# Patient Record
Sex: Male | Born: 1955 | Race: White | Hispanic: No | Marital: Married | Smoking: Never smoker
Health system: Southern US, Community
[De-identification: ages and names within clinical notes are randomized; demographics above are authoritative.]

## PROBLEM LIST (undated history)

## (undated) DIAGNOSIS — I219 Acute myocardial infarction, unspecified: Secondary | ICD-10-CM

## (undated) DIAGNOSIS — E782 Mixed hyperlipidemia: Secondary | ICD-10-CM

## (undated) DIAGNOSIS — I10 Essential (primary) hypertension: Secondary | ICD-10-CM

## (undated) DIAGNOSIS — I2510 Atherosclerotic heart disease of native coronary artery without angina pectoris: Secondary | ICD-10-CM

## (undated) DIAGNOSIS — K219 Gastro-esophageal reflux disease without esophagitis: Secondary | ICD-10-CM

## (undated) DIAGNOSIS — Z87442 Personal history of urinary calculi: Secondary | ICD-10-CM

## (undated) DIAGNOSIS — E119 Type 2 diabetes mellitus without complications: Secondary | ICD-10-CM

## (undated) DIAGNOSIS — F419 Anxiety disorder, unspecified: Secondary | ICD-10-CM

## (undated) HISTORY — DX: Acute myocardial infarction, unspecified: I21.9

## (undated) HISTORY — DX: Essential (primary) hypertension: I10

## (undated) HISTORY — PX: CAROTID STENT: SHX1301

## (undated) HISTORY — DX: Atherosclerotic heart disease of native coronary artery without angina pectoris: I25.10

## (undated) HISTORY — PX: INGUINAL HERNIA REPAIR: SUR1180

## (undated) HISTORY — DX: Mixed hyperlipidemia: E78.2

## (undated) HISTORY — DX: Type 2 diabetes mellitus without complications: E11.9

---

## 2001-11-21 ENCOUNTER — Ambulatory Visit (HOSPITAL_COMMUNITY): Admission: RE | Admit: 2001-11-21 | Discharge: 2001-11-21 | Payer: Self-pay | Admitting: Pulmonary Disease

## 2003-10-22 ENCOUNTER — Inpatient Hospital Stay (HOSPITAL_COMMUNITY): Admission: AD | Admit: 2003-10-22 | Discharge: 2003-10-24 | Payer: Self-pay | Admitting: Internal Medicine

## 2004-04-15 ENCOUNTER — Ambulatory Visit: Payer: Self-pay | Admitting: Cardiology

## 2004-04-25 ENCOUNTER — Ambulatory Visit: Payer: Self-pay | Admitting: Cardiology

## 2004-07-08 ENCOUNTER — Inpatient Hospital Stay (HOSPITAL_BASED_OUTPATIENT_CLINIC_OR_DEPARTMENT_OTHER): Admission: RE | Admit: 2004-07-08 | Discharge: 2004-07-08 | Payer: Self-pay | Admitting: Internal Medicine

## 2004-07-08 ENCOUNTER — Ambulatory Visit: Payer: Self-pay | Admitting: Internal Medicine

## 2004-07-22 ENCOUNTER — Ambulatory Visit: Payer: Self-pay | Admitting: Cardiology

## 2005-08-20 ENCOUNTER — Ambulatory Visit: Payer: Self-pay | Admitting: Cardiology

## 2006-02-02 ENCOUNTER — Ambulatory Visit: Payer: Self-pay | Admitting: Cardiology

## 2006-03-25 ENCOUNTER — Ambulatory Visit: Payer: Self-pay | Admitting: Cardiology

## 2007-01-21 ENCOUNTER — Ambulatory Visit: Payer: Self-pay | Admitting: Cardiology

## 2007-02-04 ENCOUNTER — Ambulatory Visit: Payer: Self-pay | Admitting: Cardiology

## 2007-10-25 ENCOUNTER — Ambulatory Visit (HOSPITAL_COMMUNITY): Admission: RE | Admit: 2007-10-25 | Discharge: 2007-10-25 | Payer: Self-pay | Admitting: Pulmonary Disease

## 2007-12-16 ENCOUNTER — Ambulatory Visit: Payer: Self-pay | Admitting: Cardiology

## 2009-05-14 ENCOUNTER — Ambulatory Visit (HOSPITAL_COMMUNITY): Admission: RE | Admit: 2009-05-14 | Discharge: 2009-05-14 | Payer: Self-pay | Admitting: Pulmonary Disease

## 2009-06-12 ENCOUNTER — Ambulatory Visit (HOSPITAL_COMMUNITY): Admission: RE | Admit: 2009-06-12 | Discharge: 2009-06-12 | Payer: Self-pay | Admitting: Orthopedic Surgery

## 2010-03-30 HISTORY — PX: CARDIAC CATHETERIZATION: SHX172

## 2010-03-30 HISTORY — PX: CORONARY ANGIOPLASTY: SHX604

## 2010-04-15 ENCOUNTER — Encounter: Payer: Self-pay | Admitting: Cardiology

## 2010-05-01 NOTE — Medication Information (Signed)
Summary: RX Folder/ DR. HAWKINS  RX Folder/ DR. HAWKINS   Imported By: Dorise Hiss 04/15/2010 15:51:54  _____________________________________________________________________  External Attachment:    Type:   Image     Comment:   External Document

## 2010-08-12 NOTE — Assessment & Plan Note (Signed)
[facility]                          [facility] NOTE   NAME:[name], [name] STROTHERS                       MRN:          [id number]  DATE:01/21/2007                            DOB:          25-Jan-1956    PRIMARY CARE PHYSICIAN:  [name] [name], M.D.   REASON FOR CONSULTATION:  Nine month followup.   HISTORY OF PRESENT ILLNESS:  [name] [name] is a 55 year old male patient  with a history of coronary artery disease, history of unstable angina,  status post stenting to the circumflex in July, 2005.  He had a drug-  eluting stent at that time, (Taxus).  He had a follow-up cardiac  catheterization as part of a Zomax trial in April, 2006.  He had a  patent stent with 40-50% in-stent restenosis.  He had moderate,  nonobstructive residual disease in the LAD and the circumflex.  He had  an 80-90% lesion in the first obtuse marginal, which was similar to his  previous films.  He also had an 80-90% lesion in the mid RCA which was  small and nondominant.  He has a history of normal LV function.  He  recently underwent lipoma excision from his left thigh.  This apparently  became infected, and he had to have this incised and drained.  He came  off of his Plavix for this procedure.  He did have some bleeding when he  went back on his Plavix, and it was eventually discontinued.  He  recently started back on aspirin about a month ago.   The patient return to the office today for followup.  Overall, he is  doing well.  His left leg wound is healing.  He denies any chest pain.  He denies any exertional chest heaviness or tightness.  He denies any  exertional shortness of breath.  He is NYHA class I-II.  He denies  orthopnea, PND, or pedal edema.  He denies any palpitations or syncope.   CURRENT MEDICATIONS:  1. Coreg 6.25 mg b.i.d.  2. Simvastatin 40 mg nightly.  3. Aspirin 81 mg daily.  4. Janumet 50/500 mg b.i.d.  5. Nitroglycerin p.r.n. chest pain.   ALLERGIES:  No known drug allergies.   PHYSICAL EXAMINATION:  He is a well-developed and well-nourished male in  no distress.  Blood pressure is 160/97, pulse 96, weight 219.2 pounds.  HEENT:  Normal.  NECK:  Without JVD at 45 degrees.  Carotids without bruits bilaterally.  CARDIAC:  Normal S1 and S2.  Regular rate and rhythm without murmurs.  LUNGS:  Clear to auscultation bilaterally without wheezes, rales or  rhonchi.  ABDOMEN:  Soft and nontender with normoactive bowel sounds.  No  organomegaly.  EXTREMITIES:  No edema.  Calves soft and nontender.  SKIN:  Warm and dry.  NEUROLOGIC:  He is alert and oriented x3.  Cranial nerves II-XII are  grossly intact.   Electrocardiogram reveals a sinus rhythm with a heart rate of 88.  Normal axis.  No acute changes.   IMPRESSION:  1. Coronary artery disease.      a.  Status post drug-eluting stent to the distal circumflex in       July, 2005 in the setting of unstable angina.      b.     Residual coronary artery disease, as outlined above.  2. Good left ventricular function.  3. Hypertension.  4. Diabetes mellitus.  5. Treated dyslipidemia.  6. Status post recent left thigh lipoma excision.   PLAN:  Patient presents to the office today for followup.  Overall, he  is doing well.  He denies any symptoms suggestive of angina.  His blood  pressure is uncontrolled.  He is now off Plavix since his recent leg  surgery.  At this point in time, we will plan to:  1. Continue off of Plavix, as he did take this for a total of two      years.  His aspirin will be increased to 325 mg daily.  2. Increase his Coreg to 12.5 mg b.i.d. for better blood pressure      control.  3. Have him follow up in the office with a nurse for a blood pressure      check in two weeks.  At that time, if his blood pressure will      tolerate it, we will initiate an ACE inhibitor.  He would benefit      from this, as he is a diabetic.  I would suggest starting       lisinopril 10 mg daily.  He will need follow-up BMET after that is      initiated.  4. Follow up in this office with Dr. Diona [name] in the next six months      or sooner p.r.n.      [name] Newcomer, PA-C  Electronically Signed      Jonelle Sidle, MD  Electronically Signed   SW/MedQ  DD: 01/21/2007  DT: 01/22/2007  Job #: [id number]   cc:   [name] [name], M.D.

## 2010-08-12 NOTE — Assessment & Plan Note (Signed)
[facility]                          [facility] NOTE   NAME:[name], [name] VICARIO                       MRN:          [id number]  DATE:12/16/2007                            DOB:          09-20-1955    PRIMARY CARE PHYSICIAN:  [name] Dredge L. Juanetta Gosling, MD   REASON FOR VISIT:  Routine cardiac followup.   HISTORY OF PRESENT ILLNESS:  [name] comes back in for a cardiac  visit.  He is not reporting any significant problems with angina or  limiting breathlessness, although he does feel basically fatigued he  admits when he has not been exercising with an irregularity and has also  gained weight over the last few years since his prior intervention.  He  is due to see Dr. Juanetta [name] soon for routine visit.  He has had had some  problems with sinus drainage and cough, but no lower extremity edema or  orthopnea.  I reviewed his medications, which are outlined below.  He  seems to be tolerating these well.  He does report some problems with  hyperglycemia.  He is not reporting any need for sublingual  nitroglycerin use.   ALLERGIES:  No known drug allergies.   PRESENT MEDICATIONS:  1. Aspirin 325 mg p.o. daily.  2. Coreg 12.5 mg p.o. b.i.d.  3. Actos/metformin 15/850 mg p.o. b.i.d.  4. Diovan 320 mg p.o. daily.  5. Humalog sliding scale.  6. Simvastatin 40 mg p.o. nightly.  7. Sublingual nitroglycerin 0.4 mg.   REVIEW OF SYSTEMS:  As per history of present illness.  Otherwise,  negative.   PHYSICAL EXAMINATION:  VITAL SIGNS:  Blood pressure 150/98, rechecked by  me 152/92; heart rate 81; weight 229 pounds, up from 219 pounds at his  last visit.  GENERAL:  This is an obese male in no acute distress.  NECK:  Examination of the neck reveals no elevated jugular venous  pressure.  No audible bruits.  LUNGS:  Clear without labored breathing at rest.  CARDIAC:  Regular rate and rhythm.  No S3, gallop, or pathologic murmur.  ABDOMEN:  Obese, nontender.  EXTREMITY:  Exhibit no frank pitting edema.   IMPRESSION AND RECOMMENDATIONS:  Coronary artery disease status post  drug-eluting stent placement to the distal circumflex in July 2005.  He  does have moderate residual disease within the left anterior descending  and circumflex as well as an 80-90% lesion involving a small nondominant  right coronary artery.  He is not reporting any angina or classic heart  failure symptoms.  He has had some fatigue.  I have recommended that he  get back to a regular exercise regimen, beginning with walking.  My hope  is that with increased activity and weight loss, he may note improvement  in many of his conditions.  Otherwise if he does not improve or begins  to develop angina, we may consider a followup Cardiolite.  We will plan  to see him back over the next 6 months.  Prescription refill for  sublingual nitroglycerin was given.     Jonelle Sidle, MD  Electronically Signed  SGM/MedQ  DD: 12/16/2007  DT: 12/17/2007  Job #: [id number]   cc:   [name] [name], M.D.

## 2010-08-15 NOTE — Assessment & Plan Note (Signed)
[facility]                            [facility] NOTE   NAME:[name] [name], [name] [name]                       MRN:          [id number]  DATE:                                      DOB:          May 07, 1955    PRIMARY CARE PHYSICIAN:  Oneal Deputy. Juanetta Gosling, M.D.   REASON FOR VISIT:  Recent lightheadedness and palpitations.   HISTORY OF PRESENT ILLNESS:  [name] [name] was last in the office back in May.  He has a history of coronary artery disease status post previous myocardial  infarction with subsequent stent placement to the circumflex coronary artery  in July of 2005. He has residual coronary disease including moderate  nonobstructive disease in the left anterior descending as well as an 80%  stenosis in a small nondominant right coronary artery, both of which are  being managed medically. Ejection fraction has been normal. He states that  he has actually been doing fairly well and has been able to lose 30 pounds  over the last several months through exercise and diet. He is in fact  walking up to 4 miles at a time on a treadmill and tolerating this quite  well. Last Thursday he states that he was feeling light-headed and  irregularity to his heart beat also noting it to be in the 60s through the  use of the pulse oximeter. 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 It is also possible that he is having  some Wenckebach conduction or nonconducted PACs. In any event, he noted that  since he has been exercising he does sweat fairly significantly with  activity and was not using any electrolyte replacement drinks. He started  using Propel and since then has not had any major palpitations. Of note, he  is not reporting any exertional chest pain or limiting dyspnea  on exertion  and has had no syncope. We reviewed his medications today and at this point  I have adopted a strategy of observation as well as basic blood work to  exclude major electrolyte abnormalities.   ALLERGIES:  No known drug allergies.   CURRENT MEDICATIONS:  1. Coreg 6.25 mg p.o. b.i.d.  2. Plavix 75 mg p.o. daily.  3. Simvastatin 40 mg p.o. daily.  4. Aspirin 81 mg p.o. daily.  5. Metformin 500 mg p.o. b.i.d.   REVIEW OF SYSTEMS:  As described in the history of present illness.   PHYSICAL EXAMINATION:  VITAL SIGNS:  Blood pressure today is elevated  150/68, heart rate is 72, weight is 200 pounds.  GENERAL:  The patient is comfortable and in no acute distress.  NECK:  Reveals no elevated jugular venous pressure or loud bruits. No  thyromegaly is noted.  LUNGS:  Clear without labored breathing.  CARDIAC:  Reveals a regular rate and rhythm with occasional ectopic beat, no  S3 gallop,  pericardial rub, or loud murmur.  EXTREMITIES:  No significant pitting edema.   IMPRESSION:  1. Recent palpitations and lightheadedness with premature atrial      complexes, possibly some nonconducted, and also prolonged PR interval      with questionable Wenckebach conduction at times. We will obtain a BMET      and magnesium to assess for electrolyte abnormalities. He is otherwise      reporting improvement in his symptoms since using an electrolyte      replacement drink after exercise and is not experiencing any exertional      chest pain or dyspnea, tolerating 4 miles of treadmill work at a time.      We will continue observation and have him followup in the office over      the next 3 weeks to ensure that things stabilize.  2. Known coronary artery disease as discussed above.     Jonelle Sidle, MD  Electronically Signed    SGM/MedQ  DD: 02/02/2006  DT: 02/03/2006  Job #: [id number]   cc:   Ramon Dredge L. Juanetta Gosling, M.D.

## 2010-08-15 NOTE — Discharge Summary (Signed)
NAME:  [name] [name], [name] [name] NO.:  [id number]   MEDICAL RECORD NO.:  [id number]                   PATIENT TYPE:  INP   LOCATION:  6532                                 FACILITY:  [facility]   PHYSICIAN:  Jonelle Sidle, M.D. [facility]        DATE OF BIRTH:  [birth year]   DATE OF ADMISSION:  10/22/2003  DATE OF DISCHARGE:  10/24/2003                           DISCHARGE SUMMARY - REFERRING   PROCEDURE:  Coronary angiogram/stent circumflex artery October 23, 2003.   REASON FOR ADMISSION:  Please refer to Dr. Illene Bolus McDowell's consultation  note for full details.   LABORATORY DATA:  Hemoglobin 14.5, platelets 267 at discharge.  BUN 15,  creatinine 0.9, potassium 3.6 at discharge.  Hemoglobin A1C 12.3.  Serial  cardiac enzymes normal.   Admission chest x-ray:  No acute disease.   HOSPITAL COURSE:  Following initial presentation to [facility] for  evaluation of chest pain and where patient ruled out for MI with negative  cardiac markers, arrangements were made by Dr. Simona Huh for transfer for  diagnostic coronary angiography.   Cardiac catheterization, performed by Dr. Charlies Constable (see report for full  details), revealed significant two vessel coronary artery disease with a  high grade distal circumflex lesion and an 80% small mid RCA stenosis.  The  LAD had 40% lesion of the proximal and mid portion; there was also 90%  second obtuse marginal (small), and 50% mid circumflex disease.  LV function  was normal.   Dr. Juanda Chance proceeded with successful stenting (TAXUS), as part of the ZOMAXX  trial, with no noted complications.   The patient was also started on Zocor and will need follow-up fasting  lipids/liver profile in approximately eight weeks.  The patient also has  newly diagnosed type 2 diabetes mellitus, with a prescription to start  Glucophage at home, and was instructed to start this in 48 hours  postoperatively from discharge.   DISCHARGE  MEDICATIONS:  1. Plavix 75 mg daily (x1 year).  2. Enteric coated aspirin 325 mg daily.  3. Ziac 2.5 mg daily.  4. Zocor 40 mg daily.  5. Glucophage as directed.  6. Nitrostat 0.4 mg p.r.n.   DISCHARGE INSTRUCTIONS:  No heavy lifting/driving x2 days.  Maintain low  fat/cholesterol diet.  Call the office if there is any swelling/bleeding of  the groin.   The patient will follow up with Dr. Doreatha Martin McDowell/[employer], on  Wednesday, November 07, 2003, at 1 p.m., at [facility] [place]   The patient also instructed to follow up with Dr. Shaune Pollack in [place],  [place], as previous scheduled.   DISCHARGE DIAGNOSES:  1. Status post unstable angina pectoris.     a. Negative serial cardiac markers.     b. Status post stent (TAXUS) distal circumflex (ZOMAXX study).     c. Residual second obtuse marginal and right coronary artery disease -  medical therapy recommended.     d. Normal left ventricular function.  2. Type 2 diabetes mellitus.  3. Hypertension.  4. Dyslipidemia.      Gene Serpe, P.A. [facility]                      Jonelle Sidle, M.D. [facility]    GS/MEDQ  D:  10/24/2003  T:  10/24/2003  Job:  [id number]   cc:   Regina Eck, M.D.  Jonita [place], [place]   [facility]  [address]  [address]  [place], [place] [address]

## 2010-08-15 NOTE — Assessment & Plan Note (Signed)
[facility]                          [facility] NOTE   NAME:Lien, DRADEN COTTINGHAM                       MRN:          [id number]  DATE:03/25/2006                            DOB:          04-07-1955    FOLLOW-UP VISIT:  Primary care physician is Oneal Deputy. Juanetta Gosling, M.D.   REASON FOR VISIT:  Cardiac follow-up.   HISTORY OF PRESENT ILLNESS:  I saw Mr. Mundorf back in November.  He had  been experiencing some palpitations and lightheadedness at that time  with some premature atrial complexes and possible Wenckebach conduction  noted.  We did not make any major medication changes but did check  baseline blood work showing normal BUN and creatinine of 14 and 0.8,  normal potassium of 4.4, and a normal magnesium of 2.2.  I encouraged  him to continue with his regular exercise regimen and maintain a well-  hydrated status.  He denies having any problems with exertional chest  pain or dyspnea and states that he actually feels much better now in  follow-up.   ALLERGIES:  No known drug allergies.   PRESENT MEDICATIONS:  1. Coreg 6.25 mg p.o. b.i.d.  2. Plavix 75 mg p.o. daily.  3. Zocor 40 mg p.o. daily.  4. Aspirin 81 mg p.o. daily.  5. Metformin 500 mg p.o. b.i.d.  6. Advil p.r.n.   REVIEW OF SYSTEMS:  As described in history of present illness.  He has  had no syncope.   PHYSICAL EXAMINATION:  VITAL SIGNS:  Blood pressure 144/90, heart rate  68, weight is 195 pounds.  GENERAL:  The patient is comfortable, in no acute distress.  NECK:  No elevated jugular venous pressure, without bruits.  LUNGS:  Clear without labored breathing.  CARDIAC:  A regular rate and rhythm without rub, murmur or gallop.  EXTREMITIES:  No pitting edema.   IMPRESSION:  1. History of coronary artery disease, status post previous stent      placement to the circumflex in July 2005 (in the [facility] trial).  He      had a follow-up catheterization in 2006 which revealed a  patent      stent with 40-50% restenosis, 80-90% stenosis in the midportion of      a small and nondominant right coronary artery, and approximately 50-      60% stenosis of the left anterior descending.  We will plan to      continue his medical therapy and have follow-up over the next      6 months.  No changes to his medical regimen at this time.  2. Further plans to follow.     Jonelle Sidle, MD  Electronically Signed    SGM/MedQ  DD: 03/25/2006  DT: 03/25/2006  Job #: [id number]   cc:   Ramon Dredge L. Juanetta Gosling, M.D.

## 2010-08-15 NOTE — Cardiovascular Report (Signed)
NAME:  [name] [name], [name] [name] NO.:  [id number]   MEDICAL RECORD NO.:  [id number]                   PATIENT TYPE:  INP   LOCATION:  2013                                 FACILITY:  [facility]   PHYSICIAN:  Charlies Constable, M.D. [facility]              DATE OF BIRTH:  [birth year]   DATE OF PROCEDURE:  10/23/2003  DATE OF DISCHARGE:                              CARDIAC CATHETERIZATION   PROCEDURES:  Cardiac catheterization.   CARDIOLOGIST:  Charlies Constable, M.D.   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  He has no prior history of heart  disease, but in church on Sunday, he had an episode of severe substernal  chest tightness with diaphoresis.  He was seen by Arnette Felts and Dr. Simona Huh and admitted to the hospital with the diagnosis of unstable angina.   DESCRIPTION OF PROCEDURE:  The procedure was performed via the right femoral  artery using arterial sheath and 6-French preformed coronary catheters.  A  frontal arterial puncture was performed, and Omnipaque contrast was used.  At completion of the diagnostic study, made decision for intervention on the  distal circumflex artery.   The patient was enrolled din the ZoMax trial and was randomized to ZoMax  study stent.  The patient was given weight-adjusted heparin to prolong the  ACT to greater than 200 seconds and was given double bolus Integrilin and  infusion and 300 mg of Plavix.  We used the CLS-4 6-French guiding catheter  with side holes and a Asahi wire.  We crossed the lesion in the distal  circumflex artery with the wire without too much difficulty.  We pre-dilated  with a 2.25 x 15 mm Maverick, performing two inflations up to 8 atmospheres  for 30 seconds.  We then deployed a 2.5 x 20 mm study stent, deploying this  with one inflation of 11 atmospheres for 30 seconds.   The distal portion of  the stent was not completely expanded with delivery balloon.  We went in and  post dilated with a 2.75 x 15 mm Quantum Maverick in the proximal portion of  the stent, avoiding the proximal edge.  We then post dilated with a 2.5 x 12  mm Quantum Maverick in the distal portion of the stent, performing one  inflation up to 18 atmospheres for 30 seconds.  We were unable to advance  the initial stent down to the distal segment of the stent.  Repeat  diagnostic studies were then performed through the guiding catheter.  The  patient tolerated the procedure well and left the laboratory in satisfactory  condition.   The patient was enrolled in the ZoMax trial.  We initially had planned to  IVUS the stent implantation, but after completing the stent procedure, we  felt that the distal vessel was too short and too small caliber distally to  tolerate the IVUS catheter.  Fro this reason, the IVUS was not performed.   RESULTS:  The aortic pressure was 124/89 with a mean of 106.  The left  ventricular pressure was 124/16.   1. LEFT MAIN CORONARY ARTERY:  The left main coronary artery was free fo     significant disease.  2. LEFT ANTERIOR DESCENDING ARTERY:  The left anterior descending artery     gave rise to a septal perforator, diagonal branch, and three small septal     perforators.  There was 40% proximal and 40% mid stenoses and     irregularities distally.  3. CIRCUMFLEX ARTERY:  The circumflex artery gave rise to a ramus branch, a     small marginal branch, a large posterolateral branch, and a posterior     descending branch.  There was 90% stenosis in the second marginal branch,     but this was a very small vessel.  There was 50% narrowing in the mid to     distal vessel before the posterolateral branch, and there was a 95%     stenosis in the distal vessel before the posterior descending branch.     There was 50% narrowing distally in the posterior descending  branch.  4. RIGHT CORONARY ARTERY:  The right coronary artery was a nondominant     vessel that gave rise to two right ventricular branches.  There was 80%     proximal stenosis.  5. LEFT VENTRICULOGRAM: The left ventriculogram performed in the RAO     projection showed good wall motion with no areas of hypokinesis.   Following stenting of the lesion in the distal circumflex artery, the  stenosis improved from 95% to less than 10%.   CONCLUSION:  1. Coronary artery disease with 40% proximal and 40% mid stenoses in the     left anterior descending artery, 95% stenosis in the distal circumflex     artery (dominant vessel), and 80% stenosis in a small, nondominant right     coronary artery with normal left ventricular function.  2. Successful stenting of the distal circumflex artery using a ZoMax study     drug-eluting tent with improvement in the narrowing from 95% to less than     10%.   DISPOSITION:  The patient returned to the holding area for further  observation.  The patient should remain on Plavix for one year and, as part  of the ZoMax trial, is targeted to have a followup catheterization at nine  months.                                               Charlies Constable, M.D. [facility]    BB/MEDQ  D:  10/23/2003  T:  10/23/2003  Job:  [id number]   cc:   Jonelle Sidle, M.D. [facility]   Carolynn Comment, M.D.   Cardiopulmonary Lab

## 2010-08-15 NOTE — Cardiovascular Report (Signed)
[name] [name], [name] [name]                ACCOUNT NO.:  [id number]   MEDICAL RECORD NO.:  [id number]          PATIENT TYPE:  OIB   LOCATION:  6501                         FACILITY:  [facility]   PHYSICIAN:  Arvilla Meres, M.D. LHCDATE OF BIRTH:  04-19-1955   DATE OF PROCEDURE:  07/08/2004  DATE OF DISCHARGE:                              CARDIAC CATHETERIZATION   REFERRED BY:  Dr. Linna Darner.   PROCEDURES PERFORMED:  1.  Selective coronary angiography.  2.  Left heart catheterization.  3.  Left ventriculogram.  4.  Angio-Seal closure device.   CARDIOLOGIST:  Jonelle Sidle, M.D. and Charlies Constable, M.D.   OPERATOR:  Arvilla Meres, M.D.   INDICATIONS FOR PROCEDURE:  [name] [name] is a 55 year old male with a history  of coronary artery disease, status post percutaneous intervention and  stenting of the left circumflex on October 23, 2003, who returns for a follow-  up angiography as part of the Zomax-X study.   DESCRIPTION OF PROCEDURE:  The risks and benefits of the catheterization  were reviewed with [name] [name].  A consent was signed and placed on the  chart.  The right groin area was prepped and draped in the routine sterile  fashion, and a 6-French arterial sheath was placed using a modified  Seldinger technique.  The standard catheters were used for angiography,  including a pre-formed Judkins JL4 and JR4 and a bent pigtail.  All catheter  exchanges were made over a wire.  There were no apparent complications.  At  the end of the case an angio-Seal closure device was placed in the right  groin, with a successful deployment and no evidence of groin complications.  Of note, per study protocol, 200 mcg of IV nitroglycerin were given prior to  angiography of the left circumflex system.   FINDINGS:  Central aortic pressure:  146/87 with a mean of 114.  LV pressure:  136/9 with an EDP of 15.   CORONARY ANATOMY:  1.  LEFT MAIN CORONARY ARTERY:  The left main coronary artery was normal.  2.   LEFT ANTERIOR DESCENDING CORONARY ARTERY:  The left anterior descending      coronary artery was a long vessel, extending to the apex.  It gave off a      large first diagonal branch and several small septal perforators.  There      is a 30% proximal lesion, followed by a 60% lesion just before the      diagonal.  This area was significant calcified.  There is a 30% mid-      lesion and diffuse distal disease with a 50%-60% lesion at the apex.      There is a 50% lesion in the diagonal.  3.  LEFT CIRCUMFLEX CORONARY ARTERY:  The left circumflex system was a large      system that was dominant.  It gave off a ramus branch, as well as two      marginals and several posterolaterals.  In the distal AV groove      circumflex there was a 50%-60% lesion.  In the ramus  there was a 60%      lesion in the proximal portion.  In the OM-I, which is a small vessel,      there was a tandem 80%-90% lesion which was visualized previously.  In      the distal circumflex, there was evidence of a previously-placed stent.      There was a 50% step-in lesion, followed by a 40%-50% restenosis within      the stent itself.  There was no flow-limiting disease there.  4.  RIGHT CORONARY ARTERY:  The right coronary artery was a small non-      dominant vessel.  There was an 80%-90% lesion in the mid-portion.   LEFT VENTRICULOGRAM:  The left ventriculogram shot in the RAO approach had  an ejection fraction of 60%-65% with no wall motion abnormalities or mitral  regurgitation.   ASSESSMENT:  1.  Left circumflex stent is patent, with a 40%-50% restenosis within the      stent and a 50% step-in.  2.  There is a question of mild progression of proximal left anterior      descending coronary artery disease.   PLAN:  Will plan to review films with Dr. Charlies Constable for further followup.      DB/MEDQ  D:  07/08/2004  T:  07/08/2004  Job:  [id number]   cc:   Dr. Jill Poling, M.D. [facility]   Jonelle Sidle,  M.D. [facility]   [facility]  -  [place], [place]

## 2010-08-15 NOTE — Letter (Signed)
October 19, 2006    Sherilyn Cooter A. Cleotis Nipper, M.D.  [address]  [place], [place] [address]   RE:  BENTLEIGH, STANKUS  MRN:  [id number]  /  DOB:  Feb 20, 1956   Dear Dr. Cleotis Nipper,   I have reviewed the chart on Mr. Tremar Wickens.  This patient is followed  closely in our clinic.  He is a 55 year old male with a history of  coronary artery disease, status post stent placement in July 2005.  The  patient had a follow-up catheterization in 2006 which showed a patent  stent.  I understand he is now scheduled for surgery.  The patient has  been more than a year on Plavix, and Plavix can now temporarily be  discontinued in consideration of surgery.  I would recommend that the  patient gets restarted on Plavix whenever you feel it is appropriate  after his surgery.  The risk for late stent thrombosis in the setting of  withholding briefly Plavix is acceptably low, particularly in light of  the patient needing surgery.   If you have any further questions, please do not hesitate to contact me.    Sincerely,      Learta Codding, MD,FACC  Electronically Signed    GED/MedQ  DD: 10/20/2006  DT: 10/20/2006  Job #: [id number]

## 2010-09-30 ENCOUNTER — Inpatient Hospital Stay (HOSPITAL_COMMUNITY)
Admission: EM | Admit: 2010-09-30 | Discharge: 2010-10-04 | DRG: 246 | Disposition: A | Discharge status: Home / Self Care | Payer: 59 | Source: Ambulatory Visit | Attending: Cardiovascular Disease | Admitting: Cardiovascular Disease

## 2010-09-30 ENCOUNTER — Emergency Department (HOSPITAL_COMMUNITY): Payer: 59

## 2010-09-30 DIAGNOSIS — I2510 Atherosclerotic heart disease of native coronary artery without angina pectoris: Secondary | ICD-10-CM | POA: Diagnosis present

## 2010-09-30 DIAGNOSIS — IMO0001 Reserved for inherently not codable concepts without codable children: Secondary | ICD-10-CM | POA: Diagnosis present

## 2010-09-30 DIAGNOSIS — R079 Chest pain, unspecified: Secondary | ICD-10-CM

## 2010-09-30 DIAGNOSIS — E785 Hyperlipidemia, unspecified: Secondary | ICD-10-CM | POA: Diagnosis present

## 2010-09-30 DIAGNOSIS — R570 Cardiogenic shock: Secondary | ICD-10-CM | POA: Diagnosis present

## 2010-09-30 DIAGNOSIS — I498 Other specified cardiac arrhythmias: Secondary | ICD-10-CM | POA: Diagnosis present

## 2010-09-30 DIAGNOSIS — E669 Obesity, unspecified: Secondary | ICD-10-CM | POA: Diagnosis present

## 2010-09-30 DIAGNOSIS — Z9861 Coronary angioplasty status: Secondary | ICD-10-CM

## 2010-09-30 DIAGNOSIS — I4901 Ventricular fibrillation: Secondary | ICD-10-CM | POA: Diagnosis present

## 2010-09-30 DIAGNOSIS — I10 Essential (primary) hypertension: Secondary | ICD-10-CM | POA: Diagnosis present

## 2010-09-30 DIAGNOSIS — I2109 ST elevation (STEMI) myocardial infarction involving other coronary artery of anterior wall: Principal | ICD-10-CM | POA: Diagnosis present

## 2010-09-30 DIAGNOSIS — I469 Cardiac arrest, cause unspecified: Secondary | ICD-10-CM | POA: Diagnosis present

## 2010-09-30 DIAGNOSIS — Z7982 Long term (current) use of aspirin: Secondary | ICD-10-CM

## 2010-09-30 LAB — CBC
HCT: 46 % (ref 39.0–52.0)
Platelets: 275 10*3/uL (ref 150–400)
Platelets: 214 10*3/uL (ref 150–400)
RDW: 13.2 % (ref 11.5–15.5)
RDW: 13.1 % (ref 11.5–15.5)
WBC: 11.4 10*3/uL — ABNORMAL HIGH (ref 4.0–10.5)
WBC: 10.7 10*3/uL — ABNORMAL HIGH (ref 4.0–10.5)

## 2010-09-30 LAB — COMPREHENSIVE METABOLIC PANEL
AST: 59 U/L — ABNORMAL HIGH (ref 0–37)
Albumin: 3.8 g/dL (ref 3.5–5.2)
Alkaline Phosphatase: 59 U/L (ref 39–117)
BUN: 21 mg/dL (ref 6–23)
CO2: 19 mEq/L (ref 19–32)
Chloride: 100 mEq/L (ref 96–112)
Chloride: 99 mEq/L (ref 96–112)
Creatinine, Ser: 0.9 mg/dL (ref 0.50–1.35)
Creatinine, Ser: 0.74 mg/dL (ref 0.50–1.35)
GFR calc non Af Amer: >60 mL/min (ref >60)
Potassium: 4.6 mEq/L (ref 3.5–5.1)
Total Bilirubin: 0.4 mg/dL (ref 0.3–1.2)
Total Bilirubin: 0.7 mg/dL (ref 0.3–1.2)

## 2010-09-30 LAB — APTT: aPTT: 27 seconds (ref 24–37)

## 2010-09-30 LAB — PROTIME-INR: INR: 0.96 (ref 0.00–1.49)

## 2010-09-30 LAB — POCT I-STAT, CHEM 8
Calcium, Ion: 1.17 mmol/L (ref 1.12–1.32)
Chloride: 105 mEq/L (ref 96–112)
HCT: 50 % (ref 39.0–52.0)
Sodium: 138 mEq/L (ref 135–145)
TCO2: 25 mmol/L (ref 0–100)

## 2010-09-30 LAB — CK TOTAL AND CKMB (NOT AT ARMC): Total CK: 694 U/L — ABNORMAL HIGH (ref 7–232)

## 2010-09-30 LAB — MRSA PCR SCREENING: MRSA by PCR: NEGATIVE

## 2010-09-30 LAB — GLUCOSE, CAPILLARY
Glucose-Capillary: 214 mg/dL — ABNORMAL HIGH (ref 70–99)
Glucose-Capillary: 249 mg/dL — ABNORMAL HIGH (ref 70–99)

## 2010-09-30 LAB — HEMOGLOBIN A1C
Hgb A1c MFr Bld: 8.7 % — ABNORMAL HIGH (ref <5.7)
Mean Plasma Glucose: 203 mg/dL — ABNORMAL HIGH (ref <117)

## 2010-09-30 LAB — LIPID PANEL
LDL Cholesterol: 79 mg/dL (ref 0–99)
Triglycerides: 94 mg/dL (ref <150)
VLDL: 19 mg/dL (ref 0–40)

## 2010-09-30 LAB — POCT ACTIVATED CLOTTING TIME: Activated Clotting Time: 144 seconds

## 2010-09-30 LAB — TSH: TSH: 0.592 u[IU]/mL (ref 0.350–4.500)

## 2010-10-01 ENCOUNTER — Inpatient Hospital Stay (HOSPITAL_COMMUNITY): Payer: 59

## 2010-10-01 DIAGNOSIS — I2109 ST elevation (STEMI) myocardial infarction involving other coronary artery of anterior wall: Secondary | ICD-10-CM

## 2010-10-01 DIAGNOSIS — E1165 Type 2 diabetes mellitus with hyperglycemia: Secondary | ICD-10-CM

## 2010-10-01 LAB — BASIC METABOLIC PANEL
BUN: 21 mg/dL (ref 6–23)
BUN: 21 mg/dL (ref 6–23)
Calcium: 9 mg/dL (ref 8.4–10.5)
Chloride: 99 mEq/L (ref 96–112)
Creatinine, Ser: 0.79 mg/dL (ref 0.50–1.35)
GFR calc Af Amer: >60 mL/min (ref >60)
GFR calc Af Amer: >60 mL/min (ref >60)
GFR calc non Af Amer: >60 mL/min (ref >60)
Potassium: 3.7 mEq/L (ref 3.5–5.1)

## 2010-10-01 LAB — CBC
MCH: 29.3 pg (ref 26.0–34.0)
MCHC: 33.4 g/dL (ref 30.0–36.0)
MCV: 87.6 fL (ref 78.0–100.0)
Platelets: 229 10*3/uL (ref 150–400)
Platelets: 208 10*3/uL (ref 150–400)
RBC: 4.87 MIL/uL (ref 4.22–5.81)
RBC: 4.75 MIL/uL (ref 4.22–5.81)
RDW: 13.4 % (ref 11.5–15.5)
WBC: 8.8 10*3/uL (ref 4.0–10.5)

## 2010-10-01 LAB — CARDIAC PANEL(CRET KIN+CKTOT+MB+TROPI)
CK, MB: 24.4 ng/mL (ref 0.3–4.0)
CK, MB: 7.7 ng/mL (ref 0.3–4.0)
Total CK: 606 U/L — ABNORMAL HIGH (ref 7–232)
Troponin I: 14.13 ng/mL (ref <0.30)
Troponin I: 5.77 ng/mL (ref <0.30)
Troponin I: 5.67 ng/mL (ref <0.30)

## 2010-10-01 LAB — GLUCOSE, CAPILLARY: Glucose-Capillary: 289 mg/dL — ABNORMAL HIGH (ref 70–99)

## 2010-10-02 DIAGNOSIS — I517 Cardiomegaly: Secondary | ICD-10-CM

## 2010-10-02 LAB — BASIC METABOLIC PANEL
BUN: 19 mg/dL (ref 6–23)
CO2: 29 mEq/L (ref 19–32)
Chloride: 99 mEq/L (ref 96–112)
Creatinine, Ser: 0.74 mg/dL (ref 0.50–1.35)
Glucose, Bld: 181 mg/dL — ABNORMAL HIGH (ref 70–99)

## 2010-10-02 LAB — CBC
MCH: 28.6 pg (ref 26.0–34.0)
MCV: 86.8 fL (ref 78.0–100.0)
Platelets: 209 10*3/uL (ref 150–400)
RDW: 13.4 % (ref 11.5–15.5)
WBC: 8.1 10*3/uL (ref 4.0–10.5)

## 2010-10-02 LAB — POCT ACTIVATED CLOTTING TIME: Activated Clotting Time: 226 seconds

## 2010-10-03 LAB — COMPREHENSIVE METABOLIC PANEL
AST: 22 U/L (ref 0–37)
Albumin: 3.2 g/dL — ABNORMAL LOW (ref 3.5–5.2)
Alkaline Phosphatase: 53 U/L (ref 39–117)
Chloride: 99 mEq/L (ref 96–112)
Potassium: 3.6 mEq/L (ref 3.5–5.1)
Total Bilirubin: 0.7 mg/dL (ref 0.3–1.2)

## 2010-10-03 LAB — GLUCOSE, CAPILLARY
Glucose-Capillary: 161 mg/dL — ABNORMAL HIGH (ref 70–99)
Glucose-Capillary: 181 mg/dL — ABNORMAL HIGH (ref 70–99)

## 2010-10-04 DIAGNOSIS — I2109 ST elevation (STEMI) myocardial infarction involving other coronary artery of anterior wall: Secondary | ICD-10-CM

## 2010-10-04 LAB — GLUCOSE, CAPILLARY
Glucose-Capillary: 174 mg/dL — ABNORMAL HIGH (ref 70–99)
Glucose-Capillary: 174 mg/dL — ABNORMAL HIGH (ref 70–99)

## 2010-10-05 ENCOUNTER — Telehealth: Payer: Self-pay | Admitting: Physician Assistant

## 2010-10-05 DIAGNOSIS — R52 Pain, unspecified: Secondary | ICD-10-CM

## 2010-10-05 MED ORDER — HYDROCODONE-ACETAMINOPHEN 5-500 MG PO TABS: 1.0000 | ORAL_TABLET | Freq: Two times a day (BID) | ORAL | Status: AC | PRN

## 2010-10-05 NOTE — Telephone Encounter (Signed)
I discharged patient yesterday.  He forgot to ask for pain medication. He received CPR and defib x 1 for VF arrest. He has occasional pain and took percocet in the hospital.  I have called Vicodin 5/500 mg 1 tab po bid prn, #20, no refills to [facility] in [place], [place] [phone].

## 2010-10-16 NOTE — H&P (Signed)
[name] [name], [name] [name] NO.:  [id number]  MEDICAL RECORD NO.:  [id number]  LOCATION:  2904                         FACILITY:  [facility]  PHYSICIAN:  Veverly Fells. Excell Seltzer, MD  DATE OF BIRTH:  09/07/1955  DATE OF ADMISSION:  09/30/2010 DATE OF DISCHARGE:                             HISTORY & PHYSICAL   CHIEF COMPLAINT:  Chest pain.  HISTORY OF PRESENT ILLNESS:  [name] [name] is a 55 year old gentleman with coronary artery disease and diabetes.  He underwent stenting of the distal circumflex in 2005.  He presented tonight with resting chest pain.  He reported about 2 weeks of chest pain with any level of exertion.  He describes a tightness across his anterior chest.  While in the emergency room, his initial EKG was nondiagnostic for STEMI, but it did suggest and the possibility of lateral injury with mild ST elevation less than 1 mm.  There were inferior ST-segment depressions.  The patient had a VF arrest in the ER and was defibrillated x1.  A code STEMI was then called, and he is brought emergently to the Cardiac Cath Lab.  Upon my arrival in the Cath Lab, the patient complained of ongoing 7/10 chest pain.  He has been vomiting in the emergency room on several occasions, and he was treated with a total of 8 mg of Zofran.  The patient also complained of shortness of breath and sweating.  He has no other acute complaints.  PAST MEDICAL HISTORY: 1. Coronary artery disease with stenting of the distal circumflex in     2005 using a ZoMaxx study stent. 2. Type 2 diabetes. 3. Essential hypertension. 4. Hyperlipidemia. 5. Obesity. 6. History of inguinal hernia repair.  SOCIAL HISTORY:  The patient works as a Youth worker. He is married and lives in [place].  He does not smoke cigarettes or drink alcohol.  FAMILY HISTORY:  Positive for hypertension and obesity.  Negative for premature coronary artery disease.  REVIEW OF SYSTEMS:  Negative except as  per HPI.  PHYSICAL EXAMINATION:  GENERAL:  The patient is an obese male in moderate distress secondary to chest pain.  He is pale and diaphoretic. VITAL SIGNS:  Blood pressure is 129/99, heart rate 78, respiratory rate is 24, oxygen saturation is 96% on 100% nonrebreather. HEENT:  Normal. NECK:  Normal carotid upstrokes.  No bruits.  JVP normal.  No thyromegaly or thyroid nodules. LUNGS:  Clear bilaterally. HEART:  The apex is nonpalpable.  The heart is regular rate and rhythm without murmurs or gallops. ABDOMEN:  Soft, obese, nontender. EXTREMITIES:  No clubbing, cyanosis, or edema.  Peripheral pulses are intact and equal. SKIN:  Diaphoretic without rash. NEUROLOGIC:  Cranial nerves II through XII were intact.  Strengths intact and equal. BACK:  No CVA tenderness.  EKG shows normal sinus rhythm with subtle ST-segment elevation in 1 and aVL, nondiagnostic of STEMI.  There are ST and T-wave changes.  Consider inferior ischemia versus reciprocal changes.  Initial CK-MB is 189 and 2.6.  Troponin is less than 0.3.  Sodium is 139, potassium is 4.1, creatinine is 0.9, BUN is 21, glucose is 272. LFTs show an AST of 43 and  an ALT of 60.  White blood cell count is 11.4, hemoglobin 15.9, platelet count 275,000.  Chest x-ray shows low lung volumes with bibasilar atelectases.  FINAL ASSESSMENT:  This is a 55 year old gentleman with acute coronary syndrome complicated by ventricular fibrillation arrest.  While EKG is nondiagnostic of ST-segment elevation myocardial infarction, it is suggestive of lateral injury and considering the clinical circumstances in this diabetic the patient, he will undergo emergency cardiac catheterization for presumed acute myocardial infarction.  The patient was not given aspirin or thienopyridine drug in the emergency room because of vomiting.  We will likely treat him with heparin and Integrilin if he requires percutaneous coronary intervention and then give  him oral antiplatelet drugs once his nausea and vomiting resolved. His diabetes will be treated with sliding scale insulin and he, otherwise, will receive standard ACS medical therapy.     Veverly Fells. Excell Seltzer, MD     MDC/MEDQ  D:  09/30/2010  T:  09/30/2010  Job:  [id number]  cc:   [name] [name], M.D. [name] Sidle, MD  Electronically Signed by Tonny Bollman MD on 10/16/2010 12:40:24 AM

## 2010-10-16 NOTE — Cardiovascular Report (Signed)
NAMEDAVID, RODRIQUEZ NO.:  [id number]  MEDICAL RECORD NO.:  [id number]  LOCATION:  2904                         FACILITY:  [facility]  PHYSICIAN:  Veverly Fells. Excell Seltzer, MD  DATE OF BIRTH:  1955-12-25  DATE OF PROCEDURE: DATE OF DISCHARGE:                           CARDIAC CATHETERIZATION   PROCEDURES: 1. Left heart catheterization. 2. Selective coronary angiography. 3. Left ventricular angiography. 4. Percutaneous transluminal coronary angioplasty and stenting of the     left anterior descending. 5. Percutaneous transluminal coronary angioplasty of the first     diagonal. 6. Perclose of the right femoral artery.  PROCEDURAL INDICATION:  Mr. Weinreb is a 55 year old gentleman who presented with chest pain and dynamic EKG changes.  He had a V-fib arrest in the emergency room and a code STEMI was called.  He was brought emergently for cardiac cath.  The patient had subtle ST-segment elevation suggestive of acute lateral wall infarction.  Risks and indication of procedure were reviewed with the patient. Emergency consent was obtained.  The right groin was prepped and draped and anesthetized with 1% lidocaine.  Using a front wall puncture, a 6- French sheath was placed in the right femoral artery.  A 6-French XB LAD 3.5-cm guide catheter was inserted for left coronary angiography.  The patient is known to have a small nondominant right coronary artery. Once the angiography was performed, he was given a bolus of heparin and treated with weight-based Integrilin.  He had already received 5000 units of heparin in the emergency room, but his ACT was less than 150. He was given an additional 4000 units.  The coronary angiogram demonstrated critical mid LAD stenosis involving the origin of the second diagonal branch.  The LAD had 95-99% stenosis and the diagonal had 95-99% ostial stenosis.  Once a therapeutic ACT was achieved, a cougar guide wire was advanced across the LAD  lesion into the distal vessel.  A second cougar guidewire was advanced, but I could not access the diagonal.  The LAD was then predilated with a 2.0 x 15 mm Emerge balloon.  It was taken at 10 atmospheres on 2 inflations.  After balloon dilatation, I was able to access the diagonal with a second cougar wire. The diagonal was then dilated with a 2.0 x 15 balloon to 10 atmospheres on 2 inflations.  The LAD was then stented across the diagonal using a 2.75 x 24 mm PROMUS element drug-eluting stent.  The stent was deployed at 14 atmospheres and then was re-dilated at 16 atmospheres.  The midportion of the stent over the lesion was under dilated.  The stent was, therefore, postdilated with a 3.0 x 15 mm [place] TREK which was taken to 18 atmospheres over the mid section of the stent in 16 atmospheres over the proximal stent.  There was an excellent angiographic result with 0% residual stenosis and a nice stepdown off the distal edge of the stent.  The diagonal ostium was compromised but continued to have TIMI 3 flow.  A JR-4 diagnostic catheter was then used to image his right coronary artery and a pigtail catheter was used for left ventriculography.  A pullback gradient across the aortic valve  was recorded.  A Perclose device was used for femoral hemostasis.  The patient tolerated the procedure well.  He did not require any further antiarrhythmic drugs.  He did have more vomiting during the procedure and was given additional Zofran and Phenergan.  PROCEDURAL FINDINGS:  Aortic pressure 138/93 with a mean of 115, left ventricular pressure 139/18.  CORONARY ANGIOGRAPHY: 1. The right coronary artery is a small, nondominant vessel.  There is     an 80-90% mid stenosis. 2. Left main.  Left main is calcified with nonobstructive plaque.     There is no high grade stenosis present.  Left main divides into     the LAD and left circumflex. 3. LAD.  The LAD is extensively calcified to the proximal and      midportion of the mid LAD has high grade 95% stenosis with filling     defects suggestive of thrombus, that defect enters into the second     diagonal branch which also has high grade ostial compromise of 90-     95%.  Further in the diagonal, there is a 90% stenosis.  The     diagonal is a fairly small vessel estimated at 2 mm or less in     diameter.  The mid and distal LAD have diffuse plaquing with about     a 50% stenosis near the apical portion of the LAD. 4. Left circumflex.  The left circumflex is a large, dominant vessel,     it gives off small diffusely diseased first OM.  There is also a     small diffusely diseased intermediate branch.  Both of those     branches have 80-90% stenosis.  There is a large second obtuse     marginal branch that has serial 50% stenosis.  The distal AV groove     circumflex has 80-90% stenosis leading into a diffusely diseased     small PDA branch that also has tandem 80-90% stenosis.  Left ventriculography shows mild anterolateral hypokinesis.  The left ventricular ejection fraction is estimated at 50%.  FINAL ASSESSMENT: 1. Severe left anterior descending/diagonal stenosis treatedsuccessfully with percutaneous coronary intervention using a drug-     eluting stent in the left anterior descending and percutaneous     transluminal coronary angioplasty in the first diagonal. 2. Severe distal left circumflex stenosis. 3. Severe right coronary artery stenosis (nondominant vessel). 4. Mild segmental left ventricular systolic dysfunction.  RECOMMENDATIONS:  The patient will require aggressive post-MI medical therapy.  He will be admitted to the CCU.  Recommend medical treatment for his residual CAD as he has diffuse small vessel diabetic pattern disease.     Veverly Fells. Excell Seltzer, MD     MDC/MEDQ  D:  09/30/2010  T:  09/30/2010  Job:  [id number]  Electronically Signed by Tonny Bollman MD on 10/16/2010 12:40:33 AM

## 2010-10-22 ENCOUNTER — Encounter: Payer: Self-pay | Admitting: *Deleted

## 2010-10-23 ENCOUNTER — Ambulatory Visit (INDEPENDENT_AMBULATORY_CARE_PROVIDER_SITE_OTHER): Payer: 59 | Admitting: Cardiology

## 2010-10-23 ENCOUNTER — Encounter: Payer: Self-pay | Admitting: Cardiology

## 2010-10-23 VITALS — BP 129/79 | HR 85 | Ht 68.0 in | Wt 222.0 lb

## 2010-10-23 DIAGNOSIS — I2510 Atherosclerotic heart disease of native coronary artery without angina pectoris: Secondary | ICD-10-CM

## 2010-10-23 DIAGNOSIS — I10 Essential (primary) hypertension: Secondary | ICD-10-CM | POA: Insufficient documentation

## 2010-10-23 DIAGNOSIS — E785 Hyperlipidemia, unspecified: Secondary | ICD-10-CM | POA: Insufficient documentation

## 2010-10-23 DIAGNOSIS — E782 Mixed hyperlipidemia: Secondary | ICD-10-CM | POA: Insufficient documentation

## 2010-10-23 DIAGNOSIS — E1169 Type 2 diabetes mellitus with other specified complication: Secondary | ICD-10-CM | POA: Insufficient documentation

## 2010-10-23 DIAGNOSIS — I25110 Atherosclerotic heart disease of native coronary artery with unstable angina pectoris: Secondary | ICD-10-CM | POA: Insufficient documentation

## 2010-10-23 NOTE — Progress Notes (Signed)
Clinical Summary [name] [name] is a 55 y.o.male presenting for post hospital followup. I last saw him in the office in September of 2009. History is outlined below. He recently had an acute anterolateral myocardial infarction while working at [employer], fortunately was in house when he had a VF arrest with quick resuscitation and urgent coronary angiography demonstrating progressive CAD principally in the left anterior descending distribution. He underwent DES to the bifurcation of the LAD and diagonal, and was otherwise managed medically for significant diffuse distal disease in the circumflex an RCA. LVEF was preserved.  Since hospital discharge he states that he has been walking, perhaps twice a day, up to a mile at a time on level ground. No angina is reported. He has NYHA class II dyspnea on exertion. Still trying to improve his overall energy. He states he has lost approximately 12 pounds through diet. Indicates compliance with his medications, and no significant bleeding problems on dual antiplatelet therapy.  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  We discussed cardiac rehabilitation. Also continued efforts at weight loss and gradually increasing his exercise.   No Known Allergies  Medication list reviewed.  Past Medical History  Diagnosis Date  . Coronary atherosclerosis of native coronary artery     DES distal circumflex 2005, DES LAD/diagonal bifurcation 7/12  . Type 2 diabetes mellitus   . Essential hypertension, benign   . Mixed hyperlipidemia   . Myocardial infarction     Anterolateral with VF arrest 7/12    Past Surgical History  Procedure Date  . Inguinal hernia repair     Family History  Problem Relation Age of Onset  . Hypertension      Social History [name] [name] reports that he has never smoked. He has never used smokeless tobacco. [name] [name] reports that he does not drink alcohol.  Review of Systems No melena or  hematochezia. Stable appetite. No palpitations or syncope. Otherwise negative.  Physical Examination Filed Vitals:   10/23/10 1322  BP: 129/79  Pulse: 85  Overweight male in no acute distress. HEENT: Conjunctiva and lids normal, affect with moist mucosa. Neck: Supple, no elevated JVP or carotid bruits. Lungs: Clear to auscultation, nonlabored. Cardiac: Regular rate and rhythm, no S3 gallop. Abdomen: Soft, nontender, bowel sounds present, no bruits. Skin: Warm and dry, somewhat pale. Extremities: No pitting edema, distal pulses 1-2+. Musculoskeletal: No kyphosis. Neuropsychiatric: Alert and oriented x3, affect appropriate.   ECG Sinus rhythm with sinus arrhythmia, anterolateral T wave inversions consistent with recent infarct.  Studies Cardiac catheterization 09/30/2010: 1. The right coronary artery is a small, nondominant vessel.  There is       an 80-90% mid stenosis.   2. Left main.  Left main is calcified with nonobstructive plaque.       There is no high grade stenosis present.  Left main divides into       the LAD and left circumflex.   3. LAD.  The LAD is extensively calcified to the proximal and       midportion of the mid LAD has high grade 95% stenosis with filling       defects suggestive of thrombus, that defect enters into the second       diagonal branch which also has high grade ostial compromise of 90-       95%.  Further in the diagonal, there is a 90% stenosis.  The  diagonal is a fairly small vessel estimated at 2 mm or less in       diameter.  The mid and distal LAD have diffuse plaquing with about       a 50% stenosis near the apical portion of the LAD.   4. Left circumflex.  The left circumflex is a large, dominant vessel,       it gives off small diffusely diseased first OM.  There is also a       small diffusely diseased intermediate branch.  Both of those       branches have 80-90% stenosis.  There is a large second obtuse       marginal branch that  has serial 50% stenosis.  The distal AV groove       circumflex has 80-90% stenosis leading into a diffusely diseased       small PDA branch that also has tandem 80-90% stenosis.  Echocardiogram 10/02/2010: - Left ventricle: The cavity size was normal. Wall thickness was     increased in a pattern of mild LVH. Systolic function was normal.     The estimated ejection fraction was in the range of 60% to 65%.     Although no diagnostic regional wall motion abnormality was     identified, this possibility cannot be completely excluded on the     basis of this study. The apex was poorly visualized. Doppler     parameters are consistent with abnormal left ventricular     relaxation (grade 1 diastolic dysfunction).   - Aortic valve: There was no stenosis.   - Aorta: Aortic root borderline dilated. Aortic root dimension: 38mm     (ED).   - Mitral valve: Mildly calcified annulus. No significant     regurgitation.   - Left atrium: The atrium was mildly dilated.   - Right ventricle: The cavity size was normal. Systolic function was     normal.   - Pulmonary arteries: PA peak pressure: 30mm Hg (S).   - Inferior vena cava: The vessel was normal in size; the     respirophasic diameter changes were in the normal range (= 50%);     findings are consistent with normal central venous pressure.   Problem List and Plan

## 2010-10-23 NOTE — Assessment & Plan Note (Addendum)
Symptomatically stable at this point status post recent anterolateral infarct with percutaneous intervention as outlined. LVEF is preserved. He did have a relook angiogram during his hospitalization with no significant change. He seems to be tolerating medical therapy well at this time. We discussed continued gradual increase in activity, also weight loss and diet.

## 2010-10-23 NOTE — Patient Instructions (Signed)
**Note De-Identified Cherre Kothari Obfuscation** Your physician recommends that you continue on your current medications as directed. Please refer to the Current Medication list given to you today.  Your physician recommends that you return for lab work in: 6 weeks, just before next office visit  Your physician recommends that you schedule a follow-up appointment in: 6 weeks

## 2010-10-23 NOTE — Assessment & Plan Note (Signed)
Check followup fasting lipid profile and liver function tests prior to next visit.

## 2010-10-23 NOTE — Assessment & Plan Note (Signed)
Blood pressure has been reasonably well-controlled, systolics in the 120s to 130s. No changes made to present regimen.

## 2010-10-29 NOTE — Cardiovascular Report (Signed)
NAMEROMIN, DIVITA NO.:  [id number]  MEDICAL RECORD NO.:  [id number]  LOCATION:  2904                         FACILITY:  [facility]  PHYSICIAN:  Corky Crafts, MDDATE OF BIRTH:  [birth year]  DATE OF PROCEDURE:  10/01/2010 DATE OF DISCHARGE:                           CARDIAC CATHETERIZATION   PRIMARY CARDIOLOGIST:  Veverly Fells. Excell Seltzer, MD  PROCEDURES PERFORMED:  Coronary angiogram, IVUS to the LAD.  OPERATOR:  Corky Crafts, MD  INDICATIONS:  Unstable angina.  PROCEDURE NARRATIVE:  The risks and benefits of cardiac catheterization were explained to the patient, and informed consent was obtained.  He was brought to the cath lab.  He was prepped and draped in usual sterile fashion.  His right wrist was infiltrated with 1% lidocaine.  A 6-French glide sheath was placed into the right radial artery using modified Seldinger technique.  Left coronary artery angiography was performed using a JL-3.5 catheter.  The catheter was advanced to the vessel ostium under fluoroscopic guidance.  Digital angiography was performed in multiple projections using hand injection of contrast.  The IVUS was then performed using a CLS 3.0 6-French catheter.  Heparin was used for anticoagulation, and Prowater was placed down the vessel.  IVUS catheter was advanced.  Several doses of intracoronary nitroglycerin were given to treat vasospasm.  The IVUS was removed.  Final angiography was performed.  Findings will be as noted below.  FINDINGS: 1. The RCA was not injected. 2. Left main was widely patent. 3. Left circumflex was a large dominant vessel.  The OM-1 was a small     vessel with mild-to-moderate disease.  The OM-2 was a small vessel     with a 90% midvessel lesion.  The OM-III was a large vessel with     mild irregularities.  The left PDA had a severe 90% in-stent     restenosis.  This is a small vessel. 4. Left anterior descending had a patent midvessel stent.  At  the     distal edge, there was some irregularity.  In the very distal LAD,     there was 80-90% distal diffuse disease.  The first diagonal which     had been covered by the stent had an ostial hazy 50% lesion.  In     the mid vessel, there was a visible dissection but normal flow.     This was approximately 80% stenosis.  There was TIMI-III flow     throughout the LAD and the diagonal. 5. The IVUS showed no dissection of the distal stent edge.  There was     moderate diffuse disease proximal to the stent but no areas that     were less than 4 millimeter squared cross-sectional area.  IMPRESSION: 1. Small vessel disease in the distal circumflex system and distal     LAD. 2. Patent LAD stent. 3. No significant disease by IVUS in the previously stented area or     just proximal.  RECOMMENDATIONS:  Continue medical therapy.     Corky Crafts, MD     JSV/MEDQ  D:  10/01/2010  T:  10/02/2010  Job:  [id number]  Electronically Signed by  Lance Muss MD on 10/29/2010 01:15:32 PM

## 2010-11-05 ENCOUNTER — Telehealth: Payer: Self-pay | Admitting: Cardiology

## 2010-11-05 NOTE — Telephone Encounter (Signed)
Pt called for a new letter to return to work. Pt stated Dr.McDowell told him if needed he could have a not when he felt he was ready back.Pt would like to have nurse to return a call today.

## 2010-11-11 ENCOUNTER — Encounter (HOSPITAL_COMMUNITY): Payer: Self-pay

## 2010-11-11 ENCOUNTER — Encounter (HOSPITAL_COMMUNITY)
Admission: RE | Admit: 2010-11-11 | Discharge: 2010-11-11 | Disposition: A | Discharge status: Home / Self Care | Payer: 59 | Source: Ambulatory Visit | Attending: Cardiology | Admitting: Cardiology

## 2010-11-11 DIAGNOSIS — I252 Old myocardial infarction: Secondary | ICD-10-CM | POA: Insufficient documentation

## 2010-11-11 DIAGNOSIS — Z5189 Encounter for other specified aftercare: Secondary | ICD-10-CM | POA: Insufficient documentation

## 2010-11-11 DIAGNOSIS — I2510 Atherosclerotic heart disease of native coronary artery without angina pectoris: Secondary | ICD-10-CM | POA: Insufficient documentation

## 2010-11-11 DIAGNOSIS — Z9861 Coronary angioplasty status: Secondary | ICD-10-CM | POA: Insufficient documentation

## 2010-11-11 NOTE — Patient Instructions (Signed)
During orientation advised patient on arrival and appointment times what to wear, what to do before, during and after exercise. Reviewed attendance and class policy. Talked about inclement weather and class consultation policy.   

## 2010-11-11 NOTE — Progress Notes (Signed)
Orientation complete. Pt is scheduled to start on Monday 11/17/10 at 8:15. Pt is registered and records have been retrieved. Pt insurance has been verified. Pt is eager to get started.

## 2010-11-17 ENCOUNTER — Encounter (HOSPITAL_COMMUNITY)
Admission: RE | Admit: 2010-11-17 | Discharge: 2010-11-17 | Disposition: A | Discharge status: Home / Self Care | Payer: 59 | Source: Ambulatory Visit | Attending: Cardiology | Admitting: Cardiology

## 2010-11-19 ENCOUNTER — Encounter (HOSPITAL_COMMUNITY)
Admission: RE | Admit: 2010-11-19 | Discharge: 2010-11-19 | Disposition: A | Discharge status: Home / Self Care | Payer: 59 | Source: Ambulatory Visit | Attending: Cardiology | Admitting: Cardiology

## 2010-11-19 NOTE — Discharge Summary (Signed)
[name] [name], [name] [name] NO.:  [id number]  MEDICAL RECORD NO.:  [id number]  LOCATION:  3740                         FACILITY:  [facility]  PHYSICIAN:  Jesse Sans. Idelia Caudell, MD, FACCDATE OF BIRTH:  02/19/1956  DATE OF ADMISSION:  09/30/2010 DATE OF DISCHARGE:  10/04/2010                              DISCHARGE SUMMARY   CARDIOLOGIST:  Jonelle Sidle, MD  REASON FOR ADMISSION:  Anterolateral ST-elevation myocardial infarction complicated by V-fib arrest  DISCHARGE DIAGNOSES: 1. Coronary artery disease.     a.     Status post drug-eluting stent to the left anterior      descending artery diagonal bifurcation in the setting of      anterolateral infarct this admission.     b.     Severe distal left circumflex and severe right coronary      artery stenosis - medical therapy.     c.     Preserved left ventricular function with an ejection      fraction of 60%-65%. 2. Diabetes mellitus type 2, uncontrolled.3. Hypertension. 4. Hyperlipidemia.  PROCEDURE PERFORMED THIS ADMISSION:  1. Cardiac catheterization percutaneous coronary intervention by Dr.     Tonny Bollman on September 30, 2010:  Small dominant RCA mid 80%-90%     CFX with small diffusely diseased OM1, intermediate branch with 80%-90%,      OM2 with serial 50% stenoses, distal AV groove CFX 80%-90% leading into a diffusely     diseased small PDA with tandem 80%-90% stenoses; LAD calcified     proximal and mid with high-grade 95% stenosis suggestive of     thrombus, entering into the second diagonal branch with ostial     compromise of 90%-95%, further in the diagonal 90%, mid-to-distal     LAD 50% near the apical portion of the LAD; EF 50% with mild     anterolateral hypokinesis  2.  PCI:  Promus drug-eluting stent to the LAD diagonal bifurcation.  3.  Relook catheterization on October 01, 2010, by Dr. Eldridge Dace:  Small vessel     disease in distal circumflex and distal LAD, patent LAD stent, no     significant disease  by IVUS in a previously stented area or just     proximal - medical therapy continued.  4. Echocardiogram October 02, 2010:  Mild LVH, EF 60%-65%, grade 1     diastolic dysfunction, borderline dilated aortic root (38 mm),     mildly dilated left atrium, PAST 30.  ADMISSION HISTORY:  Mr. Waldrop is a 55 year old male with prior history of CAD, status post ZoMaxx study stent to the distal circumflex in 2005, diabetes, hypertension, and hyperlipidemia, who presented to the emergency room on the day of admission with chest pain.  His EKG demonstrated borderline ST elevation suggestive of lateral infarct.  The patient suffered a V-fib arrest in the emergency room.  He was defibrillated x1 and he was taken emergently to the cardiac catheterization lab.  HOSPITAL COURSE:  As noted, the patient was taken emergently to the cardiac catheterization lab.  He underwent stenting of the LAD diagonal bifurcation with a Promus drug-eluting stent.  His diffuse distal disease in the  circumflex and disease in the small dominant RCA was to be treated medically.  His EF was preserved.  He did develop acute volume overload and he was diuresed.  On October 01, 2010, he developed an unresponsive episode with hypotension with blood pressures in the 70s and decreased heart rate to the 50s.  Initially, code blue was called. However, the patient remained responsive after this episode.  It was decided to take him back to the cardiac catheterization lab for relook. IVUS demonstrated no dissection.  Medical therapy was continued.  The patient was noted to have uncontrolled diabetes.  His hemoglobin A1c was 8.7.  He was seen by diabetes coordinator.  Lantus was added to his medical regimen.  His medications were adjusted and ACE inhibitor was added to his regimen.  It was recommended that he be on Effient in addition to aspirin.  He was placed on high-dose statin.  Therefore, at discharge, his Lipitor was adjusted up to 80 mg  daily.  He developed sinus tachycardia.  His beta-blocker dosage was gradually increased back to his usual home dose.  On the morning of October 04, 2010, he was found to be in stable condition.  It was felt that he was ready for discharge to home.  He will need followup in the [facility] with Dr. Diona Browner in the next couple of weeks.  LABORATORY AND ANCILLARY DATA:  Hemoglobin 13.  Potassium 3.7, creatinine 0.74.  AST 59, ALT 67.  Hemoglobin A1c 8.7.  Peak troponin 14.13, peak CK-MB 39.6, total cholesterol 130, triglycerides 94, HDL 33, LDL 79.  TSH 0.592.  Chest x-ray, no acute disease.  DISCHARGE MEDICATIONS: 1. Aspirin 81 mg daily. 2. Lantus 10 units at bedtime. 3. Lisinopril 10 mg twice daily 4. Effient 10 mg daily. 5. Lipitor 80 mg daily. 6. Actoplus Met 15/850 mg daily with food. 7. Carvedilol 12.5 mg b.i.d. 8. Victoza 18 mg/3 mL 1.8 mg subcu daily.  ALLERGIES:  No known drug allergies.  ACTIVITIES:  He is to increase activity slowly.  He should remain out of work until he is seen back in followup with Dr. Diona Browner.  He may shower.  No lifting, driving, or sexual activity for 2 weeks.  DIET:  Low-fat, low-sodium, diabetic diet.  WOUND CARE:  He should call our office in [facility] for any swelling, bleeding, bruising, or fever.  FOLLOWUP:  With Dr. Diona Browner in the [facility] in 2 weeks and the office will contact him with an appointment.  He should have a followup basic metabolic panel given initiation of ACE inhibitor.  He should also have followup lipids and LFTs in 6-8 weeks given the increase in his statin dose.  Total physician and PA time greater than 30 minutes.     Tereso Newcomer, PA-C   ______________________________ Jesse Sans Daleen Squibb, MD, [facility]    SW/MEDQ  D:  10/04/2010  T:  10/04/2010  Job:  [id number]  cc:   Jonelle Sidle, MD  Electronically Signed by Tereso Newcomer PA-C on 10/23/2010 12:43:54 PM Electronically Signed by Valera Castle MD [facility] on 11/19/2010  06:30:22 PM

## 2010-11-21 ENCOUNTER — Encounter (HOSPITAL_COMMUNITY)
Admission: RE | Admit: 2010-11-21 | Discharge: 2010-11-21 | Disposition: A | Discharge status: Home / Self Care | Payer: 59 | Source: Ambulatory Visit | Attending: Cardiology | Admitting: Cardiology

## 2010-11-24 ENCOUNTER — Other Ambulatory Visit: Payer: Self-pay | Admitting: Cardiology

## 2010-11-24 ENCOUNTER — Encounter (HOSPITAL_COMMUNITY)
Admission: RE | Admit: 2010-11-24 | Discharge: 2010-11-24 | Disposition: A | Discharge status: Home / Self Care | Payer: 59 | Source: Ambulatory Visit | Attending: Cardiology | Admitting: Cardiology

## 2010-11-25 LAB — LIPID PANEL
Cholesterol: 76 mg/dL (ref 0–200)
HDL: 19 mg/dL — ABNORMAL LOW (ref >39)
Triglycerides: 103 mg/dL (ref <150)

## 2010-11-25 LAB — HEPATIC FUNCTION PANEL
ALT: 17 U/L (ref 0–53)
AST: 15 U/L (ref 0–37)
Albumin: 4.4 g/dL (ref 3.5–5.2)
Alkaline Phosphatase: 56 U/L (ref 39–117)
Bilirubin, Direct: 0.2 mg/dL (ref 0.0–0.3)
Indirect Bilirubin: 0.5 mg/dL (ref 0.0–0.9)
Total Bilirubin: 0.7 mg/dL (ref 0.3–1.2)
Total Protein: 6.6 g/dL (ref 6.0–8.3)

## 2010-11-26 ENCOUNTER — Ambulatory Visit (INDEPENDENT_AMBULATORY_CARE_PROVIDER_SITE_OTHER): Payer: 59 | Admitting: Cardiology

## 2010-11-26 ENCOUNTER — Other Ambulatory Visit: Payer: Self-pay | Admitting: Cardiology

## 2010-11-26 ENCOUNTER — Encounter (HOSPITAL_COMMUNITY)
Admission: RE | Admit: 2010-11-26 | Discharge: 2010-11-26 | Disposition: A | Discharge status: Home / Self Care | Payer: 59 | Source: Ambulatory Visit | Attending: Cardiology | Admitting: Cardiology

## 2010-11-26 ENCOUNTER — Encounter: Payer: Self-pay | Admitting: Cardiology

## 2010-11-26 VITALS — BP 141/88 | HR 88 | Resp 18 | Ht 68.0 in | Wt 221.0 lb

## 2010-11-26 DIAGNOSIS — R002 Palpitations: Secondary | ICD-10-CM | POA: Insufficient documentation

## 2010-11-26 DIAGNOSIS — I2510 Atherosclerotic heart disease of native coronary artery without angina pectoris: Secondary | ICD-10-CM

## 2010-11-26 MED ORDER — ATORVASTATIN CALCIUM 80 MG PO TABS: 40.0000 mg | ORAL_TABLET | Freq: Every day | ORAL | Status: DC

## 2010-11-26 NOTE — Progress Notes (Signed)
Clinical Summary [name] [name] is a 55 y.o.male presenting for followup.  He recently had an acute anterolateral myocardial infarction while working at [employer], fortunately was in house when he had a VF arrest with quick resuscitation and urgent coronary angiography demonstrating progressive CAD principally in the left anterior descending distribution. He underwent DES to the bifurcation of the LAD and diagonal, and was otherwise managed medically for significant diffuse distal disease in the circumflex an RCA. LVEF was preserved.  The patient started cardiac rehabilitation last week and has been doing well. He did notice some palpitations this morning and brought the rhythm strips from cardiac rehabilitation that showed PACs and PVCs, and some wandering atrial beats. He states he fell he had some irregularity but hadn't noticed it in a long time, until this morning.He denies any dizziness or presyncope associated with this. He denies any chest pain, dyspnea, dyspnea on exertion, or syncope.  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 There are not any openings at this time. No Known Allergies  Medication list reviewed.  Past Medical History  Diagnosis Date  . Coronary atherosclerosis of native coronary artery     DES distal circumflex 2005, DES LAD/diagonal bifurcation 7/12  . Type 2 diabetes mellitus   . Essential hypertension, benign   . Mixed hyperlipidemia   . Myocardial infarction     Anterolateral with VF arrest 7/12  . Diabetes mellitus     Past Surgical History  Procedure Date  . Inguinal hernia repair   . Cardiac catheterization   . Coronary angioplasty   . Carotid stent     Family History  Problem Relation Age of Onset  . Hypertension    . Hyperlipidemia Mother   . Hypertension Mother   . Hyperlipidemia Father    . Hypertension Father     Social History Mr. Scurlock reports that he has never smoked. He has never used smokeless tobacco. Mr. Boedecker reports that he does not drink alcohol.  Review of Systems See history of present illness, Otherwise negative.  Physical Examination Filed Vitals:   11/26/10 1456  BP: 141/88  Pulse: 88  Resp: 18  Overweight male in no acute distress. HEENT: Conjunctiva and lids normal, affect with moist mucosa. Neck: Supple, no elevated JVP or carotid bruits. Lungs: Clear to auscultation, nonlabored. Cardiac: Regular rate and rhythm, no S3 gallop. Abdomen: Soft, nontender, bowel sounds present, no bruits. Skin: Warm and dry, somewhat pale. Extremities: No pitting edema, distal pulses 1-2+. Musculoskeletal: No kyphosis. Neuropsychiatric: Alert and oriented x3, affect appropriate.   ECG Sinus rhythm with sinus arrhythmia, anterolateral T wave inversions consistent with recent infarct.  Studies Cardiac catheterization 09/30/2010: 1. The right coronary artery is a small, nondominant vessel.  There is       an 80-90% mid stenosis.   2. Left main.  Left main is calcified with nonobstructive plaque.       There is no high grade stenosis present.  Left main divides into       the LAD and left circumflex.   3. LAD.  The LAD is extensively calcified to the proximal and       midportion of the mid LAD has high grade 95% stenosis with filling       defects suggestive of thrombus, that defect enters  into the second       diagonal branch which also has high grade ostial compromise of 90-       95%.  Further in the diagonal, there is a 90% stenosis.  The       diagonal is a fairly small vessel estimated at 2 mm or less in       diameter.  The mid and distal LAD have diffuse plaquing with about       a 50% stenosis near the apical portion of the LAD.   4. Left circumflex.  The left circumflex is a large, dominant vessel,       it gives off small diffusely diseased  first OM.  There is also a       small diffusely diseased intermediate branch.  Both of those       branches have 80-90% stenosis.  There is a large second obtuse       marginal branch that has serial 50% stenosis.  The distal AV groove       circumflex has 80-90% stenosis leading into a diffusely diseased       small PDA branch that also has tandem 80-90% stenosis.  Echocardiogram 10/02/2010: - Left ventricle: The cavity size was normal. Wall thickness was     increased in a pattern of mild LVH. Systolic function was normal.     The estimated ejection fraction was in the range of 60% to 65%.     Although no diagnostic regional wall motion abnormality was     identified, this possibility cannot be completely excluded on the     basis of this study. The apex was poorly visualized. Doppler     parameters are consistent with abnormal left ventricular     relaxation (grade 1 diastolic dysfunction).   - Aortic valve: There was no stenosis.   - Aorta: Aortic root borderline dilated. Aortic root dimension: 38mm     (ED).   - Mitral valve: Mildly calcified annulus. No significant     regurgitation.   - Left atrium: The atrium was mildly dilated.   - Right ventricle: The cavity size was normal. Systolic function was     normal.   - Pulmonary arteries: PA peak pressure: 30mm Hg (S).   - Inferior vena cava: The vessel was normal in size; the     respirophasic diameter changes were in the normal range (= 50%);     findings are consistent with normal central venous pressure.   Problem List and Plan

## 2010-11-26 NOTE — Patient Instructions (Signed)
Your physician recommends that you schedule a follow-up appointment in: 2 mths  Your physician recommends that you return for lab work in: BMP today and Lipid and Liver profile in 6 weeks

## 2010-11-26 NOTE — Assessment & Plan Note (Addendum)
Patient noticed an irregular heartbeat this morning and it was noted at cardiac rehabilitation. He had PACs, PVCs, and wandering atrial pacemaker. He is not dizzy with this and states that he's had this most of his life. We will check a potassium today. He will continue to be monitored at cardiac rehabilitation. If he continues to have problems we can put a monitor on him. Rhythm strips were reviewed by Dr. Diona Browner.

## 2010-11-26 NOTE — Assessment & Plan Note (Signed)
Patient's Lipitor will be decreased to 40 mg daily because his LDL dropped to 36 and HDL drop to 19. We will follow up lipid profile and LFTs in 6 weeks. This was reviewed with Dr. Diona Browner.

## 2010-11-26 NOTE — Assessment & Plan Note (Addendum)
Patient suffered an acute anterolateral MI with V. Fib arrest in July 2004 treated with drug-eluting stent to the LAD diagonal bifurcation. He is doing well without recurrent chest pain. He did start cardiac rehabilitation last week and is doing well.

## 2010-11-27 LAB — BASIC METABOLIC PANEL
CO2: 27 mEq/L (ref 19–32)
Calcium: 9.6 mg/dL (ref 8.4–10.5)
Creat: 0.93 mg/dL (ref 0.50–1.35)

## 2010-11-28 ENCOUNTER — Encounter (HOSPITAL_COMMUNITY)
Admission: RE | Admit: 2010-11-28 | Discharge: 2010-11-28 | Disposition: A | Discharge status: Home / Self Care | Payer: 59 | Source: Ambulatory Visit | Attending: Cardiology | Admitting: Cardiology

## 2010-12-01 ENCOUNTER — Encounter (HOSPITAL_COMMUNITY): Payer: 59

## 2010-12-03 ENCOUNTER — Encounter (HOSPITAL_COMMUNITY)
Admission: RE | Admit: 2010-12-03 | Discharge: 2010-12-03 | Disposition: A | Discharge status: Home / Self Care | Payer: 59 | Source: Ambulatory Visit | Attending: Cardiology | Admitting: Cardiology

## 2010-12-03 DIAGNOSIS — I2510 Atherosclerotic heart disease of native coronary artery without angina pectoris: Secondary | ICD-10-CM | POA: Insufficient documentation

## 2010-12-03 DIAGNOSIS — Z9861 Coronary angioplasty status: Secondary | ICD-10-CM | POA: Insufficient documentation

## 2010-12-03 DIAGNOSIS — I252 Old myocardial infarction: Secondary | ICD-10-CM | POA: Insufficient documentation

## 2010-12-03 DIAGNOSIS — Z5189 Encounter for other specified aftercare: Secondary | ICD-10-CM | POA: Insufficient documentation

## 2010-12-05 ENCOUNTER — Encounter (HOSPITAL_COMMUNITY)
Admission: RE | Admit: 2010-12-05 | Discharge: 2010-12-05 | Disposition: A | Discharge status: Home / Self Care | Payer: 59 | Source: Ambulatory Visit | Attending: Cardiology | Admitting: Cardiology

## 2010-12-08 ENCOUNTER — Encounter (HOSPITAL_COMMUNITY)
Admission: RE | Admit: 2010-12-08 | Discharge: 2010-12-08 | Disposition: A | Discharge status: Home / Self Care | Payer: 59 | Source: Ambulatory Visit | Attending: Cardiology | Admitting: Cardiology

## 2010-12-08 ENCOUNTER — Encounter: Payer: Self-pay | Admitting: Cardiology

## 2010-12-10 ENCOUNTER — Encounter (HOSPITAL_COMMUNITY)
Admission: RE | Admit: 2010-12-10 | Discharge: 2010-12-10 | Disposition: A | Discharge status: Home / Self Care | Payer: 59 | Source: Ambulatory Visit | Attending: Cardiology | Admitting: Cardiology

## 2010-12-12 ENCOUNTER — Ambulatory Visit (INDEPENDENT_AMBULATORY_CARE_PROVIDER_SITE_OTHER): Payer: 59 | Admitting: Adult Health

## 2010-12-12 ENCOUNTER — Encounter: Payer: Self-pay | Admitting: *Deleted

## 2010-12-12 ENCOUNTER — Encounter (HOSPITAL_COMMUNITY)
Admission: RE | Admit: 2010-12-12 | Discharge: 2010-12-12 | Disposition: A | Discharge status: Home / Self Care | Payer: 59 | Source: Ambulatory Visit | Attending: Cardiology | Admitting: Cardiology

## 2010-12-12 ENCOUNTER — Encounter: Payer: Self-pay | Admitting: Adult Health

## 2010-12-12 VITALS — BP 136/85 | HR 77 | Resp 18 | Ht 68.0 in | Wt 219.0 lb

## 2010-12-12 DIAGNOSIS — R002 Palpitations: Secondary | ICD-10-CM

## 2010-12-12 NOTE — Patient Instructions (Signed)
Your physician recommends that you schedule a follow-up appointment in: after your monitor with Dr Diona Browner  Your physician has recommended that you wear an event monitor. Event monitors are medical devices that record the heart's electrical activity. Doctors most often [place] these monitors to diagnose arrhythmias. Arrhythmias are problems with the speed or rhythm of the heartbeat. The monitor is a small, portable device. You can wear one while you do your normal daily activities. This is usually used to diagnose what is causing palpitations/syncope (passing out).

## 2010-12-12 NOTE — Assessment & Plan Note (Signed)
Will plan on cardionet monitor for a minimum of 7 days to ascertain for frequency of arrhythmias and duration.  He is willing to wear this. This may be post inflammatory changes s/p MI causing this. Do not want to increase BB at this time until cardionet monitor has been read.  He will follow-up with Dr. Diona Browner in 2 weeks. 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

## 2010-12-12 NOTE — Progress Notes (Signed)
Clinical Summary [name] [name] is a 55 y.o.male presenting for followup.  He recently had an acute anterolateral myocardial infarction while working at [employer], fortunately was in house when he had a VF arrest with quick resuscitation and urgent coronary angiography demonstrating progressive CAD principally in the left anterior descending distribution. He underwent DES to the bifurcation of the LAD and diagonal, and was otherwise managed medically for significant diffuse distal disease in the circumflex an RCA. LVEF was preserved.  The patient started cardiac rehabilitation last week and has been doing well. He did notice some palpitations this morning and brought the rhythm strips from cardiac rehabilitation that showed PACs and PVCs, and some wandering atrial beats. He states he fell he had some irregularity but hadn't noticed it in a long time, until this morning.He denies any dizziness or presyncope associated with this. He denies any chest pain, dyspnea, dyspnea on exertion, or syncope.  He was cardiac rehab this week and has been experiencing more palpitations.  Cardiac monitor during exercise demonstrated frequent PAC"s, occasional PVC's. He was asymptomatic with these but has become concerned about the frequency. He states he feels this at home as well at rest and with activity. He denies any chest pain or shortness of breath. No Known Allergies  Medication list reviewed.  Past Medical History  Diagnosis Date  . Coronary atherosclerosis of native coronary artery     DES distal circumflex 2005, DES LAD/diagonal bifurcation 7/12  . Type 2 diabetes mellitus   . Essential hypertension, benign   . Mixed hyperlipidemia   . Myocardial infarction     Anterolateral with VF arrest 7/12  . Diabetes mellitus     Past Surgical History  Procedure Date  . Inguinal hernia repair   . Cardiac catheterization   . Coronary angioplasty   . Carotid stent     Family History  Problem Relation Age of  Onset  . Hypertension    . Hyperlipidemia Mother   . Hypertension Mother   . Hyperlipidemia Father   . Hypertension Father     Social History Mr. Symes reports that he has never smoked. He has never used smokeless tobacco. Mr. Spooner reports that he does not drink alcohol.  Review of Systems See history of present illness, Otherwise negative.  Physical Examination Filed Vitals:   12/12/10 1530  BP: 136/85  Pulse: 77  Resp: 18  Overweight male in no acute distress. HEENT: Conjunctiva and lids normal, affect with moist mucosa. Neck: Supple, no elevated JVP or carotid bruits. Lungs: Clear to auscultation, nonlabored. Cardiac: Regular rate and rhythm, no S3 gallop. Abdomen: Soft, nontender, bowel sounds present, no bruits. Skin: Warm and dry, somewhat pale. Extremities: No pitting edema, distal pulses 1-2+. Musculoskeletal: No kyphosis. Neuropsychiatric: Alert and oriented x3, affect appropriate.   ECG Sinus rhythm with sinus arrhythmia, anterolateral T wave inversions consistent with recent infarct.  Studies Cardiac catheterization 09/30/2010: 1. The right coronary artery is a small, nondominant vessel.  There is       an 80-90% mid stenosis.   2. Left main.  Left main is calcified with nonobstructive plaque.       There is no high grade stenosis present.  Left main divides into       the LAD and left circumflex.   3. LAD.  The LAD is extensively calcified to the proximal and       midportion of the mid LAD has high grade 95% stenosis with filling  defects suggestive of thrombus, that defect enters into the second       diagonal branch which also has high grade ostial compromise of 90-       95%.  Further in the diagonal, there is a 90% stenosis.  The       diagonal is a fairly small vessel estimated at 2 mm or less in       diameter.  The mid and distal LAD have diffuse plaquing with about       a 50% stenosis near the apical portion of the LAD.   4. Left  circumflex.  The left circumflex is a large, dominant vessel,       it gives off small diffusely diseased first OM.  There is also a       small diffusely diseased intermediate branch.  Both of those       branches have 80-90% stenosis.  There is a large second obtuse       marginal branch that has serial 50% stenosis.  The distal AV groove       circumflex has 80-90% stenosis leading into a diffusely diseased       small PDA branch that also has tandem 80-90% stenosis.  Echocardiogram 10/02/2010: - Left ventricle: The cavity size was normal. Wall thickness was     increased in a pattern of mild LVH. Systolic function was normal.     The estimated ejection fraction was in the range of 60% to 65%.     Although no diagnostic regional wall motion abnormality was     identified, this possibility cannot be completely excluded on the     basis of this study. The apex was poorly visualized. Doppler     parameters are consistent with abnormal left ventricular     relaxation (grade 1 diastolic dysfunction).   - Aortic valve: There was no stenosis.   - Aorta: Aortic root borderline dilated. Aortic root dimension: 38mm     (ED).   - Mitral valve: Mildly calcified annulus. No significant     regurgitation.   - Left atrium: The atrium was mildly dilated.   - Right ventricle: The cavity size was normal. Systolic function was     normal.   - Pulmonary arteries: PA peak pressure: 30mm Hg (S).   - Inferior vena cava: The vessel was normal in size; the     respirophasic diameter changes were in the normal range (= 50%);     findings are consistent with normal central venous pressure.   Assessment and Plan

## 2010-12-15 ENCOUNTER — Encounter (HOSPITAL_COMMUNITY)
Admission: RE | Admit: 2010-12-15 | Discharge: 2010-12-15 | Disposition: A | Discharge status: Home / Self Care | Payer: 59 | Source: Ambulatory Visit | Attending: Cardiology | Admitting: Cardiology

## 2010-12-16 ENCOUNTER — Ambulatory Visit: Payer: 59 | Admitting: Cardiology

## 2010-12-17 ENCOUNTER — Encounter (HOSPITAL_COMMUNITY)
Admission: RE | Admit: 2010-12-17 | Discharge: 2010-12-17 | Disposition: A | Discharge status: Home / Self Care | Payer: 59 | Source: Ambulatory Visit | Attending: Cardiology | Admitting: Cardiology

## 2010-12-18 DIAGNOSIS — R002 Palpitations: Secondary | ICD-10-CM

## 2010-12-19 ENCOUNTER — Encounter (HOSPITAL_COMMUNITY)
Admission: RE | Admit: 2010-12-19 | Discharge: 2010-12-19 | Disposition: A | Discharge status: Home / Self Care | Payer: 59 | Source: Ambulatory Visit | Attending: Cardiology | Admitting: Cardiology

## 2010-12-22 ENCOUNTER — Encounter (HOSPITAL_COMMUNITY): Payer: 59

## 2010-12-24 ENCOUNTER — Encounter (HOSPITAL_COMMUNITY)
Admission: RE | Admit: 2010-12-24 | Discharge: 2010-12-24 | Disposition: A | Discharge status: Home / Self Care | Payer: 59 | Source: Ambulatory Visit | Attending: Cardiology | Admitting: Cardiology

## 2010-12-26 ENCOUNTER — Encounter (HOSPITAL_COMMUNITY)
Admission: RE | Admit: 2010-12-26 | Discharge: 2010-12-26 | Disposition: A | Discharge status: Home / Self Care | Payer: 59 | Source: Ambulatory Visit | Attending: Cardiology | Admitting: Cardiology

## 2010-12-29 ENCOUNTER — Encounter (HOSPITAL_COMMUNITY): Payer: 59

## 2010-12-31 ENCOUNTER — Encounter (HOSPITAL_COMMUNITY): Payer: 59

## 2011-01-02 ENCOUNTER — Encounter (HOSPITAL_COMMUNITY): Payer: 59

## 2011-01-05 ENCOUNTER — Encounter (HOSPITAL_COMMUNITY): Payer: 59

## 2011-01-07 ENCOUNTER — Encounter (HOSPITAL_COMMUNITY): Payer: 59

## 2011-01-09 ENCOUNTER — Encounter (HOSPITAL_COMMUNITY): Payer: 59

## 2011-01-12 ENCOUNTER — Encounter (HOSPITAL_COMMUNITY): Payer: 59

## 2011-01-12 ENCOUNTER — Telehealth: Payer: Self-pay | Admitting: Cardiology

## 2011-01-12 NOTE — Telephone Encounter (Signed)
Patient called and advised he is wearing a monitor since late September.  He has experienced some fluttering and would like to be seen.  Scheduled with Gladis Riffle, NP on 01-11-11 at 1:40pm.  Patient advised he has 5 more days to wear monitor.

## 2011-01-12 NOTE — Telephone Encounter (Signed)
Spoke with [name] [name] and made him aware that we will be able to pull up any needed monitor information and address any needs accordingly at office visit tomorrow.

## 2011-01-13 ENCOUNTER — Encounter: Payer: Self-pay | Admitting: Adult Health

## 2011-01-13 ENCOUNTER — Ambulatory Visit (INDEPENDENT_AMBULATORY_CARE_PROVIDER_SITE_OTHER): Payer: BC Managed Care – PPO | Admitting: Adult Health

## 2011-01-13 DIAGNOSIS — R002 Palpitations: Secondary | ICD-10-CM

## 2011-01-13 DIAGNOSIS — I10 Essential (primary) hypertension: Secondary | ICD-10-CM

## 2011-01-13 DIAGNOSIS — I2510 Atherosclerotic heart disease of native coronary artery without angina pectoris: Secondary | ICD-10-CM

## 2011-01-13 MED ORDER — MAGNESIUM OXIDE 400 MG PO TABS: 400.0000 mg | ORAL_TABLET | Freq: Every day | ORAL | Status: DC

## 2011-01-13 NOTE — Assessment & Plan Note (Signed)
He is asymptomatic at present for angina.  Continue current medications.

## 2011-01-13 NOTE — Assessment & Plan Note (Signed)
His BP is mildly elevated on this visit. I have checked it again with manual cuff and it correlates with initial check.  He admits to being slightly anxious about test results.  If he continues elevated consistently he may need to have coreg increased. Will not do so at this time.  He will follow-up in 3 months unless symptomatic.

## 2011-01-13 NOTE — Assessment & Plan Note (Addendum)
I have reviewed cardionet recordings, and although not officially read yet, it is noted that he has frequent PVC's and PAC's without couplets or salvos.  They appear to be intermittent and not for a long duration. Otherwise he is in NSR.  He is advised that these are benign at this time. He is also advised to avoid caffeine and stimulants.  He may need to speak to primary care physician to discuss reduction of stress or antianxiety medication at his discretion.  I have also suggested Magnesium 400 mg daily to help with the palpitations.

## 2011-01-13 NOTE — Patient Instructions (Addendum)
Your physician has recommended you make the following change in your medication: start taking over the counter Magnesium 400 mg daily  Your physician recommends that you schedule a follow-up appointment in: 3 months

## 2011-01-13 NOTE — Progress Notes (Signed)
Clinical Summary [name] is a 55 y.o.male presenting for followup.  He recently had an acute anterolateral myocardial infarction while working at [facility] [employer], fortunately was in house when he had a VF arrest with quick resuscitation and urgent coronary angiography demonstrating progressive CAD principally in the left anterior descending distribution. He underwent DES to the bifurcation of the LAD and diagonal, and was otherwise managed medically for significant diffuse distal disease in the circumflex an RCA. LVEF was preserved.    He was cardiac rehab this week and has been experiencing more palpitations.  Cardiac monitor during exercise demonstrated frequent PAC"s, occasional PVC's. He was asymptomatic with these but has become concerned about the frequency. He states he feels this at home as well at rest and with activity. He denies any chest pain or shortness of breath.   On last visit he was ordered a cardionet monitor for one week. He is here for discussion of results.  He continues to have the palpitations.  He admits stress and aggravation play a role in his symptoms.  No Known Allergies  Medication list reviewed.  Past Medical History  Diagnosis Date  . Coronary atherosclerosis of native coronary artery     DES distal circumflex 2005, DES LAD/diagonal bifurcation 7/12  . Type 2 diabetes mellitus   . Essential hypertension, benign   . Mixed hyperlipidemia   . Myocardial infarction     Anterolateral with VF arrest 7/12  . Diabetes mellitus     Past Surgical History  Procedure Date  . Inguinal hernia repair   . Cardiac catheterization   . Coronary angioplasty   . Carotid stent     Family History  Problem Relation Age of Onset  . Hypertension    . Hyperlipidemia Mother   . Hypertension Mother   . Hyperlipidemia Father   . Hypertension Father     Social History [name] reports that he has never smoked. He has never used smokeless tobacco. [name] reports that he  does not drink alcohol.  Review of Systems See history of present illness, Otherwise negative.  Physical Examination Filed Vitals:   01/13/11 1407  BP: 152/88  Pulse: 85  Resp: 16  Overweight male in no acute distress. HEENT: Conjunctiva and lids normal, affect with moist mucosa. Neck: Supple, no elevated JVP or carotid bruits. Lungs: Clear to auscultation, nonlabored. Cardiac: Regular rate and rhythm, no S3 gallop. Abdomen: Soft, nontender, bowel sounds present, no bruits. Skin: Warm and dry, somewhat pale. Extremities: No pitting edema, distal pulses 1-2+. Musculoskeletal: No kyphosis. Neuropsychiatric: Alert and oriented x3, affect appropriate.   ECG Sinus rhythm with sinus arrhythmia, anterolateral T wave inversions consistent with recent infarct.  Studies Cardiac catheterization 09/30/2010: 1. The right coronary artery is a small, nondominant vessel.  There is   an 80-90% mid stenosis.   2. Left main.  Left main is calcified with nonobstructive plaque.  There is no high grade stenosis present.  Left main divides into the LAD and left circumflex.   3. LAD.  The LAD is extensively calcified to the proximal and  midportion of the mid LAD has high grade 95% stenosis with filling defects suggestive of thrombus, that defect enters into the second diagonal branch which also has high grade ostial compromise of 90-       95%.  Further in the diagonal, there is a 90% stenosis.  The diagonal is a fairly small vessel estimated at 2 mm or less in  diameter.  The mid  and distal LAD have diffuse plaquing with about a 50% stenosis near the apical portion of the LAD.   4. Left circumflex.  The left circumflex is a large, dominant vessel,  it gives off small diffusely diseased first OM.  There is also a small diffusely diseased intermediate branch.  Both of those branches have 80-90% stenosis.  There is a large second obtuse marginal branch that has serial 50% stenosis.  The distal AV groove  circumflex has 80-90% stenosis leading into a diffusely diseased small PDA branch that also has tandem 80-90% stenosis.  Echocardiogram 10/02/2010: - Left ventricle: The cavity size was normal. Wall thickness was increased in a pattern of mild LVH. Systolic function was normal. The estimated ejection fraction was in the range of 60% to 65%.  Although no diagnostic regional wall motion abnormality was  identified, this possibility cannot be completely excluded on the basis of this study. The apex was poorly visualized. Doppler parameters are consistent with abnormal left ventricular relaxation (grade 1 diastolic dysfunction).   - Aortic valve: There was no stenosis.   - Aorta: Aortic root borderline dilated. Aortic root dimension: 38mm     (ED).   - Mitral valve: Mildly calcified annulus. No significant     regurgitation.   - Left atrium: The atrium was mildly dilated.   - Right ventricle: The cavity size was normal. Systolic function was     normal.   - Pulmonary arteries: PA peak pressure: 30mm Hg (S).   - Inferior vena cava: The vessel was normal in size; the     respirophasic diameter changes were in the normal range (= 50%);  findings are consistent with normal central venous pressure.   Assessment and Plan

## 2011-01-14 ENCOUNTER — Encounter (HOSPITAL_COMMUNITY): Payer: 59

## 2011-01-16 ENCOUNTER — Encounter (HOSPITAL_COMMUNITY): Payer: 59

## 2011-01-19 ENCOUNTER — Encounter (HOSPITAL_COMMUNITY): Payer: 59

## 2011-01-21 ENCOUNTER — Encounter (HOSPITAL_COMMUNITY): Payer: 59

## 2011-01-23 ENCOUNTER — Encounter (HOSPITAL_COMMUNITY): Payer: 59

## 2011-01-26 ENCOUNTER — Encounter (HOSPITAL_COMMUNITY): Payer: 59

## 2011-01-27 ENCOUNTER — Encounter: Payer: Self-pay | Admitting: Cardiology

## 2011-01-28 ENCOUNTER — Encounter (HOSPITAL_COMMUNITY): Payer: 59

## 2011-01-30 ENCOUNTER — Encounter (HOSPITAL_COMMUNITY): Payer: 59

## 2011-02-02 ENCOUNTER — Encounter (HOSPITAL_COMMUNITY): Payer: 59

## 2011-02-04 ENCOUNTER — Encounter (HOSPITAL_COMMUNITY): Payer: 59

## 2011-02-05 ENCOUNTER — Ambulatory Visit (HOSPITAL_COMMUNITY)
Admission: RE | Admit: 2011-02-05 | Discharge: 2011-02-05 | Disposition: A | Discharge status: Home / Self Care | Payer: BC Managed Care – PPO | Source: Ambulatory Visit | Attending: Pulmonary Disease | Admitting: Pulmonary Disease

## 2011-02-05 ENCOUNTER — Other Ambulatory Visit (HOSPITAL_COMMUNITY): Payer: Self-pay | Admitting: Pulmonary Disease

## 2011-02-05 DIAGNOSIS — R42 Dizziness and giddiness: Secondary | ICD-10-CM

## 2011-02-05 DIAGNOSIS — E119 Type 2 diabetes mellitus without complications: Secondary | ICD-10-CM | POA: Insufficient documentation

## 2011-02-05 DIAGNOSIS — I10 Essential (primary) hypertension: Secondary | ICD-10-CM | POA: Insufficient documentation

## 2011-02-05 DIAGNOSIS — R110 Nausea: Secondary | ICD-10-CM | POA: Insufficient documentation

## 2011-02-06 ENCOUNTER — Encounter (HOSPITAL_COMMUNITY): Payer: 59

## 2011-02-09 ENCOUNTER — Encounter (HOSPITAL_COMMUNITY): Payer: 59

## 2011-02-09 NOTE — Progress Notes (Signed)
Cardiac Rehab Progress Report  Orientation:  11/11/2010 Graduate Date:  tbd Discharge Date:  tbd  Cardiologist:  Excell Seltzer Family MD:  Benedetto Goad Time:  08:15  A.  Exercise Program:  Tolerates exercise @ 3.0 METS for 15 minutes  B.  Mental Health:  Good mental attitude  C.  Education/Instruction/Skills  Knows THR for exercise and Uses Perceived Exertion Scale and/or Dyspnea Scale  D.  Nutrition/Weight Control/Body Composition:  Adherence to prescribed nutrition program: good   E.  Blood Lipids    Lab Results  Component Value Date   CHOL 76 11/24/2010     Lab Results  Component Value Date   TRIG 103 11/24/2010     Lab Results  Component Value Date   HDL 19* 11/24/2010     Lab Results  Component Value Date   CHOLHDL 4.0 11/24/2010     No results found for this basename: LDLDIRECT      F.  Lifestyle Changes:  Making positive lifestyle changes  G.  Symptoms noted with exercise:  Asymptomatic  Report Completed By:  Angelica Pou  Comments:  This is patients 1st week report. He has done well. He achieved a peak met level of 3.0. His resting HR 86 and resting BP of 120/72. His peak HR is 99 and peak BP is 120/60. Will  send a [place] report on his progress.

## 2011-02-09 NOTE — Progress Notes (Signed)
Cardiac Rehab Progress Report  Orientation: 11/11/2010 Graduate Date:  12/26/2010 Discharge Date:  12/26/2010  Cardiologist:  Diona Browner Family MD:  Benedetto Goad Time:  08:15  A.  Exercise Program:  Tolerates exercise @ 2.8 METS for 15 minutes and Discharged  B.  Mental Health:  Good mental attitude  C.  Education/Instruction/Skills  Knows THR for exercise and Uses Perceived Exertion Scale and/or Dyspnea Scale  D.  Nutrition/Weight Control/Body Composition:  Adherence to prescribed nutrition program: good   E.  Blood Lipids    Lab Results  Component Value Date   CHOL 76 11/24/2010     Lab Results  Component Value Date   TRIG 103 11/24/2010     Lab Results  Component Value Date   HDL 19* 11/24/2010     Lab Results  Component Value Date   CHOLHDL 4.0 11/24/2010     No results found for this basename: LDLDIRECT      F.  Lifestyle Changes:  Making positive lifestyle changes  G.  Symptoms noted with exercise:  Asymptomatic  Report Completed By:  Angelica Pou   Comments:This is patients last report. He attended 16 sessions, due to a change in his Ins. He achieved a peak mets of 2.8. He is going to go to a local gym to continue to exercise.

## 2011-02-11 ENCOUNTER — Encounter (HOSPITAL_COMMUNITY): Payer: 59

## 2011-02-13 ENCOUNTER — Encounter (HOSPITAL_COMMUNITY): Payer: 59

## 2011-02-16 ENCOUNTER — Encounter (HOSPITAL_COMMUNITY): Payer: 59

## 2011-02-18 ENCOUNTER — Encounter (HOSPITAL_COMMUNITY): Payer: 59

## 2011-02-20 ENCOUNTER — Encounter (HOSPITAL_COMMUNITY): Payer: 59

## 2011-02-23 ENCOUNTER — Encounter (HOSPITAL_COMMUNITY): Payer: 59

## 2011-02-25 ENCOUNTER — Encounter (HOSPITAL_COMMUNITY): Payer: 59

## 2011-02-27 ENCOUNTER — Encounter (HOSPITAL_COMMUNITY): Payer: 59

## 2011-03-02 ENCOUNTER — Encounter (HOSPITAL_COMMUNITY): Payer: 59

## 2011-03-04 ENCOUNTER — Encounter (HOSPITAL_COMMUNITY): Payer: 59

## 2011-03-17 ENCOUNTER — Other Ambulatory Visit: Payer: Self-pay | Admitting: Physician Assistant

## 2011-04-08 ENCOUNTER — Ambulatory Visit (INDEPENDENT_AMBULATORY_CARE_PROVIDER_SITE_OTHER): Payer: BC Managed Care – PPO | Admitting: Cardiology

## 2011-04-08 ENCOUNTER — Encounter: Payer: Self-pay | Admitting: Cardiology

## 2011-04-08 VITALS — BP 161/95 | HR 86 | Resp 18 | Ht 69.0 in | Wt 215.0 lb

## 2011-04-08 DIAGNOSIS — I10 Essential (primary) hypertension: Secondary | ICD-10-CM

## 2011-04-08 DIAGNOSIS — I2510 Atherosclerotic heart disease of native coronary artery without angina pectoris: Secondary | ICD-10-CM

## 2011-04-08 DIAGNOSIS — R002 Palpitations: Secondary | ICD-10-CM

## 2011-04-08 DIAGNOSIS — E782 Mixed hyperlipidemia: Secondary | ICD-10-CM

## 2011-04-08 NOTE — Patient Instructions (Signed)
**Note De-Identified [name] [name] Obfuscation** Your physician recommends that you return for lab work in: THIS WEEK  Your physician recommends that you schedule a follow-up appointment in: 6 MONTHS

## 2011-04-08 NOTE — Assessment & Plan Note (Signed)
Due for followup FLP and LFT. 

## 2011-04-08 NOTE — Assessment & Plan Note (Signed)
Clinically stable, followup ECG is essentially normal. No changes made to present medical regimen. I reinforced diet and exercise regimen. Routine followup arranged.

## 2011-04-08 NOTE — Assessment & Plan Note (Signed)
Blood pressure elevated again today. We discussed this. I asked him to keep a close eye on blood pressure, watch salt in the diet. Consider increasing Coreg to 25 mg twice daily as a next step, otherwise he may need an additional medication.

## 2011-04-08 NOTE — Progress Notes (Signed)
   Clinical Summary [name] is a 56 y.o.male presenting for followup. He was seen in October 2012. Since that time he reports no progressive angina or shortness of breath. Previously noted palpitations have resolved. He has had no syncope.  He reports compliance with his medications. States his blood pressure is usually better controlled, and the "120s" when he saw Dr. Juanetta Gosling last. I did discuss diet and sodium restriction, possibility that when we may need to further titrate his medications.  He continues to exercise by walking on the treadmill and using recumbent bike. No nitroglycerin use. He continues on DAPT.   No Known Allergies  Current Outpatient Prescriptions  Medication Sig Dispense Refill  . aspirin 81 MG tablet Take 81 mg by mouth daily.        Marland Kitchen atorvastatin (LIPITOR) 80 MG tablet Take 0.5 tablets (40 mg total) by mouth daily.      . carvedilol (COREG) 12.5 MG tablet Take 12.5 mg by mouth 2 (two) times daily with a meal.        . insulin lispro (HUMALOG) 100 UNIT/ML injection Inject into the skin as needed.      . Liraglutide (VICTOZA) 18 MG/3ML SOLN Inject 1.8 mg into the skin daily.        Marland Kitchen lisinopril (PRINIVIL,ZESTRIL) 20 MG tablet TAKE 1 TABLET TWICE DAILY.  60 tablet  1  . metFORMIN (GLUCOPHAGE) 500 MG tablet Take 1,000 mg by mouth 2 (two) times daily with a meal.       . prasugrel (EFFIENT) 10 MG TABS Take 10 mg by mouth daily.        Marland Kitchen zolpidem (AMBIEN) 10 MG tablet Take 10 mg by mouth at bedtime as needed.        Marland Kitchen DISCONTD: lisinopril (PRINIVIL,ZESTRIL) 10 MG tablet Take 20 mg by mouth 2 (two) times daily.         Past Medical History  Diagnosis Date  . Coronary atherosclerosis of native coronary artery     DES distal circumflex 2005, DES LAD/diagonal bifurcation 7/12  . Type 2 diabetes mellitus   . Essential hypertension, benign   . Mixed hyperlipidemia   . Myocardial infarction     Anterolateral with VF arrest 7/12  . Diabetes mellitus     Social  History [name] reports that he has never smoked. He has never used smokeless tobacco. [name] reports that he does not drink alcohol.  Review of Systems No reported bleeding problems. Stable appetite. Recent sinus congestion. No fevers or chills. Otherwise negative.  Physical Examination Filed Vitals:   04/08/11 1431  BP: 161/95  Pulse: 86  Resp: 18   Overweight male in no acute distress.  HEENT: Conjunctiva and lids normal, affect with moist mucosa.  Neck: Supple, no elevated JVP or carotid bruits.  Lungs: Clear to auscultation, nonlabored.  Cardiac: Regular rate and rhythm, no S3 gallop.  Abdomen: Soft, nontender, bowel sounds present, no bruits.  Skin: Warm and dry, somewhat pale.  Extremities: No pitting edema, distal pulses 1-2+.  Musculoskeletal: No kyphosis.  Neuropsychiatric: Alert and oriented x3, affect appropriate.   ECG Sinus arrhythmia, heart rate 80.  Problem List and Plan

## 2011-04-08 NOTE — Assessment & Plan Note (Signed)
These have resolved completely. Continued observation.

## 2011-04-13 LAB — HEPATIC FUNCTION PANEL
AST: 16 U/L (ref 0–37)
Albumin: 4.2 g/dL (ref 3.5–5.2)
Alkaline Phosphatase: 51 U/L (ref 39–117)
Bilirubin, Direct: 0.2 mg/dL (ref 0.0–0.3)
Total Bilirubin: 0.6 mg/dL (ref 0.3–1.2)

## 2011-04-13 LAB — LIPID PANEL: HDL: 25 mg/dL — ABNORMAL LOW (ref >39)

## 2011-05-19 ENCOUNTER — Other Ambulatory Visit: Payer: Self-pay | Admitting: Cardiology

## 2011-06-01 ENCOUNTER — Other Ambulatory Visit: Payer: Self-pay | Admitting: Adult Health

## 2011-06-03 ENCOUNTER — Other Ambulatory Visit: Payer: Self-pay | Admitting: *Deleted

## 2011-06-03 MED ORDER — LISINOPRIL 20 MG PO TABS: 20.0000 mg | ORAL_TABLET | Freq: Two times a day (BID) | ORAL | Status: DC

## 2011-09-30 ENCOUNTER — Ambulatory Visit: Payer: BC Managed Care – PPO | Admitting: Cardiology

## 2011-10-05 ENCOUNTER — Other Ambulatory Visit: Payer: Self-pay

## 2011-10-05 MED ORDER — PRASUGREL HCL 10 MG PO TABS: 10.0000 mg | ORAL_TABLET | Freq: Every day | ORAL | Status: DC

## 2011-10-13 ENCOUNTER — Emergency Department (HOSPITAL_COMMUNITY)
Admission: EM | Admit: 2011-10-13 | Discharge: 2011-10-14 | Disposition: A | Discharge status: Home / Self Care | Payer: BC Managed Care – PPO | Attending: Emergency Medicine | Admitting: Emergency Medicine

## 2011-10-13 ENCOUNTER — Encounter (HOSPITAL_COMMUNITY): Payer: Self-pay | Admitting: *Deleted

## 2011-10-13 ENCOUNTER — Other Ambulatory Visit: Payer: Self-pay

## 2011-10-13 DIAGNOSIS — E785 Hyperlipidemia, unspecified: Secondary | ICD-10-CM | POA: Insufficient documentation

## 2011-10-13 DIAGNOSIS — I2510 Atherosclerotic heart disease of native coronary artery without angina pectoris: Secondary | ICD-10-CM | POA: Insufficient documentation

## 2011-10-13 DIAGNOSIS — Z9861 Coronary angioplasty status: Secondary | ICD-10-CM | POA: Insufficient documentation

## 2011-10-13 DIAGNOSIS — I252 Old myocardial infarction: Secondary | ICD-10-CM | POA: Insufficient documentation

## 2011-10-13 DIAGNOSIS — I10 Essential (primary) hypertension: Secondary | ICD-10-CM | POA: Insufficient documentation

## 2011-10-13 DIAGNOSIS — Z794 Long term (current) use of insulin: Secondary | ICD-10-CM | POA: Insufficient documentation

## 2011-10-13 DIAGNOSIS — E119 Type 2 diabetes mellitus without complications: Secondary | ICD-10-CM | POA: Insufficient documentation

## 2011-10-13 DIAGNOSIS — R42 Dizziness and giddiness: Secondary | ICD-10-CM | POA: Insufficient documentation

## 2011-10-13 DIAGNOSIS — Z79899 Other long term (current) drug therapy: Secondary | ICD-10-CM | POA: Insufficient documentation

## 2011-10-13 DIAGNOSIS — T887XXA Unspecified adverse effect of drug or medicament, initial encounter: Secondary | ICD-10-CM

## 2011-10-13 NOTE — ED Notes (Addendum)
Pt reports being dizzy and slightly disoriented, some double vision reported.  Reporting symptoms began 30 or 40 min ago.  No facial droop noted.  Tongue midline.  No slurred speech noted.  No pronator drift, strength equal bilaterally. Reports taking an [place] about an hour ago, but states that he usually takes one and isn't affected this way.  Reports intermittent headache today.

## 2011-10-13 NOTE — ED Provider Notes (Signed)
History     CSN: [id number]  Arrival date & time 10/13/11  2311   First MD Initiated Contact with Patient 10/13/11 2347      No chief complaint on file.   (Consider location/radiation/quality/duration/timing/severity/associated sxs/prior treatment) HPI  [name] [name] is a 56 y.o. male who presents to the Emergency Department complaining of dizziness,disorientation after taking ambien for sleep then proceeding to make a bed and sit to watch television. He began to [facility] told his wife he was seeing double, yet does not remember telling her. He denies fever, chills, nausea, vomiting, weakness. He did have trouble with balance when he got up to go to bed. His wife had a TIA last year and was concerned he was having the same. Currently he is not having vision changes, balance problems or speech problems.   PCP Dr. Juanetta Gosling  Past Medical History  Diagnosis Date  . Coronary atherosclerosis of native coronary artery     DES distal circumflex 2005, DES LAD/diagonal bifurcation 7/12  . Type 2 diabetes mellitus   . Essential hypertension, benign   . Mixed hyperlipidemia   . Myocardial infarction     Anterolateral with VF arrest 7/12  . Diabetes mellitus     Past Surgical History  Procedure Date  . Inguinal hernia repair   . Cardiac catheterization   . Coronary angioplasty   . Carotid stent     Family History  Problem Relation Age of Onset  . Hypertension    . Hyperlipidemia Mother   . Hypertension Mother   . Hyperlipidemia Father   . Hypertension Father     History  Substance Use Topics  . Smoking status: Never Smoker   . Smokeless tobacco: Never Used  . Alcohol Use: No      Review of Systems  Constitutional: Negative for fever.       10 Systems reviewed and are negative for acute change except as noted in the HPI.  HENT: Negative for congestion and trouble swallowing.   Eyes: Positive for visual disturbance. Negative for discharge and redness.  Respiratory:  Negative for cough and shortness of breath.   Cardiovascular: Negative for chest pain.  Gastrointestinal: Negative for vomiting and abdominal pain.  Musculoskeletal: Negative for back pain.  Skin: Negative for rash.  Neurological: Positive for dizziness. Negative for syncope, numbness and headaches.       Balance problems.  Psychiatric/Behavioral:       No behavior change.    Allergies  Review of patient's allergies indicates no known allergies.  Home Medications   Current Outpatient Rx  Name Route Sig Dispense Refill  . ASPIRIN 81 MG PO TABS Oral Take 81 mg by mouth daily.      . ATORVASTATIN CALCIUM 80 MG PO TABS Oral Take 0.5 tablets (40 mg total) by mouth daily.    Marland Kitchen COREG 12.5 MG PO TABS  TAKE (1) TABLET TWICE DAILY. 60 each 6  . INSULIN LISPRO (HUMAN) 100 UNIT/ML [place] SOLN Subcutaneous Inject into the skin as needed.    Marland Kitchen LIRAGLUTIDE 18 MG/3ML [place] SOLN Subcutaneous Inject 1.8 mg into the skin daily.      Marland Kitchen LISINOPRIL 20 MG PO TABS Oral Take 1 tablet (20 mg total) by mouth 2 (two) times daily. 60 tablet 6  . METFORMIN HCL 500 MG PO TABS Oral Take 1,000 mg by mouth 2 (two) times daily with a meal.     . PRASUGREL HCL 10 MG PO TABS Oral Take 1 tablet (10 mg  total) by mouth daily. 30 tablet 6  . ZOLPIDEM TARTRATE 10 MG PO TABS Oral Take 10 mg by mouth at bedtime as needed.        BP 168/92  Pulse 81  Temp 97.8 F (36.6 C)  Resp 18  Ht 5\' 9"  (1.753 m)  Wt 195 lb (88.451 kg)  BMI 28.80 kg/m2  SpO2 96%  Physical Exam  Nursing note and vitals reviewed. Constitutional: He is oriented to person, place, and time. He appears well-developed and well-nourished. No distress.       Awake, alert, nontoxic appearance.  HENT:  Head: Normocephalic and atraumatic.  Right Ear: External ear normal.  Left Ear: External ear normal.  Mouth/Throat: Oropharynx is clear and moist.  Eyes: Conjunctivae and EOM are normal. Pupils are equal, round, and reactive to light. Right eye exhibits no  discharge. Left eye exhibits no discharge.  Neck: Normal range of motion. Neck supple.  Pulmonary/Chest: Effort normal and breath sounds normal. He exhibits no tenderness.  Abdominal: Soft. Bowel sounds are normal. There is no tenderness. There is no rebound.  Musculoskeletal: Normal range of motion. He exhibits no tenderness.       Baseline ROM, no obvious new focal weakness.  Neurological: He is alert and oriented to person, place, and time. He has normal reflexes. No cranial nerve deficit.       Mental status and motor strength appears baseline for patient and situation.Left hand dominant.  Skin: Skin is warm and dry. No rash noted.  Psychiatric: He has a normal mood and affect.    ED Course  Procedures (including critical care time)   Date: 10/13/2011  2345  Rate: 84  Rhythm: normal sinus rhythm  QRS Axis: normal  Intervals: normal  ST/T Wave abnormalities: normal  Conduction Disutrbances: none  Narrative Interpretation: unremarkable     MDM  Patient took an [place] and stayed up doing activities resulting in a medication affect including disorientation, speech slurring, vision and balance problems.They have resolved since he has been here. He ambulated to the bathroom without difficulty. Blood pressure improved without intervention. Repeat was 168/82. Pt stable in ED with no significant deterioration in condition.The patient appears reasonably screened and/or stabilized for discharge and I doubt any other medical condition or other [facility] requiring further screening, evaluation, or treatment in the ED at this time prior to discharge.  MDM Reviewed: nursing note and vitals Interpretation: ECG           Nicoletta Dress. Colon Branch, MD 10/14/11 [phone]

## 2011-10-14 NOTE — ED Notes (Signed)
Patient reports feeling much better now than when he first arrived.  Skin warm and dry  Color good.  Alert and responsive, speech clear.  States he took a whole [employer] and did not go to bed right away - usually takes a half and goes to bed immediately.

## 2011-10-15 ENCOUNTER — Ambulatory Visit (INDEPENDENT_AMBULATORY_CARE_PROVIDER_SITE_OTHER): Payer: BC Managed Care – PPO | Admitting: Cardiology

## 2011-10-15 ENCOUNTER — Encounter: Payer: Self-pay | Admitting: Cardiology

## 2011-10-15 VITALS — BP 156/82 | HR 93 | Ht 69.0 in | Wt 201.0 lb

## 2011-10-15 DIAGNOSIS — E785 Hyperlipidemia, unspecified: Secondary | ICD-10-CM

## 2011-10-15 DIAGNOSIS — I2510 Atherosclerotic heart disease of native coronary artery without angina pectoris: Secondary | ICD-10-CM

## 2011-10-15 DIAGNOSIS — E782 Mixed hyperlipidemia: Secondary | ICD-10-CM

## 2011-10-15 NOTE — Progress Notes (Signed)
   Clinical Summary [name] [name] is a 56 y.o.male presenting for followup. He was seen in January. Lipids at that time were aggressively controlled with cholesterol 94, triglyceride 82, LDL 53, low HDL of 25. He has maintained Lipitor at 40 mg daily.  He reports no angina, no progressive shortness of breath. Working full time now. Has not had as much time for exercise.  He was seen in the emergency part recently after taking Ambien with some apparent side effects which resolved. ECG done 2 days ago was normal.  He had blood pressure check earlier today at 116/82 and reports compliance with his medications.   No Known Allergies  Current Outpatient Prescriptions  Medication Sig Dispense Refill  . aspirin 81 MG tablet Take 81 mg by mouth daily.        Marland Kitchen atorvastatin (LIPITOR) 80 MG tablet Take 0.5 tablets (40 mg total) by mouth daily.      Marland Kitchen COREG 12.5 MG tablet TAKE (1) TABLET TWICE DAILY.  60 each  6  . insulin lispro (HUMALOG) 100 UNIT/ML injection Inject into the skin as needed.      . Liraglutide (VICTOZA) 18 MG/3ML SOLN Inject 1.8 mg into the skin daily.        Marland Kitchen lisinopril (PRINIVIL,ZESTRIL) 20 MG tablet Take 1 tablet (20 mg total) by mouth 2 (two) times daily.  60 tablet  6  . metFORMIN (GLUCOPHAGE) 500 MG tablet Take 1,000 mg by mouth 2 (two) times daily with a meal.       . NITROSTAT 0.4 MG SL tablet Place 0.4 mg under the tongue every 5 (five) minutes as needed.       . prasugrel (EFFIENT) 10 MG TABS Take 1 tablet (10 mg total) by mouth daily.  30 tablet  6  . zolpidem (AMBIEN) 10 MG tablet Take 10 mg by mouth at bedtime as needed.          Past Medical History  Diagnosis Date  . Coronary atherosclerosis of native coronary artery     DES distal circumflex 2005, DES LAD/diagonal bifurcation 7/12  . Type 2 diabetes mellitus   . Essential hypertension, benign   . Mixed hyperlipidemia   . Myocardial infarction     Anterolateral with VF arrest 7/12  . Diabetes mellitus      Social History [name] [name] reports that he has never smoked. He has never used smokeless tobacco. [name] [name] reports that he does not drink alcohol.  Review of Systems No further palpitations. No syncope. No reported bleeding problems. Otherwise negative.  Physical Examination Filed Vitals:   10/15/11 1520  BP: 156/82  Pulse: 93    Overweight male in no acute distress.  HEENT: Conjunctiva and lids normal, affect with moist mucosa.  Neck: Supple, no elevated JVP or carotid bruits.  Lungs: Clear to auscultation, nonlabored.  Cardiac: Regular rate and rhythm, no S3 gallop.  Abdomen: Soft, nontender, bowel sounds present, no bruits.  Skin: Warm and dry. Extremities: No pitting edema, distal pulses 1-2+.    Problem List and Plan   Coronary atherosclerosis of native coronary artery Clinically stable on medical therapy. Recent ECG normal. Continue observation. We did discuss trying to be more regular with exercise.  Essential hypertension, benign Continue current regimen.  Mixed hyperlipidemia Followup FLP and LFT.    Jonelle Sidle, M.D., F.A.C.C.

## 2011-10-15 NOTE — Assessment & Plan Note (Signed)
Followup FLP and LFT. 

## 2011-10-15 NOTE — Patient Instructions (Addendum)
Your physician recommends that you return for lab work in: FASTING LIPID AND LIVER PANEL 10/23/11; NOTHING TO EAT OR DRINK AFTER MIDNIGHT THE NIGHT BEFORE LAB WORK; THOUGH THE MORNING OF LAB YOU MAY TAKE YOUR MORNING MEDS WITH SOME WATER IS FINE.   Your physician recommends that you schedule a follow-up appointment in: 6 MONTHS WITH DR. MCDOWELL  NO CHANGES WERE MADE TODAY

## 2011-10-15 NOTE — Assessment & Plan Note (Signed)
Clinically stable on medical therapy. Recent ECG normal. Continue observation. We did discuss trying to be more regular with exercise.

## 2011-10-15 NOTE — Assessment & Plan Note (Signed)
Continue current regimen

## 2011-10-19 ENCOUNTER — Other Ambulatory Visit: Payer: Self-pay | Admitting: *Deleted

## 2011-10-19 DIAGNOSIS — E785 Hyperlipidemia, unspecified: Secondary | ICD-10-CM

## 2011-10-23 LAB — HEPATIC FUNCTION PANEL
Albumin: 4.2 g/dL (ref 3.5–5.2)
Alkaline Phosphatase: 56 U/L (ref 39–117)
Indirect Bilirubin: 0.4 mg/dL (ref 0.0–0.9)
Total Bilirubin: 0.6 mg/dL (ref 0.3–1.2)

## 2011-10-23 LAB — LIPID PANEL: LDL Cholesterol: 58 mg/dL (ref 0–99)

## 2011-10-26 ENCOUNTER — Encounter: Payer: Self-pay | Admitting: *Deleted

## 2011-12-16 ENCOUNTER — Other Ambulatory Visit: Payer: Self-pay | Admitting: Cardiology

## 2012-01-12 ENCOUNTER — Other Ambulatory Visit (HOSPITAL_COMMUNITY): Payer: Self-pay | Admitting: Pulmonary Disease

## 2012-01-12 ENCOUNTER — Ambulatory Visit (HOSPITAL_COMMUNITY)
Admission: RE | Admit: 2012-01-12 | Discharge: 2012-01-12 | Disposition: A | Discharge status: Home / Self Care | Payer: BC Managed Care – PPO | Source: Ambulatory Visit | Attending: Pulmonary Disease | Admitting: Pulmonary Disease

## 2012-01-12 DIAGNOSIS — M7989 Other specified soft tissue disorders: Secondary | ICD-10-CM

## 2012-04-12 ENCOUNTER — Ambulatory Visit (INDEPENDENT_AMBULATORY_CARE_PROVIDER_SITE_OTHER): Payer: BC Managed Care – PPO | Admitting: Cardiology

## 2012-04-12 ENCOUNTER — Encounter: Payer: Self-pay | Admitting: Cardiology

## 2012-04-12 VITALS — BP 141/86 | HR 98 | Ht 69.0 in | Wt 216.1 lb

## 2012-04-12 DIAGNOSIS — I2510 Atherosclerotic heart disease of native coronary artery without angina pectoris: Secondary | ICD-10-CM

## 2012-04-12 DIAGNOSIS — I10 Essential (primary) hypertension: Secondary | ICD-10-CM

## 2012-04-12 DIAGNOSIS — E782 Mixed hyperlipidemia: Secondary | ICD-10-CM

## 2012-04-12 NOTE — Patient Instructions (Addendum)
Your physician recommends that you schedule a follow-up appointment in: 6 MONTHS  Your physician recommends that you continue on your current medications as directed. Please refer to the Current Medication list given to you today.  Your physician recommends that you return for lab work in: 6 MONTHS PRIOR TO YOUR OFFICE VISIT, WE WILL SEND YOU A REMINDER LETTER TO ADVISE

## 2012-04-12 NOTE — Assessment & Plan Note (Signed)
Symptomatically stable on medical therapy. Continue current regimen. Recommended regular exercise regimen. Followup arranged.

## 2012-04-12 NOTE — Progress Notes (Signed)
   Clinical Summary [name] [name] is a 57 y.o.male presenting for followup. He was seen in July 2013. Reports no angina symptoms or progressive shortness of breath. 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 States he is under a lot of stress with family problems, specifically his son. He is not exercising regularly and we discussed this today.  Labwork in July 2013 showed cholesterol 94, triglycerides 75, HDL 21, and LDL 58. He reports compliance with his medications, no issues with Lipitor. No significant bleeding problems on DAPT.   No Known Allergies  Current Outpatient Prescriptions  Medication Sig Dispense Refill  . aspirin 81 MG tablet Take 81 mg by mouth daily.        Marland Kitchen atorvastatin (LIPITOR) 80 MG tablet Take 0.5 tablets (40 mg total) by mouth daily.      Marland Kitchen COREG 12.5 MG tablet TAKE (1) TABLET TWICE DAILY.  60 tablet  6  . insulin glargine (LANTUS) 100 UNIT/ML injection Inject 15 Units into the skin at bedtime.      . Liraglutide (VICTOZA) 18 MG/3ML SOLN Inject 1.8 mg into the skin daily.        Marland Kitchen lisinopril (PRINIVIL,ZESTRIL) 20 MG tablet Take 1 tablet (20 mg total) by mouth 2 (two) times daily.  60 tablet  6  . metFORMIN (GLUCOPHAGE) 500 MG tablet Take 1,000 mg by mouth 2 (two) times daily with a meal.       . NITROSTAT 0.4 MG SL tablet Place 0.4 mg under the tongue every 5 (five) minutes as needed.       . prasugrel (EFFIENT) 10 MG TABS Take 1 tablet (10 mg total) by mouth daily.  30 tablet  6  . zolpidem (AMBIEN) 10 MG tablet Take 10 mg by mouth at bedtime as needed.          Past Medical History  Diagnosis Date  . Coronary atherosclerosis of native coronary artery     DES distal circumflex 2005, DES LAD/diagonal bifurcation 7/12  . Type 2 diabetes mellitus   . Essential hypertension, benign   . Mixed hyperlipidemia   . Myocardial infarction     Anterolateral with VF arrest 7/12  . Diabetes mellitus     Social History [name] [name] reports  that he has never smoked. He has never used smokeless tobacco. [name] [name] reports that he does not drink alcohol.  Review of Systems No palpitations or syncope. No claudication. No cardiac hospitalizations in the interim. Otherwise negative.  Physical Examination Filed Vitals:   04/12/12 1514  BP: 141/86  Pulse: 98   Filed Weights   04/12/12 1514  Weight: 216 lb 1.9 oz (98.031 kg)    Overweight male in no acute distress.  HEENT: Conjunctiva and lids normal, affect with moist mucosa.  Neck: Supple, no elevated JVP or carotid bruits.  Lungs: Clear to auscultation, nonlabored.  Cardiac: Regular rate and rhythm, no S3 gallop.  Abdomen: Soft, nontender, bowel sounds present, no bruits.  Skin: Warm and dry.  Extremities: No pitting edema, distal pulses 1-2+.    Problem List and Plan   Coronary atherosclerosis of native coronary artery Symptomatically stable on medical therapy. Continue current regimen. Recommended regular exercise regimen. Followup arranged.  Essential hypertension, benign Sodium restriction, diet, exercise discussed.  Mixed hyperlipidemia CAD continue Lipitor, followup FLP and LFT for next visit. Increasing his exercise would likely help HDL.    Jonelle Sidle, M.D., F.A.C.C.

## 2012-04-12 NOTE — Assessment & Plan Note (Signed)
CAD continue Lipitor, followup FLP and LFT for next visit. Increasing his exercise would likely help HDL.

## 2012-04-12 NOTE — Assessment & Plan Note (Signed)
Sodium restriction, diet, exercise discussed.

## 2012-05-26 ENCOUNTER — Other Ambulatory Visit: Payer: Self-pay | Admitting: Cardiology

## 2012-05-26 MED ORDER — PRASUGREL HCL 10 MG PO TABS: 10.0000 mg | ORAL_TABLET | Freq: Every day | ORAL | Status: DC

## 2012-06-28 ENCOUNTER — Other Ambulatory Visit: Payer: Self-pay | Admitting: Adult Health

## 2012-07-18 ENCOUNTER — Other Ambulatory Visit: Payer: Self-pay | Admitting: Cardiology

## 2012-07-19 ENCOUNTER — Other Ambulatory Visit: Payer: Self-pay | Admitting: Cardiology

## 2012-10-19 ENCOUNTER — Encounter: Payer: Self-pay | Admitting: *Deleted

## 2012-10-19 ENCOUNTER — Other Ambulatory Visit: Payer: Self-pay | Admitting: *Deleted

## 2012-10-19 DIAGNOSIS — E782 Mixed hyperlipidemia: Secondary | ICD-10-CM

## 2012-10-24 ENCOUNTER — Other Ambulatory Visit: Payer: Self-pay | Admitting: *Deleted

## 2012-10-24 MED ORDER — LISINOPRIL 20 MG PO TABS: 20.0000 mg | ORAL_TABLET | Freq: Two times a day (BID) | ORAL | Status: DC

## 2012-12-14 ENCOUNTER — Other Ambulatory Visit: Payer: Self-pay | Admitting: Cardiology

## 2012-12-26 ENCOUNTER — Other Ambulatory Visit: Payer: Self-pay | Admitting: Cardiology

## 2013-01-23 ENCOUNTER — Other Ambulatory Visit: Payer: Self-pay | Admitting: Cardiology

## 2013-01-24 ENCOUNTER — Encounter: Payer: Self-pay | Admitting: *Deleted

## 2013-02-21 ENCOUNTER — Other Ambulatory Visit (HOSPITAL_COMMUNITY): Payer: Self-pay | Admitting: Pulmonary Disease

## 2013-02-21 ENCOUNTER — Ambulatory Visit (HOSPITAL_COMMUNITY)
Admission: RE | Admit: 2013-02-21 | Discharge: 2013-02-21 | Disposition: A | Discharge status: Home / Self Care | Payer: BC Managed Care – PPO | Source: Ambulatory Visit | Attending: Pulmonary Disease | Admitting: Pulmonary Disease

## 2013-02-21 DIAGNOSIS — M79672 Pain in left foot: Secondary | ICD-10-CM

## 2013-02-21 DIAGNOSIS — M79609 Pain in unspecified limb: Secondary | ICD-10-CM | POA: Insufficient documentation

## 2013-03-15 ENCOUNTER — Other Ambulatory Visit: Payer: Self-pay | Admitting: Cardiology

## 2013-03-27 ENCOUNTER — Other Ambulatory Visit: Payer: Self-pay | Admitting: Adult Health

## 2013-03-28 ENCOUNTER — Encounter: Payer: Self-pay | Admitting: Cardiology

## 2013-03-28 ENCOUNTER — Ambulatory Visit (INDEPENDENT_AMBULATORY_CARE_PROVIDER_SITE_OTHER): Payer: BC Managed Care – PPO | Admitting: Cardiology

## 2013-03-28 VITALS — BP 154/76 | HR 87 | Ht 69.0 in | Wt 221.0 lb

## 2013-03-28 DIAGNOSIS — E782 Mixed hyperlipidemia: Secondary | ICD-10-CM

## 2013-03-28 DIAGNOSIS — I2510 Atherosclerotic heart disease of native coronary artery without angina pectoris: Secondary | ICD-10-CM

## 2013-03-28 DIAGNOSIS — I10 Essential (primary) hypertension: Secondary | ICD-10-CM

## 2013-03-28 NOTE — Assessment & Plan Note (Signed)
Symptomatically stable on medical therapy. ECG reviewed. Continue observation. Did recommend diet and exercise regimen. 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 Followup arranged.

## 2013-03-28 NOTE — Progress Notes (Signed)
Clinical Summary [name] [name] is a 57 y.o.male last seen in January of this year. 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 He reports no angina symptoms. Occasionally feels a sense of palpitations when he is seated and crossing his legs, otherwise not with exertion, no associated dizziness or syncope. He reports compliance with his medications including Lipitor.  No recent lipid panel or LFTs. ECG shows sinus rhythm with decreased R-wave progression. We reviewed his medications. He remains on DAPT with bifurcational DES to the LAD/diagonal in the setting of ACS and diabetes.  He is not exercising, we did discuss this today. States his blood sugars have been reasonably well controlled.  No Known Allergies  Current Outpatient Prescriptions  Medication Sig Dispense Refill  . aspirin 81 MG tablet Take 81 mg by mouth daily.        Marland Kitchen atorvastatin (LIPITOR) 80 MG tablet Take 0.5 tablets (40 mg total) by mouth daily.      . carvedilol (COREG) 12.5 MG tablet TAKE (1) TABLET TWICE DAILY.  60 tablet  3  . EFFIENT 10 MG TABS tablet TAKE 1 TABLET BY MOUTH ONCE DAILY. PATIENT NEEDS TO BE SEEN.  30 tablet  0  . FORA LANCETS MISC       . FORA V30A BLOOD GLUCOSE TEST test strip       . insulin glargine (LANTUS) 100 UNIT/ML injection Inject 15 Units into the skin at bedtime.      . Liraglutide (VICTOZA) 18 MG/3ML SOLN Inject 1.8 mg into the skin daily.        Marland Kitchen lisinopril (PRINIVIL,ZESTRIL) 20 MG tablet TAKE (1) TABLET TWICE DAILY.  60 tablet  1  . metFORMIN (GLUCOPHAGE) 500 MG tablet Take 1,000 mg by mouth 2 (two) times daily with a meal.       . NITROSTAT 0.4 MG SL tablet Place 0.4 mg under the tongue every 5 (five) minutes as needed.       . QC PEN NEEDLES 31G X 8 MM MISC       . zolpidem (AMBIEN) 10 MG tablet Take 10 mg by mouth at bedtime as needed.         No current facility-administered medications for this visit.    Past Medical History  Diagnosis  Date  . Coronary atherosclerosis of native coronary artery     DES distal circumflex 2005, DES LAD/diagonal bifurcation 7/12  . Type 2 diabetes mellitus   . Essential hypertension, benign   . Mixed hyperlipidemia   . Myocardial infarction     Anterolateral with VF arrest 7/12  . Diabetes mellitus     Social History Mr. Arocho reports that he has never smoked. He has never used smokeless tobacco. Mr. Depree reports that he does not drink alcohol.  Review of Systems Negative except as outlined above.  Physical Examination Filed Vitals:   03/28/13 1350  BP: 154/76  Pulse: 87   Filed Weights   03/28/13 1350  Weight: 221 lb (100.245 kg)    Overweight male in no acute distress.  HEENT: Conjunctiva and lids normal, oropharynx with moist mucosa.  Neck: Supple, no elevated JVP or carotid bruits.  Lungs: Clear to auscultation, nonlabored.  Cardiac: Regular rate and rhythm, no S3 gallop.  Abdomen: Soft, nontender, bowel sounds present, no bruits.  Skin: Warm and dry.  Extremities: No pitting edema, distal pulses 1-2+.    Problem List and Plan   Coronary atherosclerosis of native  coronary artery Symptomatically stable on medical therapy. ECG reviewed. Continue observation. Did recommend diet and exercise regimen. 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 Followup arranged.  Mixed hyperlipidemia Reports compliance with Lipitor. Followup FLP and LFT to be arranged.  Essential hypertension, benign Blood pressure elevated today. Reinforced diet and exercise. He has been compliant with medications.    Jonelle Sidle, M.D., F.A.C.C.

## 2013-03-28 NOTE — Patient Instructions (Addendum)
Your physician wants you to follow-up in: one year You will receive a reminder letter in the mail two months in advance. If you don't receive a letter, please call our office to schedule the follow-up appointment    Your physician recommends that you continue on your current medications as directed. Please refer to the Current Medication list given to you today.   Your physician recommends that you return for lab work in: Now

## 2013-03-28 NOTE — Assessment & Plan Note (Signed)
Reports compliance with Lipitor. Followup FLP and LFT to be arranged.

## 2013-03-28 NOTE — Assessment & Plan Note (Signed)
Blood pressure elevated today. Reinforced diet and exercise. He has been compliant with medications.

## 2013-03-29 LAB — LIPID PANEL
Cholesterol: 89 mg/dL (ref 0–200)
HDL: 24 mg/dL — ABNORMAL LOW (ref >39)
LDL Cholesterol: 42 mg/dL (ref 0–99)
Triglycerides: 115 mg/dL (ref <150)
VLDL: 23 mg/dL (ref 0–40)

## 2013-03-29 LAB — HEPATIC FUNCTION PANEL
ALT: 27 U/L (ref 0–53)
Alkaline Phosphatase: 52 U/L (ref 39–117)
Bilirubin, Direct: 0.1 mg/dL (ref 0.0–0.3)
Indirect Bilirubin: 0.5 mg/dL (ref 0.0–0.9)
Total Protein: 6.7 g/dL (ref 6.0–8.3)

## 2013-03-29 MED ORDER — ATORVASTATIN CALCIUM 20 MG PO TABS: 20.0000 mg | ORAL_TABLET | Freq: Every day | ORAL | Status: DC

## 2013-03-29 NOTE — Addendum Note (Signed)
Addended by: Thompson Grayer on: 03/29/2013 04:25 PM   Modules accepted: Orders

## 2013-04-04 ENCOUNTER — Other Ambulatory Visit: Payer: Self-pay | Admitting: Adult Health

## 2013-04-25 ENCOUNTER — Other Ambulatory Visit: Payer: Self-pay | Admitting: Adult Health

## 2013-07-15 ENCOUNTER — Other Ambulatory Visit: Payer: Self-pay | Admitting: Cardiology

## 2013-11-02 ENCOUNTER — Telehealth: Payer: Self-pay | Admitting: Cardiology

## 2013-11-02 MED ORDER — PRASUGREL HCL 10 MG PO TABS: ORAL_TABLET | ORAL | Status: DC

## 2013-11-02 NOTE — Telephone Encounter (Signed)
Received fax refill request  Rx # [id number] Medication:  Effient 10 mg tablet Qty 30 Sig:  Take one tablet once daily Physician:  Domenic Polite

## 2013-11-13 ENCOUNTER — Other Ambulatory Visit: Payer: Self-pay | Admitting: Cardiology

## 2013-11-13 ENCOUNTER — Telehealth: Payer: Self-pay | Admitting: *Deleted

## 2013-11-13 MED ORDER — ATORVASTATIN CALCIUM 20 MG PO TABS: 20.0000 mg | ORAL_TABLET | Freq: Every day | ORAL | Status: DC

## 2013-11-13 NOTE — Telephone Encounter (Signed)
Message copied by Desma Mcgregor on Mon Nov 13, 2013 10:38 AM ------      Message from: MCDOWELL, Aloha Gell      Created: Mon Nov 13, 2013 10:36 AM       It should be Lipitor 20 mg daily. We reduced the dose to this based on lab work in December 2014. Thanks.            ----- Message -----         From: Desma Mcgregor, CMA         Sent: 11/13/2013  10:30 AM           To: Satira Sark, MD            Pt requesting refill of atorvastatin 1 tab 20mg  QD but LOV says atorvastatin 1/2 tab of 80mg . Please advise        ------

## 2014-04-11 ENCOUNTER — Encounter: Payer: Self-pay | Admitting: Cardiology

## 2014-04-11 ENCOUNTER — Encounter: Payer: Self-pay | Admitting: *Deleted

## 2014-04-11 ENCOUNTER — Ambulatory Visit (INDEPENDENT_AMBULATORY_CARE_PROVIDER_SITE_OTHER): Payer: BC Managed Care – PPO | Admitting: Cardiology

## 2014-04-11 VITALS — BP 130/94 | HR 92 | Ht 68.5 in | Wt 218.0 lb

## 2014-04-11 DIAGNOSIS — E782 Mixed hyperlipidemia: Secondary | ICD-10-CM

## 2014-04-11 DIAGNOSIS — I25810 Atherosclerosis of coronary artery bypass graft(s) without angina pectoris: Secondary | ICD-10-CM

## 2014-04-11 DIAGNOSIS — E785 Hyperlipidemia, unspecified: Secondary | ICD-10-CM

## 2014-04-11 DIAGNOSIS — Z23 Encounter for immunization: Secondary | ICD-10-CM

## 2014-04-11 DIAGNOSIS — I2510 Atherosclerotic heart disease of native coronary artery without angina pectoris: Secondary | ICD-10-CM

## 2014-04-11 DIAGNOSIS — I10 Essential (primary) hypertension: Secondary | ICD-10-CM

## 2014-04-11 DIAGNOSIS — R0602 Shortness of breath: Secondary | ICD-10-CM

## 2014-04-11 NOTE — Assessment & Plan Note (Signed)
He has noticed more shortness of breath with exertion over the last 6 months. ECG today is reviewed without significant change. Last intervention was DES to the LAD/diagonal bifurcation in 2012. We will continue medical therapy and follow-up with Lexiscan Cardiolite to reassess ischemic burden. If low risk, we will continue annual follow-up, otherwise can discuss further.

## 2014-04-11 NOTE — Progress Notes (Signed)
Reason for visit: CAD, hyperlipidemia  Clinical Summary Mr. Cassin is a 59 y.o.male last seen in December 2014. He presents for a routine visit today. 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 This situation has just changed recently, he may be doing different work over the next year, hopes to retire after that. Within the last 6 months, he has had more exertional shortness of breath, but no chest tightness. He has not used nitroglycerin. ECG today shows sinus rhythm with low voltage and decreased R wave progression, no significant changes. He remains on DAPT with bifurcational DES to the LAD/diagonal in the setting of ACS and diabetes. He otherwise continues on Coreg and lisinopril.  Lipitor was decreased to 20 mg daily back in December 2014. At that time LFTs were normal, cholesterol 89, triglycerides 115, HDL 24, and LDL 42. He has had some leg cramping at night time. No claudication.  He has not been exercising, this has been difficult because of his night work. He reports no orthopnea or PND at night time. No leg edema described.  No Known Allergies  Current Outpatient Prescriptions  Medication Sig Dispense Refill  . aspirin 81 MG tablet Take 81 mg by mouth daily.      Marland Kitchen atorvastatin (LIPITOR) 20 MG tablet Take 1 tablet (20 mg total) by mouth daily. 30 tablet 6  . carvedilol (COREG) 12.5 MG tablet TAKE (1) TABLET TWICE DAILY. 60 tablet 3  . carvedilol (COREG) 12.5 MG tablet TAKE (1) TABLET TWICE DAILY. 60 tablet 8  . FORA LANCETS MISC     . FORA V30A BLOOD GLUCOSE TEST test strip     . insulin glargine (LANTUS) 100 UNIT/ML injection Inject 15 Units into the skin at bedtime.    . Liraglutide (VICTOZA) 18 MG/3ML SOLN Inject 1.8 mg into the skin daily.      Marland Kitchen lisinopril (PRINIVIL,ZESTRIL) 20 MG tablet TAKE (1) TABLET TWICE DAILY. 60 tablet 6  . metFORMIN (GLUCOPHAGE) 500 MG tablet Take 1,000 mg by mouth 2 (two) times daily with a  meal.     . NITROSTAT 0.4 MG SL tablet Place 0.4 mg under the tongue every 5 (five) minutes as needed.     . prasugrel (EFFIENT) 10 MG TABS tablet TAKE 1 TABLET BY MOUTH ONCE DAILY. PATIENT NEEDS TO BE SEEN. 30 tablet 1  . QC PEN NEEDLES 31G X 8 MM MISC     . zolpidem (AMBIEN) 10 MG tablet Take 10 mg by mouth at bedtime as needed.       No current facility-administered medications for this visit.    Past Medical History  Diagnosis Date  . Coronary atherosclerosis of native coronary artery     DES distal circumflex 2005, DES LAD/diagonal bifurcation 7/12  . Type 2 diabetes mellitus   . Essential hypertension, benign   . Mixed hyperlipidemia   . Myocardial infarction     Anterolateral with VF arrest 7/12  . Diabetes mellitus     Social History Mr. Donado reports that he has never smoked. He has never used smokeless tobacco. Mr. Amon reports that he does not drink alcohol.  Review of Systems Complete review of systems negative except as otherwise outlined in the clinical summary and also the following. Appetite has been stable. No cough, fevers or chills. He did request a flu shot today.  Physical Examination Filed Vitals:   04/11/14 0855  BP: 130/94  Pulse: 92  Filed Weights   04/11/14 0855  Weight: 218 lb (98.884 kg)    Overweight male in no acute distress.  HEENT: Conjunctiva and lids normal, oropharynx with moist mucosa.  Neck: Supple, no elevated JVP or carotid bruits.  Lungs: Clear to auscultation, nonlabored.  Cardiac: Regular rate and rhythm, no S3 gallop.  Abdomen: Soft, nontender, bowel sounds present, no bruits.  Skin: Warm and dry.  Extremities: No pitting edema, distal pulses 1-2+.  Musculoskeletal: No kyphosis. Neuropsychiatric: Alert and oriented 3, affect appropriate.   Problem List and Plan   Coronary atherosclerosis of native coronary artery He has noticed more shortness of breath with exertion over the last 6 months. ECG today is  reviewed without significant change. Last intervention was DES to the LAD/diagonal bifurcation in 2012. We will continue medical therapy and follow-up with Lexiscan Cardiolite to reassess ischemic burden. If low risk, we will continue annual follow-up, otherwise can discuss further.   Essential hypertension, benign Diastolic elevated today. No change to current medical regimen at this time. Exercise regimen would be of benefit. May be more the possibility as his job situation changes.   Mixed hyperlipidemia He continues on Lipitor 20 mg daily. Follow-up FLP and LFTs. May want to reduce further if lipids remain well controlled, particular with some leg cramping.     Satira Sark, M.D., F.A.C.C.

## 2014-04-11 NOTE — Patient Instructions (Signed)
Your physician wants you to follow-up in: 1 year with Dr. Domenic Polite. You will receive a reminder letter in the mail two months in advance. If you don't receive a letter, please call our office to schedule the follow-up appointment.   Your physician has requested that you have a lexiscan myoview. For further information please visit [email or web address]. Please follow instruction sheet, as given.  Your physician recommends that you continue on your current medications as directed. Please refer to the Current Medication list given to you today.  Your physician recommends that you return for lab work (Fasting) (Liver, Lipid)  Thank you for choosing [facility]!

## 2014-04-11 NOTE — Assessment & Plan Note (Signed)
He continues on Lipitor 20 mg daily. Follow-up FLP and LFTs. May want to reduce further if lipids remain well controlled, particular with some leg cramping.

## 2014-04-11 NOTE — Assessment & Plan Note (Signed)
Diastolic elevated today. No change to current medical regimen at this time. Exercise regimen would be of benefit. May be more the possibility as his job situation changes.

## 2014-04-12 ENCOUNTER — Other Ambulatory Visit: Payer: Self-pay | Admitting: Cardiology

## 2014-04-12 LAB — LIPID PANEL
CHOL/HDL RATIO: 3.9 ratio
Cholesterol: 86 mg/dL (ref 0–200)
HDL: 22 mg/dL — AB (ref >39)
LDL CALC: 35 mg/dL (ref 0–99)
Triglycerides: 146 mg/dL (ref <150)
VLDL: 29 mg/dL (ref 0–40)

## 2014-04-12 LAB — HEPATIC FUNCTION PANEL
ALT: 25 U/L (ref 0–53)
AST: 13 U/L (ref 0–37)
Albumin: 3.9 g/dL (ref 3.5–5.2)
Alkaline Phosphatase: 48 U/L (ref 39–117)
Bilirubin, Direct: 0.1 mg/dL (ref 0.0–0.3)
Indirect Bilirubin: 0.6 mg/dL (ref 0.2–1.2)
Total Bilirubin: 0.7 mg/dL (ref 0.2–1.2)
Total Protein: 6.6 g/dL (ref 6.0–8.3)

## 2014-04-13 ENCOUNTER — Telehealth: Payer: Self-pay

## 2014-04-13 MED ORDER — ATORVASTATIN CALCIUM 10 MG PO TABS: 10.0000 mg | ORAL_TABLET | Freq: Every day | ORAL | Status: DC

## 2014-04-13 NOTE — Addendum Note (Signed)
Addended by: Levonne Hubert on: 04/13/2014 09:34 AM   Modules accepted: Orders

## 2014-04-13 NOTE — Telephone Encounter (Signed)
-----   Message from Satira Sark, MD sent at 04/13/2014  8:24 AM EST ----- Reviewed. Please let him know that LFTs are normal, lipid still remain quite low, total 86 and LDL 35. Have him cut Lipitor back to 10 mg daily.

## 2014-04-13 NOTE — Telephone Encounter (Signed)
Pt made aware,new rx escribed to [facility] for lipitor 10 mg daily

## 2014-04-17 ENCOUNTER — Encounter (HOSPITAL_COMMUNITY)
Admission: RE | Admit: 2014-04-17 | Discharge: 2014-04-17 | Disposition: A | Discharge status: Home / Self Care | Payer: BC Managed Care – PPO | Source: Ambulatory Visit | Attending: Cardiology | Admitting: Cardiology

## 2014-04-17 ENCOUNTER — Ambulatory Visit (HOSPITAL_COMMUNITY)
Admission: RE | Admit: 2014-04-17 | Discharge: 2014-04-17 | Disposition: A | Discharge status: Home / Self Care | Payer: BC Managed Care – PPO | Source: Ambulatory Visit | Attending: Cardiology | Admitting: Cardiology

## 2014-04-17 ENCOUNTER — Encounter (HOSPITAL_COMMUNITY): Payer: Self-pay

## 2014-04-17 DIAGNOSIS — I2510 Atherosclerotic heart disease of native coronary artery without angina pectoris: Secondary | ICD-10-CM | POA: Diagnosis not present

## 2014-04-17 DIAGNOSIS — I25810 Atherosclerosis of coronary artery bypass graft(s) without angina pectoris: Secondary | ICD-10-CM

## 2014-04-17 DIAGNOSIS — R0609 Other forms of dyspnea: Secondary | ICD-10-CM | POA: Insufficient documentation

## 2014-04-17 DIAGNOSIS — R0602 Shortness of breath: Secondary | ICD-10-CM

## 2014-04-17 MED ORDER — TECHNETIUM TC 99M SESTAMIBI - CARDIOLITE
10.0000 | Freq: Once | INTRAVENOUS | Status: AC | PRN
  Administered 2014-04-17: 09:00:00 10 via INTRAVENOUS

## 2014-04-17 MED ORDER — REGADENOSON 0.4 MG/5ML IV SOLN
INTRAVENOUS | Status: AC
  Administered 2014-04-17: 0.4 mg via INTRAVENOUS
  Filled 2014-04-17: qty 5

## 2014-04-17 MED ORDER — SODIUM CHLORIDE 0.9 % IJ SOLN
INTRAMUSCULAR | Status: AC
  Administered 2014-04-17: 10 mL via INTRAVENOUS
  Filled 2014-04-17: qty 3

## 2014-04-17 MED ORDER — TECHNETIUM TC 99M SESTAMIBI GENERIC - CARDIOLITE
30.0000 | Freq: Once | INTRAVENOUS | Status: AC | PRN
  Administered 2014-04-17: 30 via INTRAVENOUS

## 2014-04-17 MED ORDER — REGADENOSON 0.4 MG/5ML IV SOLN
0.4000 mg | Freq: Once | INTRAVENOUS | Status: AC | PRN
  Administered 2014-04-17: 0.4 mg via INTRAVENOUS

## 2014-04-17 MED ORDER — SODIUM CHLORIDE 0.9 % IJ SOLN
10.0000 mL | INTRAMUSCULAR | Status: DC | PRN
  Administered 2014-04-17: 10 mL via INTRAVENOUS
  Filled 2014-04-17: qty 10

## 2014-04-17 NOTE — Progress Notes (Signed)
Stress Lab Nurses Notes - [name]  [name] [name] 04/17/2014 Reason for doing test: CAD and DOE Type of test: Wille Glaser Nurse performing test: Gerrit Halls, RN Nuclear Medicine Tech: Melburn Hake Echo Tech: Not Applicable MD performing test: S. McDowell/K.Purcell Nails NP Family MD: Luan Pulling Test explained and consent signed: Yes.   IV started: Saline lock flushed, No redness or edema and Saline lock started in radiology Symptoms: Chest tightness & SOB Treatment/Intervention: None Reason test stopped: protocol completed After recovery IV was: Discontinued via X-ray tech and No redness or edema Patient to return to Nuc. Med at : 11:30 Patient discharged: Home Patient's Condition upon discharge was: stable Comments: During test BP 149/79 & HR 108 .  Recovery BP 153/81 & HR 100.  Symptoms resolved in recovery. [name] T

## 2014-05-12 ENCOUNTER — Other Ambulatory Visit: Payer: Self-pay | Admitting: Cardiology

## 2014-06-08 ENCOUNTER — Emergency Department (HOSPITAL_COMMUNITY): Payer: BC Managed Care – PPO

## 2014-06-08 ENCOUNTER — Encounter (HOSPITAL_COMMUNITY): Payer: Self-pay | Admitting: *Deleted

## 2014-06-08 ENCOUNTER — Emergency Department (HOSPITAL_COMMUNITY)
Admission: EM | Admit: 2014-06-08 | Discharge: 2014-06-08 | Disposition: A | Discharge status: Home / Self Care | Payer: BC Managed Care – PPO | Attending: Emergency Medicine | Admitting: Emergency Medicine

## 2014-06-08 DIAGNOSIS — Z794 Long term (current) use of insulin: Secondary | ICD-10-CM | POA: Diagnosis not present

## 2014-06-08 DIAGNOSIS — K4020 Bilateral inguinal hernia, without obstruction or gangrene, not specified as recurrent: Secondary | ICD-10-CM | POA: Diagnosis not present

## 2014-06-08 DIAGNOSIS — R109 Unspecified abdominal pain: Secondary | ICD-10-CM

## 2014-06-08 DIAGNOSIS — I252 Old myocardial infarction: Secondary | ICD-10-CM | POA: Insufficient documentation

## 2014-06-08 DIAGNOSIS — Z79899 Other long term (current) drug therapy: Secondary | ICD-10-CM | POA: Insufficient documentation

## 2014-06-08 DIAGNOSIS — E782 Mixed hyperlipidemia: Secondary | ICD-10-CM | POA: Diagnosis not present

## 2014-06-08 DIAGNOSIS — N200 Calculus of kidney: Secondary | ICD-10-CM | POA: Insufficient documentation

## 2014-06-08 DIAGNOSIS — R000 Tachycardia, unspecified: Secondary | ICD-10-CM | POA: Diagnosis not present

## 2014-06-08 DIAGNOSIS — Z7982 Long term (current) use of aspirin: Secondary | ICD-10-CM | POA: Insufficient documentation

## 2014-06-08 DIAGNOSIS — Z9889 Other specified postprocedural states: Secondary | ICD-10-CM | POA: Insufficient documentation

## 2014-06-08 DIAGNOSIS — I10 Essential (primary) hypertension: Secondary | ICD-10-CM | POA: Diagnosis not present

## 2014-06-08 DIAGNOSIS — E119 Type 2 diabetes mellitus without complications: Secondary | ICD-10-CM | POA: Diagnosis not present

## 2014-06-08 DIAGNOSIS — I2510 Atherosclerotic heart disease of native coronary artery without angina pectoris: Secondary | ICD-10-CM | POA: Insufficient documentation

## 2014-06-08 DIAGNOSIS — K8020 Calculus of gallbladder without cholecystitis without obstruction: Secondary | ICD-10-CM | POA: Diagnosis not present

## 2014-06-08 LAB — CBC WITH DIFFERENTIAL/PLATELET
BASOS PCT: 0 % (ref 0–1)
Basophils Absolute: 0 10*3/uL (ref 0.0–0.1)
EOS PCT: 0 % (ref 0–5)
Eosinophils Absolute: 0 10*3/uL (ref 0.0–0.7)
HCT: 49.1 % (ref 39.0–52.0)
HEMOGLOBIN: 16.3 g/dL (ref 13.0–17.0)
LYMPHS PCT: 10 % — AB (ref 12–46)
Lymphs Abs: 1 10*3/uL (ref 0.7–4.0)
MCH: 28.1 pg (ref 26.0–34.0)
MCHC: 33.2 g/dL (ref 30.0–36.0)
MCV: 84.7 fL (ref 78.0–100.0)
MONO ABS: 0.9 10*3/uL (ref 0.1–1.0)
MONOS PCT: 9 % (ref 3–12)
NEUTROS ABS: 7.9 10*3/uL — AB (ref 1.7–7.7)
Neutrophils Relative %: 81 % — ABNORMAL HIGH (ref 43–77)
Platelets: 226 10*3/uL (ref 150–400)
RBC: 5.8 MIL/uL (ref 4.22–5.81)
RDW: 13.8 % (ref 11.5–15.5)
WBC: 9.9 10*3/uL (ref 4.0–10.5)

## 2014-06-08 LAB — COMPREHENSIVE METABOLIC PANEL
ALBUMIN: 4.8 g/dL (ref 3.5–5.2)
ALT: 45 U/L (ref 0–53)
AST: 33 U/L (ref 0–37)
Alkaline Phosphatase: 45 U/L (ref 39–117)
Anion gap: 10 (ref 5–15)
BUN: 26 mg/dL — ABNORMAL HIGH (ref 6–23)
CALCIUM: 9.5 mg/dL (ref 8.4–10.5)
CO2: 28 mmol/L (ref 19–32)
CREATININE: 1.41 mg/dL — AB (ref 0.50–1.35)
Chloride: 99 mmol/L (ref 96–112)
GFR calc Af Amer: 61 mL/min — ABNORMAL LOW (ref >90)
GFR, EST NON AFRICAN AMERICAN: 53 mL/min — AB (ref >90)
Glucose, Bld: 352 mg/dL — ABNORMAL HIGH (ref 70–99)
Potassium: 5.9 mmol/L — ABNORMAL HIGH (ref 3.5–5.1)
SODIUM: 137 mmol/L (ref 135–145)
TOTAL PROTEIN: 8 g/dL (ref 6.0–8.3)
Total Bilirubin: 1.1 mg/dL (ref 0.3–1.2)

## 2014-06-08 LAB — URINALYSIS, ROUTINE W REFLEX MICROSCOPIC
BILIRUBIN URINE: NEGATIVE
Glucose, UA: >1000 mg/dL — AB
Ketones, ur: 40 mg/dL — AB
Leukocytes, UA: NEGATIVE
Nitrite: NEGATIVE
PROTEIN: NEGATIVE mg/dL
SPECIFIC GRAVITY, URINE: 1.02 (ref 1.005–1.030)
UROBILINOGEN UA: 0.2 mg/dL (ref 0.0–1.0)
pH: 5.5 (ref 5.0–8.0)

## 2014-06-08 LAB — URINE MICROSCOPIC-ADD ON

## 2014-06-08 LAB — LIPASE, BLOOD: LIPASE: 33 U/L (ref 11–59)

## 2014-06-08 MED ORDER — HYDROCODONE-ACETAMINOPHEN 5-325 MG PO TABS: 1.0000 | ORAL_TABLET | ORAL | Status: DC | PRN

## 2014-06-08 NOTE — ED Notes (Signed)
Woke up at 0600 with severe LLQ pain, radiating to back along with vomiting.

## 2014-06-08 NOTE — ED Provider Notes (Addendum)
CSN: [id number]     Arrival date & time 06/08/14  1056 History  This chart was scribed for Elnora Morrison, MD by Chester Holstein, ED Scribe. This patient was seen in room APA04/APA04 and the patient's care was started at 2:31 PM.    Chief Complaint  Patient presents with  . Abdominal Pain      Patient is a 59 y.o. male presenting with abdominal pain. The history is provided by the patient. No language interpreter was used.  Abdominal Pain Associated symptoms: nausea and vomiting   Associated symptoms: no chest pain, no dysuria and no hematuria    HPI Comments: [name] [name] is a 59 y.o. male with PMHx of DM, HTN, HLD, MI who presents to the Emergency Department complaining of sharp left sided abdominal pain with onset around 6:15 AM. Pt notes pain radiates to back. Pt notes associated nausea which has resolved. Pt notes h/o inguinal hernia repair. Pt is not a smoker. Pt denies dysuria, hematuria, chest pain, and headache. Pt states he is feeling well currently.   Past Medical History  Diagnosis Date  . Coronary atherosclerosis of native coronary artery     DES distal circumflex 2005, DES LAD/diagonal bifurcation 7/12  . Type 2 diabetes mellitus   . Essential hypertension, benign   . Mixed hyperlipidemia   . Myocardial infarction     Anterolateral with VF arrest 7/12  . Diabetes mellitus    Past Surgical History  Procedure Laterality Date  . Inguinal hernia repair    . Cardiac catheterization    . Coronary angioplasty    . Carotid stent     Family History  Problem Relation Age of Onset  . Hypertension    . Hyperlipidemia Mother   . Hypertension Mother   . Hyperlipidemia Father   . Hypertension Father    History  Substance Use Topics  . Smoking status: Never Smoker   . Smokeless tobacco: Never Used  . Alcohol Use: No    Review of Systems  Cardiovascular: Negative for chest pain.  Gastrointestinal: Positive for nausea, vomiting and abdominal pain.  Genitourinary:  Negative for dysuria and hematuria.  Musculoskeletal: Positive for back pain.  Neurological: Negative for headaches.  All other systems reviewed and are negative.     Allergies  Review of patient's allergies indicates no known allergies.  Home Medications   Prior to Admission medications   Medication Sig Start Date End Date Taking? Authorizing Provider  aspirin 81 MG tablet Take 81 mg by mouth daily.     Yes Historical Provider, MD  atorvastatin (LIPITOR) 10 MG tablet Take 1 tablet (10 mg total) by mouth daily. 04/13/14  Yes Satira Sark, MD  carvedilol (COREG) 12.5 MG tablet TAKE (1) TABLET TWICE DAILY. 07/18/12  Yes Satira Sark, MD  CINNAMON PO Take 1 tablet by mouth 2 (two) times daily.   Yes Historical Provider, MD  CRANBERRY PO Take 1 tablet by mouth 2 (two) times daily.   Yes Historical Provider, MD  insulin glargine (LANTUS) 100 UNIT/ML injection Inject 15 Units into the skin at bedtime.   Yes Historical Provider, MD  Liraglutide (VICTOZA) 18 MG/3ML SOLN Inject 1.8 mg into the skin daily.     Yes Historical Provider, MD  lisinopril (PRINIVIL,ZESTRIL) 20 MG tablet TAKE (1) TABLET TWICE DAILY. 04/25/13  Yes Satira Sark, MD  loratadine (CLARITIN) 10 MG tablet Take 10 mg by mouth daily.   Yes Historical Provider, MD  metFORMIN (GLUCOPHAGE) 500 MG tablet  Take 1,000 mg by mouth 2 (two) times daily with a meal.    Yes Historical Provider, MD  NITROSTAT 0.4 MG SL tablet Place 0.4 mg under the tongue every 5 (five) minutes as needed.  07/16/11  Yes Historical Provider, MD  prasugrel (EFFIENT) 10 MG TABS tablet TAKE 1 TABLET BY MOUTH ONCE DAILY. PATIENT NEEDS TO BE SEEN. 11/02/13  Yes Satira Sark, MD  zolpidem (AMBIEN) 10 MG tablet Take 10 mg by mouth at bedtime as needed for sleep.    Yes Historical Provider, MD  carvedilol (COREG) 12.5 MG tablet TAKE (1) TABLET TWICE DAILY. Patient not taking: Reported on 06/08/2014 05/14/14   Satira Sark, MD  FORA LANCETS [place]   03/15/13   Historical Provider, MD  FORA [phone] BLOOD GLUCOSE TEST test strip  03/15/13   Historical Provider, MD  HYDROcodone-acetaminophen (NORCO) 5-325 MG per tablet Take 1-2 tablets by mouth every 4 (four) hours as needed. 06/08/14   Elnora Morrison, MD  QC PEN NEEDLES 31G X 8 MM MISC  03/27/13   Historical Provider, MD   BP 136/87 mmHg  Pulse 106  Temp(Src) 97.7 F (36.5 C) (Oral)  Resp 20  Ht 5\' 8"  (1.727 m)  Wt 215 lb (97.523 kg)  BMI 32.70 kg/m2  SpO2 98% Physical Exam  Constitutional: He is oriented to person, place, and time. He appears well-developed and well-nourished.  HENT:  Head: Normocephalic.  Mild dry mucous membranes  Eyes: Conjunctivae are normal.  Neck: Normal range of motion. Neck supple.  Cardiovascular: Regular rhythm and normal heart sounds.  Tachycardia present.   Pulmonary/Chest: Effort normal and breath sounds normal. No respiratory distress. He has no wheezes. He has no rales.  Abdominal: Bowel sounds are decreased. There is no tenderness.  Musculoskeletal: Normal range of motion.  Neurological: He is alert and oriented to person, place, and time.  Skin: Skin is warm and dry.  Psychiatric: He has a normal mood and affect. His behavior is normal.  Nursing note and vitals reviewed.   ED Course  Procedures (including critical care time) DIAGNOSTIC STUDIES: Oxygen Saturation is 97% on room air, normal by my interpretation.    COORDINATION OF CARE: 2:36 PM Discussed treatment plan with patient at beside, the patient agrees with the plan and has no further questions at this time.   Labs Review Labs Reviewed  COMPREHENSIVE METABOLIC PANEL - Abnormal; Notable for the following:    Potassium 5.9 (*)    Glucose, Bld 352 (*)    BUN 26 (*)    Creatinine, Ser 1.41 (*)    GFR calc non Af Amer 53 (*)    GFR calc Af Amer 61 (*)    All other components within normal limits  CBC WITH DIFFERENTIAL/PLATELET - Abnormal; Notable for the following:    Neutrophils  Relative % 81 (*)    Neutro Abs 7.9 (*)    Lymphocytes Relative 10 (*)    All other components within normal limits  URINALYSIS, ROUTINE W REFLEX MICROSCOPIC - Abnormal; Notable for the following:    Glucose, UA >1000 (*)    Hgb urine dipstick MODERATE (*)    Ketones, ur 40 (*)    All other components within normal limits  LIPASE, BLOOD  URINE MICROSCOPIC-ADD ON    Imaging Review Ct Renal Stone Study  06/08/2014   CLINICAL DATA:  Left flank pain since 6 a.m. Diabetes. Hypertension. Coronary artery disease.  EXAM: CT ABDOMEN AND PELVIS WITHOUT CONTRAST  TECHNIQUE: Multidetector CT  imaging of the abdomen and pelvis was performed following the standard protocol without IV contrast.  COMPARISON:  None.  FINDINGS: Lower chest:  Coronary artery atherosclerotic calcification.  Hepatobiliary: Diffuse hepatic steatosis. Small dependent gallstones in the gallbladder, somewhat nitrogen gas phenomenon.  Pancreas: Unremarkable  Spleen: Punctate calcifications in the upper spleen, probably from old granulomatous disease.  Adrenals/Urinary Tract: Bilateral perirenal stranding, slightly greater on the left than the right, with mild left caliectasis and mild left hydroureter extending down to a 1-2 mm left UVJ calculus, image 73 series 3.  Stomach/Bowel: Unremarkable  Vascular/Lymphatic: Aortoiliac atherosclerotic vascular disease. Small peripancreatic and porta hepatis nodes are not pathologically enlarged by size criteria.  Reproductive: Enlarged prostate gland, 5.8 by 4.5 cm on image 84 series 2.  Other: No supplemental non-categorized findings.  Musculoskeletal: Bilateral inguinal hernias contain adipose tissue, left greater than right. Degenerative disc disease and right eccentric posterior osseous ridging at L5-S1. Disc bulge diffusely at L4-5 with facet arthropathy. Interbody bridging spurring in the distal thoracic spine.  IMPRESSION: 1. Partially obstructive 1-2 mm left UVJ stone with associated mild  hydroureter and mild caliectasis. 2. Other findings include coronary artery and aortoiliac atherosclerosis; diffuse hepatic steatosis ; cholelithiasis; enlarged prostate gland; bilateral inguinal hernias containing adipose tissue; and lumbar spondylosis with degenerative disc disease.   Electronically Signed   By: Van Clines M.D.   On: 06/08/2014 16:55     EKG Interpretation None      MDM   Final diagnoses:  Acute left flank pain  Kidney stone on left side  Bilateral inguinal hernia without obstruction or gangrene, recurrence not specified  Gallstones  ARF  I personally performed the services described in this documentation, which was scribed in my presence. The recorded information has been reviewed and is accurate.  Patient presents with intermittent left flank pain. With no history of kidney stones even though clinically concerning for CT scan ordered [place] CTP CT scan results review joint kidney stones, also gallbladder showed stones, enlarged prostate inguinal hernias. Patient requiring outpatient follow-up with urology and primary doctor. Pain controlled in ER.  Results and differential diagnosis were discussed with the patient/parent/guardian. Close follow up outpatient was discussed, comfortable with the plan.   Medications - No data to display  Filed Vitals:   06/08/14 1500 06/08/14 1530 06/08/14 1600 06/08/14 1630  BP: 144/86 136/90 137/87 136/87  Pulse: 102 104 101 106  Temp:      TempSrc:      Resp:      Height:      Weight:      SpO2: 95% 96% 94% 98%    Final diagnoses:  Acute left flank pain  Kidney stone on left side  Bilateral inguinal hernia without obstruction or gangrene, recurrence not specified  Gallstones      Elnora Morrison, MD 06/08/14 1711  Elnora Morrison, MD 06/08/14 1711

## 2014-06-08 NOTE — Discharge Instructions (Signed)
Follow-up with her primary Dr. And urology as discussed.  If you were given medicines take as directed.  If you are on coumadin or contraceptives realize their levels and effectiveness is altered by many different medicines.  If you have any reaction (rash, tongues swelling, other) to the medicines stop taking and see a physician.   Please follow up as directed and return to the ER or see a physician for new or worsening symptoms.  Thank you. Filed Vitals:   06/08/14 1500 06/08/14 1530 06/08/14 1600 06/08/14 1630  BP: 144/86 136/90 137/87 136/87  Pulse: 102 104 101 106  Temp:      TempSrc:      Resp:      Height:      Weight:      SpO2: 95% 96% 94% 98%   For severe pain take norco or vicodin however realize they have the potential for addiction and it can make you sleepy and has tylenol in it.  No operating machinery while taking.

## 2014-11-10 ENCOUNTER — Other Ambulatory Visit: Payer: Self-pay | Admitting: Cardiology

## 2014-12-11 ENCOUNTER — Other Ambulatory Visit: Payer: Self-pay | Admitting: Cardiology

## 2015-01-07 ENCOUNTER — Encounter (HOSPITAL_BASED_OUTPATIENT_CLINIC_OR_DEPARTMENT_OTHER): Payer: Self-pay | Admitting: *Deleted

## 2015-01-07 ENCOUNTER — Other Ambulatory Visit: Payer: Self-pay | Admitting: Orthopedic Surgery

## 2015-01-08 ENCOUNTER — Ambulatory Visit (HOSPITAL_BASED_OUTPATIENT_CLINIC_OR_DEPARTMENT_OTHER): Payer: BC Managed Care – PPO | Admitting: Anesthesiology

## 2015-01-08 ENCOUNTER — Encounter (HOSPITAL_BASED_OUTPATIENT_CLINIC_OR_DEPARTMENT_OTHER): Payer: Self-pay | Admitting: Orthopedic Surgery

## 2015-01-08 ENCOUNTER — Ambulatory Visit (HOSPITAL_BASED_OUTPATIENT_CLINIC_OR_DEPARTMENT_OTHER)
Admission: RE | Admit: 2015-01-08 | Discharge: 2015-01-08 | Disposition: A | Discharge status: Home / Self Care | Payer: BC Managed Care – PPO | Source: Ambulatory Visit | Attending: Orthopedic Surgery | Admitting: Orthopedic Surgery

## 2015-01-08 ENCOUNTER — Encounter (HOSPITAL_BASED_OUTPATIENT_CLINIC_OR_DEPARTMENT_OTHER)
Admission: RE | Disposition: A | Discharge status: Home / Self Care | Payer: Self-pay | Source: Ambulatory Visit | Attending: Orthopedic Surgery

## 2015-01-08 DIAGNOSIS — I252 Old myocardial infarction: Secondary | ICD-10-CM | POA: Insufficient documentation

## 2015-01-08 DIAGNOSIS — E119 Type 2 diabetes mellitus without complications: Secondary | ICD-10-CM | POA: Insufficient documentation

## 2015-01-08 DIAGNOSIS — Z794 Long term (current) use of insulin: Secondary | ICD-10-CM | POA: Insufficient documentation

## 2015-01-08 DIAGNOSIS — Z79899 Other long term (current) drug therapy: Secondary | ICD-10-CM | POA: Insufficient documentation

## 2015-01-08 DIAGNOSIS — Z7902 Long term (current) use of antithrombotics/antiplatelets: Secondary | ICD-10-CM | POA: Diagnosis not present

## 2015-01-08 DIAGNOSIS — I2510 Atherosclerotic heart disease of native coronary artery without angina pectoris: Secondary | ICD-10-CM | POA: Diagnosis not present

## 2015-01-08 DIAGNOSIS — Z6832 Body mass index (BMI) 32.0-32.9, adult: Secondary | ICD-10-CM | POA: Insufficient documentation

## 2015-01-08 DIAGNOSIS — I10 Essential (primary) hypertension: Secondary | ICD-10-CM | POA: Diagnosis not present

## 2015-01-08 DIAGNOSIS — M65331 Trigger finger, right middle finger: Secondary | ICD-10-CM | POA: Insufficient documentation

## 2015-01-08 DIAGNOSIS — E782 Mixed hyperlipidemia: Secondary | ICD-10-CM | POA: Insufficient documentation

## 2015-01-08 DIAGNOSIS — Z955 Presence of coronary angioplasty implant and graft: Secondary | ICD-10-CM | POA: Diagnosis not present

## 2015-01-08 DIAGNOSIS — Z7982 Long term (current) use of aspirin: Secondary | ICD-10-CM | POA: Diagnosis not present

## 2015-01-08 DIAGNOSIS — Z7984 Long term (current) use of oral hypoglycemic drugs: Secondary | ICD-10-CM | POA: Insufficient documentation

## 2015-01-08 DIAGNOSIS — F419 Anxiety disorder, unspecified: Secondary | ICD-10-CM | POA: Diagnosis not present

## 2015-01-08 DIAGNOSIS — Z791 Long term (current) use of non-steroidal anti-inflammatories (NSAID): Secondary | ICD-10-CM | POA: Insufficient documentation

## 2015-01-08 HISTORY — PX: TRIGGER FINGER RELEASE: SHX641

## 2015-01-08 HISTORY — DX: Anxiety disorder, unspecified: F41.9

## 2015-01-08 LAB — POCT I-STAT, CHEM 8
BUN: 23 mg/dL — AB (ref 6–20)
Calcium, Ion: 1.12 mmol/L — ABNORMAL LOW (ref 1.13–1.30)
Chloride: 102 mmol/L (ref 101–111)
Creatinine, Ser: 0.8 mg/dL (ref 0.61–1.24)
Glucose, Bld: 276 mg/dL — ABNORMAL HIGH (ref 65–99)
HEMATOCRIT: 48 % (ref 39.0–52.0)
HEMOGLOBIN: 16.3 g/dL (ref 13.0–17.0)
Potassium: 4.4 mmol/L (ref 3.5–5.1)
SODIUM: 138 mmol/L (ref 135–145)
TCO2: 22 mmol/L (ref 0–100)

## 2015-01-08 LAB — GLUCOSE, CAPILLARY: Glucose-Capillary: 264 mg/dL — ABNORMAL HIGH (ref 65–99)

## 2015-01-08 SURGERY — RELEASE, A1 PULLEY, FOR TRIGGER FINGER
Anesthesia: Monitor Anesthesia Care | Site: Finger | Laterality: Right

## 2015-01-08 MED ORDER — CHLORHEXIDINE GLUCONATE 4 % EX LIQD: 60.0000 mL | Freq: Once | CUTANEOUS | Status: DC

## 2015-01-08 MED ORDER — LACTATED RINGERS IV SOLN
INTRAVENOUS | Status: DC
  Administered 2015-01-08: 08:00:00 via INTRAVENOUS

## 2015-01-08 MED ORDER — FENTANYL CITRATE (PF) 100 MCG/2ML IJ SOLN
50.0000 ug | INTRAMUSCULAR | Status: DC | PRN
  Administered 2015-01-08: 50 ug via INTRAVENOUS

## 2015-01-08 MED ORDER — BUPIVACAINE HCL (PF) 0.25 % IJ SOLN
INTRAMUSCULAR | Status: AC
  Filled 2015-01-08: qty 30

## 2015-01-08 MED ORDER — OXYCODONE HCL 5 MG/5ML PO SOLN: 5.0000 mg | Freq: Once | ORAL | Status: DC | PRN

## 2015-01-08 MED ORDER — ONDANSETRON HCL 4 MG/2ML IJ SOLN
INTRAMUSCULAR | Status: DC | PRN
  Administered 2015-01-08: 4 mg via INTRAVENOUS

## 2015-01-08 MED ORDER — HYDROCODONE-ACETAMINOPHEN 5-325 MG PO TABS: 1.0000 | ORAL_TABLET | Freq: Four times a day (QID) | ORAL | Status: DC | PRN

## 2015-01-08 MED ORDER — FENTANYL CITRATE (PF) 100 MCG/2ML IJ SOLN
INTRAMUSCULAR | Status: AC
  Filled 2015-01-08: qty 4

## 2015-01-08 MED ORDER — MIDAZOLAM HCL 2 MG/2ML IJ SOLN
1.0000 mg | INTRAMUSCULAR | Status: DC | PRN
  Administered 2015-01-08: 1 mg via INTRAVENOUS

## 2015-01-08 MED ORDER — ONDANSETRON HCL 4 MG/2ML IJ SOLN
INTRAMUSCULAR | Status: AC
  Filled 2015-01-08: qty 2

## 2015-01-08 MED ORDER — HYDROMORPHONE HCL 1 MG/ML IJ SOLN: 0.2500 mg | INTRAMUSCULAR | Status: DC | PRN

## 2015-01-08 MED ORDER — OXYCODONE HCL 5 MG PO TABS: 5.0000 mg | ORAL_TABLET | Freq: Once | ORAL | Status: DC | PRN

## 2015-01-08 MED ORDER — CEFAZOLIN SODIUM-DEXTROSE 2-3 GM-% IV SOLR: 2.0000 g | INTRAVENOUS | Status: DC

## 2015-01-08 MED ORDER — PROPOFOL 500 MG/50ML IV EMUL
INTRAVENOUS | Status: DC | PRN
  Administered 2015-01-08: 100 ug/kg/min via INTRAVENOUS

## 2015-01-08 MED ORDER — CEFAZOLIN SODIUM-DEXTROSE 2-3 GM-% IV SOLR
INTRAVENOUS | Status: AC
Start: 2015-01-08 — End: 2015-01-08
  Filled 2015-01-08: qty 50

## 2015-01-08 MED ORDER — LIDOCAINE HCL (PF) 0.5 % IJ SOLN
INTRAMUSCULAR | Status: AC
  Filled 2015-01-08: qty 50

## 2015-01-08 MED ORDER — BUPIVACAINE HCL (PF) 0.25 % IJ SOLN
INTRAMUSCULAR | Status: DC | PRN
  Administered 2015-01-08: 5 mL

## 2015-01-08 MED ORDER — MIDAZOLAM HCL 2 MG/2ML IJ SOLN
INTRAMUSCULAR | Status: AC
  Filled 2015-01-08: qty 4

## 2015-01-08 MED ORDER — MEPERIDINE HCL 25 MG/ML IJ SOLN: 6.2500 mg | INTRAMUSCULAR | Status: DC | PRN

## 2015-01-08 MED ORDER — GLYCOPYRROLATE 0.2 MG/ML IJ SOLN: 0.2000 mg | Freq: Once | INTRAMUSCULAR | Status: DC | PRN

## 2015-01-08 MED ORDER — SCOPOLAMINE 1 MG/3DAYS TD PT72: 1.0000 | MEDICATED_PATCH | Freq: Once | TRANSDERMAL | Status: DC | PRN

## 2015-01-08 SURGICAL SUPPLY — 34 items
BANDAGE COBAN STERILE 2 (GAUZE/BANDAGES/DRESSINGS) ×3 IMPLANT
BLADE SURG 15 STRL LF DISP TIS (BLADE) ×1 IMPLANT
BLADE SURG 15 STRL SS (BLADE) ×3
BNDG CMPR 9X4 STRL LF SNTH (GAUZE/BANDAGES/DRESSINGS) ×1
BNDG ESMARK 4X9 LF (GAUZE/BANDAGES/DRESSINGS) ×2 IMPLANT
CHLORAPREP W/TINT 26ML (MISCELLANEOUS) ×3 IMPLANT
CORDS BIPOLAR (ELECTRODE) IMPLANT
COVER BACK TABLE 60X90IN (DRAPES) ×3 IMPLANT
COVER MAYO STAND STRL (DRAPES) ×3 IMPLANT
CUFF TOURNIQUET SINGLE 18IN (TOURNIQUET CUFF) ×2 IMPLANT
DECANTER SPIKE VIAL GLASS SM (MISCELLANEOUS) IMPLANT
DRAPE EXTREMITY T 121X128X90 (DRAPE) ×3 IMPLANT
DRAPE SURG 17X23 STRL (DRAPES) ×3 IMPLANT
GAUZE SPONGE 4X4 12PLY STRL (GAUZE/BANDAGES/DRESSINGS) ×3 IMPLANT
GAUZE XEROFORM 1X8 LF (GAUZE/BANDAGES/DRESSINGS) ×3 IMPLANT
GLOVE BIOGEL PI IND STRL 7.0 (GLOVE) IMPLANT
GLOVE BIOGEL PI IND STRL 8.5 (GLOVE) ×1 IMPLANT
GLOVE BIOGEL PI INDICATOR 7.0 (GLOVE) ×2
GLOVE BIOGEL PI INDICATOR 8.5 (GLOVE) ×2
GLOVE ECLIPSE 6.5 STRL STRAW (GLOVE) ×2 IMPLANT
GLOVE SURG ORTHO 8.0 STRL STRW (GLOVE) ×3 IMPLANT
GOWN STRL REUS W/ TWL LRG LVL3 (GOWN DISPOSABLE) ×1 IMPLANT
GOWN STRL REUS W/TWL LRG LVL3 (GOWN DISPOSABLE) ×3
GOWN STRL REUS W/TWL XL LVL3 (GOWN DISPOSABLE) ×3 IMPLANT
NDL PRECISIONGLIDE 27X1.5 (NEEDLE) ×1 IMPLANT
NEEDLE PRECISIONGLIDE 27X1.5 (NEEDLE) ×3 IMPLANT
NS IRRIG 1000ML POUR BTL (IV SOLUTION) ×3 IMPLANT
PACK BASIN DAY SURGERY FS (CUSTOM PROCEDURE TRAY) ×3 IMPLANT
STOCKINETTE 4X48 STRL (DRAPES) ×3 IMPLANT
SUT ETHILON 4 0 PS 2 18 (SUTURE) ×6 IMPLANT
SYR BULB 3OZ (MISCELLANEOUS) ×3 IMPLANT
SYR CONTROL 10ML LL (SYRINGE) ×3 IMPLANT
TOWEL OR 17X24 6PK STRL BLUE (TOWEL DISPOSABLE) ×6 IMPLANT
UNDERPAD 30X30 (UNDERPADS AND DIAPERS) ×1 IMPLANT

## 2015-01-08 NOTE — Op Note (Signed)
Dictation Number [id number]

## 2015-01-08 NOTE — Anesthesia Preprocedure Evaluation (Signed)
Anesthesia Evaluation  Patient identified by MRN, date of birth, ID band Patient awake    Reviewed: Allergy & Precautions, NPO status , Patient's Chart, lab work & pertinent test results  Airway Mallampati: II  TM Distance: >3 FB Neck ROM: Full    Dental  (+) Teeth Intact, Dental Advisory Given   Pulmonary    breath sounds clear to auscultation       Cardiovascular hypertension, Pt. on medications + CAD and + Past MI   Rhythm:Regular Rate:Normal     Neuro/Psych    GI/Hepatic   Endo/Other  diabetes, Well Controlled, Type 2, Insulin Dependent, Oral Hypoglycemic AgentsMorbid obesity  Renal/GU      Musculoskeletal   Abdominal   Peds  Hematology   Anesthesia Other Findings   Reproductive/Obstetrics                             Anesthesia Physical Anesthesia Plan  ASA: III  Anesthesia Plan: MAC and Bier Block   Post-op Pain Management:    Induction: Intravenous  Airway Management Planned: Simple Face Mask  Additional Equipment:   Intra-op Plan:   Post-operative Plan:   Informed Consent: I have reviewed the patients History and Physical, chart, labs and discussed the procedure including the risks, benefits and alternatives for the proposed anesthesia with the patient or authorized representative who has indicated his/her understanding and acceptance.   Dental advisory given  Plan Discussed with: CRNA, Anesthesiologist and Surgeon  Anesthesia Plan Comments:         Anesthesia Quick Evaluation

## 2015-01-08 NOTE — Anesthesia Postprocedure Evaluation (Signed)
  Anesthesia Post-op Note  Patient: [name] [name]  Procedure(s) Performed: Procedure(s): RELEASE TRIGGER FINGER/A-1 PULLEY RIGHT MIDDLE FINGER (Right)  Patient Location: PACU  Anesthesia Type: MAC, Bier Block   Level of Consciousness: awake, alert  and oriented  Airway and Oxygen Therapy: Patient Spontanous Breathing  Post-op Pain:   Post-op Assessment: Post-op Vital signs reviewed  Post-op Vital Signs: Reviewed  Last Vitals:  Filed Vitals:   01/08/15 0933  BP: 142/69  Pulse: 72  Temp: 36.6 C  Resp: 16    Complications: No apparent anesthesia complications

## 2015-01-08 NOTE — Discharge Instructions (Addendum)

## 2015-01-08 NOTE — Brief Op Note (Signed)
01/08/2015  9:03 AM  PATIENT:  [name] [name]  59 y.o. male  PRE-OPERATIVE DIAGNOSIS:  STENOSIS TENOSYNOVITIS RIGHT MIDDLE FINGER  POST-OPERATIVE DIAGNOSIS:  STENOSIS TENOSYNOVITIS RIGHT MIDDLE FINGER  PROCEDURE:  Procedure(s): RELEASE TRIGGER FINGER/A-1 PULLEY RIGHT MIDDLE FINGER (Right)  SURGEON:  Surgeon(s) and Role:    * Daryll Brod, MD - Primary  PHYSICIAN ASSISTANT:   ASSISTANTS: none   ANESTHESIA:   local and regional  EBL:     BLOOD ADMINISTERED:none  DRAINS: none   LOCAL MEDICATIONS USED:  BUPIVICAINE   SPECIMEN:  No Specimen  DISPOSITION OF SPECIMEN:  N/A  COUNTS:  YES  TOURNIQUET:   Total Tourniquet Time Documented: Forearm (Right) - 16 minutes Total: Forearm (Right) - 16 minutes   DICTATION: .Other Dictation: Dictation Number [id number]  PLAN OF CARE: Discharge to home after PACU  PATIENT DISPOSITION:  PACU - hemodynamically stable.

## 2015-01-08 NOTE — Transfer of Care (Signed)
Immediate Anesthesia Transfer of Care Note  Patient: [name] [name]  Procedure(s) Performed: Procedure(s): RELEASE TRIGGER FINGER/A-1 PULLEY RIGHT MIDDLE FINGER (Right)  Patient Location: PACU  Anesthesia Type:Bier block  Level of Consciousness: awake, alert  and patient cooperative  Airway & Oxygen Therapy: Patient Spontanous Breathing and Patient connected to face mask oxygen  Post-op Assessment: Report given to RN and Post -op Vital signs reviewed and stable  Post vital signs: Reviewed and stable  Last Vitals:  Filed Vitals:   01/08/15 0742  BP: 145/91  Pulse: 80  Temp: 36.6 C  Resp: 16    Complications: No apparent anesthesia complications

## 2015-01-08 NOTE — H&P (Signed)
  [name] [name] is a 59 year old right hand dominant male who comes in complaining of catching in his right middle finger with pain and swelling. He states he has had trigger fingers in the past. He has had a heart attack. He is referred by Dr. Luan Pulling. He is on Effient. He has no history of injury. He complains of throbbing pain. He is not complaining of any numbness or tingling.  PAST MEDICAL HISTORY:  He has no known drug allergies. He is on Metformin, Carvedilol, atorvastatin, Effient, Lisinopril, aspirin, Lantus, and Victoza. He has had a hernia repair, tumor removal and stent.  FAMILY MEDICAL HISTORY: Positive for diabetes, heart disease and high BP.  SOCIAL HISTORY:  He does not smoke or use alcohol. 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  REVIEW OF SYSTEMS: Positive for glasses, high BP, heart attack, easy bleeding, otherwise negative 14 points.  [name] [name] is an 59 y.o. male.   Chief Complaint: catching right middle finger HPI: see above  Past Medical History  Diagnosis Date  . Coronary atherosclerosis of native coronary artery     DES distal circumflex 2005, DES LAD/diagonal bifurcation 7/12  . Type 2 diabetes mellitus ([place])   . Essential hypertension, benign   . Mixed hyperlipidemia   . Myocardial infarction ([place])     Anterolateral with VF arrest 7/12  . Diabetes mellitus   . Anxiety     Past Surgical History  Procedure Laterality Date  . Cardiac catheterization    . Coronary angioplasty    . Carotid stent    . Inguinal hernia repair      RIGHT GROIN    Family History  Problem Relation Age of Onset  . Hypertension    . Hyperlipidemia Mother   . Hypertension Mother   . Hyperlipidemia Father   . Hypertension Father    Social History:  reports that he has never smoked. He has never used smokeless tobacco. He reports that he does not drink alcohol or use illicit drugs.  Allergies: No Known Allergies  No prescriptions prior to admission    No results  found for this or any previous visit (from the past 48 hour(s)).  No results found.   Pertinent items are noted in HPI.  Height 5\' 8"  (1.727 m), weight 97.07 kg (214 lb).  General appearance: alert, cooperative and appears stated age Head: Normocephalic, without obvious abnormality Neck: no JVD Resp: clear to auscultation bilaterally Cardio: regular rate and rhythm, S1, S2 normal, no murmur, click, rub or gallop GI: soft, non-tender; bowel sounds normal; no masses,  no organomegaly Extremities: catching right middle finger Pulses: 2+ and symmetric Skin: Skin color, texture, turgor normal. No rashes or lesions Neurologic: Grossly normal Incision/Wound: na  Assessment/Plan X-rays are negative.   DIAGNOSIS: STS right middle finger. He would like to have this released. Pre, peri and post op care are discussed along with risks and complications. Patient is aware there is no guarantee with surgery, possibility of infection, injury to arteries, nerves, and tendons, incomplete relief and dystrophy.  Sherly Brodbeck R 01/08/2015, 5:20 AM

## 2015-01-09 ENCOUNTER — Encounter (HOSPITAL_BASED_OUTPATIENT_CLINIC_OR_DEPARTMENT_OTHER): Payer: Self-pay | Admitting: Orthopedic Surgery

## 2015-01-09 NOTE — Op Note (Signed)
NAMEGAELEN, BRAGER NO.:  [id number]  MEDICAL RECORD NO.:  [id number]  LOCATION:                                 FACILITY:  PHYSICIAN:  Daryll Brod, M.D.            DATE OF BIRTH:  DATE OF PROCEDURE:  01/08/2015 DATE OF DISCHARGE:                              OPERATIVE REPORT   PREOPERATIVE DIAGNOSIS:  Stenosing tenosynovitis, right middle finger.  POSTOPERATIVE DIAGNOSIS:  Stenosing tenosynovitis, right middle finger.  OPERATION:  Release A1 pulley, right middle finger.  SURGEON:  Daryll Brod, MD  ANESTHESIA:  Forearm-based IV regional local infiltration.  ANESTHESIOLOGIST:  Lorrene Reid, MD  HISTORY:  The patient is a 59 year old male with a history of triggering of his right middle finger, this has not resolved with injections.  He has elected to undergo release of the A1 pulley.  Pre, peri, and postoperative course have been discussed along with risks and complications.  He is aware that there is no guarantee with the surgery; possibility of infection; recurrence of injury to arteries, nerves, tendons; incomplete relief of symptoms; dystrophy.  In preoperative area, the patient was seen, the extremity marked by both patient and surgeon.  Antibiotic given.  PROCEDURE IN DETAIL:  The patient was brought to the operating room, where a forearm-based IV regional anesthetic was carried out without difficulty.  He was prepped using ChloraPrep, supine position with the right arm free.  A 3-minute dry time was allowed.  Time-out taken, confirming patient and procedure.  Oblique incision was made over the A1 pulley, after local infiltration with 0.25% bupivacaine without epinephrine, approximately 6 mL was used.  The A1 pulley was identified. Retractors placed protecting neurovascular bundles radially and ulnarly. An incision was then made on the radial aspect of the A1 pulley.  A small incision was made centrally in A2 with partial tenosynovectomy performed  proximally with separation of the 2 tendons.  Passive mobility, flexion, extension showed no further triggering.  The wound was copiously irrigated with saline and the skin closed with interrupted 4-0 nylon sutures.  A compressive dressing with the fingers free was applied.  On deflation of the tourniquet, all fingers immediately pinked.  He was taken to the recovery room for observation in satisfactory condition.  He will be discharged home to return to [place] in 1 week on Norco.         ______________________________ Daryll Brod, M.D.    GK/MEDQ  D:  01/08/2015  T:  01/09/2015  Job:  [id number]

## 2015-02-15 DIAGNOSIS — M6530 Trigger finger, unspecified finger: Secondary | ICD-10-CM | POA: Insufficient documentation

## 2015-04-11 ENCOUNTER — Other Ambulatory Visit: Payer: Self-pay | Admitting: Cardiology

## 2015-04-11 ENCOUNTER — Other Ambulatory Visit: Payer: Self-pay

## 2015-04-11 MED ORDER — ATORVASTATIN CALCIUM 10 MG PO TABS: 10.0000 mg | ORAL_TABLET | Freq: Every day | ORAL | Status: DC

## 2015-04-11 NOTE — Telephone Encounter (Signed)
Labs from 04/13/2014 says decrease lipitor to 10 mg per dr Mcdowell,escribed to [employer]

## 2015-04-17 ENCOUNTER — Ambulatory Visit (INDEPENDENT_AMBULATORY_CARE_PROVIDER_SITE_OTHER): Payer: BC Managed Care – PPO | Admitting: Cardiology

## 2015-04-17 ENCOUNTER — Encounter: Payer: Self-pay | Admitting: Cardiology

## 2015-04-17 VITALS — BP 140/88 | HR 94 | Ht 69.0 in | Wt 214.0 lb

## 2015-04-17 DIAGNOSIS — I10 Essential (primary) hypertension: Secondary | ICD-10-CM

## 2015-04-17 DIAGNOSIS — I25810 Atherosclerosis of coronary artery bypass graft(s) without angina pectoris: Secondary | ICD-10-CM | POA: Diagnosis not present

## 2015-04-17 DIAGNOSIS — E782 Mixed hyperlipidemia: Secondary | ICD-10-CM

## 2015-04-17 MED ORDER — NITROGLYCERIN 0.4 MG SL SUBL: 0.4000 mg | SUBLINGUAL_TABLET | SUBLINGUAL | Status: DC | PRN

## 2015-04-17 NOTE — Patient Instructions (Signed)
Medication Instructions:  I REFILLED YOUR NITROGLYCERIN   Labwork: I AM GOING TO REQUEST A COPY OF YOUR LAST LABS FROM YOUR PCP  Testing/Procedures: NONE  Follow-Up: Your physician wants you to follow-up in: [place].  You will receive a reminder letter in the mail two months in advance. If you don't receive a letter, please call our office to schedule the follow-up appointment.   Any Other Special Instructions Will Be Listed Below (If Applicable).     If you need a refill on your cardiac medications before your next appointment, please call your pharmacy.

## 2015-04-17 NOTE — Progress Notes (Signed)
Cardiology Office Note  Date: 04/17/2015   ID: Leeshawn, Shayne 07-Jun-1955, MRN [id number]  PCP: Alonza Bogus, MD  Primary Cardiologist: Rozann Lesches, MD   Chief Complaint  Patient presents with  . Coronary Artery Disease    History of Present Illness: [name] [name] is a 60 y.o. male last seen in January 2016. He presents for a routine follow-up visit. Over the last year he reports no recurring angina symptoms on medical therapy. Other health concerns include diagnosis of fatty liver, transient nephrolithiasis, prostatic enlargement, and also difficulty controlling his type 2 diabetes mellitus.  We discussed his medications which are outlined below. Cardiac regimen includes aspirin, Coreg, Lipitor, lisinopril, Effient, and as needed nitroglycerin. We provided him with a refill for a fresh bottle today.  Follow-up cardiac testing done last year was low risk showing a small region of ischemia in the inferolateral wall with LVEF 62%.  He states that he is semiretired at this point. 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  Past Medical History  Diagnosis Date  . Coronary atherosclerosis of native coronary artery     DES distal circumflex 2005, DES LAD/diagonal bifurcation 7/12  . Type 2 diabetes mellitus ([place])   . Essential hypertension, benign   . Mixed hyperlipidemia   . Myocardial infarction ([place])     Anterolateral with VF arrest 7/12  . Diabetes mellitus   . Anxiety     Current Outpatient Prescriptions  Medication Sig Dispense Refill  . aspirin 81 MG tablet Take 81 mg by mouth daily.      Marland Kitchen atorvastatin (LIPITOR) 10 MG tablet Take 1 tablet (10 mg total) by mouth daily. 30 tablet 6  . carvedilol (COREG) 12.5 MG tablet TAKE (1) TABLET TWICE DAILY. 60 tablet 3  . CINNAMON PO Take 1 tablet by mouth 2 (two) times daily.    Marland Kitchen CRANBERRY PO Take 1 tablet by mouth 2 (two) times daily.    Marland Kitchen FORA LANCETS MISC     . FORA V30A BLOOD GLUCOSE TEST test strip     .  HYDROcodone-acetaminophen (NORCO) 5-325 MG tablet Take 1 tablet by mouth every 6 (six) hours as needed for moderate pain. 30 tablet 0  . ibuprofen (ADVIL,MOTRIN) 200 MG tablet Take 200 mg by mouth every 6 (six) hours as needed.    . insulin glargine (LANTUS) 100 UNIT/ML injection Inject 25 Units into the skin at bedtime.     . Liraglutide (VICTOZA) 18 MG/3ML SOLN Inject 1.8 mg into the skin daily.      Marland Kitchen lisinopril (PRINIVIL,ZESTRIL) 20 MG tablet TAKE (1) TABLET TWICE DAILY. 60 tablet 6  . loratadine (CLARITIN) 10 MG tablet Take 10 mg by mouth daily.    . metFORMIN (GLUCOPHAGE) 500 MG tablet Take 1,000 mg by mouth 2 (two) times daily with a meal.     . nitroGLYCERIN (NITROSTAT) 0.4 MG SL tablet Place 1 tablet (0.4 mg total) under the tongue every 5 (five) minutes as needed. 25 tablet 3  . prasugrel (EFFIENT) 10 MG TABS tablet TAKE 1 TABLET BY MOUTH ONCE DAILY. PATIENT NEEDS TO BE SEEN. 30 tablet 1  . QC PEN NEEDLES 31G X 8 MM MISC     . zolpidem (AMBIEN) 10 MG tablet Take 10 mg by mouth at bedtime as needed for sleep.      No current facility-administered medications for this visit.   Allergies:  Review of patient's allergies indicates no known allergies.   Social History: The patient  reports  that he has never smoked. He has never used smokeless tobacco. He reports that he does not drink alcohol or use illicit drugs.   ROS:  Please see the history of present illness. Otherwise, complete review of systems is positive for NYHA class 1-2 dyspnea.  All other systems are reviewed and negative.   Physical Exam: VS:  BP 140/88 mmHg  Pulse 94  Ht 5\' 9"  (1.753 m)  Wt 214 lb (97.07 kg)  BMI 31.59 kg/m2  SpO2 97%, BMI Body mass index is 31.59 kg/(m^2).  Wt Readings from Last 3 Encounters:  04/17/15 214 lb (97.07 kg)  01/08/15 212 lb (96.163 kg)  06/08/14 215 lb (97.523 kg)    Overweight male in no acute distress.  HEENT: Conjunctiva and lids normal, oropharynx with moist mucosa.  Neck:  Supple, no elevated JVP or carotid bruits.  Lungs: Clear to auscultation, nonlabored.  Cardiac: Regular rate and rhythm, no S3 gallop.  Abdomen: Soft, nontender, bowel sounds present, no bruits.  Skin: Warm and dry.  Extremities: No pitting edema, distal pulses 1-2+.   ECG: ECG is not ordered today.  Recent Labwork: 06/08/2014: ALT 45; AST 33; Platelets 226 01/08/2015: BUN 23*; Creatinine, Ser 0.80; Hemoglobin 16.3; Potassium 4.4; Sodium 138     Component Value Date/Time   CHOL 86 04/11/2014 0709   TRIG 146 04/11/2014 0709   HDL 22* 04/11/2014 0709   CHOLHDL 3.9 04/11/2014 0709   VLDL 29 04/11/2014 0709   LDLCALC 35 04/11/2014 0709    Other Studies Reviewed Today:  [facility] Cardiolite 04/17/2014: FINDINGS: Baseline ECG shows normal sinus rhythm at 83 beats per min. Lexiscan bolus was given in standard fashion. Heart rate increased from 84 beats per min up to 108 beats per min, and blood pressure increased from 132/91 up to 159/80. No chest pain was reported. No diagnostic ST segment changes were noted. Rare PVCs were noted.  Analysis of the raw perfusion data finds adequate radiotracer uptake within the myocardium.  Perfusion: There is a small, mild intensity, apical to basal, inferolateral defect noted that exhibits partial reversibility and is consistent with a region of mild ischemia. Summed stress score is 4.  Wall Motion: Normal left ventricular wall motion. No left ventricular dilation.  Left Ventricular Ejection Fraction: 62 %  End diastolic volume 75 ml  End systolic volume 29 ml  IMPRESSION: 1. Small area of ischemia noted within the inferolateral wall.  2. Normal left ventricular wall motion.  3. Left ventricular ejection fraction 62%  4. Low-risk stress test findings*.  Assessment and Plan:  1. Symptomatically stable CAD status post DES to the circumflex in 2005 with subsequent DES to the LAD/diagonal bifurcation in 2012. He  continues on DAPT and medical therapy as outlined above. Follow-up ischemic testing from last year was low risk. I encouraged regular exercise plan and weight loss.  2. Hyperlipidemia, continues on statin therapy. He had recent lab work with Dr. Luan Pulling which will be requested for review.  3. Essential hypertension, no changes made to current regimen.  Current medicines were reviewed with the patient today.  Disposition: FU with me in 1 year.   Signed, Satira Sark, MD, [facility] 04/17/2015 5:14 PM    [facility] at [facility] [address], [place], [place] [address] Phone: [phone]; Fax: [phone]

## 2015-04-18 ENCOUNTER — Encounter: Payer: Self-pay | Admitting: *Deleted

## 2015-05-11 ENCOUNTER — Other Ambulatory Visit: Payer: Self-pay | Admitting: Cardiology

## 2015-08-09 ENCOUNTER — Other Ambulatory Visit: Payer: Self-pay | Admitting: Cardiology

## 2015-10-29 ENCOUNTER — Other Ambulatory Visit (HOSPITAL_COMMUNITY): Payer: Self-pay | Admitting: Pulmonary Disease

## 2015-10-29 DIAGNOSIS — R110 Nausea: Secondary | ICD-10-CM

## 2015-10-31 ENCOUNTER — Ambulatory Visit (HOSPITAL_COMMUNITY)
Admission: RE | Admit: 2015-10-31 | Discharge: 2015-10-31 | Disposition: A | Discharge status: Home / Self Care | Payer: BC Managed Care – PPO | Source: Ambulatory Visit | Attending: Pulmonary Disease | Admitting: Pulmonary Disease

## 2015-10-31 DIAGNOSIS — K769 Liver disease, unspecified: Secondary | ICD-10-CM | POA: Diagnosis not present

## 2015-10-31 DIAGNOSIS — R110 Nausea: Secondary | ICD-10-CM

## 2015-10-31 DIAGNOSIS — K8020 Calculus of gallbladder without cholecystitis without obstruction: Secondary | ICD-10-CM | POA: Insufficient documentation

## 2015-11-06 ENCOUNTER — Other Ambulatory Visit: Payer: Self-pay | Admitting: Cardiology

## 2015-11-26 NOTE — H&P (Signed)
  NTS SOAP Note  Vital Signs:  Vitals as of: 123456: Systolic 99991111: Diastolic 90: Heart Rate 87: Temp 63F (Temporal): Height 76ft 9in: Weight 225Lbs 0 Ounces: BMI 33.23   BMI : 33.23 kg/m2  Subjective: This 60 year old male presents forSymptoms of PROBLEM gallstones.  Has been having right upper quadrant abdominal pain, bloating, upset stomach, and food intolerance intermittently over the past few months.  Seems to be sporadic in nature, resolves on own without medicatiion.  Pain radiates around the right side to flank.  Lasts a few hours.  No fever, chills, jaundice.  Was last seen by Cardiology earlier this year, passed his stress test.   *  Review of Symptoms:  Constitutional:UnremarkablenegativeROSNEGGENROSGener... Head:UnremarkablenegativeHead normal Eyes:UnremarkablenegativeROSNEGEYESROSEyes... Nose/Mouth/Throat:UnremarkablenegativeROSNEGNMTROSENMT... Cardiovascular:UnremarkablenegativeROSNEGCARDIOROSCVS... Respiratory:UnremarkablenegativeROSNEGRESPROSResp... Gastrointestinal:UnremarkablenegativeROSNEGGIROSGI...abdominal pain, nausea, heartburn, dyspepsia Genitourinary:UnremarkablenegativeROSNEGURINEROSGU... Musculoskeletal:UnremarkablenegativeROSNEGMSKROSMusc... Skin:UnremarkablenegativeROSNEGSKINROSSkinBr... Breast:UnremarkablenegativeROSNEGBREAST Hematolgic/Lymphatic:UnremarkablenegativeROSNEGHEMELYMPHROSHemeLy... Allergic/Immunologic:UnremarkablenegativeROSNEGALLERGICROSAllImm...   Past Medical History:ObtainedReviewed  Past Medical History  Surgical History: CABG, right inguinal herniorrhaphy, right hand surgery Medical Problems: CAD, HTN, high cholesterol, dm Allergies: nkda Medications: ambien, glucophage, insulin, prinivil, lipitor, atorvastatin, baby asa, metformin, norco, effient   Social History:ObtainedReviewed  Social History  Preferred Language: English Race:  White Ethnicity: Not  Hispanic / Latino Age: 61 year Marital Status:  M Alcohol: no   Smoking Status: Never smoker reviewed on 11/26/2015 Functional Status reviewed on 11/26/2015 ------------------------------------------------ Bathing: Normal Cooking: Normal Dressing: Normal Driving: Normal Eating: Normal Managing Meds: Normal Oral Care: Normal Shopping: Normal Toileting: Normal Transferring: Normal Walking: Normal Cognitive Status reviewed on 11/26/2015 ------------------------------------------------ Attention: Normal Decision Making: Normal Language: Normal Memory: Normal Motor: Normal Perception: Normal Problem Solving: Normal Visual and Spatial: Normal   Family [place] History Mother, Living; Hypertension (high blood pressure);  Father, Living; Diabetes mellitus, unspecified type;     Objective Information: General:UnremarkableWell appearing, well nourished in no distress.illGeneral Complex Abnormalities Skin:UnremarkableSkin NormalSkin Complex Abnormalities Head:UnremarkableAtraumatic; no masses; no abnormalitiesHead Complex Abnormalities Eyes:UnremarkableEyes NormalEyes Complex Abnormano scleral icterus Mouth:UnremarkableMouth NormalMouth Complex Abnormalities Throat:UnremarkableThroat NormalThroat Complex Abnormalities Neck:UnremarkableSupple without lymphadenopathy. Thyroid Exam NLNeck Complex Abnorma Heart:UnremarkableHeartBriefRRR, no murmur or gallop.  Normal S1, S2.  No S3, S4. Heart Complex Abnorm Lungs:UnremarkableCTA bilaterally, no wheezes, rhonchi, rales.  Breathing unlabored.Lungs Complex Abnorm Breasts:UnremarkableBreastsTannerGirlsBreastBreastsCA Abdomen:UnremarkableAbdomenBriefSoft, NT/ND, normal bowel sounds, no HSM, no masses.  No peritoneal signs.Abdomen Complex Abno Back:UnremarkableBack NormalBack Complex Abnorma GU:UnremarkableGUMaleTannerBoysGUGUMale Complex  AbnorGUFemTannerGirlsGUGUFemCA Rectal:UnremarkableRectalMaleRectalMale Complex ARectalFemRectalFemCA Extremities:UnremarkableExtremities NormalExtremit Complex Abn Musculoskeletal:UnremarkableMusculoskeMusculoCA Lymphatics:UnremarkableLymphBriefLymphaticsLymph Complex Abnormalities Dr. Luan Pulling' notes reviewed.  U/S note reviewed. Assessment:Biliary colic, cholelithiasis  Diagnoses: 574.20  K80.20 Cholelithiasis without obstruction (Calculus of gallbladder without cholecystitis without obstruction)  Procedures: [id number] - OFFICE OUTPATIENT NEW 30 MINUTES    Plan:  Scheduled for laparoscopic cholecystectomy on 12/04/15.  To stop effient today.   Patient Education:Alternative treatments to surgery were discussed with patient (and family).Risks and benefits  of procedure including bleeding, infection, hepatobiliary injury, and the possibility of an open procedure were fully explained to the patient (and family) who gave informed consent. Patient/family questions were addressed.  Follow-up:Pending Surgery

## 2015-11-28 NOTE — Patient Instructions (Signed)
[name] [name]  11/28/2015     @PREFPERIOPPHARMACY @   Your procedure is scheduled on  12/04/2015   Report to [facility] at  725  A.M.  Call this number if you have problems the morning of surgery:  [phone]   Remember:  Do not eat food or drink liquids after midnight.  Take these medicines the morning of surgery with A SIP OF WATER  Coreg, hydrocodone, lisinopril, claritin, prasugrel. Take 1/2 of your usual insulin dosage the night before your surgery. DO NOT take any medicine for diabetes the morning of surgery.   Do not wear jewelry, make-up or nail polish.  Do not wear lotions, powders, or perfumes, or deoderant.  Do not shave 48 hours prior to surgery.  Men may shave face and neck.  Do not bring valuables to the hospital.  [facility] is not responsible for any belongings or valuables.  Contacts, dentures or bridgework may not be worn into surgery.  Leave your suitcase in the car.  After surgery it may be brought to your room.  For patients admitted to the hospital, discharge time will be determined by your treatment team.  Patients discharged the day of surgery will not be allowed to drive home.   Name and phone number of your driver:   family Special instructions:  none  Please read over the following fact sheets that you were given. Anesthesia Post-op Instructions and Care and Recovery After Surgery       Laparoscopic Cholecystectomy Laparoscopic cholecystectomy is surgery to remove the gallbladder. The gallbladder is located in the upper right part of the abdomen, behind the liver. It is a storage sac for bile, which is produced in the liver. Bile aids in the digestion and absorption of fats. Cholecystectomy is often done for inflammation of the gallbladder (cholecystitis). This condition is usually caused by a buildup of gallstones (cholelithiasis) in the gallbladder. Gallstones can block the flow of bile, and that can result in inflammation and  pain. In severe cases, emergency surgery may be required. If emergency surgery is not required, you will have time to prepare for the procedure. Laparoscopic surgery is an alternative to open surgery. Laparoscopic surgery has a shorter recovery time. Your common bile duct may also need to be examined during the procedure. If stones are found in the common bile duct, they may be removed. LET [facility] CARE PROVIDER KNOW ABOUT:  Any allergies you have.  All medicines you are taking, including vitamins, herbs, eye drops, creams, and over-the-counter medicines.  Previous problems you or members of your family have had with the use of anesthetics.  Any blood disorders you have.  Previous surgeries you have had.  Any medical conditions you have. RISKS AND COMPLICATIONS Generally, this is a safe procedure. However, problems may occur, including:  Infection.  Bleeding.  Allergic reactions to medicines.  Damage to other structures or organs.  A stone remaining in the common bile duct.  A bile leak from the cyst duct that is clipped when your gallbladder is removed.  The need to convert to open surgery, which requires a larger incision in the abdomen. This may be necessary if your surgeon thinks that it is not safe to continue with a laparoscopic procedure. BEFORE THE PROCEDURE  Ask your health care provider about:  Changing or stopping your regular medicines. This is especially important if you are taking diabetes medicines or blood thinners.  Taking medicines such as aspirin and ibuprofen. These medicines can thin your blood. Do not take these medicines before your procedure if your health care provider instructs you not to.  Follow instructions from your health care provider about eating or drinking restrictions.  Let your health care provider know if you develop a cold or an infection before surgery.  Plan to have someone take you home after the procedure.  Ask your health  care provider how your surgical site will be marked or identified.  You may be given antibiotic medicine to help prevent infection. PROCEDURE  To reduce your risk of infection:  Your health care team will wash or sanitize their hands.  Your skin will be washed with soap.  An IV tube may be inserted into one of your veins.  You will be given a medicine to make you fall asleep (general anesthetic).  A breathing tube will be placed in your mouth.  The surgeon will make several small cuts (incisions) in your abdomen.  A thin, lighted tube (laparoscope) that has a tiny camera on the end will be inserted through one of the small incisions. The camera on the laparoscope will send a picture to a TV screen (monitor) in the operating room. This will give the surgeon a good view inside your abdomen.  A gas will be pumped into your abdomen. This will expand your abdomen to give the surgeon more room to perform the surgery.  Other tools that are needed for the procedure will be inserted through the other incisions. The gallbladder will be removed through one of the incisions.  After your gallbladder has been removed, the incisions will be closed with stitches (sutures), staples, or skin glue.  Your incisions may be covered with a bandage (dressing). The procedure may vary among health care providers and hospitals. AFTER THE PROCEDURE  Your blood pressure, heart rate, breathing rate, and blood oxygen level will be monitored often until the medicines you were given have worn off.  You will be given medicines as needed to control your pain.   This information is not intended to replace advice given to you by your health care provider. Make sure you discuss any questions you have with your health care provider.   Document Released: 03/16/2005 Document Revised: 12/05/2014 Document Reviewed: 10/26/2012 Elsevier Interactive Patient Education 2016 [employer]  Laparoscopic Cholecystectomy, Care  After Refer to this sheet in the next few weeks. These instructions provide you with information about caring for yourself after your procedure. Your health care provider may also give you more specific instructions. Your treatment has been planned according to current medical practices, but problems sometimes occur. Call your health care provider if you have any problems or questions after your procedure. WHAT TO EXPECT AFTER THE PROCEDURE After your procedure, it is common to have:  Pain at your incision sites. You will be given pain medicines to control your pain.  Mild nausea or vomiting. This should improve after the first 24 hours.  Bloating and possible shoulder pain from the gas that was used during the procedure. This will improve after the first 24 hours. HOME CARE INSTRUCTIONS Incision Care  Follow instructions from your health care provider about how to take care of your incisions. Make sure you:  Wash your hands with soap and water before you change your bandage (dressing). If soap and water are not available, use hand sanitizer.  Change your dressing as told by your health care provider.  Leave stitches (sutures),  skin glue, or adhesive strips in place. These skin closures may need to be in place for 2 weeks or longer. If adhesive strip edges start to loosen and curl up, you may trim the loose edges. Do not remove adhesive strips completely unless your health care provider tells you to do that.  Do not take baths, swim, or use a hot tub until your health care provider approves. Ask your health care provider if you can take showers. You may only be allowed to take sponge baths for bathing. General Instructions  Take over-the-counter and prescription medicines only as told by your health care provider.  Do not drive or operate heavy machinery while taking prescription pain medicine.  Return to your normal diet as told by your health care provider.  Do not lift anything that  is heavier than 10 lb (4.5 kg).  Do not play contact sports for one week or until your health care provider approves. SEEK MEDICAL CARE IF:   You have redness, swelling, or pain at the site of your incision.  You have fluid, blood, or pus coming from your incision.  You notice a bad smell coming from your incision area.  Your surgical incisions break open.  You have a fever. SEEK IMMEDIATE MEDICAL CARE IF:  You develop a rash.  You have difficulty breathing.  You have chest pain.  You have increasing pain in your shoulders (shoulder strap areas).  You faint or have dizzy episodes while you are standing.  You have severe pain in your abdomen.  You have nausea or vomiting that lasts for more than one day.   This information is not intended to replace advice given to you by your health care provider. Make sure you discuss any questions you have with your health care provider.   Document Released: 03/16/2005 Document Revised: 12/05/2014 Document Reviewed: 10/26/2012 Elsevier Interactive Patient Education 2016 [place] Anesthesia, Adult General anesthesia is a sleep-like state of non-feeling produced by medicines (anesthetics). General anesthesia prevents you from being alert and feeling pain during a medical procedure. Your caregiver may recommend general anesthesia if your procedure:  Is long.  Is painful or uncomfortable.  Would be frightening to see or hear.  Requires you to be still.  Affects your breathing.  Causes significant blood loss. LET YOUR CAREGIVER KNOW ABOUT:  Allergies to food or medicine.  Medicines taken, including vitamins, herbs, eyedrops, over-the-counter medicines, and creams.  Use of steroids (by mouth or creams).  Previous problems with anesthetics or numbing medicines, including problems experienced by relatives.  History of bleeding problems or blood clots.  Previous surgeries and types of anesthetics  received.  Possibility of pregnancy, if this applies.  Use of cigarettes, alcohol, or illegal drugs.  Any health condition(s), especially diabetes, sleep apnea, and high blood pressure. RISKS AND COMPLICATIONS General anesthesia rarely causes complications. However, if complications do occur, they can be life threatening. Complications include:  A lung infection.  A stroke.  A heart attack.  Waking up during the procedure. When this occurs, the patient may be unable to move and communicate that he or she is awake. The patient may feel severe pain. Older adults and adults with serious medical problems are more likely to have complications than adults who are young and healthy. Some complications can be prevented by answering all of your caregiver's questions thoroughly and by following all pre-procedure instructions. It is important to tell your caregiver if any of the pre-procedure instructions, especially those related to  diet, were not followed. Any food or liquid in the stomach can cause problems when you are under general anesthesia. BEFORE THE PROCEDURE  Ask your caregiver if you will have to spend the night at the hospital. If you will not have to spend the night, arrange to have an adult drive you and stay with you for 24 hours.  Follow your caregiver's instructions if you are taking dietary supplements or medicines. Your caregiver may tell you to stop taking them or to reduce your dosage.  Do not smoke for as long as possible before your procedure. If possible, stop smoking 3-6 weeks before the procedure.  Do not take new dietary supplements or medicines within 1 week of your procedure unless your caregiver approves them.  Do not eat within 8 hours of your procedure or as directed by your caregiver. Drink only clear liquids, such as water, black coffee (without milk or cream), and fruit juices (without pulp).  Do not drink within 3 hours of your procedure or as directed by your  caregiver.  You may brush your teeth on the morning of the procedure, but make sure to spit out the toothpaste and water when finished. PROCEDURE  You will receive anesthetics through a mask, through an intravenous (IV) access tube, or through both. A doctor who specializes in anesthesia (anesthesiologist) or a nurse who specializes in anesthesia (nurse anesthetist) or both will stay with you throughout the procedure to make sure you remain unconscious. He or she will also watch your blood pressure, pulse, and oxygen levels to make sure that the anesthetics do not cause any problems. Once you are asleep, a breathing tube or mask may be used to help you breathe. AFTER THE PROCEDURE You will wake up after the procedure is complete. You may be in the room where the procedure was performed or in a recovery area. You may have a sore throat if a breathing tube was used. You may also feel:  Dizzy.  Weak.  Drowsy.  Confused.  Nauseous.  Cold. These are all normal responses and can be expected to last for up to 24 hours after the procedure is complete. A caregiver will tell you when you are ready to go home. This will usually be when you are fully awake and in stable condition.   This information is not intended to replace advice given to you by your health care provider. Make sure you discuss any questions you have with your health care provider.   Document Released: 06/23/2007 Document Revised: 04/06/2014 Document Reviewed: 07/15/2011 Elsevier Interactive Patient Education 2016 [place] Anesthesia, Adult, Care After Refer to this sheet in the next few weeks. These instructions provide you with information on caring for yourself after your procedure. Your health care provider may also give you more specific instructions. Your treatment has been planned according to current medical practices, but problems sometimes occur. Call your health care provider if you have any problems or  questions after your procedure. WHAT TO EXPECT AFTER THE PROCEDURE After the procedure, it is typical to experience:  Sleepiness.  Nausea and vomiting. HOME CARE INSTRUCTIONS  For the first 24 hours after general anesthesia:  Have a responsible person with you.  Do not drive a car. If you are alone, do not take public transportation.  Do not drink alcohol.  Do not take medicine that has not been prescribed by your health care provider.  Do not sign important papers or make important decisions.  You may resume  a normal diet and activities as directed by your health care provider.  Change bandages (dressings) as directed.  If you have questions or problems that seem related to general anesthesia, call the hospital and ask for the anesthetist or anesthesiologist on call. SEEK MEDICAL CARE IF:  You have nausea and vomiting that continue the day after anesthesia.  You develop a rash. SEEK IMMEDIATE MEDICAL CARE IF:   You have difficulty breathing.  You have chest pain.  You have any allergic problems.   This information is not intended to replace advice given to you by your health care provider. Make sure you discuss any questions you have with your health care provider.   Document Released: 06/22/2000 Document Revised: 04/06/2014 Document Reviewed: 07/15/2011 Elsevier Interactive Patient Education 2016 Elsevier Inc[employer] PATIENT INSTRUCTIONS POST-ANESTHESIA  IMMEDIATELY FOLLOWING SURGERY:  Do not drive or operate machinery for the first twenty four hours after surgery.  Do not make any important decisions for twenty four hours after surgery or while taking narcotic pain medications or sedatives.  If you develop intractable nausea and vomiting or a severe headache please notify your doctor immediately.  FOLLOW-UP:  Please make an appointment with your surgeon as instructed. You do not need to follow up with anesthesia unless specifically instructed to do so.  WOUND CARE  INSTRUCTIONS (if applicable):  Keep a dry clean dressing on the anesthesia/puncture wound site if there is drainage.  Once the wound has quit draining you may leave it open to air.  Generally you should leave the bandage intact for twenty four hours unless there is drainage.  If the epidural site drains for more than 36-48 hours please call the anesthesia department.  QUESTIONS?:  Please feel free to call your physician or the hospital operator if you have any questions, and they will be happy to assist you.

## 2015-11-29 ENCOUNTER — Encounter (HOSPITAL_COMMUNITY)
Admission: RE | Admit: 2015-11-29 | Discharge: 2015-11-29 | Disposition: A | Discharge status: Home / Self Care | Payer: BC Managed Care – PPO | Source: Ambulatory Visit | Attending: General Surgery | Admitting: General Surgery

## 2015-11-29 ENCOUNTER — Other Ambulatory Visit: Payer: Self-pay

## 2015-11-29 ENCOUNTER — Telehealth: Payer: Self-pay | Admitting: Cardiology

## 2015-11-29 ENCOUNTER — Encounter (HOSPITAL_COMMUNITY): Payer: Self-pay

## 2015-11-29 DIAGNOSIS — Z01812 Encounter for preprocedural laboratory examination: Secondary | ICD-10-CM | POA: Diagnosis present

## 2015-11-29 DIAGNOSIS — Z01810 Encounter for preprocedural cardiovascular examination: Secondary | ICD-10-CM | POA: Diagnosis present

## 2015-11-29 LAB — BASIC METABOLIC PANEL
Anion gap: 8 (ref 5–15)
BUN: 21 mg/dL — ABNORMAL HIGH (ref 6–20)
CALCIUM: 9 mg/dL (ref 8.9–10.3)
CO2: 29 mmol/L (ref 22–32)
CREATININE: 0.91 mg/dL (ref 0.61–1.24)
Chloride: 98 mmol/L — ABNORMAL LOW (ref 101–111)
GFR calc Af Amer: >60 mL/min (ref >60)
GFR calc non Af Amer: >60 mL/min (ref >60)
GLUCOSE: 201 mg/dL — AB (ref 65–99)
Potassium: 4.3 mmol/L (ref 3.5–5.1)
Sodium: 135 mmol/L (ref 135–145)

## 2015-11-29 LAB — HEPATIC FUNCTION PANEL
ALK PHOS: 47 U/L (ref 38–126)
ALT: 29 U/L (ref 17–63)
AST: 22 U/L (ref 15–41)
Albumin: 4.3 g/dL (ref 3.5–5.0)
BILIRUBIN DIRECT: 0.1 mg/dL (ref 0.1–0.5)
BILIRUBIN TOTAL: 0.8 mg/dL (ref 0.3–1.2)
Indirect Bilirubin: 0.7 mg/dL (ref 0.3–0.9)
Total Protein: 7.4 g/dL (ref 6.5–8.1)

## 2015-11-29 LAB — CBC WITH DIFFERENTIAL/PLATELET
BASOS PCT: 0 %
Basophils Absolute: 0 10*3/uL (ref 0.0–0.1)
EOS ABS: 0.2 10*3/uL (ref 0.0–0.7)
Eosinophils Relative: 3 %
HEMATOCRIT: 44.5 % (ref 39.0–52.0)
Hemoglobin: 14.7 g/dL (ref 13.0–17.0)
Lymphocytes Relative: 29 %
Lymphs Abs: 1.9 10*3/uL (ref 0.7–4.0)
MCH: 28.5 pg (ref 26.0–34.0)
MCHC: 33 g/dL (ref 30.0–36.0)
MCV: 86.4 fL (ref 78.0–100.0)
MONO ABS: 0.8 10*3/uL (ref 0.1–1.0)
MONOS PCT: 12 %
Neutro Abs: 3.6 10*3/uL (ref 1.7–7.7)
Neutrophils Relative %: 56 %
Platelets: 237 10*3/uL (ref 150–400)
RBC: 5.15 MIL/uL (ref 4.22–5.81)
RDW: 13.6 % (ref 11.5–15.5)
WBC: 6.4 10*3/uL (ref 4.0–10.5)

## 2015-11-29 NOTE — Telephone Encounter (Signed)
This is a very last minute request for preoperative evaluation. Laparoscopic cholecystectomy planned for September 6. I last saw [name] [name] in January at which time he was clinically stable without progressive angina symptoms. He had a low risk Cardiolite study in January 2016. His medical therapy includes aspirin and Effient - generally these would be held about 7 days prior to planned surgery. Unless there has been significant decline in his cardiac status/symptoms, I would not anticipate that we would have to proceed with any further cardiac testing prior to him pursuing laparoscopic cholecystectomy. Perioperative cardiac risk should be overall low-intermediate range. If there are any concerns that his cardiac status has changed, he would need to be seen in the office for further evaluation.

## 2015-11-29 NOTE — Telephone Encounter (Signed)
Will forward to Dr. McDowell 

## 2015-12-04 ENCOUNTER — Encounter (HOSPITAL_COMMUNITY)
Admission: RE | Disposition: A | Discharge status: Home / Self Care | Payer: Self-pay | Source: Ambulatory Visit | Attending: General Surgery

## 2015-12-04 ENCOUNTER — Ambulatory Visit (HOSPITAL_COMMUNITY)
Admission: RE | Admit: 2015-12-04 | Discharge: 2015-12-04 | Disposition: A | Discharge status: Home / Self Care | Payer: BC Managed Care – PPO | Source: Ambulatory Visit | Attending: General Surgery | Admitting: General Surgery

## 2015-12-04 ENCOUNTER — Encounter (HOSPITAL_COMMUNITY): Payer: Self-pay | Admitting: *Deleted

## 2015-12-04 ENCOUNTER — Ambulatory Visit (HOSPITAL_COMMUNITY): Payer: BC Managed Care – PPO | Admitting: Anesthesiology

## 2015-12-04 DIAGNOSIS — I10 Essential (primary) hypertension: Secondary | ICD-10-CM | POA: Diagnosis not present

## 2015-12-04 DIAGNOSIS — I252 Old myocardial infarction: Secondary | ICD-10-CM | POA: Insufficient documentation

## 2015-12-04 DIAGNOSIS — E119 Type 2 diabetes mellitus without complications: Secondary | ICD-10-CM | POA: Insufficient documentation

## 2015-12-04 DIAGNOSIS — Z79899 Other long term (current) drug therapy: Secondary | ICD-10-CM | POA: Insufficient documentation

## 2015-12-04 DIAGNOSIS — Z7982 Long term (current) use of aspirin: Secondary | ICD-10-CM | POA: Insufficient documentation

## 2015-12-04 DIAGNOSIS — Z955 Presence of coronary angioplasty implant and graft: Secondary | ICD-10-CM | POA: Insufficient documentation

## 2015-12-04 DIAGNOSIS — Z951 Presence of aortocoronary bypass graft: Secondary | ICD-10-CM | POA: Insufficient documentation

## 2015-12-04 DIAGNOSIS — Z794 Long term (current) use of insulin: Secondary | ICD-10-CM | POA: Insufficient documentation

## 2015-12-04 DIAGNOSIS — I2510 Atherosclerotic heart disease of native coronary artery without angina pectoris: Secondary | ICD-10-CM | POA: Diagnosis not present

## 2015-12-04 DIAGNOSIS — Z79891 Long term (current) use of opiate analgesic: Secondary | ICD-10-CM | POA: Diagnosis not present

## 2015-12-04 DIAGNOSIS — E7800 Pure hypercholesterolemia, unspecified: Secondary | ICD-10-CM | POA: Diagnosis not present

## 2015-12-04 DIAGNOSIS — K8010 Calculus of gallbladder with chronic cholecystitis without obstruction: Secondary | ICD-10-CM | POA: Diagnosis not present

## 2015-12-04 DIAGNOSIS — R1011 Right upper quadrant pain: Secondary | ICD-10-CM | POA: Diagnosis present

## 2015-12-04 HISTORY — PX: CHOLECYSTECTOMY: SHX55

## 2015-12-04 LAB — GLUCOSE, CAPILLARY
Glucose-Capillary: 173 mg/dL — ABNORMAL HIGH (ref 65–99)
Glucose-Capillary: 167 mg/dL — ABNORMAL HIGH (ref 65–99)

## 2015-12-04 SURGERY — LAPAROSCOPIC CHOLECYSTECTOMY
Anesthesia: General | Site: Abdomen

## 2015-12-04 MED ORDER — CHLORHEXIDINE GLUCONATE CLOTH 2 % EX PADS: 6.0000 | MEDICATED_PAD | Freq: Once | CUTANEOUS | Status: DC

## 2015-12-04 MED ORDER — LIDOCAINE HCL (CARDIAC) 20 MG/ML IV SOLN
INTRAVENOUS | Status: DC | PRN
  Administered 2015-12-04: 50 mg via INTRAVENOUS

## 2015-12-04 MED ORDER — ONDANSETRON HCL 4 MG/2ML IJ SOLN
INTRAMUSCULAR | Status: AC
  Filled 2015-12-04: qty 2

## 2015-12-04 MED ORDER — ONDANSETRON HCL 4 MG/2ML IJ SOLN
INTRAMUSCULAR | Status: DC | PRN
  Administered 2015-12-04: 4 mg via INTRAVENOUS

## 2015-12-04 MED ORDER — MIDAZOLAM HCL 2 MG/2ML IJ SOLN
INTRAMUSCULAR | Status: AC
  Filled 2015-12-04: qty 2

## 2015-12-04 MED ORDER — PROPOFOL 10 MG/ML IV BOLUS
INTRAVENOUS | Status: AC
  Filled 2015-12-04: qty 20

## 2015-12-04 MED ORDER — CIPROFLOXACIN IN D5W 400 MG/200ML IV SOLN
400.0000 mg | INTRAVENOUS | Status: AC
  Administered 2015-12-04: 400 mg via INTRAVENOUS
  Filled 2015-12-04: qty 200

## 2015-12-04 MED ORDER — ONDANSETRON HCL 4 MG/2ML IJ SOLN
4.0000 mg | Freq: Once | INTRAMUSCULAR | Status: AC
  Administered 2015-12-04: 4 mg via INTRAVENOUS

## 2015-12-04 MED ORDER — PROPOFOL 10 MG/ML IV BOLUS
INTRAVENOUS | Status: DC | PRN
  Administered 2015-12-04: 150 mg via INTRAVENOUS

## 2015-12-04 MED ORDER — HYDROCODONE-ACETAMINOPHEN 5-325 MG PO TABS: 1.0000 | ORAL_TABLET | ORAL | 0 refills | Status: DC | PRN

## 2015-12-04 MED ORDER — SODIUM CHLORIDE 0.9 % IR SOLN
Status: DC | PRN
  Administered 2015-12-04: 500 mL

## 2015-12-04 MED ORDER — HEMOSTATIC AGENTS (NO CHARGE) OPTIME
TOPICAL | Status: DC | PRN
  Administered 2015-12-04: 1 via TOPICAL

## 2015-12-04 MED ORDER — KETOROLAC TROMETHAMINE 30 MG/ML IJ SOLN
30.0000 mg | Freq: Once | INTRAMUSCULAR | Status: AC
  Administered 2015-12-04: 30 mg via INTRAVENOUS
  Filled 2015-12-04: qty 1

## 2015-12-04 MED ORDER — FENTANYL CITRATE (PF) 250 MCG/5ML IJ SOLN
INTRAMUSCULAR | Status: AC
  Filled 2015-12-04: qty 5

## 2015-12-04 MED ORDER — NEOSTIGMINE METHYLSULFATE 10 MG/10ML IV SOLN
INTRAVENOUS | Status: DC | PRN
Start: 2015-12-04 — End: 2015-12-04
  Administered 2015-12-04: 4 mg via INTRAVENOUS

## 2015-12-04 MED ORDER — BUPIVACAINE HCL (PF) 0.5 % IJ SOLN
INTRAMUSCULAR | Status: DC | PRN
  Administered 2015-12-04: 10 mL

## 2015-12-04 MED ORDER — PROMETHAZINE HCL 25 MG/ML IJ SOLN
6.2500 mg | INTRAMUSCULAR | Status: AC
  Administered 2015-12-04 (×2): 6.25 mg via INTRAVENOUS

## 2015-12-04 MED ORDER — HYDROMORPHONE HCL 1 MG/ML IJ SOLN: 0.2500 mg | INTRAMUSCULAR | Status: DC | PRN

## 2015-12-04 MED ORDER — FENTANYL CITRATE (PF) 100 MCG/2ML IJ SOLN
INTRAMUSCULAR | Status: DC | PRN
  Administered 2015-12-04: 25 ug via INTRAVENOUS
  Administered 2015-12-04 (×3): 50 ug via INTRAVENOUS

## 2015-12-04 MED ORDER — POVIDONE-IODINE 10 % OINT PACKET
TOPICAL_OINTMENT | CUTANEOUS | Status: DC | PRN
  Administered 2015-12-04: 1 via TOPICAL

## 2015-12-04 MED ORDER — PROMETHAZINE HCL 25 MG/ML IJ SOLN
INTRAMUSCULAR | Status: AC
  Filled 2015-12-04: qty 1

## 2015-12-04 MED ORDER — MIDAZOLAM HCL 2 MG/2ML IJ SOLN
1.0000 mg | INTRAMUSCULAR | Status: DC | PRN
  Administered 2015-12-04: 2 mg via INTRAVENOUS

## 2015-12-04 MED ORDER — PHENYLEPHRINE HCL 10 MG/ML IJ SOLN
INTRAMUSCULAR | Status: DC | PRN
  Administered 2015-12-04 (×2): 40 ug via INTRAVENOUS

## 2015-12-04 MED ORDER — GLYCOPYRROLATE 0.2 MG/ML IJ SOLN
INTRAMUSCULAR | Status: DC | PRN
  Administered 2015-12-04: 0.6 mg via INTRAVENOUS
  Administered 2015-12-04: 0.2 mg via INTRAVENOUS

## 2015-12-04 MED ORDER — LACTATED RINGERS IV SOLN
INTRAVENOUS | Status: DC
  Administered 2015-12-04 (×2): via INTRAVENOUS

## 2015-12-04 MED ORDER — ARTIFICIAL TEARS OP OINT: TOPICAL_OINTMENT | OPHTHALMIC | Status: DC | PRN

## 2015-12-04 MED ORDER — ROCURONIUM BROMIDE 100 MG/10ML IV SOLN
INTRAVENOUS | Status: DC | PRN
  Administered 2015-12-04: 20 mg via INTRAVENOUS

## 2015-12-04 SURGICAL SUPPLY — 43 items
APPLIER CLIP LAPSCP 10X32 DD (CLIP) ×3 IMPLANT
BAG HAMPER (MISCELLANEOUS) ×3 IMPLANT
BAG SPEC RTRVL LRG 6X4 10 (ENDOMECHANICALS) ×1
CHLORAPREP W/TINT 26ML (MISCELLANEOUS) ×3 IMPLANT
CLOTH BEACON ORANGE TIMEOUT ST (SAFETY) ×3 IMPLANT
COVER LIGHT HANDLE STERIS (MISCELLANEOUS) ×6 IMPLANT
DECANTER SPIKE VIAL GLASS SM (MISCELLANEOUS) ×3 IMPLANT
ELECT REM PT RETURN 9FT ADLT (ELECTROSURGICAL) ×3
ELECTRODE REM PT RTRN 9FT ADLT (ELECTROSURGICAL) ×1 IMPLANT
FILTER SMOKE EVAC LAPAROSHD (FILTER) ×3 IMPLANT
FORMALIN 10 PREFIL 120ML (MISCELLANEOUS) ×3 IMPLANT
GLOVE BIOGEL PI IND STRL 7.0 (GLOVE) ×1 IMPLANT
GLOVE BIOGEL PI INDICATOR 7.0 (GLOVE) ×2
GLOVE SURG SS PI 7.5 STRL IVOR (GLOVE) ×3 IMPLANT
GOWN STRL REUS W/ TWL XL LVL3 (GOWN DISPOSABLE) ×1 IMPLANT
GOWN STRL REUS W/TWL LRG LVL3 (GOWN DISPOSABLE) ×6 IMPLANT
GOWN STRL REUS W/TWL XL LVL3 (GOWN DISPOSABLE) ×3
HEMOSTAT SNOW SURGICEL 2X4 (HEMOSTASIS) ×3 IMPLANT
INST SET LAPROSCOPIC AP (KITS) ×3 IMPLANT
IV NS IRRIG 3000ML ARTHROMATIC (IV SOLUTION) IMPLANT
KIT ROOM TURNOVER APOR (KITS) ×3 IMPLANT
MANIFOLD NEPTUNE II (INSTRUMENTS) ×3 IMPLANT
NDL INSUFFLATION 14GA 120MM (NEEDLE) ×1 IMPLANT
NEEDLE INSUFFLATION 14GA 120MM (NEEDLE) ×3 IMPLANT
NS IRRIG 1000ML POUR BTL (IV SOLUTION) ×3 IMPLANT
PACK LAP CHOLE LZT030E (CUSTOM PROCEDURE TRAY) ×3 IMPLANT
PAD ARMBOARD 7.5X6 YLW CONV (MISCELLANEOUS) ×3 IMPLANT
POUCH SPECIMEN RETRIEVAL 10MM (ENDOMECHANICALS) ×3 IMPLANT
SET BASIN LINEN APH (SET/KITS/TRAYS/PACK) ×3 IMPLANT
SET TUBE IRRIG SUCTION NO TIP (IRRIGATION / IRRIGATOR) IMPLANT
SLEEVE ENDOPATH XCEL 5M (ENDOMECHANICALS) ×3 IMPLANT
SPONGE GAUZE 2X2 8PLY STER LF (GAUZE/BANDAGES/DRESSINGS) ×4
SPONGE GAUZE 2X2 8PLY STRL LF (GAUZE/BANDAGES/DRESSINGS) ×8 IMPLANT
STAPLER VISISTAT (STAPLE) ×3 IMPLANT
SUT VICRYL 0 UR6 27IN ABS (SUTURE) ×3 IMPLANT
TAPE CLOTH SURG 4X10 WHT LF (GAUZE/BANDAGES/DRESSINGS) ×2 IMPLANT
TROCAR ENDO BLADELESS 11MM (ENDOMECHANICALS) ×3 IMPLANT
TROCAR XCEL NON-BLD 5MMX100MML (ENDOMECHANICALS) ×3 IMPLANT
TROCAR XCEL UNIV SLVE 11M 100M (ENDOMECHANICALS) ×3 IMPLANT
TUBE CONNECTING 12'X1/4 (SUCTIONS) ×1
TUBE CONNECTING 12X1/4 (SUCTIONS) ×2 IMPLANT
TUBING INSUFFLATION (TUBING) ×3 IMPLANT
WARMER LAPAROSCOPE (MISCELLANEOUS) ×3 IMPLANT

## 2015-12-04 NOTE — Transfer of Care (Signed)
Immediate Anesthesia Transfer of Care Note  Patient: [name] [name]  Procedure(s) Performed: Procedure(s): LAPAROSCOPIC CHOLECYSTECTOMY (N/A)  Patient Location: PACU  Anesthesia Type:General  Level of Consciousness: awake, alert , oriented and patient cooperative  Airway & Oxygen Therapy: Patient Spontanous Breathing and Patient connected to face mask oxygen  Post-op Assessment: Report given to RN and Post -op Vital signs reviewed and stable  Post vital signs: Reviewed and stable  Last Vitals:  Vitals:   12/04/15 0810 12/04/15 0815  BP: 127/81 117/76  Resp: 17 (!) 22  Temp:      Last Pain:  Vitals:   12/04/15 0739  TempSrc: Oral  PainSc: 2       Patients Stated Pain Goal: 7 ([id number])  Complications: No apparent anesthesia complications

## 2015-12-04 NOTE — Op Note (Signed)
Patient:  [name] [name]  DOB:  [birth year]  MRN:  [id number]   Preop Diagnosis:  Cholecystitis, cholelithiasis  Postop Diagnosis:  Same  Procedure:  Laparoscopic cholecystectomy  Surgeon:  Aviva Signs, M.D.  Assistant: Tama High, M.D.  Anes:  Gen. endotracheal  Indications:  Patient is a 60 year old white male who presents with cholecystitis secondary to cholelithiasis. The risks and benefits of the procedure including bleeding, infection, hepatobiliary injury, and the possibility of an open procedure were fully explained to the patient, who gave informed consent.  Procedure note:  The patient was placed the supine position. After induction of general endotracheal anesthesia, the abdomen was prepped and draped using the usual sterile technique with DuraPrep. Surgical site confirmation was performed.  A supraumbilical incision was made down to the fascia. A Veress needle was in the abdominal cavity and confirmation of placement was done using the saline drop test. The abdomen was then insufflated to 16 mmHg pressure. An 11 mm trocar was introduced into the abdominal cavity under direct visualization without difficulty. Patient was placed in reverse Trendelenburg position and additional 11 mm trocar was placed the epigastric region 5 mm trochars were placed the right upper quadrant and right flank regions. Liver was inspected and noted within normal limits. The gallbladder was retracted in a dynamic fashion in order to provide a critical view of the triangle of [place]. The cystic duct was first identified. Its juncture to the infundibulum was fully identified. Endoclips placed proximally and distally on the cystic duct, and the cystic duct was divided. This was likewise done cystic artery. The gallbladder was freed away from the gallbladder fossa using Bovie electrocautery. The gallbladder was delivered through the epigastric trocar site using an Endo Catch bag. The gallbladder fossa was  inspected no abnormal bleeding or bile leakage was noted. Surgicel was placed in the gallbladder fossa. All fluid and air were then evacuated from the abdominal cavity prior to removal of the trochars.  All wounds were irrigated with normal saline. All wounds were injected with 0.5% Sensorcaine. The supraumbilical fascia as well as epigastric fascia were reapproximated using 0 Vicryl interrupted sutures. All skin incisions were closed using staples. Betadine ointment dry sterile dressings were applied.  All tape and needle counts were correct at the end of the procedure. Patient was extubated in the operating room and transferred to PACU in stable condition.  Complications:  None  EBL:  Minimal   Specimen:  gallbladder

## 2015-12-04 NOTE — Interval H&P Note (Signed)
History and Physical Interval Note:  12/04/2015 8:13 AM  [name] [name]  has presented today for surgery, with the diagnosis of cholelithiasis  The various methods of treatment have been discussed with the patient and family. After consideration of risks, benefits and other options for treatment, the patient has consented to  Procedure(s): LAPAROSCOPIC CHOLECYSTECTOMY (N/A) as a surgical intervention .  The patient's history has been reviewed, patient examined, no change in status, stable for surgery.  I have reviewed the patient's chart and labs.  Questions were answered to the patient's satisfaction.     Aviva Signs A

## 2015-12-04 NOTE — Anesthesia Preprocedure Evaluation (Signed)
Anesthesia Evaluation  Patient identified by MRN, date of birth, ID band Patient awake    Reviewed: Allergy & Precautions, NPO status , Patient's Chart, lab work & pertinent test results, reviewed documented beta blocker date and time   Airway Mallampati: II  TM Distance: >3 FB Neck ROM: Full    Dental  (+) Teeth Intact   Pulmonary neg pulmonary ROS,    breath sounds clear to auscultation       Cardiovascular hypertension, Pt. on medications and Pt. on home beta blockers + CAD, + Past MI and + Cardiac Stents   Rhythm:Regular Rate:Normal     Neuro/Psych PSYCHIATRIC DISORDERS Anxiety    GI/Hepatic negative GI ROS, GERD  ,  Endo/Other  diabetes, Type 2, Oral Hypoglycemic Agents  Renal/GU      Musculoskeletal   Abdominal   Peds  Hematology   Anesthesia Other Findings   Reproductive/Obstetrics                             Anesthesia Physical Anesthesia Plan  ASA: III  Anesthesia Plan: General   Post-op Pain Management:    Induction: Intravenous, Rapid sequence and Cricoid pressure planned  Airway Management Planned: Oral ETT  Additional Equipment:   Intra-op Plan:   Post-operative Plan: Extubation in OR  Informed Consent: I have reviewed the patients History and Physical, chart, labs and discussed the procedure including the risks, benefits and alternatives for the proposed anesthesia with the patient or authorized representative who has indicated his/her understanding and acceptance.     Plan Discussed with:   Anesthesia Plan Comments:         Anesthesia Quick Evaluation

## 2015-12-04 NOTE — Discharge Instructions (Signed)
Laparoscopic Cholecystectomy, Care After °Refer to this sheet in the next few weeks. These instructions provide you with information about caring for yourself after your procedure. Your health care provider may also give you more specific instructions. Your treatment has been planned according to current medical practices, but problems sometimes occur. Call your health care provider if you have any problems or questions after your procedure. °WHAT TO EXPECT AFTER THE PROCEDURE °After your procedure, it is common to have: °· Pain at your incision sites. You will be given pain medicines to control your pain. °· Mild nausea or vomiting. This should improve after the first 24 hours. °· Bloating and possible shoulder pain from the gas that was used during the procedure. This will improve after the first 24 hours. °HOME CARE INSTRUCTIONS °Incision Care °· Follow instructions from your health care provider about how to take care of your incisions. Make sure you: °¨ Wash your hands with soap and water before you change your bandage (dressing). If soap and water are not available, use hand sanitizer. °¨ Change your dressing as told by your health care provider. °¨ Leave stitches (sutures), skin glue, or adhesive strips in place. These skin closures may need to be in place for 2 weeks or longer. If adhesive strip edges start to loosen and curl up, you may trim the loose edges. Do not remove adhesive strips completely unless your health care provider tells you to do that. °· Do not take baths, swim, or use a hot tub until your health care provider approves. Ask your health care provider if you can take showers. You may only be allowed to take sponge baths for bathing. °General Instructions °· Take over-the-counter and prescription medicines only as told by your health care provider. °· Do not drive or operate heavy machinery while taking prescription pain medicine. °· Return to your normal diet as told by your health care  provider. °· Do not lift anything that is heavier than 10 lb (4.5 kg). °· Do not play contact sports for one week or until your health care provider approves. °SEEK MEDICAL CARE IF:  °· You have redness, swelling, or pain at the site of your incision. °· You have fluid, blood, or pus coming from your incision. °· You notice a bad smell coming from your incision area. °· Your surgical incisions break open. °· You have a fever. °SEEK IMMEDIATE MEDICAL CARE IF: °· You develop a rash. °· You have difficulty breathing. °· You have chest pain. °· You have increasing pain in your shoulders (shoulder strap areas). °· You faint or have dizzy episodes while you are standing. °· You have severe pain in your abdomen. °· You have nausea or vomiting that lasts for more than one day. °  °This information is not intended to replace advice given to you by your health care provider. Make sure you discuss any questions you have with your health care provider. °  °Document Released: 03/16/2005 Document Revised: 12/05/2014 Document Reviewed: 10/26/2012 °Elsevier Interactive Patient Education ©2016 [employer] ° ° °PATIENT INSTRUCTIONS °POST-ANESTHESIA ° °IMMEDIATELY FOLLOWING SURGERY:  Do not drive or operate machinery for the first twenty four hours after surgery.  Do not make any important decisions for twenty four hours after surgery or while taking narcotic pain medications or sedatives.  If you develop intractable nausea and vomiting or a severe headache please notify your doctor immediately. ° °FOLLOW-UP:  Please make an appointment with your surgeon as instructed. You do not need to   follow up with anesthesia unless specifically instructed to do so. ° °WOUND CARE INSTRUCTIONS (if applicable):  Keep a dry clean dressing on the anesthesia/puncture wound site if there is drainage.  Once the wound has quit draining you may leave it open to air.  Generally you should leave the bandage intact for twenty four hours unless there is  drainage.  If the epidural site drains for more than 36-48 hours please call the anesthesia department. ° °QUESTIONS?:  Please feel free to call your physician or the hospital operator if you have any questions, and they will be happy to assist you.    ° ° ° °

## 2015-12-04 NOTE — Anesthesia Procedure Notes (Signed)
Procedure Name: Intubation Date/Time: 12/04/2015 9:01 AM Performed by: Andree Elk, AMY A Pre-anesthesia Checklist: Timeout performed, Patient identified, Emergency Drugs available and Suction available Patient Re-evaluated:Patient Re-evaluated prior to inductionOxygen Delivery Method: Circle System Utilized Preoxygenation: Pre-oxygenation with 100% oxygen Intubation Type: IV induction Ventilation: Mask ventilation without difficulty Laryngoscope Size: Miller and 3 Grade View: Grade I Tube type: Oral Tube size: 7.0 mm Number of attempts: 1 Airway Equipment and Method: Stylet Placement Confirmation: ETT inserted through vocal cords under direct vision,  positive ETCO2 and breath sounds checked- equal and bilateral Secured at: 21 cm Tube secured with: Tape Dental Injury: Teeth and Oropharynx as per pre-operative assessment

## 2015-12-05 HISTORY — PX: CHOLECYSTECTOMY, LAPAROSCOPIC: SHX56

## 2015-12-10 NOTE — Anesthesia Postprocedure Evaluation (Signed)
Anesthesia Post Note  Patient: POWER ARNET  Procedure(s) Performed: Procedure(s) (LRB): LAPAROSCOPIC CHOLECYSTECTOMY (N/A)  Patient location during evaluation: PACU Anesthesia Type: General Level of consciousness: awake Pain management: satisfactory to patient Vital Signs Assessment: post-procedure vital signs reviewed and stable Respiratory status: spontaneous breathing Cardiovascular status: stable Anesthetic complications: no Comments: LATE ENTRY 12/10/2015 0806  T. Arletta [name] CRNA    Last Vitals:  Vitals:   12/04/15 1045 12/04/15 1109  BP:  (!) 169/97  Pulse: 78 92  Resp: 17 17  Temp:  36.7 C    Last Pain:  Vitals:   12/05/15 0917  TempSrc:   PainSc: 2                  Taequan Stockhausen

## 2015-12-13 ENCOUNTER — Encounter (HOSPITAL_COMMUNITY): Payer: Self-pay | Admitting: General Surgery

## 2016-02-08 ENCOUNTER — Other Ambulatory Visit: Payer: Self-pay | Admitting: Cardiology

## 2016-04-03 ENCOUNTER — Other Ambulatory Visit: Payer: Self-pay | Admitting: Cardiology

## 2016-04-17 ENCOUNTER — Telehealth: Payer: Self-pay | Admitting: Cardiology

## 2016-04-17 MED ORDER — PRASUGREL HCL 10 MG PO TABS: 10.0000 mg | ORAL_TABLET | Freq: Every day | ORAL | 3 refills | Status: AC

## 2016-04-17 NOTE — Telephone Encounter (Signed)
Pt is unable to afford his prasugrel (EFFIENT) 10 MG TABS tablet [id number] it has gone up to $500, pt took his last one today. There is a generic Prasugrel that is $5 please give the pt a call on his cell phone  [phone]

## 2016-04-27 NOTE — Progress Notes (Signed)
Cardiology Office Note  Date: 04/28/2016   ID: [name] [name], [name] [name] Jun 27, 1955, MRN [id number]  PCP: Alonza Bogus, MD  Primary Cardiologist: Rozann Lesches, MD   Chief Complaint  Patient presents with  . Coronary Artery Disease    History of Present Illness: [name] [name] is a 61 y.o. male last seen in January 2017. He presents for a routine follow-up visit. Reports no interval development of angina symptoms. Does have dyspnea on exertion which has been stable, he attributes some of this to weight and also no regular exercise plan. 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  I reviewed his medications which are outlined below. He is now on generic Effient. Reports no bleeding problems. We have maintained long-term DAPT.  He continues to follow with Dr. Luan Pulling, we are requesting his last lipid panel. Lipids have been well controlled on Lipitor.  Last stress test was in 2016 as reviewed below.  Past Medical History:  Diagnosis Date  . Anxiety   . Coronary atherosclerosis of native coronary artery    DES distal circumflex 2005, DES LAD/diagonal bifurcation 7/12  . Diabetes mellitus   . Essential hypertension, benign   . Mixed hyperlipidemia   . Myocardial infarction    Anterolateral with VF arrest 7/12  . Type 2 diabetes mellitus ([place])     Past Surgical History:  Procedure Laterality Date  . CARDIAC CATHETERIZATION  2012  . CAROTID STENT     stents x 2   . CHOLECYSTECTOMY N/A 12/04/2015   Procedure: LAPAROSCOPIC CHOLECYSTECTOMY;  Surgeon: Aviva Signs, MD;  Location: [facility];  Service: General;  Laterality: N/A;  . CHOLECYSTECTOMY, LAPAROSCOPIC  12/05/2015  . CORONARY ANGIOPLASTY  2012  . INGUINAL HERNIA REPAIR     RIGHT GROIN  . TRIGGER FINGER RELEASE Right 01/08/2015   Procedure: RELEASE TRIGGER FINGER/A-1 PULLEY RIGHT MIDDLE FINGER;  Surgeon: Daryll Brod, MD;  Location: [facility];  Service:  Orthopedics;  Laterality: Right;    Current Outpatient Prescriptions  Medication Sig Dispense Refill  . aspirin 81 MG tablet Take 81 mg by mouth daily.      Marland Kitchen atorvastatin (LIPITOR) 10 MG tablet TAKE (1) TABLET BY MOUTH ONCE DAILY. 30 tablet 6  . carvedilol (COREG) 12.5 MG tablet TAKE (1) TABLET TWICE DAILY. 60 tablet 3  . carvedilol (COREG) 12.5 MG tablet TAKE (1) TABLET TWICE DAILY. 60 tablet 11  . CINNAMON PO Take 1 tablet by mouth 2 (two) times daily.    Marland Kitchen CRANBERRY PO Take 1 tablet by mouth 2 (two) times daily.    Marland Kitchen FORA LANCETS MISC     . FORA V30A BLOOD GLUCOSE TEST test strip     . HYDROcodone-acetaminophen (NORCO) 5-325 MG tablet Take 1-2 tablets by mouth every 4 (four) hours as needed for moderate pain. 50 tablet 0  . ibuprofen (ADVIL,MOTRIN) 200 MG tablet Take 200 mg by mouth every 6 (six) hours as needed.    . insulin glargine (LANTUS) 100 UNIT/ML injection Inject 20 Units into the skin every morning. 60 units at bedtime    . Liraglutide (VICTOZA) 18 MG/3ML SOLN Inject 1.8 mg into the skin daily.      Marland Kitchen lisinopril (PRINIVIL,ZESTRIL) 20 MG tablet TAKE (1) TABLET TWICE DAILY. 60 tablet 6  . loratadine (CLARITIN) 10 MG tablet Take 10 mg by mouth daily.    . metFORMIN (GLUCOPHAGE) 500 MG tablet Take 1,000 mg by mouth 2 (two) times daily with  a meal.     . nitroGLYCERIN (NITROSTAT) 0.4 MG SL tablet Place 1 tablet (0.4 mg total) under the tongue every 5 (five) minutes as needed. 25 tablet 3  . prasugrel (EFFIENT) 10 MG TABS tablet Take 1 tablet (10 mg total) by mouth daily. 90 tablet 3  . QC PEN NEEDLES 31G X 8 MM MISC     . zolpidem (AMBIEN) 10 MG tablet Take 10 mg by mouth at bedtime as needed for sleep.      No current facility-administered medications for this visit.    Allergies:  Patient has no allergy information on record.   Social History: The patient  reports that he has never smoked. He has never used smokeless tobacco. He reports that he does not drink alcohol or use  drugs.   ROS:  Please see the history of present illness. Otherwise, complete review of systems is positive for interval laparoscopic cholecystectomy last fall.  All other systems are reviewed and negative.   Physical Exam: VS:  BP 118/74   Pulse 87   Ht 5\' 9"  (1.753 m)   Wt 222 lb (100.7 kg)   SpO2 96%   BMI 32.78 kg/m , BMI Body mass index is 32.78 kg/m.  Wt Readings from Last 3 Encounters:  04/28/16 222 lb (100.7 kg)  11/29/15 222 lb (100.7 kg)  04/17/15 214 lb (97.1 kg)    Overweight male in no acute distress.  HEENT: Conjunctiva and lids normal, oropharynx clear.  Neck: Supple, no elevated JVP or carotid bruits.  Lungs: Clear to auscultation, nonlabored.  Cardiac: Regular rate and rhythm, no S3 gallop.  Abdomen: Soft, nontender, bowel sounds present, no bruits.  Skin: Warm and dry.  Extremities: No pitting edema, distal pulses 1-2+.  Musculoskeletal: No kyphosis. Neuropsychiatric: Alert and oriented 3, affect appropriate.  ECG: I personally reviewed the tracing from 11/29/2015 which showed sinus rhythm with old inferior infarct pattern.  Recent Labwork: 11/29/2015: ALT 29; AST 22; BUN 21; Creatinine, Ser 0.91; Hemoglobin 14.7; Platelets 237; Potassium 4.3; Sodium 135     Component Value Date/Time   CHOL 86 04/11/2014 0709   TRIG 146 04/11/2014 0709   HDL 22 (L) 04/11/2014 0709   CHOLHDL 3.9 04/11/2014 0709   VLDL 29 04/11/2014 0709   LDLCALC 35 04/11/2014 0709    Other Studies Reviewed Today:  [facility] Adam Cardiolite 04/17/2014: FINDINGS: Baseline ECG shows normal sinus rhythm at 83 beats per min. Lexiscan bolus was given in standard fashion. Heart rate increased from 84 beats per min up to 108 beats per min, and blood pressure increased from 132/91 up to 159/80. No chest pain was reported. No diagnostic ST segment changes were noted. Rare PVCs were noted.  Analysis of the raw perfusion data finds adequate radiotracer uptake within the  myocardium.  Perfusion: There is a small, mild intensity, apical to basal, inferolateral defect noted that exhibits partial reversibility and is consistent with a region of mild ischemia. Summed stress score is 4.  Wall Motion: Normal left ventricular wall motion. No left ventricular dilation.  Left Ventricular Ejection Fraction: 62 %  End diastolic volume 75 ml  End systolic volume 29 ml  IMPRESSION: 1. Small area of ischemia noted within the inferolateral wall.  2. Normal left ventricular wall motion.  3. Left ventricular ejection fraction 62%  4. Low-risk stress test findings*.  Assessment and Plan:  1. Symptomatically stable CAD status post DES to the circumflex in 2005 as well as DES to the LAD/diagonal bifurcation in 2012.  Plan to continue medical therapy including current antiplatelet regimen. He underwent low risk stress testing within the last 2 years. Continue with observation for now. I did recommend a walking plan for exercise and weight loss.  2. Hyperlipidemia, continues on Lipitor. Requesting most recent lipid panel from Dr. Luan Pulling.  3. Essential hypertension, blood pressure well controlled today. No changes made to current regimen.  4. Type 2 diabetes mellitus, followed by Dr. Luan Pulling.  Current medicines were reviewed with the patient today.  Disposition: Follow-up in one year, sooner if needed.  Signed, Satira Sark, MD, [facility] 04/28/2016 10:52 AM    [facility]. [address], [place], [place] [address] Phone: [phone]; Fax: [phone]

## 2016-04-28 ENCOUNTER — Encounter: Payer: Self-pay | Admitting: Cardiology

## 2016-04-28 ENCOUNTER — Ambulatory Visit (INDEPENDENT_AMBULATORY_CARE_PROVIDER_SITE_OTHER): Payer: BC Managed Care – PPO | Admitting: Cardiology

## 2016-04-28 VITALS — BP 118/74 | HR 87 | Ht 69.0 in | Wt 222.0 lb

## 2016-04-28 DIAGNOSIS — I2510 Atherosclerotic heart disease of native coronary artery without angina pectoris: Secondary | ICD-10-CM

## 2016-04-28 DIAGNOSIS — E118 Type 2 diabetes mellitus with unspecified complications: Secondary | ICD-10-CM

## 2016-04-28 DIAGNOSIS — I10 Essential (primary) hypertension: Secondary | ICD-10-CM

## 2016-04-28 DIAGNOSIS — E782 Mixed hyperlipidemia: Secondary | ICD-10-CM | POA: Diagnosis not present

## 2016-04-28 NOTE — Patient Instructions (Signed)
Your physician wants you to follow-up in: 1 year with Dr McDowell You will receive a reminder letter in the mail two months in advance. If you don't receive a letter, please call our office to schedule the follow-up appointment.    Your physician recommends that you continue on your current medications as directed. Please refer to the Current Medication list given to you today.     If you need a refill on your cardiac medications before your next appointment, please call your pharmacy.     Thank you for choosing [facility] !        

## 2016-11-03 ENCOUNTER — Other Ambulatory Visit: Payer: Self-pay | Admitting: Cardiology

## 2017-02-04 ENCOUNTER — Other Ambulatory Visit: Payer: Self-pay | Admitting: Cardiology

## 2017-03-06 ENCOUNTER — Other Ambulatory Visit: Payer: Self-pay | Admitting: Cardiology

## 2017-04-20 NOTE — Progress Notes (Signed)
Cardiology Office Note  Date: 04/22/2017   ID: Keygan, Dumond [birth year], MRN [id number]  PCP: Sinda Du, MD  Primary Cardiologist: Rozann Lesches, MD   Chief Complaint  Patient presents with  . Coronary Artery Disease    History of Present Illness: [name] [name] is a 62 y.o. male last seen in January 2018. He presents today for a routine follow-up visit. Since last assessment he does not report any progressive angina symptoms or increasing nitroglycerin use. He states that he has been compliant with his cardiac medications. 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  Myoview from January 2016 was overall low risk as outlined below. I personally reviewed his ECG today which shows sinus rhythm with old anterior infarct pattern and borderline low voltage in the precordial leads.  He had lab work per Dr. Luan Pulling obtained earlier this morning, results pending.  His current cardiac regimen includes aspirin, Effient, Lipitor, Coreg, lisinopril, and as needed nitroglycerin.  Past Medical History:  Diagnosis Date  . Anxiety   . Coronary atherosclerosis of native coronary artery    DES distal circumflex 2005, DES LAD/diagonal bifurcation 7/12  . Diabetes mellitus   . Essential hypertension, benign   . Mixed hyperlipidemia   . Myocardial infarction ([place])    Anterolateral with VF arrest 7/12  . Type 2 diabetes mellitus ([place])     Past Surgical History:  Procedure Laterality Date  . CARDIAC CATHETERIZATION  2012  . CAROTID STENT     stents x 2   . CHOLECYSTECTOMY N/A 12/04/2015   Procedure: LAPAROSCOPIC CHOLECYSTECTOMY;  Surgeon: Aviva Signs, MD;  Location: [facility];  Service: General;  Laterality: N/A;  . CHOLECYSTECTOMY, LAPAROSCOPIC  12/05/2015  . CORONARY ANGIOPLASTY  2012  . INGUINAL HERNIA REPAIR     RIGHT GROIN  . TRIGGER FINGER RELEASE Right 01/08/2015   Procedure: RELEASE TRIGGER FINGER/A-1 PULLEY RIGHT MIDDLE FINGER;   Surgeon: Daryll Brod, MD;  Location: [facility];  Service: Orthopedics;  Laterality: Right;    Current Outpatient Medications  Medication Sig Dispense Refill  . aspirin 81 MG tablet Take 81 mg by mouth daily.      Marland Kitchen atorvastatin (LIPITOR) 10 MG tablet TAKE (1) TABLET BY MOUTH ONCE DAILY. 30 tablet 6  . carvedilol (COREG) 12.5 MG tablet TAKE (1) TABLET TWICE DAILY. 60 tablet 11  . CINNAMON PO Take 1 tablet by mouth 2 (two) times daily.    Marland Kitchen CRANBERRY PO Take 1 tablet by mouth 2 (two) times daily.    Marland Kitchen FORA LANCETS MISC     . FORA V30A BLOOD GLUCOSE TEST test strip     . ibuprofen (ADVIL,MOTRIN) 200 MG tablet Take 200 mg by mouth every 6 (six) hours as needed.    . insulin glargine (LANTUS) 100 UNIT/ML injection Inject 20 Units into the skin every morning. 60 units at bedtime    . Liraglutide (VICTOZA) 18 MG/3ML SOLN Inject 1.8 mg into the skin daily.      Marland Kitchen lisinopril (PRINIVIL,ZESTRIL) 20 MG tablet TAKE (1) TABLET TWICE DAILY. 60 tablet 6  . loratadine (CLARITIN) 10 MG tablet Take 10 mg by mouth daily.    . metFORMIN (GLUCOPHAGE) 500 MG tablet Take 1,000 mg by mouth 2 (two) times daily with a meal.     . nitroGLYCERIN (NITROSTAT) 0.4 MG SL tablet Place 1 tablet (0.4 mg total) under the tongue every 5 (five) minutes as needed. 25 tablet 3  .  NOVOLOG FLEXPEN 100 UNIT/ML FlexPen Inject 10 Units into the skin 3 (three) times daily with meals.     . prasugrel (EFFIENT) 10 MG TABS tablet TAKE 1 TABLET BY MOUTH ONCE DAILY. PATIENT NEEDS TO BE SEEN.    . QC PEN NEEDLES 31G X 8 MM MISC     . tamsulosin (FLOMAX) 0.4 MG CAPS capsule      No current facility-administered medications for this visit.    Allergies:  Patient has no allergy information on record.   Social History: The patient  reports that  has never smoked. he has never used smokeless tobacco. He reports that he does not drink alcohol or use drugs.   ROS:  Please see the history of present illness. Otherwise, complete  review of systems is positive for prostatism, now on Flomax.  All other systems are reviewed and negative.   Physical Exam: VS:  BP (!) 144/88 (BP Location: Right Arm)   Pulse 90   Ht 5\' 9"  (1.753 m)   Wt 238 lb (108 kg)   SpO2 94%   BMI 35.15 kg/m , BMI Body mass index is 35.15 kg/m.  Wt Readings from Last 3 Encounters:  04/22/17 238 lb (108 kg)  04/28/16 222 lb (100.7 kg)  11/29/15 222 lb (100.7 kg)    General: Obese male, appears comfortable at rest. HEENT: Conjunctiva and lids normal, oropharynx clear. Neck: Supple, no elevated JVP or carotid bruits, no thyromegaly. Lungs: Clear to auscultation, nonlabored breathing at rest. Cardiac: Regular rate and rhythm, no S3 or significant systolic murmur, no pericardial rub. Abdomen: Soft, nontender, bowel sounds present. Extremities: No pitting edema, distal pulses 1-2+. Skin: Warm and dry. Musculoskeletal: No kyphosis. Neuropsychiatric: Alert and oriented x3, affect grossly appropriate.  ECG: I personally reviewed the tracing from 11/28/2068 which showed sinus rhythm with Q-wave in lead III.  Recent Labwork:  11/29/2015: ALT 29; AST 22; BUN 21; Creatinine, Ser 0.91; Hemoglobin 14.7; Platelets 237; Potassium 4.3; Sodium 135   Other Studies Reviewed Today:  Lexiscan Cardiolite 04/17/2014: FINDINGS: Baseline ECG shows normal sinus rhythm at 83 beats per min. Lexiscan bolus was given in standard fashion. Heart rate increased from 84 beats per min up to 108 beats per min, and blood pressure increased from 132/91 up to 159/80. No chest pain was reported. No diagnostic ST segment changes were noted. Rare PVCs were noted.  Analysis of the raw perfusion data finds adequate radiotracer uptake within the myocardium.  Perfusion: There is a small, mild intensity, apical to basal, inferolateral defect noted that exhibits partial reversibility and is consistent with a region of mild ischemia. Summed stress score is 4.  Wall Motion:  Normal left ventricular wall motion. No left ventricular dilation.  Left Ventricular Ejection Fraction: 62 %  End diastolic volume 75 ml  End systolic volume 29 ml  IMPRESSION: 1. Small area of ischemia noted within the inferolateral wall.  2. Normal left ventricular wall motion.  3. Left ventricular ejection fraction 62%  4. Low-risk stress test findings*.  Assessment and Plan:  1. CAD status post DES to the circumflex in 2005 and DES to the LAD/diagonal in 2012. He is doing relatively well on medical therapy. We have discussed walking for exercise and further weight loss if possible. He is 3 years out from last ischemic assessment and we will arrange a Lexiscan Myoview.  2. Mixed hyperlipidemia. Continues to tolerate Lipitor. We will review his follow-up lab work when available.  3. Essential hypertension. No changes made present  regimen. We did discuss weight loss.  4. Type 2 diabetes mellitus, now on NovoLog along with remaining medications. He is following with Dr. Luan Pulling.  Current medicines were reviewed with the patient today.   Orders Placed This Encounter  Procedures  . NM Myocar Multi W/Spect W/Wall Motion / EF  . EKG 12-Lead    Disposition: Follow-up in one year, sooner if needed.  Signed, Satira Sark, MD, [facility] 04/22/2017 8:53 AM    [facility] [place]. [address], [place], [place] [address] Phone: [phone]; Fax: [phone]

## 2017-04-22 ENCOUNTER — Encounter: Payer: Self-pay | Admitting: Cardiology

## 2017-04-22 ENCOUNTER — Ambulatory Visit: Payer: BC Managed Care – PPO | Admitting: Cardiology

## 2017-04-22 VITALS — BP 144/88 | HR 90 | Ht 69.0 in | Wt 238.0 lb

## 2017-04-22 DIAGNOSIS — E1165 Type 2 diabetes mellitus with hyperglycemia: Secondary | ICD-10-CM

## 2017-04-22 DIAGNOSIS — I25119 Atherosclerotic heart disease of native coronary artery with unspecified angina pectoris: Secondary | ICD-10-CM | POA: Diagnosis not present

## 2017-04-22 DIAGNOSIS — I10 Essential (primary) hypertension: Secondary | ICD-10-CM

## 2017-04-22 DIAGNOSIS — I2510 Atherosclerotic heart disease of native coronary artery without angina pectoris: Secondary | ICD-10-CM | POA: Diagnosis not present

## 2017-04-22 DIAGNOSIS — E782 Mixed hyperlipidemia: Secondary | ICD-10-CM

## 2017-04-22 NOTE — Patient Instructions (Addendum)
Your physician wants you to follow-up in:  1 year with Dr.McDowell You will receive a reminder letter in the mail two months in advance. If you don't receive a letter, please call our office to schedule the follow-up appointment.   Your physician has requested that you have a lexiscan myoview. For further information please visit [email or web address]. Please follow instruction sheet, as given.    Your physician recommends that you continue on your current medications as directed. Please refer to the Current Medication list given to you today.   If you need a refill on your cardiac medications before your next appointment, please call your pharmacy.    No lab work ordered today     Thank you for choosing [facility] !

## 2017-04-29 ENCOUNTER — Encounter (HOSPITAL_COMMUNITY): Payer: Self-pay

## 2017-04-29 ENCOUNTER — Encounter (HOSPITAL_BASED_OUTPATIENT_CLINIC_OR_DEPARTMENT_OTHER)
Admission: RE | Admit: 2017-04-29 | Discharge: 2017-04-29 | Disposition: A | Discharge status: Home / Self Care | Payer: BC Managed Care – PPO | Source: Ambulatory Visit | Attending: Cardiology | Admitting: Cardiology

## 2017-04-29 ENCOUNTER — Encounter (HOSPITAL_COMMUNITY)
Admission: RE | Admit: 2017-04-29 | Discharge: 2017-04-29 | Disposition: A | Discharge status: Home / Self Care | Payer: BC Managed Care – PPO | Source: Ambulatory Visit | Attending: Cardiology | Admitting: Cardiology

## 2017-04-29 DIAGNOSIS — I2510 Atherosclerotic heart disease of native coronary artery without angina pectoris: Secondary | ICD-10-CM

## 2017-04-29 LAB — NM MYOCAR MULTI W/SPECT W/WALL MOTION / EF
CHL CUP NUCLEAR SRS: 1
LHR: 0.36
LV dias vol: 88 mL (ref 62–150)
LV sys vol: 54 mL
NUC STRESS TID: 1.17
Peak HR: 103 {beats}/min
Rest HR: 82 {beats}/min
SDS: 3
SSS: 4

## 2017-04-29 MED ORDER — SODIUM CHLORIDE 0.9% FLUSH
INTRAVENOUS | Status: AC
  Administered 2017-04-29: 10 mL via INTRAVENOUS
  Filled 2017-04-29: qty 10

## 2017-04-29 MED ORDER — REGADENOSON 0.4 MG/5ML IV SOLN
INTRAVENOUS | Status: AC
  Administered 2017-04-29: 0.4 mg via INTRAVENOUS
  Filled 2017-04-29: qty 5

## 2017-04-29 MED ORDER — TECHNETIUM TC 99M TETROFOSMIN IV KIT
10.0000 | PACK | Freq: Once | INTRAVENOUS | Status: AC | PRN
  Administered 2017-04-29: 11 via INTRAVENOUS

## 2017-04-29 MED ORDER — TECHNETIUM TC 99M TETROFOSMIN IV KIT
30.0000 | PACK | Freq: Once | INTRAVENOUS | Status: AC | PRN
  Administered 2017-04-29: 31 via INTRAVENOUS

## 2017-05-05 ENCOUNTER — Other Ambulatory Visit (HOSPITAL_COMMUNITY): Payer: Self-pay | Admitting: Urology

## 2017-05-05 DIAGNOSIS — N433 Hydrocele, unspecified: Secondary | ICD-10-CM

## 2017-05-13 ENCOUNTER — Ambulatory Visit (HOSPITAL_COMMUNITY)
Admission: RE | Admit: 2017-05-13 | Discharge: 2017-05-13 | Disposition: A | Discharge status: Home / Self Care | Payer: BC Managed Care – PPO | Source: Ambulatory Visit | Attending: Urology | Admitting: Urology

## 2017-05-13 DIAGNOSIS — N433 Hydrocele, unspecified: Secondary | ICD-10-CM | POA: Diagnosis not present

## 2017-05-13 DIAGNOSIS — N503 Cyst of epididymis: Secondary | ICD-10-CM | POA: Diagnosis not present

## 2017-05-25 ENCOUNTER — Telehealth: Payer: Self-pay | Admitting: Cardiology

## 2017-05-25 NOTE — Telephone Encounter (Signed)
LM with [name] to call back.I need to know type of procedure

## 2017-05-25 NOTE — Telephone Encounter (Signed)
Pt having bi-lat hydrocelectomy under general anesthesia, they will fax form now

## 2017-05-25 NOTE — Telephone Encounter (Signed)
Form completed and faxed back to [facility]

## 2017-05-25 NOTE — Telephone Encounter (Signed)
[name] w/ [facility] in [place] lvm stating this pt is needing a clearance on when to stop his blood thinner & Asprin  for a procedure on 06/02/17. Please give [name] a call @ [phone] ext [phone]

## 2017-05-26 ENCOUNTER — Other Ambulatory Visit: Payer: Self-pay | Admitting: Urology

## 2017-05-27 NOTE — Progress Notes (Signed)
heamaglobin a1c done 04-22-17 dr Luan Pulling results on chart and faxed to dr winter at [facility] u[facility], fax confirmation received

## 2017-05-27 NOTE — Progress Notes (Signed)
lov dr Domenic Polite 04-27-17 epic Stress test 04-29-17 epic ekg 04-27-17 epic Cardiac clearance note dr Domenic Polite on chart for 06-02-2017 surgery

## 2017-05-27 NOTE — Patient Instructions (Addendum)
[name] [name]  05/27/2017   Your procedure is scheduled on: Wednesday 06-02-2017  Report to [facility] Main  Entrance  Report to admitting at 900 AM    Call this number if you have problems the morning of surgery [phone]   Remember: Do not eat food or drink liquids :After Midnight.  How to Manage Your Diabetes Before and After Surgery  Why is it important to control my blood sugar before and after surgery? . Improving blood sugar levels before and after surgery helps healing and can limit problems. . A way of improving blood sugar control is eating a healthy diet by: o  Eating less sugar and carbohydrates o  Increasing activity/exercise o  Talking with your doctor about reaching your blood sugar goals . High blood sugars (greater than 180 mg/dL) can raise your risk of infections and slow your recovery, so you will need to focus on controlling your diabetes during the weeks before surgery. . Make sure that the doctor who takes care of your diabetes knows about your planned surgery including the date and location.  How do I manage my blood sugar before surgery? . Check your blood sugar at least 4 times a day, starting 2 days before surgery, to make sure that the level is not too high or low. o Check your blood sugar the morning of your surgery when you wake up and every 2 hours until you get to the Short Stay unit. . If your blood sugar is less than 70 mg/dL, you will need to treat for low blood sugar: o Do not take insulin. o Treat a low blood sugar (less than 70 mg/dL) with  cup of clear juice (cranberry or apple), 4 glucose tablets, OR glucose gel. o Recheck blood sugar in 15 minutes after treatment (to make sure it is greater than 70 mg/dL). If your blood sugar is not greater than 70 mg/dL on recheck, call [phone] for further instructions. . Report your blood sugar to the short stay nurse when you get to Short Stay.  . If you are admitted to the  hospital after surgery: o Your blood sugar will be checked by the staff and you will probably be given insulin after surgery (instead of oral diabetes medicines) to make sure you have good blood sugar levels. o The goal for blood sugar control after surgery is 80-180 mg/dL.   WHAT DO I DO ABOUT MY DIABETES MEDICATION?  .  THE DAY BEFORE SURGERY 06-01-17  TAKE ALL OF YOUR MORNING DOSE OF LANTUS INSULIN, TAKE 1/2 OF YOUR BEDTIME DOSE OF LANTUS INSULIN (TAKE 35 UNITS).  . THE DAY BEFORE BEFORE SURGERY, 06-01-17 TAKE YOUR METFORMIN AND VICTOZA AS USUAL.    . THE MORNING OF SURGERY, 06-02-17 TAKE 1/2 OF YOUR USUAL DOSE OF LANTUS INSULIN (TAKE 20 UNITS)  . THE DAY OF SURGERY, 06-02-17 DO NOT TAKE YOUR METFORMIN OF YOUR VICTOZA.   Take these medicines the morning of surgery with A SIP OF WATER: ALLERGA D, PANTAPRAZOLE ([place]), CARVEDILOL              You may not have any metal on your body including hair pins and              piercings  Do not wear jewelry, make-up, lotions, powders or perfumes, deodorant             Do not wear nail  polish.  Do not shave  48 hours prior to surgery.              Men may shave face and neck.   Do not bring valuables to the hospital. [place].  Contacts, dentures or bridgework may not be worn into surgery.  Leave suitcase in the car. After surgery it may be brought to your room.     Patients discharged the day of surgery will not be allowed to drive home.  Name and phone number of your driver:ANN WIFE DRIVER CELL [phone]  Special Instructions: N/A              Please read over the following fact sheets you were given: _____________________________________________________________________   [facility] - Preparing for Surgery Before surgery, you can play an important role.  Because skin is not sterile, your skin needs to be as free of germs as possible.  You can reduce the number of germs on your skin by  washing with CHG (chlorahexidine gluconate) soap before surgery.  CHG is an antiseptic cleaner which kills germs and bonds with the skin to continue killing germs even after washing. Please DO NOT use if you have an allergy to CHG or antibacterial soaps.  If your skin becomes reddened/irritated stop using the CHG and inform your nurse when you arrive at Short Stay. Do not shave (including legs and underarms) for at least 48 hours prior to the first CHG shower.  You may shave your face/neck. Please follow these instructions carefully:  1.  Shower with CHG Soap the night before surgery and the  morning of Surgery.  2.  If you choose to wash your hair, wash your hair first as usual with your  normal  shampoo.  3.  After you shampoo, rinse your hair and body thoroughly to remove the  shampoo.                           4.  Use CHG as you would any other liquid soap.  You can apply chg directly  to the skin and wash                       Gently with a scrungie or clean washcloth.  5.  Apply the CHG Soap to your body ONLY FROM THE NECK DOWN.   Do not use on face/ open                           Wound or open sores. Avoid contact with eyes, ears mouth and genitals (private parts).                       Wash face,  Genitals (private parts) with your normal soap.             6.  Wash thoroughly, paying special attention to the area where your surgery  will be performed.  7.  Thoroughly rinse your body with warm water from the neck down.  8.  DO NOT shower/wash with your normal soap after using and rinsing off  the CHG Soap.                9.  Pat yourself dry with a clean towel.  10.  Wear clean pajamas.            11.  Place clean sheets on your bed the night of your first shower and do not  sleep with pets. Day of Surgery : Do not apply any lotions/deodorants the morning of surgery.  Please wear clean clothes to the hospital/surgery center.  FAILURE TO FOLLOW THESE INSTRUCTIONS MAY RESULT IN THE  CANCELLATION OF YOUR SURGERY PATIENT SIGNATURE_________________________________  NURSE SIGNATURE__________________________________  ________________________________________________________________________

## 2017-05-31 ENCOUNTER — Encounter (HOSPITAL_COMMUNITY): Payer: Self-pay

## 2017-05-31 ENCOUNTER — Other Ambulatory Visit: Payer: Self-pay

## 2017-05-31 ENCOUNTER — Other Ambulatory Visit: Payer: Self-pay | Admitting: Cardiology

## 2017-05-31 ENCOUNTER — Encounter (HOSPITAL_COMMUNITY)
Admission: RE | Admit: 2017-05-31 | Discharge: 2017-05-31 | Disposition: A | Discharge status: Home / Self Care | Payer: BC Managed Care – PPO | Source: Ambulatory Visit | Attending: Urology | Admitting: Urology

## 2017-05-31 DIAGNOSIS — N433 Hydrocele, unspecified: Secondary | ICD-10-CM | POA: Diagnosis present

## 2017-05-31 DIAGNOSIS — I10 Essential (primary) hypertension: Secondary | ICD-10-CM | POA: Diagnosis not present

## 2017-05-31 DIAGNOSIS — D176 Benign lipomatous neoplasm of spermatic cord: Secondary | ICD-10-CM | POA: Diagnosis not present

## 2017-05-31 DIAGNOSIS — I2510 Atherosclerotic heart disease of native coronary artery without angina pectoris: Secondary | ICD-10-CM | POA: Diagnosis not present

## 2017-05-31 DIAGNOSIS — Z87442 Personal history of urinary calculi: Secondary | ICD-10-CM | POA: Diagnosis not present

## 2017-05-31 DIAGNOSIS — I252 Old myocardial infarction: Secondary | ICD-10-CM | POA: Diagnosis not present

## 2017-05-31 DIAGNOSIS — E119 Type 2 diabetes mellitus without complications: Secondary | ICD-10-CM | POA: Diagnosis not present

## 2017-05-31 DIAGNOSIS — K219 Gastro-esophageal reflux disease without esophagitis: Secondary | ICD-10-CM | POA: Diagnosis not present

## 2017-05-31 DIAGNOSIS — Z7982 Long term (current) use of aspirin: Secondary | ICD-10-CM | POA: Diagnosis not present

## 2017-05-31 DIAGNOSIS — F419 Anxiety disorder, unspecified: Secondary | ICD-10-CM | POA: Diagnosis not present

## 2017-05-31 DIAGNOSIS — E782 Mixed hyperlipidemia: Secondary | ICD-10-CM | POA: Diagnosis not present

## 2017-05-31 DIAGNOSIS — N400 Enlarged prostate without lower urinary tract symptoms: Secondary | ICD-10-CM | POA: Diagnosis not present

## 2017-05-31 DIAGNOSIS — Z7984 Long term (current) use of oral hypoglycemic drugs: Secondary | ICD-10-CM | POA: Diagnosis not present

## 2017-05-31 DIAGNOSIS — Z79899 Other long term (current) drug therapy: Secondary | ICD-10-CM | POA: Diagnosis not present

## 2017-05-31 HISTORY — DX: Personal history of urinary calculi: Z87.442

## 2017-05-31 HISTORY — DX: Gastro-esophageal reflux disease without esophagitis: K21.9

## 2017-05-31 LAB — BASIC METABOLIC PANEL
ANION GAP: 10 (ref 5–15)
BUN: 19 mg/dL (ref 6–20)
CALCIUM: 9.3 mg/dL (ref 8.9–10.3)
CHLORIDE: 101 mmol/L (ref 101–111)
CO2: 28 mmol/L (ref 22–32)
Creatinine, Ser: 1.04 mg/dL (ref 0.61–1.24)
GFR calc Af Amer: >60 mL/min (ref >60)
GFR calc non Af Amer: >60 mL/min (ref >60)
GLUCOSE: 143 mg/dL — AB (ref 65–99)
Potassium: 4.8 mmol/L (ref 3.5–5.1)
Sodium: 139 mmol/L (ref 135–145)

## 2017-05-31 LAB — CBC
HCT: 44.7 % (ref 39.0–52.0)
HEMOGLOBIN: 14.4 g/dL (ref 13.0–17.0)
MCH: 28.3 pg (ref 26.0–34.0)
MCHC: 32.2 g/dL (ref 30.0–36.0)
MCV: 87.8 fL (ref 78.0–100.0)
Platelets: 280 10*3/uL (ref 150–400)
RBC: 5.09 MIL/uL (ref 4.22–5.81)
RDW: 14.1 % (ref 11.5–15.5)
WBC: 7.7 10*3/uL (ref 4.0–10.5)

## 2017-05-31 LAB — GLUCOSE, CAPILLARY: Glucose-Capillary: 149 mg/dL — ABNORMAL HIGH (ref 65–99)

## 2017-06-01 NOTE — H&P (Signed)
Urology Preoperative H&P   Chief Complaint: Bilateral scrotal swelling  History of Present Illness: [name] [name] is a 62 y.o. male referred by Dr. Willey Blade for an evaluation of a possible left sided hydrocele. The patient reports a 2-3 month hx of progressive swelling of the left hemi-scrotum. He states that it is not painful, but "just un-comfortable" with movement and sitting. He denies pain on the right side, a prior history of scrotal surgery or recent trauma. He did have laparoscopic right IHR ~12 years ago. He does report a UTI ~18 months ago that resolved after a course of PO abx. Denies interval UTIs, dysuria or hematuria. He does have a history of BPH with LUTS and takes tamsulosin daily.   He had a scrotal ultrasound on 05/13/2017 that demonstrated bilateral hydroceles with no testicular lesions or signs of epididymal orchitis.  Past Medical History:  Diagnosis Date  . Anxiety   . Coronary atherosclerosis of native coronary artery    DES distal circumflex 2005, DES LAD/diagonal bifurcation 7/12  . Diabetes mellitus   . Dyspnea    WITH EXERTION  . Essential hypertension, benign   . GERD (gastroesophageal reflux disease)   . History of kidney stones    PASSED ON OWN  . Mixed hyperlipidemia   . Myocardial infarction ([place])    Anterolateral with VF arrest 7/12  . Type 2 diabetes mellitus ([place])    X 30 YRS    Past Surgical History:  Procedure Laterality Date  . CARDIAC CATHETERIZATION  2012  . CAROTID STENT     stents x 2   . CHOLECYSTECTOMY N/A 12/04/2015   Procedure: LAPAROSCOPIC CHOLECYSTECTOMY;  Surgeon: Aviva Signs, MD;  Location: [facility];  Service: General;  Laterality: N/A;  . CHOLECYSTECTOMY, LAPAROSCOPIC  12/05/2015  . CORONARY ANGIOPLASTY  2012   STENT X 1 2012, STENT X 1 YRS BEFORE  . INGUINAL HERNIA REPAIR     RIGHT GROIN  . TRIGGER FINGER RELEASE Right 01/08/2015   Procedure: RELEASE TRIGGER FINGER/A-1 PULLEY RIGHT MIDDLE FINGER;  Surgeon: Daryll Brod, MD;   Location: [place];  Service: Orthopedics;  Laterality: Right;    Allergies: No Known Allergies  Family History  Problem Relation Age of Onset  . Hyperlipidemia Mother   . Hypertension Mother   . Hyperlipidemia Father   . Hypertension Father   . Hypertension Unknown     Social History:  reports that  has never smoked. he has never used smokeless tobacco. He reports that he does not drink alcohol or use drugs.  ROS: A complete review of systems was performed.  All systems are negative except for pertinent findings as noted.  Physical Exam:  Vital signs in last 24 hours:   Constitutional:  Alert and oriented, No acute distress Cardiovascular: Regular rate and rhythm, No JVD Respiratory: Normal respiratory effort, Lungs clear bilaterally GI: Abdomen is soft, nontender, nondistended, no abdominal masses GU: No CVA tenderness, bilaterally enlarged scrotum with no palpable solid masses or lesions aside from the peritesticular fluid Lymphatic: No lymphadenopathy Neurologic: Grossly intact, no focal deficits Psychiatric: Normal mood and affect  Laboratory Data:  Recent Labs    05/31/17 0829  WBC 7.7  HGB 14.4  HCT 44.7  PLT 280    Recent Labs    05/31/17 0829  NA 139  K 4.8  CL 101  GLUCOSE 143*  BUN 19  CALCIUM 9.3  CREATININE 1.04     No results found for this or any previous visit (  from the past 24 hour(s)). No results found for this or any previous visit (from the past 240 hour(s)).  Renal Function: Recent Labs    05/31/17 0829  CREATININE 1.04   Estimated Creatinine Clearance: 87.4 mL/min (by C-G formula based on SCr of 1.04 mg/dL).  Radiologic Imaging: No results found.  I independently reviewed the above imaging studies.  Assessment and Plan WARIS RODGER is a 62 y.o. male with bilateral hydroceles  -The risks, benefits and alternatives of bilateral hydrocelectomy was discussed with the patient. Risks include but are not  limited bleeding complications, wound infection, chronic testicular pain, recurrence of the hydrocele, testicular loss and the inherent risks of general anesthesia.    Ellison Hughs, MD 06/01/2017, 8:47 AM  [facility] Pager: [phone]

## 2017-06-02 ENCOUNTER — Other Ambulatory Visit: Payer: Self-pay

## 2017-06-02 ENCOUNTER — Ambulatory Visit (HOSPITAL_COMMUNITY)
Admission: RE | Admit: 2017-06-02 | Discharge: 2017-06-02 | Disposition: A | Discharge status: Home / Self Care | Payer: BC Managed Care – PPO | Source: Ambulatory Visit | Attending: Urology | Admitting: Urology

## 2017-06-02 ENCOUNTER — Ambulatory Visit (HOSPITAL_COMMUNITY): Payer: BC Managed Care – PPO | Admitting: Anesthesiology

## 2017-06-02 ENCOUNTER — Encounter (HOSPITAL_COMMUNITY)
Admission: RE | Disposition: A | Discharge status: Home / Self Care | Payer: Self-pay | Source: Ambulatory Visit | Attending: Urology

## 2017-06-02 ENCOUNTER — Encounter (HOSPITAL_COMMUNITY): Payer: Self-pay | Admitting: Emergency Medicine

## 2017-06-02 DIAGNOSIS — D176 Benign lipomatous neoplasm of spermatic cord: Secondary | ICD-10-CM | POA: Insufficient documentation

## 2017-06-02 DIAGNOSIS — I2510 Atherosclerotic heart disease of native coronary artery without angina pectoris: Secondary | ICD-10-CM | POA: Insufficient documentation

## 2017-06-02 DIAGNOSIS — N433 Hydrocele, unspecified: Secondary | ICD-10-CM | POA: Insufficient documentation

## 2017-06-02 DIAGNOSIS — I252 Old myocardial infarction: Secondary | ICD-10-CM | POA: Insufficient documentation

## 2017-06-02 DIAGNOSIS — I10 Essential (primary) hypertension: Secondary | ICD-10-CM | POA: Insufficient documentation

## 2017-06-02 DIAGNOSIS — Z79899 Other long term (current) drug therapy: Secondary | ICD-10-CM | POA: Insufficient documentation

## 2017-06-02 DIAGNOSIS — K219 Gastro-esophageal reflux disease without esophagitis: Secondary | ICD-10-CM | POA: Insufficient documentation

## 2017-06-02 DIAGNOSIS — Z87442 Personal history of urinary calculi: Secondary | ICD-10-CM | POA: Insufficient documentation

## 2017-06-02 DIAGNOSIS — Z7982 Long term (current) use of aspirin: Secondary | ICD-10-CM | POA: Insufficient documentation

## 2017-06-02 DIAGNOSIS — E782 Mixed hyperlipidemia: Secondary | ICD-10-CM | POA: Insufficient documentation

## 2017-06-02 DIAGNOSIS — E119 Type 2 diabetes mellitus without complications: Secondary | ICD-10-CM | POA: Insufficient documentation

## 2017-06-02 DIAGNOSIS — N400 Enlarged prostate without lower urinary tract symptoms: Secondary | ICD-10-CM | POA: Insufficient documentation

## 2017-06-02 DIAGNOSIS — F419 Anxiety disorder, unspecified: Secondary | ICD-10-CM | POA: Insufficient documentation

## 2017-06-02 DIAGNOSIS — Z7984 Long term (current) use of oral hypoglycemic drugs: Secondary | ICD-10-CM | POA: Insufficient documentation

## 2017-06-02 HISTORY — PX: HYDROCELE EXCISION: SHX482

## 2017-06-02 LAB — GLUCOSE, CAPILLARY
Glucose-Capillary: 212 mg/dL — ABNORMAL HIGH (ref 65–99)
Glucose-Capillary: 192 mg/dL — ABNORMAL HIGH (ref 65–99)

## 2017-06-02 SURGERY — HYDROCELECTOMY
Anesthesia: General | Laterality: Bilateral

## 2017-06-02 MED ORDER — EPHEDRINE SULFATE 50 MG/ML IJ SOLN
INTRAMUSCULAR | Status: DC | PRN
  Administered 2017-06-02 (×3): 5 mg via INTRAVENOUS

## 2017-06-02 MED ORDER — CEFAZOLIN SODIUM-DEXTROSE 2-4 GM/100ML-% IV SOLN
2.0000 g | Freq: Once | INTRAVENOUS | Status: AC
  Administered 2017-06-02: 2 g via INTRAVENOUS
  Filled 2017-06-02: qty 100

## 2017-06-02 MED ORDER — CEPHALEXIN 500 MG PO CAPS: 500.0000 mg | ORAL_CAPSULE | Freq: Three times a day (TID) | ORAL | 0 refills | Status: AC

## 2017-06-02 MED ORDER — HYDROCODONE-ACETAMINOPHEN 5-325 MG PO TABS
ORAL_TABLET | ORAL | Status: AC
  Filled 2017-06-02: qty 1

## 2017-06-02 MED ORDER — ONDANSETRON HCL 4 MG PO TABS: 4.0000 mg | ORAL_TABLET | Freq: Every day | ORAL | 1 refills | Status: DC | PRN

## 2017-06-02 MED ORDER — EPHEDRINE 5 MG/ML INJ
INTRAVENOUS | Status: AC
  Filled 2017-06-02: qty 10

## 2017-06-02 MED ORDER — KETOROLAC TROMETHAMINE 30 MG/ML IJ SOLN
30.0000 mg | Freq: Once | INTRAMUSCULAR | Status: DC | PRN
  Administered 2017-06-02: 30 mg via INTRAVENOUS

## 2017-06-02 MED ORDER — FENTANYL CITRATE (PF) 250 MCG/5ML IJ SOLN
INTRAMUSCULAR | Status: AC
Start: 2017-06-02 — End: ?
  Filled 2017-06-02: qty 5

## 2017-06-02 MED ORDER — PROMETHAZINE HCL 25 MG/ML IJ SOLN: 6.2500 mg | INTRAMUSCULAR | Status: DC | PRN

## 2017-06-02 MED ORDER — ONDANSETRON HCL 4 MG/2ML IJ SOLN
INTRAMUSCULAR | Status: DC | PRN
  Administered 2017-06-02: 4 mg via INTRAVENOUS

## 2017-06-02 MED ORDER — PROPOFOL 10 MG/ML IV BOLUS
INTRAVENOUS | Status: DC | PRN
  Administered 2017-06-02: 150 mg via INTRAVENOUS

## 2017-06-02 MED ORDER — PHENYLEPHRINE 40 MCG/ML (10ML) SYRINGE FOR IV PUSH (FOR BLOOD PRESSURE SUPPORT)
PREFILLED_SYRINGE | INTRAVENOUS | Status: AC
  Filled 2017-06-02: qty 10

## 2017-06-02 MED ORDER — PROPOFOL 10 MG/ML IV BOLUS
INTRAVENOUS | Status: AC
  Filled 2017-06-02: qty 20

## 2017-06-02 MED ORDER — DEXAMETHASONE SODIUM PHOSPHATE 10 MG/ML IJ SOLN
INTRAMUSCULAR | Status: DC | PRN
  Administered 2017-06-02: 10 mg via INTRAVENOUS

## 2017-06-02 MED ORDER — FENTANYL CITRATE (PF) 100 MCG/2ML IJ SOLN
INTRAMUSCULAR | Status: DC | PRN
  Administered 2017-06-02 (×2): 50 ug via INTRAVENOUS

## 2017-06-02 MED ORDER — MIDAZOLAM HCL 5 MG/5ML IJ SOLN
INTRAMUSCULAR | Status: DC | PRN
  Administered 2017-06-02: 2 mg via INTRAVENOUS

## 2017-06-02 MED ORDER — FENTANYL CITRATE (PF) 100 MCG/2ML IJ SOLN: 25.0000 ug | INTRAMUSCULAR | Status: DC | PRN

## 2017-06-02 MED ORDER — LIDOCAINE HCL (CARDIAC) 10 MG/ML IV SOLN: INTRAVENOUS | Status: DC | PRN

## 2017-06-02 MED ORDER — KETOROLAC TROMETHAMINE 30 MG/ML IJ SOLN
INTRAMUSCULAR | Status: AC
  Administered 2017-06-02: 30 mg via INTRAVENOUS
  Filled 2017-06-02: qty 1

## 2017-06-02 MED ORDER — HYDROCODONE-ACETAMINOPHEN 5-325 MG PO TABS: 1.0000 | ORAL_TABLET | ORAL | 0 refills | Status: DC | PRN

## 2017-06-02 MED ORDER — BUPIVACAINE HCL (PF) 0.25 % IJ SOLN
INTRAMUSCULAR | Status: AC
  Filled 2017-06-02: qty 30

## 2017-06-02 MED ORDER — ONDANSETRON HCL 4 MG/2ML IJ SOLN
INTRAMUSCULAR | Status: AC
  Filled 2017-06-02: qty 2

## 2017-06-02 MED ORDER — HYDROCODONE-ACETAMINOPHEN 5-325 MG PO TABS
1.0000 | ORAL_TABLET | ORAL | Status: DC | PRN
Start: 2017-06-02 — End: 2017-06-02
  Administered 2017-06-02: 1 via ORAL

## 2017-06-02 MED ORDER — PHENYLEPHRINE HCL 10 MG/ML IJ SOLN
INTRAMUSCULAR | Status: DC | PRN
  Administered 2017-06-02 (×5): 80 ug via INTRAVENOUS

## 2017-06-02 MED ORDER — MIDAZOLAM HCL 2 MG/2ML IJ SOLN
INTRAMUSCULAR | Status: AC
  Filled 2017-06-02: qty 2

## 2017-06-02 MED ORDER — LIDOCAINE HCL (CARDIAC) 20 MG/ML IV SOLN
INTRAVENOUS | Status: DC | PRN
  Administered 2017-06-02: 100 mg via INTRATRACHEAL

## 2017-06-02 MED ORDER — FENTANYL CITRATE (PF) 100 MCG/2ML IJ SOLN
INTRAMUSCULAR | Status: AC
  Filled 2017-06-02: qty 2

## 2017-06-02 MED ORDER — LACTATED RINGERS IV SOLN
INTRAVENOUS | Status: DC
  Administered 2017-06-02 (×2): via INTRAVENOUS

## 2017-06-02 SURGICAL SUPPLY — 24 items
BLADE HEX COATED 2.75 (ELECTRODE) ×1 IMPLANT
BNDG GAUZE ELAST 4 BULKY (GAUZE/BANDAGES/DRESSINGS) ×1 IMPLANT
COVER SURGICAL LIGHT HANDLE (MISCELLANEOUS) ×3 IMPLANT
DRAPE LAPAROTOMY T 98X78 PEDS (DRAPES) ×3 IMPLANT
ELECT NDL TIP 2.8 STRL (NEEDLE) ×1 IMPLANT
ELECT NEEDLE TIP 2.8 STRL (NEEDLE) ×3 IMPLANT
ELECT REM PT RETURN 15FT ADLT (MISCELLANEOUS) ×3 IMPLANT
GLOVE BIOGEL M STRL SZ7.5 (GLOVE) ×7 IMPLANT
GOWN STRL REUS W/TWL LRG LVL3 (GOWN DISPOSABLE) ×5 IMPLANT
KIT BASIN OR (CUSTOM PROCEDURE TRAY) ×3 IMPLANT
NEEDLE HYPO 22GX1.5 SAFETY (NEEDLE) IMPLANT
NS IRRIG 1000ML POUR BTL (IV SOLUTION) ×3 IMPLANT
PACK GENERAL/GYN (CUSTOM PROCEDURE TRAY) ×3 IMPLANT
SUPPORT SCROTAL LG STRP (MISCELLANEOUS) ×1 IMPLANT
SUPPORTER ATHLETIC LG (MISCELLANEOUS)
SUT CHROMIC 3 0 SH 27 (SUTURE) ×6 IMPLANT
SUT MON AB 3-0 SH 27 (SUTURE) ×3
SUT MON AB 3-0 SH27 (SUTURE) ×1 IMPLANT
SUT SILK 0 SH 30 (SUTURE) ×1 IMPLANT
SUT VIC AB 2-0 UR5 27 (SUTURE) IMPLANT
SUT VICRYL 0 TIES 12 18 (SUTURE) IMPLANT
SYR CONTROL 10ML LL (SYRINGE) IMPLANT
TOWEL OR 17X26 10 PK STRL BLUE (TOWEL DISPOSABLE) ×4 IMPLANT
WATER STERILE IRR 1000ML POUR (IV SOLUTION) ×3 IMPLANT

## 2017-06-02 NOTE — Anesthesia Preprocedure Evaluation (Addendum)
Anesthesia Evaluation  Patient identified by MRN, date of birth, ID band Patient awake    Reviewed: Allergy & Precautions, NPO status , Patient's Chart, lab work & pertinent test results  Airway Mallampati: II  TM Distance: >3 FB Neck ROM: Full    Dental no notable dental hx.    Pulmonary neg pulmonary ROS,    Pulmonary exam normal breath sounds clear to auscultation       Cardiovascular hypertension, + CAD, + Past MI and + Cardiac Stents  Normal cardiovascular exam Rhythm:Regular Rate:Normal     Neuro/Psych negative neurological ROS  negative psych ROS   GI/Hepatic negative GI ROS, Neg liver ROS,   Endo/Other  diabetes  Renal/GU negative Renal ROS  negative genitourinary   Musculoskeletal negative musculoskeletal ROS (+)   Abdominal   Peds negative pediatric ROS (+)  Hematology negative hematology ROS (+)   Anesthesia Other Findings   Reproductive/Obstetrics negative OB ROS                             Anesthesia Physical Anesthesia Plan  ASA: III  Anesthesia Plan: General   Post-op Pain Management:    Induction: Intravenous  PONV Risk Score and Plan: 2 and Ondansetron and Treatment may vary due to age or medical condition  Airway Management Planned: LMA  Additional Equipment:   Intra-op Plan:   Post-operative Plan: Extubation in OR  Informed Consent: I have reviewed the patients History and Physical, chart, labs and discussed the procedure including the risks, benefits and alternatives for the proposed anesthesia with the patient or authorized representative who has indicated his/her understanding and acceptance.   Dental advisory given  Plan Discussed with: CRNA and Surgeon  Anesthesia Plan Comments:         Anesthesia Quick Evaluation

## 2017-06-02 NOTE — Anesthesia Procedure Notes (Signed)
Procedure Name: LMA Insertion Date/Time: 06/02/2017 11:21 AM Performed by: Glory Buff, CRNA Pre-anesthesia Checklist: Patient identified, Emergency Drugs available, Suction available and Patient being monitored Patient Re-evaluated:Patient Re-evaluated prior to induction Oxygen Delivery Method: Circle system utilized Preoxygenation: Pre-oxygenation with 100% oxygen Induction Type: IV induction LMA: LMA with gastric port inserted LMA Size: 4.0 Number of attempts: 1 Placement Confirmation: positive ETCO2 Tube secured with: Tape Dental Injury: Teeth and Oropharynx as per pre-operative assessment

## 2017-06-02 NOTE — Anesthesia Postprocedure Evaluation (Signed)
Anesthesia Post Note  Patient: [name] [name]  Procedure(s) Performed: HYDROCELECTOMY ADULT (Bilateral )     Patient location during evaluation: PACU Anesthesia Type: General Level of consciousness: awake and alert Pain management: pain level controlled Vital Signs Assessment: post-procedure vital signs reviewed and stable Respiratory status: spontaneous breathing, nonlabored ventilation, respiratory function stable and patient connected to nasal cannula oxygen Cardiovascular status: blood pressure returned to baseline and stable Postop Assessment: no apparent nausea or vomiting Anesthetic complications: no    Last Vitals:  Vitals:   06/02/17 1330 06/02/17 1344  BP: (!) 144/80 (!) 151/93  Pulse: 81 81  Resp: 16 15  Temp: 36.4 C 36.5 C  SpO2: 93% 93%    Last Pain:  Vitals:   06/02/17 1330  TempSrc:   PainSc: 3                  Arieal Cuoco S

## 2017-06-02 NOTE — Discharge Instructions (Signed)
General Anesthesia, Adult, Care After °These instructions provide you with information about caring for yourself after your procedure. Your health care provider may also give you more specific instructions. Your treatment has been planned according to current medical practices, but problems sometimes occur. Call your health care provider if you have any problems or questions after your procedure. °What can I expect after the procedure? °After the procedure, it is common to have: °· Vomiting. °· A sore throat. °· Mental slowness. ° °It is common to feel: °· Nauseous. °· Cold or shivery. °· Sleepy. °· Tired. °· Sore or achy, even in parts of your body where you did not have surgery. ° °Follow these instructions at home: °For at least 24 hours after the procedure: °· Do not: °? Participate in activities where you could fall or become injured. °? Drive. °? Use heavy machinery. °? Drink alcohol. °? Take sleeping pills or medicines that cause drowsiness. °? Make important decisions or sign legal documents. °? Take care of children on your own. °· Rest. °Eating and drinking °· If you vomit, drink water, juice, or soup when you can drink without vomiting. °· Drink enough fluid to keep your urine clear or pale yellow. °· Make sure you have little or no nausea before eating solid foods. °· Follow the diet recommended by your health care provider. °General instructions °· Have a responsible adult stay with you until you are awake and alert. °· Return to your normal activities as told by your health care provider. Ask your health care provider what activities are safe for you. °· Take over-the-counter and prescription medicines only as told by your health care provider. °· If you smoke, do not smoke without supervision. °· Keep all follow-up visits as told by your health care provider. This is important. °Contact a health care provider if: °· You continue to have nausea or vomiting at home, and medicines are not helpful. °· You  cannot drink fluids or start eating again. °· You cannot urinate after 8-12 hours. °· You develop a skin rash. °· You have fever. °· You have increasing redness at the site of your procedure. °Get help right away if: °· You have difficulty breathing. °· You have chest pain. °· You have unexpected bleeding. °· You feel that you are having a life-threatening or urgent problem. °This information is not intended to replace advice given to you by your health care provider. Make sure you discuss any questions you have with your health care provider. °Document Released: 06/22/2000 Document Revised: 08/19/2015 Document Reviewed: 02/28/2015 °Elsevier Interactive Patient Education © 2018 [employer] ° °

## 2017-06-02 NOTE — Interval H&P Note (Signed)
History and Physical Interval Note:  06/02/2017 11:06 AM  [name] [name]  has presented today for surgery, with the diagnosis of BILATERAL HYDROCELE  The various methods of treatment have been discussed with the patient and family. After consideration of risks, benefits and other options for treatment, the patient has consented to  Procedure(s) with comments: HYDROCELECTOMY ADULT (Bilateral) - ONLY NEEDS 45 MIN as a surgical intervention .  The patient's history has been reviewed, patient examined, no change in status, stable for surgery.  I have reviewed the patient's chart and labs.  Questions were answered to the patient's satisfaction.     Conception Oms Winter

## 2017-06-02 NOTE — Op Note (Signed)
Operative Note  Preoperative diagnosis:  1.  Bilateral hydroceles  Postoperative diagnosis: 1.  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  Bilateral hydrocelectomy   Surgeon: Ellison Hughs, MD  Assistants: None  Anesthesia: General LMA  Complications: None  EBL: 5 mL  Specimens: 1.  Bilateral hydrocele sacs 2.  Bilateral spermatic cord lipomas  Drains/Catheters: 1.  None  Intraoperative findings:   1.  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  No testicular masses or lesion, bilaterally  Indication:  [name] [name] is a 62 y.o. male  referred by Dr. Willey Blade for an evaluation of a possible left sided hydrocele. The patient reports a 2-3 month hx of progressive swelling of the left hemi-scrotum. He states that it is not painful, but "just un-comfortable" with movement and sitting. He denies pain on the right side, a prior history of scrotal surgery or recent trauma. He did have laparoscopic right IHR ~12 years ago. He does report a UTI ~18 months ago that resolved after a course of PO abx. Denies interval UTIs, dysuria or hematuria. He does have a history of BPH with LUTS and takes tamsulosin daily.   He had a scrotal ultrasound on 05/13/2017 that demonstrated bilateral hydroceles with no testicular lesions or signs of epididymorchitis.  The patient has been consented for the above procedures, voices understanding and wishes to proceed.  Description of procedure:  After informed consent was obtained, the patient was brought to the operating room and general LMA anesthesia was administered.  The patient was placed in the supine position and prepped and draped in usual sterile fashion.  A timeout was then performed.    A 4 cm vertical incision was then made along the median raphae in the midportion of the scrotum.  The cremasteric and dartos fibers overlying the right testicle were incised using electrocautery.  The  right hydrocele sac was then expressed through the scrotal incision.  The right hydrocele sac was then sharply incised, draining approximately 50 mL of clear fluid.  The right testicle was inspected and was found to have no masses or lesions.  The redundant tunica vaginalis was then excised and sent to pathology for permanent section.  The remaining tunica vaginalis was then reapproximated using the Lord technique.  He also was found to have a large right sided cord lipoma that was running along the posterior/inferior aspects of the spermatic cord.  This cord lipoma was excised as proximally as possible and sent to pathology for permanent section, as well.  The left testicle was then delivered through the scrotal incision in a similar fashion.  The left sided hydrocele was incised sharply and drained approximately 50 mL of clear fluid.  The left testicle had no signs of masses or lesions.  The redundant tunica vaginalis was then excised and the remaining tunica vaginalis was reapproximated using the Lord technique.  He was also found to have a large cord lipoma running along the posterior/inferior aspects of the left spermatic cord, which was excised in a similar fashion.  The scrotal incision was then closed in 2 layers and dressed with Dermabond.  Scrotal fluffs and a jockstrap were then applied once the Dermabond dried.  The patient tolerated the procedure well and was transferred to the postanesthesia unit in stable condition.  Plan: Follow-up in 6 weeks for a wound check

## 2017-06-02 NOTE — Transfer of Care (Signed)
Immediate Anesthesia Transfer of Care Note  Patient: HOKE BAER  Procedure(s) Performed: HYDROCELECTOMY ADULT (Bilateral )  Patient Location: PACU  Anesthesia Type:General  Level of Consciousness: awake, alert  and oriented  Airway & Oxygen Therapy: Patient Spontanous Breathing and Patient connected to face mask oxygen  Post-op Assessment: Report given to RN and Post -op Vital signs reviewed and stable  Post vital signs: Reviewed and stable  Last Vitals:  Vitals:   06/02/17 0854  BP: (!) 140/92  Pulse: 88  Resp: 18  Temp: 36.5 C  SpO2: 95%    Last Pain:  Vitals:   06/02/17 0854  TempSrc: Oral      Patients Stated Pain Goal: 4 (84/[id number]/59 [id number])  Complications: No apparent anesthesia complications

## 2017-07-02 ENCOUNTER — Other Ambulatory Visit: Payer: Self-pay | Admitting: Cardiology

## 2017-08-25 ENCOUNTER — Other Ambulatory Visit: Payer: Self-pay | Admitting: Cardiology

## 2017-09-21 ENCOUNTER — Other Ambulatory Visit (HOSPITAL_COMMUNITY): Payer: Self-pay | Admitting: Pulmonary Disease

## 2017-09-21 DIAGNOSIS — N5089 Other specified disorders of the male genital organs: Secondary | ICD-10-CM

## 2017-09-27 ENCOUNTER — Ambulatory Visit (HOSPITAL_COMMUNITY)
Admission: RE | Admit: 2017-09-27 | Discharge: 2017-09-27 | Disposition: A | Discharge status: Home / Self Care | Payer: BC Managed Care – PPO | Source: Ambulatory Visit | Attending: Pulmonary Disease | Admitting: Pulmonary Disease

## 2017-09-27 DIAGNOSIS — Z9889 Other specified postprocedural states: Secondary | ICD-10-CM | POA: Insufficient documentation

## 2017-09-27 DIAGNOSIS — N5089 Other specified disorders of the male genital organs: Secondary | ICD-10-CM | POA: Diagnosis not present

## 2017-09-29 ENCOUNTER — Telehealth: Payer: Self-pay | Admitting: Cardiology

## 2017-09-29 ENCOUNTER — Ambulatory Visit (INDEPENDENT_AMBULATORY_CARE_PROVIDER_SITE_OTHER): Payer: BC Managed Care – PPO | Admitting: Orthopaedic Surgery

## 2017-09-29 ENCOUNTER — Encounter: Payer: Self-pay | Admitting: Orthopaedic Surgery

## 2017-09-29 ENCOUNTER — Telehealth: Payer: Self-pay | Admitting: Orthopaedic Surgery

## 2017-09-29 ENCOUNTER — Telehealth: Payer: Self-pay | Admitting: Radiology

## 2017-09-29 VITALS — BP 141/79 | HR 101 | Ht 69.0 in | Wt 233.0 lb

## 2017-09-29 DIAGNOSIS — S86001A Unspecified injury of right Achilles tendon, initial encounter: Secondary | ICD-10-CM | POA: Diagnosis not present

## 2017-09-29 NOTE — Telephone Encounter (Signed)
Patient informed. 

## 2017-09-29 NOTE — Telephone Encounter (Signed)
Patient called stating he had missed a call from the hospital or so he thought, but he was wondering if his MRI was scheduled. I didn't see anything, so I told him I would get you to give him a call.  Please call and advise

## 2017-09-29 NOTE — Progress Notes (Signed)
Subjective:    Patient ID: [name] [name], male    DOB: 1955-10-02, 62 y.o.   MRN: [id number]  HPI [place] the beach [place] walking on the packed sand and felt pain in the right lower leg over the Achilles area.  It was swollen and hurt.  He hobbled back to his room.  He used ice and elevation.  He had some slight bruising.  He resumed normal activities in several days.  Then last week while walking in [place] he suddenly felt a pop and had marked pain in the same area.  He could not walk.  He had to be helped.  He went home, used ice and elevated his leg and got better but he still has tenderness in the area and slight swelling.  He has no redness.  He has no other areas of pain.  He saw Dr. Luan Pulling on 09-22-17 and was referred here.  I have reviewed Dr. Luan Pulling' notes.   Review of Systems  Constitutional: Positive for activity change.  Musculoskeletal: Positive for arthralgias and gait problem.  Psychiatric/Behavioral: The patient is nervous/anxious.    For Review of Systems, all other systems reviewed and are negative.  Past Medical History:  Diagnosis Date  . Anxiety   . Coronary atherosclerosis of native coronary artery    DES distal circumflex 2005, DES LAD/diagonal bifurcation 7/12  . Diabetes mellitus   . Dyspnea    WITH EXERTION  . Essential hypertension, benign   . GERD (gastroesophageal reflux disease)   . History of kidney stones    PASSED ON OWN  . Mixed hyperlipidemia   . Myocardial infarction ([place])    Anterolateral with VF arrest 7/12  . Type 2 diabetes mellitus ([place])    X 30 YRS    Past Surgical History:  Procedure Laterality Date  . CARDIAC CATHETERIZATION  2012  . CAROTID STENT     stents x 2   . CHOLECYSTECTOMY N/A 12/04/2015   Procedure: LAPAROSCOPIC CHOLECYSTECTOMY;  Surgeon: Aviva Signs, MD;  Location: [facility];  Service: General;  Laterality: N/A;  . CHOLECYSTECTOMY, LAPAROSCOPIC  12/05/2015  . CORONARY ANGIOPLASTY  2012   STENT X 1 2012,  STENT X 1 YRS BEFORE  . HYDROCELE EXCISION Bilateral 06/02/2017   Procedure: HYDROCELECTOMY ADULT;  Surgeon: Ceasar Mons, MD;  Location: [facility];  Service: Urology;  Laterality: Bilateral;  ONLY NEEDS 45 MIN  . INGUINAL HERNIA REPAIR     RIGHT GROIN  . TRIGGER FINGER RELEASE Right 01/08/2015   Procedure: RELEASE TRIGGER FINGER/A-1 PULLEY RIGHT MIDDLE FINGER;  Surgeon: Daryll Brod, MD;  Location: [place];  Service: Orthopedics;  Laterality: Right;    Current Outpatient Medications on File Prior to Visit  Medication Sig Dispense Refill  . aspirin 81 MG tablet Take 81 mg by mouth daily.      Marland Kitchen atorvastatin (LIPITOR) 10 MG tablet TAKE (1) TABLET BY MOUTH ONCE DAILY. 30 tablet 11  . carvedilol (COREG) 12.5 MG tablet TAKE (1) TABLET TWICE DAILY. (Patient taking differently: Take 12.5 mg by mouth twice daily) 60 tablet 11  . CINNAMON PO Take 1,000 mg by mouth 2 (two) times daily.     Marland Kitchen CRANBERRY PO Take 4,200 mg by mouth 2 (two) times daily.     . Cranberry POWD Take by mouth.    . insulin glargine (LANTUS) 100 UNIT/ML injection Inject 40-70 Units into the skin See admin instructions. Inject 40 units SQ in the morning  and 70 units SQ at bedtime    . Liraglutide (VICTOZA) 18 MG/3ML SOLN Inject 1.8 mg into the skin daily.      Marland Kitchen lisinopril (PRINIVIL,ZESTRIL) 40 MG tablet     . loratadine (CLARITIN) 10 MG tablet Take by mouth.    . metFORMIN (GLUCOPHAGE) 1000 MG tablet Take 1,000 mg by mouth 2 (two) times daily with a meal.     . NOVOLOG FLEXPEN 100 UNIT/ML FlexPen Inject 15 Units into the skin 3 (three) times daily with meals.     . pantoprazole (PROTONIX) 40 MG tablet Take 40 mg by mouth daily as needed (for acid reflux).    . prasugrel (EFFIENT) 10 MG TABS tablet Take 10 mg by mouth daily    . tamsulosin (FLOMAX) 0.4 MG CAPS capsule Take 0.4 mg by mouth at bedtime.     . fexofenadine-pseudoephedrine (ALLEGRA-D 24) 180-240 MG 24 hr tablet Take 1 tablet by mouth daily.      Marland Kitchen HYDROcodone-acetaminophen (NORCO) 5-325 MG tablet Take 1 tablet by mouth every 4 (four) hours as needed for moderate pain. (Patient not taking: Reported on 09/29/2017) 20 tablet 0  . ibuprofen (ADVIL,MOTRIN) 200 MG tablet Take 600 mg by mouth every 6 (six) hours as needed for headache or moderate pain.     . nitroGLYCERIN (NITROSTAT) 0.4 MG SL tablet Place 1 tablet (0.4 mg total) under the tongue every 5 (five) minutes as needed for chest pain. (Patient not taking: Reported on 09/29/2017) 25 tablet 3  . ondansetron (ZOFRAN) 4 MG tablet Take 1 tablet (4 mg total) by mouth daily as needed for nausea or vomiting. (Patient not taking: Reported on 09/29/2017) 30 tablet 1  . triamcinolone (NASACORT ALLERGY 24HR) 55 MCG/ACT AERO nasal inhaler Place 2 sprays into the nose daily as needed (for allergies).    . zolpidem (AMBIEN) 10 MG tablet Take by mouth.     No current facility-administered medications on file prior to visit.     Social History   Socioeconomic History  . Marital status: Married    Spouse name: Not on file  . Number of children: Not on file  . Years of education: Not on file  . Highest education level: Not on file  Occupational History  . Occupation: Respiratory Therapy    Employer: [employer]  Social Needs  . Financial resource strain: Not on file  . Food insecurity:    Worry: Not on file    Inability: Not on file  . Transportation needs:    Medical: Not on file    Non-medical: Not on file  Tobacco Use  . Smoking status: Never Smoker  . Smokeless tobacco: Never Used  Substance and Sexual Activity  . Alcohol use: No  . Drug use: No  . Sexual activity: Yes  Lifestyle  . Physical activity:    Days per week: Not on file    Minutes per session: Not on file  . Stress: Not on file  Relationships  . Social connections:    Talks on phone: Not on file    Gets together: Not on file    Attends religious service: Not on file    Active member of club or organization: Not on  file    Attends meetings of clubs or organizations: Not on file    Relationship status: Not on file  . Intimate partner violence:    Fear of current or ex partner: Not on file    Emotionally abused: Not on file  Physically abused: Not on file    Forced sexual activity: Not on file  Other Topics Concern  . Not on file  Social History Narrative  . Not on file    Family History  Problem Relation Age of Onset  . Hyperlipidemia Mother   . Hypertension Mother   . Hyperlipidemia Father   . Hypertension Father   . Hypertension Unknown     BP (!) 141/79   Pulse (!) 101   Ht 5\' 9"  (1.753 m)   Wt 233 lb (105.7 kg)   BMI 34.41 kg/m   Body mass index is 34.41 kg/m.      Objective:   Physical Exam  Constitutional: He is oriented to person, place, and time. He appears well-developed and well-nourished.  HENT:  Head: Normocephalic and atraumatic.  Eyes: Pupils are equal, round, and reactive to light. Conjunctivae and EOM are normal.  Neck: Normal range of motion. Neck supple.  Cardiovascular: Normal rate, regular rhythm and intact distal pulses.  Pulmonary/Chest: Effort normal.  Abdominal: Soft.  Musculoskeletal:       Feet:  Neurological: He is alert and oriented to person, place, and time. He has normal reflexes. He displays normal reflexes. No cranial nerve deficit. He exhibits normal muscle tone. Coordination normal.  Skin: Skin is warm and dry.  Psychiatric: He has a normal mood and affect. His behavior is normal. Judgment and thought content normal.          Assessment & Plan:   Encounter Diagnosis  Name Primary?  . Achilles tendon injury, right, initial encounter Yes   I would like to get a MRI of the right Achilles.  I am concerned about a partial tear or a rupture of the popliteus tendon.  He is to use a crutch or walking stick.  Return after the MRI.  Electronically Signed Sanjuana Kava, MD 7/3/20192:14 PM

## 2017-09-29 NOTE — Telephone Encounter (Signed)
Called patient [name] [name] July Wed 10th 1200 arrive at [facility]

## 2017-09-29 NOTE — Telephone Encounter (Signed)
Patient walk in  Need to have an MRI and PCP told him to find out if he can have done since he has had two stints

## 2017-09-29 NOTE — Telephone Encounter (Signed)
No indication for why he cannot have an MRI by review of his chart. No history of ICD or PPM placement. Can proceed with MRI from a cardiac perspective.   Signed, Erma Heritage, PA-C 09/29/2017, 2:36 PM Pager: [phone]

## 2017-10-06 ENCOUNTER — Ambulatory Visit (HOSPITAL_COMMUNITY)
Admission: RE | Admit: 2017-10-06 | Discharge: 2017-10-06 | Disposition: A | Discharge status: Home / Self Care | Payer: BC Managed Care – PPO | Source: Ambulatory Visit | Attending: Orthopaedic Surgery | Admitting: Orthopaedic Surgery

## 2017-10-06 DIAGNOSIS — M19071 Primary osteoarthritis, right ankle and foot: Secondary | ICD-10-CM | POA: Diagnosis not present

## 2017-10-06 DIAGNOSIS — X58XXXA Exposure to other specified factors, initial encounter: Secondary | ICD-10-CM | POA: Insufficient documentation

## 2017-10-06 DIAGNOSIS — S86001A Unspecified injury of right Achilles tendon, initial encounter: Secondary | ICD-10-CM | POA: Diagnosis present

## 2017-10-07 ENCOUNTER — Encounter: Payer: Self-pay | Admitting: Orthopaedic Surgery

## 2017-10-07 ENCOUNTER — Ambulatory Visit (INDEPENDENT_AMBULATORY_CARE_PROVIDER_SITE_OTHER): Payer: BC Managed Care – PPO | Admitting: Orthopaedic Surgery

## 2017-10-07 VITALS — BP 124/75 | HR 85 | Temp 97.5°F | Ht 68.0 in | Wt 232.0 lb

## 2017-10-07 DIAGNOSIS — S86001A Unspecified injury of right Achilles tendon, initial encounter: Secondary | ICD-10-CM

## 2017-10-07 NOTE — Progress Notes (Signed)
Patient [name] [name], male DOB:1956/02/28, 62 y.o. TIW:[id number]  Chief Complaint  Patient presents with  . Ankle Injury    right achilles  . Results    HPI  [name] [name] is a 62 y.o. male who has an Achilles tendon problem on the right.  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 No full-thickness tear. Secondary edema in Kager's fat is noted.  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  I have explained the findings to him.  I have recommended a CAM walker which was applied.  I have explained how to use.  He will need this for a month.   Body mass index is 35.28 kg/m.  ROS  Review of Systems  All other systems reviewed and are negative.  Past Medical History:  Diagnosis Date  . Anxiety   . Coronary atherosclerosis of native coronary artery    DES distal circumflex 2005, DES LAD/diagonal bifurcation 7/12  . Diabetes mellitus   . Dyspnea    WITH EXERTION  . Essential hypertension, benign   . GERD (gastroesophageal reflux disease)   . History of kidney stones    PASSED ON OWN  . Mixed hyperlipidemia   . Myocardial infarction ([place])    Anterolateral with VF arrest 7/12  . Type 2 diabetes mellitus ([place])    X 30 YRS    Past Surgical History:  Procedure Laterality Date  . CARDIAC CATHETERIZATION  2012  . CAROTID STENT     stents x 2   . CHOLECYSTECTOMY N/A 12/04/2015   Procedure: LAPAROSCOPIC CHOLECYSTECTOMY;  Surgeon: Aviva Signs, MD;  Location: [facility];  Service: General;  Laterality: N/A;  . CHOLECYSTECTOMY, LAPAROSCOPIC  12/05/2015  . CORONARY ANGIOPLASTY  2012   STENT X 1 2012, STENT X 1 YRS BEFORE  . HYDROCELE EXCISION Bilateral 06/02/2017   Procedure: HYDROCELECTOMY ADULT;  Surgeon: Ceasar Mons, MD;  Location[facility];  Service: Urology;  Laterality: Bilateral;  ONLY  NEEDS 45 MIN  . INGUINAL HERNIA REPAIR     RIGHT GROIN  . TRIGGER FINGER RELEASE Right 01/08/2015   Procedure: RELEASE TRIGGER FINGER/A-1 PULLEY RIGHT MIDDLE FINGER;  Surgeon: Daryll Brod, MD;  Location: [place] [facility];  Service: Orthopedics;  Laterality: Right;    Family History  Problem Relation Age of Onset  . Hyperlipidemia Mother   . Hypertension Mother   . Hyperlipidemia Father   . Hypertension Father   . Hypertension Unknown     Social History Social History   Tobacco Use  . Smoking status: Never Smoker  . Smokeless tobacco: Never Used  Substance Use Topics  . Alcohol use: No  . Drug use: No    No Known Allergies  Current Outpatient Medications  Medication Sig Dispense Refill  . aspirin 81 MG tablet Take 81 mg by mouth daily.      Marland Kitchen atorvastatin (LIPITOR) 10 MG tablet TAKE (1) TABLET BY MOUTH ONCE DAILY. 30 tablet 11  . carvedilol (COREG) 12.5 MG tablet TAKE (1) TABLET TWICE DAILY. (Patient taking differently: Take 12.5 mg by mouth twice daily) 60 tablet 11  . CINNAMON PO Take 1,000 mg by mouth 2 (two) times daily.     Marland Kitchen CRANBERRY PO Take 4,200 mg by mouth 2 (two) times daily.     . Cranberry POWD Take by mouth.    . fexofenadine-pseudoephedrine (ALLEGRA-D 24) 180-240 MG 24  hr tablet Take 1 tablet by mouth daily.    Marland Kitchen ibuprofen (ADVIL,MOTRIN) 200 MG tablet Take 600 mg by mouth every 6 (six) hours as needed for headache or moderate pain.     Marland Kitchen insulin glargine (LANTUS) 100 UNIT/ML injection Inject 40-70 Units into the skin See admin instructions. Inject 40 units SQ in the morning and 70 units SQ at bedtime    . Liraglutide (VICTOZA) 18 MG/3ML SOLN Inject 1.8 mg into the skin daily.      Marland Kitchen lisinopril (PRINIVIL,ZESTRIL) 40 MG tablet     . loratadine (CLARITIN) 10 MG tablet Take by mouth.    . metFORMIN (GLUCOPHAGE) 1000 MG tablet Take 1,000 mg by mouth 2 (two) times daily with a meal.     . NOVOLOG FLEXPEN 100 UNIT/ML FlexPen Inject 15 Units into the skin 3  (three) times daily with meals.     . pantoprazole (PROTONIX) 40 MG tablet Take 40 mg by mouth daily as needed (for acid reflux).    . prasugrel (EFFIENT) 10 MG TABS tablet Take 10 mg by mouth daily    . tamsulosin (FLOMAX) 0.4 MG CAPS capsule Take 0.4 mg by mouth at bedtime.     . triamcinolone (NASACORT ALLERGY 24HR) 55 MCG/ACT AERO nasal inhaler Place 2 sprays into the nose daily as needed (for allergies).    . zolpidem (AMBIEN) 10 MG tablet Take by mouth.    Marland Kitchen HYDROcodone-acetaminophen (NORCO) 5-325 MG tablet Take 1 tablet by mouth every 4 (four) hours as needed for moderate pain. (Patient not taking: Reported on 09/29/2017) 20 tablet 0  . nitroGLYCERIN (NITROSTAT) 0.4 MG SL tablet Place 1 tablet (0.4 mg total) under the tongue every 5 (five) minutes as needed for chest pain. (Patient not taking: Reported on 09/29/2017) 25 tablet 3  . ondansetron (ZOFRAN) 4 MG tablet Take 1 tablet (4 mg total) by mouth daily as needed for nausea or vomiting. (Patient not taking: Reported on 09/29/2017) 30 tablet 1   No current facility-administered medications for this visit.      Physical Exam  Blood pressure 124/75, pulse 85, temperature (!) 97.5 F (36.4 C), height 5\' 8"  (1.727 m), weight 232 lb (105.2 kg).  Constitutional: overall normal hygiene, normal nutrition, well developed, normal grooming, normal body habitus. Assistive device:none  Musculoskeletal: gait and station Limp right, muscle tone and strength are normal, no tremors or atrophy is present.  .  Neurological: coordination overall normal.  Deep tendon reflex/nerve stretch intact.  Sensation normal.  Cranial nerves II-XII intact.   Skin:   Normal overall no scars, lesions, ulcers or rashes. No psoriasis.  Psychiatric: Alert and oriented x 3.  Recent memory intact, remote memory unclear.  Normal mood and affect. Well groomed.  Good eye contact.  Cardiovascular: overall no swelling, no varicosities, no edema bilaterally, normal temperatures  of the legs and arms, no clubbing, cyanosis and good capillary refill.  Lymphatic: palpation is normal.  He has medial tenderness of the right Achilles tendon distally.  He has no defect, no redness, no swelling.  NV intact.  Limp to the right.  All other systems reviewed and are negative   The patient has been educated about the nature of the problem(s) and counseled on treatment options.  The patient appeared to understand what I have discussed and is in agreement with it.  Encounter Diagnosis  Name Primary?  . Achilles tendon injury, right, initial encounter Yes    PLAN Call if any problems.  Precautions discussed.  Continue current medications.   Return to clinic 1 month   Electronically Signed Sanjuana Kava, MD 7/11/20198:23 AM

## 2017-11-04 ENCOUNTER — Ambulatory Visit (INDEPENDENT_AMBULATORY_CARE_PROVIDER_SITE_OTHER): Payer: BC Managed Care – PPO | Admitting: Orthopaedic Surgery

## 2017-11-04 ENCOUNTER — Encounter: Payer: Self-pay | Admitting: Orthopaedic Surgery

## 2017-11-04 VITALS — BP 176/95 | HR 93 | Ht 68.0 in | Wt 232.0 lb

## 2017-11-04 DIAGNOSIS — S86001D Unspecified injury of right Achilles tendon, subsequent encounter: Secondary | ICD-10-CM

## 2017-11-04 DIAGNOSIS — S86001A Unspecified injury of right Achilles tendon, initial encounter: Secondary | ICD-10-CM

## 2017-11-04 NOTE — Progress Notes (Signed)
Patient [name] [name], male DOB:30-Nov-1955, 62 y.o. LZJ:[id number]  Chief Complaint  Patient presents with  . Ankle Pain    right achilles    HPI  [name] [name] is a 62 y.o. male who has had an injury to the Achilles tendon on the right.  He has interstitial tearing by the MRI exam.  He has been wearing a CAM walker and doing well with it. He has no pain, just tenderness now.  He has no redness or swelling.    I have told him he can come out of the CAM walker at home but wear it out in public.  Then he can slowly wean from using it.    Body mass index is 35.28 kg/m.  ROS  Review of Systems  Constitutional: Positive for activity change.  Musculoskeletal: Positive for arthralgias and gait problem.  Psychiatric/Behavioral: The patient is nervous/anxious.   All other systems reviewed and are negative.   All other systems reviewed and are negative.  Past Medical History:  Diagnosis Date  . Anxiety   . Coronary atherosclerosis of native coronary artery    DES distal circumflex 2005, DES LAD/diagonal bifurcation 7/12  . Diabetes mellitus   . Dyspnea    WITH EXERTION  . Essential hypertension, benign   . GERD (gastroesophageal reflux disease)   . History of kidney stones    PASSED ON OWN  . Mixed hyperlipidemia   . Myocardial infarction ([place])    Anterolateral with VF arrest 7/12  . Type 2 diabetes mellitus ([place])    X 30 YRS    Past Surgical History:  Procedure Laterality Date  . CARDIAC CATHETERIZATION  2012  . CAROTID STENT     stents x 2   . CHOLECYSTECTOMY N/A 12/04/2015   Procedure: LAPAROSCOPIC CHOLECYSTECTOMY;  Surgeon: Aviva Signs, MD;  Location: [facility];  Service: General;  Laterality: N/A;  . CHOLECYSTECTOMY, LAPAROSCOPIC  12/05/2015  . CORONARY ANGIOPLASTY  2012   STENT X 1 2012, STENT X 1 YRS BEFORE  . HYDROCELE EXCISION Bilateral 06/02/2017   Procedure: HYDROCELECTOMY ADULT;  Surgeon: Ceasar Mons, MD;  Location[facility];  Service: Urology;   Laterality: Bilateral;  ONLY NEEDS 45 MIN  . INGUINAL HERNIA REPAIR     RIGHT GROIN  . TRIGGER FINGER RELEASE Right 01/08/2015   Procedure: RELEASE TRIGGER FINGER/A-1 PULLEY RIGHT MIDDLE FINGER;  Surgeon: Daryll Brod, MD;  Location: [place];  Service: Orthopedics;  Laterality: Right;    Family History  Problem Relation Age of Onset  . Hyperlipidemia Mother   . Hypertension Mother   . Hyperlipidemia Father   . Hypertension Father   . Hypertension Unknown     Social History Social History   Tobacco Use  . Smoking status: Never Smoker  . Smokeless tobacco: Never Used  Substance Use Topics  . Alcohol use: No  . Drug use: No    No Known Allergies  Current Outpatient Medications  Medication Sig Dispense Refill  . aspirin 81 MG tablet Take 81 mg by mouth daily.      Marland Kitchen atorvastatin (LIPITOR) 10 MG tablet TAKE (1) TABLET BY MOUTH ONCE DAILY. 30 tablet 11  . carvedilol (COREG) 12.5 MG tablet TAKE (1) TABLET TWICE DAILY. (Patient taking differently: Take 12.5 mg by mouth twice daily) 60 tablet 11  . CINNAMON PO Take 1,000 mg by mouth 2 (two) times daily.     Marland Kitchen CRANBERRY PO Take 4,200 mg by mouth 2 (two) times daily.     Marland Kitchen  Cranberry POWD Take by mouth.    . fexofenadine-pseudoephedrine (ALLEGRA-D 24) 180-240 MG 24 hr tablet Take 1 tablet by mouth daily.    Marland Kitchen HYDROcodone-acetaminophen (NORCO) 5-325 MG tablet Take 1 tablet by mouth every 4 (four) hours as needed for moderate pain. 20 tablet 0  . ibuprofen (ADVIL,MOTRIN) 200 MG tablet Take 600 mg by mouth every 6 (six) hours as needed for headache or moderate pain.     Marland Kitchen insulin glargine (LANTUS) 100 UNIT/ML injection Inject 40-70 Units into the skin See admin instructions. Inject 40 units SQ in the morning and 70 units SQ at bedtime    . Liraglutide (VICTOZA) 18 MG/3ML SOLN Inject 1.8 mg into the skin daily.      Marland Kitchen lisinopril (PRINIVIL,ZESTRIL) 40 MG tablet     . loratadine (CLARITIN) 10 MG tablet Take by mouth.    .  metFORMIN (GLUCOPHAGE) 1000 MG tablet Take 1,000 mg by mouth 2 (two) times daily with a meal.     . NOVOLOG FLEXPEN 100 UNIT/ML FlexPen Inject 15 Units into the skin 3 (three) times daily with meals.     . pantoprazole (PROTONIX) 40 MG tablet Take 40 mg by mouth daily as needed (for acid reflux).    . prasugrel (EFFIENT) 10 MG TABS tablet Take 10 mg by mouth daily    . tamsulosin (FLOMAX) 0.4 MG CAPS capsule Take 0.4 mg by mouth at bedtime.     . triamcinolone (NASACORT ALLERGY 24HR) 55 MCG/ACT AERO nasal inhaler Place 2 sprays into the nose daily as needed (for allergies).    . zolpidem (AMBIEN) 10 MG tablet Take by mouth.    . nitroGLYCERIN (NITROSTAT) 0.4 MG SL tablet Place 1 tablet (0.4 mg total) under the tongue every 5 (five) minutes as needed for chest pain. (Patient not taking: Reported on 09/29/2017) 25 tablet 3  . ondansetron (ZOFRAN) 4 MG tablet Take 1 tablet (4 mg total) by mouth daily as needed for nausea or vomiting. (Patient not taking: Reported on 09/29/2017) 30 tablet 1   No current facility-administered medications for this visit.      Physical Exam  Blood pressure (!) 176/95, pulse 93, height 5\' 8"  (1.727 m), weight 232 lb (105.2 kg).  Constitutional: overall normal hygiene, normal nutrition, well developed, normal grooming, normal body habitus. Assistive device:CAM walker  Musculoskeletal: gait and station Limp right, muscle tone and strength are normal, no tremors or atrophy is present.  .  Neurological: coordination overall normal.  Deep tendon reflex/nerve stretch intact.  Sensation normal.  Cranial nerves II-XII intact.   Skin:   Normal overall no scars, lesions, ulcers or rashes. No psoriasis.  Psychiatric: Alert and oriented x 3.  Recent memory intact, remote memory unclear.  Normal mood and affect. Well groomed.  Good eye contact.  Cardiovascular: overall no swelling, no varicosities, no edema bilaterally, normal temperatures of the legs and arms, no clubbing,  cyanosis and good capillary refill.  Lymphatic: palpation is normal.  The right Achilles has no defect.  It is just slightly tender at the musculotendinous area. He has no redness.  ROM of the ankle is full.  NV intact.  All other systems reviewed and are negative   The patient has been educated about the nature of the problem(s) and counseled on treatment options.  The patient appeared to understand what I have discussed and is in agreement with it.  Encounter Diagnosis  Name Primary?  . Achilles tendon injury, right, initial encounter Yes    PLAN  Call if any problems.  Precautions discussed.  Continue current medications.   Return to clinic 1 month   Electronically Signed Sanjuana Kava, MD 8/8/20198:17 AM

## 2017-12-02 ENCOUNTER — Ambulatory Visit (INDEPENDENT_AMBULATORY_CARE_PROVIDER_SITE_OTHER): Payer: BC Managed Care – PPO | Admitting: Orthopaedic Surgery

## 2017-12-02 ENCOUNTER — Encounter: Payer: Self-pay | Admitting: Orthopaedic Surgery

## 2017-12-02 VITALS — BP 151/96 | HR 95 | Ht 68.0 in | Wt 242.0 lb

## 2017-12-02 DIAGNOSIS — S86001D Unspecified injury of right Achilles tendon, subsequent encounter: Secondary | ICD-10-CM | POA: Diagnosis not present

## 2017-12-02 NOTE — Progress Notes (Signed)
CC:  My Achilles is not hurting at all now  He has no pain on the right Achilles Tendon.  He has normal gait.  He has no swelling.  NV intact. ROM of the right ankle is full.  He has no defect.  Encounter Diagnosis  Name Primary?  . Injury of right Achilles tendon, subsequent encounter Yes   I will see him as needed.  Call if any problem.  Precautions discussed.   Electronically Signed Sanjuana Kava, MD 9/5/20198:13 AM

## 2018-01-04 ENCOUNTER — Encounter: Payer: Self-pay | Admitting: Orthopaedic Surgery

## 2018-01-04 ENCOUNTER — Ambulatory Visit (INDEPENDENT_AMBULATORY_CARE_PROVIDER_SITE_OTHER): Payer: BC Managed Care – PPO | Admitting: Orthopaedic Surgery

## 2018-01-04 VITALS — BP 164/91 | HR 94 | Ht 68.5 in | Wt 240.0 lb

## 2018-01-04 DIAGNOSIS — S86001D Unspecified injury of right Achilles tendon, subsequent encounter: Secondary | ICD-10-CM

## 2018-01-04 NOTE — Progress Notes (Signed)
Patient [name] [name], male DOB:04-03-1955, 62 y.o. LSL:[id number]  Chief Complaint  Patient presents with  . Foot Pain    right achilles     HPI  [name] [name] is a 62 y.o. male who slipped and fell and hurt his right Achilles tendon again yesterday.  He was getting his trash can from the edge of the road.  The can had fallen over.  He went to pull it up.  His right foot slipped and he went down and his foot landed in a hole.  He has had an Achilles tendon injury on the right earlier this summer.  He had done well.  He was having no problem with it until this new episode.  He has swelling and pain of the Achilles on the right.  He used ice and went back into his CAM walker.  He had no other injury.   Body mass index is 35.96 kg/m.  ROS  Review of Systems  Constitutional: Positive for activity change.  Musculoskeletal: Positive for arthralgias and gait problem.  Psychiatric/Behavioral: The patient is nervous/anxious.   All other systems reviewed and are negative.   All other systems reviewed and are negative.  The following is a summary of the past history medically, past history surgically, known current medicines, social history and family history.  This information is gathered electronically by the computer from prior information and documentation.  I review this each visit and have found including this information at this point in the chart is beneficial and informative.    Past Medical History:  Diagnosis Date  . Anxiety   . Coronary atherosclerosis of native coronary artery    DES distal circumflex 2005, DES LAD/diagonal bifurcation 7/12  . Diabetes mellitus   . Dyspnea    WITH EXERTION  . Essential hypertension, benign   . GERD (gastroesophageal reflux disease)   . History of kidney stones    PASSED ON OWN  . Mixed hyperlipidemia   . Myocardial infarction ([place])    Anterolateral with VF arrest 7/12  . Type 2 diabetes mellitus ([place])    X 30 YRS    Past  Surgical History:  Procedure Laterality Date  . CARDIAC CATHETERIZATION  2012  . CAROTID STENT     stents x 2   . CHOLECYSTECTOMY N/A 12/04/2015   Procedure: LAPAROSCOPIC CHOLECYSTECTOMY;  Surgeon: Aviva Signs, MD;  Location: [facility];  Service: General;  Laterality: N/A;  . CHOLECYSTECTOMY, LAPAROSCOPIC  12/05/2015  . CORONARY ANGIOPLASTY  2012   STENT X 1 2012, STENT X 1 YRS BEFORE  . HYDROCELE EXCISION Bilateral 06/02/2017   Procedure: HYDROCELECTOMY ADULT;  Surgeon: Ceasar Mons, MD;  Location: [facility];  Service: Urology;  Laterality: Bilateral;  ONLY NEEDS 45 MIN  . INGUINAL HERNIA REPAIR     RIGHT GROIN  . TRIGGER FINGER RELEASE Right 01/08/2015   Procedure: RELEASE TRIGGER FINGER/A-1 PULLEY RIGHT MIDDLE FINGER;  Surgeon: Daryll Brod, MD;  Location: [facility];  Service: Orthopedics;  Laterality: Right;    Family History  Problem Relation Age of Onset  . Hyperlipidemia Mother   . Hypertension Mother   . Hyperlipidemia Father   . Hypertension Father   . Hypertension Unknown     Social History Social History   Tobacco Use  . Smoking status: Never Smoker  . Smokeless tobacco: Never Used  Substance Use Topics  . Alcohol use: No  . Drug use: No    No Known Allergies  Current Outpatient  Medications  Medication Sig Dispense Refill  . aspirin 81 MG tablet Take 81 mg by mouth daily.      Marland Kitchen atorvastatin (LIPITOR) 10 MG tablet TAKE (1) TABLET BY MOUTH ONCE DAILY. 30 tablet 11  . carvedilol (COREG) 12.5 MG tablet TAKE (1) TABLET TWICE DAILY. (Patient taking differently: Take 12.5 mg by mouth twice daily) 60 tablet 11  . CINNAMON PO Take 1,000 mg by mouth 2 (two) times daily.     Marland Kitchen CRANBERRY PO Take 4,200 mg by mouth 2 (two) times daily.     . Cranberry POWD Take by mouth.    . fexofenadine-pseudoephedrine (ALLEGRA-D 24) 180-240 MG 24 hr tablet Take 1 tablet by mouth daily.    Marland Kitchen ibuprofen (ADVIL,MOTRIN) 200 MG tablet Take 600 mg by mouth every 6 (six)  hours as needed for headache or moderate pain.     Marland Kitchen insulin glargine (LANTUS) 100 UNIT/ML injection Inject 40-70 Units into the skin See admin instructions. Inject 40 units SQ in the morning and 70 units SQ at bedtime    . Liraglutide (VICTOZA) 18 MG/3ML SOLN Inject 1.8 mg into the skin daily.      Marland Kitchen lisinopril (PRINIVIL,ZESTRIL) 40 MG tablet     . loratadine (CLARITIN) 10 MG tablet Take by mouth.    . metFORMIN (GLUCOPHAGE) 1000 MG tablet Take 1,000 mg by mouth 2 (two) times daily with a meal.     . NOVOLOG FLEXPEN 100 UNIT/ML FlexPen Inject 15 Units into the skin 3 (three) times daily with meals.     . pantoprazole (PROTONIX) 40 MG tablet Take 40 mg by mouth daily as needed (for acid reflux).    . prasugrel (EFFIENT) 10 MG TABS tablet Take 10 mg by mouth daily    . tamsulosin (FLOMAX) 0.4 MG CAPS capsule Take 0.4 mg by mouth at bedtime.     . triamcinolone (NASACORT ALLERGY 24HR) 55 MCG/ACT AERO nasal inhaler Place 2 sprays into the nose daily as needed (for allergies).    . zolpidem (AMBIEN) 10 MG tablet Take by mouth.    Marland Kitchen HYDROcodone-acetaminophen (NORCO) 5-325 MG tablet Take 1 tablet by mouth every 4 (four) hours as needed for moderate pain. (Patient not taking: Reported on 01/04/2018) 20 tablet 0  . nitroGLYCERIN (NITROSTAT) 0.4 MG SL tablet Place 1 tablet (0.4 mg total) under the tongue every 5 (five) minutes as needed for chest pain. (Patient not taking: Reported on 09/29/2017) 25 tablet 3   No current facility-administered medications for this visit.      Physical Exam  Blood pressure (!) 164/91, pulse 94, height 5' 8.5" (1.74 m), weight 240 lb (108.9 kg).  Constitutional: overall normal hygiene, normal nutrition, well developed, normal grooming, normal body habitus. Assistive device:CAM Walker  Musculoskeletal: gait and station Limp right, muscle tone and strength are normal, no tremors or atrophy is present.  .  Neurological: coordination overall normal.  Deep tendon  reflex/nerve stretch intact.  Sensation normal.  Cranial nerves II-XII intact.   Skin:   Normal overall no scars, lesions, ulcers or rashes. No psoriasis.  Psychiatric: Alert and oriented x 3.  Recent memory intact, remote memory unclear.  Normal mood and affect. Well groomed.  Good eye contact.  Cardiovascular: overall no swelling, no varicosities, no edema bilaterally, normal temperatures of the legs and arms, no clubbing, cyanosis and good capillary refill.  Lymphatic: palpation is normal.  Right Achilles is tender distally and there may be a slight defect distally about two inches above the  insertion.  His foot is swollen.  Marcello Moores sign is negative.  He can dorsiflex and plantar flex his foot.  He has no redness.  NV intact.  All other systems reviewed and are negative   The patient has been educated about the nature of the problem(s) and counseled on treatment options.  The patient appeared to understand what I have discussed and is in agreement with it.  Encounter Diagnosis  Name Primary?  . Injury of right Achilles tendon, subsequent encounter Yes    PLAN Call if any problems.  Precautions discussed.  Continue current medications.   Return to clinic 1 week   Continue the CAM walker, ice and elevation.  If he gets worse, he is to call me.  I do not appreciate a full tear today.  He had a partial interstitial tear before seen on the MRI.  Electronically Signed Sanjuana Kava, MD 10/8/20198:55 AM

## 2018-01-11 ENCOUNTER — Encounter: Payer: Self-pay | Admitting: Orthopaedic Surgery

## 2018-01-11 ENCOUNTER — Ambulatory Visit (INDEPENDENT_AMBULATORY_CARE_PROVIDER_SITE_OTHER): Payer: BC Managed Care – PPO | Admitting: Orthopaedic Surgery

## 2018-01-11 VITALS — BP 175/90 | HR 88 | Ht 68.5 in | Wt 240.0 lb

## 2018-01-11 DIAGNOSIS — S86001D Unspecified injury of right Achilles tendon, subsequent encounter: Secondary | ICD-10-CM | POA: Diagnosis not present

## 2018-01-11 NOTE — Patient Instructions (Signed)
Your MRI has been ordered.  We will contact your insurance company for approval. After the authorization is received the MRI and a return appointment will be scheduled with you by phone.

## 2018-01-11 NOTE — Progress Notes (Signed)
Patient MC:NOBSJ PURNELL DAIGLE, male DOB:1955-07-04, 62 y.o. GGE:[id number]  Chief Complaint  Patient presents with  . Foot Pain    right achilles pain     HPI  DONTRELLE MAZON is a 62 y.o. male who has continued pain of the right Achilles.  The pain is worse in fact.  He has worn the CAM walker, has elevated it, has used ice.  I am concerned he had a further tear of the Achilles with this new injury.  I would like to get a MRI of the Achilles again.     Body mass index is 35.96 kg/m.  ROS  Review of Systems  Constitutional: Positive for activity change.  Musculoskeletal: Positive for arthralgias and gait problem.  Psychiatric/Behavioral: The patient is nervous/anxious.   All other systems reviewed and are negative.   All other systems reviewed and are negative.  The following is a summary of the past history medically, past history surgically, known current medicines, social history and family history.  This information is gathered electronically by the computer from prior information and documentation.  I review this each visit and have found including this information at this point in the chart is beneficial and informative.    Past Medical History:  Diagnosis Date  . Anxiety   . Coronary atherosclerosis of native coronary artery    DES distal circumflex 2005, DES LAD/diagonal bifurcation 7/12  . Diabetes mellitus   . Dyspnea    WITH EXERTION  . Essential hypertension, benign   . GERD (gastroesophageal reflux disease)   . History of kidney stones    PASSED ON OWN  . Mixed hyperlipidemia   . Myocardial infarction ([place])    Anterolateral with VF arrest 7/12  . Type 2 diabetes mellitus ([place])    X 30 YRS    Past Surgical History:  Procedure Laterality Date  . CARDIAC CATHETERIZATION  2012  . CAROTID STENT     stents x 2   . CHOLECYSTECTOMY N/A 12/04/2015   Procedure: LAPAROSCOPIC CHOLECYSTECTOMY;  Surgeon: Aviva Signs, MD;  Location: [facility];  Service: General;  Laterality:  N/A;  . CHOLECYSTECTOMY, LAPAROSCOPIC  12/05/2015  . CORONARY ANGIOPLASTY  2012   STENT X 1 2012, STENT X 1 YRS BEFORE  . HYDROCELE EXCISION Bilateral 06/02/2017   Procedure: HYDROCELECTOMY ADULT;  Surgeon: Ceasar Mons, MD;  [facility];  Service: Urology;  Laterality: Bilateral;  ONLY NEEDS 45 MIN  . INGUINAL HERNIA REPAIR     RIGHT GROIN  . TRIGGER FINGER RELEASE Right 01/08/2015   Procedure: RELEASE TRIGGER FINGER/A-1 PULLEY RIGHT MIDDLE FINGER;  Surgeon: Daryll Brod, MD;  Location: [facility];  Service: Orthopedics;  Laterality: Right;    Family History  Problem Relation Age of Onset  . Hyperlipidemia Mother   . Hypertension Mother   . Hyperlipidemia Father   . Hypertension Father   . Hypertension Unknown     Social History Social History   Tobacco Use  . Smoking status: Never Smoker  . Smokeless tobacco: Never Used  Substance Use Topics  . Alcohol use: No  . Drug use: No    No Known Allergies  Current Outpatient Medications  Medication Sig Dispense Refill  . aspirin 81 MG tablet Take 81 mg by mouth daily.      Marland Kitchen atorvastatin (LIPITOR) 10 MG tablet TAKE (1) TABLET BY MOUTH ONCE DAILY. 30 tablet 11  . carvedilol (COREG) 12.5 MG tablet TAKE (1) TABLET TWICE DAILY. (Patient taking differently: Take  12.5 mg by mouth twice daily) 60 tablet 11  . CINNAMON PO Take 1,000 mg by mouth 2 (two) times daily.     Marland Kitchen CRANBERRY PO Take 4,200 mg by mouth 2 (two) times daily.     . Cranberry POWD Take by mouth.    . fexofenadine-pseudoephedrine (ALLEGRA-D 24) 180-240 MG 24 hr tablet Take 1 tablet by mouth daily.    Marland Kitchen ibuprofen (ADVIL,MOTRIN) 200 MG tablet Take 600 mg by mouth every 6 (six) hours as needed for headache or moderate pain.     Marland Kitchen insulin glargine (LANTUS) 100 UNIT/ML injection Inject 40-70 Units into the skin See admin instructions. Inject 40 units SQ in the morning and 70 units SQ at bedtime    . Liraglutide (VICTOZA) 18 MG/3ML SOLN Inject 1.8  mg into the skin daily.      Marland Kitchen lisinopril (PRINIVIL,ZESTRIL) 40 MG tablet     . loratadine (CLARITIN) 10 MG tablet Take by mouth.    . metFORMIN (GLUCOPHAGE) 1000 MG tablet Take 1,000 mg by mouth 2 (two) times daily with a meal.     . NOVOLOG FLEXPEN 100 UNIT/ML FlexPen Inject 15 Units into the skin 3 (three) times daily with meals.     . pantoprazole (PROTONIX) 40 MG tablet Take 40 mg by mouth daily as needed (for acid reflux).    . prasugrel (EFFIENT) 10 MG TABS tablet Take 10 mg by mouth daily    . tamsulosin (FLOMAX) 0.4 MG CAPS capsule Take 0.4 mg by mouth at bedtime.     . triamcinolone (NASACORT ALLERGY 24HR) 55 MCG/ACT AERO nasal inhaler Place 2 sprays into the nose daily as needed (for allergies).    . zolpidem (AMBIEN) 10 MG tablet Take by mouth.    Marland Kitchen HYDROcodone-acetaminophen (NORCO) 5-325 MG tablet Take 1 tablet by mouth every 4 (four) hours as needed for moderate pain. (Patient not taking: Reported on 01/04/2018) 20 tablet 0  . nitroGLYCERIN (NITROSTAT) 0.4 MG SL tablet Place 1 tablet (0.4 mg total) under the tongue every 5 (five) minutes as needed for chest pain. (Patient not taking: Reported on 09/29/2017) 25 tablet 3   No current facility-administered medications for this visit.      Physical Exam  Blood pressure (!) 175/90, pulse 88, height 5' 8.5" (1.74 m), weight 240 lb (108.9 kg).  Constitutional: overall normal hygiene, normal nutrition, well developed, normal grooming, normal body habitus. Assistive device:CAM walker right  Musculoskeletal: gait and station Limp right, muscle tone and strength are normal, no tremors or atrophy is present.  .  Neurological: coordination overall normal.  Deep tendon reflex/nerve stretch intact.  Sensation normal.  Cranial nerves II-XII intact.   Skin:   Normal overall no scars, lesions, ulcers or rashes. No psoriasis.  Psychiatric: Alert and oriented x 3.  Recent memory intact, remote memory unclear.  Normal mood and affect. Well  groomed.  Good eye contact.  Cardiovascular: overall no swelling, no varicosities, no edema bilaterally, normal temperatures of the legs and arms, no clubbing, cyanosis and good capillary refill.  Lymphatic: palpation is normal.  He has more swelling of the Achilles area on the right but no redness.  He has more pain.  He has a small defect but not a complete one of the distal right Achilles.  NV intact.  Thompson sign normal.    All other systems reviewed and are negative   The patient has been educated about the nature of the problem(s) and counseled on treatment options.  The  patient appeared to understand what I have discussed and is in agreement with it.  Encounter Diagnosis  Name Primary?  . Injury of right Achilles tendon, subsequent encounter Yes    PLAN Call if any problems.  Precautions discussed.  Continue current medications.   Return to clinic after MRI of the right Achilles.  I will be out of town next week.  If the tear is worse, I have asked that he see Dr. Aline Brochure in my absence.   Electronically Signed Sanjuana Kava, MD 10/15/201910:00 AM

## 2018-01-17 ENCOUNTER — Telehealth: Payer: Self-pay | Admitting: Radiology

## 2018-01-17 DIAGNOSIS — S86001D Unspecified injury of right Achilles tendon, subsequent encounter: Secondary | ICD-10-CM

## 2018-01-17 NOTE — Telephone Encounter (Signed)
I spoke to patient about the MRI scan, it has not been approved by [facility], I will ck on it tomorrow to see if we can file an appeal and let him know.

## 2018-01-18 NOTE — Telephone Encounter (Signed)
Called aim [phone] filed appeal faxed appeal to [phone]

## 2018-01-20 NOTE — Telephone Encounter (Signed)
I finally got the approval for scan they have reversed the denial, approval number is [id number], have scheduled for Wed 30th, arrive at 930 for 10 am scan.  Left message on home number for him to call me back let me know if this is okay  Also need to make follow up for the next day.  To Dr Daine Gip  There has been a delay, scan was denied, had to file appeal, finally got a reversal on the denial today, scan will be done on Wed.

## 2018-01-20 NOTE — Telephone Encounter (Signed)
I have gotten a fax back that they have received request for appeal, and have sent for physician courtesy review, and will have a response within 7 days.

## 2018-01-26 ENCOUNTER — Ambulatory Visit (HOSPITAL_COMMUNITY)
Admission: RE | Admit: 2018-01-26 | Discharge: 2018-01-26 | Disposition: A | Discharge status: Home / Self Care | Payer: BC Managed Care – PPO | Source: Ambulatory Visit | Attending: Orthopaedic Surgery | Admitting: Orthopaedic Surgery

## 2018-01-26 DIAGNOSIS — X58XXXD Exposure to other specified factors, subsequent encounter: Secondary | ICD-10-CM | POA: Diagnosis not present

## 2018-01-26 DIAGNOSIS — R609 Edema, unspecified: Secondary | ICD-10-CM | POA: Diagnosis not present

## 2018-01-26 DIAGNOSIS — M19071 Primary osteoarthritis, right ankle and foot: Secondary | ICD-10-CM | POA: Diagnosis not present

## 2018-01-26 DIAGNOSIS — S86001D Unspecified injury of right Achilles tendon, subsequent encounter: Secondary | ICD-10-CM | POA: Diagnosis not present

## 2018-01-27 ENCOUNTER — Encounter: Payer: Self-pay | Admitting: Orthopaedic Surgery

## 2018-01-27 ENCOUNTER — Ambulatory Visit (INDEPENDENT_AMBULATORY_CARE_PROVIDER_SITE_OTHER): Payer: BC Managed Care – PPO | Admitting: Orthopaedic Surgery

## 2018-01-27 VITALS — BP 142/86 | HR 96 | Ht 68.5 in | Wt 240.0 lb

## 2018-01-27 DIAGNOSIS — S86001D Unspecified injury of right Achilles tendon, subsequent encounter: Secondary | ICD-10-CM | POA: Diagnosis not present

## 2018-01-27 NOTE — Progress Notes (Signed)
Patient ZO:XWRUE CHRISHAWN [name], male DOB:Jul 29, 1955, 62 y.o. AVW:[id number]  Chief Complaint  Patient presents with  . Ankle Injury    right achilles pain Review MRI     HPI  [name] [name] is a 62 y.o. male who had an injury of the right Achilles earlier in the year and then a new injury just recently.  He had a MRI of the distal Achilles and it showed: IMPRESSION: 1. Progressive extensive non insertional Achilles tendinosis with new small full-thickness tear and associated tendon retraction. There is increased edema surrounding the tendon. 2. Stable midfoot degenerative changes. 3. No acute osseous or ligamentous findings identified.  I have informed him of the findings. I will have him see Dr. Aline Brochure to see if he is a surgical candidate for repair.  He is to wear the CAM walker.   Body mass index is 35.96 kg/m.  ROS  Review of Systems  Constitutional: Positive for activity change.  Musculoskeletal: Positive for arthralgias and gait problem.  Psychiatric/Behavioral: The patient is nervous/anxious.   All other systems reviewed and are negative.   All other systems reviewed and are negative.  The following is a summary of the past history medically, past history surgically, known current medicines, social history and family history.  This information is gathered electronically by the computer from prior information and documentation.  I review this each visit and have found including this information at this point in the chart is beneficial and informative.    Past Medical History:  Diagnosis Date  . Anxiety   . Coronary atherosclerosis of native coronary artery    DES distal circumflex 2005, DES LAD/diagonal bifurcation 7/12  . Diabetes mellitus   . Dyspnea    WITH EXERTION  . Essential hypertension, benign   . GERD (gastroesophageal reflux disease)   . History of kidney stones    PASSED ON OWN  . Mixed hyperlipidemia   . Myocardial infarction ([place])    Anterolateral  with VF arrest 7/12  . Type 2 diabetes mellitus ([place])    X 30 YRS    Past Surgical History:  Procedure Laterality Date  . CARDIAC CATHETERIZATION  2012  . CAROTID STENT     stents x 2   . CHOLECYSTECTOMY N/A 12/04/2015   Procedure: LAPAROSCOPIC CHOLECYSTECTOMY;  Surgeon: Aviva Signs, MD;  Location: [facility];  Service: General;  Laterality: N/A;  . CHOLECYSTECTOMY, LAPAROSCOPIC  12/05/2015  . CORONARY ANGIOPLASTY  2012   STENT X 1 2012, STENT X 1 YRS BEFORE  . HYDROCELE EXCISION Bilateral 06/02/2017   Procedure: HYDROCELECTOMY ADULT;  Surgeon: Ceasar Mons, MD;  [facility];  Service: Urology;  Laterality: Bilateral;  ONLY NEEDS 45 MIN  . INGUINAL HERNIA REPAIR     RIGHT GROIN  . TRIGGER FINGER RELEASE Right 01/08/2015   Procedure: RELEASE TRIGGER FINGER/A-1 PULLEY RIGHT MIDDLE FINGER;  Surgeon: Daryll Brod, MD;  Location: [facility];  Service: Orthopedics;  Laterality: Right;    Family History  Problem Relation Age of Onset  . Hyperlipidemia Mother   . Hypertension Mother   . Hyperlipidemia Father   . Hypertension Father   . Hypertension Unknown     Social History Social History   Tobacco Use  . Smoking status: Never Smoker  . Smokeless tobacco: Never Used  Substance Use Topics  . Alcohol use: No  . Drug use: No    No Known Allergies  Current Outpatient Medications  Medication Sig Dispense Refill  . aspirin 81  MG tablet Take 81 mg by mouth daily.      Marland Kitchen atorvastatin (LIPITOR) 10 MG tablet TAKE (1) TABLET BY MOUTH ONCE DAILY. 30 tablet 11  . carvedilol (COREG) 12.5 MG tablet TAKE (1) TABLET TWICE DAILY. (Patient taking differently: Take 12.5 mg by mouth twice daily) 60 tablet 11  . CINNAMON PO Take 1,000 mg by mouth 2 (two) times daily.     Marland Kitchen CRANBERRY PO Take 4,200 mg by mouth 2 (two) times daily.     . Cranberry POWD Take by mouth.    . fexofenadine-pseudoephedrine (ALLEGRA-D 24) 180-240 MG 24 hr tablet Take 1 tablet by mouth daily.     Marland Kitchen ibuprofen (ADVIL,MOTRIN) 200 MG tablet Take 600 mg by mouth every 6 (six) hours as needed for headache or moderate pain.     Marland Kitchen insulin glargine (LANTUS) 100 UNIT/ML injection Inject 40-70 Units into the skin See admin instructions. Inject 40 units SQ in the morning and 70 units SQ at bedtime    . Liraglutide (VICTOZA) 18 MG/3ML SOLN Inject 1.8 mg into the skin daily.      Marland Kitchen lisinopril (PRINIVIL,ZESTRIL) 40 MG tablet     . loratadine (CLARITIN) 10 MG tablet Take by mouth.    . metFORMIN (GLUCOPHAGE) 1000 MG tablet Take 1,000 mg by mouth 2 (two) times daily with a meal.     . NOVOLOG FLEXPEN 100 UNIT/ML FlexPen Inject 15 Units into the skin 3 (three) times daily with meals.     . pantoprazole (PROTONIX) 40 MG tablet Take 40 mg by mouth daily as needed (for acid reflux).    . prasugrel (EFFIENT) 10 MG TABS tablet Take 10 mg by mouth daily    . tamsulosin (FLOMAX) 0.4 MG CAPS capsule Take 0.4 mg by mouth at bedtime.     . triamcinolone (NASACORT ALLERGY 24HR) 55 MCG/ACT AERO nasal inhaler Place 2 sprays into the nose daily as needed (for allergies).    . zolpidem (AMBIEN) 10 MG tablet Take by mouth.    Marland Kitchen HYDROcodone-acetaminophen (NORCO) 5-325 MG tablet Take 1 tablet by mouth every 4 (four) hours as needed for moderate pain. (Patient not taking: Reported on 01/04/2018) 20 tablet 0  . nitroGLYCERIN (NITROSTAT) 0.4 MG SL tablet Place 1 tablet (0.4 mg total) under the tongue every 5 (five) minutes as needed for chest pain. (Patient not taking: Reported on 09/29/2017) 25 tablet 3   No current facility-administered medications for this visit.      Physical Exam  Blood pressure (!) 142/86, pulse 96, height 5' 8.5" (1.74 m), weight 240 lb (108.9 kg).  Constitutional: overall normal hygiene, normal nutrition, well developed, normal grooming, normal body habitus. Assistive device:CAM walker right  Musculoskeletal: gait and station Limp right, muscle tone and strength are normal, no tremors or atrophy  is present.  .  Neurological: coordination overall normal.  Deep tendon reflex/nerve stretch intact.  Sensation normal.  Cranial nerves II-XII intact.   Skin:   Normal overall no scars, lesions, ulcers or rashes. No psoriasis.  Psychiatric: Alert and oriented x 3.  Recent memory intact, remote memory unclear.  Normal mood and affect. Well groomed.  Good eye contact.  Cardiovascular: overall no swelling, no varicosities, no edema bilaterally, normal temperatures of the legs and arms, no clubbing, cyanosis and good capillary refill.  Lymphatic: palpation is normal.  His right foot and ankle have some swelling.  He has pain and defect of the distal Achilles on the right.  NV intact.  He can  still dorsiflex his foot just slightly.    All other systems reviewed and are negative   The patient has been educated about the nature of the problem(s) and counseled on treatment options.  The patient appeared to understand what I have discussed and is in agreement with it.  Encounter Diagnosis  Name Primary?  . Injury of right Achilles tendon, subsequent encounter Yes    PLAN Call if any problems.  Precautions discussed.  Continue current medications.   Return to clinic to see Dr. Aline Brochure for possible surgery.   Electronically Signed Sanjuana Kava, MD 10/31/20198:24 AM

## 2018-01-28 NOTE — Telephone Encounter (Signed)
-----   Message from Carole Civil, MD sent at 01/27/2018  2:33 PM EDT ----- fri or mon but I m sending him to [facility] he has diabetes and h/o CAD which means wound problem !  ----- Message ----- From: Candice Camp, RT Sent: 01/27/2018   8:17 AM EDT To: Carole Civil, MD  Patient has achilles tear, small full thickness with retraction Dr Luna Glasgow wants you to see him, for surgical evaluation  where can I work him in? Please advise

## 2018-01-28 NOTE — Telephone Encounter (Signed)
Thank you. I called him and he agrees to referral, has been placed.

## 2018-02-08 ENCOUNTER — Ambulatory Visit (INDEPENDENT_AMBULATORY_CARE_PROVIDER_SITE_OTHER): Payer: BC Managed Care – PPO | Admitting: Orthopedic Surgery

## 2018-02-08 ENCOUNTER — Encounter (INDEPENDENT_AMBULATORY_CARE_PROVIDER_SITE_OTHER): Payer: Self-pay | Admitting: Orthopedic Surgery

## 2018-02-08 VITALS — Ht 68.0 in | Wt 240.0 lb

## 2018-02-08 DIAGNOSIS — S86011S Strain of right Achilles tendon, sequela: Secondary | ICD-10-CM | POA: Diagnosis not present

## 2018-02-08 NOTE — Progress Notes (Signed)
Office Visit Note   Patient: [name] [name]           Date of Birth: 23-Dec-1955           MRN: [id number] Visit Date: 02/08/2018              Requested by: Carole Civil, MD [address] [place], [place] [address] PCP: Sinda Du, MD  Chief Complaint  Patient presents with  . Right Foot - Pain    Achilles tendon tear       HPI: Patient is a 62 year old gentleman who was seen for initial evaluation for right Achilles tendon rupture.  He states he ruptured the Achilles tendon in June 1 time and the second time he ruptured it was about a month ago.  He is status post an MRI scan which is reviewed.  Assessment & Plan: Visit Diagnoses:  1. Achilles rupture, right, sequela     Plan: Discussed with the patient options with surgical intervention as well as conservative therapy with 9/16 inch heel lifts and conservative therapy with the fracture boot.  Discussed that with the patient's hemoglobin A1c greater than 9 and venous swelling in his leg he is an increased risk of the wound healing increased risk of infection with surgical intervention.  Patient states he would like to proceed with nonoperative treatment at this time and we will evaluate in 4 weeks the healing process he will wear the boot at all times except for showering.  Follow-Up Instructions: Return in about 4 weeks (around 03/08/2018).   Ortho Exam  Patient is alert, oriented, no adenopathy, well-dressed, normal affect, normal respiratory effort. Examination patient does have venous swelling in the right lower extremity he does not have a palpable dorsalis pedis pulse he has a palpable defect of the Achilles and compression of the calf does not reproduce plantarflexion of the foot.  Review of the MRI scan shows a minimally displaced Achilles tendon rupture.  Imaging: No results found. No images are attached to the encounter.  Labs: Lab Results  Component Value Date   HGBA1C 8.7 (H) 09/30/2010      Lab Results  Component Value Date   ALBUMIN 4.3 11/29/2015   ALBUMIN 4.8 06/08/2014   ALBUMIN 3.9 04/11/2014    Body mass index is 36.49 kg/m.  Orders:  No orders of the defined types were placed in this encounter.  No orders of the defined types were placed in this encounter.    Procedures: No procedures performed  Clinical Data: No additional findings.  ROS:  All other systems negative, except as noted in the HPI. Review of Systems  Objective: Vital Signs: Ht 5\' 8"  (1.727 m)   Wt 240 lb (108.9 kg)   BMI 36.49 kg/m   Specialty Comments:  No specialty comments available.  PMFS History: Patient Active Problem List   Diagnosis Date Noted  . Palpitations 11/26/2010  . Coronary atherosclerosis of native coronary artery 10/23/2010  . Essential hypertension, benign 10/23/2010  . Mixed hyperlipidemia 10/23/2010   Past Medical History:  Diagnosis Date  . Anxiety   . Coronary atherosclerosis of native coronary artery    DES distal circumflex 2005, DES LAD/diagonal bifurcation 7/12  . Diabetes mellitus   . Dyspnea    WITH EXERTION  . Essential hypertension, benign   . GERD (gastroesophageal reflux disease)   . History of kidney stones    PASSED ON OWN  . Mixed hyperlipidemia   . Myocardial infarction ([place])  Anterolateral with VF arrest 7/12  . Type 2 diabetes mellitus (HCC)    X 30 YRS    Family History  Problem Relation Age of Onset  . Hyperlipidemia Mother   . Hypertension Mother   . Hyperlipidemia Father   . Hypertension Father   . Hypertension Unknown     Past Surgical History:  Procedure Laterality Date  . CARDIAC CATHETERIZATION  2012  . CAROTID STENT     stents x 2   . CHOLECYSTECTOMY N/A 12/04/2015   Procedure: LAPAROSCOPIC CHOLECYSTECTOMY;  Surgeon: Aviva Signs, MD;  Location: [facility];  Service: General;  Laterality: N/A;  . CHOLECYSTECTOMY, LAPAROSCOPIC  12/05/2015  . CORONARY ANGIOPLASTY  2012   STENT X 1 2012, STENT X 1 YRS  BEFORE  . HYDROCELE EXCISION Bilateral 06/02/2017   Procedure: HYDROCELECTOMY ADULT;  Surgeon: Ceasar Mons, MD;  Location: [facility];  Service: Urology;  Laterality: Bilateral;  ONLY NEEDS 45 MIN  . INGUINAL HERNIA REPAIR     RIGHT GROIN  . TRIGGER FINGER RELEASE Right 01/08/2015   Procedure: RELEASE TRIGGER FINGER/A-1 PULLEY RIGHT MIDDLE FINGER;  Surgeon: Daryll Brod, MD;  Location: [facility];  Service: Orthopedics;  Laterality: Right;   Social History   Occupational History  . Occupation: Respiratory Therapy    Employer: [employer]  Tobacco Use  . Smoking status: Never Smoker  . Smokeless tobacco: Never Used  Substance and Sexual Activity  . Alcohol use: No  . Drug use: No  . Sexual activity: Yes

## 2018-02-09 ENCOUNTER — Ambulatory Visit: Payer: BC Managed Care – PPO | Admitting: Orthopedic Surgery

## 2018-02-14 ENCOUNTER — Encounter (INDEPENDENT_AMBULATORY_CARE_PROVIDER_SITE_OTHER): Payer: Self-pay | Admitting: Orthopedic Surgery

## 2018-03-03 ENCOUNTER — Other Ambulatory Visit: Payer: Self-pay | Admitting: Cardiology

## 2018-03-08 ENCOUNTER — Ambulatory Visit (INDEPENDENT_AMBULATORY_CARE_PROVIDER_SITE_OTHER): Payer: BC Managed Care – PPO | Admitting: Orthopedic Surgery

## 2018-03-08 ENCOUNTER — Encounter (INDEPENDENT_AMBULATORY_CARE_PROVIDER_SITE_OTHER): Payer: Self-pay | Admitting: Orthopedic Surgery

## 2018-03-08 VITALS — Ht 68.0 in | Wt 240.0 lb

## 2018-03-08 DIAGNOSIS — S86011S Strain of right Achilles tendon, sequela: Secondary | ICD-10-CM | POA: Diagnosis not present

## 2018-03-08 NOTE — Progress Notes (Signed)
Office Visit Note   Patient: [name] [name]           Date of Birth: Mar 04, 1956           MRN: [id number] Visit Date: 03/08/2018              Requested by: Sinda Du, MD [address] [place], [place] [address] PCP: Sinda Du, MD  Chief Complaint  Patient presents with  . Right Foot - Follow-up      HPI: Patient is a 62 year old gentleman who is status post 2 partial tears to his right Achilles tendon.  He states his been ambulating in a fracture boot without symptoms.  He states he feels better than before states he still has some swelling.  Assessment & Plan: Visit Diagnoses:  1. Achilles rupture, right, sequela     Plan: Patient was given two 916 inch inch heel lifts that he will use in his shoes.  Increase his activities as tolerated.  Follow-Up Instructions: Return in about 4 weeks (around 04/05/2018).   Ortho Exam  Patient is alert, oriented, no adenopathy, well-dressed, normal affect, normal respiratory effort. Examination patient has good pulses he does have some venous stasis swelling the tendon is palpated and intact.  Compression of the calf reproduces plantarflexion of the foot.  He is healing well.  We will advance him to shoewear with a heel lift.  He will resume using the fracture boot with the left if he develops symptoms.  Imaging: No results found. No images are attached to the encounter.  Labs: Lab Results  Component Value Date   HGBA1C 8.7 (H) 09/30/2010     Lab Results  Component Value Date   ALBUMIN 4.3 11/29/2015   ALBUMIN 4.8 06/08/2014   ALBUMIN 3.9 04/11/2014    Body mass index is 36.49 kg/m.  Orders:  No orders of the defined types were placed in this encounter.  No orders of the defined types were placed in this encounter.    Procedures: No procedures performed  Clinical Data: No additional findings.  ROS:  All other systems negative, except as noted in the HPI. Review of Systems  Objective: Vital  Signs: Ht 5\' 8"  (1.727 m)   Wt 240 lb (108.9 kg)   BMI 36.49 kg/m   Specialty Comments:  No specialty comments available.  PMFS History: Patient Active Problem List   Diagnosis Date Noted  . Palpitations 11/26/2010  . Coronary atherosclerosis of native coronary artery 10/23/2010  . Essential hypertension, benign 10/23/2010  . Mixed hyperlipidemia 10/23/2010   Past Medical History:  Diagnosis Date  . Anxiety   . Coronary atherosclerosis of native coronary artery    DES distal circumflex 2005, DES LAD/diagonal bifurcation 7/12  . Diabetes mellitus   . Dyspnea    WITH EXERTION  . Essential hypertension, benign   . GERD (gastroesophageal reflux disease)   . History of kidney stones    PASSED ON OWN  . Mixed hyperlipidemia   . Myocardial infarction ([place])    Anterolateral with VF arrest 7/12  . Type 2 diabetes mellitus (HCC)    X 30 YRS    Family History  Problem Relation Age of Onset  . Hyperlipidemia Mother   . Hypertension Mother   . Hyperlipidemia Father   . Hypertension Father   . Hypertension Unknown     Past Surgical History:  Procedure Laterality Date  . CARDIAC CATHETERIZATION  2012  . CAROTID STENT     stents x 2   .  CHOLECYSTECTOMY N/A 12/04/2015   Procedure: LAPAROSCOPIC CHOLECYSTECTOMY;  Surgeon: Aviva Signs, MD;  Location: [facility];  Service: General;  Laterality: N/A;  . CHOLECYSTECTOMY, LAPAROSCOPIC  12/05/2015  . CORONARY ANGIOPLASTY  2012   STENT X 1 2012, STENT X 1 YRS BEFORE  . HYDROCELE EXCISION Bilateral 06/02/2017   Procedure: HYDROCELECTOMY ADULT;  Surgeon: Ceasar Mons, MD;  [facility];  Service: Urology;  Laterality: Bilateral;  ONLY NEEDS 45 MIN  . INGUINAL HERNIA REPAIR     RIGHT GROIN  . TRIGGER FINGER RELEASE Right 01/08/2015   Procedure: RELEASE TRIGGER FINGER/A-1 PULLEY RIGHT MIDDLE FINGER;  Surgeon: Daryll Brod, MD;  Location: [facility];  Service: Orthopedics;  Laterality: Right;   Social History    Occupational History  . Occupation: Respiratory Therapy    Employer: [employer]  Tobacco Use  . Smoking status: Never Smoker  . Smokeless tobacco: Never Used  Substance and Sexual Activity  . Alcohol use: No  . Drug use: No  . Sexual activity: Yes

## 2018-03-29 ENCOUNTER — Other Ambulatory Visit: Payer: Self-pay | Admitting: Cardiology

## 2018-04-05 ENCOUNTER — Ambulatory Visit (INDEPENDENT_AMBULATORY_CARE_PROVIDER_SITE_OTHER): Payer: BC Managed Care – PPO | Admitting: Orthopedic Surgery

## 2018-04-05 ENCOUNTER — Encounter (INDEPENDENT_AMBULATORY_CARE_PROVIDER_SITE_OTHER): Payer: Self-pay | Admitting: Orthopedic Surgery

## 2018-04-05 VITALS — Ht 68.0 in | Wt 240.0 lb

## 2018-04-05 DIAGNOSIS — S86011S Strain of right Achilles tendon, sequela: Secondary | ICD-10-CM

## 2018-04-05 NOTE — Progress Notes (Signed)
Office Visit Note   Patient: [name] [name]           Date of Birth: 10-Aug-1955           MRN: [id number] Visit Date: 04/05/2018              Requested by: Sinda Du, MD [address] [place], [place] [address] PCP: Sinda Du, MD  Chief Complaint  Patient presents with  . Right Foot - Follow-up      HPI: Patient is a 63 year old gentleman who is status post 2 Achilles tendon ruptures on the right.  He has uncontrolled type 2 diabetes as well as venous insufficiency and we have elected to follow this with conservative treatment.  He is currently wearing sneakers with a 9/16 inch heel lift.  He states he still has some limping he has limited pain still has venous stasis swelling he states that he usually wears the medical compression socks.  He has no concerns or issues.  Assessment & Plan: Visit Diagnoses:  1. Achilles rupture, right, sequela     Plan: Patient was given a note that he may resume his temporary work that he can work 2 days a week 2 to 3 hours a day for 4 weeks and then increase to his regular work schedule of 3 days a week 4 hours a day.  Discussed that if there is any significant change we would need to consider surgical intervention.  Follow-Up Instructions: Return in about 4 weeks (around 05/03/2018).   Ortho Exam  Patient is alert, oriented, no adenopathy, well-dressed, normal affect, normal respiratory effort. Examination patient has a palpable dorsalis pedis and posterior tibial pulse he has brawny skin color changes with no venous ulcers there is pitting edema up to the tibial tubercle.  He has a palpable defect where the Achilles is healing but there is plantarflexion the foot with compression of the calf.  Imaging: No results found. No images are attached to the encounter.  Labs: Lab Results  Component Value Date   HGBA1C 8.7 (H) 09/30/2010     Lab Results  Component Value Date   ALBUMIN 4.3 11/29/2015   ALBUMIN 4.8 06/08/2014   ALBUMIN 3.9 04/11/2014    Body mass index is 36.49 kg/m.  Orders:  No orders of the defined types were placed in this encounter.  No orders of the defined types were placed in this encounter.    Procedures: No procedures performed  Clinical Data: No additional findings.  ROS:  All other systems negative, except as noted in the HPI. Review of Systems  Objective: Vital Signs: Ht 5\' 8"  (1.727 m)   Wt 240 lb (108.9 kg)   BMI 36.49 kg/m   Specialty Comments:  No specialty comments available.  PMFS History: Patient Active Problem List   Diagnosis Date Noted  . Palpitations 11/26/2010  . Coronary atherosclerosis of native coronary artery 10/23/2010  . Essential hypertension, benign 10/23/2010  . Mixed hyperlipidemia 10/23/2010   Past Medical History:  Diagnosis Date  . Anxiety   . Coronary atherosclerosis of native coronary artery    DES distal circumflex 2005, DES LAD/diagonal bifurcation 7/12  . Diabetes mellitus   . Dyspnea    WITH EXERTION  . Essential hypertension, benign   . GERD (gastroesophageal reflux disease)   . History of kidney stones    PASSED ON OWN  . Mixed hyperlipidemia   . Myocardial infarction ([place])    Anterolateral with VF arrest 7/12  . Type 2  diabetes mellitus ([facility])    X 30 YRS    Family History  Problem Relation Age of Onset  . Hyperlipidemia Mother   . Hypertension Mother   . Hyperlipidemia Father   . Hypertension Father   . Hypertension Unknown     Past Surgical History:  Procedure Laterality Date  . CARDIAC CATHETERIZATION  2012  . CAROTID STENT     stents x 2   . CHOLECYSTECTOMY N/A 12/04/2015   Procedure: LAPAROSCOPIC CHOLECYSTECTOMY;  Surgeon: Aviva Signs, MD;  Location: [facility];  Service: General;  Laterality: N/A;  . CHOLECYSTECTOMY, LAPAROSCOPIC  12/05/2015  . CORONARY ANGIOPLASTY  2012   STENT X 1 2012, STENT X 1 YRS BEFORE  . HYDROCELE EXCISION Bilateral 06/02/2017   Procedure: HYDROCELECTOMY ADULT;  Surgeon: Ceasar Mons, MD;  [facility];  Service: Urology;  Laterality: Bilateral;  ONLY NEEDS 45 MIN  . INGUINAL HERNIA REPAIR     RIGHT GROIN  . TRIGGER FINGER RELEASE Right 01/08/2015   Procedure: RELEASE TRIGGER FINGER/A-1 PULLEY RIGHT MIDDLE FINGER;  Surgeon: Daryll Brod, MD;  Location: [facility];  Service: Orthopedics;  Laterality: Right;   Social History   Occupational History  . Occupation: Respiratory Therapy    Employer: [employer]  Tobacco Use  . Smoking status: Never Smoker  . Smokeless tobacco: Never Used  Substance and Sexual Activity  . Alcohol use: No  . Drug use: No  . Sexual activity: Yes

## 2018-05-04 ENCOUNTER — Encounter: Payer: Self-pay | Admitting: Cardiology

## 2018-05-04 NOTE — Progress Notes (Signed)
Cardiology Office Note  Date: 05/06/2018   ID: [name], [name] [birth year], MRN [id number]  PCP: Sinda Du, MD  Primary Cardiologist: Rozann Lesches, MD   Chief Complaint  Patient presents with  . Coronary Artery Disease    History of Present Illness: [name] [name] is a 63 y.o. male last seen in January 2019.  He presents for a routine follow-up visit.  He tells me that last year he had a bilateral hydrocelectomy, and later on an injury to his right foot with Achilles tear.  These issues limited him, he gained weight without regular activity and was out of work for a few months.  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  He does not report any active angina at this time, no nitroglycerin use.  We went over his medications which are listed below, he reports no intolerances.  Follow-up Myoview in January 2019 was overall low risk demonstrating a very small ischemic territory with visually normal-appearing LVEF although calculated at 39%.  I talked with him today about setting up a follow-up echocardiogram to reassess LVEF.  I personally reviewed his ECG today which shows normal sinus rhythm.  Past Medical History:  Diagnosis Date  . Anxiety   . Coronary atherosclerosis of native coronary artery    DES distal circumflex 2005, DES LAD/diagonal bifurcation 7/12  . Essential hypertension   . GERD (gastroesophageal reflux disease)   . History of kidney stones   . Mixed hyperlipidemia   . Myocardial infarction ([facility])    Anterolateral with VF arrest 7/12  . Type 2 diabetes mellitus ([facility])     Past Surgical History:  Procedure Laterality Date  . CARDIAC CATHETERIZATION  2012  . CAROTID STENT     stents x 2   . CHOLECYSTECTOMY N/A 12/04/2015   Procedure: LAPAROSCOPIC CHOLECYSTECTOMY;  Surgeon: Aviva Signs, MD;  Location: [facility];  Service: General;  Laterality: N/A;  . CHOLECYSTECTOMY, LAPAROSCOPIC  12/05/2015  . CORONARY  ANGIOPLASTY  2012   STENT X 1 2012, STENT X 1 YRS BEFORE  . HYDROCELE EXCISION Bilateral 06/02/2017   Procedure: HYDROCELECTOMY ADULT;  Surgeon: Ceasar Mons, MD;  [facility];  Service: Urology;  Laterality: Bilateral;  ONLY NEEDS 45 MIN  . INGUINAL HERNIA REPAIR     RIGHT GROIN  . TRIGGER FINGER RELEASE Right 01/08/2015   Procedure: RELEASE TRIGGER FINGER/A-1 PULLEY RIGHT MIDDLE FINGER;  Surgeon: Daryll Brod, MD;  Location: [facility];  Service: Orthopedics;  Laterality: Right;    Current Outpatient Medications  Medication Sig Dispense Refill  . aspirin 81 MG tablet Take 81 mg by mouth daily.      Marland Kitchen atorvastatin (LIPITOR) 10 MG tablet TAKE (1) TABLET BY MOUTH ONCE DAILY. 30 tablet 11  . carvedilol (COREG) 12.5 MG tablet TAKE (1) TABLET TWICE DAILY. 180 tablet 3  . CINNAMON PO Take 1,000 mg by mouth 2 (two) times daily.     Marland Kitchen CRANBERRY PO Take 4,200 mg by mouth 2 (two) times daily.     . Cranberry POWD Take by mouth.    Marland Kitchen ibuprofen (ADVIL,MOTRIN) 200 MG tablet Take 600 mg by mouth every 6 (six) hours as needed for headache or moderate pain.     Marland Kitchen insulin glargine (LANTUS) 100 UNIT/ML injection Inject 40-70 Units into the skin See admin instructions. Inject 40 units SQ in the morning and 70 units SQ at bedtime    .  Liraglutide (VICTOZA) 18 MG/3ML SOLN Inject 1.8 mg into the skin daily.      Marland Kitchen lisinopril (PRINIVIL,ZESTRIL) 40 MG tablet     . loratadine (CLARITIN) 10 MG tablet Take by mouth.    . metFORMIN (GLUCOPHAGE) 1000 MG tablet Take 1,000 mg by mouth 2 (two) times daily with a meal.     . nitroGLYCERIN (NITROSTAT) 0.4 MG SL tablet Place 1 tablet (0.4 mg total) under the tongue every 5 (five) minutes as needed for chest pain. 25 tablet 3  . NOVOLOG FLEXPEN 100 UNIT/ML FlexPen Inject 15 Units into the skin 3 (three) times daily with meals.     . pantoprazole (PROTONIX) 40 MG tablet Take 40 mg by mouth daily as needed (for acid reflux).    . prasugrel  (EFFIENT) 10 MG TABS tablet Take 10 mg by mouth daily    . tamsulosin (FLOMAX) 0.4 MG CAPS capsule Take 0.4 mg by mouth at bedtime.     . triamcinolone (NASACORT ALLERGY 24HR) 55 MCG/ACT AERO nasal inhaler Place 2 sprays into the nose daily as needed (for allergies).    . zolpidem (AMBIEN) 10 MG tablet Take by mouth.     No current facility-administered medications for this visit.    Allergies:  Patient has no known allergies.   Social History: The patient  reports that he has never smoked. He has never used smokeless tobacco. He reports that he does not drink alcohol or use drugs.   ROS:  Please see the history of present illness. Otherwise, complete review of systems is positive for chronic shortness of breath.  All other systems are reviewed and negative.   Physical Exam: VS:  BP (!) 166/92   Pulse 67   Ht 5' 8.5" (1.74 m)   Wt 247 lb (112 kg)   SpO2 97%   BMI 37.01 kg/m , BMI Body mass index is 37.01 kg/m.  Wt Readings from Last 3 Encounters:  05/06/18 247 lb (112 kg)  04/05/18 240 lb (108.9 kg)  03/08/18 240 lb (108.9 kg)    General: Obese male, appears comfortable at rest. HEENT: Conjunctiva and lids normal, oropharynx clear. Neck: Supple, no elevated JVP or carotid bruits, no thyromegaly. Lungs: Clear to auscultation, nonlabored breathing at rest. Cardiac: Regular rate and rhythm, no S3 or significant systolic murmur, no pericardial rub. Abdomen: Soft, nontender, bowel sounds present. Extremities: No pitting edema, distal pulses 2+. Skin: Warm and dry. Musculoskeletal: No kyphosis. Neuropsychiatric: Alert and oriented x3, affect grossly appropriate.  ECG: I personally reviewed the tracing from 1/24/319 which showed sinus rhythm with poor anterior R wave progression, possible old inferior infarct pattern.  Recent Labwork: 05/31/2017: BUN 19; Creatinine, Ser 1.04; Hemoglobin 14.4; Platelets 280; Potassium 4.8; Sodium 139  January 2019: Cholesterol 98, HDL 22,  triglycerides 114, LDL 56  Other Studies Reviewed Today:  Lexiscan Myoview 04/29/2017:  No diagnostic ST segment changes to indicate ischemia. Frequent PVCs.  Minor, mild intensity, apical inferior defect that is partially reversible and consistent with a very small ischemic territory.  This is a low risk study based on perfusion imaging.  Nuclear stress EF: 39%. Visually LVEF looks to be normal - suggest echocardiogram to confirm.  Assessment and Plan:  1.  CAD status post DES to the circumflex in 2005 and DES to the LAD/diagonal in 2012.  Lexiscan Myoview from last year demonstrated no significant degree of ischemia.  We do plan a follow-up echocardiogram to reassess LVEF and determine if any medication adjustments need to  be made.  He otherwise reports compliance with therapy and no active angina symptoms at this time.  He has remained on dual antiplatelet therapy.  2.  Mixed hyperlipidemia, he remains on Lipitor.  Last LDL 56.  He continues to follow with Dr. Luan Pulling.  3.  Essential hypertension, blood pressure elevated today.  We discussed weight loss and diet.  He continues on stable regimen.  4.  Type 2 diabetes mellitus, follows with Dr. Luan Pulling and continues on insulin.  Current medicines were reviewed with the patient today.   Orders Placed This Encounter  Procedures  . EKG 12-Lead  . ECHOCARDIOGRAM COMPLETE    Disposition: Call with test results and determine follow-up plan.  Signed, Satira Sark, MD, [facility] 05/06/2018 1:24 PM    [facility] [address], [place], [place] [address] Phone: [phone]; Fax: [phone]

## 2018-05-05 ENCOUNTER — Other Ambulatory Visit: Payer: Self-pay | Admitting: Cardiology

## 2018-05-06 ENCOUNTER — Ambulatory Visit: Payer: BC Managed Care – PPO | Admitting: Cardiology

## 2018-05-06 ENCOUNTER — Encounter: Payer: Self-pay | Admitting: Cardiology

## 2018-05-06 VITALS — BP 166/92 | HR 67 | Ht 68.5 in | Wt 247.0 lb

## 2018-05-06 DIAGNOSIS — I25119 Atherosclerotic heart disease of native coronary artery with unspecified angina pectoris: Secondary | ICD-10-CM | POA: Diagnosis not present

## 2018-05-06 DIAGNOSIS — E782 Mixed hyperlipidemia: Secondary | ICD-10-CM | POA: Diagnosis not present

## 2018-05-06 DIAGNOSIS — E1165 Type 2 diabetes mellitus with hyperglycemia: Secondary | ICD-10-CM

## 2018-05-06 DIAGNOSIS — I10 Essential (primary) hypertension: Secondary | ICD-10-CM | POA: Diagnosis not present

## 2018-05-06 NOTE — Patient Instructions (Signed)
Medication Instructions:  Your physician recommends that you continue on your current medications as directed. Please refer to the Current Medication list given to you today.  If you need a refill on your cardiac medications before your next appointment, please call your pharmacy.   Lab work: None today If you have labs (blood work) drawn today and your tests are completely normal, you will receive your results only by: Marland Kitchen MyChart Message (if you have MyChart) OR . A paper copy in the mail If you have any lab test that is abnormal or we need to change your treatment, we will call you to review the results.  Testing/Procedures: Your physician has requested that you have an echocardiogram. Echocardiography is a painless test that uses sound waves to create images of your heart. It provides your doctor with information about the size and shape of your heart and how well your heart's chambers and valves are working. This procedure takes approximately one hour. There are no restrictions for this procedure.    Follow-Up: To be determined  Any Other Special Instructions Will Be Listed Below (If Applicable). None

## 2018-05-09 ENCOUNTER — Ambulatory Visit (HOSPITAL_COMMUNITY)
Admission: RE | Admit: 2018-05-09 | Discharge: 2018-05-09 | Disposition: A | Discharge status: Home / Self Care | Payer: BC Managed Care – PPO | Source: Ambulatory Visit | Attending: Cardiology | Admitting: Cardiology

## 2018-05-09 DIAGNOSIS — I25119 Atherosclerotic heart disease of native coronary artery with unspecified angina pectoris: Secondary | ICD-10-CM | POA: Diagnosis present

## 2018-05-09 NOTE — Progress Notes (Signed)
*  PRELIMINARY RESULTS* Echocardiogram 2D Echocardiogram has been performed.  Samuel Germany 05/09/2018, 9:16 AM

## 2018-05-10 ENCOUNTER — Ambulatory Visit (INDEPENDENT_AMBULATORY_CARE_PROVIDER_SITE_OTHER): Payer: BC Managed Care – PPO | Admitting: Physician Assistant

## 2018-05-10 ENCOUNTER — Encounter (INDEPENDENT_AMBULATORY_CARE_PROVIDER_SITE_OTHER): Payer: Self-pay | Admitting: Physician Assistant

## 2018-05-10 ENCOUNTER — Encounter (INDEPENDENT_AMBULATORY_CARE_PROVIDER_SITE_OTHER): Payer: Self-pay | Admitting: Orthopedic Surgery

## 2018-05-10 VITALS — Ht 68.5 in | Wt 247.0 lb

## 2018-05-10 DIAGNOSIS — S86011S Strain of right Achilles tendon, sequela: Secondary | ICD-10-CM

## 2018-05-10 NOTE — Progress Notes (Signed)
Office Visit Note   Patient: [name] [name]           Date of Birth: [birth year]           MRN: [id number] Visit Date: 05/10/2018              Requested by: Sinda Du, MD [address] [place], [place] [address] PCP: Sinda Du, MD  Chief Complaint  Patient presents with  . Right Foot - Follow-up      HPI: The patient is a 63 year old gentleman who is seen for recurrent injury to the right Achilles tendon.  His most recent injury was about 4 months ago.  He has been wearing a left and his shoes and reports that he has no pain but some minimal swelling.  He wears compression stockings.  Assessment & Plan: Visit Diagnoses:  1. Achilles rupture, right, sequela     Plan: Counseled the patient he can discontinue his shoe lifts.  He can continue to wear regular footwear and compression stockings.  He can do some gentle range of motion.  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  He will follow-up here on an as-needed basis.  Follow-Up Instructions: Return if symptoms worsen or fail to improve.   Ortho Exam  Patient is alert, oriented, no adenopathy, well-dressed, normal affect, normal respiratory effort. The right lower extremity has minimal edema.  He has palpable residual defect over the distal Achilles insertion but good plantarflexion and dorsiflexion at least to neutral.  He has good dorsalis pedis pulses.  There are no signs of infection or cellulitis.  Imaging: No results found. No images are attached to the encounter.  Labs: Lab Results  Component Value Date   HGBA1C 8.7 (H) 09/30/2010     Lab Results  Component Value Date   ALBUMIN 4.3 11/29/2015   ALBUMIN 4.8 06/08/2014   ALBUMIN 3.9 04/11/2014    Body mass index is 37.01 kg/m.  Orders:  No orders of the defined types were placed in this encounter.  No orders of the defined  types were placed in this encounter.    Procedures: No procedures performed  Clinical Data: No additional findings.  ROS:  All other systems negative, except as noted in the HPI. Review of Systems  Objective: Vital Signs: Ht 5' 8.5" (1.74 m)   Wt 247 lb (112 kg)   BMI 37.01 kg/m   Specialty Comments:  No specialty comments available.  PMFS History: Patient Active Problem List   Diagnosis Date Noted  . Palpitations 11/26/2010  . Coronary atherosclerosis of native coronary artery 10/23/2010  . Essential hypertension, benign 10/23/2010  . Mixed hyperlipidemia 10/23/2010   Past Medical History:  Diagnosis Date  . Anxiety   . Coronary atherosclerosis of native coronary artery    DES distal circumflex 2005, DES LAD/diagonal bifurcation 7/12  . Essential hypertension   . GERD (gastroesophageal reflux disease)   . History of kidney stones   . Mixed hyperlipidemia   . Myocardial infarction ([facility])    Anterolateral with VF arrest 7/12  . Type 2 diabetes mellitus ([facility]CC)     Family History  Problem Relation Age of Onset  . Hyperlipidemia Mother   . Hypertension Mother   . Hyperlipidemia Father   . Hypertension Father   . Hypertension Unknown     Past Surgical History:  Procedure  Laterality Date  . CARDIAC CATHETERIZATION  2012  . CAROTID STENT     stents x 2   . CHOLECYSTECTOMY N/A 12/04/2015   Procedure: LAPAROSCOPIC CHOLECYSTECTOMY;  Surgeon: Aviva Signs, MD;  Location: [facility];  Service: General;  Laterality: N/A;  . CHOLECYSTECTOMY, LAPAROSCOPIC  12/05/2015  . CORONARY ANGIOPLASTY  2012   STENT X 1 2012, STENT X 1 YRS BEFORE  . HYDROCELE EXCISION Bilateral 06/02/2017   Procedure: HYDROCELECTOMY ADULT;  Surgeon: Ceasar Mons, MD;  [facility];  Service: Urology;  Laterality: Bilateral;  ONLY NEEDS 45 MIN  . INGUINAL HERNIA REPAIR     RIGHT GROIN  . TRIGGER FINGER RELEASE Right 01/08/2015   Procedure: RELEASE TRIGGER FINGER/A-1 PULLEY RIGHT MIDDLE  FINGER;  Surgeon: Daryll Brod, MD;  Location: [facility];  Service: Orthopedics;  Laterality: Right;   Social History   Occupational History  . Occupation: Respiratory Therapy    Employer: [employer]  Tobacco Use  . Smoking status: Never Smoker  . Smokeless tobacco: Never Used  Substance and Sexual Activity  . Alcohol use: No  . Drug use: No  . Sexual activity: Yes

## 2018-05-12 ENCOUNTER — Encounter (INDEPENDENT_AMBULATORY_CARE_PROVIDER_SITE_OTHER): Payer: Self-pay | Admitting: Physician Assistant

## 2018-05-27 ENCOUNTER — Other Ambulatory Visit: Payer: Self-pay | Admitting: Cardiology

## 2018-10-10 LAB — HM DIABETES EYE EXAM

## 2018-11-03 ENCOUNTER — Other Ambulatory Visit: Payer: Self-pay

## 2018-11-03 DIAGNOSIS — Z20822 Contact with and (suspected) exposure to covid-19: Secondary | ICD-10-CM

## 2018-11-04 LAB — NOVEL CORONAVIRUS, NAA: SARS-CoV-2, NAA: NOT DETECTED

## 2019-02-20 ENCOUNTER — Encounter: Payer: Self-pay | Admitting: Orthopedic Surgery

## 2019-02-20 ENCOUNTER — Ambulatory Visit: Payer: Medicare Other

## 2019-02-20 ENCOUNTER — Ambulatory Visit: Payer: Medicare Other | Admitting: Orthopedic Surgery

## 2019-02-20 ENCOUNTER — Other Ambulatory Visit: Payer: Self-pay

## 2019-02-20 VITALS — Ht 68.5 in | Wt 247.0 lb

## 2019-02-20 DIAGNOSIS — M25562 Pain in left knee: Secondary | ICD-10-CM

## 2019-02-21 ENCOUNTER — Encounter: Payer: Self-pay | Admitting: Orthopedic Surgery

## 2019-02-21 DIAGNOSIS — M25562 Pain in left knee: Secondary | ICD-10-CM | POA: Diagnosis not present

## 2019-02-21 MED ORDER — METHYLPREDNISOLONE ACETATE 40 MG/ML IJ SUSP
40.0000 mg | INTRAMUSCULAR | Status: AC | PRN
  Administered 2019-02-21: 40 mg via INTRA_ARTICULAR

## 2019-02-21 MED ORDER — LIDOCAINE HCL 1 % IJ SOLN
1.0000 mL | INTRAMUSCULAR | Status: AC | PRN
  Administered 2019-02-21: 1 mL

## 2019-02-21 NOTE — Progress Notes (Signed)
Office Visit Note   Patient: [name] [name]           Date of Birth: 03-08-1956           MRN: [id number] Visit Date: 02/20/2019              Requested by: Sinda Du, MD [address] [place],  [place] [address] PCP: Sinda Du, MD  Chief Complaint  Patient presents with   Left Knee - Pain      HPI: Patient is a 63 year old gentleman who complains of swelling and warmth to the touch of his left knee he states this has been going on for 2 years.  Patient states that he was playing with his granddaughter went to go down on his knees and abruptly landed on his knee.  Patient complains of swelling in the back of the calf swelling around the patella as well as swelling in his leg.  Assessment & Plan: Visit Diagnoses:  1. Acute pain of left knee     Plan: Patient has no clinical signs or symptoms of a DVT he does have a knee effusion the knee was aspirated and injected reevaluate as needed.  Follow-Up Instructions: Return if symptoms worsen or fail to improve.   Ortho Exam  Patient is alert, oriented, no adenopathy, well-dressed, normal affect, normal respiratory effort. Examination patient does have an effusion of the left knee collaterals and cruciates are stable.  There is tenderness primarily in the patellofemoral joint most likely from the blunt trauma.  Medial lateral joint lines are minimally tender to palpation.  Patient does have pitting edema in the left leg most likely due to the swelling in the popliteal fossa.  Patient has a negative Bevelyn Buckles' sign no pain reproduced with dorsiflexion of the ankle either active or passively.  The calf does have pitting edema but is soft and nontender to palpation there is no cellulitis no ulcers no drainage.  Patient has no pain with range of motion of the knee.  Imaging: Xr Knee 1-2 Views Left  Result Date: 02/21/2019 2 view radiographs of the left knee shows calcification of the popliteal vessels there is medial joint line  narrowing with subchondral sclerosis no cysts no periarticular spurs.  No images are attached to the encounter.  Labs: Lab Results  Component Value Date   HGBA1C 8.7 (H) 09/30/2010     Lab Results  Component Value Date   ALBUMIN 4.3 11/29/2015   ALBUMIN 4.8 06/08/2014   ALBUMIN 3.9 04/11/2014    No results found for: MG No results found for: VD25OH  No results found for: PREALBUMIN CBC EXTENDED Latest Ref Rng & Units 05/31/2017 11/29/2015 01/08/2015  WBC 4.0 - 10.5 K/uL 7.7 6.4 -  RBC 4.22 - 5.81 MIL/uL 5.09 5.15 -  HGB 13.0 - 17.0 g/dL 14.4 14.7 16.3  HCT 39.0 - 52.0 % 44.7 44.5 48.0  PLT 150 - 400 K/uL 280 237 -  NEUTROABS 1.7 - 7.7 K/uL - 3.6 -  LYMPHSABS 0.7 - 4.0 K/uL - 1.9 -     Body mass index is 37.01 kg/m.  Orders:  Orders Placed This Encounter  Procedures   XR Knee 1-2 Views Left   No orders of the defined types were placed in this encounter.    Procedures: Large Joint Inj: L knee on 02/21/2019 12:52 PM Indications: pain and diagnostic evaluation Details: 22 G 1.5 in needle, superolateral approach  Arthrogram: No  Medications: 1 mL lidocaine 1 %; 40 mg methylPREDNISolone  acetate 40 MG/ML Aspirate: 10 mL blood-tinged Outcome: tolerated well, no immediate complications Procedure, treatment alternatives, risks and benefits explained, specific risks discussed. Consent was given by the patient. Immediately prior to procedure a time out was called to verify the correct patient, procedure, equipment, support staff and site/side marked as required. Patient was prepped and draped in the usual sterile fashion.      Clinical Data: No additional findings.  ROS:  All other systems negative, except as noted in the HPI. Review of Systems  Objective: Vital Signs: Ht 5' 8.5" (1.74 m)    Wt 247 lb (112 kg)    BMI 37.01 kg/m   Specialty Comments:  No specialty comments available.  PMFS History: Patient Active Problem List   Diagnosis Date Noted    Palpitations 11/26/2010   Coronary atherosclerosis of native coronary artery 10/23/2010   Essential hypertension, benign 10/23/2010   Mixed hyperlipidemia 10/23/2010   Past Medical History:  Diagnosis Date   Anxiety    Coronary atherosclerosis of native coronary artery    DES distal circumflex 2005, DES LAD/diagonal bifurcation 7/12   Essential hypertension    GERD (gastroesophageal reflux disease)    History of kidney stones    Mixed hyperlipidemia    Myocardial infarction ([facility])    Anterolateral with VF arrest 7/12   Type 2 diabetes mellitus ([facility])     Family History  Problem Relation Age of Onset   Hyperlipidemia Mother    Hypertension Mother    Hyperlipidemia Father    Hypertension Father    Hypertension Unknown     Past Surgical History:  Procedure Laterality Date   CARDIAC CATHETERIZATION  2012   CAROTID STENT     stents x 2    CHOLECYSTECTOMY N/A 12/04/2015   Procedure: LAPAROSCOPIC CHOLECYSTECTOMY;  Surgeon: Aviva Signs, MD;  Location: [facility];  Service: General;  Laterality: N/A;   CHOLECYSTECTOMY, LAPAROSCOPIC  12/05/2015   CORONARY ANGIOPLASTY  2012   STENT X 1 2012, STENT X 1 YRS BEFORE   HYDROCELE EXCISION Bilateral 06/02/2017   Procedure: HYDROCELECTOMY ADULT;  Surgeon: Ceasar Mons, MD;  [facility];  Service: Urology;  Laterality: Bilateral;  ONLY NEEDS 45 MIN   INGUINAL HERNIA REPAIR     RIGHT GROIN   TRIGGER FINGER RELEASE Right 01/08/2015   Procedure: RELEASE TRIGGER FINGER/A-1 PULLEY RIGHT MIDDLE FINGER;  Surgeon: Daryll Brod, MD;  Location: [facility];  Service: Orthopedics;  Laterality: Right;   Social History   Occupational History   Occupation: Respiratory Therapy    Employer: [employer]  Tobacco Use   Smoking status: Never Smoker   Smokeless tobacco: Never Used  Substance and Sexual Activity   Alcohol use: No   Drug use: No   Sexual activity: Yes

## 2019-03-20 ENCOUNTER — Ambulatory Visit: Payer: Medicare Other | Admitting: Orthopedic Surgery

## 2019-03-20 ENCOUNTER — Other Ambulatory Visit: Payer: Self-pay

## 2019-03-20 ENCOUNTER — Encounter: Payer: Self-pay | Admitting: Orthopedic Surgery

## 2019-03-20 VITALS — Ht 68.5 in | Wt 247.0 lb

## 2019-03-20 DIAGNOSIS — M25562 Pain in left knee: Secondary | ICD-10-CM | POA: Diagnosis not present

## 2019-03-20 NOTE — Progress Notes (Signed)
Office Visit Note   Patient: [name] [name]           Date of Birth: May 02, 1955           MRN: [id number] Visit Date: 03/20/2019              Requested by: Sinda Du, MD [address] [place],  [place] [address] PCP: Sinda Du, MD  Chief Complaint  Patient presents with  . Left Knee - Follow-up      HPI Patient is here in follow up in his left knee. 3 weeks ago he received a steroid injection which helped for a couple of days. Then the pain came back after walking on a treadmill. He also has some swelling in his lower leg but no pain  Assessment & Plan: Visit Diagnoses:  1. Acute pain of left knee     Plan: We will obtain an MRI of his left knee. He does have compression socks and he will use these as well  Follow-Up Instructions: No follow-ups on file.   Ortho Exam  Patient is alert, oriented, no adenopathy, well-dressed, normal affect, normal respiratory effort. Left leg : mild swelling no cellulitis in knee and lower leg. Calf non tender to palpation Negative Homans  Imaging: No results found. No images are attached to the encounter.  Labs: Lab Results  Component Value Date   HGBA1C 8.7 (H) 09/30/2010     Lab Results  Component Value Date   ALBUMIN 4.3 11/29/2015   ALBUMIN 4.8 06/08/2014   ALBUMIN 3.9 04/11/2014    No results found for: MG No results found for: VD25OH  No results found for: PREALBUMIN CBC EXTENDED Latest Ref Rng & Units 05/31/2017 11/29/2015 01/08/2015  WBC 4.0 - 10.5 K/uL 7.7 6.4 -  RBC 4.22 - 5.81 MIL/uL 5.09 5.15 -  HGB 13.0 - 17.0 g/dL 14.4 14.7 16.3  HCT 39.0 - 52.0 % 44.7 44.5 48.0  PLT 150 - 400 K/uL 280 237 -  NEUTROABS 1.7 - 7.7 K/uL - 3.6 -  LYMPHSABS 0.7 - 4.0 K/uL - 1.9 -     Body mass index is 37.01 kg/m.  Orders:  Orders Placed This Encounter  Procedures  . MR Knee Left w/o contrast   No orders of the defined types were placed in this encounter.    Procedures: No procedures  performed  Clinical Data: No additional findings.  ROS:  All other systems negative, except as noted in the HPI. Review of Systems  Objective: Vital Signs: Ht 5' 8.5" (1.74 m)   Wt 247 lb (112 kg)   BMI 37.01 kg/m   Specialty Comments:  No specialty comments available.  PMFS History: Patient Active Problem List   Diagnosis Date Noted  . Palpitations 11/26/2010  . Coronary atherosclerosis of native coronary artery 10/23/2010  . Essential hypertension, benign 10/23/2010  . Mixed hyperlipidemia 10/23/2010   Past Medical History:  Diagnosis Date  . Anxiety   . Coronary atherosclerosis of native coronary artery    DES distal circumflex 2005, DES LAD/diagonal bifurcation 7/12  . Essential hypertension   . GERD (gastroesophageal reflux disease)   . History of kidney stones   . Mixed hyperlipidemia   . Myocardial infarction ([facility])    Anterolateral with VF arrest 7/12  . Type 2 diabetes mellitus ([facility])     Family History  Problem Relation Age of Onset  . Hyperlipidemia Mother   . Hypertension Mother   . Hyperlipidemia Father   . Hypertension Father   .  Hypertension Unknown     Past Surgical History:  Procedure Laterality Date  . CARDIAC CATHETERIZATION  2012  . CAROTID STENT     stents x 2   . CHOLECYSTECTOMY N/A 12/04/2015   Procedure: LAPAROSCOPIC CHOLECYSTECTOMY;  Surgeon: Aviva Signs, MD;  [facility];  Service: General;  Laterality: N/A;  . CHOLECYSTECTOMY, LAPAROSCOPIC  12/05/2015  . CORONARY ANGIOPLASTY  2012   STENT X 1 2012, STENT X 1 YRS BEFORE  . HYDROCELE EXCISION Bilateral 06/02/2017   Procedure: HYDROCELECTOMY ADULT;  Surgeon: Ceasar Mons, MD;  [facility];  Service: Urology;  Laterality: Bilateral;  ONLY NEEDS 45 MIN  . INGUINAL HERNIA REPAIR     RIGHT GROIN  . TRIGGER FINGER RELEASE Right 01/08/2015   Procedure: RELEASE TRIGGER FINGER/A-1 PULLEY RIGHT MIDDLE FINGER;  Surgeon: Daryll Brod, MD;  Location: [facility];   Service: Orthopedics;  Laterality: Right;   Social History   Occupational History  . Occupation: Respiratory Therapy    Employer: [employer]  Tobacco Use  . Smoking status: Never Smoker  . Smokeless tobacco: Never Used  Substance and Sexual Activity  . Alcohol use: No  . Drug use: No  . Sexual activity: Yes

## 2019-04-04 ENCOUNTER — Other Ambulatory Visit: Payer: Self-pay

## 2019-04-04 ENCOUNTER — Ambulatory Visit (HOSPITAL_COMMUNITY)
Admission: RE | Admit: 2019-04-04 | Discharge: 2019-04-04 | Disposition: A | Discharge status: Home / Self Care | Payer: Medicare PPO | Source: Ambulatory Visit | Attending: Physician Assistant | Admitting: Physician Assistant

## 2019-04-04 DIAGNOSIS — M25562 Pain in left knee: Secondary | ICD-10-CM

## 2019-04-06 ENCOUNTER — Encounter: Payer: Self-pay | Admitting: Orthopedic Surgery

## 2019-04-06 ENCOUNTER — Other Ambulatory Visit: Payer: Self-pay

## 2019-04-06 ENCOUNTER — Ambulatory Visit: Payer: Medicare PPO | Admitting: Orthopedic Surgery

## 2019-04-06 VITALS — Ht 68.0 in | Wt 247.0 lb

## 2019-04-06 DIAGNOSIS — M23222 Derangement of posterior horn of medial meniscus due to old tear or injury, left knee: Secondary | ICD-10-CM | POA: Diagnosis not present

## 2019-04-06 NOTE — Progress Notes (Signed)
Office Visit Note   Patient: [name] [name]           Date of Birth: 1955-06-17           MRN: [id number] Visit Date: 04/06/2019              Requested by: Sinda Du, [address] [place],  [place] [address] PCP: Patient, No Pcp Per  Chief Complaint  Patient presents with  . Left Knee - Follow-up    MRi review       HPI: Patient is a 64 year old gentleman who presents in follow-up status post MRI scan left knee.  Patient states he still having mechanical symptoms with catching locking and giving way.  Patient states pain is primarily over the medial joint line.  Patient has negative history for sleep apnea he is not a smoker.  Assessment & Plan: Visit Diagnoses:  1. Derang of post horn of medial mensc d/t old tear/inj, l knee     Plan: With patient's mechanical symptoms despite conservative treatment we will plan for arthroscopic debridement of the medial meniscal tear left knee.  Risk and benefits were discussed including infection neurovascular injury persistent pain DVT need for additional surgery.  Patient states he like to proceed with outpatient surgery.  Follow-Up Instructions: Return in about 2 weeks (around 04/20/2019) for Follow-up 2 weeks postoperatively.   Ortho Exam  Patient is alert, oriented, no adenopathy, well-dressed, normal affect, normal respiratory effort. Examination patient has an antalgic gait the effusion of the left knee has resolved he has pain to palpation of the medial joint line flexion and rotation reproduces his pain with popping.  There is no redness no cellulitis no effusion today.  Imaging: No results found. No images are attached to the encounter.  Labs: Lab Results  Component Value Date   HGBA1C 8.7 (H) 09/30/2010     Lab Results  Component Value Date   ALBUMIN 4.3 11/29/2015   ALBUMIN 4.8 06/08/2014   ALBUMIN 3.9 04/11/2014    No results found for: MG No results found for: VD25OH  No results found for:  PREALBUMIN CBC EXTENDED Latest Ref Rng & Units 05/31/2017 11/29/2015 01/08/2015  WBC 4.0 - 10.5 K/uL 7.7 6.4 -  RBC 4.22 - 5.81 MIL/uL 5.09 5.15 -  HGB 13.0 - 17.0 g/dL 14.4 14.7 16.3  HCT 39.0 - 52.0 % 44.7 44.5 48.0  PLT 150 - 400 K/uL 280 237 -  NEUTROABS 1.7 - 7.7 K/uL - 3.6 -  LYMPHSABS 0.7 - 4.0 K/uL - 1.9 -     Body mass index is 37.56 kg/m.  Orders:  No orders of the defined types were placed in this encounter.  No orders of the defined types were placed in this encounter.    Procedures: No procedures performed  Clinical Data: No additional findings.  ROS:  All other systems negative, except as noted in the HPI. Review of Systems  Objective: Vital Signs: Ht 5\' 8"  (1.727 m)   Wt 247 lb (112 kg)   BMI 37.56 kg/m   Specialty Comments:  No specialty comments available.  PMFS History: Patient Active Problem List   Diagnosis Date Noted  . Palpitations 11/26/2010  . Coronary atherosclerosis of native coronary artery 10/23/2010  . Essential hypertension, benign 10/23/2010  . Mixed hyperlipidemia 10/23/2010   Past Medical History:  Diagnosis Date  . Anxiety   . Coronary atherosclerosis of native coronary artery    DES distal circumflex 2005, DES LAD/diagonal bifurcation 7/12  .  Essential hypertension   . GERD (gastroesophageal reflux disease)   . History of kidney stones   . Mixed hyperlipidemia   . Myocardial infarction ([facility])    Anterolateral with VF arrest 7/12  . Type 2 diabetes mellitus ([facility])     Family History  Problem Relation Age of Onset  . Hyperlipidemia Mother   . Hypertension Mother   . Hyperlipidemia Father   . Hypertension Father   . Hypertension Unknown     Past Surgical History:  Procedure Laterality Date  . CARDIAC CATHETERIZATION  2012  . CAROTID STENT     stents x 2   . CHOLECYSTECTOMY N/A 12/04/2015   Procedure: LAPAROSCOPIC CHOLECYSTECTOMY;  Surgeon: Aviva Signs, MD;  [facility];  Service: General;  Laterality: N/A;  .  CHOLECYSTECTOMY, LAPAROSCOPIC  12/05/2015  . CORONARY ANGIOPLASTY  2012   STENT X 1 2012, STENT X 1 YRS BEFORE  . HYDROCELE EXCISION Bilateral 06/02/2017   Procedure: HYDROCELECTOMY ADULT;  Surgeon: Ceasar Mons, MD;  [facility];  Service: Urology;  Laterality: Bilateral;  ONLY NEEDS 45 MIN  . INGUINAL HERNIA REPAIR     RIGHT GROIN  . TRIGGER FINGER RELEASE Right 01/08/2015   Procedure: RELEASE TRIGGER FINGER/A-1 PULLEY RIGHT MIDDLE FINGER;  Surgeon: Daryll Brod, MD;  Location: [facility];  Service: Orthopedics;  Laterality: Right;   Social History   Occupational History  . Occupation: Respiratory Therapy    Employer: [employer]  Tobacco Use  . Smoking status: Never Smoker  . Smokeless tobacco: Never Used  Substance and Sexual Activity  . Alcohol use: No  . Drug use: No  . Sexual activity: Yes

## 2019-04-13 ENCOUNTER — Telehealth: Payer: Self-pay | Admitting: Cardiology

## 2019-04-13 ENCOUNTER — Telehealth: Payer: Self-pay | Admitting: General Practice

## 2019-04-13 MED ORDER — METFORMIN HCL 500 MG PO TABS: 1000.0000 mg | ORAL_TABLET | Freq: Two times a day (BID) | ORAL | Status: DC

## 2019-04-13 MED ORDER — METFORMIN HCL 500 MG PO TABS: 1000.0000 mg | ORAL_TABLET | Freq: Two times a day (BID) | ORAL | 0 refills | Status: DC

## 2019-04-13 MED ORDER — ONETOUCH ULTRASOFT LANCETS MISC: 0 refills | Status: DC

## 2019-04-13 MED ORDER — LISINOPRIL 40 MG PO TABS: 20.0000 mg | ORAL_TABLET | Freq: Two times a day (BID) | ORAL | 0 refills | Status: DC

## 2019-04-13 NOTE — Telephone Encounter (Signed)
Patient currently looking for new pmd since Dr. Luan Pulling retired.  Wants to know if Dr. Domenic Polite will refill his Metformin & lancets till he can establish with someone.    Metformin 500 - 2 tabs twice a day  Lisinopril 40 - 1/2 tab twice a day   One Touch Lancets - twice a day

## 2019-04-13 NOTE — Telephone Encounter (Signed)
I will be glad to see him- please make him an appointment

## 2019-04-13 NOTE — Telephone Encounter (Signed)
Please give pt a call concerning his medications

## 2019-04-13 NOTE — Telephone Encounter (Signed)
Yes we can fill these temporarily until he finds PCP.  Make sure he knows about Dr. Holly Bodily who has been taking most of Dr. Luan Pulling patients.  Give him the number if needed.

## 2019-04-13 NOTE — Telephone Encounter (Signed)
Patient aware and he will come by to sign new patient paper work. Appointment given.

## 2019-04-13 NOTE — Telephone Encounter (Signed)
Patient notified and verbalized understanding.  Stated he just got an appointment with Ronnald Collum at [facility] for 04/20/2019.

## 2019-04-19 ENCOUNTER — Other Ambulatory Visit: Payer: Self-pay

## 2019-04-20 ENCOUNTER — Ambulatory Visit: Payer: Medicare PPO | Admitting: Nurse Practitioner

## 2019-04-20 ENCOUNTER — Encounter: Payer: Self-pay | Admitting: Nurse Practitioner

## 2019-04-20 VITALS — BP 141/80 | HR 79 | Temp 97.5°F | Resp 20 | Ht 68.0 in | Wt 239.0 lb

## 2019-04-20 DIAGNOSIS — E782 Mixed hyperlipidemia: Secondary | ICD-10-CM | POA: Diagnosis not present

## 2019-04-20 DIAGNOSIS — E119 Type 2 diabetes mellitus without complications: Secondary | ICD-10-CM | POA: Insufficient documentation

## 2019-04-20 DIAGNOSIS — E1159 Type 2 diabetes mellitus with other circulatory complications: Secondary | ICD-10-CM | POA: Diagnosis not present

## 2019-04-20 DIAGNOSIS — R3911 Hesitancy of micturition: Secondary | ICD-10-CM

## 2019-04-20 DIAGNOSIS — K219 Gastro-esophageal reflux disease without esophagitis: Secondary | ICD-10-CM

## 2019-04-20 DIAGNOSIS — I10 Essential (primary) hypertension: Secondary | ICD-10-CM

## 2019-04-20 DIAGNOSIS — Z6836 Body mass index (BMI) 36.0-36.9, adult: Secondary | ICD-10-CM | POA: Diagnosis not present

## 2019-04-20 DIAGNOSIS — N401 Enlarged prostate with lower urinary tract symptoms: Secondary | ICD-10-CM

## 2019-04-20 DIAGNOSIS — Z794 Long term (current) use of insulin: Secondary | ICD-10-CM

## 2019-04-20 LAB — BAYER DCA HB A1C WAIVED: HB A1C (BAYER DCA - WAIVED): 7 % — ABNORMAL HIGH (ref <7.0)

## 2019-04-20 MED ORDER — LANTUS SOLOSTAR 100 UNIT/ML ~~LOC~~ SOPN: PEN_INJECTOR | SUBCUTANEOUS | 5 refills | Status: DC

## 2019-04-20 MED ORDER — TAMSULOSIN HCL 0.4 MG PO CAPS: 0.4000 mg | ORAL_CAPSULE | Freq: Every day | ORAL | 1 refills | Status: DC

## 2019-04-20 MED ORDER — CARVEDILOL 12.5 MG PO TABS: 12.5000 mg | ORAL_TABLET | Freq: Two times a day (BID) | ORAL | 1 refills | Status: DC

## 2019-04-20 MED ORDER — ATORVASTATIN CALCIUM 10 MG PO TABS: 10.0000 mg | ORAL_TABLET | Freq: Every day | ORAL | 1 refills | Status: DC

## 2019-04-20 MED ORDER — METFORMIN HCL 500 MG PO TABS: 1000.0000 mg | ORAL_TABLET | Freq: Two times a day (BID) | ORAL | 0 refills | Status: DC

## 2019-04-20 MED ORDER — VICTOZA 18 MG/3ML ~~LOC~~ SOPN: 1.8000 mg | PEN_INJECTOR | Freq: Every day | SUBCUTANEOUS | 5 refills | Status: DC

## 2019-04-20 MED ORDER — PANTOPRAZOLE SODIUM 40 MG PO TBEC: 40.0000 mg | DELAYED_RELEASE_TABLET | Freq: Every day | ORAL | 1 refills | Status: DC | PRN

## 2019-04-20 NOTE — Patient Instructions (Signed)
Diabetes Mellitus and Foot Care Foot care is an important part of your health, especially when you have diabetes. Diabetes may cause you to have problems because of poor blood flow (circulation) to your feet and legs, which can cause your skin to:  Become thinner and drier.  Break more easily.  Heal more slowly.  Peel and crack. You may also have nerve damage (neuropathy) in your legs and feet, causing decreased feeling in them. This means that you may not notice minor injuries to your feet that could lead to more serious problems. Noticing and addressing any potential problems early is the best way to prevent future foot problems. How to care for your feet Foot hygiene  Wash your feet daily with warm water and mild soap. Do not use hot water. Then, pat your feet and the areas between your toes until they are completely dry. Do not soak your feet as this can dry your skin.  Trim your toenails straight across. Do not dig under them or around the cuticle. File the edges of your nails with an emery board or nail file.  Apply a moisturizing lotion or petroleum jelly to the skin on your feet and to dry, brittle toenails. Use lotion that does not contain alcohol and is unscented. Do not apply lotion between your toes. Shoes and socks  Wear clean socks or stockings every day. Make sure they are not too tight. Do not wear knee-high stockings since they may decrease blood flow to your legs.  Wear shoes that fit properly and have enough cushioning. Always look in your shoes before you put them on to be sure there are no objects inside.  To break in new shoes, wear them for just a few hours a day. This prevents injuries on your feet. Wounds, scrapes, corns, and calluses  Check your feet daily for blisters, cuts, bruises, sores, and redness. If you cannot see the bottom of your feet, use a mirror or ask someone for help.  Do not cut corns or calluses or try to remove them with medicine.  If you  find a minor scrape, cut, or break in the skin on your feet, keep it and the skin around it clean and dry. You may clean these areas with mild soap and water. Do not clean the area with peroxide, alcohol, or iodine.  If you have a wound, scrape, corn, or callus on your foot, look at it several times a day to make sure it is healing and not infected. Check for: ? Redness, swelling, or pain. ? Fluid or blood. ? Warmth. ? Pus or a bad smell. General instructions  Do not cross your legs. This may decrease blood flow to your feet.  Do not use heating pads or hot water bottles on your feet. They may burn your skin. If you have lost feeling in your feet or legs, you may not know this is happening until it is too late.  Protect your feet from hot and cold by wearing shoes, such as at the beach or on hot pavement.  Schedule a complete foot exam at least once a year (annually) or more often if you have foot problems. If you have foot problems, report any cuts, sores, or bruises to your health care provider immediately. Contact a health care provider if:  You have a medical condition that increases your risk of infection and you have any cuts, sores, or bruises on your feet.  You have an injury that is not   healing.  You have redness on your legs or feet.  You feel burning or tingling in your legs or feet.  You have pain or cramps in your legs and feet.  Your legs or feet are numb.  Your feet always feel cold.  You have pain around a toenail. Get help right away if:  You have a wound, scrape, corn, or callus on your foot and: ? You have pain, swelling, or redness that gets worse. ? You have fluid or blood coming from the wound, scrape, corn, or callus. ? Your wound, scrape, corn, or callus feels warm to the touch. ? You have pus or a bad smell coming from the wound, scrape, corn, or callus. ? You have a fever. ? You have a red line going up your leg. Summary  Check your feet every day  for cuts, sores, red spots, swelling, and blisters.  Moisturize feet and legs daily.  Wear shoes that fit properly and have enough cushioning.  If you have foot problems, report any cuts, sores, or bruises to your health care provider immediately.  Schedule a complete foot exam at least once a year (annually) or more often if you have foot problems. This information is not intended to replace advice given to you by your health care provider. Make sure you discuss any questions you have with your health care provider. Document Revised: 12/07/2018 Document Reviewed: 04/17/2016 Elsevier Patient Education  2020 Elsevier Inc.  

## 2019-04-20 NOTE — Progress Notes (Signed)
Subjective:    Patient ID: [name] [name], male    DOB: 08-Sep-1955, 64 y.o.   MRN: [id number]   Chief Complaint: Medical Management of Chronic Issues   * He is  New patient at practice.  HPI:  1. Essential hypertension, benign No c/o chest pain, sob or headache. Does not check blood pressure at home.   2. Mixed hyperlipidemia Not watching diet right now and is not exercising. He goes on diets frequently and usually looses a lot of weight then gains it back. He said his last total cholesterol was 98.   3. Type 2 diabetes mellitus with other circulatory complication, with long-term current use of insulin (HCC) Fasting blood sugars running around 60-140. None lower then 60. He is on lantus and victozia. Last HGBA1c was dne in 09/2018 and was 6.6%   4. BMI 36.0-36.9,adult Knows how to lose weight.  5. BPH On flomax which helps with voiding issues  6.GERD Is on protonix daoly and says works well to keep symptoms under control.    Outpatient Encounter Medications as of 04/20/2019  Medication Sig  . aspirin 81 MG tablet Take 81 mg by mouth daily.    Marland Kitchen atorvastatin (LIPITOR) 10 MG tablet TAKE 1 TABLET BY MOUTH ONCE DAILY.  . carvedilol (COREG) 12.5 MG tablet TAKE (1) TABLET TWICE DAILY.  Marland Kitchen CINNAMON PO Take 1,000 mg by mouth 2 (two) times daily.   Marland Kitchen CRANBERRY PO Take 4,200 mg by mouth 2 (two) times daily.   Marland Kitchen ibuprofen (ADVIL,MOTRIN) 200 MG tablet Take 600 mg by mouth every 6 (six) hours as needed for headache or moderate pain.   Marland Kitchen insulin glargine (LANTUS) 100 UNIT/ML injection Inject 40-70 Units into the skin See admin instructions. Inject 40 units SQ in the morning and 70 units SQ at bedtime  . Lancets (ONETOUCH ULTRASOFT) lancets Use as instructed twice a day  . Liraglutide (VICTOZA) 18 MG/3ML SOLN Inject 1.8 mg into the skin daily.    Marland Kitchen lisinopril (ZESTRIL) 40 MG tablet Take 0.5 tablets (20 mg total) by mouth 2 (two) times daily.  Marland Kitchen loratadine (CLARITIN) 10 MG tablet Take by  mouth.  . metFORMIN (GLUCOPHAGE) 500 MG tablet Take 2 tablets (1,000 mg total) by mouth 2 (two) times daily with a meal.  . nitroGLYCERIN (NITROSTAT) 0.4 MG SL tablet Place 1 tablet (0.4 mg total) under the tongue every 5 (five) minutes as needed for chest pain.  Marland Kitchen NOVOLOG FLEXPEN 100 UNIT/ML FlexPen Inject 15 Units into the skin 3 (three) times daily with meals.   . pantoprazole (PROTONIX) 40 MG tablet Take 40 mg by mouth daily as needed (for acid reflux).  . prasugrel (EFFIENT) 10 MG TABS tablet Take 10 mg by mouth daily  . tamsulosin (FLOMAX) 0.4 MG CAPS capsule Take 0.4 mg by mouth at bedtime.   . triamcinolone (NASACORT ALLERGY 24HR) 55 MCG/ACT AERO nasal inhaler Place 2 sprays into the nose daily as needed (for allergies).  . [DISCONTINUED] Cranberry POWD Take by mouth.  . [DISCONTINUED] zolpidem (AMBIEN) 10 MG tablet Take by mouth.   No facility-administered encounter medications on file as of 04/20/2019.    Past Surgical History:  Procedure Laterality Date  . CARDIAC CATHETERIZATION  2012  . CAROTID STENT     stents x 2   . CHOLECYSTECTOMY N/A 12/04/2015   Procedure: LAPAROSCOPIC CHOLECYSTECTOMY;  Surgeon: Aviva Signs, MD;  Location: [facility];  Service: General;  Laterality: N/A;  . CHOLECYSTECTOMY, LAPAROSCOPIC  12/05/2015  . CORONARY  ANGIOPLASTY  2012   STENT X 1 2012, STENT X 1 YRS BEFORE  . HYDROCELE EXCISION Bilateral 06/02/2017   Procedure: HYDROCELECTOMY ADULT;  Surgeon: Ceasar Mons, MD;  Location: [facility];  Service: Urology;  Laterality: Bilateral;  ONLY NEEDS 45 MIN  . INGUINAL HERNIA REPAIR     RIGHT GROIN  . TRIGGER FINGER RELEASE Right 01/08/2015   Procedure: RELEASE TRIGGER FINGER/A-1 PULLEY RIGHT MIDDLE FINGER;  Surgeon: Daryll Brod, MD;  Location: [facility];  Service: Orthopedics;  Laterality: Right;    Family History  Problem Relation Age of Onset  . Hyperlipidemia Mother   . Hypertension Mother   . Hyperlipidemia Father   .  Hypertension Father   . Hypertension Other     New complaints: None today  Social history: Lives with wife and has 3 grandchidren  Controlled substance contract: n/a    Review of Systems  Constitutional: Negative for diaphoresis.  Eyes: Negative for pain.  Respiratory: Negative for shortness of breath.   Cardiovascular: Negative for chest pain, palpitations and leg swelling.  Gastrointestinal: Negative for abdominal pain.  Endocrine: Negative for polydipsia.  Genitourinary: Negative.   Musculoskeletal: Negative.   Skin: Negative for rash.  Neurological: Negative for dizziness, weakness and headaches.  Hematological: Does not bruise/bleed easily.  All other systems reviewed and are negative.      Objective:   Physical Exam Vitals and nursing note reviewed.  Constitutional:      Appearance: Normal appearance. He is well-developed.  HENT:     Head: Normocephalic.     Nose: Nose normal.  Eyes:     Pupils: Pupils are equal, round, and reactive to light.  Neck:     Thyroid: No thyroid mass or thyromegaly.     Vascular: No carotid bruit or JVD.     Trachea: Phonation normal.  Cardiovascular:     Rate and Rhythm: Normal rate and regular rhythm.  Pulmonary:     Effort: Pulmonary effort is normal. No respiratory distress.     Breath sounds: Normal breath sounds.  Abdominal:     General: Bowel sounds are normal.     Palpations: Abdomen is soft.     Tenderness: There is no abdominal tenderness.  Musculoskeletal:        General: Normal range of motion.     Cervical back: Normal range of motion and neck supple.  Lymphadenopathy:     Cervical: No cervical adenopathy.  Skin:    General: Skin is warm and dry.  Neurological:     Mental Status: He is alert and oriented to person, place, and time.  Psychiatric:        Behavior: Behavior normal.        Thought Content: Thought content normal.        Judgment: Judgment normal.     HGBA1c 7.0%  BP (!) 141/80   Pulse  79   Temp (!) 97.5 F (36.4 C) (Temporal)   Resp 20   Ht '5\' 8"'  (1.727 m)   Wt 239 lb (108.4 kg)   SpO2 97%   BMI 36.34 kg/m      Assessment & Plan:  [name] [name] comes in today with chief complaint of Medical Management of Chronic Issues   Diagnosis and orders addressed:  1. Essential hypertension, benign Low sodium diet - CMP14+EGFR - carvedilol (COREG) 12.5 MG tablet; Take 1 tablet (12.5 mg total) by mouth 2 (two) times daily with a meal.  Dispense: 180 tablet; Refill:  1  2. Mixed hyperlipidemia Low fat diet - Lipid panel - atorvastatin (LIPITOR) 10 MG tablet; Take 1 tablet (10 mg total) by mouth daily.  Dispense: 90 tablet; Refill: 1  3. Type 2 diabetes mellitus with other circulatory complication, with long-term current use of insulin (HCC) Continue to watch carbs  - Bayer DCA Hb A1c Waived - Microalbumin / creatinine urine ratio - metFORMIN (GLUCOPHAGE) 500 MG tablet; Take 2 tablets (1,000 mg total) by mouth 2 (two) times daily with a meal.  Dispense: 120 tablet; Refill: 0 - Insulin Glargine (LANTUS SOLOSTAR) 100 UNIT/ML Solostar Pen; 40u qam and 70 u in evening  Dispense: 30 pen; Refill: 5 - liraglutide (VICTOZA) 18 MG/3ML SOPN; Inject 0.3 mLs (1.8 mg total) into the skin daily.  Dispense: 3 pen; Refill: 5  4. BMI 36.0-36.9,adult Discussed diet and exercise for person with BMI >25 Will recheck weight in 3-6 months   5. Benign prostatic hyperplasia with urinary hesitancy - tamsulosin (FLOMAX) 0.4 MG CAPS capsule; Take 1 capsule (0.4 mg total) by mouth at bedtime.  Dispense: 90 capsule; Refill: 1  6. Gastroesophageal reflux disease without esophagitis Avoid spicy foods Do not eat 2 hours prior to bedtime - pantoprazole (PROTONIX) 40 MG tablet; Take 1 tablet (40 mg total) by mouth daily as needed (for acid reflux).  Dispense: 90 tablet; Refill: 1   Labs pending Health Maintenance reviewed Diet and exercise encouraged  Follow up plan: 3  months   [name], FNP

## 2019-04-21 LAB — LIPID PANEL
Chol/HDL Ratio: 4.3 ratio (ref 0.0–5.0)
Cholesterol, Total: 94 mg/dL — ABNORMAL LOW (ref 100–199)
HDL: 22 mg/dL — ABNORMAL LOW (ref >39)
LDL Chol Calc (NIH): 45 mg/dL (ref 0–99)
Triglycerides: 158 mg/dL — ABNORMAL HIGH (ref 0–149)
VLDL Cholesterol Cal: 27 mg/dL (ref 5–40)

## 2019-04-21 LAB — CMP14+EGFR
ALT: 26 IU/L (ref 0–44)
AST: 19 IU/L (ref 0–40)
Albumin/Globulin Ratio: 1.8 (ref 1.2–2.2)
Albumin: 4.3 g/dL (ref 3.8–4.8)
Alkaline Phosphatase: 48 IU/L (ref 39–117)
BUN/Creatinine Ratio: 21 (ref 10–24)
BUN: 22 mg/dL (ref 8–27)
Bilirubin Total: 0.4 mg/dL (ref 0.0–1.2)
CO2: 25 mmol/L (ref 20–29)
Calcium: 9.4 mg/dL (ref 8.6–10.2)
Chloride: 102 mmol/L (ref 96–106)
Creatinine, Ser: 1.05 mg/dL (ref 0.76–1.27)
GFR calc Af Amer: 86 mL/min/{1.73_m2} (ref >59)
GFR calc non Af Amer: 74 mL/min/{1.73_m2} (ref >59)
Globulin, Total: 2.4 g/dL (ref 1.5–4.5)
Glucose: 117 mg/dL — ABNORMAL HIGH (ref 65–99)
Potassium: 5 mmol/L (ref 3.5–5.2)
Sodium: 142 mmol/L (ref 134–144)
Total Protein: 6.7 g/dL (ref 6.0–8.5)

## 2019-04-21 LAB — MICROALBUMIN / CREATININE URINE RATIO
Creatinine, Urine: 182.3 mg/dL
Microalb/Creat Ratio: 43 mg/g creat — ABNORMAL HIGH (ref 0–29)
Microalbumin, Urine: 78.3 ug/mL

## 2019-04-26 ENCOUNTER — Telehealth: Payer: Self-pay | Admitting: Nurse Practitioner

## 2019-04-26 ENCOUNTER — Other Ambulatory Visit: Payer: Self-pay | Admitting: Nurse Practitioner

## 2019-04-26 MED ORDER — BLOOD GLUCOSE METER KIT: PACK | 0 refills | Status: AC

## 2019-04-26 NOTE — Telephone Encounter (Signed)
Please order mandy

## 2019-04-26 NOTE — Telephone Encounter (Signed)
Faxed glucose meter rx to [facility] pharmacy

## 2019-04-26 NOTE — Progress Notes (Unsigned)
accuchek

## 2019-05-04 ENCOUNTER — Other Ambulatory Visit: Payer: Self-pay | Admitting: Physician Assistant

## 2019-05-04 ENCOUNTER — Other Ambulatory Visit: Payer: Self-pay

## 2019-05-16 ENCOUNTER — Other Ambulatory Visit: Payer: Self-pay

## 2019-05-16 ENCOUNTER — Telehealth: Payer: Self-pay | Admitting: Orthopedic Surgery

## 2019-05-16 ENCOUNTER — Other Ambulatory Visit: Payer: Self-pay | Admitting: Cardiology

## 2019-05-16 ENCOUNTER — Encounter: Payer: Self-pay | Admitting: Cardiology

## 2019-05-16 ENCOUNTER — Ambulatory Visit: Payer: Medicare PPO | Admitting: Cardiology

## 2019-05-16 VITALS — BP 134/78 | HR 79 | Temp 97.1°F | Ht 69.0 in | Wt 236.0 lb

## 2019-05-16 DIAGNOSIS — I25119 Atherosclerotic heart disease of native coronary artery with unspecified angina pectoris: Secondary | ICD-10-CM | POA: Diagnosis not present

## 2019-05-16 DIAGNOSIS — E1165 Type 2 diabetes mellitus with hyperglycemia: Secondary | ICD-10-CM

## 2019-05-16 DIAGNOSIS — E782 Mixed hyperlipidemia: Secondary | ICD-10-CM | POA: Diagnosis not present

## 2019-05-16 DIAGNOSIS — I10 Essential (primary) hypertension: Secondary | ICD-10-CM | POA: Diagnosis not present

## 2019-05-16 MED ORDER — NITROGLYCERIN 0.4 MG SL SUBL: 0.4000 mg | SUBLINGUAL_TABLET | SUBLINGUAL | 3 refills | Status: DC | PRN

## 2019-05-16 NOTE — Telephone Encounter (Signed)
[name] [name] patient °

## 2019-05-16 NOTE — Telephone Encounter (Signed)
Patient called. He would like to know if he should stop taking prasugruel.

## 2019-05-16 NOTE — Telephone Encounter (Signed)
Called and sw pt he states that he met with his cardiologist today and being that he is scheduled for a knee scope  next Tuesday and wanted to know if he needed to stop his blood thinner prior to the surgery. Advised no he did not have to do this per protocol it is not a high volume blood loss procedure and that he could continue with this. The hospital will also call and go over medications and when to hold those prior to the surgery he is to call with any questions.

## 2019-05-16 NOTE — Patient Instructions (Signed)
Medication Instructions:  Your physician recommends that you continue on your current medications as directed. Please refer to the Current Medication list given to you today.  *If you need a refill on your cardiac medications before your next appointment, please call your pharmacy*  Lab Work: None If you have labs (blood work) drawn today and your tests are completely normal, you will receive your results only by: Marland Kitchen MyChart Message (if you have MyChart) OR . A paper copy in the mail If you have any lab test that is abnormal or we need to change your treatment, we will call you to review the results.  Testing/Procedures: None  Follow-Up: At [facility], you and your health needs are our priority.  As part of our continuing mission to provide you with exceptional heart care, we have created designated Provider Care Teams.  These Care Teams include your primary Cardiologist (physician) and Advanced Practice Providers (APPs -  Physician Assistants and Nurse Practitioners) who all work together to provide you with the care you need, when you need it.  Your next appointment:   12 month(s)  The format for your next appointment:   In Person  Provider:   Rozann Lesches, MD  Other Instructions None     Thank you for choosing [facility] !

## 2019-05-16 NOTE — Progress Notes (Signed)
Cardiology Office Note  Date: 05/16/2019   ID: [name], [name] 14-Dec-1955, MRN [id number]  PCP:  [name] Pretty, FNP  Cardiologist:  [name] Lesches, MD Electrophysiologist:  None   Chief Complaint  Patient presents with  . Cardiac follow-up    History of Present Illness: [name] [name] is a 64 y.o. male last seen in February 2020.  He presents for a routine visit.  From a cardiac perspective, he reports no obvious angina symptoms or worsening dyspnea with typical activities. He currently does not have a nitroglycerin refill.  He tells me that he has not been working at [employer] during the pandemic.  Admits that he has been sedentary at home, no regular exercise plan.  In fact, he recently injured his left knee and anticipates arthroscopic surgery next week.  I told him that he could hold aspirin and Effient as needed for the procedure - he will check with the orthopedic office.  Follow-up echocardiogram in February 2020 revealed LVEF 60 to 65%, also mildly dilated ascending aorta.  I reviewed his medications.  Current cardiac regimen includes aspirin, Effient, Lipitor, Coreg, lisinopril, we are providing refill for nitroglycerin as well.  I reviewed his recent lab work from January as outlined below.  LDL was 45.  Past Medical History:  Diagnosis Date  . Anxiety   . Coronary atherosclerosis of native coronary artery    DES distal circumflex 2005, DES LAD/diagonal bifurcation 7/12  . Essential hypertension   . GERD (gastroesophageal reflux disease)   . History of kidney stones   . Mixed hyperlipidemia   . Myocardial infarction ([place])    Anterolateral with VF arrest 7/12  . Type 2 diabetes mellitus ([place])     Past Surgical History:  Procedure Laterality Date  . CARDIAC CATHETERIZATION  2012  . CAROTID STENT     stents x 2   . CHOLECYSTECTOMY N/A 12/04/2015   Procedure: LAPAROSCOPIC CHOLECYSTECTOMY;  Surgeon: [name] Signs, MD;  Location: [facility];  Service:  General;  Laterality: N/A;  . CHOLECYSTECTOMY, LAPAROSCOPIC  12/05/2015  . CORONARY ANGIOPLASTY  2012   STENT X 1 2012, STENT X 1 YRS BEFORE  . HYDROCELE EXCISION Bilateral 06/02/2017   Procedure: HYDROCELECTOMY ADULT;  Surgeon: [name] Mons, MD;  Location: [facility];  Service: Urology;  Laterality: Bilateral;  ONLY NEEDS 45 MIN  . INGUINAL HERNIA REPAIR     RIGHT GROIN  . TRIGGER FINGER RELEASE Right 01/08/2015   Procedure: RELEASE TRIGGER FINGER/A-1 PULLEY RIGHT MIDDLE FINGER;  Surgeon: [name] Brod, MD;  Location: [facility];  Service: Orthopedics;  Laterality: Right;    Current Outpatient Medications  Medication Sig Dispense Refill  . aspirin 81 MG tablet Take 81 mg by mouth daily.      Marland Kitchen atorvastatin (LIPITOR) 10 MG tablet Take 1 tablet (10 mg total) by mouth daily. 90 tablet 1  . blood glucose meter kit and supplies Dispense based on patient and insurance preference. Use up to four times daily as directed. (FOR ICD-10 E10.9, E11.9). 1 each 0  . carvedilol (COREG) 12.5 MG tablet Take 1 tablet (12.5 mg total) by mouth 2 (two) times daily with a meal. 180 tablet 1  . CINNAMON PO Take 1,000 mg by mouth 2 (two) times daily.     Marland Kitchen CRANBERRY PO Take 4,200 mg by mouth 2 (two) times daily.     Marland Kitchen ibuprofen (ADVIL,MOTRIN) 200 MG tablet Take 600 mg by mouth every 6 (six) hours as needed  for headache or moderate pain.     . Insulin Glargine (LANTUS SOLOSTAR) 100 UNIT/ML Solostar Pen 40u qam and 70 u in evening 30 pen 5  . Lancets (ONETOUCH ULTRASOFT) lancets Use as instructed twice a day 100 each 0  . liraglutide (VICTOZA) 18 MG/3ML SOPN Inject 0.3 mLs (1.8 mg total) into the skin daily. 3 pen 5  . lisinopril (ZESTRIL) 40 MG tablet TAKE (1/2) TABLET BY MOUTH 2 TIMES A DAY. 30 tablet 6  . loratadine (CLARITIN) 10 MG tablet Take by mouth.    . metFORMIN (GLUCOPHAGE) 500 MG tablet Take 2 tablets (1,000 mg total) by mouth 2 (two) times daily with a meal. 120 tablet 0  .  nitroGLYCERIN (NITROSTAT) 0.4 MG SL tablet Place 1 tablet (0.4 mg total) under the tongue every 5 (five) minutes as needed for chest pain. 25 tablet 3  . pantoprazole (PROTONIX) 40 MG tablet Take 1 tablet (40 mg total) by mouth daily as needed (for acid reflux). 90 tablet 1  . prasugrel (EFFIENT) 10 MG TABS tablet Take 10 mg by mouth daily    . tamsulosin (FLOMAX) 0.4 MG CAPS capsule Take 1 capsule (0.4 mg total) by mouth at bedtime. 90 capsule 1  . triamcinolone (NASACORT ALLERGY 24HR) 55 MCG/ACT AERO nasal inhaler Place 2 sprays into the nose daily as needed (for allergies).     No current facility-administered medications for this visit.   Allergies:  Patient has no known allergies.   ROS:  Left knee pain and mild left ankle swelling.  Physical Exam: VS:  BP 134/78   Pulse 79   Temp (!) 97.1 F (36.2 C)   Ht '5\' 9"'  (1.753 m)   Wt 236 lb (107 kg)   SpO2 96%   BMI 34.85 kg/m , BMI Body mass index is 34.85 kg/m.  Wt Readings from Last 3 Encounters:  05/16/19 236 lb (107 kg)  04/20/19 239 lb (108.4 kg)  04/06/19 247 lb (112 kg)    General: Patient appears comfortable at rest. HEENT: Conjunctiva and lids normal, wearing a mask. Neck: Supple, no elevated JVP or carotid bruits, no thyromegaly. Lungs: Clear to auscultation, nonlabored breathing at rest. Cardiac: Regular rate and rhythm, no S3 or significant systolic murmur, no pericardial rub. Abdomen: Soft, bowel sounds present. Extremities: No pitting edema, distal pulses 2+. Skin: Warm and dry. Musculoskeletal: No kyphosis. Neuropsychiatric: Alert and oriented x3, affect grossly appropriate.  ECG:  An ECG dated 05/06/2018 was personally reviewed today and demonstrated:  Normal sinus rhythm.  Recent Labwork: 04/20/2019: ALT 26; AST 19; BUN 22; Creatinine, Ser 1.05; Potassium 5.0; Sodium 142     Component Value Date/Time   CHOL 94 (L) 04/20/2019 0836   TRIG 158 (H) 04/20/2019 0836   HDL 22 (L) 04/20/2019 0836   CHOLHDL 4.3  04/20/2019 0836   CHOLHDL 3.9 04/11/2014 0709   VLDL 29 04/11/2014 0709   LDLCALC 45 04/20/2019 0836    Other Studies Reviewed Today:  [facility] Myoview 04/29/2017:  No diagnostic ST segment changes to indicate ischemia. Frequent PVCs.  Minor, mild intensity, apical inferior defect that is partially reversible and consistent with a very small ischemic territory.  This is a low risk study based on perfusion imaging.  Nuclear stress EF: 39%. Visually LVEF looks to be normal - suggest echocardiogram to confirm.  Echocardiogram 05/09/2018: 1. The left ventricle has normal systolic function of 09-81%. The cavity  size was normal. There is moderately increased left ventricular wall  thickness. Echo evidence  of impaired diastolic relaxation Elevated mean  left atrial pressure.  2. The right ventricle has normal systolic function. The cavity was  normal. There is no increase in right ventricular wall thickness.  3. The mitral valve is normal in structure. There is mild thickening. No  evidence of mitral valve stenosis.  4. The tricuspid valve is normal in structure.  5. The aortic valve is tricuspid There is mild thickening of the aortic  valve.  6. The aortic root is normal in size and structure.  7. There is mild dilatation of the ascending aorta.  8. The interatrial septum was not well visualized.   Assessment and Plan:  1.  CAD with history of DES to the circumflex in 2005 as well as DES to the LAD/diagonal in 2012.  He reports no worsening angina on medical therapy with current level of activity.  Plan is to continue aspirin, Effient, Coreg, lisinopril, and Lipitor.  Refill provided for as needed use of nitroglycerin.  No clear indication for follow-up ischemic testing at this time.  Myoview from 2019 is reviewed above.  He did have a follow-up echocardiogram last year which shows normal LVEF at 60 to 65%.  2.  Mixed hyperlipidemia, he continues on Lipitor with recent LDL 45.   No changes made today.  3.  Essential hypertension, systolic is in the 446X today.  Continue Coreg and lisinopril.  Aortic root mildly dilated by echocardiogram, continue to focus on blood pressure control.  4.  Type 2 diabetes mellitus, now following with [facility].  Recent hemoglobin A1c 7%.  Medication Adjustments/Labs and Tests Ordered: Current medicines are reviewed at length with the patient today.  Concerns regarding medicines are outlined above.   Tests Ordered: Orders Placed This Encounter  Procedures  . EKG 12-Lead    Medication Changes: Meds ordered this encounter  Medications  . nitroGLYCERIN (NITROSTAT) 0.4 MG SL tablet    Sig: Place 1 tablet (0.4 mg total) under the tongue every 5 (five) minutes as needed for chest pain.    Dispense:  25 tablet    Refill:  3    Disposition:  Follow up 1 year in the [place] office.  Signed, Satira Sark, MD, [facility] 05/16/2019 1:23 PM    [facility] [address], [place], [place] [address] Phone: [phone]; Fax: [phone]

## 2019-05-16 NOTE — Telephone Encounter (Signed)
Called Patient he doesn't have to stop it

## 2019-05-17 ENCOUNTER — Other Ambulatory Visit: Payer: Self-pay

## 2019-05-17 ENCOUNTER — Encounter (HOSPITAL_BASED_OUTPATIENT_CLINIC_OR_DEPARTMENT_OTHER): Payer: Self-pay | Admitting: Orthopedic Surgery

## 2019-05-19 ENCOUNTER — Other Ambulatory Visit (HOSPITAL_COMMUNITY): Payer: Medicare PPO

## 2019-05-19 ENCOUNTER — Other Ambulatory Visit: Payer: Self-pay

## 2019-05-19 ENCOUNTER — Other Ambulatory Visit (HOSPITAL_COMMUNITY)
Admission: RE | Admit: 2019-05-19 | Discharge: 2019-05-19 | Disposition: A | Discharge status: Home / Self Care | Payer: Medicare PPO | Source: Ambulatory Visit | Attending: Orthopedic Surgery | Admitting: Orthopedic Surgery

## 2019-05-19 DIAGNOSIS — Z01812 Encounter for preprocedural laboratory examination: Secondary | ICD-10-CM | POA: Diagnosis present

## 2019-05-19 DIAGNOSIS — Z20822 Contact with and (suspected) exposure to covid-19: Secondary | ICD-10-CM | POA: Diagnosis not present

## 2019-05-19 LAB — SARS CORONAVIRUS 2 (TAT 6-24 HRS): SARS Coronavirus 2: NEGATIVE

## 2019-05-22 NOTE — Anesthesia Preprocedure Evaluation (Addendum)
Anesthesia Evaluation  Patient identified by MRN, date of birth, ID band Patient awake    Reviewed: Allergy & Precautions, NPO status , Patient's Chart, lab work & pertinent test results, reviewed documented beta blocker date and time   History of Anesthesia Complications Negative for: history of anesthetic complications  Airway Mallampati: III  TM Distance: >3 FB Neck ROM: Full    Dental no notable dental hx.    Pulmonary neg pulmonary ROS,    Pulmonary exam normal        Cardiovascular hypertension, Pt. on home beta blockers and Pt. on medications + CAD, + Past MI (s/p VF arrest 2012) and + Cardiac Stents (2012)  Normal cardiovascular exam  TTE 04/2018: 60-65%, impaired diastolic relaxation, mild dilatation of the ascending aorta   Neuro/Psych Anxiety negative neurological ROS     GI/Hepatic Neg liver ROS, GERD  Medicated and Controlled,  Endo/Other  diabetes, Type 2, Insulin Dependent, Oral Hypoglycemic Agents  Renal/GU negative Renal ROS  negative genitourinary   Musculoskeletal negative musculoskeletal ROS (+)   Abdominal   Peds  Hematology negative hematology ROS (+)   Anesthesia Other Findings Day of surgery medications reviewed with patient.  Reproductive/Obstetrics negative OB ROS                            Anesthesia Physical Anesthesia Plan  ASA: III  Anesthesia Plan: General   Post-op Pain Management:    Induction: Intravenous  PONV Risk Score and Plan: 3 and Treatment may vary due to age or medical condition, Ondansetron, Dexamethasone and Midazolam  Airway Management Planned: LMA  Additional Equipment: None  Intra-op Plan:   Post-operative Plan: Extubation in OR  Informed Consent: I have reviewed the patients History and Physical, chart, labs and discussed the procedure including the risks, benefits and alternatives for the proposed anesthesia with the patient or  authorized representative who has indicated his/her understanding and acceptance.     Dental advisory given  Plan Discussed with: CRNA  Anesthesia Plan Comments:        Anesthesia Quick Evaluation

## 2019-05-23 ENCOUNTER — Ambulatory Visit (HOSPITAL_BASED_OUTPATIENT_CLINIC_OR_DEPARTMENT_OTHER): Payer: Medicare PPO | Admitting: Anesthesiology

## 2019-05-23 ENCOUNTER — Ambulatory Visit (HOSPITAL_BASED_OUTPATIENT_CLINIC_OR_DEPARTMENT_OTHER)
Admission: RE | Admit: 2019-05-23 | Discharge: 2019-05-23 | Disposition: A | Discharge status: Home / Self Care | Payer: Medicare PPO | Attending: Orthopedic Surgery | Admitting: Orthopedic Surgery

## 2019-05-23 ENCOUNTER — Encounter (HOSPITAL_BASED_OUTPATIENT_CLINIC_OR_DEPARTMENT_OTHER): Payer: Self-pay | Admitting: Orthopedic Surgery

## 2019-05-23 ENCOUNTER — Encounter (HOSPITAL_BASED_OUTPATIENT_CLINIC_OR_DEPARTMENT_OTHER)
Admission: RE | Disposition: A | Discharge status: Home / Self Care | Payer: Self-pay | Source: Home / Self Care | Attending: Orthopedic Surgery

## 2019-05-23 DIAGNOSIS — S83232A Complex tear of medial meniscus, current injury, left knee, initial encounter: Secondary | ICD-10-CM | POA: Insufficient documentation

## 2019-05-23 DIAGNOSIS — Z794 Long term (current) use of insulin: Secondary | ICD-10-CM | POA: Insufficient documentation

## 2019-05-23 DIAGNOSIS — Z8674 Personal history of sudden cardiac arrest: Secondary | ICD-10-CM | POA: Insufficient documentation

## 2019-05-23 DIAGNOSIS — I2510 Atherosclerotic heart disease of native coronary artery without angina pectoris: Secondary | ICD-10-CM | POA: Diagnosis not present

## 2019-05-23 DIAGNOSIS — I10 Essential (primary) hypertension: Secondary | ICD-10-CM | POA: Insufficient documentation

## 2019-05-23 DIAGNOSIS — I252 Old myocardial infarction: Secondary | ICD-10-CM | POA: Diagnosis not present

## 2019-05-23 DIAGNOSIS — M23204 Derangement of unspecified medial meniscus due to old tear or injury, left knee: Secondary | ICD-10-CM | POA: Diagnosis not present

## 2019-05-23 DIAGNOSIS — Z955 Presence of coronary angioplasty implant and graft: Secondary | ICD-10-CM | POA: Diagnosis not present

## 2019-05-23 DIAGNOSIS — Z7902 Long term (current) use of antithrombotics/antiplatelets: Secondary | ICD-10-CM | POA: Insufficient documentation

## 2019-05-23 DIAGNOSIS — Z7982 Long term (current) use of aspirin: Secondary | ICD-10-CM | POA: Insufficient documentation

## 2019-05-23 DIAGNOSIS — E782 Mixed hyperlipidemia: Secondary | ICD-10-CM | POA: Diagnosis not present

## 2019-05-23 DIAGNOSIS — M21962 Unspecified acquired deformity of left lower leg: Secondary | ICD-10-CM | POA: Insufficient documentation

## 2019-05-23 DIAGNOSIS — Z79899 Other long term (current) drug therapy: Secondary | ICD-10-CM | POA: Insufficient documentation

## 2019-05-23 DIAGNOSIS — E119 Type 2 diabetes mellitus without complications: Secondary | ICD-10-CM | POA: Diagnosis not present

## 2019-05-23 DIAGNOSIS — M23201 Derangement of unspecified lateral meniscus due to old tear or injury, left knee: Secondary | ICD-10-CM

## 2019-05-23 DIAGNOSIS — X58XXXA Exposure to other specified factors, initial encounter: Secondary | ICD-10-CM | POA: Diagnosis not present

## 2019-05-23 DIAGNOSIS — K219 Gastro-esophageal reflux disease without esophagitis: Secondary | ICD-10-CM | POA: Diagnosis not present

## 2019-05-23 HISTORY — PX: KNEE ARTHROSCOPY: SHX127

## 2019-05-23 LAB — GLUCOSE, CAPILLARY
Glucose-Capillary: 102 mg/dL — ABNORMAL HIGH (ref 70–99)
Glucose-Capillary: 99 mg/dL (ref 70–99)

## 2019-05-23 SURGERY — ARTHROSCOPY, KNEE
Anesthesia: General | Site: Knee | Laterality: Left

## 2019-05-23 MED ORDER — BUPIVACAINE HCL (PF) 0.5 % IJ SOLN
INTRAMUSCULAR | Status: DC | PRN
  Administered 2019-05-23: 20 mL

## 2019-05-23 MED ORDER — CEFAZOLIN SODIUM-DEXTROSE 2-4 GM/100ML-% IV SOLN
2.0000 g | INTRAVENOUS | Status: AC
  Administered 2019-05-23: 08:00:00 2 g via INTRAVENOUS

## 2019-05-23 MED ORDER — PROPOFOL 500 MG/50ML IV EMUL
INTRAVENOUS | Status: AC
  Filled 2019-05-23: qty 50

## 2019-05-23 MED ORDER — LIDOCAINE 2% (20 MG/ML) 5 ML SYRINGE
INTRAMUSCULAR | Status: AC
  Filled 2019-05-23: qty 5

## 2019-05-23 MED ORDER — FENTANYL CITRATE (PF) 100 MCG/2ML IJ SOLN
INTRAMUSCULAR | Status: DC | PRN
  Administered 2019-05-23: 50 ug via INTRAVENOUS

## 2019-05-23 MED ORDER — MIDAZOLAM HCL 2 MG/2ML IJ SOLN
INTRAMUSCULAR | Status: AC
  Filled 2019-05-23: qty 2

## 2019-05-23 MED ORDER — LIDOCAINE 2% (20 MG/ML) 5 ML SYRINGE
INTRAMUSCULAR | Status: DC | PRN
  Administered 2019-05-23: 100 mg via INTRAVENOUS

## 2019-05-23 MED ORDER — CEFAZOLIN SODIUM-DEXTROSE 2-4 GM/100ML-% IV SOLN
INTRAVENOUS | Status: AC
  Filled 2019-05-23: qty 100

## 2019-05-23 MED ORDER — ACETAMINOPHEN 500 MG PO TABS
ORAL_TABLET | ORAL | Status: AC
  Filled 2019-05-23: qty 2

## 2019-05-23 MED ORDER — ACETAMINOPHEN 500 MG PO TABS
1000.0000 mg | ORAL_TABLET | Freq: Once | ORAL | Status: AC
  Administered 2019-05-23: 07:00:00 1000 mg via ORAL

## 2019-05-23 MED ORDER — CHLORHEXIDINE GLUCONATE 4 % EX LIQD: 60.0000 mL | Freq: Once | CUTANEOUS | Status: DC

## 2019-05-23 MED ORDER — PHENYLEPHRINE 40 MCG/ML (10ML) SYRINGE FOR IV PUSH (FOR BLOOD PRESSURE SUPPORT)
PREFILLED_SYRINGE | INTRAVENOUS | Status: DC | PRN
  Administered 2019-05-23: 120 ug via INTRAVENOUS
  Administered 2019-05-23: 80 ug via INTRAVENOUS

## 2019-05-23 MED ORDER — BUPIVACAINE HCL (PF) 0.5 % IJ SOLN
INTRAMUSCULAR | Status: AC
  Filled 2019-05-23: qty 30

## 2019-05-23 MED ORDER — DEXAMETHASONE SODIUM PHOSPHATE 10 MG/ML IJ SOLN
INTRAMUSCULAR | Status: DC | PRN
  Administered 2019-05-23: 5 mg via INTRAVENOUS

## 2019-05-23 MED ORDER — ONDANSETRON HCL 4 MG/2ML IJ SOLN
INTRAMUSCULAR | Status: DC | PRN
  Administered 2019-05-23: 4 mg via INTRAVENOUS

## 2019-05-23 MED ORDER — MIDAZOLAM HCL 2 MG/2ML IJ SOLN
1.0000 mg | INTRAMUSCULAR | Status: DC | PRN
  Administered 2019-05-23: 2 mg via INTRAVENOUS

## 2019-05-23 MED ORDER — KETOROLAC TROMETHAMINE 30 MG/ML IJ SOLN
INTRAMUSCULAR | Status: DC | PRN
  Administered 2019-05-23: 30 mg via INTRAVENOUS

## 2019-05-23 MED ORDER — PROPOFOL 10 MG/ML IV BOLUS
INTRAVENOUS | Status: DC | PRN
  Administered 2019-05-23: 170 mg via INTRAVENOUS

## 2019-05-23 MED ORDER — HYDROCODONE-ACETAMINOPHEN 5-325 MG PO TABS: 1.0000 | ORAL_TABLET | ORAL | 0 refills | Status: DC | PRN

## 2019-05-23 MED ORDER — ONDANSETRON HCL 4 MG/2ML IJ SOLN
INTRAMUSCULAR | Status: AC
  Filled 2019-05-23: qty 2

## 2019-05-23 MED ORDER — FENTANYL CITRATE (PF) 100 MCG/2ML IJ SOLN
INTRAMUSCULAR | Status: AC
  Filled 2019-05-23: qty 2

## 2019-05-23 MED ORDER — LACTATED RINGERS IV SOLN: INTRAVENOUS | Status: DC

## 2019-05-23 MED ORDER — FENTANYL CITRATE (PF) 100 MCG/2ML IJ SOLN: 50.0000 ug | INTRAMUSCULAR | Status: DC | PRN

## 2019-05-23 SURGICAL SUPPLY — 31 items
BNDG COHESIVE 6X5 TAN STRL LF (GAUZE/BANDAGES/DRESSINGS) ×3 IMPLANT
COVER WAND RF STERILE (DRAPES) IMPLANT
DISSECTOR 4.0MM X 13CM (MISCELLANEOUS) IMPLANT
DRAPE ARTHROSCOPY W/POUCH 90 (DRAPES) ×3 IMPLANT
DRAPE U-SHAPE 47X51 STRL (DRAPES) ×3 IMPLANT
DRSG EMULSION OIL 3X3 NADH (GAUZE/BANDAGES/DRESSINGS) ×3 IMPLANT
DURAPREP 26ML APPLICATOR (WOUND CARE) ×3 IMPLANT
EXCALIBUR 3.8MM X 13CM (MISCELLANEOUS) ×3 IMPLANT
GAUZE SPONGE 4X4 12PLY STRL (GAUZE/BANDAGES/DRESSINGS) ×3 IMPLANT
GLOVE BIO SURGEON STRL SZ 6.5 (GLOVE) ×1 IMPLANT
GLOVE BIO SURGEONS STRL SZ 6.5 (GLOVE) ×1
GLOVE BIOGEL PI IND STRL 7.0 (GLOVE) IMPLANT
GLOVE BIOGEL PI IND STRL 9 (GLOVE) ×1 IMPLANT
GLOVE BIOGEL PI INDICATOR 7.0 (GLOVE) ×4
GLOVE BIOGEL PI INDICATOR 9 (GLOVE) ×2
GLOVE SURG ORTHO 9.0 STRL STRW (GLOVE) ×3 IMPLANT
GOWN STRL REUS W/ TWL LRG LVL3 (GOWN DISPOSABLE) ×1 IMPLANT
GOWN STRL REUS W/ TWL XL LVL3 (GOWN DISPOSABLE) ×1 IMPLANT
GOWN STRL REUS W/TWL LRG LVL3 (GOWN DISPOSABLE) ×3
GOWN STRL REUS W/TWL XL LVL3 (GOWN DISPOSABLE) ×3
KNEE WRAP E Z 3 GEL PACK (MISCELLANEOUS) IMPLANT
MANIFOLD NEPTUNE II (INSTRUMENTS) IMPLANT
NDL SAFETY ECLIPSE 18X1.5 (NEEDLE) ×1 IMPLANT
NEEDLE HYPO 18GX1.5 SHARP (NEEDLE) ×3
PACK ARTHROSCOPY DSU (CUSTOM PROCEDURE TRAY) ×3 IMPLANT
PACK BASIN DAY SURGERY FS (CUSTOM PROCEDURE TRAY) ×3 IMPLANT
PORT APPOLLO RF 90DEGREE MULTI (SURGICAL WAND) ×3 IMPLANT
PROBE APOLLO 90XL (SURGICAL WAND) IMPLANT
SUT ETHILON 2 0 FSLX (SUTURE) ×3 IMPLANT
TOWEL GREEN STERILE FF (TOWEL DISPOSABLE) ×3 IMPLANT
TUBING ARTHROSCOPY IRRIG 16FT (MISCELLANEOUS) ×3 IMPLANT

## 2019-05-23 NOTE — Discharge Instructions (Signed)

## 2019-05-23 NOTE — Anesthesia Postprocedure Evaluation (Signed)
Anesthesia Post Note  Patient: [name] [name]  Procedure(s) Performed: LEFT KNEE ARTHROSCOPY AND DEBRIDEMENT PARTIAL MEDIAL MENISECTOMY (Left Knee)     Patient location during evaluation: PACU Anesthesia Type: General Level of consciousness: awake and alert and oriented Pain management: pain level controlled Vital Signs Assessment: post-procedure vital signs reviewed and stable Respiratory status: spontaneous breathing, nonlabored ventilation and respiratory function stable Cardiovascular status: blood pressure returned to baseline Postop Assessment: no apparent nausea or vomiting Anesthetic complications: no    Last Vitals:  Vitals:   05/23/19 0830 05/23/19 0845  BP: 127/76 (!) 142/81  Pulse: 95 97  Resp: 16 16  Temp:  36.9 C  SpO2: 98% 94%    Last Pain:  Vitals:   05/23/19 0845  TempSrc:   PainSc: 0-No pain                 Brennan Bailey

## 2019-05-23 NOTE — Anesthesia Procedure Notes (Signed)
Procedure Name: LMA Insertion Date/Time: 05/23/2019 7:30 AM Performed by: Gwyndolyn Saxon, CRNA Pre-anesthesia Checklist: Patient identified, Emergency Drugs available, Suction available and Patient being monitored Patient Re-evaluated:Patient Re-evaluated prior to induction Oxygen Delivery Method: Circle system utilized Preoxygenation: Pre-oxygenation with 100% oxygen Induction Type: IV induction Ventilation: Mask ventilation without difficulty and Oral airway inserted - appropriate to patient size LMA: LMA inserted LMA Size: 4.0 Number of attempts: 1 Placement Confirmation: positive ETCO2 and breath sounds checked- equal and bilateral Tube secured with: Tape Dental Injury: Teeth and Oropharynx as per pre-operative assessment

## 2019-05-23 NOTE — Transfer of Care (Signed)
Immediate Anesthesia Transfer of Care Note  Patient: [name] [name]  Procedure(s) Performed: LEFT KNEE ARTHROSCOPY AND DEBRIDEMENT PARTIAL MEDIAL MENISECTOMY (Left Knee)  Patient Location: PACU  Anesthesia Type:General  Level of Consciousness: drowsy  Airway & Oxygen Therapy: Patient Spontanous Breathing and Patient connected to face mask oxygen  Post-op Assessment: Report given to RN and Post -op Vital signs reviewed and stable  Post vital signs: Reviewed and stable  Last Vitals:  Vitals Value Taken Time  BP 111/78 05/23/19 0811  Temp    Pulse 98 05/23/19 0814  Resp 20 05/23/19 0814  SpO2 95 % 05/23/19 0814  Vitals shown include unvalidated device data.  Last Pain:  Vitals:   05/23/19 0656  TempSrc: Temporal  PainSc: 0-No pain         Complications: No apparent anesthesia complications

## 2019-05-23 NOTE — Op Note (Signed)
05/23/2019  8:22 AM  PATIENT:  [name] [name]    PRE-OPERATIVE DIAGNOSIS:  Medial Meniscal Tear Left Knee  POST-OPERATIVE DIAGNOSIS: Complex tear of the medial meniscus with peripheral tear of the lateral meniscus  PROCEDURE:  LEFT KNEE ARTHROSCOPY AND DEBRIDEMENT PARTIAL MEDIAL MENISECTOMY, and partial lateral meniscectomy  SURGEON:  Newt Minion, MD  PHYSICIAN ASSISTANT:None ANESTHESIA:   General  PREOPERATIVE INDICATIONS:  [name] [name] is a  64 y.o. male with a diagnosis of Medial Meniscal Tear Left Knee who failed conservative measures and elected for surgical management.    The risks benefits and alternatives were discussed with the patient preoperatively including but not limited to the risks of infection, bleeding, nerve injury, cardiopulmonary complications, the need for revision surgery, among others, and the patient was willing to proceed.  OPERATIVE IMPLANTS: None  @ENCIMAGES @  OPERATIVE FINDINGS: Complex tear of the medial meniscus with osteochondral defect of the medial femoral condyle and a peripheral tear of the lateral meniscus.  OPERATIVE PROCEDURE: Patient was brought the operating room and underwent general anesthetic.  After adequate levels anesthesia were obtained patient's left lower extremity was prepped using DuraPrep draped into a sterile field a timeout was called.  The scope was inserted from the anterior lateral portal anterior medial working portal was established.  With valgus stress patient had a large complex tear of the medial meniscus.  Using the shaver this was debrided back to a stable viable edge.  Patient also osteochondral defect of the medial femoral condyle and this was shaved back to stable viable margins.  Examination the notch showed intact ACL and PCL.  Examination the lateral joint line showed good articular cartilage with a peripheral degenerative lateral meniscal tear.  The shaver was used to debride the lateral meniscal tear.  Patient had  synovitis anteriorly and this was debrided back.  With the knee extended patient had a plica which was excised.  The patellofemoral joint had good articular cartilage.  Further meniscectomy was performed.  A survey was then again performed there were no loose bodies.  The instruments were removed the portals were closed using 2-0 nylon and the joint was infused with 20 cc of quarter percent Marcaine plain sterile dressing was applied patient was extubated taken the PACU in stable condition.   DISCHARGE PLANNING:  Antibiotic duration: Preoperative antibiotics  Weightbearing: Weightbearing as tolerated  Pain medication: Prescription for Vicodin  Dressing care/ Wound VAC: Remove dressing in 2 days  Ambulatory devices: Crutches  Discharge to: Home.  Follow-up: In the office 1 week post operative.

## 2019-05-23 NOTE — H&P (Signed)
[name] [name] is an 64 y.o. male.   Chief Complaint: Mechanical symptoms left knee with recurrent swelling HPI: Patient is a 64 year old gentleman with mechanical symptoms of his left knee.  He has failed conservative care has had a MRI scan which shows a complex tear of the medial meniscus.  Past Medical History:  Diagnosis Date  . Anxiety   . Coronary atherosclerosis of native coronary artery    DES distal circumflex 2005, DES LAD/diagonal bifurcation 7/12  . Essential hypertension   . GERD (gastroesophageal reflux disease)   . History of kidney stones   . Mixed hyperlipidemia   . Myocardial infarction ([place])    Anterolateral with VF arrest 7/12  . Type 2 diabetes mellitus ([place])     Past Surgical History:  Procedure Laterality Date  . CARDIAC CATHETERIZATION  2012  . CAROTID STENT     stents x 2   . CHOLECYSTECTOMY N/A 12/04/2015   Procedure: LAPAROSCOPIC CHOLECYSTECTOMY;  Surgeon: Aviva Signs, MD;  Location: [facility];  Service: General;  Laterality: N/A;  . CHOLECYSTECTOMY, LAPAROSCOPIC  12/05/2015  . CORONARY ANGIOPLASTY  2012   STENT X 1 2012, STENT X 1 YRS BEFORE  . HYDROCELE EXCISION Bilateral 06/02/2017   Procedure: HYDROCELECTOMY ADULT;  Surgeon: Ceasar Mons, MD;  Location: [facility];  Service: Urology;  Laterality: Bilateral;  ONLY NEEDS 45 MIN  . INGUINAL HERNIA REPAIR     RIGHT GROIN  . TRIGGER FINGER RELEASE Right 01/08/2015   Procedure: RELEASE TRIGGER FINGER/A-1 PULLEY RIGHT MIDDLE FINGER;  Surgeon: Daryll Brod, MD;  Location: [facility];  Service: Orthopedics;  Laterality: Right;    Family History  Problem Relation Age of Onset  . Hyperlipidemia Mother   . Hypertension Mother   . Hyperlipidemia Father   . Hypertension Father   . Hypertension Other    Social History:  reports that he has never smoked. He has never used smokeless tobacco. He reports that he does not drink alcohol or use drugs.  Allergies: No Known  Allergies  Medications Prior to Admission  Medication Sig Dispense Refill  . aspirin 81 MG tablet Take 81 mg by mouth daily.      Marland Kitchen atorvastatin (LIPITOR) 10 MG tablet Take 1 tablet (10 mg total) by mouth daily. 90 tablet 1  . carvedilol (COREG) 12.5 MG tablet Take 1 tablet (12.5 mg total) by mouth 2 (two) times daily with a meal. 180 tablet 1  . CINNAMON PO Take 1,000 mg by mouth 2 (two) times daily.     Marland Kitchen CRANBERRY PO Take 4,200 mg by mouth 2 (two) times daily.     Marland Kitchen ibuprofen (ADVIL,MOTRIN) 200 MG tablet Take 600 mg by mouth every 6 (six) hours as needed for headache or moderate pain.     Marland Kitchen insulin aspart (NOVOLOG) 100 UNIT/ML injection Inject into the skin 3 (three) times daily before meals. SLIDING SCALE 5-45UNITS    . Insulin Glargine (LANTUS SOLOSTAR) 100 UNIT/ML Solostar Pen 40u qam and 70 u in evening 30 pen 5  . liraglutide (VICTOZA) 18 MG/3ML SOPN Inject 0.3 mLs (1.8 mg total) into the skin daily. 3 pen 5  . lisinopril (ZESTRIL) 40 MG tablet TAKE (1/2) TABLET BY MOUTH 2 TIMES A DAY. 30 tablet 6  . loratadine (CLARITIN) 10 MG tablet Take by mouth.    . metFORMIN (GLUCOPHAGE) 500 MG tablet Take 2 tablets (1,000 mg total) by mouth 2 (two) times daily with a meal. 120 tablet 0  . pantoprazole (  PROTONIX) 40 MG tablet Take 1 tablet (40 mg total) by mouth daily as needed (for acid reflux). 90 tablet 1  . prasugrel (EFFIENT) 10 MG TABS tablet Take 10 mg by mouth daily    . tamsulosin (FLOMAX) 0.4 MG CAPS capsule Take 1 capsule (0.4 mg total) by mouth at bedtime. 90 capsule 1  . triamcinolone (NASACORT ALLERGY 24HR) 55 MCG/ACT AERO nasal inhaler Place 2 sprays into the nose daily as needed (for allergies).    . blood glucose meter kit and supplies Dispense based on patient and insurance preference. Use up to four times daily as directed. (FOR ICD-10 E10.9, E11.9). 1 each 0  . Lancets (ONETOUCH ULTRASOFT) lancets Use as instructed twice a day 100 each 0  . nitroGLYCERIN (NITROSTAT) 0.4 MG SL  tablet Place 1 tablet (0.4 mg total) under the tongue every 5 (five) minutes as needed for chest pain. 25 tablet 3    No results found for this or any previous visit (from the past 48 hour(s)). No results found.  Review of Systems  All other systems reviewed and are negative.   Blood pressure (!) 149/76, pulse 96, temperature (!) 96.9 F (36.1 C), temperature source Temporal, height '5\' 9"'  (1.753 m), weight 106.8 kg, SpO2 97 %. Physical Exam  Patient is alert oriented no adenopathy well-dressed normal affect normal respiratory effort he has an effusion of the left knee tender to palpation over the medial joint line primarily.  Collaterals and cruciates are stable.  Review of the MRI scan shows a complex tear of the medial meniscus as well as a Baker's cyst. Assessment/Plan Assessment: Complex tear medial meniscus left knee with mechanical symptoms.  Plan.  We will plan for left knee arthroscopy with meniscal debridement.  Risk and benefits were discussed including persistent pain neurovascular injury need for additional surgery.  Patient states he understands wished to proceed at this time.  [name] Minion, MD 05/23/2019, 6:59 AM

## 2019-05-24 ENCOUNTER — Encounter: Payer: Self-pay | Admitting: *Deleted

## 2019-05-30 ENCOUNTER — Encounter: Payer: Self-pay | Admitting: Orthopedic Surgery

## 2019-05-30 ENCOUNTER — Other Ambulatory Visit: Payer: Self-pay

## 2019-05-30 ENCOUNTER — Ambulatory Visit: Payer: Medicare PPO | Admitting: Orthopedic Surgery

## 2019-05-30 DIAGNOSIS — M23222 Derangement of posterior horn of medial meniscus due to old tear or injury, left knee: Secondary | ICD-10-CM

## 2019-05-30 NOTE — Progress Notes (Signed)
Post-Op Visit Note   Patient: [name] [name]           Date of Birth: 19-Aug-1955           MRN: [id number] Visit Date: 05/30/2019 PCP: Chevis Pretty, FNP  Chief Complaint:  Chief Complaint  Patient presents with  . Left Knee - Routine Post Op    HPI:  HPI The patient is a 64 year old gentleman seen today status post left knee arthroscopy with debridement of a medial meniscal tear on February 23 he has been having some tightness and pain in his lateral thigh overall doing quite well he has been showering and doing his activities of daily living with minimal pain. Ortho Exam Portals are well-healed he has full range of motion of his knee there is very minimal swelling no ecchymosis moderate swelling to his thigh  Visit Diagnoses: No diagnosis found.  Plan: Sutures harvested without incident he may continue to advance his activities as tolerated given recommendation for quad strengthening he will follow-up in office in 3 weeks  Follow-Up Instructions: No follow-ups on file.   Imaging: No results found.  Orders:  No orders of the defined types were placed in this encounter.  No orders of the defined types were placed in this encounter.    PMFS History: Patient Active Problem List   Diagnosis Date Noted  . 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 Old peripheral tear of lateral meniscus of left knee   . Palpitations 11/26/2010  . Coronary atherosclerosis of native coronary artery 10/23/2010  . Essential hypertension, benign 10/23/2010  . Mixed hyperlipidemia 10/23/2010   Past Medical History:  Diagnosis Date  . Anxiety   . Coronary atherosclerosis of native coronary artery    DES distal circumflex 2005, DES LAD/diagonal bifurcation 7/12  . Essential hypertension   . GERD (gastroesophageal reflux disease)   . History of kidney stones   . Mixed hyperlipidemia   . Myocardial infarction ([place])    Anterolateral with VF arrest 7/12  . Type 2 diabetes  mellitus ([facility])     Family History  Problem Relation Age of Onset  . Hyperlipidemia Mother   . Hypertension Mother   . Hyperlipidemia Father   . Hypertension Father   . Hypertension Other     Past Surgical History:  Procedure Laterality Date  . CARDIAC CATHETERIZATION  2012  . CAROTID STENT     stents x 2   . CHOLECYSTECTOMY N/A 12/04/2015   Procedure: LAPAROSCOPIC CHOLECYSTECTOMY;  Surgeon: Aviva Signs, MD;  Location: [facility];  Service: General;  Laterality: N/A;  . CHOLECYSTECTOMY, LAPAROSCOPIC  12/05/2015  . CORONARY ANGIOPLASTY  2012   STENT X 1 2012, STENT X 1 YRS BEFORE  . HYDROCELE EXCISION Bilateral 06/02/2017   Procedure: HYDROCELECTOMY ADULT;  Surgeon: Ceasar Mons, MD;  [facility];  Service: Urology;  Laterality: Bilateral;  ONLY NEEDS 45 MIN  . INGUINAL HERNIA REPAIR     RIGHT GROIN  . KNEE ARTHROSCOPY Left 05/23/2019   Procedure: LEFT KNEE ARTHROSCOPY AND DEBRIDEMENT PARTIAL MEDIAL MENISECTOMY;  Surgeon: Newt Minion, MD;  Location: [facility];  Service: Orthopedics;  Laterality: Left;  . TRIGGER FINGER RELEASE Right 01/08/2015   Procedure: RELEASE TRIGGER FINGER/A-1 PULLEY RIGHT MIDDLE FINGER;  Surgeon: Daryll Brod, MD;  Location: [facility];  Service: Orthopedics;  Laterality: Right;   Social History   Occupational History  . Occupation: Respiratory Therapy    Employer: [employer]  Tobacco Use  . Smoking status: Never Smoker  . Smokeless tobacco: Never Used  Substance and Sexual Activity  . Alcohol use: No  . Drug use: No  . Sexual activity: Yes

## 2019-06-07 ENCOUNTER — Other Ambulatory Visit: Payer: Self-pay | Admitting: Nurse Practitioner

## 2019-06-12 ENCOUNTER — Other Ambulatory Visit: Payer: Self-pay | Admitting: Nurse Practitioner

## 2019-06-12 ENCOUNTER — Telehealth: Payer: Self-pay | Admitting: *Deleted

## 2019-06-12 DIAGNOSIS — Z794 Long term (current) use of insulin: Secondary | ICD-10-CM

## 2019-06-12 DIAGNOSIS — E1159 Type 2 diabetes mellitus with other circulatory complications: Secondary | ICD-10-CM

## 2019-06-12 MED ORDER — PRASUGREL HCL 10 MG PO TABS: ORAL_TABLET | ORAL | 1 refills | Status: DC

## 2019-06-12 NOTE — Telephone Encounter (Signed)
effient refilled

## 2019-06-20 ENCOUNTER — Encounter: Payer: Self-pay | Admitting: Orthopedic Surgery

## 2019-06-20 ENCOUNTER — Other Ambulatory Visit: Payer: Self-pay

## 2019-06-20 ENCOUNTER — Ambulatory Visit (INDEPENDENT_AMBULATORY_CARE_PROVIDER_SITE_OTHER): Payer: Medicare PPO | Admitting: Orthopedic Surgery

## 2019-06-20 VITALS — Ht 69.0 in | Wt 235.0 lb

## 2019-06-20 DIAGNOSIS — M23222 Derangement of posterior horn of medial meniscus due to old tear or injury, left knee: Secondary | ICD-10-CM

## 2019-06-21 ENCOUNTER — Other Ambulatory Visit: Payer: Self-pay | Admitting: Nurse Practitioner

## 2019-06-21 ENCOUNTER — Telehealth: Payer: Self-pay | Admitting: Nurse Practitioner

## 2019-06-21 ENCOUNTER — Ambulatory Visit (INDEPENDENT_AMBULATORY_CARE_PROVIDER_SITE_OTHER): Payer: Medicare PPO | Admitting: Family Medicine

## 2019-06-21 ENCOUNTER — Encounter: Payer: Self-pay | Admitting: Orthopedic Surgery

## 2019-06-21 DIAGNOSIS — R350 Frequency of micturition: Secondary | ICD-10-CM

## 2019-06-21 MED ORDER — SULFAMETHOXAZOLE-TRIMETHOPRIM 800-160 MG PO TABS: 1.0000 | ORAL_TABLET | Freq: Two times a day (BID) | ORAL | 0 refills | Status: DC

## 2019-06-21 NOTE — Progress Notes (Signed)
Pt states he has already talked with his PCP and resolved his issue. No need for visit today.

## 2019-06-21 NOTE — Telephone Encounter (Signed)
Televisit appt made.  

## 2019-06-21 NOTE — Progress Notes (Signed)
Office Visit Note   Patient: [name] [name]           Date of Birth: 1956/01/22           MRN: [id number] Visit Date: 06/20/2019              Requested by: Chevis Pretty, [address] [place],  [place] [id number] PCP: Chevis Pretty, FNP  Chief Complaint  Patient presents with  . Left Knee - Routine Post Op    05/23/19 left knee scope and debridement  Partial medial menisectomy       HPI: Patient is a 64 year old gentleman who is 4 weeks status post left knee arthroscopy for debridement medial meniscal tear.  Patient states his knee is much better he states he still has some swelling and tightness.  Assessment & Plan: Visit Diagnoses:  1. Derang of post horn of medial mensc d/t old tear/inj, l knee     Plan: Patient will continue with strengthening patient was given instructions for body weight exercises for quad and VMO strengthening.  Follow-Up Instructions: Return in about 4 weeks (around 07/18/2019).   Ortho Exam  Patient is alert, oriented, no adenopathy, well-dressed, normal affect, normal respiratory effort. Examination patient does have some swelling there is no redness he has good range of motion.  Imaging: No results found. No images are attached to the encounter.  Labs: Lab Results  Component Value Date   HGBA1C 7.0 (H) 04/20/2019   HGBA1C 8.7 (H) 09/30/2010     Lab Results  Component Value Date   ALBUMIN 4.3 04/20/2019   ALBUMIN 4.3 11/29/2015   ALBUMIN 4.8 06/08/2014    No results found for: MG No results found for: VD25OH  No results found for: PREALBUMIN CBC EXTENDED Latest Ref Rng & Units 05/31/2017 11/29/2015 01/08/2015  WBC 4.0 - 10.5 K/uL 7.7 6.4 -  RBC 4.22 - 5.81 MIL/uL 5.09 5.15 -  HGB 13.0 - 17.0 g/dL 14.4 14.7 16.3  HCT 39.0 - 52.0 % 44.7 44.5 48.0  PLT 150 - 400 K/uL 280 237 -  NEUTROABS 1.7 - 7.7 K/uL - 3.6 -  LYMPHSABS 0.7 - 4.0 K/uL - 1.9 -     Body mass index is 34.7 kg/m.  Orders:  No orders of  the defined types were placed in this encounter.  No orders of the defined types were placed in this encounter.    Procedures: No procedures performed  Clinical Data: No additional findings.  ROS:  All other systems negative, except as noted in the HPI. Review of Systems  Objective: Vital Signs: Ht 5\' 9"  (1.753 m)   Wt 235 lb (106.6 kg)   BMI 34.70 kg/m   Specialty Comments:  No specialty comments available.  PMFS History: Patient Active Problem List   Diagnosis Date Noted  . 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 Old peripheral tear of lateral meniscus of left knee   . Palpitations 11/26/2010  . Coronary atherosclerosis of native coronary artery 10/23/2010  . Essential hypertension, benign 10/23/2010  . Mixed hyperlipidemia 10/23/2010   Past Medical History:  Diagnosis Date  . Anxiety   . Coronary atherosclerosis of native coronary artery    DES distal circumflex 2005, DES LAD/diagonal bifurcation 7/12  . Essential hypertension   . GERD (gastroesophageal reflux disease)   . History of kidney stones   . Mixed hyperlipidemia   . Myocardial infarction ([place])    Anterolateral with VF arrest 7/12  .  Type 2 diabetes mellitus ([facility])     Family History  Problem Relation Age of Onset  . Hyperlipidemia Mother   . Hypertension Mother   . Hyperlipidemia Father   . Hypertension Father   . Hypertension Other     Past Surgical History:  Procedure Laterality Date  . CARDIAC CATHETERIZATION  2012  . CAROTID STENT     stents x 2   . CHOLECYSTECTOMY N/A 12/04/2015   Procedure: LAPAROSCOPIC CHOLECYSTECTOMY;  Surgeon: Aviva Signs, MD;  Location: [facility];  Service: General;  Laterality: N/A;  . CHOLECYSTECTOMY, LAPAROSCOPIC  12/05/2015  . CORONARY ANGIOPLASTY  2012   STENT X 1 2012, STENT X 1 YRS BEFORE  . HYDROCELE EXCISION Bilateral 06/02/2017   Procedure: HYDROCELECTOMY ADULT;  Surgeon: Ceasar Mons, MD;  [facility];  Service: Urology;   Laterality: Bilateral;  ONLY NEEDS 45 MIN  . INGUINAL HERNIA REPAIR     RIGHT GROIN  . KNEE ARTHROSCOPY Left 05/23/2019   Procedure: LEFT KNEE ARTHROSCOPY AND DEBRIDEMENT PARTIAL MEDIAL MENISECTOMY;  Surgeon: Newt Minion, MD;  Location: [facility];  Service: Orthopedics;  Laterality: Left;  . TRIGGER FINGER RELEASE Right 01/08/2015   Procedure: RELEASE TRIGGER FINGER/A-1 PULLEY RIGHT MIDDLE FINGER;  Surgeon: Daryll Brod, MD;  Location: [facility];  Service: Orthopedics;  Laterality: Right;   Social History   Occupational History  . Occupation: Respiratory Therapy    Employer: [employer]  Tobacco Use  . Smoking status: Never Smoker  . Smokeless tobacco: Never Used  Substance and Sexual Activity  . Alcohol use: No  . Drug use: No  . Sexual activity: Yes

## 2019-07-13 ENCOUNTER — Other Ambulatory Visit: Payer: Self-pay | Admitting: Nurse Practitioner

## 2019-07-13 MED ORDER — DOXYCYCLINE HYCLATE 100 MG PO TABS: 100.0000 mg | ORAL_TABLET | Freq: Two times a day (BID) | ORAL | 0 refills | Status: DC

## 2019-07-19 ENCOUNTER — Encounter: Payer: Self-pay | Admitting: Nurse Practitioner

## 2019-07-19 ENCOUNTER — Ambulatory Visit: Payer: Medicare PPO | Admitting: Nurse Practitioner

## 2019-07-19 ENCOUNTER — Other Ambulatory Visit: Payer: Self-pay

## 2019-07-19 VITALS — BP 134/68 | HR 70 | Temp 98.6°F | Ht 69.0 in | Wt 237.4 lb

## 2019-07-19 DIAGNOSIS — E1159 Type 2 diabetes mellitus with other circulatory complications: Secondary | ICD-10-CM

## 2019-07-19 DIAGNOSIS — I2510 Atherosclerotic heart disease of native coronary artery without angina pectoris: Secondary | ICD-10-CM | POA: Diagnosis not present

## 2019-07-19 DIAGNOSIS — Z794 Long term (current) use of insulin: Secondary | ICD-10-CM | POA: Diagnosis not present

## 2019-07-19 DIAGNOSIS — R3911 Hesitancy of micturition: Secondary | ICD-10-CM

## 2019-07-19 DIAGNOSIS — Z1212 Encounter for screening for malignant neoplasm of rectum: Secondary | ICD-10-CM

## 2019-07-19 DIAGNOSIS — N401 Enlarged prostate with lower urinary tract symptoms: Secondary | ICD-10-CM | POA: Diagnosis not present

## 2019-07-19 DIAGNOSIS — E782 Mixed hyperlipidemia: Secondary | ICD-10-CM

## 2019-07-19 DIAGNOSIS — K219 Gastro-esophageal reflux disease without esophagitis: Secondary | ICD-10-CM | POA: Insufficient documentation

## 2019-07-19 DIAGNOSIS — I10 Essential (primary) hypertension: Secondary | ICD-10-CM

## 2019-07-19 DIAGNOSIS — R350 Frequency of micturition: Secondary | ICD-10-CM | POA: Diagnosis not present

## 2019-07-19 DIAGNOSIS — Z6836 Body mass index (BMI) 36.0-36.9, adult: Secondary | ICD-10-CM | POA: Insufficient documentation

## 2019-07-19 DIAGNOSIS — M23201 Derangement of unspecified lateral meniscus due to old tear or injury, left knee: Secondary | ICD-10-CM

## 2019-07-19 LAB — BAYER DCA HB A1C WAIVED: HB A1C (BAYER DCA - WAIVED): 6.9 % (ref <7.0)

## 2019-07-19 MED ORDER — TAMSULOSIN HCL 0.4 MG PO CAPS: 0.4000 mg | ORAL_CAPSULE | Freq: Every day | ORAL | 1 refills | Status: DC

## 2019-07-19 MED ORDER — METFORMIN HCL 500 MG PO TABS: 1000.0000 mg | ORAL_TABLET | Freq: Two times a day (BID) | ORAL | 1 refills | Status: DC

## 2019-07-19 MED ORDER — LANTUS SOLOSTAR 100 UNIT/ML ~~LOC~~ SOPN: PEN_INJECTOR | SUBCUTANEOUS | 5 refills | Status: DC

## 2019-07-19 MED ORDER — ATORVASTATIN CALCIUM 10 MG PO TABS: 10.0000 mg | ORAL_TABLET | Freq: Every day | ORAL | 1 refills | Status: DC

## 2019-07-19 MED ORDER — PANTOPRAZOLE SODIUM 40 MG PO TBEC: 40.0000 mg | DELAYED_RELEASE_TABLET | Freq: Every day | ORAL | 1 refills | Status: DC | PRN

## 2019-07-19 MED ORDER — LISINOPRIL 40 MG PO TABS: ORAL_TABLET | ORAL | 1 refills | Status: DC

## 2019-07-19 MED ORDER — CARVEDILOL 12.5 MG PO TABS: 12.5000 mg | ORAL_TABLET | Freq: Two times a day (BID) | ORAL | 1 refills | Status: DC

## 2019-07-19 MED ORDER — VICTOZA 18 MG/3ML ~~LOC~~ SOPN: 1.8000 mg | PEN_INJECTOR | Freq: Every day | SUBCUTANEOUS | 5 refills | Status: DC

## 2019-07-19 NOTE — Progress Notes (Signed)
Subjective:    Patient ID: [name] [name], male    DOB: 07-13-1955, 64 y.o.   MRN: [id number]   Chief Complaint: Medical Management of Chronic Issues    HPI:  1. Essential hypertension, benign Does not check BP at home unless he feels bad. Does not use salt on foods and tries to watch sodium intake. Denies chest pain, sob, headaches. BP Readings from Last 3 Encounters:  05/23/19 (!) 142/74  05/16/19 134/78  04/20/19 (!) 141/80     2. Mixed hyperlipidemia Not really watching diet but cholesterol levels are low. Does some walking but not much exercise. Lab Results  Component Value Date   CHOL 94 (L) 04/20/2019   HDL 22 (L) 04/20/2019   LDLCALC 45 04/20/2019   TRIG 158 (H) 04/20/2019   CHOLHDL 4.3 04/20/2019    3. Type 2 diabetes mellitus with other circulatory complication, with long-term current use of insulin ([place]) Checks blood sugars at home at least twice a day. In the mornings usually run around 70-120. Tries to watch carbohydrates in diet. Denies numbness, tingling in feet. Occasionally has some shakiness when his blood sugars are low but not very often and he is able to resolve symptoms quickly. Lab Results  Component Value Date   HGBA1C 7.0 (H) 04/20/2019    4. Atherosclerosis of native coronary artery of native heart without angina pectoris Followed by Dr. Domenic Polite with cardiology. Last visit was 05/16/19. Had a heart attack several years ago and is doing well since then. Has not needed any nitroglycerin.  5. 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 Sharol Given). Is doing okay though knee still feels a little weak.  6. Frequency of micturition Reported symptoms of UTI on 06/21/19 and was prescribed Bactrim. Symptoms still unresolved so doxycycline was prescribed on 07/13/19. Has 3 more days of doxycycline left and symptoms are now improving.  7. BMI 36.0-36.9,adult No significant changes in  weight. BMI Readings from Last 3 Encounters:  06/20/19 34.70 kg/m  05/23/19 34.77 kg/m  05/16/19 34.85 kg/m   Wt Readings from Last 3 Encounters:  06/20/19 235 lb (106.6 kg)  05/23/19 235 lb 7.2 oz (106.8 kg)  05/16/19 236 lb (107 kg)     8. Benign prostatic hyperplasia with urinary hesitancy Takes flomax for prostate. Also sees urologist.  9. Gastroesophageal reflux disease without esophagitis Doing well. Takes protonix maybe once every couple of weeks.    Outpatient Encounter Medications as of 07/19/2019  Medication Sig  . ACCU-CHEK AVIVA PLUS test strip USE TO CHECK BLOOD SUGAR UP TO 4 TIMES DAILY.  Marland Kitchen aspirin 81 MG tablet Take 81 mg by mouth daily.    Marland Kitchen atorvastatin (LIPITOR) 10 MG tablet Take 1 tablet (10 mg total) by mouth daily.  . blood glucose meter kit and supplies Dispense based on patient and insurance preference. Use up to four times daily as directed. (FOR ICD-10 E10.9, E11.9).  . carvedilol (COREG) 12.5 MG tablet Take 1 tablet (12.5 mg total) by mouth 2 (two) times daily with a meal.  . CINNAMON PO Take 1,000 mg by mouth 2 (two) times daily.   Marland Kitchen CRANBERRY PO Take 4,200 mg by mouth 2 (two) times daily.   Marland Kitchen doxycycline (VIBRA-TABS) 100 MG tablet Take 1 tablet (100 mg total) by mouth 2 (two) times daily. 1 po bid  . HYDROcodone-acetaminophen (NORCO/VICODIN) 5-325 MG tablet Take 1-2 tablets by mouth every 4 (four) hours as  needed for moderate pain.  Marland Kitchen ibuprofen (ADVIL,MOTRIN) 200 MG tablet Take 600 mg by mouth every 6 (six) hours as needed for headache or moderate pain.   Marland Kitchen insulin aspart (NOVOLOG) 100 UNIT/ML injection Inject into the skin 3 (three) times daily before meals. SLIDING SCALE 5-45UNITS  . Insulin Glargine (LANTUS SOLOSTAR) 100 UNIT/ML Solostar Pen 40u qam and 70 u in evening  . Lancets (ONETOUCH ULTRASOFT) lancets Use as instructed twice a day  . liraglutide (VICTOZA) 18 MG/3ML SOPN Inject 0.3 mLs (1.8 mg total) into the skin daily.  Marland Kitchen lisinopril  (ZESTRIL) 40 MG tablet TAKE (1/2) TABLET BY MOUTH 2 TIMES A DAY.  Marland Kitchen loratadine (CLARITIN) 10 MG tablet Take by mouth.  . metFORMIN (GLUCOPHAGE) 500 MG tablet TAKE 2 TABLETS BY MOUTH TWICE DAILY.  . nitroGLYCERIN (NITROSTAT) 0.4 MG SL tablet Place 1 tablet (0.4 mg total) under the tongue every 5 (five) minutes as needed for chest pain.  . pantoprazole (PROTONIX) 40 MG tablet Take 1 tablet (40 mg total) by mouth daily as needed (for acid reflux).  . prasugrel (EFFIENT) 10 MG TABS tablet Take 10 mg by mouth daily  . sulfamethoxazole-trimethoprim (BACTRIM DS) 800-160 MG tablet Take 1 tablet by mouth 2 (two) times daily.  . tamsulosin (FLOMAX) 0.4 MG CAPS capsule Take 1 capsule (0.4 mg total) by mouth at bedtime.  . triamcinolone (NASACORT ALLERGY 24HR) 55 MCG/ACT AERO nasal inhaler Place 2 sprays into the nose daily as needed (for allergies).  Marland Kitchen GLOBAL EASE INJECT PEN NEEDLES 31G X 8 MM MISC    No facility-administered encounter medications on file as of 07/19/2019.    Past Surgical History:  Procedure Laterality Date  . CARDIAC CATHETERIZATION  2012  . CAROTID STENT     stents x 2   . CHOLECYSTECTOMY N/A 12/04/2015   Procedure: LAPAROSCOPIC CHOLECYSTECTOMY;  Surgeon: Aviva Signs, MD;  Location: [facility];  Service: General;  Laterality: N/A;  . CHOLECYSTECTOMY, LAPAROSCOPIC  12/05/2015  . CORONARY ANGIOPLASTY  2012   STENT X 1 2012, STENT X 1 YRS BEFORE  . HYDROCELE EXCISION Bilateral 06/02/2017   Procedure: HYDROCELECTOMY ADULT;  Surgeon: Ceasar Mons, MD;  Location: [facility];  Service: Urology;  Laterality: Bilateral;  ONLY NEEDS 45 MIN  . INGUINAL HERNIA REPAIR     RIGHT GROIN  . KNEE ARTHROSCOPY Left 05/23/2019   Procedure: LEFT KNEE ARTHROSCOPY AND DEBRIDEMENT PARTIAL MEDIAL MENISECTOMY;  Surgeon: Newt Minion, MD;  Location: [place];  Service: Orthopedics;  Laterality: Left;  . TRIGGER FINGER RELEASE Right 01/08/2015   Procedure: RELEASE TRIGGER FINGER/A-1  PULLEY RIGHT MIDDLE FINGER;  Surgeon: Daryll Brod, MD;  Location: [place];  Service: Orthopedics;  Laterality: Right;    Family History  Problem Relation Age of Onset  . Hyperlipidemia Mother   . Hypertension Mother   . Hyperlipidemia Father   . Hypertension Father   . Hypertension Other     New complaints: None  Social history: Lives with wife. Has 3 grandchildren.  Controlled substance contract: N/A     Review of Systems  Constitutional: Negative.   HENT: Negative.   Eyes: Negative.   Respiratory: Negative.   Cardiovascular: Negative.   Gastrointestinal: Negative.   Endocrine: Negative.   Genitourinary: Negative.   Musculoskeletal: Positive for arthralgias (some pain in left knee with sudden twisting motions).  Skin: Negative.   Neurological: Negative.   Psychiatric/Behavioral: Negative.        Objective:   Physical Exam Vitals  and nursing note reviewed.  Constitutional:      Appearance: Normal appearance.  HENT:     Head: Normocephalic.     Right Ear: Tympanic membrane normal.     Left Ear: Tympanic membrane normal.     Nose: Nose normal.     Mouth/Throat:     Mouth: Mucous membranes are moist.  Eyes:     Conjunctiva/sclera: Conjunctivae normal.     Pupils: Pupils are equal, round, and reactive to light.  Cardiovascular:     Rate and Rhythm: Normal rate and regular rhythm.     Pulses: Normal pulses.     Heart sounds: Normal heart sounds.  Pulmonary:     Effort: Pulmonary effort is normal.     Breath sounds: Normal breath sounds.  Abdominal:     General: Bowel sounds are normal.     Palpations: Abdomen is soft.     Hernia: A hernia is present.  Musculoskeletal:        General: Normal range of motion.     Cervical back: Normal range of motion.     Comments: S/p left knee arthroscopy 05/23/19  Skin:    General: Skin is warm and dry.     Capillary Refill: Capillary refill takes less than 2 seconds.  Neurological:     Mental  Status: He is alert and oriented to person, place, and time.  Psychiatric:        Behavior: Behavior normal.   BP 134/68   Pulse 70   Temp 98.6 F (37 C) (Temporal)   Ht '5\' 9"'  (1.753 m)   Wt 237 lb 6.4 oz (107.7 kg)   SpO2 97%   BMI 35.06 kg/m       Assessment & Plan:  BRIDGET WESTBROOKS comes in today with chief complaint of Medical Management of Chronic Issues   Diagnosis and orders addressed:  1. Essential hypertension, benign Low sodium diet - CMP14+EGFR  2. Mixed hyperlipidemia Low fat diet Increase exercise as tolerated - CBC with Differential/Platelet - Lipid panel  3. Type 2 diabetes mellitus with other circulatory complication, with long-term current use of insulin ([facility]) Watch carbs in diet - Bayer DCA Hb A1c Waived  4. Atherosclerosis of native coronary artery of native heart without angina pectoris Follow up with cardiology as scheduled  5. 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 Frequency of micturition Resolving with doxycycline  7. BMI 36.0-36.9,adult Discussed diet and exercise for person with BMI >25 Will recheck weight in 3-6 months  8. Benign prostatic hyperplasia with urinary hesitancy Follow up with urology as scheduled  9. Gastroesophageal reflux disease without esophagitis Avoid spicy foods Do not eat 2 hours prior to bedtime  Meds ordered this encounter  Medications  . metFORMIN (GLUCOPHAGE) 500 MG tablet    Sig: Take 2 tablets (1,000 mg total) by mouth 2 (two) times daily.    Dispense:  120 tablet    Refill:  1    Order Specific Question:   Supervising Provider    Answer:   Caryl Pina A [id number]  . lisinopril (ZESTRIL) 40 MG tablet    Sig: TAKE (1/2) TABLET BY MOUTH 2 TIMES A DAY.    Dispense:  90 tablet    Refill:  1    Order Specific Question:   Supervising Provider    Answer:   Caryl Pina A [id number]  . pantoprazole (PROTONIX) 40 MG tablet    Sig: Take  1 tablet (40 mg total) by mouth daily as needed (for acid reflux).    Dispense:  90 tablet    Refill:  1    Order Specific Question:   Supervising Provider    Answer:   Caryl Pina A [id number]  . carvedilol (COREG) 12.5 MG tablet    Sig: Take 1 tablet (12.5 mg total) by mouth 2 (two) times daily with a meal.    Dispense:  180 tablet    Refill:  1    Order Specific Question:   Supervising Provider    Answer:   Caryl Pina A [[id number]]  . atorvastatin (LIPITOR) 10 MG tablet    Sig: Take 1 tablet (10 mg total) by mouth daily.    Dispense:  90 tablet    Refill:  1    Order Specific Question:   Supervising Provider    Answer:   Caryl Pina A [id number]  . liraglutide (VICTOZA) 18 MG/3ML SOPN    Sig: Inject 0.3 mLs (1.8 mg total) into the skin daily.    Dispense:  3 pen    Refill:  5    Order Specific Question:   Supervising Provider    Answer:   Caryl Pina A [id number]  . insulin glargine (LANTUS SOLOSTAR) 100 UNIT/ML Solostar Pen    Sig: 40u qam and 70 u in evening    Dispense:  30 pen    Refill:  5    Order Specific Question:   Supervising Provider    Answer:   Caryl Pina A [id number]  . tamsulosin (FLOMAX) 0.4 MG CAPS capsule    Sig: Take 1 capsule (0.4 mg total) by mouth at bedtime.    Dispense:  90 capsule    Refill:  1    Order Specific Question:   Supervising Provider    Answer:   Caryl Pina A [id number]    Labs pending Health Maintenance reviewed Diet and exercise encouraged  Follow up plan: 3 months   [name], FNP

## 2019-07-20 ENCOUNTER — Ambulatory Visit (INDEPENDENT_AMBULATORY_CARE_PROVIDER_SITE_OTHER): Payer: Medicare PPO | Admitting: *Deleted

## 2019-07-20 DIAGNOSIS — Z0000 Encounter for general adult medical examination without abnormal findings: Secondary | ICD-10-CM | POA: Diagnosis not present

## 2019-07-20 DIAGNOSIS — I10 Essential (primary) hypertension: Secondary | ICD-10-CM | POA: Insufficient documentation

## 2019-07-20 DIAGNOSIS — D699 Hemorrhagic condition, unspecified: Secondary | ICD-10-CM | POA: Insufficient documentation

## 2019-07-20 LAB — CBC WITH DIFFERENTIAL/PLATELET
Basophils Absolute: 0 10*3/uL (ref 0.0–0.2)
Basos: 1 %
EOS (ABSOLUTE): 0.3 10*3/uL (ref 0.0–0.4)
Eos: 5 %
Hematocrit: 41.1 % (ref 37.5–51.0)
Hemoglobin: 13.5 g/dL (ref 13.0–17.7)
Immature Grans (Abs): 0.1 10*3/uL (ref 0.0–0.1)
Immature Granulocytes: 2 %
Lymphocytes Absolute: 2.2 10*3/uL (ref 0.7–3.1)
Lymphs: 36 %
MCH: 28 pg (ref 26.6–33.0)
MCHC: 32.8 g/dL (ref 31.5–35.7)
MCV: 85 fL (ref 79–97)
Monocytes Absolute: 0.7 10*3/uL (ref 0.1–0.9)
Monocytes: 12 %
Neutrophils Absolute: 2.7 10*3/uL (ref 1.4–7.0)
Neutrophils: 44 %
Platelets: 280 10*3/uL (ref 150–450)
RBC: 4.82 x10E6/uL (ref 4.14–5.80)
RDW: 15.1 % (ref 11.6–15.4)
WBC: 6 10*3/uL (ref 3.4–10.8)

## 2019-07-20 LAB — LIPID PANEL
Chol/HDL Ratio: 4.7 ratio (ref 0.0–5.0)
Cholesterol, Total: 90 mg/dL — ABNORMAL LOW (ref 100–199)
HDL: 19 mg/dL — ABNORMAL LOW (ref >39)
LDL Chol Calc (NIH): 49 mg/dL (ref 0–99)
Triglycerides: 116 mg/dL (ref 0–149)
VLDL Cholesterol Cal: 22 mg/dL (ref 5–40)

## 2019-07-20 LAB — CMP14+EGFR
ALT: 29 IU/L (ref 0–44)
AST: 25 IU/L (ref 0–40)
Albumin/Globulin Ratio: 2.5 — ABNORMAL HIGH (ref 1.2–2.2)
Albumin: 4.8 g/dL (ref 3.8–4.8)
Alkaline Phosphatase: 54 IU/L (ref 39–117)
BUN/Creatinine Ratio: 19 (ref 10–24)
BUN: 20 mg/dL (ref 8–27)
Bilirubin Total: 0.3 mg/dL (ref 0.0–1.2)
CO2: 26 mmol/L (ref 20–29)
Calcium: 9.2 mg/dL (ref 8.6–10.2)
Chloride: 101 mmol/L (ref 96–106)
Creatinine, Ser: 1.06 mg/dL (ref 0.76–1.27)
GFR calc Af Amer: 84 mL/min/{1.73_m2} (ref >59)
GFR calc non Af Amer: 73 mL/min/{1.73_m2} (ref >59)
Globulin, Total: 1.9 g/dL (ref 1.5–4.5)
Glucose: 153 mg/dL — ABNORMAL HIGH (ref 65–99)
Potassium: 4.5 mmol/L (ref 3.5–5.2)
Sodium: 141 mmol/L (ref 134–144)
Total Protein: 6.7 g/dL (ref 6.0–8.5)

## 2019-07-20 NOTE — Patient Instructions (Signed)
[place] Maintenance Summary and Written Plan of Care  [name] ,  Thank you for allowing me to perform your Medicare Annual Wellness Visit and for your ongoing commitment to your health.   Health Maintenance & Immunization History Health Maintenance  Topic Date Due  . COLONOSCOPY  Never done  . Hepatitis C Screening  04/19/2020 (Originally 1955-08-25)  . TETANUS/TDAP  07/18/2020 (Originally 06/20/1974)  . PNA vac Low Risk Adult (1 of 2 - PCV13) 07/18/2020 (Originally 06/21/2018)  . OPHTHALMOLOGY EXAM  10/10/2019  . INFLUENZA VACCINE  10/29/2019  . HEMOGLOBIN A1C  01/18/2020  . FOOT EXAM  07/18/2020  . COVID-19 Vaccine  Completed   Immunization History  Administered Date(s) Administered  . Influenza, High Dose Seasonal PF 01/17/2019  . Influenza,inj,Quad PF,6+ Mos 04/11/2014, 02/07/2018  . PFIZER SARS-COV-2 Vaccination 05/06/2019, 05/27/2019    These are the patient goals that we discussed: Goals Addressed            This Visit's Progress   . Exercise 3x per week (30 min per time)       07/20/2019 AWV Goal: Exercise for General Health   Patient will verbalize understanding of the benefits of increased physical activity:  Exercising regularly is important. It will improve your overall fitness, flexibility, and endurance.  Regular exercise also will improve your overall health. It can help you control your weight, reduce stress, and improve your bone density.  Over the next year, patient will increase physical activity as tolerated with a goal of at least 150 minutes of moderate physical activity per week.   You can tell that you are exercising at a moderate intensity if your heart starts beating faster and you start breathing faster but can still hold a conversation.  Moderate-intensity exercise ideas include:  Walking 1 mile (1.6 km) in about 15 minutes  Biking  Hiking  Golfing  Dancing  Water aerobics  Patient will verbalize  understanding of everyday activities that increase physical activity by providing examples like the following: ? Yard work, such as: ? Pushing a Conservation officer, nature ? Raking and bagging leaves ? Washing your car ? Pushing a stroller ? Shoveling snow ? Gardening ? Washing windows or floors  Patient will be able to explain general safety guidelines for exercising:   Before you start a new exercise program, talk with your health care provider.  Do not exercise so much that you hurt yourself, feel dizzy, or get very short of breath.  Wear comfortable clothes and wear shoes with good support.  Drink plenty of water while you exercise to prevent dehydration or heat stroke.  Work out until your breathing and your heartbeat get faster.     . Have 3 meals a day       07/20/2019 AWV Goal: Improved Nutrition/Diet  . Patient will verbalize understanding that diet plays an important role in overall health and that a poor diet is a risk factor for many chronic medical conditions.  . Over the next year, patient will improve self management of their diet by incorporating decreased fat intake, fewer sweetened foods & beverages, increased physical activity, and better food choices. . Patient will utilize available community resources to help with food acquisition if needed (ex: food pantries, [address], etc) . Patient will work with nutrition specialist if a referral was made         This is a list of Health Maintenance Items that are overdue or due now: Health Maintenance Due  Topic Date Due  . COLONOSCOPY  Never done     Orders/Referrals Placed Today: No orders of the defined types were placed in this encounter.  (Contact our referral department at [phone] if you have not spoken with someone about your referral appointment within the next 5 days)    Follow-up Plan Follow up with [name], FNP as scheduled Keep Eye exam as scheduled Look over Advance Directive Packet and discuss  with family Complete Cologuard once received and send back  Discuss Tdap, Prevnar and Shingrix with [name] FNP at next office visit (Will need to check on the price of Tdap and Shingrix with medicare before receiving)  You can also make an appointment with a nurse to receive these vaccinations Do no hesitate to contact this office at [phone] with any further questions or concerns     Advance Directive  Advance directives are legal documents that let you make choices ahead of time about your health care and medical treatment in case you become unable to communicate for yourself. Advance directives are a way for you to make known your wishes to family, friends, and health care providers. This can let others know about your end-of-life care if you become unable to communicate. Discussing and writing advance directives should happen over time rather than all at once. Advance directives can be changed depending on your situation and what you want, even after you have signed the advance directives. There are different types of advance directives, such as:  Medical power of attorney.  Living will.  Do not resuscitate (DNR) or do not attempt resuscitation (DNAR) order. Health care proxy and medical power of attorney A health care proxy is also called a health care agent. This is a person who is appointed to make medical decisions for you in cases where you are unable to make the decisions yourself. Generally, people choose someone they know well and trust to represent their preferences. Make sure to ask this person for an agreement to act as your proxy. A proxy may have to exercise judgment in the event of a medical decision for which your wishes are not known. A medical power of attorney is a legal document that names your health care proxy. Depending on the laws in your state, after the document is written, it may also need to  be:  Signed.  Notarized.  Dated.  Copied.  Witnessed.  Incorporated into your medical record. You may also want to appoint someone to manage your money in a situation in which you are unable to do so. This is called a durable power of attorney for finances. It is a separate legal document from the durable power of attorney for health care. You may choose the same person or someone different from your health care proxy to act as your agent in money matters. If you do not appoint a proxy, or if there is a concern that the proxy is not acting in your best interests, a court may appoint a guardian to act on your behalf. Living will A living will is a set of instructions that state your wishes about medical care when you cannot express them yourself. Health care providers should keep a copy of your living will in your medical record. You may want to give a copy to family members or friends. To alert caregivers in case of an emergency, you can place a card in your wallet to let them know that you have a living will and where they  can find it. A living will is used if you become:  Terminally ill.  Disabled.  Unable to communicate or make decisions. Items to consider in your living will include:  To use or not to use life-support equipment, such as dialysis machines and breathing machines (ventilators).  A DNR or DNAR order. This tells health care providers not to use cardiopulmonary resuscitation (CPR) if breathing or heartbeat stops.  To use or not to use tube feeding.  To be given or not to be given food and fluids.  Comfort (palliative) care when the goal becomes comfort rather than a cure.  Donation of organs and tissues. A living will does not give instructions for distributing your money and property if you should pass away. DNR or DNAR A DNR or DNAR order is a request not to have CPR in the event that your heart stops beating or you stop breathing. If a DNR or DNAR order has not  been made and shared, a health care provider will try to help any patient whose heart has stopped or who has stopped breathing. If you plan to have surgery, talk with your health care provider about how your DNR or DNAR order will be followed if problems occur. What if I do not have an advance directive? If you do not have an advance directive, some states assign family decision makers to act on your behalf based on how closely you are related to them. Each state has its own laws about advance directives. You may want to check with your health care provider, attorney, or state representative about the laws in your state. Summary  Advance directives are the legal documents that allow you to make choices ahead of time about your health care and medical treatment in case you become unable to tell others about your care.  The process of discussing and writing advance directives should happen over time. You can change the advance directives, even after you have signed them.  Advance directives include DNR or DNAR orders, living wills, and designating an agent as your medical power of attorney. This information is not intended to replace advice given to you by your health care provider. Make sure you discuss any questions you have with your health care provider. Document Revised: 10/13/2018 Document Reviewed: 10/13/2018 Elsevier Patient Education  [place].

## 2019-07-20 NOTE — Progress Notes (Signed)
MEDICARE ANNUAL WELLNESS VISIT  07/20/2019  Telephone Visit Disclaimer This Medicare AWV was conducted by telephone due to national recommendations for restrictions regarding the COVID-19 Pandemic (e.g. social distancing).  I verified, using two identifiers, that I am speaking with [name] or their authorized healthcare agent. I discussed the limitations, risks, security, and privacy concerns of performing an evaluation and management service by telephone and the potential availability of an in-person appointment in the future. The patient expressed understanding and agreed to proceed.   Subjective:  DONTAE [name] is a 64 y.o. male patient of [name] [name], [place] who had a Medicare Annual Wellness Visit today via telephone. [name] [name] is Retired and lives with their spouse. he has 1 child and 1 grandchild . he reports that he is socially active and does interact with friends/family regularly. he is not physically active and enjoys reading.  Patient Care Team: [name] Pretty, FNP as PCP - General (Family Medicine) Satira Sark, MD as PCP - Cardiology (Cardiology)  Advanced Directives 07/20/2019 05/17/2019 05/31/2017 12/04/2015 11/29/2015 01/08/2015 01/07/2015  Does Patient Have a Medical Advance Directive? _0  No No  Would patient like information on creating a medical advance directive? Yes (MAU/Ambulatory/Procedural Areas - Information given) No - Patient declined No - Patient declined No - patient declined information No - patient declined information - [facility] Utilization Over the Past 12 Months: # of hospitalizations or ER visits: 0 # of surgeries: 1  Review of Systems    Patient reports that his overall health is better compared to last year.  History obtained from chart review and the patient General ROS: negative  Patient Reported Readings (BP, Pulse, CBG, Weight, etc) CBG 115  Pain Assessment Pain : No/denies pain     Current  Medications & Allergies (verified) Allergies as of 07/20/2019   No Known Allergies     Medication List       Accurate as of July 20, 2019  9:29 AM. If you have any questions, ask your nurse or doctor.        STOP taking these medications   Nasacort Allergy 24HR 55 MCG/ACT Aero nasal inhaler Generic drug: triamcinolone     TAKE these medications   Accu-Chek Aviva Plus test strip Generic drug: glucose blood USE TO CHECK BLOOD SUGAR UP TO 4 TIMES DAILY.   aspirin 81 MG tablet Take 81 mg by mouth daily.   atorvastatin 10 MG tablet Commonly known as: LIPITOR Take 1 tablet (10 mg total) by mouth daily.   blood glucose meter kit and supplies Dispense based on patient and insurance preference. Use up to four times daily as directed. (FOR ICD-10 E10.9, E11.9).   carvedilol 12.5 MG tablet Commonly known as: COREG Take 1 tablet (12.5 mg total) by mouth 2 (two) times daily with a meal.   cetirizine 10 MG tablet Commonly known as: ZYRTEC Take 10 mg by mouth daily.   CINNAMON PO Take 1,000 mg by mouth 2 (two) times daily.   CRANBERRY PO Take 4,200 mg by mouth 2 (two) times daily.   doxycycline 100 MG tablet Commonly known as: VIBRA-TABS Take 1 tablet (100 mg total) by mouth 2 (two) times daily. 1 po bid   Global Ease Inject Pen Needles 31G X 8 MM Misc Generic drug: Insulin Pen Needle   HYDROcodone-acetaminophen 5-325 MG tablet Commonly known as: NORCO/VICODIN Take 1-2 tablets by mouth every 4 (four) hours as needed for moderate pain.  ibuprofen 200 MG tablet Commonly known as: ADVIL Take 600 mg by mouth every 6 (six) hours as needed for headache or moderate pain.   insulin aspart 100 UNIT/ML injection Commonly known as: novoLOG Inject into the skin 3 (three) times daily before meals. SLIDING SCALE 5-45UNITS   Lantus SoloStar 100 UNIT/ML Solostar Pen Generic drug: insulin glargine 40u qam and 70 u in evening   lisinopril 40 MG tablet Commonly known as:  ZESTRIL TAKE (1/2) TABLET BY MOUTH 2 TIMES A DAY.   metFORMIN 500 MG tablet Commonly known as: GLUCOPHAGE Take 2 tablets (1,000 mg total) by mouth 2 (two) times daily.   nitroGLYCERIN 0.4 MG SL tablet Commonly known as: NITROSTAT Place 1 tablet (0.4 mg total) under the tongue every 5 (five) minutes as needed for chest pain.   onetouch ultrasoft lancets Use as instructed twice a day   pantoprazole 40 MG tablet Commonly known as: PROTONIX Take 1 tablet (40 mg total) by mouth daily as needed (for acid reflux).   prasugrel 10 MG Tabs tablet Commonly known as: EFFIENT Take 10 mg by mouth daily   tamsulosin 0.4 MG Caps capsule Commonly known as: FLOMAX Take 1 capsule (0.4 mg total) by mouth at bedtime.   Victoza 18 MG/3ML Sopn Generic drug: liraglutide Inject 0.3 mLs (1.8 mg total) into the skin daily.       History (reviewed): Past Medical History:  Diagnosis Date  . Anxiety   . Coronary atherosclerosis of native coronary artery    DES distal circumflex 2005, DES LAD/diagonal bifurcation 7/12  . Essential hypertension   . GERD (gastroesophageal reflux disease)   . History of kidney stones   . Mixed hyperlipidemia   . Myocardial infarction ([place])    Anterolateral with VF arrest 7/12  . Type 2 diabetes mellitus ([place])    Past Surgical History:  Procedure Laterality Date  . CARDIAC CATHETERIZATION  2012  . CAROTID STENT     stents x 2   . CHOLECYSTECTOMY N/A 12/04/2015   Procedure: LAPAROSCOPIC CHOLECYSTECTOMY;  Surgeon: Aviva Signs, MD;  Location: [facility];  Service: General;  Laterality: N/A;  . CHOLECYSTECTOMY, LAPAROSCOPIC  12/05/2015  . CORONARY ANGIOPLASTY  2012   STENT X 1 2012, STENT X 1 YRS BEFORE  . HYDROCELE EXCISION Bilateral 06/02/2017   Procedure: HYDROCELECTOMY ADULT;  Surgeon: Ceasar Mons, MD;  Location: [facility];  Service: Urology;  Laterality: Bilateral;  ONLY NEEDS 45 MIN  . INGUINAL HERNIA REPAIR     RIGHT GROIN  . KNEE ARTHROSCOPY Left  05/23/2019   Procedure: LEFT KNEE ARTHROSCOPY AND DEBRIDEMENT PARTIAL MEDIAL MENISECTOMY;  Surgeon: Newt Minion, MD;  Location: [facility];  Service: Orthopedics;  Laterality: Left;  . TRIGGER FINGER RELEASE Right 01/08/2015   Procedure: RELEASE TRIGGER FINGER/A-1 PULLEY RIGHT MIDDLE FINGER;  Surgeon: Daryll Brod, MD;  Location: [facility];  Service: Orthopedics;  Laterality: Right;   Family History  Problem Relation Age of Onset  . Hyperlipidemia Mother   . Hypertension Mother   . Hyperlipidemia Father   . Hypertension Father   . Diabetes Father   . Dementia Father   . Diabetes Maternal Grandfather    Social History   Socioeconomic History  . Marital status: Married    Spouse name: Lelon Frohlich  . Number of children: 1  . Years of education: 76  . Highest education level: Master's degree (e.g., MA, MS, MEng, MEd, MSW, MBA)  Occupational History  . Occupation: Retired  Employer: [employer]  Tobacco Use  . Smoking status: Never Smoker  . Smokeless tobacco: Never Used  Substance and Sexual Activity  . Alcohol use: No  . Drug use: No  . Sexual activity: Yes  Other Topics Concern  . Not on file  Social History Narrative   Retired Statistician   Social Determinants of Health   Financial Resource Strain: Low Risk   . Difficulty of Paying Living Expenses: Not hard at all  Food Insecurity: No Food Insecurity  . Worried About Charity fundraiser in the Last Year: Never true  . Ran Out of Food in the Last Year: Never true  Transportation Needs: No Transportation Needs  . Lack of Transportation (Medical): No  . Lack of Transportation (Non-Medical): No  Physical Activity: Inactive  . Days of Exercise per Week: 0 days  . Minutes of Exercise per Session: 0 min  Stress: No Stress Concern Present  . Feeling of Stress : Not at all  Social Connections: Not Isolated  . Frequency of Communication with Friends and Family: More than three times a week   . Frequency of Social Gatherings with Friends and Family: Once a week  . Attends Religious Services: More than 4 times per year  . Active Member of Clubs or Organizations: Yes  . Attends Archivist Meetings: More than 4 times per year  . Marital Status: Married    Activities of Daily Living In your present state of health, do you have any difficulty performing the following activities: 07/20/2019  Hearing? N  Vision? N  Difficulty concentrating or making decisions? N  Walking or climbing stairs? N  Dressing or bathing? N  Doing errands, shopping? N  Preparing Food and eating ? N  Using the Toilet? N  In the past six months, have you accidently leaked urine? N  Do you have problems with loss of bowel control? N  Managing your Medications? N  Managing your Finances? N  Housekeeping or managing your Housekeeping? N  Some recent data might be hidden    Patient Education/ Literacy How often do you need to have someone help you when you read instructions, pamphlets, or other written materials from your doctor or pharmacy?: 1 - Never What is the last grade level you completed in school?: Masters Degree  Exercise Current Exercise Habits: The patient does not participate in regular exercise at present, Exercise limited by: None identified  Diet Patient reports consuming 3 meals a day and 2 snack(s) a day Patient reports that his primary diet is: Diabetic Patient reports that she does have regular access to food.   Depression Screen PHQ 2/9 Scores 07/20/2019 07/19/2019 04/20/2019 11/11/2010  PHQ - 2 Score 0 0 0 0     Fall Risk Fall Risk  07/20/2019 07/19/2019 04/20/2019  Falls in the past year? 0 1 0  Number falls in past yr: 0 0 -  Injury with Fall? 0 0 -  Risk for fall due to : No Fall Risks Impaired balance/gait -  Follow up Falls evaluation completed Education provided -     Objective:  [name] seemed alert and oriented and he participated appropriately during  our telephone visit.  Blood Pressure Weight BMI  BP Readings from Last 3 Encounters:  07/19/19 134/68  05/23/19 (!) 142/74  05/16/19 134/78   Wt Readings from Last 3 Encounters:  07/19/19 237 lb 6.4 oz (107.7 kg)  06/20/19 235 lb (106.6 kg)  05/23/19 235 lb 7.2  oz (106.8 kg)   BMI Readings from Last 1 Encounters:  07/19/19 35.06 kg/m    *Unable to obtain current vital signs, weight, and BMI due to telephone visit type  Hearing/Vision  . Nicanor did not seem to have difficulty with hearing/understanding during the telephone conversation . Reports that he has had a formal eye exam by an eye care professional within the past year . Reports that he has not had a formal hearing evaluation within the past year *Unable to fully assess hearing and vision during telephone visit type  Cognitive Function: 6CIT Screen 07/20/2019  What Year? 0 points  What month? 0 points  What time? 0 points  Count back from 20 0 points  Months in reverse 0 points  Repeat phrase 0 points  Total Score 0   (Normal:0-7, Significant for Dysfunction: >8)  Normal Cognitive Function Screening: Yes   Immunization & Health Maintenance Record Immunization History  Administered Date(s) Administered  . Influenza, High Dose Seasonal PF 01/17/2019  . Influenza,inj,Quad PF,6+ Mos 04/11/2014, 02/07/2018  . PFIZER SARS-COV-2 Vaccination 05/06/2019, 05/27/2019    Health Maintenance  Topic Date Due  . COLONOSCOPY  Never done  . Hepatitis C Screening  04/19/2020 (Originally [birth year])  . TETANUS/TDAP  07/18/2020 (Originally 06/20/1974)  . PNA vac Low Risk Adult (1 of 2 - PCV13) 07/18/2020 (Originally 06/21/2018)  . OPHTHALMOLOGY EXAM  10/10/2019  . INFLUENZA VACCINE  10/29/2019  . HEMOGLOBIN A1C  01/18/2020  . FOOT EXAM  07/18/2020  . COVID-19 Vaccine  Completed       Assessment  This is a routine wellness examination for DANZELL BIRKY.  Health Maintenance: Due or Overdue Health Maintenance Due  Topic  Date Due  . COLONOSCOPY  Never done    [name] does not need a referral for Community Assistance: Care Management:   no Social Work:    no Prescription Assistance:  no Nutrition/Diabetes Education:  no   Plan:  Personalized Goals Goals Addressed            This Visit's Progress   . Exercise 3x per week (30 min per time)       07/20/2019 AWV Goal: Exercise for General Health   Patient will verbalize understanding of the benefits of increased physical activity:  Exercising regularly is important. It will improve your overall fitness, flexibility, and endurance.  Regular exercise also will improve your overall health. It can help you control your weight, reduce stress, and improve your bone density.  Over the next year, patient will increase physical activity as tolerated with a goal of at least 150 minutes of moderate physical activity per week.   You can tell that you are exercising at a moderate intensity if your heart starts beating faster and you start breathing faster but can still hold a conversation.  Moderate-intensity exercise ideas include:  Walking 1 mile (1.6 km) in about 15 minutes  Biking  Hiking  Golfing  Dancing  Water aerobics  Patient will verbalize understanding of everyday activities that increase physical activity by providing examples like the following: ? Yard work, such as: ? Pushing a Conservation officer, nature ? Raking and bagging leaves ? Washing your car ? Pushing a stroller ? Shoveling snow ? Gardening ? Washing windows or floors  Patient will be able to explain general safety guidelines for exercising:   Before you start a new exercise program, talk with your health care provider.  Do not exercise so much that you hurt yourself, feel dizzy,  or get very short of breath.  Wear comfortable clothes and wear shoes with good support.  Drink plenty of water while you exercise to prevent dehydration or heat stroke.  Work out until your  breathing and your heartbeat get faster.     . Have 3 meals a day       07/20/2019 AWV Goal: Improved Nutrition/Diet  . Patient will verbalize understanding that diet plays an important role in overall health and that a poor diet is a risk factor for many chronic medical conditions.  . Over the next year, patient will improve self management of their diet by incorporating decreased fat intake, fewer sweetened foods & beverages, increased physical activity, and better food choices. . Patient will utilize available community resources to help with food acquisition if needed (ex: food pantries, [address], etc) . Patient will work with nutrition specialist if a referral was made       Personalized Health Maintenance & Screening Recommendations  Pneumococcal vaccine  Td vaccine Advanced directives: has NO advanced directive  - add't info requested. Referral to SW: no Shingrix Eye Exam scheduled for 09/2019 Cologuard ordered on 07/19/2019  Lung Cancer Screening Recommended: no (Low Dose CT Chest recommended if Age 76-80 years, 30 pack-year currently smoking OR have quit w/in past 15 years) Hepatitis C Screening recommended: yes HIV Screening recommended: no  Advanced Directives: Written information was prepared per patient's request.  Referrals & Orders No orders of the defined types were placed in this encounter.   Follow-up Plan . Follow-up with [name] Pretty, FNP as planned . Keep eye exam as scheduled . Complete Cologuard once received and return   I have personally reviewed and noted the following in the patient's chart:   . Medical and social history . Use of alcohol, tobacco or illicit drugs  . Current medications and supplements . Functional ability and status . Nutritional status . Physical activity . Advanced directives . List of other physicians . Hospitalizations, surgeries, and ER visits in previous 12 months . Vitals . Screenings to include cognitive,  depression, and falls . Referrals and appointments  In addition, I have reviewed and discussed with [name] certain preventive protocols, quality metrics, and best practice recommendations. A written personalized care plan for preventive services as well as general preventive health recommendations is available and can be mailed to the patient at his request.      Wardell Heath, LPN  12/21/4142   AVS printed and mailed to patient

## 2019-07-21 ENCOUNTER — Other Ambulatory Visit: Payer: Self-pay | Admitting: Nurse Practitioner

## 2019-08-11 ENCOUNTER — Other Ambulatory Visit: Payer: Self-pay | Admitting: Nurse Practitioner

## 2019-08-11 DIAGNOSIS — Z794 Long term (current) use of insulin: Secondary | ICD-10-CM

## 2019-08-16 DIAGNOSIS — Z1211 Encounter for screening for malignant neoplasm of colon: Secondary | ICD-10-CM | POA: Diagnosis not present

## 2019-08-22 LAB — COLOGUARD: COLOGUARD: NEGATIVE

## 2019-08-25 LAB — COLOGUARD: Cologuard: NEGATIVE

## 2019-09-14 ENCOUNTER — Other Ambulatory Visit: Payer: Self-pay | Admitting: Nurse Practitioner

## 2019-09-19 ENCOUNTER — Telehealth: Payer: Self-pay | Admitting: Nurse Practitioner

## 2019-10-05 ENCOUNTER — Telehealth: Payer: Self-pay | Admitting: *Deleted

## 2019-10-05 NOTE — Telephone Encounter (Signed)
° °  [facility] Pre-operative Risk Assessment    HEARTCARE STAFF: - Please ensure there is not already an duplicate clearance open for this procedure. - Under Visit Info/Reason for Call, type in Other and utilize the format Clearance MM/DD/YY or Clearance TBD. Do not use dashes or single digits. - If request is for dental extraction, please clarify the # of teeth to be extracted.  Request for surgical clearance:  1. What type of surgery is being performed? ONE ROOT CANAL TO BE DONE ON TOOTH # 3     2. When is this surgery scheduled? TBD   3. What type of clearance is required (medical clearance vs. Pharmacy clearance to hold med vs. Both)? MEDICAL  4. Are there any medications that need to be held prior to surgery and how long? ASA AND EFFIENT  5. Practice name and name of physician performing surgery? [facility], DDS, [facility]   6. What is the office phone number? [phone]   7.   What is the office fax number? [phone]  8.   Anesthesia type (None, local, MAC, general) ? NOT LISTED   Julaine Hua 10/05/2019, 2:34 PM  _________________________________________________________________   (provider comments below)

## 2019-10-06 NOTE — Telephone Encounter (Signed)
   Primary Cardiologist: Rozann Lesches, MD  Chart reviewed as part of pre-operative protocol coverage. Patient was contacted 10/06/2019 in reference to pre-operative risk assessment for pending surgery as outlined below.  [name] [name] was last seen on 05/16/2019 by Dr. Domenic Polite.  Since that day, [name] [name] has done well.  Therefore, based on ACC/AHA guidelines, the patient would be at acceptable risk for the planned procedure without further cardiovascular testing.   I will route this recommendation to the requesting party via Epic fax function and remove from pre-op pool. Please call with questions.  We recommend continue aspirin through the procedure. If possible, we would also recommend continue Effient, however if absolutely needed to come off Effient for the root canal procedure, patient may hold it for 7 days.   [place], [place] 10/06/2019, 10:34 AM

## 2019-10-09 ENCOUNTER — Other Ambulatory Visit: Payer: Self-pay | Admitting: Nurse Practitioner

## 2019-10-09 DIAGNOSIS — Z794 Long term (current) use of insulin: Secondary | ICD-10-CM

## 2019-10-10 DIAGNOSIS — E113293 Type 2 diabetes mellitus with mild nonproliferative diabetic retinopathy without macular edema, bilateral: Secondary | ICD-10-CM | POA: Diagnosis not present

## 2019-10-10 DIAGNOSIS — Z794 Long term (current) use of insulin: Secondary | ICD-10-CM | POA: Diagnosis not present

## 2019-10-10 DIAGNOSIS — H25813 Combined forms of age-related cataract, bilateral: Secondary | ICD-10-CM | POA: Diagnosis not present

## 2019-10-10 LAB — HM DIABETES EYE EXAM

## 2019-10-17 ENCOUNTER — Other Ambulatory Visit: Payer: Self-pay | Admitting: Nurse Practitioner

## 2019-10-17 MED ORDER — DOXYCYCLINE HYCLATE 100 MG PO TABS: 100.0000 mg | ORAL_TABLET | Freq: Two times a day (BID) | ORAL | 0 refills | Status: DC

## 2019-10-24 ENCOUNTER — Other Ambulatory Visit: Payer: Self-pay

## 2019-10-24 ENCOUNTER — Ambulatory Visit: Payer: Medicare PPO | Admitting: Nurse Practitioner

## 2019-10-24 ENCOUNTER — Encounter: Payer: Self-pay | Admitting: Nurse Practitioner

## 2019-10-24 VITALS — BP 150/77 | HR 67 | Temp 97.9°F | Resp 20 | Ht 69.0 in | Wt 239.0 lb

## 2019-10-24 DIAGNOSIS — Z6836 Body mass index (BMI) 36.0-36.9, adult: Secondary | ICD-10-CM | POA: Diagnosis not present

## 2019-10-24 DIAGNOSIS — Z794 Long term (current) use of insulin: Secondary | ICD-10-CM

## 2019-10-24 DIAGNOSIS — E1159 Type 2 diabetes mellitus with other circulatory complications: Secondary | ICD-10-CM | POA: Diagnosis not present

## 2019-10-24 DIAGNOSIS — I2510 Atherosclerotic heart disease of native coronary artery without angina pectoris: Secondary | ICD-10-CM

## 2019-10-24 DIAGNOSIS — K219 Gastro-esophageal reflux disease without esophagitis: Secondary | ICD-10-CM | POA: Diagnosis not present

## 2019-10-24 DIAGNOSIS — I10 Essential (primary) hypertension: Secondary | ICD-10-CM | POA: Diagnosis not present

## 2019-10-24 DIAGNOSIS — R3911 Hesitancy of micturition: Secondary | ICD-10-CM

## 2019-10-24 DIAGNOSIS — N401 Enlarged prostate with lower urinary tract symptoms: Secondary | ICD-10-CM | POA: Diagnosis not present

## 2019-10-24 DIAGNOSIS — E782 Mixed hyperlipidemia: Secondary | ICD-10-CM

## 2019-10-24 LAB — BAYER DCA HB A1C WAIVED: HB A1C (BAYER DCA - WAIVED): 8.1 % — ABNORMAL HIGH (ref <7.0)

## 2019-10-24 MED ORDER — LISINOPRIL-HYDROCHLOROTHIAZIDE 20-12.5 MG PO TABS: 1.0000 | ORAL_TABLET | Freq: Every day | ORAL | 1 refills | Status: DC

## 2019-10-24 MED ORDER — FREESTYLE LIBRE 2 SENSOR MISC: 1.0000 | 5 refills | Status: DC

## 2019-10-24 MED ORDER — FREESTYLE LIBRE 2 READER DEVI: 0 refills | Status: DC

## 2019-10-24 MED ORDER — LANTUS SOLOSTAR 100 UNIT/ML ~~LOC~~ SOPN: PEN_INJECTOR | SUBCUTANEOUS | 5 refills | Status: DC

## 2019-10-24 NOTE — Progress Notes (Addendum)
Subjective:    Patient ID: [name] [name], male    DOB: 02-27-1956, 64 y.o.   MRN: [id number]   Chief Complaint: Medical Management of Chronic Issues    HPI:  1. Essential hypertension, benign No c/o chest pain,sob or headache. Does not check blood pressure at home. BP Readings from Last 3 Encounters:  07/19/19 134/68  05/23/19 (!) 142/74  05/16/19 134/78     2. Mixed hyperlipidemia Does try to wtahc diet but does no dedicated exercise. Lab Results  Component Value Date   CHOL 90 (L) 07/19/2019   HDL 19 (L) 07/19/2019   LDLCALC 49 07/19/2019   TRIG 116 07/19/2019   CHOLHDL 4.7 07/19/2019     3. Atherosclerosis of native coronary artery of native heart without angina pectoris Saw cardiology in January and he was doing well. No changes were made to plan of care.  4. Gastroesophageal reflux disease without esophagitis Is on protonix daily and is doing well.   5. Benign prostatic hyperplasia with urinary hesitancy Is on flomax. Has had recent UTI and has needed antibiotics.saw urology in March and he did not make any changes to plan of care.  6. Diabetes Checks his blood sugar 4x a day nd has been a little high. Uses insulin pump. Checks blood sugars 4x a day and as needed.  Lab Results  Component Value Date   HGBA1C 6.9 07/19/2019      7.  BMI 36.0-36.9,adult No recent weight changes Wt Readings from Last 3 Encounters:  10/24/19 (!) 239 lb (108.4 kg)  07/19/19 237 lb 6.4 oz (107.7 kg)  06/20/19 235 lb (106.6 kg)   BMI Readings from Last 3 Encounters:  10/24/19 35.29 kg/m  07/19/19 35.06 kg/m  06/20/19 34.70 kg/m       Outpatient Encounter Medications as of 10/24/2019  Medication Sig  . ACCU-CHEK AVIVA PLUS test strip USE TO CHECK BLOOD SUGAR UP TO 4 TIMES DAILY.  Marland Kitchen Accu-Chek Softclix Lancets lancets USE TO CHECK BLOOD SUGAR UP TO 4 TIMES DAILY.  Marland Kitchen aspirin 81 MG tablet Take 81 mg by mouth daily.    Marland Kitchen atorvastatin (LIPITOR) 10 MG tablet Take 1  tablet (10 mg total) by mouth daily.  . blood glucose meter kit and supplies Dispense based on patient and insurance preference. Use up to four times daily as directed. (FOR ICD-10 E10.9, E11.9).  . carvedilol (COREG) 12.5 MG tablet Take 1 tablet (12.5 mg total) by mouth 2 (two) times daily with a meal.  . cetirizine (ZYRTEC) 10 MG tablet Take 10 mg by mouth daily.  Marland Kitchen CINNAMON PO Take 1,000 mg by mouth 2 (two) times daily.   Marland Kitchen CRANBERRY PO Take 4,200 mg by mouth 2 (two) times daily.   Marland Kitchen doxycycline (VIBRA-TABS) 100 MG tablet Take 1 tablet (100 mg total) by mouth 2 (two) times daily. 1 po bid  . GLOBAL EASE INJECT PEN NEEDLES 31G X 8 MM MISC   . HYDROcodone-acetaminophen (NORCO/VICODIN) 5-325 MG tablet Take 1-2 tablets by mouth every 4 (four) hours as needed for moderate pain. (Patient not taking: Reported on 07/19/2019)  . ibuprofen (ADVIL,MOTRIN) 200 MG tablet Take 600 mg by mouth every 6 (six) hours as needed for headache or moderate pain.   Marland Kitchen insulin aspart (NOVOLOG) 100 UNIT/ML injection Inject into the skin 3 (three) times daily before meals. SLIDING SCALE 5-45UNITS  . insulin glargine (LANTUS SOLOSTAR) 100 UNIT/ML Solostar Pen 40u qam and 70 u in evening  . liraglutide (VICTOZA) 18 MG/3ML  SOPN Inject 0.3 mLs (1.8 mg total) into the skin daily.  Marland Kitchen lisinopril (ZESTRIL) 40 MG tablet TAKE (1/2) TABLET BY MOUTH 2 TIMES A DAY.  . metFORMIN (GLUCOPHAGE) 500 MG tablet TAKE 2 TABLETS BY MOUTH TWICE DAILY.  . nitroGLYCERIN (NITROSTAT) 0.4 MG SL tablet Place 1 tablet (0.4 mg total) under the tongue every 5 (five) minutes as needed for chest pain.  . pantoprazole (PROTONIX) 40 MG tablet Take 1 tablet (40 mg total) by mouth daily as needed (for acid reflux).  . prasugrel (EFFIENT) 10 MG TABS tablet Take 10 mg by mouth daily  . tamsulosin (FLOMAX) 0.4 MG CAPS capsule Take 1 capsule (0.4 mg total) by mouth at bedtime.     Past Surgical History:  Procedure Laterality Date  . CARDIAC CATHETERIZATION   2012  . CAROTID STENT     stents x 2   . CHOLECYSTECTOMY N/A 12/04/2015   Procedure: LAPAROSCOPIC CHOLECYSTECTOMY;  Surgeon: Aviva Signs, MD;  Location: [facility];  Service: General;  Laterality: N/A;  . CHOLECYSTECTOMY, LAPAROSCOPIC  12/05/2015  . CORONARY ANGIOPLASTY  2012   STENT X 1 2012, STENT X 1 YRS BEFORE  . HYDROCELE EXCISION Bilateral 06/02/2017   Procedure: HYDROCELECTOMY ADULT;  Surgeon: Ceasar Mons, MD;  Location: [facility];  Service: Urology;  Laterality: Bilateral;  ONLY NEEDS 45 MIN  . INGUINAL HERNIA REPAIR     RIGHT GROIN  . KNEE ARTHROSCOPY Left 05/23/2019   Procedure: LEFT KNEE ARTHROSCOPY AND DEBRIDEMENT PARTIAL MEDIAL MENISECTOMY;  Surgeon: Newt Minion, MD;  Location: [facility];  Service: Orthopedics;  Laterality: Left;  . TRIGGER FINGER RELEASE Right 01/08/2015   Procedure: RELEASE TRIGGER FINGER/A-1 PULLEY RIGHT MIDDLE FINGER;  Surgeon: Daryll Brod, MD;  Location: [facility];  Service: Orthopedics;  Laterality: Right;    Family History  Problem Relation Age of Onset  . Hyperlipidemia Mother   . Hypertension Mother   . Hyperlipidemia Father   . Hypertension Father   . Diabetes Father   . Dementia Father   . Diabetes Maternal Grandfather     New complaints: None today  Social history: Lives with his wife who just recently retired  Controlled substance contract: n/a    Review of Systems  Constitutional: Negative for diaphoresis.  Eyes: Negative for pain.  Respiratory: Negative for shortness of breath.   Cardiovascular: Negative for chest pain, palpitations and leg swelling.  Gastrointestinal: Negative for abdominal pain.  Endocrine: Negative for polydipsia.  Skin: Negative for rash.  Neurological: Negative for dizziness, weakness and headaches.  Hematological: Does not bruise/bleed easily.  All other systems reviewed and are negative.      Objective:   Physical Exam Vitals and nursing note reviewed.   Constitutional:      Appearance: Normal appearance. He is well-developed.  HENT:     Head: Normocephalic.     Nose: Nose normal.  Eyes:     Pupils: Pupils are equal, round, and reactive to light.  Neck:     Thyroid: No thyroid mass or thyromegaly.     Vascular: No carotid bruit or JVD.     Trachea: Phonation normal.  Cardiovascular:     Rate and Rhythm: Normal rate and regular rhythm.  Pulmonary:     Effort: Pulmonary effort is normal. No respiratory distress.     Breath sounds: Normal breath sounds.  Abdominal:     General: Bowel sounds are normal.     Palpations: Abdomen is soft.  Tenderness: There is no abdominal tenderness.  Musculoskeletal:        General: Normal range of motion.     Cervical back: Normal range of motion and neck supple.  Lymphadenopathy:     Cervical: No cervical adenopathy.  Skin:    General: Skin is warm and dry.  Neurological:     Mental Status: He is alert and oriented to person, place, and time.  Psychiatric:        Behavior: Behavior normal.        Thought Content: Thought content normal.        Judgment: Judgment normal.    BP (!) 150/77   Pulse 67   Temp 97.9 F (36.6 C) (Temporal)   Resp 20   Ht '5\' 9"'  (1.753 m)   Wt (!) 239 lb (108.4 kg)   SpO2 97%   BMI 35.29 kg/m          Assessment & Plan:  HESHAM WOMAC comes in today with chief complaint of Medical Management of Chronic Issues   Diagnosis and orders addressed:  1. Essential hypertension, benign Low sodium diet - CBC with Differential/Platelet - CMP14+EGFR - lisinopril-hydrochlorothiazide (ZESTORETIC) 20-12.5 MG tablet; Take 1 tablet by mouth daily.  Dispense: 180 tablet; Refill: 1  2. Mixed hyperlipidemia Low fat diet - Lipid panel  3. Atherosclerosis of native coronary artery of native heart without angina pectoris Keep follow up with crdiology  4. Gastroesophageal reflux disease without esophagitis Avoid spicy foods Do not eat 2 hours prior to  bedtime  5. Benign prostatic hyperplasia with urinary hesitancy Keep follow up with cardiology  6. BMI 36.0-36.9,adult Discussed diet and exercise for person with BMI >25 Will recheck weight in 3-6 months  7. Type 2 diabetes mellitus with other circulatory complication, with long-term current use of insulin (HCC) Increased lantus to 44 u in am and 75u at night - Bayer DCA Hb A1c Waived - insulin glargine (LANTUS SOLOSTAR) 100 UNIT/ML Solostar Pen; 44u qam and 75 u in evening  Dispense: 30 pen; Refill: 5  Labs pending Health Maintenance reviewed Diet and exercise encouraged  Follow up plan: 3 months   [name], FNP

## 2019-10-24 NOTE — Patient Instructions (Signed)
DASH Eating Plan DASH stands for "Dietary Approaches to Stop Hypertension." The DASH eating plan is a healthy eating plan that has been shown to reduce high blood pressure (hypertension). It may also reduce your risk for type 2 diabetes, heart disease, and stroke. The DASH eating plan may also help with weight loss. What are tips for following this plan?  General guidelines  Avoid eating more than 2,300 mg (milligrams) of salt (sodium) a day. If you have hypertension, you may need to reduce your sodium intake to 1,500 mg a day.  Limit alcohol intake to no more than 1 drink a day for nonpregnant women and 2 drinks a day for men. One drink equals 12 oz of beer, 5 oz of wine, or 1 oz of hard liquor.  Work with your health care provider to maintain a healthy body weight or to lose weight. Ask what an ideal weight is for you.  Get at least 30 minutes of exercise that causes your heart to beat faster (aerobic exercise) most days of the week. Activities may include walking, swimming, or biking.  Work with your health care provider or diet and nutrition specialist (dietitian) to adjust your eating plan to your individual calorie needs. Reading food labels   Check food labels for the amount of sodium per serving. Choose foods with less than 5 percent of the Daily Value of sodium. Generally, foods with less than 300 mg of sodium per serving fit into this eating plan.  To find whole grains, look for the word "whole" as the first word in the ingredient list. Shopping  Buy products labeled as "low-sodium" or "no salt added."  Buy fresh foods. Avoid canned foods and premade or frozen meals. Cooking  Avoid adding salt when cooking. Use salt-free seasonings or herbs instead of table salt or sea salt. Check with your health care provider or pharmacist before using salt substitutes.  Do not fry foods. Cook foods using healthy methods such as baking, boiling, grilling, and broiling instead.  Cook with  heart-healthy oils, such as olive, canola, soybean, or sunflower oil. Meal planning  Eat a balanced diet that includes: ? 5 or more servings of fruits and vegetables each day. At each meal, try to fill half of your plate with fruits and vegetables. ? Up to 6-8 servings of whole grains each day. ? Less than 6 oz of lean meat, poultry, or fish each day. A 3-oz serving of meat is about the same size as a deck of cards. One egg equals 1 oz. ? 2 servings of low-fat dairy each day. ? A serving of nuts, seeds, or beans 5 times each week. ? Heart-healthy fats. Healthy fats called Omega-3 fatty acids are found in foods such as flaxseeds and coldwater fish, like sardines, salmon, and mackerel.  Limit how much you eat of the following: ? Canned or prepackaged foods. ? Food that is high in trans fat, such as fried foods. ? Food that is high in saturated fat, such as fatty meat. ? Sweets, desserts, sugary drinks, and other foods with added sugar. ? Full-fat dairy products.  Do not salt foods before eating.  Try to eat at least 2 vegetarian meals each week.  Eat more home-cooked food and less restaurant, buffet, and fast food.  When eating at a restaurant, ask that your food be prepared with less salt or no salt, if possible. What foods are recommended? The items listed may not be a complete list. Talk with your dietitian about   what dietary choices are best for you. Grains Whole-grain or whole-wheat bread. Whole-grain or whole-wheat pasta. Brown rice. Oatmeal. Quinoa. Bulgur. Whole-grain and low-sodium cereals. Pita bread. Low-fat, low-sodium crackers. Whole-wheat flour tortillas. Vegetables Fresh or frozen vegetables (raw, steamed, roasted, or grilled). Low-sodium or reduced-sodium tomato and vegetable juice. Low-sodium or reduced-sodium tomato sauce and tomato paste. Low-sodium or reduced-sodium canned vegetables. Fruits All fresh, dried, or frozen fruit. Canned fruit in natural juice (without  added sugar). Meat and other protein foods Skinless chicken or turkey. Ground chicken or turkey. Pork with fat trimmed off. Fish and seafood. Egg whites. Dried beans, peas, or lentils. Unsalted nuts, nut butters, and seeds. Unsalted canned beans. Lean cuts of beef with fat trimmed off. Low-sodium, lean deli meat. Dairy Low-fat (1%) or fat-free (skim) milk. Fat-free, low-fat, or reduced-fat cheeses. Nonfat, low-sodium ricotta or cottage cheese. Low-fat or nonfat yogurt. Low-fat, low-sodium cheese. Fats and oils Soft margarine without trans fats. Vegetable oil. Low-fat, reduced-fat, or light mayonnaise and salad dressings (reduced-sodium). Canola, safflower, olive, soybean, and sunflower oils. Avocado. Seasoning and other foods Herbs. Spices. Seasoning mixes without salt. Unsalted popcorn and pretzels. Fat-free sweets. What foods are not recommended? The items listed may not be a complete list. Talk with your dietitian about what dietary choices are best for you. Grains Baked goods made with fat, such as croissants, muffins, or some breads. Dry pasta or rice meal packs. Vegetables Creamed or fried vegetables. Vegetables in a cheese sauce. Regular canned vegetables (not low-sodium or reduced-sodium). Regular canned tomato sauce and paste (not low-sodium or reduced-sodium). Regular tomato and vegetable juice (not low-sodium or reduced-sodium). Pickles. Olives. Fruits Canned fruit in a light or heavy syrup. Fried fruit. Fruit in cream or butter sauce. Meat and other protein foods Fatty cuts of meat. Ribs. Fried meat. Bacon. Sausage. Bologna and other processed lunch meats. Salami. Fatback. Hotdogs. Bratwurst. Salted nuts and seeds. Canned beans with added salt. Canned or smoked fish. Whole eggs or egg yolks. Chicken or turkey with skin. Dairy Whole or 2% milk, cream, and half-and-half. Whole or full-fat cream cheese. Whole-fat or sweetened yogurt. Full-fat cheese. Nondairy creamers. Whipped toppings.  Processed cheese and cheese spreads. Fats and oils Butter. Stick margarine. Lard. Shortening. Ghee. Bacon fat. Tropical oils, such as coconut, palm kernel, or palm oil. Seasoning and other foods Salted popcorn and pretzels. Onion salt, garlic salt, seasoned salt, table salt, and sea salt. Worcestershire sauce. Tartar sauce. Barbecue sauce. Teriyaki sauce. Soy sauce, including reduced-sodium. Steak sauce. Canned and packaged gravies. Fish sauce. Oyster sauce. Cocktail sauce. Horseradish that you find on the shelf. Ketchup. Mustard. Meat flavorings and tenderizers. Bouillon cubes. Hot sauce and Tabasco sauce. Premade or packaged marinades. Premade or packaged taco seasonings. Relishes. Regular salad dressings. Where to find more information:  National Heart, Lung, and Blood Institute: www.nhlbi.nih.gov  American Heart Association: www.heart.org Summary  The DASH eating plan is a healthy eating plan that has been shown to reduce high blood pressure (hypertension). It may also reduce your risk for type 2 diabetes, heart disease, and stroke.  With the DASH eating plan, you should limit salt (sodium) intake to 2,300 mg a day. If you have hypertension, you may need to reduce your sodium intake to 1,500 mg a day.  When on the DASH eating plan, aim to eat more fresh fruits and vegetables, whole grains, lean proteins, low-fat dairy, and heart-healthy fats.  Work with your health care provider or diet and nutrition specialist (dietitian) to adjust your eating plan to your   individual calorie needs. This information is not intended to replace advice given to you by your health care provider. Make sure you discuss any questions you have with your health care provider. Document Revised: 02/26/2017 Document Reviewed: 03/09/2016 Elsevier Patient Education  2020 Elsevier Inc.  

## 2019-10-24 NOTE — Addendum Note (Signed)
Addended by: Chevis Pretty on: 10/24/2019 11:08 AM   Modules accepted: Orders

## 2019-10-25 LAB — CBC WITH DIFFERENTIAL/PLATELET
Basophils Absolute: 0.1 10*3/uL (ref 0.0–0.2)
Basos: 1 %
EOS (ABSOLUTE): 0.2 10*3/uL (ref 0.0–0.4)
Eos: 3 %
Hematocrit: 41.7 % (ref 37.5–51.0)
Hemoglobin: 13 g/dL (ref 13.0–17.7)
Immature Grans (Abs): 0.1 10*3/uL (ref 0.0–0.1)
Immature Granulocytes: 2 %
Lymphocytes Absolute: 2.1 10*3/uL (ref 0.7–3.1)
Lymphs: 28 %
MCH: 26.6 pg (ref 26.6–33.0)
MCHC: 31.2 g/dL — ABNORMAL LOW (ref 31.5–35.7)
MCV: 85 fL (ref 79–97)
Monocytes Absolute: 0.7 10*3/uL (ref 0.1–0.9)
Monocytes: 9 %
Neutrophils Absolute: 4.1 10*3/uL (ref 1.4–7.0)
Neutrophils: 57 %
Platelets: 334 10*3/uL (ref 150–450)
RBC: 4.89 x10E6/uL (ref 4.14–5.80)
RDW: 14.6 % (ref 11.6–15.4)
WBC: 7.3 10*3/uL (ref 3.4–10.8)

## 2019-10-25 LAB — CMP14+EGFR
ALT: 30 IU/L (ref 0–44)
AST: 26 IU/L (ref 0–40)
Albumin/Globulin Ratio: 1.3 (ref 1.2–2.2)
Albumin: 3.8 g/dL (ref 3.8–4.8)
Alkaline Phosphatase: 59 IU/L (ref 48–121)
BUN/Creatinine Ratio: 19 (ref 10–24)
BUN: 17 mg/dL (ref 8–27)
Bilirubin Total: 0.3 mg/dL (ref 0.0–1.2)
CO2: 26 mmol/L (ref 20–29)
Calcium: 9.3 mg/dL (ref 8.6–10.2)
Chloride: 100 mmol/L (ref 96–106)
Creatinine, Ser: 0.9 mg/dL (ref 0.76–1.27)
GFR calc Af Amer: 103 mL/min/{1.73_m2} (ref >59)
GFR calc non Af Amer: 89 mL/min/{1.73_m2} (ref >59)
Globulin, Total: 2.9 g/dL (ref 1.5–4.5)
Glucose: 125 mg/dL — ABNORMAL HIGH (ref 65–99)
Potassium: 4.8 mmol/L (ref 3.5–5.2)
Sodium: 140 mmol/L (ref 134–144)
Total Protein: 6.7 g/dL (ref 6.0–8.5)

## 2019-10-25 LAB — LIPID PANEL
Chol/HDL Ratio: 5.4 ratio — ABNORMAL HIGH (ref 0.0–5.0)
Cholesterol, Total: 97 mg/dL — ABNORMAL LOW (ref 100–199)
HDL: 18 mg/dL — ABNORMAL LOW (ref >39)
LDL Chol Calc (NIH): 53 mg/dL (ref 0–99)
Triglycerides: 145 mg/dL (ref 0–149)
VLDL Cholesterol Cal: 26 mg/dL (ref 5–40)

## 2019-10-26 ENCOUNTER — Telehealth: Payer: Self-pay | Admitting: Nurse Practitioner

## 2019-10-26 NOTE — Telephone Encounter (Signed)
New start [place] appt made with PharmD 7/30 at [facility]

## 2019-10-27 ENCOUNTER — Other Ambulatory Visit: Payer: Self-pay

## 2019-10-27 ENCOUNTER — Ambulatory Visit: Payer: Medicare PPO | Admitting: Pharmacist

## 2019-10-27 DIAGNOSIS — E1122 Type 2 diabetes mellitus with diabetic chronic kidney disease: Secondary | ICD-10-CM

## 2019-10-27 DIAGNOSIS — N183 Chronic kidney disease, stage 3 unspecified: Secondary | ICD-10-CM

## 2019-10-27 MED ORDER — FREESTYLE LIBRE 2 READER DEVI: 0 refills | Status: DC

## 2019-10-27 MED ORDER — FREESTYLE LIBRE 2 SENSOR MISC: 1.0000 | 5 refills | Status: DC

## 2019-10-27 NOTE — Addendum Note (Signed)
Addended by: Lottie Dawson D on: 10/27/2019 11:38 AM   Modules accepted: Orders

## 2019-10-27 NOTE — Progress Notes (Addendum)
    10/27/2019 Name: [name] [name] MRN: [id number] DOB: 08/17/1955   S:  50yoM presents for diabetes evaluation, education, and management.  He has been started on [employer] and needs assistance setting it up.  Patient was referred and last seen by Primary Care Provider on 10/24/19.  Insurance coverage/medication affordability: humana  Patient reports adherence with medications. . Current diabetes medications include: lantus, novolog, victoza . Current hypertension medications include: lisinopril, hctz Goal 130/80 . Current hyperlipidemia medications include: atorvastatin  Patient denies hypoglycemic events.   Patient reported dietary habits: Eats 3 meals/day Discussed meal planning options and Plate method for healthy eating . Avoid sugary drinks and desserts . Incorporate balanced protein, non starchy veggies, 1 serving of carbohydrate with each meal . Increase water intake . Increase physical activity as able  O:  Lab Results  Component Value Date   HGBA1C 8.1 (H) 10/24/2019    Lipid Panel     Component Value Date/Time   CHOL 97 (L) 10/24/2019 1038   TRIG 145 10/24/2019 1038   HDL 18 (L) 10/24/2019 1038   CHOLHDL 5.4 (H) 10/24/2019 1038   CHOLHDL 3.9 04/11/2014 0709   VLDL 29 04/11/2014 0709   LDLCALC 53 10/24/2019 1038    Home fasting blood sugars: N/A  2 hour post-meal/random blood sugars: N/A.   A/P:  Diabetes T2DM currently uncontrolled. Patient is able to verbalize appropriate hypoglycemia management plan. Patient is adherent with medication.    CONTINUE LANTUS    CONTINUE [place]   DISCUSSED SWITCHING FROM VICTOZA TO OZEMPIC FOR EASE OF ONCE WEEKLY GLP1 DOSING; SAME COST   APPLIED FREESTYLE LIBRE 14 DAY SYSTEM TO RIGHT ARM  TEGADERM GIVEN FOR BACK UP ADHESIVE  $75 VOUCHER GIVEN  Will send CGM Libre to [facility] to obtain under part B benefit  -Extensively discussed pathophysiology of diabetes, recommended  lifestyle interventions, dietary effects on blood sugar control  -Counseled on s/sx of and management of hypoglycemia  -Next A1C anticipated 3 MONTHS.  Written patient instructions provided.  Total time in face to face counseling 30 minutes.   Follow up PCP Clinic Visit ON 01/2020.    Regina Eck, PharmD, BCPS Clinical Pharmacist, [place]  II Phone [phone]

## 2019-10-31 ENCOUNTER — Telehealth: Payer: Self-pay | Admitting: Nurse Practitioner

## 2019-11-01 ENCOUNTER — Telehealth: Payer: Self-pay | Admitting: Nurse Practitioner

## 2019-11-01 NOTE — Telephone Encounter (Signed)
Libre reading 40 points higher -- will discuss with rep Patient wants to try Ozempic Sample left in refrigerator-LOT#[id number], exp 01/27/22 -Started GLP-1 Ozempic (generic name: semglutide)  . Instructions: Inject 0.25 mg into the skin weekly for 4 weeks, then increase to 0.5mg  weekly thereafter (as tolerated) HOLD victoza FBG 140 fingerstick, 190 on [place]

## 2019-11-03 NOTE — Telephone Encounter (Signed)
See separate encounter

## 2019-11-09 ENCOUNTER — Other Ambulatory Visit: Payer: Self-pay | Admitting: Nurse Practitioner

## 2019-11-23 ENCOUNTER — Telehealth: Payer: Self-pay | Admitting: Nurse Practitioner

## 2019-11-23 MED ORDER — OZEMPIC (0.25 OR 0.5 MG/DOSE) 2 MG/1.5ML ~~LOC~~ SOPN: 0.5000 mg | PEN_INJECTOR | SUBCUTANEOUS | 0 refills | Status: DC

## 2019-11-23 NOTE — Telephone Encounter (Signed)
Increase to Ozempic 0.5mg  sq weekly Phone # given for [facility] to ask about libre 2

## 2019-11-23 NOTE — Telephone Encounter (Signed)
Pt called requesting to speak with [name] [name] regarding the Ozempic he is currently taking. Pt says he has been taking it for a few wks and wants to know if there needs to be a change in the dosage he is taking because his sugar is running high.

## 2019-12-06 ENCOUNTER — Telehealth: Payer: Self-pay | Admitting: Nurse Practitioner

## 2019-12-06 NOTE — Telephone Encounter (Signed)
Call returned to [place] re: Libre CGM system

## 2019-12-06 NOTE — Telephone Encounter (Signed)
Rep calling for status on libre ppw for [name] [name] [birth year] please call back. Not sure if [place] or [place] [facility]

## 2019-12-11 ENCOUNTER — Other Ambulatory Visit: Payer: Self-pay | Admitting: Nurse Practitioner

## 2019-12-11 DIAGNOSIS — E1159 Type 2 diabetes mellitus with other circulatory complications: Secondary | ICD-10-CM

## 2019-12-12 ENCOUNTER — Other Ambulatory Visit: Payer: Self-pay | Admitting: Nurse Practitioner

## 2019-12-12 DIAGNOSIS — Z794 Long term (current) use of insulin: Secondary | ICD-10-CM

## 2019-12-14 ENCOUNTER — Other Ambulatory Visit: Payer: Self-pay | Admitting: Nurse Practitioner

## 2019-12-14 ENCOUNTER — Telehealth: Payer: Self-pay | Admitting: Nurse Practitioner

## 2019-12-14 DIAGNOSIS — E1159 Type 2 diabetes mellitus with other circulatory complications: Secondary | ICD-10-CM

## 2019-12-14 MED ORDER — METFORMIN HCL 500 MG PO TABS: 1000.0000 mg | ORAL_TABLET | Freq: Two times a day (BID) | ORAL | 1 refills | Status: DC

## 2019-12-14 NOTE — Telephone Encounter (Signed)
  Prescription Request  12/14/2019  What is the name of the medication or equipment? metformin  Have you contacted your pharmacy to request a refill? (if applicable) yes  Which pharmacy would you like this sent to? [facility]    Patient notified that their request is being sent to the clinical staff for review and that they should receive a response within 2 business days.

## 2019-12-14 NOTE — Telephone Encounter (Signed)
NA/NVM, refill has been sent to pharmacy

## 2019-12-14 NOTE — Telephone Encounter (Signed)
Not on medication list- please advise

## 2019-12-21 ENCOUNTER — Other Ambulatory Visit: Payer: Self-pay | Admitting: Nurse Practitioner

## 2019-12-22 DIAGNOSIS — E1159 Type 2 diabetes mellitus with other circulatory complications: Secondary | ICD-10-CM | POA: Diagnosis not present

## 2020-01-10 ENCOUNTER — Other Ambulatory Visit: Payer: Self-pay

## 2020-01-10 DIAGNOSIS — M25519 Pain in unspecified shoulder: Secondary | ICD-10-CM

## 2020-01-22 DIAGNOSIS — M25511 Pain in right shoulder: Secondary | ICD-10-CM | POA: Diagnosis not present

## 2020-01-29 ENCOUNTER — Other Ambulatory Visit: Payer: Self-pay | Admitting: Nurse Practitioner

## 2020-01-31 ENCOUNTER — Telehealth: Payer: Self-pay | Admitting: Cardiology

## 2020-01-31 ENCOUNTER — Ambulatory Visit: Payer: Medicare PPO | Admitting: Nurse Practitioner

## 2020-01-31 ENCOUNTER — Encounter: Payer: Self-pay | Admitting: *Deleted

## 2020-01-31 ENCOUNTER — Other Ambulatory Visit: Payer: Self-pay

## 2020-01-31 ENCOUNTER — Encounter: Payer: Self-pay | Admitting: Nurse Practitioner

## 2020-01-31 VITALS — BP 136/75 | HR 85 | Temp 98.1°F | Resp 20 | Ht 69.0 in | Wt 238.0 lb

## 2020-01-31 DIAGNOSIS — K219 Gastro-esophageal reflux disease without esophagitis: Secondary | ICD-10-CM

## 2020-01-31 DIAGNOSIS — I25119 Atherosclerotic heart disease of native coronary artery with unspecified angina pectoris: Secondary | ICD-10-CM

## 2020-01-31 DIAGNOSIS — R3911 Hesitancy of micturition: Secondary | ICD-10-CM

## 2020-01-31 DIAGNOSIS — E1159 Type 2 diabetes mellitus with other circulatory complications: Secondary | ICD-10-CM | POA: Diagnosis not present

## 2020-01-31 DIAGNOSIS — Z6836 Body mass index (BMI) 36.0-36.9, adult: Secondary | ICD-10-CM | POA: Diagnosis not present

## 2020-01-31 DIAGNOSIS — N401 Enlarged prostate with lower urinary tract symptoms: Secondary | ICD-10-CM

## 2020-01-31 DIAGNOSIS — Z794 Long term (current) use of insulin: Secondary | ICD-10-CM | POA: Diagnosis not present

## 2020-01-31 DIAGNOSIS — I2510 Atherosclerotic heart disease of native coronary artery without angina pectoris: Secondary | ICD-10-CM | POA: Diagnosis not present

## 2020-01-31 DIAGNOSIS — E782 Mixed hyperlipidemia: Secondary | ICD-10-CM | POA: Diagnosis not present

## 2020-01-31 DIAGNOSIS — I10 Essential (primary) hypertension: Secondary | ICD-10-CM

## 2020-01-31 LAB — BAYER DCA HB A1C WAIVED: HB A1C (BAYER DCA - WAIVED): 7.6 % — ABNORMAL HIGH (ref <7.0)

## 2020-01-31 MED ORDER — OZEMPIC (0.25 OR 0.5 MG/DOSE) 2 MG/1.5ML ~~LOC~~ SOPN: PEN_INJECTOR | SUBCUTANEOUS | 0 refills | Status: DC

## 2020-01-31 MED ORDER — ATORVASTATIN CALCIUM 10 MG PO TABS: 10.0000 mg | ORAL_TABLET | Freq: Every day | ORAL | 1 refills | Status: DC

## 2020-01-31 MED ORDER — METFORMIN HCL 500 MG PO TABS: 1000.0000 mg | ORAL_TABLET | Freq: Two times a day (BID) | ORAL | 1 refills | Status: DC

## 2020-01-31 MED ORDER — PANTOPRAZOLE SODIUM 40 MG PO TBEC: 40.0000 mg | DELAYED_RELEASE_TABLET | Freq: Every day | ORAL | 1 refills | Status: DC | PRN

## 2020-01-31 MED ORDER — PRASUGREL HCL 10 MG PO TABS: ORAL_TABLET | ORAL | 1 refills | Status: DC

## 2020-01-31 MED ORDER — TAMSULOSIN HCL 0.4 MG PO CAPS: 0.4000 mg | ORAL_CAPSULE | Freq: Every day | ORAL | 1 refills | Status: DC

## 2020-01-31 MED ORDER — PRASUGREL HCL 10 MG PO TABS: ORAL_TABLET | ORAL | 0 refills | Status: DC

## 2020-01-31 MED ORDER — CARVEDILOL 12.5 MG PO TABS: 12.5000 mg | ORAL_TABLET | Freq: Two times a day (BID) | ORAL | 1 refills | Status: DC

## 2020-01-31 NOTE — Addendum Note (Signed)
Addended by: Merlene Laughter on: 01/31/2020 04:56 PM   Modules accepted: Orders

## 2020-01-31 NOTE — Telephone Encounter (Signed)
Thank you for the update.  I agree that he needs to be seen for further assessment.  If we could go ahead and get a Lexiscan Myoview done prior to this visit that would be good as well for reassessment of ischemic burden compared to his study from 2019.

## 2020-01-31 NOTE — Telephone Encounter (Signed)
Reports intermittent, sharp chest pain with exertion, rated 6-7 with SOB for the past 2 months. Reports the chest pain is happening more frequently. Reports the chest pain resolves after 2 minutes and rest. Has not used nitroglycerin. Denies active chest pain.  Advised that he needed to be evaluated and has appointment already scheduled for 02/05/20 @11 :00 am with Levell July NP. Advised if symptoms get worse, to go to the ED for an evaluation. Verbalized understanding of plan.

## 2020-01-31 NOTE — Telephone Encounter (Signed)
Patient informed and verbalized understanding of plan. 

## 2020-01-31 NOTE — Progress Notes (Addendum)
Subjective:    Patient ID: [name] [name], male    DOB: Feb 06, 1956, 64 y.o.   MRN: [id number]   Chief Complaint: Medical Management of Chronic Issues    HPI:  1. Essential hypertension, benign No c/o chest pain, sob or headache. Does not check lbood pressure at home. BP Readings from Last 3 Encounters:  01/31/20 136/75  10/24/19 (!) 150/77  07/19/19 134/68      2. Atherosclerosis of native coronary artery of native heart without angina pectoris Saw cardiologyin February and no changes were made to plan of care according to note. Still on effient.  3. Mixed hyperlipidemia Does not really watch diet and does very little exercise. Lab Results  Component Value Date   CHOL 97 (L) 10/24/2019   HDL 18 (L) 10/24/2019   LDLCALC 53 10/24/2019   TRIG 145 10/24/2019   CHOLHDL 5.4 (H) 10/24/2019     4. Gastroesophageal reflux disease without esophagitis takes protonix daily, which works well to keep symptoms under control.  5. Benign prostatic hyperplasia with urinary hesitancy Says he is doing well on flomax. Gets occasional UTI. Sees urology yearly.  6.Type 2 diabetes mellitus with other circulatory complication, with long-term current use of insulin ([name] [name]) Patient tries to watch his diet, but does not do much exercise. He checks his blood sugars 4x a day and current;y has an insulin pump. He is unsure of basal setting on pump, but gives insulin based on blood sugar and carb consumption.  Lab Results  Component Value Date   HGBA1C 7.6 (H) 02/07/2020        BMI 36.0-36.9,adult No recent weight changes Wt Readings from Last 3 Encounters:  01/31/20 238 lb (108 kg)  10/24/19 (!) 239 lb (108.4 kg)  07/19/19 237 lb 6.4 oz (107.7 kg)   BMI Readings from Last 3 Encounters:  01/31/20 35.15 kg/m  10/24/19 35.29 kg/m  07/19/19 35.06 kg/m       Outpatient Encounter Medications as of 01/31/2020  Medication Sig  . ACCU-CHEK AVIVA PLUS test strip USE TO CHECK BLOOD SUGAR  UP TO 4 TIMES DAILY.  Marland Kitchen Accu-Chek Softclix Lancets lancets USE TO CHECK BLOOD SUGAR UP TO 4 TIMES DAILY.  Marland Kitchen aspirin 81 MG tablet Take 81 mg by mouth daily.    Marland Kitchen atorvastatin (LIPITOR) 10 MG tablet Take 1 tablet (10 mg total) by mouth daily.  . blood glucose meter kit and supplies Dispense based on patient and insurance preference. Use up to four times daily as directed. (FOR ICD-10 E10.9, E11.9).  . carvedilol (COREG) 12.5 MG tablet Take 1 tablet (12.5 mg total) by mouth 2 (two) times daily with a meal.  . cetirizine (ZYRTEC) 10 MG tablet Take 10 mg by mouth daily.  Marland Kitchen CINNAMON PO Take 1,000 mg by mouth 2 (two) times daily.   . Continuous Blood Gluc Receiver (FREESTYLE LIBRE 14 DAY READER) DEVI USE DAILY TO CHECK BLOOD SUGAR.  Marland Kitchen Continuous Blood Gluc Sensor (FREESTYLE LIBRE 2 SENSOR) MISC 1 each by Does not apply route every 14 (fourteen) days.  Marland Kitchen CRANBERRY PO Take 4,200 mg by mouth 2 (two) times daily.   Marland Kitchen GLOBAL EASE INJECT PEN NEEDLES 31G X 8 MM MISC   . HYDROcodone-acetaminophen (NORCO/VICODIN) 5-325 MG tablet Take 1-2 tablets by mouth every 4 (four) hours as needed for moderate pain. (Patient not taking: Reported on 10/24/2019)  . ibuprofen (ADVIL,MOTRIN) 200 MG tablet Take 600 mg by mouth every 6 (six) hours as needed for headache or moderate pain.   Marland Kitchen  insulin aspart (NOVOLOG) 100 UNIT/ML injection Inject into the skin 3 (three) times daily before meals. SLIDING SCALE 5-45UNITS  . insulin glargine (LANTUS SOLOSTAR) 100 UNIT/ML Solostar Pen 44u qam and 75 u in evening  . liraglutide (VICTOZA) 18 MG/3ML SOPN Inject 0.3 mLs (1.8 mg total) into the skin daily.  Marland Kitchen lisinopril-hydrochlorothiazide (ZESTORETIC) 20-12.5 MG tablet Take 1 tablet by mouth daily.  . metFORMIN (GLUCOPHAGE) 500 MG tablet Take 2 tablets (1,000 mg total) by mouth 2 (two) times daily.  . nitroGLYCERIN (NITROSTAT) 0.4 MG SL tablet Place 1 tablet (0.4 mg total) under the tongue every 5 (five) minutes as needed for chest pain.  Marland Kitchen  OZEMPIC, 0.25 OR 0.5 MG/DOSE, 2 MG/1.5ML SOPN INJECT 0.375 MLS (0.5 MG TOTAL) INTO THE SKIN ONCE A WEEK.  . pantoprazole (PROTONIX) 40 MG tablet Take 1 tablet (40 mg total) by mouth daily as needed (for acid reflux).  . prasugrel (EFFIENT) 10 MG TABS tablet TAKE 1 TABLET ONCE DAILY.  . tamsulosin (FLOMAX) 0.4 MG CAPS capsule Take 1 capsule (0.4 mg total) by mouth at bedtime.   No facility-administered encounter medications on file as of 01/31/2020.    Past Surgical History:  Procedure Laterality Date  . CARDIAC CATHETERIZATION  2012  . CAROTID STENT     stents x 2   . CHOLECYSTECTOMY N/A 12/04/2015   Procedure: LAPAROSCOPIC CHOLECYSTECTOMY;  Surgeon: Aviva Signs, MD;  Location: [facility];  Service: General;  Laterality: N/A;  . CHOLECYSTECTOMY, LAPAROSCOPIC  12/05/2015  . CORONARY ANGIOPLASTY  2012   STENT X 1 2012, STENT X 1 YRS BEFORE  . HYDROCELE EXCISION Bilateral 06/02/2017   Procedure: HYDROCELECTOMY ADULT;  Surgeon: Ceasar Mons, MD;  Location: [facility];  Service: Urology;  Laterality: Bilateral;  ONLY NEEDS 45 MIN  . INGUINAL HERNIA REPAIR     RIGHT GROIN  . KNEE ARTHROSCOPY Left 05/23/2019   Procedure: LEFT KNEE ARTHROSCOPY AND DEBRIDEMENT PARTIAL MEDIAL MENISECTOMY;  Surgeon: Newt Minion, MD;  Location: [facility];  Service: Orthopedics;  Laterality: Left;  . TRIGGER FINGER RELEASE Right 01/08/2015   Procedure: RELEASE TRIGGER FINGER/A-1 PULLEY RIGHT MIDDLE FINGER;  Surgeon: Daryll Brod, MD;  Location: [place];  Service: Orthopedics;  Laterality: Right;    Family History  Problem Relation Age of Onset  . Hyperlipidemia Mother   . Hypertension Mother   . Hyperlipidemia Father   . Hypertension Father   . Diabetes Father   . Dementia Father   . Diabetes Maternal Grandfather     New complaints: none today  Social history: Lives with wife and they are both retired  Controlled substance contract: n/a    Review of Systems   Constitutional: Negative for diaphoresis.  Eyes: Negative for pain.  Respiratory: Negative for shortness of breath.   Cardiovascular: Negative for chest pain, palpitations and leg swelling.  Gastrointestinal: Negative for abdominal pain.  Endocrine: Negative for polydipsia.  Skin: Negative for rash.  Neurological: Negative for dizziness, weakness and headaches.  Hematological: Does not bruise/bleed easily.  All other systems reviewed and are negative.      Objective:   Physical Exam Vitals and nursing note reviewed.  Constitutional:      Appearance: Normal appearance. He is well-developed.  HENT:     Head: Normocephalic.     Nose: Nose normal.  Eyes:     Pupils: Pupils are equal, round, and reactive to light.  Neck:     Thyroid: No thyroid mass or thyromegaly.  Vascular: No carotid bruit or JVD.     Trachea: Phonation normal.  Cardiovascular:     Rate and Rhythm: Normal rate and regular rhythm.  Pulmonary:     Effort: Pulmonary effort is normal. No respiratory distress.     Breath sounds: Normal breath sounds.  Abdominal:     General: Bowel sounds are normal.     Palpations: Abdomen is soft.     Tenderness: There is no abdominal tenderness.  Musculoskeletal:        General: Normal range of motion.     Cervical back: Normal range of motion and neck supple.  Lymphadenopathy:     Cervical: No cervical adenopathy.  Skin:    General: Skin is warm and dry.  Neurological:     Mental Status: He is alert and oriented to person, place, and time.  Psychiatric:        Behavior: Behavior normal.        Thought Content: Thought content normal.        Judgment: Judgment normal.    BP 136/75   Pulse 85   Temp 98.1 F (36.7 C) (Temporal)   Resp 20   Ht '5\' 9"'  (1.753 m)   Wt 238 lb (108 kg)   SpO2 93%   BMI 35.15 kg/m   hgba1c 7.6%      Assessment & Plan:  [name] [name] comes in today with chief complaint of Medical Management of Chronic Issues   Diagnosis and  orders addressed:  1. Essential hypertension, benign Low sodium diet - CBC with Differential/Platelet - CMP14+EGFR - carvedilol (COREG) 12.5 MG tablet; Take 1 tablet (12.5 mg total) by mouth 2 (two) times daily with a meal.  Dispense: 180 tablet; Refill: 1  2. Atherosclerosis of native coronary artery of native heart without angina pectoris Keep yearly follow up with cardiology - prasugrel (EFFIENT) 10 MG TABS tablet; TAKE 1 TABLET ONCE DAILY.  Dispense: 90 tablet; Refill: 0  3. Mixed hyperlipidemia Low fat diet - Lipid panel - atorvastatin (LIPITOR) 10 MG tablet; Take 1 tablet (10 mg total) by mouth daily.  Dispense: 90 tablet; Refill: 1  4. Gastroesophageal reflux disease without esophagitis Avoid spicy foods Do not eat 2 hours prior to bedtime - pantoprazole (PROTONIX) 40 MG tablet; Take 1 tablet (40 mg total) by mouth daily as needed (for acid reflux).  Dispense: 90 tablet; Refill: 1  5. Benign prostatic hyperplasia with urinary hesitancy Keep yearly follow up with urology - tamsulosin (FLOMAX) 0.4 MG CAPS capsule; Take 1 capsule (0.4 mg total) by mouth at bedtime.  Dispense: 90 capsule; Refill: 1  6. BMI 36.0-36.9,adult *Discussed diet and exercise for person with BMI >25 Will recheck weight in 3-6 months**  7. Type 2 diabetes mellitus with other circulatory complication, with long-term current use of insulin (HCC) Continue to watch carbs in diet - Bayer DCA Hb A1c Waived - metFORMIN (GLUCOPHAGE) 500 MG tablet; Take 2 tablets (1,000 mg total) by mouth 2 (two) times daily.  Dispense: 120 tablet; Refill: 1 - Semaglutide,0.25 or 0.5MG/DOS, (OZEMPIC, 0.25 OR 0.5 MG/DOSE,) 2 MG/1.5ML SOPN; INJECT 0.375 MLS (0.5 MG TOTAL) INTO THE SKIN ONCE A WEEK.  Dispense: 1.5 mL; Refill: 0   Labs pending Health Maintenance reviewed Diet and exercise encouraged  Follow up plan: 3 months   [name], FNP

## 2020-01-31 NOTE — Telephone Encounter (Signed)
New message     Pt c/o of Chest Pain: STAT if CP now or developed within 24 hours  1. Are you having CP right now?  Yes he walked to the mailbox   2. Are you experiencing any other symptoms (ex. SOB, nausea, vomiting, sweating)? Sob   3. How long have you been experiencing CP? Has been having them off and on but today they are worse   4. Is your CP continuous or coming and going? Comes and goes   5. Have you taken Nitroglycerin? no ?

## 2020-01-31 NOTE — Patient Instructions (Signed)
Diabetes Mellitus and Foot Care Foot care is an important part of your health, especially when you have diabetes. Diabetes may cause you to have problems because of poor blood flow (circulation) to your feet and legs, which can cause your skin to:  Become thinner and drier.  Break more easily.  Heal more slowly.  Peel and crack. You may also have nerve damage (neuropathy) in your legs and feet, causing decreased feeling in them. This means that you may not notice minor injuries to your feet that could lead to more serious problems. Noticing and addressing any potential problems early is the best way to prevent future foot problems. How to care for your feet Foot hygiene  Wash your feet daily with warm water and mild soap. Do not use hot water. Then, pat your feet and the areas between your toes until they are completely dry. Do not soak your feet as this can dry your skin.  Trim your toenails straight across. Do not dig under them or around the cuticle. File the edges of your nails with an emery board or nail file.  Apply a moisturizing lotion or petroleum jelly to the skin on your feet and to dry, brittle toenails. Use lotion that does not contain alcohol and is unscented. Do not apply lotion between your toes. Shoes and socks  Wear clean socks or stockings every day. Make sure they are not too tight. Do not wear knee-high stockings since they may decrease blood flow to your legs.  Wear shoes that fit properly and have enough cushioning. Always look in your shoes before you put them on to be sure there are no objects inside.  To break in new shoes, wear them for just a few hours a day. This prevents injuries on your feet. Wounds, scrapes, corns, and calluses  Check your feet daily for blisters, cuts, bruises, sores, and redness. If you cannot see the bottom of your feet, use a mirror or ask someone for help.  Do not cut corns or calluses or try to remove them with medicine.  If you  find a minor scrape, cut, or break in the skin on your feet, keep it and the skin around it clean and dry. You may clean these areas with mild soap and water. Do not clean the area with peroxide, alcohol, or iodine.  If you have a wound, scrape, corn, or callus on your foot, look at it several times a day to make sure it is healing and not infected. Check for: ? Redness, swelling, or pain. ? Fluid or blood. ? Warmth. ? Pus or a bad smell. General instructions  Do not cross your legs. This may decrease blood flow to your feet.  Do not use heating pads or hot water bottles on your feet. They may burn your skin. If you have lost feeling in your feet or legs, you may not know this is happening until it is too late.  Protect your feet from hot and cold by wearing shoes, such as at the beach or on hot pavement.  Schedule a complete foot exam at least once a year (annually) or more often if you have foot problems. If you have foot problems, report any cuts, sores, or bruises to your health care provider immediately. Contact a health care provider if:  You have a medical condition that increases your risk of infection and you have any cuts, sores, or bruises on your feet.  You have an injury that is not   healing.  You have redness on your legs or feet.  You feel burning or tingling in your legs or feet.  You have pain or cramps in your legs and feet.  Your legs or feet are numb.  Your feet always feel cold.  You have pain around a toenail. Get help right away if:  You have a wound, scrape, corn, or callus on your foot and: ? You have pain, swelling, or redness that gets worse. ? You have fluid or blood coming from the wound, scrape, corn, or callus. ? Your wound, scrape, corn, or callus feels warm to the touch. ? You have pus or a bad smell coming from the wound, scrape, corn, or callus. ? You have a fever. ? You have a red line going up your leg. Summary  Check your feet every day  for cuts, sores, red spots, swelling, and blisters.  Moisturize feet and legs daily.  Wear shoes that fit properly and have enough cushioning.  If you have foot problems, report any cuts, sores, or bruises to your health care provider immediately.  Schedule a complete foot exam at least once a year (annually) or more often if you have foot problems. This information is not intended to replace advice given to you by your health care provider. Make sure you discuss any questions you have with your health care provider. Document Revised: 12/07/2018 Document Reviewed: 04/17/2016 Elsevier Patient Education  2020 Elsevier Inc.  

## 2020-02-01 LAB — LIPID PANEL
Chol/HDL Ratio: 5.2 ratio — ABNORMAL HIGH (ref 0.0–5.0)
Cholesterol, Total: 109 mg/dL (ref 100–199)
HDL: 21 mg/dL — ABNORMAL LOW (ref >39)
LDL Chol Calc (NIH): 54 mg/dL (ref 0–99)
Triglycerides: 209 mg/dL — ABNORMAL HIGH (ref 0–149)
VLDL Cholesterol Cal: 34 mg/dL (ref 5–40)

## 2020-02-01 LAB — CMP14+EGFR
ALT: 40 IU/L (ref 0–44)
AST: 27 IU/L (ref 0–40)
Albumin/Globulin Ratio: 1.8 (ref 1.2–2.2)
Albumin: 4.6 g/dL (ref 3.8–4.8)
Alkaline Phosphatase: 54 IU/L (ref 44–121)
BUN/Creatinine Ratio: 18 (ref 10–24)
BUN: 23 mg/dL (ref 8–27)
Bilirubin Total: 0.4 mg/dL (ref 0.0–1.2)
CO2: 25 mmol/L (ref 20–29)
Calcium: 10 mg/dL (ref 8.6–10.2)
Chloride: 99 mmol/L (ref 96–106)
Creatinine, Ser: 1.31 mg/dL — ABNORMAL HIGH (ref 0.76–1.27)
GFR calc Af Amer: 65 mL/min/{1.73_m2} (ref >59)
GFR calc non Af Amer: 56 mL/min/{1.73_m2} — ABNORMAL LOW (ref >59)
Globulin, Total: 2.5 g/dL (ref 1.5–4.5)
Glucose: 203 mg/dL — ABNORMAL HIGH (ref 65–99)
Potassium: 5.2 mmol/L (ref 3.5–5.2)
Sodium: 140 mmol/L (ref 134–144)
Total Protein: 7.1 g/dL (ref 6.0–8.5)

## 2020-02-01 LAB — CBC WITH DIFFERENTIAL/PLATELET
Basophils Absolute: 0 10*3/uL (ref 0.0–0.2)
Basos: 1 %
EOS (ABSOLUTE): 0.2 10*3/uL (ref 0.0–0.4)
Eos: 3 %
Hematocrit: 42.2 % (ref 37.5–51.0)
Hemoglobin: 14 g/dL (ref 13.0–17.7)
Immature Grans (Abs): 0 10*3/uL (ref 0.0–0.1)
Immature Granulocytes: 0 %
Lymphocytes Absolute: 2.1 10*3/uL (ref 0.7–3.1)
Lymphs: 37 %
MCH: 28.2 pg (ref 26.6–33.0)
MCHC: 33.2 g/dL (ref 31.5–35.7)
MCV: 85 fL (ref 79–97)
Monocytes Absolute: 0.6 10*3/uL (ref 0.1–0.9)
Monocytes: 11 %
Neutrophils Absolute: 2.7 10*3/uL (ref 1.4–7.0)
Neutrophils: 48 %
Platelets: 259 10*3/uL (ref 150–450)
RBC: 4.96 x10E6/uL (ref 4.14–5.80)
RDW: 13.9 % (ref 11.6–15.4)
WBC: 5.7 10*3/uL (ref 3.4–10.8)

## 2020-02-02 ENCOUNTER — Other Ambulatory Visit: Payer: Self-pay

## 2020-02-02 ENCOUNTER — Ambulatory Visit (HOSPITAL_COMMUNITY)
Admission: RE | Admit: 2020-02-02 | Discharge: 2020-02-02 | Disposition: A | Discharge status: Home / Self Care | Payer: Medicare PPO | Source: Ambulatory Visit | Attending: Cardiology | Admitting: Cardiology

## 2020-02-02 ENCOUNTER — Encounter (HOSPITAL_BASED_OUTPATIENT_CLINIC_OR_DEPARTMENT_OTHER)
Admission: RE | Admit: 2020-02-02 | Discharge: 2020-02-02 | Disposition: A | Discharge status: Home / Self Care | Payer: Medicare PPO | Source: Ambulatory Visit | Attending: Cardiology | Admitting: Cardiology

## 2020-02-02 DIAGNOSIS — I25119 Atherosclerotic heart disease of native coronary artery with unspecified angina pectoris: Secondary | ICD-10-CM | POA: Insufficient documentation

## 2020-02-02 LAB — NM MYOCAR MULTI W/SPECT W/WALL MOTION / EF
LV dias vol: 71 mL (ref 62–150)
LV sys vol: 45 mL
Peak HR: 106 {beats}/min
RATE: 0.38
Rest HR: 78 {beats}/min
SDS: 9
SRS: 4
SSS: 13
TID: 1.18

## 2020-02-02 MED ORDER — TECHNETIUM TC 99M TETROFOSMIN IV KIT
10.0000 | PACK | Freq: Once | INTRAVENOUS | Status: AC | PRN
  Administered 2020-02-02: 10.6 via INTRAVENOUS

## 2020-02-02 MED ORDER — SODIUM CHLORIDE FLUSH 0.9 % IV SOLN
INTRAVENOUS | Status: AC
  Administered 2020-02-02: 10 mL via INTRAVENOUS
  Filled 2020-02-02: qty 10

## 2020-02-02 MED ORDER — REGADENOSON 0.4 MG/5ML IV SOLN
INTRAVENOUS | Status: AC
  Administered 2020-02-02: 0.4 mg via INTRAVENOUS
  Filled 2020-02-02: qty 5

## 2020-02-02 MED ORDER — TECHNETIUM TC 99M TETROFOSMIN IV KIT
30.0000 | PACK | Freq: Once | INTRAVENOUS | Status: AC | PRN
  Administered 2020-02-02: 32.4 via INTRAVENOUS

## 2020-02-04 NOTE — Progress Notes (Signed)
Cardiology Office Note  Date: 02/05/2020   ID: [name], [name] Sep 22, 1955, MRN [id number]  PCP:  Chevis Pretty, FNP  Cardiologist:  Rozann Lesches, MD Electrophysiologist:  None   Chief Complaint: Preop clearance.  History of Present Illness: [name] [name] is a 64 y.o. male with a history of CAD ( DES Lcx 2005, DES LAD/diagonal 2012), HTN, HLD, GERD, DM2.   Last encounter with Dr. Domenic Polite on 05/16/2019.  He reported no obvious anginal symptoms or worsening dyspnea.  He did not have a nitroglycerin refilled he had recently injured his left knee and anticipated arthroscopic surgery the next week.  He was informed he could hold aspirin and Afrin as needed for procedure.  Follow-up echo in February 2020 revealed EF 60 to 65%, mildly dilated ascending aorta.  He was continuing aspirin, Effient, Coreg, lisinopril, Lipitor.  SBP was lying in the 130s at that visit.  Continue to focus on blood pressure control.  Recent hemoglobin A1c was 7%.  States he was following with [facility].  Patient called the office 01/31/2020 complaining of chest pain and shortness of breath after walking to the mailbox.  Stated he been having chest pains off and on but recently worse.  Stated the chest pain was intermittent, sharp with exertion.  Rated the severity 6-7 out of 10 with shortness of breath for the prior 2 months.  Reported chest pain had been occurring more frequently.  Usually resolved after 2 minutes.  He had not used nitroglycerin.  Dr. Domenic Polite ordered a Lexiscan stress test.  Patient is here today to follow-up on abnormal stress test.  We discussed cardiac catheterization.  Risk and benefits were thoroughly discussed with the patient.  He verbalizes understanding.  Continues to complain of increased burning in his chest described as diffuse anterior burning anytime he does more than just normal activities such as walking to the mailbox or walking uphill.  It is  associated with shortness of breath and chest tightness.  Denies any palpitations or arrhythmias, orthostatic symptoms, CVA or TIA-like symptoms.   Past Medical History:  Diagnosis Date  . Anxiety   . Coronary atherosclerosis of native coronary artery    DES distal circumflex 2005, DES LAD/diagonal bifurcation 7/12  . Essential hypertension   . GERD (gastroesophageal reflux disease)   . History of kidney stones   . Mixed hyperlipidemia   . Myocardial infarction ([place])    Anterolateral with VF arrest 7/12  . Type 2 diabetes mellitus ([place])     Past Surgical History:  Procedure Laterality Date  . CARDIAC CATHETERIZATION  2012  . CAROTID STENT     stents x 2   . CHOLECYSTECTOMY N/A 12/04/2015   Procedure: LAPAROSCOPIC CHOLECYSTECTOMY;  Surgeon: Aviva Signs, MD;  Location: [facility];  Service: General;  Laterality: N/A;  . CHOLECYSTECTOMY, LAPAROSCOPIC  12/05/2015  . CORONARY ANGIOPLASTY  2012   STENT X 1 2012, STENT X 1 YRS BEFORE  . HYDROCELE EXCISION Bilateral 06/02/2017   Procedure: HYDROCELECTOMY ADULT;  Surgeon: Ceasar Mons, MD;  Location[facility];  Service: Urology;  Laterality: Bilateral;  ONLY NEEDS 45 MIN  . INGUINAL HERNIA REPAIR     RIGHT GROIN  . KNEE ARTHROSCOPY Left 05/23/2019   Procedure: LEFT KNEE ARTHROSCOPY AND DEBRIDEMENT PARTIAL MEDIAL MENISECTOMY;  Surgeon: Newt Minion, MD;  Location: [facility];  Service: Orthopedics;  Laterality: Left;  . TRIGGER FINGER RELEASE Right 01/08/2015   Procedure: RELEASE TRIGGER FINGER/A-1 PULLEY RIGHT  MIDDLE FINGER;  Surgeon: Daryll Brod, MD;  Location: [facility];  Service: Orthopedics;  Laterality: Right;    Current Outpatient Medications  Medication Sig Dispense Refill  . aspirin 81 MG tablet Take 81 mg by mouth daily.      Marland Kitchen atorvastatin (LIPITOR) 10 MG tablet Take 1 tablet (10 mg total) by mouth daily. 90 tablet 1  . carvedilol (COREG) 12.5 MG tablet Take 1 tablet (12.5 mg total) by  mouth 2 (two) times daily with a meal. 180 tablet 1  . CINNAMON PO Take 1,000 mg by mouth 2 (two) times daily.     Marland Kitchen CRANBERRY PO Take 4,200 mg by mouth 2 (two) times daily.     . insulin aspart (NOVOLOG) 100 UNIT/ML injection Inject into the skin 3 (three) times daily before meals. SLIDING SCALE 5-45UNITS    . insulin glargine (LANTUS SOLOSTAR) 100 UNIT/ML Solostar Pen 44u qam and 75 u in evening 30 pen 5  . lisinopril-hydrochlorothiazide (ZESTORETIC) 20-12.5 MG tablet Take 1 tablet by mouth daily. 180 tablet 1  . metFORMIN (GLUCOPHAGE) 500 MG tablet Take 2 tablets (1,000 mg total) by mouth 2 (two) times daily. 120 tablet 1  . pantoprazole (PROTONIX) 40 MG tablet Take 1 tablet (40 mg total) by mouth daily as needed (for acid reflux). 90 tablet 1  . prasugrel (EFFIENT) 10 MG TABS tablet TAKE 1 TABLET ONCE DAILY. 90 tablet 1  . Semaglutide,0.25 or 0.5MG/DOS, (OZEMPIC, 0.25 OR 0.5 MG/DOSE,) 2 MG/1.5ML SOPN INJECT 0.375 MLS (0.5 MG TOTAL) INTO THE SKIN ONCE A WEEK. 1.5 mL 0  . tamsulosin (FLOMAX) 0.4 MG CAPS capsule Take 1 capsule (0.4 mg total) by mouth at bedtime. 90 capsule 1  . ACCU-CHEK AVIVA PLUS test strip USE TO CHECK BLOOD SUGAR UP TO 4 TIMES DAILY. 100 strip 10  . Accu-Chek Softclix Lancets lancets USE TO CHECK BLOOD SUGAR UP TO 4 TIMES DAILY. 100 each 0  . blood glucose meter kit and supplies Dispense based on patient and insurance preference. Use up to four times daily as directed. (FOR ICD-10 E10.9, E11.9). 1 each 0  . cetirizine (ZYRTEC) 10 MG tablet Take 10 mg by mouth daily.    . Continuous Blood Gluc Receiver (FREESTYLE LIBRE 14 DAY READER) DEVI USE DAILY TO CHECK BLOOD SUGAR. 1 each 2  . Continuous Blood Gluc Sensor (FREESTYLE LIBRE 2 SENSOR) MISC 1 each by Does not apply route every 14 (fourteen) days. 2 each 5  . GLOBAL EASE INJECT PEN NEEDLES 31G X 8 MM MISC     . HYDROcodone-acetaminophen (NORCO/VICODIN) 5-325 MG tablet Take 1-2 tablets by mouth every 4 (four) hours as needed for  moderate pain. 30 tablet 0  . ibuprofen (ADVIL,MOTRIN) 200 MG tablet Take 600 mg by mouth every 6 (six) hours as needed for headache or moderate pain.     . isosorbide mononitrate (IMDUR) 30 MG 24 hr tablet Take 1 tablet (30 mg total) by mouth daily. 90 tablet 3  . nitroGLYCERIN (NITROSTAT) 0.4 MG SL tablet Place 1 tablet (0.4 mg total) under the tongue every 5 (five) minutes as needed for chest pain. 25 tablet 3   No current facility-administered medications for this visit.   Allergies:  Patient has no known allergies.   Social History: The patient  reports that he has never smoked. He has never used smokeless tobacco. He reports that he does not drink alcohol and does not use drugs.   Family History: The patient's family history includes Dementia  in his father; Diabetes in his father and maternal grandfather; Hyperlipidemia in his father and mother; Hypertension in his father and mother.   ROS:  Please see the history of present illness. Otherwise, complete review of systems is positive for none.  All other systems are reviewed and negative.   Physical Exam: VS:  BP 110/60   Pulse 78   Ht _0  (1.753 m)   Wt 237 lb (107.5 kg)   SpO2 95%   BMI 35.00 kg/m , BMI Body mass index is 35 kg/m.  Wt Readings from Last 3 Encounters:  02/05/20 237 lb (107.5 kg)  01/31/20 238 lb (108 kg)  10/24/19 (!) 239 lb (108.4 kg)    General: Patient appears comfortable at rest. Neck: Supple, no elevated JVP or carotid bruits, no thyromegaly. Lungs: Clear to auscultation, nonlabored breathing at rest. Cardiac: Regular rate and rhythm, no S3 or significant systolic murmur, no pericardial rub. Extremities: No pitting edema, distal pulses 2+. Skin: Warm and dry. Musculoskeletal: No kyphosis. Neuropsychiatric: Alert and oriented x3, affect grossly appropriate.  ECG:  An ECG dated 02/05/2020 was personally reviewed today and demonstrated:  Sinus rhythm rate of 77, nonspecific T abnormality.  Recent  Labwork: 01/31/2020: ALT 40; AST 27; BUN 23; Creatinine, Ser 1.31; Hemoglobin 14.0; Platelets 259; Potassium 5.2; Sodium 140     Component Value Date/Time   CHOL 109 01/31/2020 1105   TRIG 209 (H) 01/31/2020 1105   HDL 21 (L) 01/31/2020 1105   CHOLHDL 5.2 (H) 01/31/2020 1105   CHOLHDL 3.9 04/11/2014 0709   VLDL 29 04/11/2014 0709   LDLCALC 54 01/31/2020 1105    Other Studies Reviewed Today:   Lexiscan stress test 02/02/2020   There was no ST segment deviation noted during stress.  Findings consistent with moderate mid to distal anterior/anteroapical ischemial. Large are of inferior/inferolateral ischemia. Both defects are nearly completely reversible.  This is a high risk study. Risk based on decreased LVEF as well as two significant areas of ischemia.  The left ventricular ejection fraction is moderately decreased (30-44%).  Lexiscan Myoview 04/29/2017:  No diagnostic ST segment changes to indicate ischemia. Frequent PVCs.  Minor, mild intensity, apical inferior defect that is partially reversible and consistent with a very small ischemic territory.  This is a low risk study based on perfusion imaging.  Nuclear stress EF: 39%. Visually LVEF looks to be normal - suggest echocardiogram to confirm.  Echocardiogram 05/09/2018: 1. The left ventricle has normal systolic function of 93-81%. The cavity  size was normal. There is moderately increased left ventricular wall  thickness. Echo evidence of impaired diastolic relaxation Elevated mean  left atrial pressure.  2. The right ventricle has normal systolic function. The cavity was  normal. There is no increase in right ventricular wall thickness.  3. The mitral valve is normal in structure. There is mild thickening. No  evidence of mitral valve stenosis.  4. The tricuspid valve is normal in structure.  5. The aortic valve is tricuspid There is mild thickening of the aortic  valve.  6. The aortic root is normal in size  and structure.  7. There is mild dilatation of the ascending aorta.  8. The interatrial septum was not well visualized  Assessment and Plan:  1. Preoperative clearance   2. CAD in native artery   3. Mixed hyperlipidemia   4. Essential hypertension, benign   5. Uncontrolled type 2 diabetes mellitus with hyperglycemia ([place])    1. CAD in native artery / CP /  Abnormal stress test Recent complaints of chest burning, tightness, pain when performing more than usual activities such as walking to the mailbox or walking up inclines.  Stress test was abnormal indicating high risk.  We will schedule for left heart cath.  Thoroughly discussed risk and benefits of cardiac catheterization with patient.  Patient verbalizes understanding.  He states he has had 4 cardiac catheterizations in the past.  Start Imdur 30 mg daily in addition to continuing aspirin 81 mg daily, sublingual nitroglycerin as needed.  Prasugrel 10 mg daily. Left heart cath scheduled February 07, 2020 _0 :30 pm with Dr. Ellyn Hack  2. Mixed hyperlipidemia Continue atorvastatin 10 mg daily.  Recent lipid panel 01/31/2020: TC 109, TG 209, HDL 21, LDL 54.  3. Essential hypertension, benign Blood pressure is well controlled today.  Blood pressure 110/60.  Continue lisinopril/HCTZ 20/12.5 mg p.o. daily.  4. Uncontrolled type 2 diabetes mellitus with hyperglycemia ([place]) History of type 2 diabetes on Lantus and Metformin as well as Ozempic.  Recent glucose on 01/31/2020 was 203, hemoglobin A1c 7.6.  Creatinine 1.31, GFR 56.  Medication Adjustments/Labs and Tests Ordered: Current medicines are reviewed at length with the patient today.  Concerns regarding medicines are outlined above.   Disposition: Follow-up with Dr. Domenic Polite or APP 1 month  Signed, Levell July, NP 02/05/2020 11:42 AM    [facility] [place], [place], [place] [address] Phone: [phone]; Fax: [phone]

## 2020-02-04 NOTE — H&P (View-Only) (Signed)
Cardiology Office Note  Date: 02/05/2020   ID: [name] [name], [name] [name] Sep 22, 1955, MRN [id number]  PCP:  Chevis Pretty, FNP  Cardiologist:  Rozann Lesches, MD Electrophysiologist:  None   Chief Complaint: Preop clearance.  History of Present Illness: [name] [name] is a 64 y.o. male with a history of CAD ( DES Lcx 2005, DES LAD/diagonal 2012), HTN, HLD, GERD, DM2.   Last encounter with Dr. Domenic Polite on 05/16/2019.  He reported no obvious anginal symptoms or worsening dyspnea.  He did not have a nitroglycerin refilled he had recently injured his left knee and anticipated arthroscopic surgery the next week.  He was informed he could hold aspirin and Afrin as needed for procedure.  Follow-up echo in February 2020 revealed EF 60 to 65%, mildly dilated ascending aorta.  He was continuing aspirin, Effient, Coreg, lisinopril, Lipitor.  SBP was lying in the 130s at that visit.  Continue to focus on blood pressure control.  Recent hemoglobin A1c was 7%.  States he was following with [facility].  Patient called the office 01/31/2020 complaining of chest pain and shortness of breath after walking to the mailbox.  Stated he been having chest pains off and on but recently worse.  Stated the chest pain was intermittent, sharp with exertion.  Rated the severity 6-7 out of 10 with shortness of breath for the prior 2 months.  Reported chest pain had been occurring more frequently.  Usually resolved after 2 minutes.  He had not used nitroglycerin.  Dr. Domenic Polite ordered a Lexiscan stress test.  Patient is here today to follow-up on abnormal stress test.  We discussed cardiac catheterization.  Risk and benefits were thoroughly discussed with the patient.  He verbalizes understanding.  Continues to complain of increased burning in his chest described as diffuse anterior burning anytime he does more than just normal activities such as walking to the mailbox or walking uphill.  It is  associated with shortness of breath and chest tightness.  Denies any palpitations or arrhythmias, orthostatic symptoms, CVA or TIA-like symptoms.   Past Medical History:  Diagnosis Date  . Anxiety   . Coronary atherosclerosis of native coronary artery    DES distal circumflex 2005, DES LAD/diagonal bifurcation 7/12  . Essential hypertension   . GERD (gastroesophageal reflux disease)   . History of kidney stones   . Mixed hyperlipidemia   . Myocardial infarction ([place])    Anterolateral with VF arrest 7/12  . Type 2 diabetes mellitus ([place])     Past Surgical History:  Procedure Laterality Date  . CARDIAC CATHETERIZATION  2012  . CAROTID STENT     stents x 2   . CHOLECYSTECTOMY N/A 12/04/2015   Procedure: LAPAROSCOPIC CHOLECYSTECTOMY;  Surgeon: Aviva Signs, MD;  Location: [facility];  Service: General;  Laterality: N/A;  . CHOLECYSTECTOMY, LAPAROSCOPIC  12/05/2015  . CORONARY ANGIOPLASTY  2012   STENT X 1 2012, STENT X 1 YRS BEFORE  . HYDROCELE EXCISION Bilateral 06/02/2017   Procedure: HYDROCELECTOMY ADULT;  Surgeon: Ceasar Mons, MD;  Location: [facility];  Service: Urology;  Laterality: Bilateral;  ONLY NEEDS 45 MIN  . INGUINAL HERNIA REPAIR     RIGHT GROIN  . KNEE ARTHROSCOPY Left 05/23/2019   Procedure: LEFT KNEE ARTHROSCOPY AND DEBRIDEMENT PARTIAL MEDIAL MENISECTOMY;  Surgeon: Newt Minion, MD;  Location: [facility];  Service: Orthopedics;  Laterality: Left;  . TRIGGER FINGER RELEASE Right 01/08/2015   Procedure: RELEASE TRIGGER FINGER/A-1 PULLEY RIGHT  MIDDLE FINGER;  Surgeon: Gary Kuzma, MD;  Location: [facility];  Service: Orthopedics;  Laterality: Right;    Current Outpatient Medications  Medication Sig Dispense Refill  . aspirin 81 MG tablet Take 81 mg by mouth daily.      . atorvastatin (LIPITOR) 10 MG tablet Take 1 tablet (10 mg total) by mouth daily. 90 tablet 1  . carvedilol (COREG) 12.5 MG tablet Take 1 tablet (12.5 mg total) by  mouth 2 (two) times daily with a meal. 180 tablet 1  . CINNAMON PO Take 1,000 mg by mouth 2 (two) times daily.     . CRANBERRY PO Take 4,200 mg by mouth 2 (two) times daily.     . insulin aspart (NOVOLOG) 100 UNIT/ML injection Inject into the skin 3 (three) times daily before meals. SLIDING SCALE 5-45UNITS    . insulin glargine (LANTUS SOLOSTAR) 100 UNIT/ML Solostar Pen 44u qam and 75 u in evening 30 pen 5  . lisinopril-hydrochlorothiazide (ZESTORETIC) 20-12.5 MG tablet Take 1 tablet by mouth daily. 180 tablet 1  . metFORMIN (GLUCOPHAGE) 500 MG tablet Take 2 tablets (1,000 mg total) by mouth 2 (two) times daily. 120 tablet 1  . pantoprazole (PROTONIX) 40 MG tablet Take 1 tablet (40 mg total) by mouth daily as needed (for acid reflux). 90 tablet 1  . prasugrel (EFFIENT) 10 MG TABS tablet TAKE 1 TABLET ONCE DAILY. 90 tablet 1  . Semaglutide,0.25 or 0.5MG/DOS, (OZEMPIC, 0.25 OR 0.5 MG/DOSE,) 2 MG/1.5ML SOPN INJECT 0.375 MLS (0.5 MG TOTAL) INTO THE SKIN ONCE A WEEK. 1.5 mL 0  . tamsulosin (FLOMAX) 0.4 MG CAPS capsule Take 1 capsule (0.4 mg total) by mouth at bedtime. 90 capsule 1  . ACCU-CHEK AVIVA PLUS test strip USE TO CHECK BLOOD SUGAR UP TO 4 TIMES DAILY. 100 strip 10  . Accu-Chek Softclix Lancets lancets USE TO CHECK BLOOD SUGAR UP TO 4 TIMES DAILY. 100 each 0  . blood glucose meter kit and supplies Dispense based on patient and insurance preference. Use up to four times daily as directed. (FOR ICD-10 E10.9, E11.9). 1 each 0  . cetirizine (ZYRTEC) 10 MG tablet Take 10 mg by mouth daily.    . Continuous Blood Gluc Receiver (FREESTYLE LIBRE 14 DAY READER) DEVI USE DAILY TO CHECK BLOOD SUGAR. 1 each 2  . Continuous Blood Gluc Sensor (FREESTYLE LIBRE 2 SENSOR) MISC 1 each by Does not apply route every 14 (fourteen) days. 2 each 5  . GLOBAL EASE INJECT PEN NEEDLES 31G X 8 MM MISC     . HYDROcodone-acetaminophen (NORCO/VICODIN) 5-325 MG tablet Take 1-2 tablets by mouth every 4 (four) hours as needed for  moderate pain. 30 tablet 0  . ibuprofen (ADVIL,MOTRIN) 200 MG tablet Take 600 mg by mouth every 6 (six) hours as needed for headache or moderate pain.     . isosorbide mononitrate (IMDUR) 30 MG 24 hr tablet Take 1 tablet (30 mg total) by mouth daily. 90 tablet 3  . nitroGLYCERIN (NITROSTAT) 0.4 MG SL tablet Place 1 tablet (0.4 mg total) under the tongue every 5 (five) minutes as needed for chest pain. 25 tablet 3   No current facility-administered medications for this visit.   Allergies:  Patient has no known allergies.   Social History: The patient  reports that he has never smoked. He has never used smokeless tobacco. He reports that he does not drink alcohol and does not use drugs.   Family History: The patient's family history includes Dementia   in his father; Diabetes in his father and maternal grandfather; Hyperlipidemia in his father and mother; Hypertension in his father and mother.   ROS:  Please see the history of present illness. Otherwise, complete review of systems is positive for none.  All other systems are reviewed and negative.   Physical Exam: VS:  BP 110/60   Pulse 78   Ht _0  (1.753 m)   Wt 237 lb (107.5 kg)   SpO2 95%   BMI 35.00 kg/m , BMI Body mass index is 35 kg/m.  Wt Readings from Last 3 Encounters:  02/05/20 237 lb (107.5 kg)  01/31/20 238 lb (108 kg)  10/24/19 (!) 239 lb (108.4 kg)    General: Patient appears comfortable at rest. Neck: Supple, no elevated JVP or carotid bruits, no thyromegaly. Lungs: Clear to auscultation, nonlabored breathing at rest. Cardiac: Regular rate and rhythm, no S3 or significant systolic murmur, no pericardial rub. Extremities: No pitting edema, distal pulses 2+. Skin: Warm and dry. Musculoskeletal: No kyphosis. Neuropsychiatric: Alert and oriented x3, affect grossly appropriate.  ECG:  An ECG dated 02/05/2020 was personally reviewed today and demonstrated:  Sinus rhythm rate of 77, nonspecific T abnormality.  Recent  Labwork: 01/31/2020: ALT 40; AST 27; BUN 23; Creatinine, Ser 1.31; Hemoglobin 14.0; Platelets 259; Potassium 5.2; Sodium 140     Component Value Date/Time   CHOL 109 01/31/2020 1105   TRIG 209 (H) 01/31/2020 1105   HDL 21 (L) 01/31/2020 1105   CHOLHDL 5.2 (H) 01/31/2020 1105   CHOLHDL 3.9 04/11/2014 0709   VLDL 29 04/11/2014 0709   LDLCALC 54 01/31/2020 1105    Other Studies Reviewed Today:   Lexiscan stress test 02/02/2020   There was no ST segment deviation noted during stress.  Findings consistent with moderate mid to distal anterior/anteroapical ischemial. Large are of inferior/inferolateral ischemia. Both defects are nearly completely reversible.  This is a high risk study. Risk based on decreased LVEF as well as two significant areas of ischemia.  The left ventricular ejection fraction is moderately decreased (30-44%).  Lexiscan Myoview 04/29/2017:  No diagnostic ST segment changes to indicate ischemia. Frequent PVCs.  Minor, mild intensity, apical inferior defect that is partially reversible and consistent with a very small ischemic territory.  This is a low risk study based on perfusion imaging.  Nuclear stress EF: 39%. Visually LVEF looks to be normal - suggest echocardiogram to confirm.  Echocardiogram 05/09/2018: 1. The left ventricle has normal systolic function of 93-81%. The cavity  size was normal. There is moderately increased left ventricular wall  thickness. Echo evidence of impaired diastolic relaxation Elevated mean  left atrial pressure.  2. The right ventricle has normal systolic function. The cavity was  normal. There is no increase in right ventricular wall thickness.  3. The mitral valve is normal in structure. There is mild thickening. No  evidence of mitral valve stenosis.  4. The tricuspid valve is normal in structure.  5. The aortic valve is tricuspid There is mild thickening of the aortic  valve.  6. The aortic root is normal in size  and structure.  7. There is mild dilatation of the ascending aorta.  8. The interatrial septum was not well visualized  Assessment and Plan:  1. Preoperative clearance   2. CAD in native artery   3. Mixed hyperlipidemia   4. Essential hypertension, benign   5. Uncontrolled type 2 diabetes mellitus with hyperglycemia ([place])    1. CAD in native artery / CP /  Abnormal stress test Recent complaints of chest burning, tightness, pain when performing more than usual activities such as walking to the mailbox or walking up inclines.  Stress test was abnormal indicating high risk.  We will schedule for left heart cath.  Thoroughly discussed risk and benefits of cardiac catheterization with patient.  Patient verbalizes understanding.  He states he has had 4 cardiac catheterizations in the past.  Start Imdur 30 mg daily in addition to continuing aspirin 81 mg daily, sublingual nitroglycerin as needed.  Prasugrel 10 mg daily. Left heart cath scheduled February 07, 2020 _0 :30 pm with Dr. Ellyn Hack  2. Mixed hyperlipidemia Continue atorvastatin 10 mg daily.  Recent lipid panel 01/31/2020: TC 109, TG 209, HDL 21, LDL 54.  3. Essential hypertension, benign Blood pressure is well controlled today.  Blood pressure 110/60.  Continue lisinopril/HCTZ 20/12.5 mg p.o. daily.  4. Uncontrolled type 2 diabetes mellitus with hyperglycemia ([place]) History of type 2 diabetes on Lantus and Metformin as well as Ozempic.  Recent glucose on 01/31/2020 was 203, hemoglobin A1c 7.6.  Creatinine 1.31, GFR 56.  Medication Adjustments/Labs and Tests Ordered: Current medicines are reviewed at length with the patient today.  Concerns regarding medicines are outlined above.   Disposition: Follow-up with Dr. Domenic Polite or APP 1 month  Signed, Levell July, NP 02/05/2020 11:42 AM    [facility] [place], [place], [place] [address] Phone: [phone]; Fax: [phone]

## 2020-02-05 ENCOUNTER — Encounter: Payer: Self-pay | Admitting: Family Medicine

## 2020-02-05 ENCOUNTER — Other Ambulatory Visit (HOSPITAL_COMMUNITY)
Admission: RE | Admit: 2020-02-05 | Discharge: 2020-02-05 | Disposition: A | Discharge status: Home / Self Care | Payer: Medicare PPO | Source: Ambulatory Visit | Attending: Family Medicine | Admitting: Family Medicine

## 2020-02-05 ENCOUNTER — Other Ambulatory Visit (HOSPITAL_COMMUNITY): Payer: Medicare PPO

## 2020-02-05 ENCOUNTER — Other Ambulatory Visit: Payer: Self-pay

## 2020-02-05 ENCOUNTER — Ambulatory Visit: Payer: Medicare PPO | Admitting: Family Medicine

## 2020-02-05 VITALS — BP 110/60 | HR 78 | Ht 69.0 in | Wt 237.0 lb

## 2020-02-05 DIAGNOSIS — I2510 Atherosclerotic heart disease of native coronary artery without angina pectoris: Secondary | ICD-10-CM

## 2020-02-05 DIAGNOSIS — Y929 Unspecified place or not applicable: Secondary | ICD-10-CM | POA: Diagnosis not present

## 2020-02-05 DIAGNOSIS — I10 Essential (primary) hypertension: Secondary | ICD-10-CM

## 2020-02-05 DIAGNOSIS — I2582 Chronic total occlusion of coronary artery: Secondary | ICD-10-CM | POA: Diagnosis present

## 2020-02-05 DIAGNOSIS — Z6835 Body mass index (BMI) 35.0-35.9, adult: Secondary | ICD-10-CM | POA: Diagnosis not present

## 2020-02-05 DIAGNOSIS — K219 Gastro-esophageal reflux disease without esophagitis: Secondary | ICD-10-CM | POA: Diagnosis present

## 2020-02-05 DIAGNOSIS — Z8674 Personal history of sudden cardiac arrest: Secondary | ICD-10-CM | POA: Diagnosis not present

## 2020-02-05 DIAGNOSIS — E782 Mixed hyperlipidemia: Secondary | ICD-10-CM

## 2020-02-05 DIAGNOSIS — F419 Anxiety disorder, unspecified: Secondary | ICD-10-CM | POA: Diagnosis present

## 2020-02-05 DIAGNOSIS — Z20822 Contact with and (suspected) exposure to covid-19: Secondary | ICD-10-CM | POA: Diagnosis present

## 2020-02-05 DIAGNOSIS — Y840 Cardiac catheterization as the cause of abnormal reaction of the patient, or of later complication, without mention of misadventure at the time of the procedure: Secondary | ICD-10-CM | POA: Diagnosis present

## 2020-02-05 DIAGNOSIS — I2584 Coronary atherosclerosis due to calcified coronary lesion: Secondary | ICD-10-CM | POA: Diagnosis present

## 2020-02-05 DIAGNOSIS — N400 Enlarged prostate without lower urinary tract symptoms: Secondary | ICD-10-CM | POA: Diagnosis present

## 2020-02-05 DIAGNOSIS — Z7982 Long term (current) use of aspirin: Secondary | ICD-10-CM | POA: Diagnosis not present

## 2020-02-05 DIAGNOSIS — I252 Old myocardial infarction: Secondary | ICD-10-CM | POA: Diagnosis not present

## 2020-02-05 DIAGNOSIS — Z8249 Family history of ischemic heart disease and other diseases of the circulatory system: Secondary | ICD-10-CM | POA: Diagnosis not present

## 2020-02-05 DIAGNOSIS — I25110 Atherosclerotic heart disease of native coronary artery with unstable angina pectoris: Secondary | ICD-10-CM | POA: Diagnosis present

## 2020-02-05 DIAGNOSIS — T82855A Stenosis of coronary artery stent, initial encounter: Secondary | ICD-10-CM | POA: Diagnosis present

## 2020-02-05 DIAGNOSIS — E6609 Other obesity due to excess calories: Secondary | ICD-10-CM | POA: Diagnosis present

## 2020-02-05 DIAGNOSIS — Z01818 Encounter for other preprocedural examination: Secondary | ICD-10-CM

## 2020-02-05 DIAGNOSIS — E1165 Type 2 diabetes mellitus with hyperglycemia: Secondary | ICD-10-CM

## 2020-02-05 DIAGNOSIS — Z955 Presence of coronary angioplasty implant and graft: Secondary | ICD-10-CM | POA: Diagnosis not present

## 2020-02-05 DIAGNOSIS — Z818 Family history of other mental and behavioral disorders: Secondary | ICD-10-CM | POA: Diagnosis not present

## 2020-02-05 DIAGNOSIS — Z794 Long term (current) use of insulin: Secondary | ICD-10-CM | POA: Diagnosis not present

## 2020-02-05 DIAGNOSIS — Z7902 Long term (current) use of antithrombotics/antiplatelets: Secondary | ICD-10-CM | POA: Diagnosis not present

## 2020-02-05 DIAGNOSIS — Z7984 Long term (current) use of oral hypoglycemic drugs: Secondary | ICD-10-CM | POA: Diagnosis not present

## 2020-02-05 DIAGNOSIS — Z6834 Body mass index (BMI) 34.0-34.9, adult: Secondary | ICD-10-CM | POA: Diagnosis not present

## 2020-02-05 DIAGNOSIS — Z95828 Presence of other vascular implants and grafts: Secondary | ICD-10-CM | POA: Diagnosis not present

## 2020-02-05 DIAGNOSIS — E118 Type 2 diabetes mellitus with unspecified complications: Secondary | ICD-10-CM | POA: Diagnosis not present

## 2020-02-05 DIAGNOSIS — E785 Hyperlipidemia, unspecified: Secondary | ICD-10-CM | POA: Diagnosis not present

## 2020-02-05 DIAGNOSIS — Z79899 Other long term (current) drug therapy: Secondary | ICD-10-CM | POA: Diagnosis not present

## 2020-02-05 DIAGNOSIS — E119 Type 2 diabetes mellitus without complications: Secondary | ICD-10-CM | POA: Diagnosis not present

## 2020-02-05 MED ORDER — ISOSORBIDE MONONITRATE ER 30 MG PO TB24: 30.0000 mg | ORAL_TABLET | Freq: Every day | ORAL | 3 refills | Status: DC

## 2020-02-05 NOTE — Progress Notes (Signed)
Thank You. We will discuss today.

## 2020-02-05 NOTE — Patient Instructions (Signed)
Medication Instructions:    Your physician has recommended you make the following change in your medication:   Start isosorbide mononitrate 30 mg by mouth daily  Continue other medications the same  Labwork:  Covid test today at [facility] at 12:35 pm  Testing/Procedures: Your physician has requested that you have a cardiac catheterization. Cardiac catheterization is used to diagnose and/or treat various heart conditions. Doctors may recommend this procedure for a number of different reasons. The most common reason is to evaluate chest pain. Chest pain can be a symptom of coronary artery disease (CAD), and cardiac catheterization can show whether plaque is narrowing or blocking your heart's arteries. This procedure is also used to evaluate the valves, as well as measure the blood flow and oxygen levels in different parts of your heart. For further information please visit [email or web address]. Please follow instruction sheet, as given.  Follow-Up:  Your physician recommends that you schedule a follow-up appointment in: 1 month  Any Other Special Instructions Will Be Listed Below (If Applicable).  If you need a refill on your cardiac medications before your next appointment, please call your pharmacy.     [place] [address] Dept: [phone] Loc: [phone]  [name] [name]  02/05/2020  You are scheduled for a Cardiac Catheterization on Wednesday, November 10 with Dr. Glenetta Hew.  1. Please arrive at the [facility] (Main Entrance A) at [facility]: [address] [place], [place] [address] at 12:30 PM (This time is two hours before your procedure to ensure your preparation). Free valet parking service is available.   Special note: Every effort is made to have your procedure done on time. Please understand that emergencies sometimes  delay scheduled procedures.  2. Diet: Do not eat solid foods after midnight.  The patient may have clear liquids until 5am upon the day of the procedure.  3. Labs: done on 01/31/2020  4. Medication instructions in preparation for your procedure:   Contrast Allergy: No   Hold morning dose of lisinopril/hctz, ibuprofen, metformin, lantus and novolog You may take 1/2 dose of your lantus (35 units) at bedtime on the night of your cath  Do not take Diabetes Med Glucophage (Metformin) on the day of the procedure and HOLD 48 HOURS AFTER THE PROCEDURE.  On the morning of your procedure, take your Aspirin 81 and any morning medicines NOT listed above.  You may use sips of water.  5. Plan for one night stay--bring personal belongings. 6. Bring a current list of your medications and current insurance cards. 7. You MUST have a responsible person to drive you home. 8. Someone MUST be with you the first 24 hours after you arrive home or your discharge will be delayed. 9. Please wear clothes that are easy to get on and off and wear slip-on shoes.  Thank you for allowing [place] to care for you!   -- [facility]

## 2020-02-06 ENCOUNTER — Telehealth: Payer: Self-pay | Admitting: *Deleted

## 2020-02-06 LAB — SARS CORONAVIRUS 2 (TAT 6-24 HRS): SARS Coronavirus 2: NEGATIVE

## 2020-02-06 NOTE — Telephone Encounter (Signed)
Pt contacted pre-catheterization scheduled at [facility] for: Wednesday February 07, 2020 2:30 PM Verified arrival time and place: [facility]) at: 12:30 PM   No solid food after midnight prior to cath, clear liquids until 5 AM day of procedure.  Hold: Metformin-day of procedure and 48 hours post procedure Insulin-AM of procedure/1/2 usual Insulin HS prior to procedure Lisinopril/HCTZ-day before and day of procedure-GFR 56-pt already taken today Ibuprofen-until post procedure-GFR 56  Except hold medications AM meds can be  taken pre-cath with sips of water including: ASA 81 mg Effient 10 mg  Confirmed patient has responsible adult to drive home post procedure and be with patient first 24 hours after arriving home: yes  You are allowed ONE visitor in the waiting room during the time you are at the hospital for your procedure. Both you and your visitor must wear a mask once you enter the hospital.       COVID-19 Pre-Screening Questions:   In the past 14 days have you had a new cough, new headache, new nasal congestion, fever (100.4 or greater) unexplained body aches, new sore throat, or sudden loss of taste or sense of smell? no  In the past 14 days have you been around anyone with known Covid 19? No   Reviewed procedure/mask/visitor instructions, COVID-19 questions with patient.

## 2020-02-07 ENCOUNTER — Inpatient Hospital Stay (HOSPITAL_COMMUNITY)
Admission: AD | Admit: 2020-02-07 | Discharge: 2020-02-10 | DRG: 247 | Disposition: A | Discharge status: Home / Self Care | Payer: Medicare PPO | Attending: Cardiology | Admitting: Cardiology

## 2020-02-07 ENCOUNTER — Other Ambulatory Visit: Payer: Self-pay

## 2020-02-07 ENCOUNTER — Encounter (HOSPITAL_COMMUNITY)
Admission: AD | Disposition: A | Discharge status: Home / Self Care | Payer: Self-pay | Source: Home / Self Care | Attending: Cardiology

## 2020-02-07 ENCOUNTER — Ambulatory Visit: Payer: Medicare PPO | Admitting: Cardiology

## 2020-02-07 DIAGNOSIS — E119 Type 2 diabetes mellitus without complications: Secondary | ICD-10-CM

## 2020-02-07 DIAGNOSIS — Y840 Cardiac catheterization as the cause of abnormal reaction of the patient, or of later complication, without mention of misadventure at the time of the procedure: Secondary | ICD-10-CM | POA: Diagnosis present

## 2020-02-07 DIAGNOSIS — N400 Enlarged prostate without lower urinary tract symptoms: Secondary | ICD-10-CM | POA: Diagnosis present

## 2020-02-07 DIAGNOSIS — Z7902 Long term (current) use of antithrombotics/antiplatelets: Secondary | ICD-10-CM | POA: Diagnosis not present

## 2020-02-07 DIAGNOSIS — Z8674 Personal history of sudden cardiac arrest: Secondary | ICD-10-CM | POA: Diagnosis not present

## 2020-02-07 DIAGNOSIS — Z955 Presence of coronary angioplasty implant and graft: Secondary | ICD-10-CM | POA: Diagnosis not present

## 2020-02-07 DIAGNOSIS — E1169 Type 2 diabetes mellitus with other specified complication: Secondary | ICD-10-CM

## 2020-02-07 DIAGNOSIS — Z6834 Body mass index (BMI) 34.0-34.9, adult: Secondary | ICD-10-CM

## 2020-02-07 DIAGNOSIS — E66811 Obesity, class 1: Secondary | ICD-10-CM

## 2020-02-07 DIAGNOSIS — I2584 Coronary atherosclerosis due to calcified coronary lesion: Secondary | ICD-10-CM | POA: Diagnosis present

## 2020-02-07 DIAGNOSIS — E785 Hyperlipidemia, unspecified: Secondary | ICD-10-CM

## 2020-02-07 DIAGNOSIS — Y929 Unspecified place or not applicable: Secondary | ICD-10-CM

## 2020-02-07 DIAGNOSIS — T82855A Stenosis of coronary artery stent, initial encounter: Secondary | ICD-10-CM | POA: Diagnosis present

## 2020-02-07 DIAGNOSIS — E118 Type 2 diabetes mellitus with unspecified complications: Secondary | ICD-10-CM

## 2020-02-07 DIAGNOSIS — Z6835 Body mass index (BMI) 35.0-35.9, adult: Secondary | ICD-10-CM | POA: Diagnosis not present

## 2020-02-07 DIAGNOSIS — I2582 Chronic total occlusion of coronary artery: Secondary | ICD-10-CM | POA: Diagnosis present

## 2020-02-07 DIAGNOSIS — I10 Essential (primary) hypertension: Secondary | ICD-10-CM | POA: Diagnosis present

## 2020-02-07 DIAGNOSIS — Z8249 Family history of ischemic heart disease and other diseases of the circulatory system: Secondary | ICD-10-CM

## 2020-02-07 DIAGNOSIS — I252 Old myocardial infarction: Secondary | ICD-10-CM

## 2020-02-07 DIAGNOSIS — K219 Gastro-esophageal reflux disease without esophagitis: Secondary | ICD-10-CM | POA: Diagnosis present

## 2020-02-07 DIAGNOSIS — Z818 Family history of other mental and behavioral disorders: Secondary | ICD-10-CM

## 2020-02-07 DIAGNOSIS — F419 Anxiety disorder, unspecified: Secondary | ICD-10-CM | POA: Diagnosis present

## 2020-02-07 DIAGNOSIS — E782 Mixed hyperlipidemia: Secondary | ICD-10-CM | POA: Diagnosis present

## 2020-02-07 DIAGNOSIS — Z79899 Other long term (current) drug therapy: Secondary | ICD-10-CM | POA: Diagnosis not present

## 2020-02-07 DIAGNOSIS — Z95828 Presence of other vascular implants and grafts: Secondary | ICD-10-CM

## 2020-02-07 DIAGNOSIS — Z7984 Long term (current) use of oral hypoglycemic drugs: Secondary | ICD-10-CM | POA: Diagnosis not present

## 2020-02-07 DIAGNOSIS — I25110 Atherosclerotic heart disease of native coronary artery with unstable angina pectoris: Secondary | ICD-10-CM | POA: Diagnosis present

## 2020-02-07 DIAGNOSIS — Z833 Family history of diabetes mellitus: Secondary | ICD-10-CM

## 2020-02-07 DIAGNOSIS — Z20822 Contact with and (suspected) exposure to covid-19: Secondary | ICD-10-CM | POA: Diagnosis present

## 2020-02-07 DIAGNOSIS — Z794 Long term (current) use of insulin: Secondary | ICD-10-CM | POA: Diagnosis not present

## 2020-02-07 DIAGNOSIS — E6609 Other obesity due to excess calories: Secondary | ICD-10-CM | POA: Diagnosis present

## 2020-02-07 DIAGNOSIS — E1165 Type 2 diabetes mellitus with hyperglycemia: Secondary | ICD-10-CM | POA: Diagnosis present

## 2020-02-07 DIAGNOSIS — I2510 Atherosclerotic heart disease of native coronary artery without angina pectoris: Secondary | ICD-10-CM | POA: Diagnosis not present

## 2020-02-07 DIAGNOSIS — Z7982 Long term (current) use of aspirin: Secondary | ICD-10-CM | POA: Diagnosis not present

## 2020-02-07 HISTORY — PX: LEFT HEART CATH AND CORONARY ANGIOGRAPHY: CATH118249

## 2020-02-07 LAB — GLUCOSE, CAPILLARY
Glucose-Capillary: 144 mg/dL — ABNORMAL HIGH (ref 70–99)
Glucose-Capillary: 137 mg/dL — ABNORMAL HIGH (ref 70–99)
Glucose-Capillary: 399 mg/dL — ABNORMAL HIGH (ref 70–99)

## 2020-02-07 LAB — CBC
HCT: 40.4 % (ref 39.0–52.0)
Hemoglobin: 12.8 g/dL — ABNORMAL LOW (ref 13.0–17.0)
MCH: 28.1 pg (ref 26.0–34.0)
MCHC: 31.7 g/dL (ref 30.0–36.0)
MCV: 88.8 fL (ref 80.0–100.0)
Platelets: 228 10*3/uL (ref 150–400)
RBC: 4.55 MIL/uL (ref 4.22–5.81)
RDW: 13.8 % (ref 11.5–15.5)
WBC: 7.1 10*3/uL (ref 4.0–10.5)
nRBC: 0 % (ref 0.0–0.2)

## 2020-02-07 LAB — HEMOGLOBIN A1C
Hgb A1c MFr Bld: 7.6 % — ABNORMAL HIGH (ref 4.8–5.6)
Mean Plasma Glucose: 171.42 mg/dL

## 2020-02-07 SURGERY — LEFT HEART CATH AND CORONARY ANGIOGRAPHY
Anesthesia: LOCAL

## 2020-02-07 MED ORDER — VERAPAMIL HCL 2.5 MG/ML IV SOLN
INTRAVENOUS | Status: AC
  Filled 2020-02-07: qty 2

## 2020-02-07 MED ORDER — FENTANYL CITRATE (PF) 100 MCG/2ML IJ SOLN
INTRAMUSCULAR | Status: DC | PRN
  Administered 2020-02-07: 50 ug via INTRAVENOUS

## 2020-02-07 MED ORDER — LIDOCAINE HCL (PF) 1 % IJ SOLN
INTRAMUSCULAR | Status: AC
  Filled 2020-02-07: qty 30

## 2020-02-07 MED ORDER — LISINOPRIL-HYDROCHLOROTHIAZIDE 20-12.5 MG PO TABS: 1.0000 | ORAL_TABLET | Freq: Every day | ORAL | Status: DC

## 2020-02-07 MED ORDER — MELATONIN 5 MG PO TABS
10.0000 mg | ORAL_TABLET | Freq: Every day | ORAL | Status: DC
  Administered 2020-02-07 – 2020-02-09 (×3): 10 mg via ORAL
  Filled 2020-02-07 (×3): qty 2

## 2020-02-07 MED ORDER — SODIUM CHLORIDE 0.9% FLUSH: 3.0000 mL | Freq: Two times a day (BID) | INTRAVENOUS | Status: DC

## 2020-02-07 MED ORDER — HEPARIN SODIUM (PORCINE) 1000 UNIT/ML IJ SOLN
INTRAMUSCULAR | Status: DC | PRN
  Administered 2020-02-07: 5000 [IU] via INTRAVENOUS

## 2020-02-07 MED ORDER — INSULIN ASPART 100 UNIT/ML ~~LOC~~ SOLN: 6.0000 [IU] | Freq: Three times a day (TID) | SUBCUTANEOUS | Status: DC

## 2020-02-07 MED ORDER — VERAPAMIL HCL 2.5 MG/ML IV SOLN
INTRAVENOUS | Status: DC | PRN
  Administered 2020-02-07: 10 mL via INTRA_ARTERIAL

## 2020-02-07 MED ORDER — INSULIN ASPART 100 UNIT/ML ~~LOC~~ SOLN
0.0000 [IU] | Freq: Three times a day (TID) | SUBCUTANEOUS | Status: DC
  Administered 2020-02-07: 20 [IU] via SUBCUTANEOUS
  Administered 2020-02-08: 7 [IU] via SUBCUTANEOUS
  Administered 2020-02-08: 4 [IU] via SUBCUTANEOUS
  Administered 2020-02-08: 7 [IU] via SUBCUTANEOUS
  Administered 2020-02-09 (×2): 4 [IU] via SUBCUTANEOUS
  Administered 2020-02-10 (×2): 7 [IU] via SUBCUTANEOUS

## 2020-02-07 MED ORDER — NITROGLYCERIN 0.4 MG SL SUBL: 0.4000 mg | SUBLINGUAL_TABLET | SUBLINGUAL | Status: DC | PRN

## 2020-02-07 MED ORDER — HEPARIN (PORCINE) IN NACL 1000-0.9 UT/500ML-% IV SOLN
INTRAVENOUS | Status: AC
  Filled 2020-02-07: qty 1000

## 2020-02-07 MED ORDER — ASPIRIN EC 81 MG PO TBEC
81.0000 mg | DELAYED_RELEASE_TABLET | Freq: Every day | ORAL | Status: DC
  Administered 2020-02-08 – 2020-02-10 (×3): 81 mg via ORAL
  Filled 2020-02-07 (×4): qty 1

## 2020-02-07 MED ORDER — LORATADINE 10 MG PO TABS
10.0000 mg | ORAL_TABLET | Freq: Every day | ORAL | Status: DC
  Administered 2020-02-07 – 2020-02-10 (×4): 10 mg via ORAL
  Filled 2020-02-07 (×4): qty 1

## 2020-02-07 MED ORDER — SODIUM CHLORIDE 0.9 % IV SOLN: 250.0000 mL | INTRAVENOUS | Status: DC | PRN

## 2020-02-07 MED ORDER — SODIUM CHLORIDE 0.9 % IV SOLN: INTRAVENOUS | Status: AC

## 2020-02-07 MED ORDER — CINNAMON 500 MG PO TABS: 1000.0000 mg | ORAL_TABLET | Freq: Two times a day (BID) | ORAL | Status: DC

## 2020-02-07 MED ORDER — SODIUM CHLORIDE 0.9 % WEIGHT BASED INFUSION
3.0000 mL/kg/h | INTRAVENOUS | Status: DC
  Administered 2020-02-07: 3 mL/kg/h via INTRAVENOUS

## 2020-02-07 MED ORDER — LIDOCAINE HCL (PF) 1 % IJ SOLN
INTRAMUSCULAR | Status: DC | PRN
  Administered 2020-02-07: 2 mL

## 2020-02-07 MED ORDER — ASPIRIN 81 MG PO CHEW: 81.0000 mg | CHEWABLE_TABLET | ORAL | Status: DC

## 2020-02-07 MED ORDER — SODIUM CHLORIDE 0.9% FLUSH
3.0000 mL | Freq: Two times a day (BID) | INTRAVENOUS | Status: DC
  Administered 2020-02-08 – 2020-02-09 (×2): 3 mL via INTRAVENOUS

## 2020-02-07 MED ORDER — HEPARIN SODIUM (PORCINE) 1000 UNIT/ML IJ SOLN
INTRAMUSCULAR | Status: AC
  Filled 2020-02-07: qty 1

## 2020-02-07 MED ORDER — SODIUM CHLORIDE 0.9% FLUSH: 3.0000 mL | INTRAVENOUS | Status: DC | PRN

## 2020-02-07 MED ORDER — INSULIN GLARGINE 100 UNIT/ML ~~LOC~~ SOLN
60.0000 [IU] | Freq: Every day | SUBCUTANEOUS | Status: DC
  Administered 2020-02-07 – 2020-02-09 (×3): 60 [IU] via SUBCUTANEOUS
  Filled 2020-02-07 (×5): qty 0.6

## 2020-02-07 MED ORDER — INSULIN ASPART 100 UNIT/ML ~~LOC~~ SOLN
6.0000 [IU] | Freq: Three times a day (TID) | SUBCUTANEOUS | Status: DC
  Administered 2020-02-08 – 2020-02-10 (×5): 6 [IU] via SUBCUTANEOUS

## 2020-02-07 MED ORDER — MIDAZOLAM HCL 2 MG/2ML IJ SOLN
INTRAMUSCULAR | Status: DC | PRN
  Administered 2020-02-07: 2 mg via INTRAVENOUS

## 2020-02-07 MED ORDER — HEPARIN (PORCINE) 25000 UT/250ML-% IV SOLN
2100.0000 [IU]/h | INTRAVENOUS | Status: DC
  Administered 2020-02-08: 1750 [IU]/h via INTRAVENOUS
  Administered 2020-02-08: 1150 [IU]/h via INTRAVENOUS
  Filled 2020-02-07 (×2): qty 250

## 2020-02-07 MED ORDER — SODIUM CHLORIDE 0.9 % WEIGHT BASED INFUSION: 1.0000 mL/kg/h | INTRAVENOUS | Status: DC

## 2020-02-07 MED ORDER — METFORMIN HCL 500 MG PO TABS: 1000.0000 mg | ORAL_TABLET | Freq: Two times a day (BID) | ORAL | Status: DC

## 2020-02-07 MED ORDER — LABETALOL HCL 5 MG/ML IV SOLN: 10.0000 mg | INTRAVENOUS | Status: AC | PRN

## 2020-02-07 MED ORDER — FREESTYLE LIBRE 2 SENSOR MISC: 1.0000 | Status: DC

## 2020-02-07 MED ORDER — FENTANYL CITRATE (PF) 100 MCG/2ML IJ SOLN
INTRAMUSCULAR | Status: AC
  Filled 2020-02-07: qty 2

## 2020-02-07 MED ORDER — ATORVASTATIN CALCIUM 10 MG PO TABS
10.0000 mg | ORAL_TABLET | Freq: Every day | ORAL | Status: DC
  Administered 2020-02-07 – 2020-02-10 (×4): 10 mg via ORAL
  Filled 2020-02-07 (×4): qty 1

## 2020-02-07 MED ORDER — ACETAMINOPHEN 325 MG PO TABS: 650.0000 mg | ORAL_TABLET | ORAL | Status: DC | PRN

## 2020-02-07 MED ORDER — HYDROCODONE-ACETAMINOPHEN 5-325 MG PO TABS: 1.0000 | ORAL_TABLET | ORAL | Status: DC | PRN

## 2020-02-07 MED ORDER — MORPHINE SULFATE (PF) 2 MG/ML IV SOLN: 2.0000 mg | INTRAVENOUS | Status: DC | PRN

## 2020-02-07 MED ORDER — LISINOPRIL 20 MG PO TABS
20.0000 mg | ORAL_TABLET | Freq: Every day | ORAL | Status: DC
  Administered 2020-02-07 – 2020-02-10 (×4): 20 mg via ORAL
  Filled 2020-02-07 (×4): qty 1

## 2020-02-07 MED ORDER — ISOSORBIDE MONONITRATE ER 30 MG PO TB24
30.0000 mg | ORAL_TABLET | Freq: Every day | ORAL | Status: DC
  Administered 2020-02-07 – 2020-02-10 (×4): 30 mg via ORAL
  Filled 2020-02-07 (×4): qty 1

## 2020-02-07 MED ORDER — SEMAGLUTIDE(0.25 OR 0.5MG/DOS) 2 MG/1.5ML ~~LOC~~ SOPN
0.5000 mg | PEN_INJECTOR | SUBCUTANEOUS | Status: DC
  Administered 2020-02-08: 0.5 mg via INTRAMUSCULAR
  Filled 2020-02-07: qty 0.38

## 2020-02-07 MED ORDER — INSULIN GLARGINE 100 UNIT/ML ~~LOC~~ SOLN
40.0000 [IU] | Freq: Every day | SUBCUTANEOUS | Status: DC
  Administered 2020-02-08 – 2020-02-10 (×2): 40 [IU] via SUBCUTANEOUS
  Filled 2020-02-07 (×4): qty 0.4

## 2020-02-07 MED ORDER — HYDRALAZINE HCL 20 MG/ML IJ SOLN: 10.0000 mg | INTRAMUSCULAR | Status: AC | PRN

## 2020-02-07 MED ORDER — FUROSEMIDE 10 MG/ML IJ SOLN
40.0000 mg | Freq: Two times a day (BID) | INTRAMUSCULAR | Status: AC
  Administered 2020-02-07 – 2020-02-08 (×2): 40 mg via INTRAVENOUS
  Filled 2020-02-07 (×2): qty 4

## 2020-02-07 MED ORDER — PANTOPRAZOLE SODIUM 40 MG PO TBEC: 40.0000 mg | DELAYED_RELEASE_TABLET | Freq: Every day | ORAL | Status: DC | PRN

## 2020-02-07 MED ORDER — TAMSULOSIN HCL 0.4 MG PO CAPS
0.4000 mg | ORAL_CAPSULE | Freq: Every day | ORAL | Status: DC
  Administered 2020-02-08 – 2020-02-09 (×2): 0.4 mg via ORAL
  Filled 2020-02-07 (×2): qty 1

## 2020-02-07 MED ORDER — MELATONIN 10 MG PO TABS: 10.0000 mg | ORAL_TABLET | Freq: Every day | ORAL | Status: DC

## 2020-02-07 MED ORDER — IBUPROFEN 600 MG PO TABS: 800.0000 mg | ORAL_TABLET | Freq: Four times a day (QID) | ORAL | Status: DC | PRN

## 2020-02-07 MED ORDER — CARVEDILOL 12.5 MG PO TABS: 12.5000 mg | ORAL_TABLET | Freq: Two times a day (BID) | ORAL | Status: DC

## 2020-02-07 MED ORDER — PRASUGREL HCL 10 MG PO TABS
10.0000 mg | ORAL_TABLET | Freq: Every day | ORAL | Status: DC
  Administered 2020-02-08 – 2020-02-10 (×3): 10 mg via ORAL
  Filled 2020-02-07 (×4): qty 1

## 2020-02-07 MED ORDER — HEPARIN (PORCINE) IN NACL 1000-0.9 UT/500ML-% IV SOLN
INTRAVENOUS | Status: DC | PRN
  Administered 2020-02-07 (×2): 500 mL

## 2020-02-07 MED ORDER — MIDAZOLAM HCL 2 MG/2ML IJ SOLN
INTRAMUSCULAR | Status: AC
  Filled 2020-02-07: qty 2

## 2020-02-07 MED ORDER — ONDANSETRON HCL 4 MG/2ML IJ SOLN: 4.0000 mg | Freq: Four times a day (QID) | INTRAMUSCULAR | Status: DC | PRN

## 2020-02-07 MED ORDER — HYDROCHLOROTHIAZIDE 12.5 MG PO CAPS
12.5000 mg | ORAL_CAPSULE | Freq: Every day | ORAL | Status: DC
  Administered 2020-02-07 – 2020-02-10 (×4): 12.5 mg via ORAL
  Filled 2020-02-07 (×4): qty 1

## 2020-02-07 MED ORDER — INSULIN ASPART 100 UNIT/ML ~~LOC~~ SOLN: 0.0000 [IU] | Freq: Three times a day (TID) | SUBCUTANEOUS | Status: DC

## 2020-02-07 MED ORDER — CARVEDILOL 12.5 MG PO TABS
12.5000 mg | ORAL_TABLET | Freq: Two times a day (BID) | ORAL | Status: DC
  Administered 2020-02-08 – 2020-02-10 (×5): 12.5 mg via ORAL
  Filled 2020-02-07 (×5): qty 1

## 2020-02-07 SURGICAL SUPPLY — 9 items
CATH OPTITORQUE TIG 4.0 5F (CATHETERS) ×1 IMPLANT
DEVICE RAD COMP TR BAND LRG (VASCULAR PRODUCTS) ×1 IMPLANT
GLIDESHEATH SLEND SS 6F .021 (SHEATH) ×1 IMPLANT
GUIDEWIRE INQWIRE 1.5J.035X260 (WIRE) IMPLANT
INQWIRE 1.5J .035X260CM (WIRE) ×2
KIT HEART LEFT (KITS) ×2 IMPLANT
PACK CARDIAC CATHETERIZATION (CUSTOM PROCEDURE TRAY) ×2 IMPLANT
TRANSDUCER W/STOPCOCK (MISCELLANEOUS) ×2 IMPLANT
TUBING CIL FLEX 10 FLL-RA (TUBING) ×2 IMPLANT

## 2020-02-07 NOTE — Progress Notes (Signed)
PHARMACIST - PHYSICIAN ORDER COMMUNICATION  CONCERNING: P&T Medication Policy on Herbal Medications  DESCRIPTION:  This patient's order for:  Cinnamon  has been noted.  This product(s) is classified as an "herbal" or natural product. Due to a lack of definitive safety studies or FDA approval, nonstandard manufacturing practices, plus the potential risk of unknown drug-drug interactions while on inpatient medications, the Pharmacy and Therapeutics Committee does not permit the use of "herbal" or natural products of this type within [facility].   ACTION TAKEN: The pharmacy department is unable to verify this order at this time and your patient has been informed of this safety policy. Please reevaluate patient's clinical condition at discharge and address if the herbal or natural product(s) should be resumed at that time.

## 2020-02-07 NOTE — Progress Notes (Signed)
Patient came from cath lab alert and oriented cath site level 0, denies pain.

## 2020-02-07 NOTE — Interval H&P Note (Signed)
History and Physical Interval Note:  02/07/2020 4:08 PM  [name] [name]  has presented today for surgery, with the diagnosis of chest pain - abnormal stress test.  The various methods of treatment have been discussed with the patient and family. After consideration of risks, benefits and other options for treatment, the patient has consented to  Procedure(s): LEFT HEART CATH AND CORONARY ANGIOGRAPHY (N/A)  PERCUTANEOUS CORONARY INTERVENTION  as a surgical intervention.  The patient's history has been reviewed, patient examined, no change in status, stable for surgery.  I have reviewed the patient's chart and labs.  Questions were answered to the patient's satisfaction.    Cath Lab Visit (complete for each Cath Lab visit)  Clinical Evaluation Leading to the Procedure:   ACS: Yes.    Non-ACS:    Anginal Classification: CCS III  Anti-ischemic medical therapy: Maximal Therapy (2 or more classes of medications)  Non-Invasive Test Results: High-risk stress test findings: cardiac mortality >3%/year  Prior CABG: No previous CABG    Glenetta Hew

## 2020-02-07 NOTE — Progress Notes (Addendum)
ANTICOAGULATION CONSULT NOTE - Initial Consult  Pharmacy Consult for IV Heparin Indication: chest pain/ACS  No Known Allergies  Patient Measurements: Total Body Weight: 107.5 kg Height: 69 inches Heparin Dosing Weight: 94.1 kg  Vital Signs: Temp: 98.7 F (37.1 C) (11/10 1300) Temp Source: Oral (11/10 1300) BP: 156/70 (11/10 1725) Pulse Rate: 85 (11/10 1725)  Labs: Recent Labs    02/07/20 1828  HGB 12.8*  HCT 40.4  PLT 228    Estimated Creatinine Clearance: 67 mL/min (A) (by C-G formula based on SCr of 1.31 mg/dL (H)).   Medical History: Past Medical History:  Diagnosis Date  . Anxiety   . Coronary atherosclerosis of native coronary artery    DES distal circumflex 2005, DES LAD/diagonal bifurcation 7/12  . Essential hypertension   . GERD (gastroesophageal reflux disease)   . History of kidney stones   . Mixed hyperlipidemia   . Myocardial infarction ([place])    Anterolateral with VF arrest 7/12  . Type 2 diabetes mellitus [facility])     Assessment: 64 yr old man with hx CAD and DES in 2005, 2012 was admitted from home today, 02/07/20, for cardiac cath (F/U on abnormal stress test). Cath results: severe multi-vessel CAD; cardiac surgery has been consulted.  Pharmacy was consulted to start heparin infusion 8 hrs after sheath removal (per cath lab procedure log, sheath was removed at ~1645 PM this afternoon). Per med rec, pt was on no anticoagulants PTA (on Prasugrel).   H/H 12.8/40.4, platelets 228; per RN, no issues with bleeding observed post cath.  Goal of Therapy:  Heparin level 0.3-0.7 units/ml Monitor platelets by anticoagulation protocol: Yes   Plan:  Start heparin infusion at 1150 units/hr ~8 hrs after sheath removal (start at 0030 AM tomorrow) Check heparin level 6 hrs after starting infusion Monitor daily heparin level, CBC Monitor for signs/symptoms of bleeding  Gillermina Hu, PharmD, BCPS, [facility] Clinical Pharmacist 02/07/2020,5:55 PM

## 2020-02-08 ENCOUNTER — Encounter (HOSPITAL_COMMUNITY): Payer: Self-pay | Admitting: Cardiology

## 2020-02-08 ENCOUNTER — Inpatient Hospital Stay (HOSPITAL_COMMUNITY): Payer: Medicare PPO

## 2020-02-08 DIAGNOSIS — I25110 Atherosclerotic heart disease of native coronary artery with unstable angina pectoris: Secondary | ICD-10-CM | POA: Diagnosis not present

## 2020-02-08 DIAGNOSIS — Z794 Long term (current) use of insulin: Secondary | ICD-10-CM

## 2020-02-08 DIAGNOSIS — Z6834 Body mass index (BMI) 34.0-34.9, adult: Secondary | ICD-10-CM

## 2020-02-08 DIAGNOSIS — E6609 Other obesity due to excess calories: Secondary | ICD-10-CM

## 2020-02-08 DIAGNOSIS — I2510 Atherosclerotic heart disease of native coronary artery without angina pectoris: Secondary | ICD-10-CM

## 2020-02-08 DIAGNOSIS — E785 Hyperlipidemia, unspecified: Secondary | ICD-10-CM

## 2020-02-08 DIAGNOSIS — E118 Type 2 diabetes mellitus with unspecified complications: Secondary | ICD-10-CM | POA: Diagnosis not present

## 2020-02-08 DIAGNOSIS — E119 Type 2 diabetes mellitus without complications: Secondary | ICD-10-CM

## 2020-02-08 LAB — CBC
HCT: 38.7 % — ABNORMAL LOW (ref 39.0–52.0)
Hemoglobin: 12.3 g/dL — ABNORMAL LOW (ref 13.0–17.0)
MCH: 28.1 pg (ref 26.0–34.0)
MCHC: 31.8 g/dL (ref 30.0–36.0)
MCV: 88.4 fL (ref 80.0–100.0)
Platelets: 241 10*3/uL (ref 150–400)
RBC: 4.38 MIL/uL (ref 4.22–5.81)
RDW: 13.6 % (ref 11.5–15.5)
WBC: 7.9 10*3/uL (ref 4.0–10.5)
nRBC: 0 % (ref 0.0–0.2)

## 2020-02-08 LAB — HEPARIN LEVEL (UNFRACTIONATED)
Heparin Unfractionated: <0.1 IU/mL — ABNORMAL LOW (ref 0.30–0.70)
Heparin Unfractionated: 0.13 IU/mL — ABNORMAL LOW (ref 0.30–0.70)
Heparin Unfractionated: 0.24 IU/mL — ABNORMAL LOW (ref 0.30–0.70)

## 2020-02-08 LAB — BASIC METABOLIC PANEL
Anion gap: 12 (ref 5–15)
BUN: 22 mg/dL (ref 8–23)
CO2: 26 mmol/L (ref 22–32)
Calcium: 8.8 mg/dL — ABNORMAL LOW (ref 8.9–10.3)
Chloride: 100 mmol/L (ref 98–111)
Creatinine, Ser: 1.09 mg/dL (ref 0.61–1.24)
GFR, Estimated: >60 mL/min (ref >60)
Glucose, Bld: 198 mg/dL — ABNORMAL HIGH (ref 70–99)
Potassium: 3.8 mmol/L (ref 3.5–5.1)
Sodium: 138 mmol/L (ref 135–145)

## 2020-02-08 LAB — LIPID PANEL
Cholesterol: 89 mg/dL (ref 0–200)
HDL: 16 mg/dL — ABNORMAL LOW (ref >40)
LDL Cholesterol: 35 mg/dL (ref 0–99)
Total CHOL/HDL Ratio: 5.6 RATIO
Triglycerides: 188 mg/dL — ABNORMAL HIGH (ref <150)
VLDL: 38 mg/dL (ref 0–40)

## 2020-02-08 LAB — GLUCOSE, CAPILLARY
Glucose-Capillary: 168 mg/dL — ABNORMAL HIGH (ref 70–99)
Glucose-Capillary: 205 mg/dL — ABNORMAL HIGH (ref 70–99)
Glucose-Capillary: 205 mg/dL — ABNORMAL HIGH (ref 70–99)
Glucose-Capillary: 214 mg/dL — ABNORMAL HIGH (ref 70–99)
Glucose-Capillary: 232 mg/dL — ABNORMAL HIGH (ref 70–99)

## 2020-02-08 LAB — ECHOCARDIOGRAM LIMITED
S' Lateral: 2.9 cm
Weight: 3728 oz

## 2020-02-08 MED ORDER — SODIUM CHLORIDE 0.9 % IV SOLN: INTRAVENOUS | Status: DC

## 2020-02-08 MED ORDER — SODIUM CHLORIDE 0.9% FLUSH: 3.0000 mL | INTRAVENOUS | Status: DC | PRN

## 2020-02-08 MED ORDER — SODIUM CHLORIDE 0.9 % IV SOLN: 250.0000 mL | INTRAVENOUS | Status: DC | PRN

## 2020-02-08 MED ORDER — SODIUM CHLORIDE 0.9% FLUSH: 3.0000 mL | Freq: Two times a day (BID) | INTRAVENOUS | Status: DC

## 2020-02-08 NOTE — Progress Notes (Signed)
Received report from nurse and will be resuming care for patient. Patient resting comfortably and says he is fine. Will continue to monitor.

## 2020-02-08 NOTE — H&P (View-Only) (Signed)
Progress Note  Patient Name: [name] [name] Date of Encounter: 02/08/2020  Primary Cardiologist: Rozann Lesches, MD  Subjective   LHC yesterday with severe CAD with plans for either TCTS consultation versus arthrectomy and PCI per Dr. Fletcher Anon pending echocardiogram results.   Inpatient Medications    Scheduled Meds: . aspirin EC  81 mg Oral Daily  . atorvastatin  10 mg Oral Daily  . carvedilol  12.5 mg Oral BID WC  . lisinopril  20 mg Oral Daily   And  . hydrochlorothiazide  12.5 mg Oral Daily  . insulin aspart  0-20 Units Subcutaneous TID WC  . insulin aspart  6 Units Subcutaneous TID WC  . insulin glargine  40 Units Subcutaneous Daily  . insulin glargine  60 Units Subcutaneous QHS  . isosorbide mononitrate  30 mg Oral Daily  . loratadine  10 mg Oral Daily  . melatonin  10 mg Oral QHS  . prasugrel  10 mg Oral Daily  . Semaglutide(0.25 or 0.5MG /DOS)  0.5 mg Injection Q Thu  . sodium chloride flush  3 mL Intravenous Q12H  . tamsulosin  0.4 mg Oral QHS   Continuous Infusions: . sodium chloride    . heparin 1,450 Units/hr (02/08/20 0823)   PRN Meds: sodium chloride, acetaminophen, HYDROcodone-acetaminophen, morphine injection, nitroGLYCERIN, ondansetron (ZOFRAN) IV, pantoprazole, sodium chloride flush   Vital Signs    Vitals:   02/08/20 0030 02/08/20 0447 02/08/20 0448 02/08/20 0732  BP:  (!) 86/53 (!) 89/57 (!) 110/59  Pulse:  88 86 82  Resp:  17  18  Temp:  98.4 F (36.9 C)  98.7 F (37.1 C)  TempSrc:  Oral  Oral  SpO2:  94% 96% 94%  Weight: 105.7 kg       Intake/Output Summary (Last 24 hours) at 02/08/2020 1052 Last data filed at 02/08/2020 0830 Gross per 24 hour  Intake 811.16 ml  Output 2100 ml  Net -1288.84 ml   Filed Weights   02/08/20 0030  Weight: 105.7 kg    Physical Exam   General: Well developed, well nourished, NAD Neck: Negative for carotid bruits. No JVD Lungs:Clear to ausculation bilaterally. No wheezes, rales, or rhonchi.  Breathing is unlabored. Cardiovascular: RRR with S1 S2. No murmurs Abdomen: Soft, non-tender, non-distended. No obvious abdominal masses. Extremities: No edema. Radial pulses 2+ bilaterally Neuro: Alert and oriented. No focal deficits. No facial asymmetry. MAE spontaneously. Psych: Responds to questions appropriately with normal affect.    Labs    Chemistry Recent Labs  Lab 02/08/20 0626  NA 138  K 3.8  CL 100  CO2 26  GLUCOSE 198*  BUN 22  CREATININE 1.09  CALCIUM 8.8*  GFRNONAA >60  ANIONGAP 12     Hematology Recent Labs  Lab 02/07/20 1828 02/08/20 0626  WBC 7.1 7.9  RBC 4.55 4.38  HGB 12.8* 12.3*  HCT 40.4 38.7*  MCV 88.8 88.4  MCH 28.1 28.1  MCHC 31.7 31.8  RDW 13.8 13.6  PLT 228 241    Cardiac EnzymesNo results for input(s): TROPONINI in the last 168 hours. No results for input(s): TROPIPOC in the last 168 hours.   BNPNo results for input(s): BNP, PROBNP in the last 168 hours.   DDimer No results for input(s): DDIMER in the last 168 hours.   Radiology    CARDIAC CATHETERIZATION  Result Date: 02/07/2020  Ostial LAD lesion is 95% stenosed. (Image does not allow drawing ostial LAD lesions)  Prox LAD to Mid LAD lesion  is 20% stenosed. Mid LAD STENT is 20% stenosed  Dist LAD lesion is 60% stenosed.  Very small caliber 1st Mrg lesion is 85% stenosed. No change from 2012  LPAV-1 lesion is 30% stenosed just prior to giving off LPL 1.  LPAV-2 STENT is 100% stenosed -which leaves the L PDA occluded  1st LPL-1 lesion is 95% stenosed.  1st LPL-2 lesion is 80% stenosed.  Prox RCA lesion is 70% stenosed.  ----------------------------  The left ventricular ejection fraction is 35-45% by visual estimate.  There is moderate left ventricular systolic dysfunction.  LV end diastolic pressure is normal.  SUMMARY  Severe multivessel CAD:  Heavily calcified eccentric 95% ostial LAD with mild in-stent restenosis of proximal-mid LAD stent and downstream diffuse mild to  moderate disease with a focal 60% lesion;  small caliber ramus intermedius mild diffuse disease, heavily diseased OM1 (no change from 2012).   Very patulous dilated dominant LCx:  100% CTO of previously placed Stent leading from the AV groove circumflex to the PDA (no collateral)  95% mid LPL stenosis.  Nondominant RCA with proximal 70%.  At least moderately reduced LVEF estimated roughly 45% with apical anterior hypokinesis.  Severely elevated LVEDP - 28 mm. RECOMMENDATIONS  Very difficult situation with significant disease.  Need to review with heart team approach discussed between CVTS surgeons and other [place] physicians.  Brief discussion with Dr. Servando Snare would indicate that only the distal LAD would be a target for LIMA.  Dr. Fletcher Anon feels that the ostial LAD could be treated with atherectomy and stenting as could the LPL lesion.  Perhaps this would be a better option, however we need to assess EF.  Anticipate full CVTS consultation note tomorrow.  We will hold off on stopping Effient until the decision is made about CABG versus PCI  If CABG: Would not be until Wednesday 11/17  If PCI: Would wait until Friday 11/12 with Dr. Fletcher Anon -> depending on EF, may or may not need to consider Impella support  With unstable angina presentation-chest pain and dyspnea with minimal exertion, I do not feel comfortable discharging this gentleman till a full plan is in place.  We will admit him to telemetry on the Interventional Team Service to allow time for HEART TEAM DISCUSSION for best option going forward.  Check 2D echocardiogram  The patient has a relatively difficult diabetes regimen, will start with a Lantus (at the lower end of his range) with mealtime and sliding scale coverage.  Diabetes education team to assist since he has a [employer]' monitor.  Continue to titrate antianginals per IC Team (Team C) Glenetta Hew, MD  Telemetry    02/08/20 NSR - Personally Reviewed  ECG    No new tracing as of  02/08/20- Personally Reviewed  Cardiac Studies   LHC 02/07/20:   Ostial LAD lesion is 95% stenosed. (Image does not allow drawing ostial LAD lesions)  Prox LAD to Mid LAD lesion is 20% stenosed. Mid LAD STENT is 20% stenosed  Dist LAD lesion is 60% stenosed.  Very small caliber 1st Mrg lesion is 85% stenosed. No change from 2012  LPAV-1 lesion is 30% stenosed just prior to giving off LPL 1.  LPAV-2 STENT is 100% stenosed -which leaves the L PDA occluded  1st LPL-1 lesion is 95% stenosed.  1st LPL-2 lesion is 80% stenosed.  Prox RCA lesion is 70% stenosed.  ----------------------------  The left ventricular ejection fraction is 35-45% by visual estimate.  There is moderate left ventricular systolic  dysfunction.  LV end diastolic pressure is normal.   SUMMARY  Severe multivessel CAD:  ? Heavily calcified eccentric 95% ostial LAD with mild in-stent restenosis of proximal-mid LAD stent and downstream diffuse mild to moderate disease with a focal 60% lesion;  ? small caliber ramus intermedius mild diffuse disease, heavily diseased OM1 (no change from 2012).   ? Very patulous dilated dominant LCx:  100% CTO of previously placed Stent leading from the AV groove circumflex to the PDA (no collateral)   95% mid LPL stenosis.  Nondominant RCA with proximal 70%.  At least moderately reduced LVEF estimated roughly 45% with apical anterior hypokinesis.  Severely elevated LVEDP - 28 mm.   RECOMMENDATIONS  Very difficult situation with significant disease.  Need to review with heart team approach discussed between CVTS surgeons and other [place] physicians.  Brief discussion with Dr. Servando Snare would indicate that only the distal LAD would be a target for LIMA.  Dr. Fletcher Anon feels that the ostial LAD could be treated with atherectomy and stenting as could the LPL lesion.  Perhaps this would be a better option, however we need to assess EF. ? Anticipate full CVTS consultation note  tomorrow.  We will hold off on stopping Effient until the decision is made about CABG versus PCI  If CABG: Would not be until Wednesday 11/17  If PCI: Would wait until Friday 11/12 with Dr. Fletcher Anon -> depending on EF, may or may not need to consider Impella support  With unstable angina presentation-chest pain and dyspnea with minimal exertion, I do not feel comfortable discharging this gentleman till a full plan is in place.  We will admit him to telemetry on the Interventional Team Service to allow time for HEART TEAM DISCUSSION for best option going forward.  Check 2D echocardiogram  The patient has a relatively difficult diabetes regimen, will start with a Lantus (at the lower end of his range) with mealtime and sliding scale coverage.  Diabetes education team to assist since he has a [employer]' monitor.  Continue to titrate antianginals per IC Team (Team C)   Diagnostic Dominance: Left    Echocardiogram 02/08/20: Pending results    Lexiscan stress test 02/02/2020   There was no ST segment deviation noted during stress.  Findings consistent with moderate mid to distal anterior/anteroapical ischemial. Large are of inferior/inferolateral ischemia. Both defects are nearly completely reversible.  This is a high risk study. Risk based on decreased LVEF as well as two significant areas of ischemia.  The left ventricular ejection fraction is moderately decreased (30-44%).  Lexiscan Myoview 04/29/2017:  No diagnostic ST segment changes to indicate ischemia. Frequent PVCs.  Minor, mild intensity, apical inferior defect that is partially reversible and consistent with a very small ischemic territory.  This is a low risk study based on perfusion imaging.  Nuclear stress EF: 39%. Visually LVEF looks to be normal - suggest echocardiogram to confirm.  Echocardiogram 05/09/2018: 1. The left ventricle has normal systolic function of 86-75%. The cavity  size was normal. There  is moderately increased left ventricular wall  thickness. Echo evidence of impaired diastolic relaxation Elevated mean  left atrial pressure.  2. The right ventricle has normal systolic function. The cavity was  normal. There is no increase in right ventricular wall thickness.  3. The mitral valve is normal in structure. There is mild thickening. No  evidence of mitral valve stenosis.  4. The tricuspid valve is normal in structure.  5. The aortic valve is tricuspid  There is mild thickening of the aortic  valve.  6. The aortic root is normal in size and structure.  7. There is mild dilatation of the ascending aorta.  8. The interatrial septum was not well visualized   Patient Profile   64 y.o. male with a history of CAD ( DES Lcx 2005, DES LAD/diagonal 2012), HTN, HLD, GERD, DM2. LHC with severe CAD Very difficult situation with significant disease.  Need to review with heart team approach discussed between CVTS surgeons and other [place] physicians.  Brief discussion with Dr. Servando Snare would indicate that only the distal LAD would be a target for LIMA.  Dr. Fletcher Anon feels that the ostial LAD could be treated with atherectomy and stenting as could the LPL lesion. Perhaps this would be a better option, however we need to assess EF  Assessment & Plan    1. Severe CAD per LHC with hx of DES to LCX 2005, DES to LAD/diag 2012: -OP stress test performed in the setting of worsening exertional symptoms found to be abnormal therefore plan was for [facility] to further evaluate coronary anatomy. LHC performed 02/07/20 which showed a heavily calcified eccentric 95% ostial LAD lesion with mild in-stent restenosis of the prox>>mid LAD, heavily diseased OM1 (no change from 2012) and 100% CTO of a previously placed stent leading from AV groove LCX to the PDA with no collaterals and a 95% mid LPL with a nondominant RCA with pRCA 70% stenosis.  -EF visually estimated at 45% with apical hypokinesis with pending result  with subsequent echocardiogram  -Plan is to obtain echo for full LV estimate and formal TCTS consultation. Dr. Ellyn Hack spoke with both Dr. Servando Snare who indicated only distal LAD would be a target for LIMA. Dr. Fletcher Anon indicated he could possibly undergo ostial LAD arthrectomy with stenting to the LPL -He was continued on Effient for now until final TCTS decision   -LVEDP elevated at 28 -Continue ASA, carvedilol, statin, Effient  -Currently on IMDUR 30 with no chest pain   2. HLD: -Last LDL, 35 on 02/08/20 -Continue current regimen   3. HTN: -Stable, 110/59>>>89/57>>>86/53>>126/72 -Continue current regimen   4. DM2: -Uncontrolled, last HbA1c noted to be 7.6  -SSI for glucose control while inpatient status  -Started on Lantus as well   Signed, Kathyrn Drown NP-C [facility] Pager: [phone] 02/08/2020, 10:52 AM     Patient seen and examined. Agree with assessment and plan.  Patient is currently pain-free but he has experienced a several month history of exertional anginal symptomatology.  I personally reviewed the angiographic images.  Ostial LAD is severely calcified with 95% stenosis.  There is also significant stenosis in the circumflex distally.  I have discussed with Dr. Ellyn Hack.  Dr. Audelia Acton had reviewed the angiograms yesterday.  Patient will undergo 2D echo Doppler study today.  Tentative plan is to pursue atherectomy/DES stenting of the LAD ostium tomorrow with Dr. Audelia Acton per Dr. Ellyn Hack.  Patient is aware of the risk/benefits of the procedure.  He continues to be on IV heparin therapy, prasugrel, aspirin, carvedilol and low-dose isosorbide.   Troy Sine, MD, [facility] 02/08/2020 11:43 AM  For questions or updates, please contact   Please consult [email or web address] for contact info under Cardiology/STEMI.

## 2020-02-08 NOTE — Progress Notes (Addendum)
Progress Note  Patient Name: [name] [name] Date of Encounter: 02/08/2020  Primary Cardiologist: Rozann Lesches, MD  Subjective   LHC yesterday with severe CAD with plans for either TCTS consultation versus arthrectomy and PCI per Dr. Fletcher Anon pending echocardiogram results.   Inpatient Medications    Scheduled Meds: . aspirin EC  81 mg Oral Daily  . atorvastatin  10 mg Oral Daily  . carvedilol  12.5 mg Oral BID WC  . lisinopril  20 mg Oral Daily   And  . hydrochlorothiazide  12.5 mg Oral Daily  . insulin aspart  0-20 Units Subcutaneous TID WC  . insulin aspart  6 Units Subcutaneous TID WC  . insulin glargine  40 Units Subcutaneous Daily  . insulin glargine  60 Units Subcutaneous QHS  . isosorbide mononitrate  30 mg Oral Daily  . loratadine  10 mg Oral Daily  . melatonin  10 mg Oral QHS  . prasugrel  10 mg Oral Daily  . Semaglutide(0.25 or 0.5MG /DOS)  0.5 mg Injection Q Thu  . sodium chloride flush  3 mL Intravenous Q12H  . tamsulosin  0.4 mg Oral QHS   Continuous Infusions: . sodium chloride    . heparin 1,450 Units/hr (02/08/20 0823)   PRN Meds: sodium chloride, acetaminophen, HYDROcodone-acetaminophen, morphine injection, nitroGLYCERIN, ondansetron (ZOFRAN) IV, pantoprazole, sodium chloride flush   Vital Signs    Vitals:   02/08/20 0030 02/08/20 0447 02/08/20 0448 02/08/20 0732  BP:  (!) 86/53 (!) 89/57 (!) 110/59  Pulse:  88 86 82  Resp:  17  18  Temp:  98.4 F (36.9 C)  98.7 F (37.1 C)  TempSrc:  Oral  Oral  SpO2:  94% 96% 94%  Weight: 105.7 kg       Intake/Output Summary (Last 24 hours) at 02/08/2020 1052 Last data filed at 02/08/2020 0830 Gross per 24 hour  Intake 811.16 ml  Output 2100 ml  Net -1288.84 ml   Filed Weights   02/08/20 0030  Weight: 105.7 kg    Physical Exam   General: Well developed, well nourished, NAD Neck: Negative for carotid bruits. No JVD Lungs:Clear to ausculation bilaterally. No wheezes, rales, or rhonchi.  Breathing is unlabored. Cardiovascular: RRR with S1 S2. No murmurs Abdomen: Soft, non-tender, non-distended. No obvious abdominal masses. Extremities: No edema. Radial pulses 2+ bilaterally Neuro: Alert and oriented. No focal deficits. No facial asymmetry. MAE spontaneously. Psych: Responds to questions appropriately with normal affect.    Labs    Chemistry Recent Labs  Lab 02/08/20 0626  NA 138  K 3.8  CL 100  CO2 26  GLUCOSE 198*  BUN 22  CREATININE 1.09  CALCIUM 8.8*  GFRNONAA >60  ANIONGAP 12     Hematology Recent Labs  Lab 02/07/20 1828 02/08/20 0626  WBC 7.1 7.9  RBC 4.55 4.38  HGB 12.8* 12.3*  HCT 40.4 38.7*  MCV 88.8 88.4  MCH 28.1 28.1  MCHC 31.7 31.8  RDW 13.8 13.6  PLT 228 241    Cardiac EnzymesNo results for input(s): TROPONINI in the last 168 hours. No results for input(s): TROPIPOC in the last 168 hours.   BNPNo results for input(s): BNP, PROBNP in the last 168 hours.   DDimer No results for input(s): DDIMER in the last 168 hours.   Radiology    CARDIAC CATHETERIZATION  Result Date: 02/07/2020  Ostial LAD lesion is 95% stenosed. (Image does not allow drawing ostial LAD lesions)  Prox LAD to Mid LAD lesion  is 20% stenosed. Mid LAD STENT is 20% stenosed  Dist LAD lesion is 60% stenosed.  Very small caliber 1st Mrg lesion is 85% stenosed. No change from 2012  LPAV-1 lesion is 30% stenosed just prior to giving off LPL 1.  LPAV-2 STENT is 100% stenosed -which leaves the L PDA occluded  1st LPL-1 lesion is 95% stenosed.  1st LPL-2 lesion is 80% stenosed.  Prox RCA lesion is 70% stenosed.  ----------------------------  The left ventricular ejection fraction is 35-45% by visual estimate.  There is moderate left ventricular systolic dysfunction.  LV end diastolic pressure is normal.  SUMMARY  Severe multivessel CAD:  Heavily calcified eccentric 95% ostial LAD with mild in-stent restenosis of proximal-mid LAD stent and downstream diffuse mild to  moderate disease with a focal 60% lesion;  small caliber ramus intermedius mild diffuse disease, heavily diseased OM1 (no change from 2012).   Very patulous dilated dominant LCx:  100% CTO of previously placed Stent leading from the AV groove circumflex to the PDA (no collateral)  95% mid LPL stenosis.  Nondominant RCA with proximal 70%.  At least moderately reduced LVEF estimated roughly 45% with apical anterior hypokinesis.  Severely elevated LVEDP - 28 mm. RECOMMENDATIONS  Very difficult situation with significant disease.  Need to review with heart team approach discussed between CVTS surgeons and other [place] physicians.  Brief discussion with Dr. Servando Snare would indicate that only the distal LAD would be a target for LIMA.  Dr. Fletcher Anon feels that the ostial LAD could be treated with atherectomy and stenting as could the LPL lesion.  Perhaps this would be a better option, however we need to assess EF.  Anticipate full CVTS consultation note tomorrow.  We will hold off on stopping Effient until the decision is made about CABG versus PCI  If CABG: Would not be until Wednesday 11/17  If PCI: Would wait until Friday 11/12 with Dr. Fletcher Anon -> depending on EF, may or may not need to consider Impella support  With unstable angina presentation-chest pain and dyspnea with minimal exertion, I do not feel comfortable discharging this gentleman till a full plan is in place.  We will admit him to telemetry on the Interventional Team Service to allow time for HEART TEAM DISCUSSION for best option going forward.  Check 2D echocardiogram  The patient has a relatively difficult diabetes regimen, will start with a Lantus (at the lower end of his range) with mealtime and sliding scale coverage.  Diabetes education team to assist since he has a [employer]' monitor.  Continue to titrate antianginals per IC Team (Team C) Glenetta Hew, MD  Telemetry    02/08/20 NSR - Personally Reviewed  ECG    No new tracing as of  02/08/20- Personally Reviewed  Cardiac Studies   LHC 02/07/20:   Ostial LAD lesion is 95% stenosed. (Image does not allow drawing ostial LAD lesions)  Prox LAD to Mid LAD lesion is 20% stenosed. Mid LAD STENT is 20% stenosed  Dist LAD lesion is 60% stenosed.  Very small caliber 1st Mrg lesion is 85% stenosed. No change from 2012  LPAV-1 lesion is 30% stenosed just prior to giving off LPL 1.  LPAV-2 STENT is 100% stenosed -which leaves the L PDA occluded  1st LPL-1 lesion is 95% stenosed.  1st LPL-2 lesion is 80% stenosed.  Prox RCA lesion is 70% stenosed.  ----------------------------  The left ventricular ejection fraction is 35-45% by visual estimate.  There is moderate left ventricular systolic  dysfunction.  LV end diastolic pressure is normal.   SUMMARY  Severe multivessel CAD:  ? Heavily calcified eccentric 95% ostial LAD with mild in-stent restenosis of proximal-mid LAD stent and downstream diffuse mild to moderate disease with a focal 60% lesion;  ? small caliber ramus intermedius mild diffuse disease, heavily diseased OM1 (no change from 2012).   ? Very patulous dilated dominant LCx:  100% CTO of previously placed Stent leading from the AV groove circumflex to the PDA (no collateral)   95% mid LPL stenosis.  Nondominant RCA with proximal 70%.  At least moderately reduced LVEF estimated roughly 45% with apical anterior hypokinesis.  Severely elevated LVEDP - 28 mm.   RECOMMENDATIONS  Very difficult situation with significant disease.  Need to review with heart team approach discussed between CVTS surgeons and other [place] physicians.  Brief discussion with Dr. Servando Snare would indicate that only the distal LAD would be a target for LIMA.  Dr. Fletcher Anon feels that the ostial LAD could be treated with atherectomy and stenting as could the LPL lesion.  Perhaps this would be a better option, however we need to assess EF. ? Anticipate full CVTS consultation note  tomorrow.  We will hold off on stopping Effient until the decision is made about CABG versus PCI  If CABG: Would not be until Wednesday 11/17  If PCI: Would wait until Friday 11/12 with Dr. Fletcher Anon -> depending on EF, may or may not need to consider Impella support  With unstable angina presentation-chest pain and dyspnea with minimal exertion, I do not feel comfortable discharging this gentleman till a full plan is in place.  We will admit him to telemetry on the Interventional Team Service to allow time for HEART TEAM DISCUSSION for best option going forward.  Check 2D echocardiogram  The patient has a relatively difficult diabetes regimen, will start with a Lantus (at the lower end of his range) with mealtime and sliding scale coverage.  Diabetes education team to assist since he has a [employer]' monitor.  Continue to titrate antianginals per IC Team (Team C)   Diagnostic Dominance: Left    Echocardiogram 02/08/20: Pending results    Lexiscan stress test 02/02/2020   There was no ST segment deviation noted during stress.  Findings consistent with moderate mid to distal anterior/anteroapical ischemial. Large are of inferior/inferolateral ischemia. Both defects are nearly completely reversible.  This is a high risk study. Risk based on decreased LVEF as well as two significant areas of ischemia.  The left ventricular ejection fraction is moderately decreased (30-44%).  Lexiscan Myoview 04/29/2017:  No diagnostic ST segment changes to indicate ischemia. Frequent PVCs.  Minor, mild intensity, apical inferior defect that is partially reversible and consistent with a very small ischemic territory.  This is a low risk study based on perfusion imaging.  Nuclear stress EF: 39%. Visually LVEF looks to be normal - suggest echocardiogram to confirm.  Echocardiogram 05/09/2018: 1. The left ventricle has normal systolic function of 03-47%. The cavity  size was normal. There  is moderately increased left ventricular wall  thickness. Echo evidence of impaired diastolic relaxation Elevated mean  left atrial pressure.  2. The right ventricle has normal systolic function. The cavity was  normal. There is no increase in right ventricular wall thickness.  3. The mitral valve is normal in structure. There is mild thickening. No  evidence of mitral valve stenosis.  4. The tricuspid valve is normal in structure.  5. The aortic valve is tricuspid  There is mild thickening of the aortic  valve.  6. The aortic root is normal in size and structure.  7. There is mild dilatation of the ascending aorta.  8. The interatrial septum was not well visualized   Patient Profile   64 y.o. male with a history of CAD ( DES Lcx 2005, DES LAD/diagonal 2012), HTN, HLD, GERD, DM2. LHC with severe CAD Very difficult situation with significant disease.  Need to review with heart team approach discussed between CVTS surgeons and other [place] physicians.  Brief discussion with Dr. Servando Snare would indicate that only the distal LAD would be a target for LIMA.  Dr. Fletcher Anon feels that the ostial LAD could be treated with atherectomy and stenting as could the LPL lesion. Perhaps this would be a better option, however we need to assess EF  Assessment & Plan    1. Severe CAD per LHC with hx of DES to LCX 2005, DES to LAD/diag 2012: -OP stress test performed in the setting of worsening exertional symptoms found to be abnormal therefore plan was for [facility] to further evaluate coronary anatomy. LHC performed 02/07/20 which showed a heavily calcified eccentric 95% ostial LAD lesion with mild in-stent restenosis of the prox>>mid LAD, heavily diseased OM1 (no change from 2012) and 100% CTO of a previously placed stent leading from AV groove LCX to the PDA with no collaterals and a 95% mid LPL with a nondominant RCA with pRCA 70% stenosis.  -EF visually estimated at 45% with apical hypokinesis with pending result  with subsequent echocardiogram  -Plan is to obtain echo for full LV estimate and formal TCTS consultation. Dr. Ellyn Hack spoke with both Dr. Servando Snare who indicated only distal LAD would be a target for LIMA. Dr. Fletcher Anon indicated he could possibly undergo ostial LAD arthrectomy with stenting to the LPL -He was continued on Effient for now until final TCTS decision   -LVEDP elevated at 28 -Continue ASA, carvedilol, statin, Effient  -Currently on IMDUR 30 with no chest pain   2. HLD: -Last LDL, 35 on 02/08/20 -Continue current regimen   3. HTN: -Stable, 110/59>>>89/57>>>86/53>>126/72 -Continue current regimen   4. DM2: -Uncontrolled, last HbA1c noted to be 7.6  -SSI for glucose control while inpatient status  -Started on Lantus as well   Signed, Kathyrn Drown NP-C [facility] Pager: [phone] 02/08/2020, 10:52 AM     Patient seen and examined. Agree with assessment and plan.  Patient is currently pain-free but he has experienced a several month history of exertional anginal symptomatology.  I personally reviewed the angiographic images.  Ostial LAD is severely calcified with 95% stenosis.  There is also significant stenosis in the circumflex distally.  I have discussed with Dr. Ellyn Hack.  Dr. Audelia Acton had reviewed the angiograms yesterday.  Patient will undergo 2D echo Doppler study today.  Tentative plan is to pursue atherectomy/DES stenting of the LAD ostium tomorrow with Dr. Audelia Acton per Dr. Ellyn Hack.  Patient is aware of the risk/benefits of the procedure.  He continues to be on IV heparin therapy, prasugrel, aspirin, carvedilol and low-dose isosorbide.   Troy Sine, MD, [facility] 02/08/2020 11:43 AM  For questions or updates, please contact   Please consult [email or web address] for contact info under Cardiology/STEMI.

## 2020-02-08 NOTE — Consult Note (Addendum)
[address]       [place] [address]             [phone]        Rigel W Kassabian [facility] Medical Record #[id number] Date of Birth: 05/12/1955  Referring: Dr Ellyn Hack  Primary Care: Chevis Pretty, FNP Primary Cardiologist:Samuel Domenic Polite, MD  Chief Complaint:  Chest pain, SOB   History of Present Illness:      [name] [name] is a 64 year old male patient who has been followed by cardiology outpatient.  His last cardiology appointment was in February of this year in which she did not have any chest pain or shortness of breath.  He did have his nitroglycerin refilled at this time after he recently injured his knee.  He has a known past medical history significant for diabetes mellitus type 2, previous carotid stent x 2 on Effient preadmission, previous coronary DES in 2005 and 2012,  vfib arrest associated with his second stent ,obesity, essential hypertension, hyperlipidemia, BPH, and GERD.  The patient called his cardiologist office on 01/31/2020 complaining of chest pain and shortness of breath after walking to the mailbox.  He stated that he had been having on and off chest pain but it recently worsened.  He now complains of intermittent sharp chest pain with exertion.  It was recommended that he underwent a cardiac catheterization.  He underwent cardiac catheterization on 02/07/2020 which showed severe multivessel coronary artery disease, and moderately reduced LVEF estimated to be 45% with apical anterior hypokinesis.   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  He lives in [place] with his wife.    Current Activity/ Functional Status: Patient was independent with mobility/ambulation, transfers, ADL's, IADL's.   Zubrod Score: At the time of surgery this patient's most appropriate activity status/level should be described as: '[]'     0    Normal activity, no symptoms '[x]'     1     Restricted in physical strenuous activity but ambulatory, able to do out light work '[]'     2    Ambulatory and capable of self care, unable to do work activities, up and about                 more than 50%  Of the time                            '[]'     3    Only limited self care, in bed greater than 50% of waking hours '[]'     4    Completely disabled, no self care, confined to bed or chair '[]'     5    Moribund  Past Medical History:  Diagnosis Date  . Anxiety   . Coronary atherosclerosis of native coronary artery    DES distal circumflex 2005, DES LAD/diagonal bifurcation 7/12  . Essential hypertension   . GERD (gastroesophageal reflux disease)   . History of kidney stones   . Mixed hyperlipidemia   . Myocardial infarction ([place])    Anterolateral with VF arrest 7/12  . Type 2 diabetes mellitus ([place])     Past Surgical History:  Procedure Laterality Date  . CARDIAC CATHETERIZATION  2012  . CAROTID STENT     stents x 2   . CHOLECYSTECTOMY N/A 12/04/2015   Procedure: LAPAROSCOPIC CHOLECYSTECTOMY;  Surgeon: Aviva Signs, MD;  Location: [facility];  Service: General;  Laterality: N/A;  . CHOLECYSTECTOMY, LAPAROSCOPIC  12/05/2015  . CORONARY ANGIOPLASTY  2012   STENT X 1 2012, STENT X 1 YRS BEFORE  . HYDROCELE EXCISION Bilateral 06/02/2017   Procedure: HYDROCELECTOMY ADULT;  Surgeon: Ceasar Mons, MD;  Location: [facility];  Service: Urology;  Laterality: Bilateral;  ONLY NEEDS 45 MIN  . INGUINAL HERNIA REPAIR     RIGHT GROIN  . KNEE ARTHROSCOPY Left 05/23/2019   Procedure: LEFT KNEE ARTHROSCOPY AND DEBRIDEMENT PARTIAL MEDIAL MENISECTOMY;  Surgeon: Newt Minion, MD;  Location: [facility];  Service: Orthopedics;  Laterality: Left;  . LEFT HEART CATH AND CORONARY ANGIOGRAPHY N/A 02/07/2020   Procedure: LEFT HEART CATH AND CORONARY ANGIOGRAPHY;  Surgeon: Leonie Man, MD;  Location: [facility];  Service: Cardiovascular;  Laterality: N/A;  . TRIGGER FINGER RELEASE  Right 01/08/2015   Procedure: RELEASE TRIGGER FINGER/A-1 PULLEY RIGHT MIDDLE FINGER;  Surgeon: Daryll Brod, MD;  Location: [facility];  Service: Orthopedics;  Laterality: Right;    Social History   Tobacco Use  Smoking Status Never Smoker  Smokeless Tobacco Never Used    Social History   Substance and Sexual Activity  Alcohol Use No     No Known Allergies  Current Facility-Administered Medications  Medication Dose Route Frequency Provider Last Rate Last Admin  . 0.9 %  sodium chloride infusion  250 mL Intravenous PRN Leonie Man, MD      . acetaminophen (TYLENOL) tablet 650 mg  650 mg Oral Q4H PRN Leonie Man, MD      . aspirin EC tablet 81 mg  81 mg Oral Daily Leonie Man, MD   81 mg at 02/08/20 0824  . atorvastatin (LIPITOR) tablet 10 mg  10 mg Oral Daily Leonie Man, MD   10 mg at 02/08/20 0826  . carvedilol (COREG) tablet 12.5 mg  12.5 mg Oral BID WC Leonie Man, MD   12.5 mg at 02/08/20 0825  . heparin ADULT infusion 100 units/mL (25000 units/252m sodium chloride 0.45%)  1,450 Units/hr Intravenous Continuous HLeonie Man MD 14.5 mL/hr at 02/08/20 0823 1,450 Units/hr at 02/08/20 0823  . lisinopril (ZESTRIL) tablet 20 mg  20 mg Oral Daily HLeonie Man MD   20 mg at 02/08/20 01610  And  . hydrochlorothiazide (MICROZIDE) capsule 12.5 mg  12.5 mg Oral Daily HLeonie Man MD   12.5 mg at 02/08/20 0825  . HYDROcodone-acetaminophen (NORCO/VICODIN) 5-325 MG per tablet 1-2 tablet  1-2 tablet Oral Q4H PRN HLeonie Man MD      . insulin aspart (novoLOG) injection 0-20 Units  0-20 Units Subcutaneous TID WC UClois Dupes MD   7 Units at 02/08/20 1202  . insulin aspart (novoLOG) injection 6 Units  6 Units Subcutaneous TID WC HLeonie Man MD   6 Units at 02/08/20 1202  . insulin glargine (LANTUS) injection 40 Units  40 Units Subcutaneous Daily HLeonie Man MD   40 Units at 02/08/20 [phone] . insulin glargine (LANTUS)  injection 60 Units  60 Units Subcutaneous QHS HLeonie Man MD   60 Units at 02/07/20 2157  . isosorbide mononitrate (IMDUR) 24 hr tablet 30 mg  30 mg Oral Daily HLeonie Man MD   30 mg at 02/08/20 0824  . loratadine (CLARITIN) tablet 10 mg  10 mg Oral Daily HLeonie Man MD   10 mg at 02/08/20 0824  .  melatonin tablet 10 mg  10 mg Oral QHS Leonie Man, MD   10 mg at 02/07/20 2125  . morphine 2 MG/ML injection 2 mg  2 mg Intravenous Q2H PRN Leonie Man, MD      . nitroGLYCERIN (NITROSTAT) SL tablet 0.4 mg  0.4 mg Sublingual Q5 min PRN Leonie Man, MD      . ondansetron [facility]) injection 4 mg  4 mg Intravenous Q6H PRN Leonie Man, MD      . pantoprazole (PROTONIX) EC tablet 40 mg  40 mg Oral Daily PRN Leonie Man, MD      . prasugrel (EFFIENT) tablet 10 mg  10 mg Oral Daily Leonie Man, MD   10 mg at 02/08/20 0825  . Semaglutide(0.25 or 0.5MG/DOS) SOPN 0.5 mg  0.5 mg Injection Q Thu Leonie Man, MD      . sodium chloride flush (NS) 0.9 % injection 3 mL  3 mL Intravenous Q12H Leonie Man, MD   3 mL at 02/08/20 0829  . sodium chloride flush (NS) 0.9 % injection 3 mL  3 mL Intravenous PRN Leonie Man, MD      . tamsulosin [facility]) capsule 0.4 mg  0.4 mg Oral QHS Leonie Man, MD        Medications Prior to Admission  Medication Sig Dispense Refill Last Dose  . aspirin 81 MG tablet Take 81 mg by mouth daily.     02/07/2020 at 0900  . atorvastatin (LIPITOR) 10 MG tablet Take 1 tablet (10 mg total) by mouth daily. 90 tablet 1 02/06/2020 at Unknown time  . carvedilol (COREG) 12.5 MG tablet Take 1 tablet (12.5 mg total) by mouth 2 (two) times daily with a meal. 180 tablet 1 02/07/2020 at 0900  . cetirizine (ZYRTEC) 10 MG tablet Take 10 mg by mouth daily.   02/06/2020 at Unknown time  . Cinnamon 500 MG TABS Take 1,000 mg by mouth 2 (two) times daily.    02/06/2020 at Unknown time  . CRANBERRY PO Take 4,200 mg by mouth 2 (two) times daily.     02/07/2020 at 0900  . ibuprofen (ADVIL,MOTRIN) 200 MG tablet Take 800 mg by mouth every 6 (six) hours as needed for headache or moderate pain.      Marland Kitchen insulin aspart (NOVOLOG) 100 UNIT/ML injection Inject 15-45 Units into the skin 3 (three) times daily before meals.    Past Week at Unknown time  . insulin glargine (LANTUS SOLOSTAR) 100 UNIT/ML Solostar Pen 44u qam and 75 u in evening (Patient taking differently: Inject 44-75 Units into the skin See admin instructions. Inject 44 units in the morning and 75 units in the evening) 30 pen 5 02/06/2020 at 2030  . isosorbide mononitrate (IMDUR) 30 MG 24 hr tablet Take 1 tablet (30 mg total) by mouth daily. 90 tablet 3 02/06/2020 at Unknown time  . lisinopril-hydrochlorothiazide (ZESTORETIC) 20-12.5 MG tablet Take 1 tablet by mouth daily. 180 tablet 1 02/06/2020 at Unknown time  . Melatonin 10 MG TABS Take 10 mg by mouth at bedtime. Memory Argue Sleep 3   02/06/2020 at Unknown time  . metFORMIN (GLUCOPHAGE) 500 MG tablet Take 2 tablets (1,000 mg total) by mouth 2 (two) times daily. 120 tablet 1 02/06/2020 at Unknown time  . nitroGLYCERIN (NITROSTAT) 0.4 MG SL tablet Place 1 tablet (0.4 mg total) under the tongue every 5 (five) minutes as needed for chest pain. 25 tablet 3 never  . OVER THE  COUNTER MEDICATION      . pantoprazole (PROTONIX) 40 MG tablet Take 1 tablet (40 mg total) by mouth daily as needed (for acid reflux). 90 tablet 1 Past Week at Unknown time  . prasugrel (EFFIENT) 10 MG TABS tablet TAKE 1 TABLET ONCE DAILY. (Patient taking differently: Take 10 mg by mouth daily. ) 90 tablet 1 02/07/2020 at 0900  . Semaglutide,0.25 or 0.5MG/DOS, (OZEMPIC, 0.25 OR 0.5 MG/DOSE,) 2 MG/1.5ML SOPN INJECT 0.375 MLS (0.5 MG TOTAL) INTO THE SKIN ONCE A WEEK. (Patient taking differently: Inject 0.5 mg as directed every Thursday. ) 1.5 mL 0 02/01/2020  . tamsulosin (FLOMAX) 0.4 MG CAPS capsule Take 1 capsule (0.4 mg total) by mouth at bedtime. 90 capsule 1 02/07/2020 at 0900    . ACCU-CHEK AVIVA PLUS test strip USE TO CHECK BLOOD SUGAR UP TO 4 TIMES DAILY. 100 strip 10   . Accu-Chek Softclix Lancets lancets USE TO CHECK BLOOD SUGAR UP TO 4 TIMES DAILY. 100 each 0   . blood glucose meter kit and supplies Dispense based on patient and insurance preference. Use up to four times daily as directed. (FOR ICD-10 E10.9, E11.9). 1 each 0   . Continuous Blood Gluc Receiver (FREESTYLE LIBRE 14 DAY READER) DEVI USE DAILY TO CHECK BLOOD SUGAR. 1 each 2   . Continuous Blood Gluc Sensor (FREESTYLE LIBRE 2 SENSOR) MISC 1 each by Does not apply route every 14 (fourteen) days. 2 each 5   . GLOBAL EASE INJECT PEN NEEDLES 31G X 8 MM MISC      . HYDROcodone-acetaminophen (NORCO/VICODIN) 5-325 MG tablet Take 1-2 tablets by mouth every 4 (four) hours as needed for moderate pain. 30 tablet 0 More than a month at Unknown time    Family History  Problem Relation Age of Onset  . Hyperlipidemia Mother   . Hypertension Mother   . Hyperlipidemia Father   . Hypertension Father   . Diabetes Father   . Dementia Father   . Diabetes Maternal Grandfather      Review of Systems:   Review of Systems  Constitutional: Positive for chills and fever.  Respiratory: Positive for shortness of breath.   Cardiovascular: Positive for chest pain. Negative for leg swelling.  Musculoskeletal: Positive for joint pain (recent knee surgery).  Endo/Heme/Allergies: Bruises/bleeds easily.   Pertinent items are noted in HPI.    Physical Exam: BP (!) 110/59 (BP Location: Left Arm)   Pulse 82   Temp 98.7 F (37.1 C) (Oral)   Resp 18   Wt 105.7 kg   SpO2 94%   BMI 34.41 kg/m    General appearance: alert, cooperative and no distress Resp: clear to auscultation bilaterally Cardio: regular rate and rhythm, S1, S2 normal, no murmur, click, rub or gallop GI: soft, non-tender; bowel sounds normal; no masses,  no organomegaly Extremities: extremities normal, atraumatic, no cyanosis or edema Neurologic:  Grossly normal  Diagnostic Studies & Laboratory data:  Echo pending     Recent Radiology Findings:   CARDIAC CATHETERIZATION  Result Date: 02/07/2020  Ostial LAD lesion is 95% stenosed. (Image does not allow drawing ostial LAD lesions)  Prox LAD to Mid LAD lesion is 20% stenosed. Mid LAD STENT is 20% stenosed  Dist LAD lesion is 60% stenosed.  Very small caliber 1st Mrg lesion is 85% stenosed. No change from 2012  LPAV-1 lesion is 30% stenosed just prior to giving off LPL 1.  LPAV-2 STENT is 100% stenosed -which leaves the L PDA occluded  1st LPL-1  lesion is 95% stenosed.  1st LPL-2 lesion is 80% stenosed.  Prox RCA lesion is 70% stenosed.  ----------------------------  The left ventricular ejection fraction is 35-45% by visual estimate.  There is moderate left ventricular systolic dysfunction.  LV end diastolic pressure is normal.  SUMMARY  Severe multivessel CAD:  Heavily calcified eccentric 95% ostial LAD with mild in-stent restenosis of proximal-mid LAD stent and downstream diffuse mild to moderate disease with a focal 60% lesion;  small caliber ramus intermedius mild diffuse disease, heavily diseased OM1 (no change from 2012).   Very patulous dilated dominant LCx:  100% CTO of previously placed Stent leading from the AV groove circumflex to the PDA (no collateral)  95% mid LPL stenosis.  Nondominant RCA with proximal 70%.  At least moderately reduced LVEF estimated roughly 45% with apical anterior hypokinesis.  Severely elevated LVEDP - 28 mm. RECOMMENDATIONS  Very difficult situation with significant disease.  Need to review with heart team approach discussed between CVTS surgeons and other [place] physicians.  Brief discussion with Dr. Servando Snare would indicate that only the distal LAD would be a target for LIMA.  Dr. Fletcher Anon feels that the ostial LAD could be treated with atherectomy and stenting as could the LPL lesion.  Perhaps this would be a better option, however we need to assess  EF.  Anticipate full CVTS consultation note tomorrow.  We will hold off on stopping Effient until the decision is made about CABG versus PCI  If CABG: Would not be until Wednesday 11/17  If PCI: Would wait until Friday 11/12 with Dr. Fletcher Anon -> depending on EF, may or may not need to consider Impella support  With unstable angina presentation-chest pain and dyspnea with minimal exertion, I do not feel comfortable discharging this gentleman till a full plan is in place.  We will admit him to telemetry on the Interventional Team Service to allow time for HEART TEAM DISCUSSION for best option going forward.  Check 2D echocardiogram  The patient has a relatively difficult diabetes regimen, will start with a Lantus (at the lower end of his range) with mealtime and sliding scale coverage.  Diabetes education team to assist since he has a [employer]' monitor.  Continue to titrate antianginals per IC Team (Team C) Glenetta Hew, MD    I have independently reviewed the above radiologic studies and discussed with the patient   Recent Lab Findings: Lab Results  Component Value Date   WBC 7.9 02/08/2020   HGB 12.3 (L) 02/08/2020   HCT 38.7 (L) 02/08/2020   PLT 241 02/08/2020   GLUCOSE 198 (H) 02/08/2020   CHOL 89 02/08/2020   TRIG 188 (H) 02/08/2020   HDL 16 (L) 02/08/2020   LDLCALC 35 02/08/2020   ALT 40 01/31/2020   AST 27 01/31/2020   NA 138 02/08/2020   K 3.8 02/08/2020   CL 100 02/08/2020   CREATININE 1.09 02/08/2020   BUN 22 02/08/2020   CO2 26 02/08/2020   TSH 0.592 09/30/2010   INR 0.96 09/30/2010   HGBA1C 7.6 (H) 02/07/2020   ECHO: ECHOCARDIOGRAM LIMITED REPORT       Patient Name:  [name] [name] Date of Exam: 02/08/2020  Medical Rec #: [id number]   Height:    69.0 in  Accession #:  [id number]   Weight:    233.0 lb  Date of Birth: 19-Apr-1955   BSA:     2.205 m  Patient Age:  37 years    BP:  110/59 mmHg  Patient Gender: M        HR:      82 bpm.  Exam Location: Inpatient   Procedure: Limited Echo   Indications:  CAD    History:    Patient has prior history of Echocardiogram examinations,  most         recent 05/09/2018. Previous Myocardial Infarction; Risk         Factors:Hypertension, Diabetes and Dyslipidemia.    Sonographer:  Jannett Celestine RDCS (AE)  Referring Phys: [place]     Sonographer Comments: Technically difficult study due to poor echo  windows. Image acquisition challenging due to patient body habitus.  IMPRESSIONS    1. Left ventricular ejection fraction, by estimation, is 60 to 65%. The  left ventricle has normal function. The left ventricle has no regional  wall motion abnormalities. Left ventricular diastolic parameters are  consistent with Grade I diastolic  dysfunction (impaired relaxation).  2. Right ventricular systolic function is normal. The right ventricular  size is normal. Tricuspid regurgitation signal is inadequate for assessing  PA pressure.  3. The mitral valve is normal in structure. No evidence of mitral valve  regurgitation. No evidence of mitral stenosis.  4. The aortic valve is tricuspid. Aortic valve regurgitation is not  visualized. Mild aortic valve sclerosis is present, with no evidence of  aortic valve stenosis.  5. Aortic dilatation noted. There is mild dilatation of the ascending  aorta, measuring 40 mm.   FINDINGS  Left Ventricle: Left ventricular ejection fraction, by estimation, is 60  to 65%. The left ventricle has normal function. The left ventricle has no  regional wall motion abnormalities. Definity contrast agent was given IV  to delineate the left ventricular  endocardial borders. Left ventricular diastolic parameters are consistent  with Grade I diastolic dysfunction (impaired relaxation).   Right Ventricle: The right ventricular size is normal. No increase in  right ventricular wall  thickness. Right ventricular systolic function is  normal. Tricuspid regurgitation signal is inadequate for assessing PA  pressure.   Left Atrium: Left atrial size was normal in size.   Right Atrium: Right atrial size was normal in size.   Pericardium: Trivial pericardial effusion is present.   Mitral Valve: The mitral valve is normal in structure. No evidence of  mitral valve stenosis.   Tricuspid Valve: The tricuspid valve is normal in structure. Tricuspid  valve regurgitation is not demonstrated.   Aortic Valve: The aortic valve is tricuspid. Aortic valve regurgitation is  not visualized. Mild aortic valve sclerosis is present, with no evidence  of aortic valve stenosis.   Pulmonic Valve: The pulmonic valve was normal in structure. Pulmonic valve  regurgitation is not visualized.   Aorta: The aortic root is normal in size and structure and aortic  dilatation noted. There is mild dilatation of the ascending aorta,  measuring 40 mm.   Venous: The inferior vena cava was not well visualized.   IAS/Shunts: No atrial level shunt detected by color flow Doppler.   LEFT VENTRICLE  PLAX 2D  LVIDd:     5.00 cm  LVIDs:     2.90 cm  LV PW:     1.20 cm  LV IVS:    1.20 cm  LVOT diam:   2.30 cm  LVOT Area:   4.15 cm     LEFT ATRIUM     Index  LA diam:  3.80 cm 1.72 cm/m    AORTA  Ao Root diam:  3.30 cm     SHUNTS  Systemic Diam: 2.30 cm   Loralie Champagne MD  Electronically signed by Loralie Champagne MD  Signature Date/Time: 02/08/2020/2:33:04 PM    EF by echo appears greater then by LV gram  Chronic Kidney Disease   Stage I     GFR >90  Stage II    GFR 60-89  Stage IIIA GFR 45-59  Stage IIIB GFR 30-44  Stage IV   GFR 15-29  Stage V    GFR  <15  Lab Results  Component Value Date   CREATININE 1.09 02/08/2020   Estimated Creatinine Clearance: 79.9 mL/min (by C-G formula based on SCr of 1.09 mg/dL).     Assessment / Plan:      1.   Severe multivessel coronary artery disease-previous coronary stents. Echo pending. Continue current medication regimen. On IV heparin. Chest pain free 2.  Hyperlipidemia-continue statin 3.  Essential hypertension-BP well controlled, continue home medication  4.  Uncontrolled type 2 diabetes mellitis- A1C 7.6, Blood glucose not well controlled 5.  History of carotid stent x2, on Effient before admission  Plan: Discussed Coronary Artery Bypass Surgery with the patient in detail. He would favor a less invasive option.  Cardiac catheterization films were reviewed also reviewed with Dr. Ellyn Hack and Dr. Burt Knack.  The patient has a dominant circumflex system with several small obtuse marginal branches and distal circumflex is diffusely diseased to the ambulatory vessel and would be difficult to place a graft for long-term patency.  The right coronary artery is small and nondominant and not bypassed . the LAD with a proximal slightly more favorable for bypass surgery but is also diffusely diseased.  Angioplasty the proximal LAD is being considered cardiology service-after discussing bypass surgery the patient would prefer less invasive procedure than bypass surgery.  Cardiology plans intervention in the a.m. and has discussed this with the patient.

## 2020-02-08 NOTE — Progress Notes (Addendum)
Inpatient Diabetes Program Recommendations  AACE/ADA: New Consensus Statement on Inpatient Glycemic Control (2015)  Target Ranges:  Prepandial:   less than 140 mg/dL      Peak postprandial:   less than 180 mg/dL (1-2 hours)      Critically ill patients:  140 - 180 mg/dL   Lab Results  Component Value Date   GLUCAP 232 (H) 02/08/2020   HGBA1C 7.6 (H) 02/07/2020    Review of Glycemic Control  Diabetes history: type 2 Outpatient Diabetes medications: Lantus 44 units in am, 75 units at HS, Aspart sliding scale 5-45 units TID, Ozempic weekly (only on about 2 months), Metformin 1000 mg BID Current orders for Inpatient glycemic control: Lantus 40 units daily, 60 units at HS, Novolog 0-20 units TID, Novolog 6 units TID, Semaglutide 0.5 mg  Weekly.  Inpatient Diabetes Program Recommendations:   Spoke with patient about his diabetes. Was diagnosed about 20 years ago. PCP is Dr. Ronnald Collum at [facility] that his blood sugars range from 68 mg/dl to in the 200's post meals at home.  States that he takes up to about 30 units total Aspart during the day. Has tried several Freestyle Libre CGM's in the past, but the readings are way off the true blood sugars. Suggested that he try a Dexcom CGM. He is continuing with fingersticks.   Will continue to monitor blood sugars while in the hospital.   Harvel Ricks RN BSN CDE Diabetes Coordinator Pager: [phone]  8am-5pm

## 2020-02-08 NOTE — Progress Notes (Signed)
[place] for IV Heparin Indication: chest pain/ACS  No Known Allergies  Patient Measurements: Total Body Weight: 107.5 kg Height: 69 inches Heparin Dosing Weight: 94.1 kg  Vital Signs: Temp: 98.8 F (37.1 C) (11/11 2002) Temp Source: Oral (11/11 2002) BP: 114/58 (11/11 2002) Pulse Rate: 82 (11/11 2002)  Labs: Recent Labs    02/07/20 1828 02/08/20 0626 02/08/20 1359 02/08/20 2101  HGB 12.8* 12.3*  --   --   HCT 40.4 38.7*  --   --   PLT 228 241  --   --   HEPARINUNFRC  --  <0.10* 0.13* 0.24*  CREATININE  --  1.09  --   --     Estimated Creatinine Clearance: 79.9 mL/min (by C-G formula based on SCr of 1.09 mg/dL).   Assessment: 64 yr old man with hx CAD and DES in 2005, 2012 was admitted from home today, 02/07/20, for cardiac cath (F/U on abnormal stress test). Cath results: severe multi-vessel CAD; cardiac surgery has been consulted.  Heparin level is sub-therapeutic but trending up.  No issue with heparin infusion nor bleeding per RN.  Goal of Therapy:  Heparin level 0.3-0.7 units/ml Monitor platelets by anticoagulation protocol: Yes   Plan:  Increase heparin infusion to 1900 units/hr Check heparin level 6 hrs   Briza Bark D. Mina Marble, PharmD, BCPS, [place] 02/08/2020, 9:53 PM

## 2020-02-08 NOTE — Progress Notes (Addendum)
[place] for IV Heparin Indication: chest pain/ACS  No Known Allergies  Patient Measurements: Total Body Weight: 107.5 kg Height: 69 inches Heparin Dosing Weight: 94.1 kg  Vital Signs: Temp: 98.7 F (37.1 C) (11/11 0732) Temp Source: Oral (11/11 0732) BP: 110/59 (11/11 0732) Pulse Rate: 82 (11/11 0732)  Labs: Recent Labs    02/07/20 1828 02/08/20 0626  HGB 12.8* 12.3*  HCT 40.4 38.7*  PLT 228 241  HEPARINUNFRC  --  <0.10*  CREATININE  --  1.09    Estimated Creatinine Clearance: 79.9 mL/min (by C-G formula based on SCr of 1.09 mg/dL).   Medical History: Past Medical History:  Diagnosis Date  . Anxiety   . Coronary atherosclerosis of native coronary artery    DES distal circumflex 2005, DES LAD/diagonal bifurcation 7/12  . Essential hypertension   . GERD (gastroesophageal reflux disease)   . History of kidney stones   . Mixed hyperlipidemia   . Myocardial infarction ([place])    Anterolateral with VF arrest 7/12  . Type 2 diabetes mellitus [facility])     Assessment: 64 yr old man with hx CAD and DES in 2005, 2012 was admitted from home today, 02/07/20, for cardiac cath (F/U on abnormal stress test). Cath results: severe multi-vessel CAD; cardiac surgery has been consulted.  Per med rec, pt was on no anticoagulants PTA (on Prasugrel).  Heparin level this morning came back subtherapeutic at <0.1, on 1150 units/hr. Hgb 12.3, plt 241. No s/sx of bleeding or infusion issues.   Goal of Therapy:  Heparin level 0.3-0.7 units/ml Monitor platelets by anticoagulation protocol: Yes   Plan:  Increase heparin infusion to 1450 units/hr Check heparin level 6 hrs  Monitor daily heparin level, CBC Monitor for signs/symptoms of bleeding  Antonietta Jewel, PharmD, [place] Pharmacist  Phone: [phone] 02/08/2020 7:53 AM  Please check AMION for all [place] phone numbers After 10:00 PM, call [place]  [phone]   ADDENDUM Heparin level came back subtherapeutic at 0.13, on 1450 units/hr. No s/sx of bleeding or infusion issues. Increase heparin infusion to 1750 units/hr and get a level in 6 hours.   Antonietta Jewel, PharmD, [place] Clinical Pharmacist  Phone: [phone] 02/08/2020 3:09 PM  Please check AMION for all [place] phone numbers After 10:00 PM, call [place] [phone]

## 2020-02-08 NOTE — Progress Notes (Signed)
  Echocardiogram 2D Echocardiogram has been performed.  [name] [name] 02/08/2020, 10:45 AM

## 2020-02-09 ENCOUNTER — Inpatient Hospital Stay (HOSPITAL_COMMUNITY)
Admission: AD | Disposition: A | Discharge status: Home / Self Care | Payer: Self-pay | Source: Home / Self Care | Attending: Cardiology

## 2020-02-09 ENCOUNTER — Encounter (HOSPITAL_COMMUNITY): Payer: Self-pay | Admitting: Cardiovascular Disease

## 2020-02-09 DIAGNOSIS — E6609 Other obesity due to excess calories: Secondary | ICD-10-CM | POA: Diagnosis not present

## 2020-02-09 DIAGNOSIS — E118 Type 2 diabetes mellitus with unspecified complications: Secondary | ICD-10-CM | POA: Diagnosis not present

## 2020-02-09 DIAGNOSIS — I25110 Atherosclerotic heart disease of native coronary artery with unstable angina pectoris: Secondary | ICD-10-CM | POA: Diagnosis not present

## 2020-02-09 DIAGNOSIS — E785 Hyperlipidemia, unspecified: Secondary | ICD-10-CM | POA: Diagnosis not present

## 2020-02-09 HISTORY — PX: CORONARY ATHERECTOMY: CATH118238

## 2020-02-09 HISTORY — PX: CORONARY ULTRASOUND/IVUS: CATH118244

## 2020-02-09 HISTORY — PX: CORONARY STENT INTERVENTION: CATH118234

## 2020-02-09 LAB — CBC
HCT: 38.7 % — ABNORMAL LOW (ref 39.0–52.0)
Hemoglobin: 12.3 g/dL — ABNORMAL LOW (ref 13.0–17.0)
MCH: 28.1 pg (ref 26.0–34.0)
MCHC: 31.8 g/dL (ref 30.0–36.0)
MCV: 88.6 fL (ref 80.0–100.0)
Platelets: 238 10*3/uL (ref 150–400)
RBC: 4.37 MIL/uL (ref 4.22–5.81)
RDW: 13.8 % (ref 11.5–15.5)
WBC: 10.1 10*3/uL (ref 4.0–10.5)
nRBC: 0 % (ref 0.0–0.2)

## 2020-02-09 LAB — GLUCOSE, CAPILLARY
Glucose-Capillary: 175 mg/dL — ABNORMAL HIGH (ref 70–99)
Glucose-Capillary: 155 mg/dL — ABNORMAL HIGH (ref 70–99)
Glucose-Capillary: 140 mg/dL — ABNORMAL HIGH (ref 70–99)
Glucose-Capillary: 123 mg/dL — ABNORMAL HIGH (ref 70–99)
Glucose-Capillary: 190 mg/dL — ABNORMAL HIGH (ref 70–99)
Glucose-Capillary: 176 mg/dL — ABNORMAL HIGH (ref 70–99)

## 2020-02-09 LAB — BASIC METABOLIC PANEL
Anion gap: 11 (ref 5–15)
BUN: 25 mg/dL — ABNORMAL HIGH (ref 8–23)
CO2: 27 mmol/L (ref 22–32)
Calcium: 8.9 mg/dL (ref 8.9–10.3)
Chloride: 98 mmol/L (ref 98–111)
Creatinine, Ser: 1.08 mg/dL (ref 0.61–1.24)
GFR, Estimated: >60 mL/min (ref >60)
Glucose, Bld: 197 mg/dL — ABNORMAL HIGH (ref 70–99)
Potassium: 3.5 mmol/L (ref 3.5–5.1)
Sodium: 136 mmol/L (ref 135–145)

## 2020-02-09 LAB — POCT ACTIVATED CLOTTING TIME
Activated Clotting Time: 698 seconds
Activated Clotting Time: 516 seconds
Activated Clotting Time: 395 seconds

## 2020-02-09 LAB — HEPARIN LEVEL (UNFRACTIONATED): Heparin Unfractionated: 0.24 IU/mL — ABNORMAL LOW (ref 0.30–0.70)

## 2020-02-09 SURGERY — CORONARY ATHERECTOMY
Anesthesia: LOCAL

## 2020-02-09 MED ORDER — HEPARIN SODIUM (PORCINE) 1000 UNIT/ML IJ SOLN
INTRAMUSCULAR | Status: AC
  Filled 2020-02-09: qty 1

## 2020-02-09 MED ORDER — IOHEXOL 350 MG/ML SOLN
INTRAVENOUS | Status: DC | PRN
  Administered 2020-02-09: 190 mL

## 2020-02-09 MED ORDER — HEPARIN (PORCINE) IN NACL 1000-0.9 UT/500ML-% IV SOLN
INTRAVENOUS | Status: DC | PRN
  Administered 2020-02-09 (×2): 500 mL

## 2020-02-09 MED ORDER — SODIUM CHLORIDE 0.9% FLUSH: 3.0000 mL | INTRAVENOUS | Status: DC | PRN

## 2020-02-09 MED ORDER — MIDAZOLAM HCL 2 MG/2ML IJ SOLN
INTRAMUSCULAR | Status: AC
  Filled 2020-02-09: qty 2

## 2020-02-09 MED ORDER — HEPARIN SODIUM (PORCINE) 1000 UNIT/ML IJ SOLN
INTRAMUSCULAR | Status: DC | PRN
  Administered 2020-02-09: 12000 [IU] via INTRAVENOUS

## 2020-02-09 MED ORDER — VIPERSLIDE LUBRICANT OPTIME: TOPICAL | Status: DC | PRN

## 2020-02-09 MED ORDER — FENTANYL CITRATE (PF) 100 MCG/2ML IJ SOLN
INTRAMUSCULAR | Status: AC
  Filled 2020-02-09: qty 2

## 2020-02-09 MED ORDER — HEPARIN (PORCINE) IN NACL 1000-0.9 UT/500ML-% IV SOLN
INTRAVENOUS | Status: AC
  Filled 2020-02-09: qty 1000

## 2020-02-09 MED ORDER — IOHEXOL 350 MG/ML SOLN
INTRAVENOUS | Status: AC
  Filled 2020-02-09: qty 1

## 2020-02-09 MED ORDER — NITROGLYCERIN 1 MG/10 ML FOR IR/CATH LAB
INTRA_ARTERIAL | Status: AC
  Filled 2020-02-09: qty 10

## 2020-02-09 MED ORDER — SODIUM CHLORIDE 0.9 % IV SOLN: INTRAVENOUS | Status: AC

## 2020-02-09 MED ORDER — VERAPAMIL HCL 2.5 MG/ML IV SOLN
INTRAVENOUS | Status: DC | PRN
  Administered 2020-02-09: 10 mL via INTRA_ARTERIAL

## 2020-02-09 MED ORDER — SODIUM CHLORIDE 0.9 % IV SOLN: 250.0000 mL | INTRAVENOUS | Status: DC | PRN

## 2020-02-09 MED ORDER — MIDAZOLAM HCL 2 MG/2ML IJ SOLN
INTRAMUSCULAR | Status: DC | PRN
  Administered 2020-02-09: 1 mg via INTRAVENOUS

## 2020-02-09 MED ORDER — FENTANYL CITRATE (PF) 100 MCG/2ML IJ SOLN
INTRAMUSCULAR | Status: DC | PRN
  Administered 2020-02-09: 25 ug via INTRAVENOUS

## 2020-02-09 MED ORDER — SODIUM CHLORIDE 0.9% FLUSH
3.0000 mL | Freq: Two times a day (BID) | INTRAVENOUS | Status: DC
  Administered 2020-02-09 – 2020-02-10 (×2): 3 mL via INTRAVENOUS

## 2020-02-09 MED ORDER — LIDOCAINE HCL (PF) 1 % IJ SOLN
INTRAMUSCULAR | Status: DC | PRN
  Administered 2020-02-09: 2 mL

## 2020-02-09 MED ORDER — VERAPAMIL HCL 2.5 MG/ML IV SOLN
INTRAVENOUS | Status: AC
  Filled 2020-02-09: qty 2

## 2020-02-09 MED ORDER — LIDOCAINE HCL (PF) 1 % IJ SOLN
INTRAMUSCULAR | Status: AC
  Filled 2020-02-09: qty 30

## 2020-02-09 SURGICAL SUPPLY — 28 items
BALLN SAPPHIRE 2.0X12 (BALLOONS) ×2
BALLN SAPPHIRE ~~LOC~~ 2.5X12 (BALLOONS) ×1 IMPLANT
BALLN SAPPHIRE ~~LOC~~ 4.0X10 (BALLOONS) ×1 IMPLANT
BALLN WOLVERINE 3.00X10 (BALLOONS) ×2
BALLOON SAPPHIRE 2.0X12 (BALLOONS) IMPLANT
BALLOON WOLVERINE 3.00X10 (BALLOONS) IMPLANT
CATH OPTICROSS HD (CATHETERS) ×1 IMPLANT
CATH VISTA GUIDE 7FRXB LAD 3.5 (CATHETERS) ×1 IMPLANT
CROWN DIAMONDBACK CLASSIC 1.25 (BURR) ×1 IMPLANT
DEVICE RAD COMP TR BAND LRG (VASCULAR PRODUCTS) ×1 IMPLANT
ELECT DEFIB PAD ADLT CADENCE (PAD) ×1 IMPLANT
GLIDESHEATH SLEND SS 6F .021 (SHEATH) IMPLANT
GLIDESHEATH SLENDER 7FR .021G (SHEATH) ×1 IMPLANT
GUIDEWIRE INQWIRE 1.5J.035X260 (WIRE) IMPLANT
INQWIRE 1.5J .035X260CM (WIRE) ×2
KIT ENCORE 26 ADVANTAGE (KITS) ×1 IMPLANT
KIT HEART LEFT (KITS) ×2 IMPLANT
LUBRICANT VIPERSLIDE CORONARY (MISCELLANEOUS) ×1 IMPLANT
PACK CARDIAC CATHETERIZATION (CUSTOM PROCEDURE TRAY) ×2 IMPLANT
SLED PULL BACK IVUS (MISCELLANEOUS) ×1 IMPLANT
STENT SYNERGY XD 2.25X16 (Permanent Stent) IMPLANT
STENT SYNERGY XD 3.50X12 (Permanent Stent) IMPLANT
SYNERGY XD 2.25X16 (Permanent Stent) ×2 IMPLANT
SYNERGY XD 3.50X12 (Permanent Stent) ×2 IMPLANT
TRANSDUCER W/STOPCOCK (MISCELLANEOUS) ×2 IMPLANT
TUBING CIL FLEX 10 FLL-RA (TUBING) ×2 IMPLANT
WIRE RUNTHROUGH .014X180CM (WIRE) ×1 IMPLANT
WIRE VIPERWIRE COR FLEX .012 (WIRE) ×1 IMPLANT

## 2020-02-09 NOTE — Interval H&P Note (Signed)
History and Physical Interval Note:  11/12/2021Cath Lab Visit (complete for each Cath Lab visit)  Clinical Evaluation Leading to the Procedure:   ACS: No.  Non-ACS:    Anginal Classification: CCS III  Anti-ischemic medical therapy: Maximal Therapy (2 or more classes of medications)  Non-Invasive Test Results: No non-invasive testing performed  Prior CABG: No previous CABG       11:08 AM  [name] [name]  has presented today for surgery, with the diagnosis of Staged PCI.  The various methods of treatment have been discussed with the patient and family. After consideration of risks, benefits and other options for treatment, the patient has consented to  Procedure(s): CORONARY ATHERECTOMY (N/A) CORONARY STENT INTERVENTION (N/A) as a surgical intervention.  The patient's history has been reviewed, patient examined, no change in status, stable for surgery.  I have reviewed the patient's chart and labs.  Questions were answered to the patient's satisfaction.     Kathlyn [place]

## 2020-02-09 NOTE — Progress Notes (Addendum)
Progress Note  Patient Name: [name] [name] Date of Encounter: 02/09/2020  [facility] Cardiologist: Rozann Lesches, MD   Subjective   No chest pain or shortness of breath.   Inpatient Medications    Scheduled Meds: . aspirin EC  81 mg Oral Daily  . atorvastatin  10 mg Oral Daily  . carvedilol  12.5 mg Oral BID WC  . lisinopril  20 mg Oral Daily   And  . hydrochlorothiazide  12.5 mg Oral Daily  . insulin aspart  0-20 Units Subcutaneous TID WC  . insulin aspart  6 Units Subcutaneous TID WC  . insulin glargine  40 Units Subcutaneous Daily  . insulin glargine  60 Units Subcutaneous QHS  . isosorbide mononitrate  30 mg Oral Daily  . loratadine  10 mg Oral Daily  . melatonin  10 mg Oral QHS  . prasugrel  10 mg Oral Daily  . Semaglutide(0.25 or 0.5MG /DOS)  0.5 mg Injection Q Thu  . sodium chloride flush  3 mL Intravenous Q12H  . tamsulosin  0.4 mg Oral QHS   Continuous Infusions: . sodium chloride    . sodium chloride 10 mL/hr at 02/09/20 0530  . heparin 2,100 Units/hr (02/09/20 5784)   PRN Meds: sodium chloride, acetaminophen, HYDROcodone-acetaminophen, morphine injection, nitroGLYCERIN, ondansetron (ZOFRAN) IV, pantoprazole, sodium chloride flush   Vital Signs    Vitals:   02/08/20 0732 02/08/20 1749 02/08/20 2002 02/09/20 0525  BP: (!) 110/59 128/63 (!) 114/58 108/69  Pulse: 82 86 82 77  Resp: 18 18 20 18   Temp: 98.7 F (37.1 C) 99.8 F (37.7 C) 98.8 F (37.1 C) 98.6 F (37 C)  TempSrc: Oral Oral Oral Oral  SpO2: 94% 96% 92% 92%  Weight:    105.9 kg    Intake/Output Summary (Last 24 hours) at 02/09/2020 0946 Last data filed at 02/09/2020 0836 Gross per 24 hour  Intake 1134.05 ml  Output 1550 ml  Net -415.95 ml   Last 3 Weights 02/09/2020 02/08/2020 02/05/2020  Weight (lbs) 233 lb 6.4 oz 233 lb 237 lb  Weight (kg) 105.87 kg 105.688 kg 107.502 kg      Telemetry    NSR - Personally Reviewed  ECG    N/A  Physical Exam   GEN: No acute  distress.   Neck: No JVD Cardiac: RRR, no murmurs, rubs, or gallops.  Respiratory: Clear to auscultation bilaterally. GI: Soft, nontender, non-distended  MS: No edema; No deformity. Neuro:  Nonfocal  Psych: Normal affect   Labs    Chemistry Recent Labs  Lab 02/08/20 0626 02/09/20 0500  NA 138 136  K 3.8 3.5  CL 100 98  CO2 26 27  GLUCOSE 198* 197*  BUN 22 25*  CREATININE 1.09 1.08  CALCIUM 8.8* 8.9  GFRNONAA >60 >60  ANIONGAP 12 11     Hematology Recent Labs  Lab 02/07/20 1828 02/08/20 0626 02/09/20 0439  WBC 7.1 7.9 10.1  RBC 4.55 4.38 4.37  HGB 12.8* 12.3* 12.3*  HCT 40.4 38.7* 38.7*  MCV 88.8 88.4 88.6  MCH 28.1 28.1 28.1  MCHC 31.7 31.8 31.8  RDW 13.8 13.6 13.8  PLT 228 241 238    Radiology    CARDIAC CATHETERIZATION  Result Date: 02/07/2020  Ostial LAD lesion is 95% stenosed. (Image does not allow drawing ostial LAD lesions)  Prox LAD to Mid LAD lesion is 20% stenosed. Mid LAD STENT is 20% stenosed  Dist LAD lesion is 60% stenosed.  Very small caliber 1st Mrg  lesion is 85% stenosed. No change from 2012  LPAV-1 lesion is 30% stenosed just prior to giving off LPL 1.  LPAV-2 STENT is 100% stenosed -which leaves the L PDA occluded  1st LPL-1 lesion is 95% stenosed.  1st LPL-2 lesion is 80% stenosed.  Prox RCA lesion is 70% stenosed.  ----------------------------  The left ventricular ejection fraction is 35-45% by visual estimate.  There is moderate left ventricular systolic dysfunction.  LV end diastolic pressure is normal.  SUMMARY  Severe multivessel CAD:  Heavily calcified eccentric 95% ostial LAD with mild in-stent restenosis of proximal-mid LAD stent and downstream diffuse mild to moderate disease with a focal 60% lesion;  small caliber ramus intermedius mild diffuse disease, heavily diseased OM1 (no change from 2012).   Very patulous dilated dominant LCx:  100% CTO of previously placed Stent leading from the AV groove circumflex to the PDA  (no collateral)  95% mid LPL stenosis.  Nondominant RCA with proximal 70%.  At least moderately reduced LVEF estimated roughly 45% with apical anterior hypokinesis.  Severely elevated LVEDP - 28 mm. RECOMMENDATIONS  Very difficult situation with significant disease.  Need to review with heart team approach discussed between CVTS surgeons and other [place] physicians.  Brief discussion with Dr. Servando Snare would indicate that only the distal LAD would be a target for LIMA.  Dr. Fletcher Anon feels that the ostial LAD could be treated with atherectomy and stenting as could the LPL lesion.  Perhaps this would be a better option, however we need to assess EF.  Anticipate full CVTS consultation note tomorrow.  We will hold off on stopping Effient until the decision is made about CABG versus PCI  If CABG: Would not be until Wednesday 11/17  If PCI: Would wait until Friday 11/12 with Dr. Fletcher Anon -> depending on EF, may or may not need to consider Impella support  With unstable angina presentation-chest pain and dyspnea with minimal exertion, I do not feel comfortable discharging this gentleman till a full plan is in place.  We will admit him to telemetry on the Interventional Team Service to allow time for HEART TEAM DISCUSSION for best option going forward.  Check 2D echocardiogram  The patient has a relatively difficult diabetes regimen, will start with a Lantus (at the lower end of his range) with mealtime and sliding scale coverage.  Diabetes education team to assist since he has a [employer]' monitor.  Continue to titrate antianginals per IC Team (Team C) Glenetta Hew, MD  ECHOCARDIOGRAM LIMITED  Result Date: 02/08/2020    ECHOCARDIOGRAM LIMITED REPORT   Patient Name:   [name] [name] Date of Exam: 02/08/2020 Medical Rec #:  [id number]      Height:       69.0 in Accession #:    [id number]     Weight:       233.0 lb Date of Birth:  1955/07/22      BSA:          2.205 m Patient Age:    64 years       BP:            110/59 mmHg Patient Gender: M              HR:           82 bpm. Exam Location:  Inpatient Procedure: Limited Echo Indications:    CAD  History:        Patient has prior history of Echocardiogram examinations, most  recent 05/09/2018. Previous Myocardial Infarction; Risk                 Factors:Hypertension, Diabetes and Dyslipidemia.  Sonographer:    Jannett Celestine RDCS (AE) Referring Phys: [place]  Sonographer Comments: Technically difficult study due to poor echo windows. Image acquisition challenging due to patient body habitus. IMPRESSIONS  1. Left ventricular ejection fraction, by estimation, is 60 to 65%. The left ventricle has normal function. The left ventricle has no regional wall motion abnormalities. Left ventricular diastolic parameters are consistent with Grade I diastolic dysfunction (impaired relaxation).  2. Right ventricular systolic function is normal. The right ventricular size is normal. Tricuspid regurgitation signal is inadequate for assessing PA pressure.  3. The mitral valve is normal in structure. No evidence of mitral valve regurgitation. No evidence of mitral stenosis.  4. The aortic valve is tricuspid. Aortic valve regurgitation is not visualized. Mild aortic valve sclerosis is present, with no evidence of aortic valve stenosis.  5. Aortic dilatation noted. There is mild dilatation of the ascending aorta, measuring 40 mm. FINDINGS  Left Ventricle: Left ventricular ejection fraction, by estimation, is 60 to 65%. The left ventricle has normal function. The left ventricle has no regional wall motion abnormalities. Definity contrast agent was given IV to delineate the left ventricular  endocardial borders. Left ventricular diastolic parameters are consistent with Grade I diastolic dysfunction (impaired relaxation). Right Ventricle: The right ventricular size is normal. No increase in right ventricular wall thickness. Right ventricular systolic function is normal.  Tricuspid regurgitation signal is inadequate for assessing PA pressure. Left Atrium: Left atrial size was normal in size. Right Atrium: Right atrial size was normal in size. Pericardium: Trivial pericardial effusion is present. Mitral Valve: The mitral valve is normal in structure. No evidence of mitral valve stenosis. Tricuspid Valve: The tricuspid valve is normal in structure. Tricuspid valve regurgitation is not demonstrated. Aortic Valve: The aortic valve is tricuspid. Aortic valve regurgitation is not visualized. Mild aortic valve sclerosis is present, with no evidence of aortic valve stenosis. Pulmonic Valve: The pulmonic valve was normal in structure. Pulmonic valve regurgitation is not visualized. Aorta: The aortic root is normal in size and structure and aortic dilatation noted. There is mild dilatation of the ascending aorta, measuring 40 mm. Venous: The inferior vena cava was not well visualized. IAS/Shunts: No atrial level shunt detected by color flow Doppler. LEFT VENTRICLE PLAX 2D LVIDd:         5.00 cm LVIDs:         2.90 cm LV PW:         1.20 cm LV IVS:        1.20 cm LVOT diam:     2.30 cm LVOT Area:     4.15 cm  LEFT ATRIUM         Index LA diam:    3.80 cm 1.72 cm/m   AORTA Ao Root diam: 3.30 cm  SHUNTS Systemic Diam: 2.30 cm Loralie Champagne MD Electronically signed by Loralie Champagne MD Signature Date/Time: 02/08/2020/2:33:04 PM    Final     Cardiac Studies   Echo 02/08/2020 1. Left ventricular ejection fraction, by estimation, is 60 to 65%. The  left ventricle has normal function. The left ventricle has no regional  wall motion abnormalities. Left ventricular diastolic parameters are  consistent with Grade I diastolic  dysfunction (impaired relaxation).  2. Right ventricular systolic function is normal. The right ventricular  size is normal. Tricuspid regurgitation signal  is inadequate for assessing  PA pressure.  3. The mitral valve is normal in structure. No evidence of mitral  valve  regurgitation. No evidence of mitral stenosis.  4. The aortic valve is tricuspid. Aortic valve regurgitation is not  visualized. Mild aortic valve sclerosis is present, with no evidence of  aortic valve stenosis.  5. Aortic dilatation noted. There is mild dilatation of the ascending  aorta, measuring 40 mm.   LHC 02/07/20:   Ostial LAD lesion is 95% stenosed. (Image does not allow drawing ostial LAD lesions)  Prox LAD to Mid LAD lesion is 20% stenosed. Mid LAD STENT is 20% stenosed  Dist LAD lesion is 60% stenosed.  Very small caliber 1st Mrg lesion is 85% stenosed. No change from 2012  LPAV-1 lesion is 30% stenosed just prior to giving off LPL 1.  LPAV-2 STENT is 100% stenosed -which leaves the L PDA occluded  1st LPL-1 lesion is 95% stenosed.  1st LPL-2 lesion is 80% stenosed.  Prox RCA lesion is 70% stenosed.  ----------------------------  The left ventricular ejection fraction is 35-45% by visual estimate.  There is moderate left ventricular systolic dysfunction.  LV end diastolic pressure is normal.  SUMMARY  Severe multivessel CAD:  ? Heavily calcified eccentric 95% ostial LAD with mild in-stent restenosis of proximal-mid LAD stent and downstream diffuse mild to moderate disease with a focal 60% lesion;  ? small caliber ramus intermedius mild diffuse disease, heavily diseased OM1 (no change from 2012).  ? Very patulous dilated dominant LCx:  100% CTO of previously placed Stent leading from the AV groove circumflex to the PDA (no collateral)   95% mid LPL stenosis. Nondominant RCA with proximal 70%.  At least moderately reduced LVEF estimated roughly 45% with apical anterior hypokinesis.  Severely elevated LVEDP - 28 mm.   RECOMMENDATIONS  Very difficult situation with significant disease. Need to review with heart team approach discussed between CVTS surgeons and other [place] physicians. Brief discussion with Dr. Servando Snare would indicate that  only the distal LAD would be a target for LIMA. Dr. Fletcher Anon feels that the ostial LAD could be treated with atherectomy and stenting as could the LPL lesion. Perhaps this would be a better option, however we need to assess EF. ? Anticipate full CVTS consultation note tomorrow. We will hold off on stopping Effient until the decision is made about CABG versus PCI  If CABG: Would not be until Wednesday 11/17  If PCI: Would wait until Friday 11/12 with Dr. Fletcher Anon -> depending on EF, may or may not need to consider Impella support  With unstable angina presentation-chest pain and dyspnea with minimal exertion, I do not feel comfortable discharging this gentleman till a full plan is in place. We will admit him to telemetry on the Interventional Team Service to allow time for HEART TEAM DISCUSSION for best option going forward.  Check 2D echocardiogram  The patient has a relatively difficult diabetes regimen, will start with a Lantus (at the lower end of his range) with mealtime and sliding scale coverage. Diabetes education team to assist since he has a [employer]' monitor.  Continue to titrate antianginals per IC Team (Team C)   Diagnostic Dominance: Left   Patient Profile     64 y.o. male with a history of CAD ( DES Lcx 2005, DES LAD/diagonal 2012), HTN, HLD, GERD, DM2 presented for outpatient following high risk stress test based on decreased LVEF as well as two significant areas of ischemia.   LHC  with severe CAD Very difficult situation with significant disease. Need to review with heart team approach discussed between CVTS surgeons and other [place] physicians. Brief discussion with Dr. Servando Snare would indicate that only the distal LAD would be a target for LIMA. Dr. Fletcher Anon feels that the ostial LAD could be treated with atherectomy and stenting as could the LPL lesion.   Assessment & Plan    1. CAD - Cath result as above. Seen by Dr. Servando Snare and patient favored less invasive  approach. Films reviewed with internationalist. For coronary atherectomy later today with Dr. Fletcher Anon. - LVEF normal with grade 1 DD. No WM abnormality.  - Continue ASA, Effient, Imdur, coreg, Lipitor and ACE.   2. HTN - BP stable on current medications.   3. HLD - 02/08/2020: Cholesterol 89; HDL 16; LDL Cholesterol 35; Triglycerides 188; VLDL 38  - On Lipitor 10mg  qd >> consider increasing further  4. DM - Hgb 7.6 - needs strict control    For questions or updates, please contact [place] Please consult [email or web address] for contact info under        SignedLeanor Kail, PA  02/09/2020, 9:46 AM     Patient seen and examined. Agree with assessment and plan. Pt is back from complex PCI with IVUS guided orbital atherectomy /DES stent to severely calcified ostial LAD and PCI/DES to distal LCx first PL branch.  No chest pain.  He feels well.  Right radial site is stable.  Will increase atorvastatin to 40 mg.    Troy Sine, MD, [facility] 02/09/2020 4:02 PM

## 2020-02-09 NOTE — Progress Notes (Signed)
[place] [facility] for IV Heparin Indication: chest pain/ACS  No Known Allergies  Patient Measurements: Total Body Weight: 107.5 kg Height: 69 inches Heparin Dosing Weight: 94.1 kg  Vital Signs: Temp: 98.6 F (37 C) (11/12 0525) Temp Source: Oral (11/12 0525) BP: 108/69 (11/12 0525) Pulse Rate: 77 (11/12 0525)  Labs: Recent Labs    02/07/20 1828 02/07/20 1828 02/08/20 0626 02/08/20 0626 02/08/20 1359 02/08/20 2101 02/09/20 0439  HGB 12.8*   < > 12.3*  --   --   --  12.3*  HCT 40.4  --  38.7*  --   --   --  38.7*  PLT 228  --  241  --   --   --  238  HEPARINUNFRC  --   --  <0.10*   < > 0.13* 0.24* 0.24*  CREATININE  --   --  1.09  --   --   --   --    < > = values in this interval not displayed.    Estimated Creatinine Clearance: 80 mL/min (by C-G formula based on SCr of 1.09 mg/dL).   Assessment: 64 yr old man with hx CAD and DES in 2005, 2012 was admitted from home today, 02/07/20, for cardiac cath (F/U on abnormal stress test). Cath results: severe multi-vessel CAD; cardiac surgery has been consulted.  Heparin level remains subtherapeutic (0.24) on gtt at 1900 units/hr. No issues with line or bleeding reported per RN.  Goal of Therapy:  Heparin level 0.3-0.7 units/ml Monitor platelets by anticoagulation protocol: Yes   Plan:  Increase heparin infusion to 2100 units/hr Check heparin level 6 hrs   Sherlon Handing, PharmD, BCPS Please see amion for complete clinical pharmacist phone list 02/09/2020, 6:18 AM

## 2020-02-10 DIAGNOSIS — I10 Essential (primary) hypertension: Secondary | ICD-10-CM | POA: Diagnosis not present

## 2020-02-10 DIAGNOSIS — E785 Hyperlipidemia, unspecified: Secondary | ICD-10-CM | POA: Diagnosis not present

## 2020-02-10 DIAGNOSIS — E118 Type 2 diabetes mellitus with unspecified complications: Secondary | ICD-10-CM | POA: Diagnosis not present

## 2020-02-10 DIAGNOSIS — I25110 Atherosclerotic heart disease of native coronary artery with unstable angina pectoris: Secondary | ICD-10-CM | POA: Diagnosis not present

## 2020-02-10 LAB — BASIC METABOLIC PANEL
Anion gap: 8 (ref 5–15)
BUN: 20 mg/dL (ref 8–23)
CO2: 29 mmol/L (ref 22–32)
Calcium: 8.7 mg/dL — ABNORMAL LOW (ref 8.9–10.3)
Chloride: 97 mmol/L — ABNORMAL LOW (ref 98–111)
Creatinine, Ser: 1.22 mg/dL (ref 0.61–1.24)
GFR, Estimated: >60 mL/min (ref >60)
Glucose, Bld: 218 mg/dL — ABNORMAL HIGH (ref 70–99)
Potassium: 3.9 mmol/L (ref 3.5–5.1)
Sodium: 134 mmol/L — ABNORMAL LOW (ref 135–145)

## 2020-02-10 LAB — GLUCOSE, CAPILLARY
Glucose-Capillary: 203 mg/dL — ABNORMAL HIGH (ref 70–99)
Glucose-Capillary: 214 mg/dL — ABNORMAL HIGH (ref 70–99)
Glucose-Capillary: 226 mg/dL — ABNORMAL HIGH (ref 70–99)

## 2020-02-10 LAB — CBC
HCT: 38.6 % — ABNORMAL LOW (ref 39.0–52.0)
Hemoglobin: 12.5 g/dL — ABNORMAL LOW (ref 13.0–17.0)
MCH: 28.8 pg (ref 26.0–34.0)
MCHC: 32.4 g/dL (ref 30.0–36.0)
MCV: 88.9 fL (ref 80.0–100.0)
Platelets: 260 10*3/uL (ref 150–400)
RBC: 4.34 MIL/uL (ref 4.22–5.81)
RDW: 13.7 % (ref 11.5–15.5)
WBC: 8 10*3/uL (ref 4.0–10.5)
nRBC: 0 % (ref 0.0–0.2)

## 2020-02-10 MED ORDER — ATORVASTATIN CALCIUM 40 MG PO TABS: 40.0000 mg | ORAL_TABLET | Freq: Every day | ORAL | 11 refills | Status: DC

## 2020-02-10 MED ORDER — ATORVASTATIN CALCIUM 40 MG PO TABS: 40.0000 mg | ORAL_TABLET | Freq: Every day | ORAL | Status: DC

## 2020-02-10 MED ORDER — ACETAMINOPHEN 325 MG PO TABS: 650.0000 mg | ORAL_TABLET | ORAL | Status: DC | PRN

## 2020-02-10 NOTE — Plan of Care (Signed)
Problem: Education: Goal: Knowledge of General Education information will improve Description: Including pain rating scale, medication(s)/side effects and non-pharmacologic comfort measures 02/10/2020 1047 by Camillia Herter, RN Outcome: Adequate for Discharge 02/10/2020 0812 by Camillia Herter, RN Outcome: Progressing   Problem: Health Behavior/Discharge Planning: Goal: Ability to manage health-related needs will improve 02/10/2020 1047 by Camillia Herter, RN Outcome: Adequate for Discharge 02/10/2020 3419 by Camillia Herter, RN Outcome: Progressing   Problem: Clinical Measurements: Goal: Ability to maintain clinical measurements within normal limits will improve 02/10/2020 1047 by Camillia Herter, RN Outcome: Adequate for Discharge 02/10/2020 3790 by Camillia Herter, RN Outcome: Progressing Goal: Will remain free from infection 02/10/2020 1047 by Camillia Herter, RN Outcome: Adequate for Discharge 02/10/2020 0812 by Camillia Herter, RN Outcome: Progressing Goal: Diagnostic test results will improve 02/10/2020 1047 by Camillia Herter, RN Outcome: Adequate for Discharge 02/10/2020 2409 by Camillia Herter, RN Outcome: Progressing Goal: Respiratory complications will improve 02/10/2020 1047 by Camillia Herter, RN Outcome: Adequate for Discharge 02/10/2020 7353 by Camillia Herter, RN Outcome: Progressing Goal: Cardiovascular complication will be avoided 02/10/2020 1047 by Camillia Herter, RN Outcome: Adequate for Discharge 02/10/2020 2992 by Camillia Herter, RN Outcome: Progressing   Problem: Activity: Goal: Risk for activity intolerance will decrease 02/10/2020 1047 by Camillia Herter, RN Outcome: Adequate for Discharge 02/10/2020 [phone] by Camillia Herter, RN Outcome: Progressing   Problem: Nutrition: Goal: Adequate nutrition will be maintained 02/10/2020 1047 by Camillia Herter, RN Outcome: Adequate for Discharge 02/10/2020 0812 by Camillia Herter, RN Outcome: Progressing    Problem: Coping: Goal: Level of anxiety will decrease 02/10/2020 1047 by Camillia Herter, RN Outcome: Adequate for Discharge 02/10/2020 3419 by Camillia Herter, RN Outcome: Progressing   Problem: Elimination: Goal: Will not experience complications related to bowel motility 02/10/2020 1047 by Camillia Herter, RN Outcome: Adequate for Discharge 02/10/2020 6222 by Camillia Herter, RN Outcome: Progressing Goal: Will not experience complications related to urinary retention 02/10/2020 1047 by Camillia Herter, RN Outcome: Adequate for Discharge 02/10/2020 9798 by Camillia Herter, RN Outcome: Progressing   Problem: Pain Managment: Goal: General experience of comfort will improve 02/10/2020 1047 by Camillia Herter, RN Outcome: Adequate for Discharge 02/10/2020 9211 by Camillia Herter, RN Outcome: Progressing   Problem: Safety: Goal: Ability to remain free from injury will improve 02/10/2020 1047 by Camillia Herter, RN Outcome: Adequate for Discharge 02/10/2020 9417 by Camillia Herter, RN Outcome: Progressing   Problem: Skin Integrity: Goal: Risk for impaired skin integrity will decrease 02/10/2020 1047 by Camillia Herter, RN Outcome: Adequate for Discharge 02/10/2020 4081 by Camillia Herter, RN Outcome: Progressing   Problem: Education: Goal: Understanding of CV disease, CV risk reduction, and recovery process will improve 02/10/2020 1047 by Camillia Herter, RN Outcome: Adequate for Discharge 02/10/2020 0812 by Camillia Herter, RN Outcome: Progressing Goal: Individualized Educational Video(s) 02/10/2020 1047 by Camillia Herter, RN Outcome: Adequate for Discharge 02/10/2020 0812 by Camillia Herter, RN Outcome: Progressing   Problem: Activity: Goal: Ability to return to baseline activity level will improve 02/10/2020 1047 by Camillia Herter, RN Outcome: Adequate for Discharge 02/10/2020 4481 by Camillia Herter, RN Outcome: Progressing   Problem: Cardiovascular: Goal: Ability to  achieve and maintain adequate cardiovascular perfusion will improve 02/10/2020 1047 by Camillia Herter, RN Outcome: Adequate for Discharge 02/10/2020 0812 by Camillia Herter, RN Outcome: Progressing Goal: Vascular access  site(s) Level 0-1 will be maintained 02/10/2020 1047 by Camillia Herter, RN Outcome: Adequate for Discharge 02/10/2020 [phone] by Camillia Herter, RN Outcome: Progressing   Problem: Health Behavior/Discharge Planning: Goal: Ability to safely manage health-related needs after discharge will improve 02/10/2020 1047 by Camillia Herter, RN Outcome: Adequate for Discharge 02/10/2020 0812 by Camillia Herter, RN Outcome: Progressing

## 2020-02-10 NOTE — Progress Notes (Signed)
CARDIAC REHAB PHASE I   0930-1000 Patient observed ambulating independently in hallway with wife by side. Denied complaints. Post ambulation patient sitting in recliner. CR discharge education completed with patient and wife including exercise guidelines, angina/nitro use, antiplatelet, nutrition, and phase 2 CR. Patient states he has participated in CR in the past and request referral to [facility]. Patient encouraged to ambulate in hallway as tolerated per given guidelines until discharge.  Avaneesh Pepitone Minus Breeding RN, BSN

## 2020-02-10 NOTE — Discharge Summary (Signed)
Discharge Summary    Patient ID: [name] [name] MRN: [id number]; DOB: 1955-09-24  Admit date: 02/07/2020 Discharge date: 02/10/2020  Primary Care Provider: Chevis Pretty, FNP  Primary Cardiologist: [name] Lesches, MD  Primary Electrophysiologist:  None   Discharge Diagnoses    Principal Problem:   Coronary artery disease involving native coronary artery of native heart with unstable angina pectoris [facility]) Active Problems:   Hyperlipidemia with target LDL less than 70   Type 2 diabetes mellitus with complication, without long-term current use of insulin (HCC)   Class 1 obesity due to excess calories with serious comorbidity and body mass index (BMI) of 34.0 to 34.9 in adult   Allergies No Known Allergies  Diagnostic Studies/Procedures    CARDIAC CATH: 02/07/2020  Ostial LAD lesion is 95% stenosed. (Image does not allow drawing ostial LAD lesions)  Prox LAD to Mid LAD lesion is 20% stenosed. Mid LAD STENT is 20% stenosed  Dist LAD lesion is 60% stenosed.  Very small caliber 1st Mrg lesion is 85% stenosed. No change from 2012  LPAV-1 lesion is 30% stenosed just prior to giving off LPL 1.  LPAV-2 STENT is 100% stenosed -which leaves the L PDA occluded  1st LPL-1 lesion is 95% stenosed.  1st LPL-2 lesion is 80% stenosed.  Prox RCA lesion is 70% stenosed.  ----------------------------  The left ventricular ejection fraction is 35-45% by visual estimate.  There is moderate left ventricular systolic dysfunction.  LV end diastolic pressure is normal.   SUMMARY  Severe multivessel CAD:  ? Heavily calcified eccentric 95% ostial LAD with mild in-stent restenosis of proximal-mid LAD stent and downstream diffuse mild to moderate disease with a focal 60% lesion;  ? small caliber ramus intermedius mild diffuse disease, heavily diseased OM1 (no change from 2012).   ? Very patulous dilated dominant LCx:  100% CTO of previously placed Stent leading from the AV  groove circumflex to the PDA (no collateral)   95% mid LPL stenosis.  Nondominant RCA with proximal 70%.  At least moderately reduced LVEF estimated roughly 45% with apical anterior hypokinesis.  Severely elevated LVEDP - 28 mm.   RECOMMENDATIONS  Very difficult situation with significant disease.  Need to review with heart team approach discussed between CVTS surgeons and other [name] [name] physicians.  Brief discussion with [name] [name] would indicate that only the distal LAD would be a target for LIMA.  [name] [name] feels that the ostial LAD could be treated with atherectomy and stenting as could the LPL lesion.  Perhaps this would be a better option, however we need to assess EF. ? Anticipate full CVTS consultation note tomorrow.  We will hold off on stopping Effient until the decision is made about CABG versus PCI  If CABG: Would not be until Wednesday 11/17  If PCI: Would wait until Friday 11/12 with [name] [name] -> depending on EF, may or may not need to consider Impella support  With unstable angina presentation-chest pain and dyspnea with minimal exertion, I do not feel comfortable discharging this gentleman till a full plan is in place.  We will admit him to telemetry on the Interventional Team Service to allow time for HEART TEAM DISCUSSION for best option going forward.  Check 2D echocardiogram  The patient has a relatively difficult diabetes regimen, will start with a [name] [name] (at the lower end of his range) with mealtime and sliding scale coverage.  Diabetes education team to assist since he has a [name] [name]' monitor.  Continue to titrate antianginals  per IC Team (Team C) PCI: 02/09/2020  Prox RCA lesion is 70% stenosed.  Dist LM to Prox LAD lesion is 95% stenosed.  Dist LAD lesion is 60% stenosed.  Mid LAD lesion is 20% stenosed.  Prox LAD to Mid LAD lesion is 20% stenosed.  1st Mrg lesion is 85% stenosed.  1st LPL-1 lesion is 95% stenosed.  1st LPL-2 lesion is 80%  stenosed.  LPAV-1 lesion is 30% stenosed.  LPAV-2 lesion is 100% stenosed.  Post intervention, there is a 0% residual stenosis.  A drug-eluting stent was successfully placed using a SYNERGY XD 3.50X12.  Post intervention, there is a 0% residual stenosis.  A drug-eluting stent was successfully placed using a SYNERGY XD 2.25X16.   1. Successful IVUS guided orbital atherectomy and drug eluting stent placement to the ostial LAD. 2.  Successful angioplasty and drug-eluting stent placement to first PL branch of the left circumflex. Diagnostic Dominance: Left  Intervention    ECHO: 02/08/2020 1. Left ventricular ejection fraction, by estimation, is 60 to 65%. The  left ventricle has normal function. The left ventricle has no regional  wall motion abnormalities. Left ventricular diastolic parameters are  consistent with Grade I diastolic  dysfunction (impaired relaxation).  2. Right ventricular systolic function is normal. The right ventricular  size is normal. Tricuspid regurgitation signal is inadequate for assessing  PA pressure.  3. The mitral valve is normal in structure. No evidence of mitral valve  regurgitation. No evidence of mitral stenosis.  4. The aortic valve is tricuspid. Aortic valve regurgitation is not  visualized. Mild aortic valve sclerosis is present, with no evidence of  aortic valve stenosis.  5. Aortic dilatation noted. There is mild dilatation of the ascending  aorta, measuring 40 mm.   _____________   History of Present Illness     [name] [name] is a 64 y.o. male with a history of CAD ( DES Lcx 2005, DES LAD/diagonal 2012), HTN, HLD, GERD, DM2 presented for outpatient following high risk stress test based on decreased LVEF.   Hospital Course     Consultants: TCTS   [name] [name] has a history of coronary artery disease and needed cardiology clearance for knee surgery.  When he spoke with the office about cardiac clearance, he had been having  chest pain with shortness of breath.  [name] [name] had ordered a Lexiscan stress test.  The stress test was significantly abnormal so he was seen in the office on 02/05/2020 and scheduled for outpatient cardiac catheterization.  He came to the hospital for the procedure on 02/07/2020.  Cardiac catheterization results are above.  A surgical consult was called.  He was seen by [name] [name].  Cardiac catheterization was discussed with the patient.  The films were reviewed by [name] [name], Dr. Ellyn Hack, and Dr. Burt Knack.  The decision was made to pursue less invasive management, PCI preferentially over bypass surgery.  He was taken to the Cath Lab for PCI on 11/12.  Cardiac catheterization results are above.  He had atherectomy and drug-eluting stent to the LAD, tolerating the procedure well.  He was seen by cardiac rehab and ambulated without chest pain or shortness of breath. He prefers cardiac rehab at [facility], this will be arranged.  He was seen by Dr. Radford Pax and all data were reviewed.  His A1c is elevated and he is to follow-up with his PCP soon as possible to improve this.  Compliance with a diabetic diet is encouraged.  Vital signs were stable.  He  was ambulating without chest pain or shortness of breath.  No further inpatient work-up is indicated and he is considered stable for discharge, to follow-up as an outpatient.   Did the patient have an acute coronary syndrome (MI, NSTEMI, STEMI, etc) this admission?:  No                               Did the patient have a percutaneous coronary intervention (stent / angioplasty)?:  Yes.     Cath/PCI Registry Performance & Quality Measures: 4. Aspirin prescribed? - Yes 5. ADP Receptor Inhibitor (Plavix/Clopidogrel, Brilinta/Ticagrelor or Effient/Prasugrel) prescribed (includes medically managed patients)? - Yes 6. High Intensity Statin (Lipitor 40-92m or Crestor 20-436m prescribed? - Yes 7. For EF <40%, was ACEI/ARB prescribed? - Not Applicable (EF  >/= 4012%8. For EF <40%, Aldosterone Antagonist (Spironolactone or Eplerenone) prescribed? - Not Applicable (EF >/= 4087%9. Cardiac Rehab Phase II ordered? - Yes   _____________  Discharge Vitals Blood pressure 122/65, pulse 90, temperature 98.3 F (36.8 C), temperature source Axillary, resp. rate 18, weight 105.9 kg, SpO2 99 %.  Filed Weights   02/08/20 0030 02/09/20 0525 02/10/20 0502  Weight: 105.7 kg 105.9 kg 105.9 kg    Labs & Radiologic Studies    CBC Recent Labs    02/09/20 0439 02/10/20 0137  WBC 10.1 8.0  HGB 12.3* 12.5*  HCT 38.7* 38.6*  MCV 88.6 88.9  PLT 238 26867 Basic Metabolic Panel Recent Labs    02/09/20 0500 02/10/20 0137  NA 136 134*  K 3.5 3.9  CL 98 97*  CO2 27 29  GLUCOSE 197* 218*  BUN 25* 20  CREATININE 1.08 1.22  CALCIUM 8.9 8.7*   Liver Function Tests No results for input(s): AST, ALT, ALKPHOS, BILITOT, PROT, ALBUMIN in the last 72 hours. No results for input(s): LIPASE, AMYLASE in the last 72 hours. High Sensitivity Troponin:   No results for input(s): TROPONINIHS in the last 720 hours.  BNP Invalid input(s): POCBNP D-Dimer No results for input(s): DDIMER in the last 72 hours. Hemoglobin A1C Recent Labs    02/07/20 1840  HGBA1C 7.6*   Fasting Lipid Panel Recent Labs    02/08/20 0626  CHOL 89  HDL 16*  LDLCALC 35  TRIG 188*  CHOLHDL 5.6   Thyroid Function Tests No results for input(s): TSH, T4TOTAL, T3FREE, THYROIDAB in the last 72 hours.  Invalid input(s): FREET3 _____________  CARDIAC CATHETERIZATION  Result Date: 02/09/2020  Prox RCA lesion is 70% stenosed.  Dist LM to Prox LAD lesion is 95% stenosed.  Dist LAD lesion is 60% stenosed.  Mid LAD lesion is 20% stenosed.  Prox LAD to Mid LAD lesion is 20% stenosed.  1st Mrg lesion is 85% stenosed.  1st LPL-1 lesion is 95% stenosed.  1st LPL-2 lesion is 80% stenosed.  LPAV-1 lesion is 30% stenosed.  LPAV-2 lesion is 100% stenosed.  Post intervention, there is  a 0% residual stenosis.  A drug-eluting stent was successfully placed using a SYNERGY XD 3.50X12.  Post intervention, there is a 0% residual stenosis.  A drug-eluting stent was successfully placed using a SYNERGY XD 2.25X16.  1. Successful IVUS guided orbital atherectomy and drug eluting stent placement to the ostial LAD. 2.  Successful angioplasty and drug-eluting stent placement to first PL branch of the left circumflex. Recommendations: Continue indefinite dual antiplatelet therapy. Aggressive treatment of risk factors.   CARDIAC CATHETERIZATION  Result Date:  02/07/2020  Ostial LAD lesion is 95% stenosed. (Image does not allow drawing ostial LAD lesions)  Prox LAD to Mid LAD lesion is 20% stenosed. Mid LAD STENT is 20% stenosed  Dist LAD lesion is 60% stenosed.  Very small caliber 1st Mrg lesion is 85% stenosed. No change from 2012  LPAV-1 lesion is 30% stenosed just prior to giving off LPL 1.  LPAV-2 STENT is 100% stenosed -which leaves the L PDA occluded  1st LPL-1 lesion is 95% stenosed.  1st LPL-2 lesion is 80% stenosed.  Prox RCA lesion is 70% stenosed.  ----------------------------  The left ventricular ejection fraction is 35-45% by visual estimate.  There is moderate left ventricular systolic dysfunction.  LV end diastolic pressure is normal.  SUMMARY  Severe multivessel CAD:  Heavily calcified eccentric 95% ostial LAD with mild in-stent restenosis of proximal-mid LAD stent and downstream diffuse mild to moderate disease with a focal 60% lesion;  small caliber ramus intermedius mild diffuse disease, heavily diseased OM1 (no change from 2012).   Very patulous dilated dominant LCx:  100% CTO of previously placed Stent leading from the AV groove circumflex to the PDA (no collateral)  95% mid LPL stenosis.  Nondominant RCA with proximal 70%.  At least moderately reduced LVEF estimated roughly 45% with apical anterior hypokinesis.  Severely elevated LVEDP - 28 mm. RECOMMENDATIONS   Very difficult situation with significant disease.  Need to review with heart team approach discussed between CVTS surgeons and other [place] physicians.  Brief discussion with [name] [name] would indicate that only the distal LAD would be a target for LIMA.  [name] [name] feels that the ostial LAD could be treated with atherectomy and stenting as could the LPL lesion.  Perhaps this would be a better option, however we need to assess EF.  Anticipate full CVTS consultation note tomorrow.  We will hold off on stopping Effient until the decision is made about CABG versus PCI  If CABG: Would not be until Wednesday 11/17  If PCI: Would wait until Friday 11/12 with [name] [name] -> depending on EF, may or may not need to consider Impella support  With unstable angina presentation-chest pain and dyspnea with minimal exertion, I do not feel comfortable discharging this gentleman till a full plan is in place.  We will admit him to telemetry on the Interventional Team Service to allow time for HEART TEAM DISCUSSION for best option going forward.  Check 2D echocardiogram  The patient has a relatively difficult diabetes regimen, will start with a [name] [name] (at the lower end of his range) with mealtime and sliding scale coverage.  Diabetes education team to assist since he has a [name] [name]' monitor.  Continue to titrate antianginals per IC Team (Team C) Glenetta Hew, MD  NM Myocar Multi W/Spect W/Wall Motion / EF  Result Date: 02/02/2020  There was no ST segment deviation noted during stress.  Findings consistent with moderate mid to distal anterior/anteroapical ischemial. Large are of inferior/inferolateral ischemia. Both defects are nearly completely reversible.  This is a high risk study. Risk based on decreased LVEF as well as two significant areas of ischemia.  The left ventricular ejection fraction is moderately decreased (30-44%).    ECHOCARDIOGRAM LIMITED  Result Date: 02/08/2020    ECHOCARDIOGRAM LIMITED REPORT    Patient Name:   [name] [name] Date of Exam: 02/08/2020 Medical Rec #:  [id number]      Height:       69.0 in Accession #:  [id number]     Weight:       233.0 lb Date of Birth:  06/07/1955      BSA:          2.205 m Patient Age:    19 years       BP:           110/59 mmHg Patient Gender: M              HR:           82 bpm. Exam Location:  Inpatient Procedure: Limited Echo Indications:    CAD  History:        Patient has prior history of Echocardiogram examinations, most                 recent 05/09/2018. Previous Myocardial Infarction; Risk                 Factors:Hypertension, Diabetes and Dyslipidemia.  Sonographer:    Jannett Celestine RDCS (AE) Referring Phys: [place]  Sonographer Comments: Technically difficult study due to poor echo windows. Image acquisition challenging due to patient body habitus. IMPRESSIONS  1. Left ventricular ejection fraction, by estimation, is 60 to 65%. The left ventricle has normal function. The left ventricle has no regional wall motion abnormalities. Left ventricular diastolic parameters are consistent with Grade I diastolic dysfunction (impaired relaxation).  2. Right ventricular systolic function is normal. The right ventricular size is normal. Tricuspid regurgitation signal is inadequate for assessing PA pressure.  3. The mitral valve is normal in structure. No evidence of mitral valve regurgitation. No evidence of mitral stenosis.  4. The aortic valve is tricuspid. Aortic valve regurgitation is not visualized. Mild aortic valve sclerosis is present, with no evidence of aortic valve stenosis.  5. Aortic dilatation noted. There is mild dilatation of the ascending aorta, measuring 40 mm. FINDINGS  Left Ventricle: Left ventricular ejection fraction, by estimation, is 60 to 65%. The left ventricle has normal function. The left ventricle has no regional wall motion abnormalities. Definity contrast agent was given IV to delineate the left ventricular  endocardial borders.  Left ventricular diastolic parameters are consistent with Grade I diastolic dysfunction (impaired relaxation). Right Ventricle: The right ventricular size is normal. No increase in right ventricular wall thickness. Right ventricular systolic function is normal. Tricuspid regurgitation signal is inadequate for assessing PA pressure. Left Atrium: Left atrial size was normal in size. Right Atrium: Right atrial size was normal in size. Pericardium: Trivial pericardial effusion is present. Mitral Valve: The mitral valve is normal in structure. No evidence of mitral valve stenosis. Tricuspid Valve: The tricuspid valve is normal in structure. Tricuspid valve regurgitation is not demonstrated. Aortic Valve: The aortic valve is tricuspid. Aortic valve regurgitation is not visualized. Mild aortic valve sclerosis is present, with no evidence of aortic valve stenosis. Pulmonic Valve: The pulmonic valve was normal in structure. Pulmonic valve regurgitation is not visualized. Aorta: The aortic root is normal in size and structure and aortic dilatation noted. There is mild dilatation of the ascending aorta, measuring 40 mm. Venous: The inferior vena cava was not well visualized. IAS/Shunts: No atrial level shunt detected by color flow Doppler. LEFT VENTRICLE PLAX 2D LVIDd:         5.00 cm LVIDs:         2.90 cm LV PW:         1.20 cm LV IVS:        1.20 cm LVOT diam:  2.30 cm LVOT Area:     4.15 cm  LEFT ATRIUM         Index LA diam:    3.80 cm 1.72 cm/m   AORTA Ao Root diam: 3.30 cm  SHUNTS Systemic Diam: 2.30 cm Loralie Champagne MD Electronically signed by Loralie Champagne MD Signature Date/Time: 02/08/2020/2:33:04 PM    Final    Disposition   Pt is being discharged home today in improved condition.  Follow-up Plans & Appointments     Follow-up Information    [name] Pretty, FNP. Schedule an appointment as soon as possible for a visit.   Specialty: Family Medicine Why: You need to be seen as soon as possible  for diabetes management. Contact information: [place] [address] [phone]        Satira Sark, MD Follow up.   Specialty: Cardiology Why: Keep scheduled follow-up appointment Contact information: [address] [place] [address] [phone]              Discharge Instructions    AMB Referral to Cardiac Rehabilitation - Phase II   Complete by: As directed    Diagnosis: Coronary Stents   After initial evaluation and assessments completed: Virtual Based Care may be provided alone or in conjunction with Phase 2 Cardiac Rehab based on patient barriers.: Yes   Amb Referral to Cardiac Rehabilitation   Complete by: As directed    Diagnosis: Coronary Stents   After initial evaluation and assessments completed: Virtual Based Care may be provided alone or in conjunction with Phase 2 Cardiac Rehab based on patient barriers.: Yes   Call MD for:  redness, tenderness, or signs of infection (pain, swelling, redness, odor or green/yellow discharge around incision site)   Complete by: As directed    Diet - low sodium heart healthy   Complete by: As directed    Diet Carb Modified   Complete by: As directed    Increase activity slowly   Complete by: As directed       Discharge Medications   Allergies as of 02/10/2020   No Known Allergies     Medication List    STOP taking these medications   ibuprofen 200 MG tablet Commonly known as: ADVIL     TAKE these medications   Accu-Chek Aviva Plus test strip Generic drug: glucose blood USE TO CHECK BLOOD SUGAR UP TO 4 TIMES DAILY.   Accu-Chek Softclix Lancets lancets USE TO CHECK BLOOD SUGAR UP TO 4 TIMES DAILY.   acetaminophen 325 MG tablet Commonly known as: TYLENOL Take 2 tablets (650 mg total) by mouth every 4 (four) hours as needed for headache or mild pain.   aspirin 81 MG tablet Take 81 mg by mouth daily.   atorvastatin 40 MG tablet Commonly known as: LIPITOR Take 1 tablet (40  mg total) by mouth daily. What changed:   medication strength  how much to take   blood glucose meter kit and supplies Dispense based on patient and insurance preference. Use up to four times daily as directed. (FOR ICD-10 E10.9, E11.9).   carvedilol 12.5 MG tablet Commonly known as: COREG Take 1 tablet (12.5 mg total) by mouth 2 (two) times daily with a meal.   cetirizine 10 MG tablet Commonly known as: ZYRTEC Take 10 mg by mouth daily.   Cinnamon 500 MG Tabs Take 1,000 mg by mouth 2 (two) times daily.   CRANBERRY PO Take 4,200 mg by mouth 2 (two) times daily.  FreeStyle Libre 14 Day Reader Kerrin Mo USE DAILY TO CHECK BLOOD SUGAR.   FreeStyle Libre 2 Sensor Misc 1 each by Does not apply route every 14 (fourteen) days.   Global Ease Inject Pen Needles 31G X 8 MM Misc Generic drug: Insulin Pen Needle   HYDROcodone-acetaminophen 5-325 MG tablet Commonly known as: NORCO/VICODIN Take 1-2 tablets by mouth every 4 (four) hours as needed for moderate pain.   insulin aspart 100 UNIT/ML injection Commonly known as: novoLOG Inject 15-45 Units into the skin 3 (three) times daily before meals.   isosorbide mononitrate 30 MG 24 hr tablet Commonly known as: IMDUR Take 1 tablet (30 mg total) by mouth daily.   [name] [name] SoloStar 100 UNIT/ML Solostar Pen Generic drug: insulin glargine 44u qam and 75 u in evening What changed:   how much to take  how to take this  when to take this  additional instructions   lisinopril-hydrochlorothiazide 20-12.5 MG tablet Commonly known as: Zestoretic Take 1 tablet by mouth daily.   Melatonin 10 MG Tabs Take 10 mg by mouth at bedtime. [employer] [place] Sleep 3   metFORMIN 500 MG tablet Commonly known as: GLUCOPHAGE Take 2 tablets (1,000 mg total) by mouth 2 (two) times daily. Notes to patient: HOLD for 48 hours, restart on Monday, 02/12/2020   nitroGLYCERIN 0.4 MG SL tablet Commonly known as: NITROSTAT Place 1 tablet (0.4 mg total) under  the tongue every 5 (five) minutes as needed for chest pain.   OVER THE COUNTER MEDICATION   Ozempic (0.25 or 0.5 MG/DOSE) 2 MG/1.5ML Sopn Generic drug: Semaglutide(0.25 or 0.5MG/DOS) INJECT 0.375 MLS (0.5 MG TOTAL) INTO THE SKIN ONCE A WEEK. What changed:   how much to take  how to take this  when to take this  additional instructions   pantoprazole 40 MG tablet Commonly known as: PROTONIX Take 1 tablet (40 mg total) by mouth daily as needed (for acid reflux).   prasugrel 10 MG Tabs tablet Commonly known as: EFFIENT TAKE 1 TABLET ONCE DAILY. What changed:   how much to take  how to take this  when to take this  additional instructions   tamsulosin 0.4 MG Caps capsule Commonly known as: FLOMAX Take 1 capsule (0.4 mg total) by mouth at bedtime.          Outstanding Labs/Studies   None  Duration of Discharge Encounter   Greater than 30 minutes including physician time.  Signed, Rosaria Ferries, PA-C 02/10/2020, 11:01 AM

## 2020-02-10 NOTE — Plan of Care (Signed)

## 2020-02-10 NOTE — Progress Notes (Addendum)
Progress Note  Patient Name: [name] [name] Date of Encounter: 02/10/2020  [facility] Cardiologist: Rozann Lesches, MD   Subjective   Denies any chest pain or SOB.    Inpatient Medications    Scheduled Meds: . aspirin EC  81 mg Oral Daily  . atorvastatin  10 mg Oral Daily  . carvedilol  12.5 mg Oral BID WC  . lisinopril  20 mg Oral Daily   And  . hydrochlorothiazide  12.5 mg Oral Daily  . insulin aspart  0-20 Units Subcutaneous TID WC  . insulin aspart  6 Units Subcutaneous TID WC  . insulin glargine  40 Units Subcutaneous Daily  . insulin glargine  60 Units Subcutaneous QHS  . isosorbide mononitrate  30 mg Oral Daily  . loratadine  10 mg Oral Daily  . melatonin  10 mg Oral QHS  . prasugrel  10 mg Oral Daily  . Semaglutide(0.25 or 0.5MG /DOS)  0.5 mg Injection Q Thu  . sodium chloride flush  3 mL Intravenous Q12H  . tamsulosin  0.4 mg Oral QHS   Continuous Infusions: . sodium chloride    . sodium chloride     PRN Meds: sodium chloride, sodium chloride, acetaminophen, HYDROcodone-acetaminophen, morphine injection, nitroGLYCERIN, ondansetron (ZOFRAN) IV, pantoprazole, sodium chloride flush   Vital Signs    Vitals:   02/09/20 2322 02/10/20 0502 02/10/20 0723 02/10/20 0750  BP: 127/82 133/72 134/70 122/65  Pulse: 93 82 84 90  Resp: 18 18  18   Temp: 98 F (36.7 C) 98.7 F (37.1 C)  98.3 F (36.8 C)  TempSrc: Oral Oral  Axillary  SpO2: 94% 99%  99%  Weight:  105.9 kg      Intake/Output Summary (Last 24 hours) at 02/10/2020 1001 Last data filed at 02/10/2020 0751 Gross per 24 hour  Intake 977.75 ml  Output 1700 ml  Net -722.25 ml   Last 3 Weights 02/10/2020 02/09/2020 02/08/2020  Weight (lbs) 233 lb 6.4 oz 233 lb 6.4 oz 233 lb  Weight (kg) 105.87 kg 105.87 kg 105.688 kg      Telemetry    NSR - Personally Reviewed  ECG    N/A  Physical Exam   GEN: Well nourished, well developed in no acute distress HEENT: Normal NECK: No JVD; No carotid  bruits LYMPHATICS: No lymphadenopathy CARDIAC:RRR, no murmurs, rubs, gallops RESPIRATORY:  Clear to auscultation without rales, wheezing or rhonchi  ABDOMEN: Soft, non-tender, non-distended MUSCULOSKELETAL:  No edema; No deformity  SKIN: Warm and dry NEUROLOGIC:  Alert and oriented x 3 PSYCHIATRIC:  Normal affect    Labs    Chemistry Recent Labs  Lab 02/08/20 0626 02/09/20 0500 02/10/20 0137  NA 138 136 134*  K 3.8 3.5 3.9  CL 100 98 97*  CO2 26 27 29   GLUCOSE 198* 197* 218*  BUN 22 25* 20  CREATININE 1.09 1.08 1.22  CALCIUM 8.8* 8.9 8.7*  GFRNONAA >60 >60 >60  ANIONGAP 12 11 8      Hematology Recent Labs  Lab 02/08/20 0626 02/09/20 0439 02/10/20 0137  WBC 7.9 10.1 8.0  RBC 4.38 4.37 4.34  HGB 12.3* 12.3* 12.5*  HCT 38.7* 38.7* 38.6*  MCV 88.4 88.6 88.9  MCH 28.1 28.1 28.8  MCHC 31.8 31.8 32.4  RDW 13.6 13.8 13.7  PLT 241 238 260    Radiology    CARDIAC CATHETERIZATION  Result Date: 02/09/2020  Prox RCA lesion is 70% stenosed.  Dist LM to Prox LAD lesion is 95% stenosed.  Dist  LAD lesion is 60% stenosed.  Mid LAD lesion is 20% stenosed.  Prox LAD to Mid LAD lesion is 20% stenosed.  1st Mrg lesion is 85% stenosed.  1st LPL-1 lesion is 95% stenosed.  1st LPL-2 lesion is 80% stenosed.  LPAV-1 lesion is 30% stenosed.  LPAV-2 lesion is 100% stenosed.  Post intervention, there is a 0% residual stenosis.  A drug-eluting stent was successfully placed using a SYNERGY XD 3.50X12.  Post intervention, there is a 0% residual stenosis.  A drug-eluting stent was successfully placed using a SYNERGY XD 2.25X16.  1. Successful IVUS guided orbital atherectomy and drug eluting stent placement to the ostial LAD. 2.  Successful angioplasty and drug-eluting stent placement to first PL branch of the left circumflex. Recommendations: Continue indefinite dual antiplatelet therapy. Aggressive treatment of risk factors.   ECHOCARDIOGRAM LIMITED  Result Date: 02/08/2020     ECHOCARDIOGRAM LIMITED REPORT   Patient Name:   [name] [name] Date of Exam: 02/08/2020 Medical Rec #:  [id number]      Height:       69.0 in Accession #:    [id number]     Weight:       233.0 lb Date of Birth:  [birth year]      BSA:          2.205 m Patient Age:    64 years       BP:           110/59 mmHg Patient Gender: M              HR:           82 bpm. Exam Location:  Inpatient Procedure: Limited Echo Indications:    CAD  History:        Patient has prior history of Echocardiogram examinations, most                 recent 05/09/2018. Previous Myocardial Infarction; Risk                 Factors:Hypertension, Diabetes and Dyslipidemia.  Sonographer:    Jannett Celestine RDCS (AE) Referring Phys: [place]  Sonographer Comments: Technically difficult study due to poor echo windows. Image acquisition challenging due to patient body habitus. IMPRESSIONS  1. Left ventricular ejection fraction, by estimation, is 60 to 65%. The left ventricle has normal function. The left ventricle has no regional wall motion abnormalities. Left ventricular diastolic parameters are consistent with Grade I diastolic dysfunction (impaired relaxation).  2. Right ventricular systolic function is normal. The right ventricular size is normal. Tricuspid regurgitation signal is inadequate for assessing PA pressure.  3. The mitral valve is normal in structure. No evidence of mitral valve regurgitation. No evidence of mitral stenosis.  4. The aortic valve is tricuspid. Aortic valve regurgitation is not visualized. Mild aortic valve sclerosis is present, with no evidence of aortic valve stenosis.  5. Aortic dilatation noted. There is mild dilatation of the ascending aorta, measuring 40 mm. FINDINGS  Left Ventricle: Left ventricular ejection fraction, by estimation, is 60 to 65%. The left ventricle has normal function. The left ventricle has no regional wall motion abnormalities. Definity contrast agent was given IV to delineate the left  ventricular  endocardial borders. Left ventricular diastolic parameters are consistent with Grade I diastolic dysfunction (impaired relaxation). Right Ventricle: The right ventricular size is normal. No increase in right ventricular wall thickness. Right ventricular systolic function is normal. Tricuspid regurgitation signal is inadequate for assessing PA  pressure. Left Atrium: Left atrial size was normal in size. Right Atrium: Right atrial size was normal in size. Pericardium: Trivial pericardial effusion is present. Mitral Valve: The mitral valve is normal in structure. No evidence of mitral valve stenosis. Tricuspid Valve: The tricuspid valve is normal in structure. Tricuspid valve regurgitation is not demonstrated. Aortic Valve: The aortic valve is tricuspid. Aortic valve regurgitation is not visualized. Mild aortic valve sclerosis is present, with no evidence of aortic valve stenosis. Pulmonic Valve: The pulmonic valve was normal in structure. Pulmonic valve regurgitation is not visualized. Aorta: The aortic root is normal in size and structure and aortic dilatation noted. There is mild dilatation of the ascending aorta, measuring 40 mm. Venous: The inferior vena cava was not well visualized. IAS/Shunts: No atrial level shunt detected by color flow Doppler. LEFT VENTRICLE PLAX 2D LVIDd:         5.00 cm LVIDs:         2.90 cm LV PW:         1.20 cm LV IVS:        1.20 cm LVOT diam:     2.30 cm LVOT Area:     4.15 cm  LEFT ATRIUM         Index LA diam:    3.80 cm 1.72 cm/m   AORTA Ao Root diam: 3.30 cm  SHUNTS Systemic Diam: 2.30 cm Loralie Champagne MD Electronically signed by Loralie Champagne MD Signature Date/Time: 02/08/2020/2:33:04 PM    Final     Cardiac Studies   Echo 02/08/2020 1. Left ventricular ejection fraction, by estimation, is 60 to 65%. The  left ventricle has normal function. The left ventricle has no regional  wall motion abnormalities. Left ventricular diastolic parameters are  consistent  with Grade I diastolic  dysfunction (impaired relaxation).  2. Right ventricular systolic function is normal. The right ventricular  size is normal. Tricuspid regurgitation signal is inadequate for assessing  PA pressure.  3. The mitral valve is normal in structure. No evidence of mitral valve  regurgitation. No evidence of mitral stenosis.  4. The aortic valve is tricuspid. Aortic valve regurgitation is not  visualized. Mild aortic valve sclerosis is present, with no evidence of  aortic valve stenosis.  5. Aortic dilatation noted. There is mild dilatation of the ascending  aorta, measuring 40 mm.   LHC 02/07/20:   Ostial LAD lesion is 95% stenosed. (Image does not allow drawing ostial LAD lesions)  Prox LAD to Mid LAD lesion is 20% stenosed. Mid LAD STENT is 20% stenosed  Dist LAD lesion is 60% stenosed.  Very small caliber 1st Mrg lesion is 85% stenosed. No change from 2012  LPAV-1 lesion is 30% stenosed just prior to giving off LPL 1.  LPAV-2 STENT is 100% stenosed -which leaves the L PDA occluded  1st LPL-1 lesion is 95% stenosed.  1st LPL-2 lesion is 80% stenosed.  Prox RCA lesion is 70% stenosed.  ----------------------------  The left ventricular ejection fraction is 35-45% by visual estimate.  There is moderate left ventricular systolic dysfunction.  LV end diastolic pressure is normal.  SUMMARY  Severe multivessel CAD:  ? Heavily calcified eccentric 95% ostial LAD with mild in-stent restenosis of proximal-mid LAD stent and downstream diffuse mild to moderate disease with a focal 60% lesion;  ? small caliber ramus intermedius mild diffuse disease, heavily diseased OM1 (no change from 2012).  ? Very patulous dilated dominant LCx:  100% CTO of previously placed Stent leading from the  AV groove circumflex to the PDA (no collateral)   95% mid LPL stenosis. Nondominant RCA with proximal 70%.  At least moderately reduced LVEF estimated roughly 45% with  apical anterior hypokinesis.  Severely elevated LVEDP - 28 mm.   RECOMMENDATIONS  Very difficult situation with significant disease. Need to review with heart team approach discussed between CVTS surgeons and other [place] physicians. Brief discussion with Dr. Servando Snare would indicate that only the distal LAD would be a target for LIMA. Dr. Fletcher Anon feels that the ostial LAD could be treated with atherectomy and stenting as could the LPL lesion. Perhaps this would be a better option, however we need to assess EF. ? Anticipate full CVTS consultation note tomorrow. We will hold off on stopping Effient until the decision is made about CABG versus PCI  If CABG: Would not be until Wednesday 11/17  If PCI: Would wait until Friday 11/12 with Dr. Fletcher Anon -> depending on EF, may or may not need to consider Impella support  With unstable angina presentation-chest pain and dyspnea with minimal exertion, I do not feel comfortable discharging this gentleman till a full plan is in place. We will admit him to telemetry on the Interventional Team Service to allow time for HEART TEAM DISCUSSION for best option going forward.  Check 2D echocardiogram  The patient has a relatively difficult diabetes regimen, will start with a Lantus (at the lower end of his range) with mealtime and sliding scale coverage. Diabetes education team to assist since he has a [employer]' monitor.  Continue to titrate antianginals per IC Team (Team C)   Diagnostic Dominance: Left   Patient Profile     64 y.o. male with a history of CAD ( DES Lcx 2005, DES LAD/diagonal 2012), HTN, HLD, GERD, DM2 presented for outpatient following high risk stress test based on decreased LVEF as well as two significant areas of ischemia.  Assessment & Plan    1. CAD - Cath result as above.  - s/p complex PCI yesterday with IVUS guided orbital atherectomy /DES stent to severely calcified ostial LAD and PCI/DES to distal LCx first PL  branch. - LVEF normal with grade 1 DD. No WM abnormality.  - Continue ASA, Effient, Imdur, coreg, Lipitor and ACE.   2. HTN - BP stable at 122/80mmHg this am - continue carvedilol 12.5mg  BID, Lisinopril 20mg  daily and HCTZ 12.5mg  daily - SCr stable at 1.22 post cath  3. HLD - 02/08/2020: Cholesterol 89; HDL 16; LDL Cholesterol 35; Triglycerides 188; VLDL 38  - increase Lipitor to high dose 40mg  daily - check FLP and ALT in 6 weeks  4. DM - Hgb 7.6 - needs strict control  - metformin on hold for 48 hr post cath - continue Insulin and Ozempic - will ask TRH to see for recommendations for his DM prior to discharge home  Patient is stable for discharge home today.  Will need FLP and ALT in 6 weeks.  Followup with PCP for Rx of DM.  Restart Metformin 11/15.  TOC followup in 7-10 days in our office.    For questions or updates, please contact [facility] Please consult [email or web address] for contact info under        Signed, Fransico Him, MD  02/10/2020, 10:01 AM

## 2020-02-12 ENCOUNTER — Telehealth: Payer: Self-pay | Admitting: *Deleted

## 2020-02-12 MED FILL — Nitroglycerin IV Soln 100 MCG/ML in D5W: INTRA_ARTERIAL | Qty: 10 | Status: AC

## 2020-02-12 NOTE — Telephone Encounter (Signed)
Contact Date: 02/12/2020 Contacted By: Baldomero Lamy, LPN  Transition Care Management Follow-up Telephone Call  Date of discharge and from where: 02/10/20 [facility]  How have you been since you were released from the hospital? "Tired, otherwise good."  Any questions or concerns? No   Items Reviewed:  Did the pt receive and understand the discharge instructions provided? Yes   Medications obtained and verified? Yes   Any new allergies since your discharge? No   Dietary orders reviewed? Yes  Do you have support at home? Yes   Discontinued Medications Atorvastatin 40m New Medications Added Atorvastatin 48m Current Medication List Allergies as of 02/12/2020   No Known Allergies     Medication List       Accurate as of February 12, 2020  8:39 AM. If you have any questions, ask your nurse or doctor.        Accu-Chek Aviva Plus test strip Generic drug: glucose blood USE TO CHECK BLOOD SUGAR UP TO 4 TIMES DAILY.   Accu-Chek Softclix Lancets lancets USE TO CHECK BLOOD SUGAR UP TO 4 TIMES DAILY.   acetaminophen 325 MG tablet Commonly known as: TYLENOL Take 2 tablets (650 mg total) by mouth every 4 (four) hours as needed for headache or mild pain.   aspirin 81 MG tablet Take 81 mg by mouth daily.   atorvastatin 40 MG tablet Commonly known as: LIPITOR Take 1 tablet (40 mg total) by mouth daily.   blood glucose meter kit and supplies Dispense based on patient and insurance preference. Use up to four times daily as directed. (FOR ICD-10 E10.9, E11.9).   carvedilol 12.5 MG tablet Commonly known as: COREG Take 1 tablet (12.5 mg total) by mouth 2 (two) times daily with a meal.   cetirizine 10 MG tablet Commonly known as: ZYRTEC Take 10 mg by mouth daily.   Cinnamon 500 MG Tabs Take 1,000 mg by mouth 2 (two) times daily.   CRANBERRY PO Take 4,200 mg by mouth 2 (two) times daily.   FreeStyle Libre 14 Day Reader DeKerrin MoSE DAILY TO CHECK BLOOD  SUGAR.   FreeStyle Libre 2 Sensor Misc 1 each by Does not apply route every 14 (fourteen) days.   Global Ease Inject Pen Needles 31G X 8 MM Misc Generic drug: Insulin Pen Needle   HYDROcodone-acetaminophen 5-325 MG tablet Commonly known as: NORCO/VICODIN Take 1-2 tablets by mouth every 4 (four) hours as needed for moderate pain.   insulin aspart 100 UNIT/ML injection Commonly known as: novoLOG Inject 15-45 Units into the skin 3 (three) times daily before meals.   isosorbide mononitrate 30 MG 24 hr tablet Commonly known as: IMDUR Take 1 tablet (30 mg total) by mouth daily.   Lantus SoloStar 100 UNIT/ML Solostar Pen Generic drug: insulin glargine 44u qam and 75 u in evening What changed:   how much to take  how to take this  when to take this  additional instructions   lisinopril-hydrochlorothiazide 20-12.5 MG tablet Commonly known as: Zestoretic Take 1 tablet by mouth daily.   Melatonin 10 MG Tabs Take 10 mg by mouth at bedtime. [employer] [place] Sleep 3   metFORMIN 500 MG tablet Commonly known as: GLUCOPHAGE Take 2 tablets (1,000 mg total) by mouth 2 (two) times daily.   nitroGLYCERIN 0.4 MG SL tablet Commonly known as: NITROSTAT Place 1 tablet (0.4 mg total) under the tongue every 5 (five) minutes as needed for chest pain.   OVER THE COUNTER MEDICATION   Ozempic (0.25 or  0.5 MG/DOSE) 2 MG/1.5ML Sopn Generic drug: Semaglutide(0.25 or 0.5MG/DOS) INJECT 0.375 MLS (0.5 MG TOTAL) INTO THE SKIN ONCE A WEEK. What changed:   how much to take  how to take this  when to take this  additional instructions   pantoprazole 40 MG tablet Commonly known as: PROTONIX Take 1 tablet (40 mg total) by mouth daily as needed (for acid reflux).   prasugrel 10 MG Tabs tablet Commonly known as: EFFIENT TAKE 1 TABLET ONCE DAILY. What changed:   how much to take  how to take this  when to take this  additional instructions   tamsulosin 0.4 MG Caps capsule Commonly  known as: FLOMAX Take 1 capsule (0.4 mg total) by mouth at bedtime.        Home Care and Equipment/Supplies: Were home health services ordered? no If so, what is the name of the agency?   Has the agency set up a time to come to the patient's home? not applicable Were any new equipment or medical supplies ordered?  No What is the name of the medical supply agency?  Were you able to get the supplies/equipment? not applicable Do you have any questions related to the use of the equipment or supplies? No  Functional Questionnaire: (I = Independent and D = Dependent) ADLs: I  Bathing/Dressing- I  Meal Prep- I  Eating- I  Maintaining continence- I  Transferring/Ambulation- I  Managing Meds- I  Follow up appointments reviewed:   PCP Hospital f/u appt confirmed? Yes  Scheduled to see Chevis Pretty, NP on 02/13/2020 @ 2:15pm.  [facility] f/u appt confirmed? Yes  Scheduled to see Dr Domenic Polite on 03/07/2020 @ 8:05am.  Are transportation arrangements needed? No   If their condition worsens, is the pt aware to call PCP or go to the Emergency Dept.? Yes  Was the patient provided with contact information for the PCP's office or ED? Yes  Was to pt encouraged to call back with questions or concerns? Yes

## 2020-02-13 ENCOUNTER — Other Ambulatory Visit: Payer: Self-pay

## 2020-02-13 ENCOUNTER — Encounter: Payer: Self-pay | Admitting: Nurse Practitioner

## 2020-02-13 ENCOUNTER — Ambulatory Visit: Payer: Medicare PPO | Admitting: Nurse Practitioner

## 2020-02-13 VITALS — BP 118/66 | HR 82 | Temp 98.1°F | Resp 20 | Ht 69.0 in | Wt 234.0 lb

## 2020-02-13 DIAGNOSIS — I10 Essential (primary) hypertension: Secondary | ICD-10-CM | POA: Diagnosis not present

## 2020-02-13 DIAGNOSIS — E1159 Type 2 diabetes mellitus with other circulatory complications: Secondary | ICD-10-CM

## 2020-02-13 DIAGNOSIS — I25110 Atherosclerotic heart disease of native coronary artery with unstable angina pectoris: Secondary | ICD-10-CM

## 2020-02-13 DIAGNOSIS — Z794 Long term (current) use of insulin: Secondary | ICD-10-CM

## 2020-02-13 MED ORDER — OZEMPIC (0.25 OR 0.5 MG/DOSE) 2 MG/1.5ML ~~LOC~~ SOPN: PEN_INJECTOR | SUBCUTANEOUS | 0 refills | Status: DC

## 2020-02-13 MED ORDER — INSULIN ASPART 100 UNIT/ML ~~LOC~~ SOLN: 15.0000 [IU] | Freq: Three times a day (TID) | SUBCUTANEOUS | 5 refills | Status: DC

## 2020-02-13 MED ORDER — METFORMIN HCL 500 MG PO TABS: 1000.0000 mg | ORAL_TABLET | Freq: Two times a day (BID) | ORAL | 1 refills | Status: DC

## 2020-02-13 MED ORDER — LISINOPRIL-HYDROCHLOROTHIAZIDE 20-12.5 MG PO TABS: 1.0000 | ORAL_TABLET | Freq: Every day | ORAL | 1 refills | Status: DC

## 2020-02-13 NOTE — Patient Instructions (Signed)

## 2020-02-13 NOTE — Progress Notes (Addendum)
Subjective:    Patient ID: [name] [name], male    DOB: 08-03-1955, 64 y.o.   MRN: [id number]   Chief Complaint: transition of care   HPI Today's visit was for Transitional Care Management.  The patient was discharged from [facility] on 02/10/20 with a primary diagnosis of coronary artery disease.   Contact with the patient and/or caregiver, by a clinical staff member, was made on 02/12/20 and was documented as a telephone encounter within the EMR.  Through chart review and discussion with the patient I have determined that management of their condition is of high complexity.    Pt here today for hospital follow-up after heart cath procedure. Was having SOB and chest pain at last appt and called cardiologist who wanted  Did heart cath that showed old stent that was clogged and new area of blockage. They did a second heart cath on Friday where they cleaned out the old stent and put a new stent in and put a stent on the new area of blockage in the back of the heart. Pt has been tired since procedure but otherwise good. Does report a little bit of brief lightheadedness with standing or bending. Has another follow-up with cardiologist Dr. Domenic Polite on December 9th.    Review of Systems  Constitutional: Negative for diaphoresis.  Eyes: Negative for pain.  Respiratory: Negative for shortness of breath.   Cardiovascular: Negative for chest pain, palpitations and leg swelling.  Gastrointestinal: Negative for abdominal pain.  Endocrine: Negative for polydipsia.  Skin: Negative for rash.  Neurological: Negative for dizziness, weakness and headaches.  Hematological: Does not bruise/bleed easily.  All other systems reviewed and are negative.      Objective:   Physical Exam Vitals and nursing note reviewed.  Constitutional:      Appearance: Normal appearance.  Cardiovascular:     Rate and Rhythm: Normal rate and regular rhythm.     Heart sounds: Normal heart sounds.  Pulmonary:      Effort: Pulmonary effort is normal.     Breath sounds: Normal breath sounds.  Skin:    General: Skin is warm.  Neurological:     General: No focal deficit present.     Mental Status: He is alert and oriented to person, place, and time.  Psychiatric:        Mood and Affect: Mood normal.        Behavior: Behavior normal.    BP 118/66   Pulse 82   Temp 98.1 F (36.7 C) (Temporal)   Resp 20   Ht 5\' 9"  (1.753 m)   Wt 234 lb (106.1 kg)   SpO2 92%   BMI 34.56 kg/m         Assessment & Plan:  [name] [name] in today with chief complaint of Hospitalization Follow-up   1. Coronary artery diease Keep follow up with cardiology no strenuous activity until follow up with cardiology Hospital records reviewed  2. Essential hypertension, benign Low sodium diet - lisinopril-hydrochlorothiazide (ZESTORETIC) 20-12.5 MG tablet; Take 1 tablet by mouth daily.  Dispense: 180 tablet; Refill: 1  2. Type 2 diabetes mellitus with other circulatory complication, with long-term current use of insulin (HCC) Strict carb counitng - metFORMIN (GLUCOPHAGE) 500 MG tablet; Take 2 tablets (1,000 mg total) by mouth 2 (two) times daily.  Dispense: 120 tablet; Refill: 1 - Semaglutide,0.25 or 0.5MG /DOS, (OZEMPIC, 0.25 OR 0.5 MG/DOSE,) 2 MG/1.5ML SOPN; INJECT 0.375 MLS (0.5 MG TOTAL) INTO THE SKIN ONCE A WEEK.  Dispense: 1.5 mL; Refill: 0 - insulin aspart (NOVOLOG) 100 UNIT/ML injection; Inject 15-45 Units into the skin 3 (three) times daily before meals.  Dispense: 10 mL; Refill: 5    The above assessment and management plan was discussed with the patient. The patient verbalized understanding of and has agreed to the management plan. Patient is aware to call the clinic if symptoms persist or worsen. Patient is aware when to return to the clinic for a follow-up visit. Patient educated on when it is appropriate to go to the emergency department.   [name], FNP

## 2020-02-14 ENCOUNTER — Telehealth: Payer: Self-pay

## 2020-02-15 ENCOUNTER — Other Ambulatory Visit: Payer: Self-pay | Admitting: Nurse Practitioner

## 2020-02-15 MED ORDER — INSULIN ASPART 100 UNIT/ML FLEXPEN: PEN_INJECTOR | SUBCUTANEOUS | 11 refills | Status: DC

## 2020-02-15 MED ORDER — DOXYCYCLINE HYCLATE 100 MG PO TABS: 100.0000 mg | ORAL_TABLET | Freq: Two times a day (BID) | ORAL | 0 refills | Status: DC

## 2020-02-16 ENCOUNTER — Other Ambulatory Visit: Payer: Self-pay | Admitting: Nurse Practitioner

## 2020-02-16 MED ORDER — GLOBAL EASE INJECT PEN NEEDLES 31G X 8 MM MISC
3 refills | Status: DC
Start: 2020-02-16 — End: 2020-02-16

## 2020-02-16 MED ORDER — GLOBAL EASE INJECT PEN NEEDLES 31G X 8 MM MISC
3 refills | Status: DC
Start: 2020-02-16 — End: 2021-05-27

## 2020-02-16 NOTE — Telephone Encounter (Signed)
Pt came in today to get refill on pen needles to [facility] and Insulin should go to [facility] This has been corrected, sent to pharmacy's

## 2020-03-06 ENCOUNTER — Encounter: Payer: Self-pay | Admitting: Cardiology

## 2020-03-06 NOTE — Progress Notes (Signed)
Cardiology Office Note  Date: 03/07/2020   ID: [name], [name] 1955-12-30, MRN [id number]  PCP:  Chevis Pretty, FNP  Cardiologist:  Rozann Lesches, MD Electrophysiologist:  None   Chief Complaint  Patient presents with  . Cardiac follow-up    History of Present Illness: [name] [name] is a 64 y.o. male last seen in November by Mr. [name] Sake NP and referred for cardiac catheterization following high risk Myoview. He was found to have multivessel CAD and was ultimately treated with orbital atherectomy and DES to the ostial LAD as well as DES to the first PL branch of the circumflex, residual anatomy noted below.  LVEF was 60 to 65% by echocardiography.  He presents for a follow-up visit.  Overall, doing reasonably well reporting significant improvement in prior angina symptoms, although still not back to baseline stamina.  We talked about a walking plan, he is also interested in cardiac rehabilitation.  I reviewed his medications which are listed below.  He reports compliance.  We are going to attempt to wean off Imdur to see if this is still required, may not be necessary following his recent intervention but he does have over the other residual ischemic substrate.  Does report a feeling of orthostatic lightheadedness at times.  I asked him to keep an eye on his blood pressure in case we need to down titrate perhaps his Zestoretic.  Recent lipids are outlined below, his LDL is in the 30-50 range, he has a low HDL however.  Past Medical History:  Diagnosis Date  . Anxiety   . Coronary atherosclerosis of native coronary artery    DES distal circumflex 2005; DES LAD/diagonal bifurcation 09/2010; DES ostial LAD and DES left PL 01/2020  . Essential hypertension   . GERD (gastroesophageal reflux disease)   . History of kidney stones   . Mixed hyperlipidemia   . Myocardial infarction ([place])    Anterolateral with VF arrest 7/12  . Type 2 diabetes mellitus ([place])     Past  Surgical History:  Procedure Laterality Date  . CARDIAC CATHETERIZATION  2012  . CAROTID STENT     stents x 2   . CHOLECYSTECTOMY N/A 12/04/2015   Procedure: LAPAROSCOPIC CHOLECYSTECTOMY;  Surgeon: Aviva Signs, MD;  Location: [facility];  Service: General;  Laterality: N/A;  . CHOLECYSTECTOMY, LAPAROSCOPIC  12/05/2015  . CORONARY ANGIOPLASTY  2012   STENT X 1 2012, STENT X 1 YRS BEFORE  . CORONARY ATHERECTOMY N/A 02/09/2020   Procedure: CORONARY ATHERECTOMY;  Surgeon: Wellington Hampshire, MD;  Location: [facility];  Service: Cardiovascular;  Laterality: N/A;  . CORONARY STENT INTERVENTION N/A 02/09/2020   Procedure: CORONARY STENT INTERVENTION;  Surgeon: Wellington Hampshire, MD;  Location: [facility];  Service: Cardiovascular;  Laterality: N/A;  . HYDROCELE EXCISION Bilateral 06/02/2017   Procedure: HYDROCELECTOMY ADULT;  Surgeon: Ceasar Mons, MD;  Location: [facility];  Service: Urology;  Laterality: Bilateral;  ONLY NEEDS 45 MIN  . INGUINAL HERNIA REPAIR     RIGHT GROIN  . INTRAVASCULAR ULTRASOUND/IVUS N/A 02/09/2020   Procedure: Intravascular Ultrasound/IVUS;  Surgeon: Wellington Hampshire, MD;  Location: [facility];  Service: Cardiovascular;  Laterality: N/A;  . KNEE ARTHROSCOPY Left 05/23/2019   Procedure: LEFT KNEE ARTHROSCOPY AND DEBRIDEMENT PARTIAL MEDIAL MENISECTOMY;  Surgeon: Newt Minion, MD;  Location: [facility];  Service: Orthopedics;  Laterality: Left;  . LEFT HEART CATH AND CORONARY ANGIOGRAPHY N/A 02/07/2020   Procedure:  LEFT HEART CATH AND CORONARY ANGIOGRAPHY;  Surgeon: Harding, Kevontae W, MD;  Location: [facility];  Service: Cardiovascular;  Laterality: N/A;  . TRIGGER FINGER RELEASE Right 01/08/2015   Procedure: RELEASE TRIGGER FINGER/A-1 PULLEY RIGHT MIDDLE FINGER;  Surgeon: Gary Kuzma, MD;  Location: [facility];  Service: Orthopedics;  Laterality: Right;    Current Outpatient Medications  Medication Sig  Dispense Refill  . aspirin 81 MG tablet Take 81 mg by mouth daily.    . atorvastatin (LIPITOR) 40 MG tablet Take 1 tablet (40 mg total) by mouth daily. 30 tablet 11  . carvedilol (COREG) 12.5 MG tablet Take 1 tablet (12.5 mg total) by mouth 2 (two) times daily with a meal. 180 tablet 1  . cetirizine (ZYRTEC) 10 MG tablet Take 10 mg by mouth daily.    . Cinnamon 500 MG TABS Take 1,000 mg by mouth 2 (two) times daily.     . CRANBERRY PO Take 4,200 mg by mouth 2 (two) times daily.     . Insulin Aspart FlexPen 100 UNIT/ML SOPN INJECT UP TO 45 UNITS EVERY DAY 15 mL 11  . insulin glargine (LANTUS SOLOSTAR) 100 UNIT/ML Solostar Pen 44u qam and 75 u in evening (Patient taking differently: Inject 44-75 Units into the skin See admin instructions. Inject 44 units in the morning and 75 units in the evening) 30 pen 5  . isosorbide mononitrate (IMDUR) 30 MG 24 hr tablet Take 1 tablet (30 mg total) by mouth daily. 90 tablet 3  . lisinopril-hydrochlorothiazide (ZESTORETIC) 20-12.5 MG tablet Take 1 tablet by mouth daily. 180 tablet 1  . Melatonin 10 MG TABS Take 10 mg by mouth at bedtime. [place] Sleep 3    . metFORMIN (GLUCOPHAGE) 500 MG tablet Take 2 tablets (1,000 mg total) by mouth 2 (two) times daily. 120 tablet 1  . nitroGLYCERIN (NITROSTAT) 0.4 MG SL tablet Place 1 tablet (0.4 mg total) under the tongue every 5 (five) minutes as needed for chest pain. 25 tablet 3  . pantoprazole (PROTONIX) 40 MG tablet Take 1 tablet (40 mg total) by mouth daily as needed (for acid reflux). 90 tablet 1  . prasugrel (EFFIENT) 10 MG TABS tablet TAKE 1 TABLET ONCE DAILY. (Patient taking differently: Take 10 mg by mouth daily.) 90 tablet 1  . Semaglutide,0.25 or 0.5MG/DOS, (OZEMPIC, 0.25 OR 0.5 MG/DOSE,) 2 MG/1.5ML SOPN INJECT 0.375 MLS (0.5 MG TOTAL) INTO THE SKIN ONCE A WEEK. 1.5 mL 0  . tamsulosin (FLOMAX) 0.4 MG CAPS capsule Take 1 capsule (0.4 mg total) by mouth at bedtime. 90 capsule 1  . ACCU-CHEK AVIVA PLUS test  strip USE TO CHECK BLOOD SUGAR UP TO 4 TIMES DAILY. 100 strip 10  . Accu-Chek Softclix Lancets lancets USE TO CHECK BLOOD SUGAR UP TO 4 TIMES DAILY. 100 each 0  . blood glucose meter kit and supplies Dispense based on patient and insurance preference. Use up to four times daily as directed. (FOR ICD-10 E10.9, E11.9). 1 each 0  . GLOBAL EASE INJECT PEN NEEDLES 31G X 8 MM MISC Use up to 8 times a day Dx E11.8 800 each 3   No current facility-administered medications for this visit.   Allergies:  Patient has no known allergies.   ROS: No palpitations or syncope.  Physical Exam: VS:  BP 136/76   Pulse 78   Ht 5' 8.5" (1.74 m)   Wt 234 lb 6.4 oz (106.3 kg)   SpO2 98%   BMI 35.12 kg/m ,   BMI Body mass index is 35.12 kg/m.  Wt Readings from Last 3 Encounters:  03/07/20 234 lb 6.4 oz (106.3 kg)  02/13/20 234 lb (106.1 kg)  02/10/20 233 lb 6.4 oz (105.9 kg)    General: Patient appears comfortable at rest. HEENT: Conjunctiva and lids normal, wearing a mask. Neck: Supple, no elevated JVP or carotid bruits, no thyromegaly. Lungs: Clear to auscultation, nonlabored breathing at rest. Cardiac: Regular rate and rhythm, no S3 or significant systolic murmur, no pericardial rub. Extremities: No pitting edema.  Right radial arteriotomy sites well-healed.  ECG:  An ECG dated 02/09/2020 was personally reviewed today and demonstrated:  Normal sinus rhythm.  Recent Labwork: 01/31/2020: ALT 40; AST 27 02/10/2020: BUN 20; Creatinine, Ser 1.22; Hemoglobin 12.5; Platelets 260; Potassium 3.9; Sodium 134     Component Value Date/Time   CHOL 89 02/08/2020 0626   CHOL 109 01/31/2020 1105   TRIG 188 (H) 02/08/2020 0626   HDL 16 (L) 02/08/2020 0626   HDL 21 (L) 01/31/2020 1105   CHOLHDL 5.6 02/08/2020 0626   VLDL 38 02/08/2020 0626   LDLCALC 35 02/08/2020 0626   LDLCALC 54 01/31/2020 1105    Other Studies Reviewed Today:  Lexiscan Myoview 02/02/2020:  There was no ST segment deviation noted during  stress.  Findings consistent with moderate mid to distal anterior/anteroapical ischemial. Large are of inferior/inferolateral ischemia. Both defects are nearly completely reversible.  This is a high risk study. Risk based on decreased LVEF as well as two significant areas of ischemia.  The left ventricular ejection fraction is moderately decreased (30-44%).   Ostial LAD lesion is 95% stenosed. (Image does not allow drawing ostial LAD lesions)  Prox LAD to Mid LAD lesion is 20% stenosed. Mid LAD STENT is 20% stenosed  Dist LAD lesion is 60% stenosed.  Very small caliber 1st Mrg lesion is 85% stenosed. No change from 2012  LPAV-1 lesion is 30% stenosed just prior to giving off LPL 1.  LPAV-2 STENT is 100% stenosed -which leaves the L PDA occluded  1st LPL-1 lesion is 95% stenosed.  1st LPL-2 lesion is 80% stenosed.  Prox RCA lesion is 70% stenosed.  ----------------------------  The left ventricular ejection fraction is 35-45% by visual estimate.  There is moderate left ventricular systolic dysfunction.  LV end diastolic pressure is normal.   Cardiac catheterization 02/07/2020: SUMMARY  Severe multivessel CAD:  ? Heavily calcified eccentric 95% ostial LAD with mild in-stent restenosis of proximal-mid LAD stent and downstream diffuse mild to moderate disease with a focal 60% lesion;  ? small caliber ramus intermedius mild diffuse disease, heavily diseased OM1 (no change from 2012).   ? Very patulous dilated dominant LCx:  100% CTO of previously placed Stent leading from the AV groove circumflex to the PDA (no collateral)   95% mid LPL stenosis.  Nondominant RCA with proximal 70%.  At least moderately reduced LVEF estimated roughly 45% with apical anterior hypokinesis.  Severely elevated LVEDP - 28 mm.  RECOMMENDATIONS  Very difficult situation with significant disease.  Need to review with heart team approach discussed between CVTS surgeons and other IC physicians.   Brief discussion with Dr. Gerhardt would indicate that only the distal LAD would be a target for LIMA.  Dr. Arida feels that the ostial LAD could be treated with atherectomy and stenting as could the LPL lesion.  Perhaps this would be a better option, however we need to assess EF. ? Anticipate full CVTS consultation note tomorrow.  We will hold   off on stopping Effient until the decision is made about CABG versus PCI  If CABG: Would not be until Wednesday 11/17  If PCI: Would wait until Friday 11/12 with Dr. Arida -> depending on EF, may or may not need to consider Impella support  With unstable angina presentation-chest pain and dyspnea with minimal exertion, I do not feel comfortable discharging this gentleman till a full plan is in place.  We will admit him to telemetry on the Interventional Team Service to allow time for HEART TEAM DISCUSSION for best option going forward.  Check 2D echocardiogram  The patient has a relatively difficult diabetes regimen, will start with a Lantus (at the lower end of his range) with mealtime and sliding scale coverage.  Diabetes education team to assist since he has a Freestyle Libre' monitor.  Continue to titrate antianginals per IC Team (Team C)  Echocardiogram 02/08/2020: 1. Left ventricular ejection fraction, by estimation, is 60 to 65%. The  left ventricle has normal function. The left ventricle has no regional  wall motion abnormalities. Left ventricular diastolic parameters are  consistent with Grade I diastolic  dysfunction (impaired relaxation).  2. Right ventricular systolic function is normal. The right ventricular  size is normal. Tricuspid regurgitation signal is inadequate for assessing  PA pressure.  3. The mitral valve is normal in structure. No evidence of mitral valve  regurgitation. No evidence of mitral stenosis.  4. The aortic valve is tricuspid. Aortic valve regurgitation is not  visualized. Mild aortic valve sclerosis is  present, with no evidence of  aortic valve stenosis.  5. Aortic dilatation noted. There is mild dilatation of the ascending  aorta, measuring 40 mm.   PCI 02/09/2020:  Prox RCA lesion is 70% stenosed.  Dist LM to Prox LAD lesion is 95% stenosed.  Dist LAD lesion is 60% stenosed.  Mid LAD lesion is 20% stenosed.  Prox LAD to Mid LAD lesion is 20% stenosed.  1st Mrg lesion is 85% stenosed.  1st LPL-1 lesion is 95% stenosed.  1st LPL-2 lesion is 80% stenosed.  LPAV-1 lesion is 30% stenosed.  LPAV-2 lesion is 100% stenosed.  Post intervention, there is a 0% residual stenosis.  A drug-eluting stent was successfully placed using a SYNERGY XD 3.50X12.  Post intervention, there is a 0% residual stenosis.  A drug-eluting stent was successfully placed using a SYNERGY XD 2.25X16.   1. Successful IVUS guided orbital atherectomy and drug eluting stent placement to the ostial LAD. 2.  Successful angioplasty and drug-eluting stent placement to first PL branch of the left circumflex.  Recommendations: Continue indefinite dual antiplatelet therapy. Aggressive treatment of risk factors.  Assessment and Plan:  1.  Multivessel CAD status post DES to the circumflex in 2005, DES to the LAD/diagonal in 2012, and more recently orbital atherectomy with DES to the ostial LAD as well as DES to the first PL branch of the circumflex in November of this year.  Discussed walking plan for exercise and ultimately cardiac rehabilitation.  We will attempt to wean off Imdur to simplify his medical therapy.  Continue aspirin, Effient, Lipitor, Coreg, Zestoretic, and as needed nitroglycerin.  2.  Mixed hyperlipidemia, tolerating Lipitor with recent LDL 30-50 range.  3.  Mild orthostatic lightheadedness, no palpitations or syncope.  Systolic 130s today.  I asked him to keep an eye on blood pressure in case Zestoretic needs to be down titrated.  4.  Type 2 diabetes mellitus, followed by PCP.  He is on  Glucophage, Ozempic,   and Lantus.  Recent hemoglobin A1c 7.6%.  Medication Adjustments/Labs and Tests Ordered: Current medicines are reviewed at length with the patient today.  Concerns regarding medicines are outlined above.   Tests Ordered: No orders of the defined types were placed in this encounter.   Medication Changes: No orders of the defined types were placed in this encounter.   Disposition:  Follow up 3 months.  Signed, Samuel G. McDowell, MD, FACC 03/07/2020 8:41 AM    [facility] [place] [address], [place], [place] [address] Phone: [phone]; Fax: [phone] 

## 2020-03-07 ENCOUNTER — Encounter: Payer: Self-pay | Admitting: Cardiology

## 2020-03-07 ENCOUNTER — Ambulatory Visit: Payer: Medicare PPO | Admitting: Cardiology

## 2020-03-07 VITALS — BP 136/76 | HR 78 | Ht 68.5 in | Wt 234.4 lb

## 2020-03-07 DIAGNOSIS — E1165 Type 2 diabetes mellitus with hyperglycemia: Secondary | ICD-10-CM | POA: Diagnosis not present

## 2020-03-07 DIAGNOSIS — I25119 Atherosclerotic heart disease of native coronary artery with unspecified angina pectoris: Secondary | ICD-10-CM | POA: Diagnosis not present

## 2020-03-07 DIAGNOSIS — E782 Mixed hyperlipidemia: Secondary | ICD-10-CM

## 2020-03-07 NOTE — Patient Instructions (Addendum)
Medication Instructions:   Your physician recommends that you continue on your current medications as directed. Please refer to the Current Medication list given to you today.  Labwork:  None  Testing/Procedures:  None  Follow-Up:  Your physician recommends that you schedule a follow-up appointment in: 3 months.  Any Other Special Instructions Will Be Listed Below (If Applicable).  Please continue to monitor your blood pressures at home  If you need a refill on your cardiac medications before your next appointment, please call your pharmacy.

## 2020-03-18 ENCOUNTER — Other Ambulatory Visit: Payer: Self-pay | Admitting: Nurse Practitioner

## 2020-03-18 DIAGNOSIS — Z794 Long term (current) use of insulin: Secondary | ICD-10-CM

## 2020-03-21 ENCOUNTER — Other Ambulatory Visit: Payer: Self-pay | Admitting: Nurse Practitioner

## 2020-03-21 DIAGNOSIS — Z794 Long term (current) use of insulin: Secondary | ICD-10-CM

## 2020-03-27 ENCOUNTER — Other Ambulatory Visit: Payer: Self-pay | Admitting: Nurse Practitioner

## 2020-03-27 DIAGNOSIS — E1159 Type 2 diabetes mellitus with other circulatory complications: Secondary | ICD-10-CM

## 2020-04-11 ENCOUNTER — Encounter (HOSPITAL_COMMUNITY)
Admission: RE | Admit: 2020-04-11 | Discharge: 2020-04-11 | Disposition: A | Discharge status: Home / Self Care | Payer: Medicare PPO | Source: Ambulatory Visit | Attending: Cardiology | Admitting: Cardiology

## 2020-04-11 ENCOUNTER — Other Ambulatory Visit: Payer: Self-pay

## 2020-04-11 VITALS — BP 114/66 | HR 67 | Ht 68.5 in | Wt 234.6 lb

## 2020-04-11 DIAGNOSIS — Z955 Presence of coronary angioplasty implant and graft: Secondary | ICD-10-CM | POA: Diagnosis not present

## 2020-04-11 DIAGNOSIS — Z9889 Other specified postprocedural states: Secondary | ICD-10-CM | POA: Insufficient documentation

## 2020-04-11 DIAGNOSIS — Z79899 Other long term (current) drug therapy: Secondary | ICD-10-CM | POA: Insufficient documentation

## 2020-04-11 LAB — GLUCOSE, CAPILLARY: Glucose-Capillary: 130 mg/dL — ABNORMAL HIGH (ref 70–99)

## 2020-04-11 NOTE — Progress Notes (Signed)
Cardiac/Pulmonary Rehab Medication Review by a Pharmacist  Does the patient  feel that his/her medications are working for him/her?  yes  Has the patient been experiencing any side effects to the medications prescribed?  no  Does the patient measure his/her own blood pressure or blood glucose at home?  yes    Does the patient have any problems obtaining medications due to transportation or finances?   no  Understanding of regimen: excellent Understanding of indications: excellent Potential of compliance: excellent  Questions asked to Determine Patient Understanding of Medication Regimen:  1. What is the name of the medication?  2. What is the medication used for?  3. When should it be taken?  4. How much should be taken?  5. How will you take it?  6. What side effects should you report?  Understanding Defined as: Excellent: All questions above are correct Good: Questions 1-4 are correct Fair: Questions 1-2 are correct  Poor: 1 or none of the above questions are correct   Pharmacist comments: Overall, patient has great understanding of medications and regimen.  Patient is going to verify his atorvastatin dose and let us know- listed as 40 mg daily on med rec and he has 10 mg daily listed on his sheet.    Ramond Craver 04/11/2020 9:06 AM

## 2020-04-11 NOTE — Progress Notes (Signed)
Cardiac Individual Treatment Plan  Patient Details  Name: [name] [name] MRN: [id number] Date of Birth: 11/25/1955 Referring Provider:   Flowsheet Row CARDIAC REHAB PHASE II ORIENTATION from 04/11/2020 in [facility]  Referring Provider Dr. Ellyn Hack      Initial Encounter Date:  Flowsheet Row CARDIAC REHAB PHASE II ORIENTATION from 04/11/2020 in [facility]  Date 04/11/20      Visit Diagnosis: S/P drug eluting coronary stent placement  History of atherectomy  Patient's Home Medications on Admission:  Current Outpatient Medications:  .  atorvastatin (LIPITOR) 10 MG tablet, Take 10 mg by mouth daily., Disp: , Rfl:  .  ACCU-CHEK AVIVA PLUS test strip, USE TO CHECK BLOOD SUGAR UP TO 4 TIMES DAILY., Disp: 100 strip, Rfl: 10 .  Accu-Chek Softclix Lancets lancets, USE TO CHECK BLOOD SUGAR UP TO 4 TIMES DAILY., Disp: 100 each, Rfl: 0 .  aspirin 81 MG tablet, Take 81 mg by mouth daily., Disp: , Rfl:  .  atorvastatin (LIPITOR) 40 MG tablet, Take 1 tablet (40 mg total) by mouth daily., Disp: 30 tablet, Rfl: 11 .  blood glucose meter kit and supplies, Dispense based on patient and insurance preference. Use up to four times daily as directed. (FOR ICD-10 E10.9, E11.9)., Disp: 1 each, Rfl: 0 .  carvedilol (COREG) 12.5 MG tablet, Take 1 tablet (12.5 mg total) by mouth 2 (two) times daily with a meal., Disp: 180 tablet, Rfl: 1 .  cetirizine (ZYRTEC) 10 MG tablet, Take 10 mg by mouth daily., Disp: , Rfl:  .  Cinnamon 500 MG TABS, Take 1,000 mg by mouth 2 (two) times daily. , Disp: , Rfl:  .  CRANBERRY PO, Take 4,200 mg by mouth 2 (two) times daily. , Disp: , Rfl:  .  GLOBAL EASE INJECT PEN NEEDLES 31G X 8 MM MISC, Use up to 8 times a day Dx E11.8, Disp: 800 each, Rfl: 3 .  Insulin Aspart FlexPen 100 UNIT/ML SOPN, INJECT UP TO 45 UNITS EVERY DAY (Patient taking differently: Inject 45-75 Units into the skin 2 (two) times daily with a meal. INJECT 45-75 units  subq with breakfast and supper. Sliding scale), Disp: 15 mL, Rfl: 11 .  insulin glargine (LANTUS SOLOSTAR) 100 UNIT/ML Solostar Pen, INJECT 44 UNITS EVERY MORNING AND 75 UNITS EVERY EVENING. (Patient taking differently: Inject 45-75 Units into the skin 2 (two) times daily. INJECT 45 UNITS EVERY MORNING AND 75 UNITS EVERY EVENING.), Disp: 30 mL, Rfl: 0 .  isosorbide mononitrate (IMDUR) 30 MG 24 hr tablet, Take 1 tablet (30 mg total) by mouth daily., Disp: 90 tablet, Rfl: 3 .  lisinopril-hydrochlorothiazide (ZESTORETIC) 20-12.5 MG tablet, Take 1 tablet by mouth daily., Disp: 180 tablet, Rfl: 1 .  Melatonin 10 MG TABS, Take 10 mg by mouth at bedtime. [employer] Sleep 3, Disp: , Rfl:  .  metFORMIN (GLUCOPHAGE) 500 MG tablet, TAKE 2 TABLETS BY MOUTH TWICE DAILY., Disp: 120 tablet, Rfl: 0 .  nitroGLYCERIN (NITROSTAT) 0.4 MG SL tablet, Place 1 tablet (0.4 mg total) under the tongue every 5 (five) minutes as needed for chest pain., Disp: 25 tablet, Rfl: 3 .  OZEMPIC, 0.25 OR 0.5 MG/DOSE, 2 MG/1.5ML SOPN, INJECT 0.375 MLS (0.5 MG TOTAL) INTO THE SKIN ONCE A WEEK. (Patient taking differently: Inject 0.5 mg into the skin once a week. INJECT 0.375 MLS (0.5 MG TOTAL) INTO THE SKIN ONCE A WEEK.), Disp: 1.5 mL, Rfl: 0 .  pantoprazole (PROTONIX) 40 MG tablet, Take 1  tablet (40 mg total) by mouth daily as needed (for acid reflux)., Disp: 90 tablet, Rfl: 1 .  prasugrel (EFFIENT) 10 MG TABS tablet, TAKE 1 TABLET ONCE DAILY. (Patient taking differently: Take 10 mg by mouth daily.), Disp: 90 tablet, Rfl: 1 .  tamsulosin (FLOMAX) 0.4 MG CAPS capsule, Take 1 capsule (0.4 mg total) by mouth at bedtime., Disp: 90 capsule, Rfl: 1  Past Medical History: Past Medical History:  Diagnosis Date  . Anxiety   . Coronary atherosclerosis of native coronary artery    DES distal circumflex 2005; DES LAD/diagonal bifurcation 09/2010; DES ostial LAD and DES left PL 01/2020  . Essential hypertension   . GERD (gastroesophageal reflux  disease)   . History of kidney stones   . Mixed hyperlipidemia   . Myocardial infarction ([place])    Anterolateral with VF arrest 7/12  . Type 2 diabetes mellitus ([facility])     Tobacco Use: Social History   Tobacco Use  Smoking Status Never Smoker  Smokeless Tobacco Never Used    Labs: Recent Review Flowsheet Data    Labs for ITP Cardiac and Pulmonary Rehab Latest Ref Rng & Units 07/19/2019 10/24/2019 01/31/2020 02/07/2020 02/08/2020   Cholestrol 0 - 200 mg/dL 90(L) 97(L) 109 - 89   LDLCALC 0 - 99 mg/dL 49 53 54 - 35   HDL >40 mg/dL 19(L) 18(L) 21(L) - 16(L)   Trlycerides <150 mg/dL 116 145 209(H) - 188(H)   Hemoglobin A1c 4.8 - 5.6 % 6.9 8.1(H) 7.6(H) 7.6(H) -   TCO2 0 - 100 mmol/L - - - - -      Capillary Blood Glucose: Lab Results  Component Value Date   GLUCAP 226 (H) 02/10/2020   GLUCAP 214 (H) 02/10/2020   GLUCAP 203 (H) 02/10/2020   GLUCAP 176 (H) 02/09/2020   GLUCAP 190 (H) 02/09/2020    POCT Glucose    Row Name 04/11/20 1026             POCT Blood Glucose   Pre-Exercise 130 mg/dL              Exercise Target Goals: Exercise Program Goal: Individual exercise prescription set using results from initial 6 min walk test and THRR while considering  patient's activity barriers and safety.   Exercise Prescription Goal: Starting with aerobic activity 30 plus minutes a day, 3 days per week for initial exercise prescription. Provide home exercise prescription and guidelines that participant acknowledges understanding prior to discharge.  Activity Barriers & Risk Stratification:  Activity Barriers & Cardiac Risk Stratification - 04/11/20 0838      Activity Barriers & Cardiac Risk Stratification   Activity Barriers Joint Problems;Deconditioning;Chest Pain/Angina;Balance Concerns   knee surgery in left knee from torn meniscus   Cardiac Risk Stratification High           6 Minute Walk:  6 Minute Walk    Row Name 04/11/20 1011         6 Minute Walk   Phase  Initial     Distance 1100 feet     Walk Time 6 minutes     # of Rest Breaks 0     MPH 2.52     METS 2.1     RPE 11     VO2 Peak 8.83     Symptoms No     Resting HR 67 bpm     Resting BP 114/66     Resting Oxygen Saturation  95 %     Exercise  Oxygen Saturation  during 6 min walk 96 %     Max Ex. HR 99 bpm     Max Ex. BP 158/60     2 Minute Post BP 110/62            Oxygen Initial Assessment:   Oxygen Re-Evaluation:   Oxygen Discharge (Final Oxygen Re-Evaluation):   Initial Exercise Prescription:  Initial Exercise Prescription - 04/11/20 1000      Date of Initial Exercise RX and Referring Provider   Date 04/11/20    Referring Provider Dr. Ellyn Hack    Expected Discharge Date 07/05/20      NuStep   Level 1    SPM 80    Minutes 17      Arm Ergometer   Level 1    RPM 60    Minutes 22      Prescription Details   Duration Progress to 30 minutes of continuous aerobic without signs/symptoms of physical distress      Intensity   THRR 40-80% of Max Heartrate 62-123    Ratings of Perceived Exertion 11-15      Progression   Progression Continue progressive overload as per policy without signs/symptoms or physical distress.      Resistance Training   Training Prescription Yes    Weight 3    Reps 10-15           Perform Capillary Blood Glucose checks as needed.  Exercise Prescription Changes:   Exercise Comments:   Exercise Goals and Review:  Exercise Goals    Row Name 04/11/20 1014             Exercise Goals   Increase Physical Activity Yes       Intervention Provide advice, education, support and counseling about physical activity/exercise needs.;Develop an individualized exercise prescription for aerobic and resistive training based on initial evaluation findings, risk stratification, comorbidities and participant's personal goals.       Expected Outcomes Short Term: Attend rehab on a regular basis to increase amount of physical activity.;Long  Term: Add in home exercise to make exercise part of routine and to increase amount of physical activity.;Long Term: Exercising regularly at least 3-5 days a week.       Increase Strength and Stamina Yes       Intervention Provide advice, education, support and counseling about physical activity/exercise needs.;Develop an individualized exercise prescription for aerobic and resistive training based on initial evaluation findings, risk stratification, comorbidities and participant's personal goals.       Expected Outcomes Short Term: Perform resistance training exercises routinely during rehab and add in resistance training at home;Short Term: Increase workloads from initial exercise prescription for resistance, speed, and METs.;Long Term: Improve cardiorespiratory fitness, muscular endurance and strength as measured by increased METs and functional capacity (6MWT)       Able to understand and use rate of perceived exertion (RPE) scale Yes       Intervention Provide education and explanation on how to use RPE scale       Expected Outcomes Short Term: Able to use RPE daily in rehab to express subjective intensity level;Long Term:  Able to use RPE to guide intensity level when exercising independently       Knowledge and understanding of Target Heart Rate Range (THRR) Yes       Intervention Provide education and explanation of THRR including how the numbers were predicted and where they are located for reference       Expected  Outcomes Short Term: Able to use daily as guideline for intensity in rehab;Long Term: Able to use THRR to govern intensity when exercising independently;Short Term: Able to state/look up THRR       Able to check pulse independently Yes       Intervention Provide education and demonstration on how to check pulse in carotid and radial arteries.;Review the importance of being able to check your own pulse for safety during independent exercise       Expected Outcomes Short Term: Able to  explain why pulse checking is important during independent exercise;Long Term: Able to check pulse independently and accurately       Understanding of Exercise Prescription Yes       Intervention Provide education, explanation, and written materials on patient's individual exercise prescription       Expected Outcomes Short Term: Able to explain program exercise prescription;Long Term: Able to explain home exercise prescription to exercise independently              Exercise Goals Re-Evaluation :    Discharge Exercise Prescription (Final Exercise Prescription Changes):   Nutrition:  Target Goals: Understanding of nutrition guidelines, daily intake of sodium <1533m, cholesterol <2031m calories 30% from fat and 7% or less from saturated fats, daily to have 5 or more servings of fruits and vegetables.  Biometrics:  Pre Biometrics - 04/11/20 1015      Pre Biometrics   Height 5' 8.5" (1.74 m)    Weight 234 lb 9.1 oz (106.4 kg)    Waist Circumference 46.5 inches    Hip Circumference 44 inches    Waist to Hip Ratio 1.06 %    BMI (Calculated) 35.14    Triceps Skinfold 12 mm    % Body Fat 32.4 %    Grip Strength 28 kg    Flexibility 12.5 in    Single Leg Stand 11 seconds            Nutrition Therapy Plan and Nutrition Goals:   Nutrition Assessments:  Nutrition Assessments - 04/11/20 0843      MEDFICTS Scores   Pre Score 30          MEDIFICTS Score Key:  ?70 Need to make dietary changes   40-70 Heart Healthy Diet  ? 40 Therapeutic Level Cholesterol Diet   Picture Your Plate Scores:  <4<43nhealthy dietary pattern with much room for improvement.  41-50 Dietary pattern unlikely to meet recommendations for good health and room for improvement.  51-60 More healthful dietary pattern, with some room for improvement.   >60 Healthy dietary pattern, although there may be some specific behaviors that could be improved.    Nutrition Goals  Re-Evaluation:   Nutrition Goals Discharge (Final Nutrition Goals Re-Evaluation):   Psychosocial: Target Goals: Acknowledge presence or absence of significant depression and/or stress, maximize coping skills, provide positive support system. Participant is able to verbalize types and ability to use techniques and skills needed for reducing stress and depression.  Initial Review & Psychosocial Screening:  Initial Psych Review & Screening - 04/11/20 0833      Initial Review   Current issues with Current Sleep Concerns;Current Stress Concerns    Source of Stress Concerns Family    Comments His father is in a nursing home with dementia. His sister's husband died a few years ago and then her son was killed in a car accident.      Family Dynamics   Good Support System? Yes    Comments  His family is there for them but he states that they tend to laugh things off and do not seriously talk about their issues. He would like more serious support at times.      Barriers   Psychosocial barriers to participate in program The patient should benefit from training in stress management and relaxation.      Screening Interventions   Interventions Encouraged to exercise;To provide support and resources with identified psychosocial needs    Expected Outcomes Short Term goal: Utilizing psychosocial counselor, staff and physician to assist with identification of specific Stressors or current issues interfering with healing process. Setting desired goal for each stressor or current issue identified.;Long Term Goal: Stressors or current issues are controlled or eliminated.;Short Term goal: Identification and review with participant of any Quality of Life or Depression concerns found by scoring the questionnaire.;Long Term goal: The participant improves quality of Life and PHQ9 Scores as seen by post scores and/or verbalization of changes           Quality of Life Scores:  Quality of Life - 04/11/20 1020       Quality of Life   Select Quality of Life          Scores of 19 and below usually indicate a poorer quality of life in these areas.  A difference of  2-3 points is a clinically meaningful difference.  A difference of 2-3 points in the total score of the Quality of Life Index has been associated with significant improvement in overall quality of life, self-image, physical symptoms, and general health in studies assessing change in quality of life.  PHQ-9: Recent Review Flowsheet Data    Depression screen [facility] 2/9 04/11/2020 02/13/2020 01/31/2020 10/24/2019 07/20/2019   Decreased Interest 1 0 0 0 0   Down, Depressed, Hopeless 1 0 0 0 0   PHQ - 2 Score 2 0 0 0 0   Altered sleeping 3  - - - -   Tired, decreased energy 2 - - - -   Change in appetite 0 - - - -   Feeling bad or failure about yourself  1 - - - -   Trouble concentrating 0 - - - -   Moving slowly or fidgety/restless 0 - - - -   Suicidal thoughts 0 - - - -   PHQ-9 Score 8 - - - -   Difficult doing work/chores Somewhat difficult - - - -     Interpretation of Total Score  Total Score Depression Severity:  1-4 = Minimal depression, 5-9 = Mild depression, 10-14 = Moderate depression, 15-19 = Moderately severe depression, 20-27 = Severe depression   Psychosocial Evaluation and Intervention:  Psychosocial Evaluation - 04/11/20 1008      Psychosocial Evaluation & Interventions   Interventions Encouraged to exercise with the program and follow exercise prescription;Stress management education;Relaxation education    Comments Pt scored an 8 on his PHQ-9, and most of the score was attributed to his poor sleep patterns. He reports that he does not fall asleep until 3 or 4 in the morning. Other than his sleep concerns, he has no identifiable psychosocial concerns.    Expected Outcomes Through the patient participating in cardiac rehab and becoming more active, his sleeping patterns will improve.    Continue Psychosocial Services  No Follow  up required           Psychosocial Re-Evaluation:   Psychosocial Discharge (Final Psychosocial Re-Evaluation):   Vocational Rehabilitation: Provide vocational rehab  assistance to qualifying candidates.   Vocational Rehab Evaluation & Intervention:  Vocational Rehab - 04/11/20 3754      Initial Vocational Rehab Evaluation & Intervention   Assessment shows need for Vocational Rehabilitation No      Vocational Rehab Re-Evaulation   Comments Patient is retired and has no desire to return back to a job with any significant physical requirements.           Education: Education Goals: Education classes will be provided on a weekly basis, covering required topics. Participant will state understanding/return demonstration of topics presented.  Learning Barriers/Preferences:  Learning Barriers/Preferences - 04/11/20 0836      Learning Barriers/Preferences   Learning Barriers None    Learning Preferences Audio;Verbal Instruction           Education Topics: Hypertension, Hypertension Reduction -Define heart disease and high blood pressure. Discus how high blood pressure affects the body and ways to reduce high blood pressure.   Exercise and Your Heart -Discuss why it is important to exercise, the FITT principles of exercise, normal and abnormal responses to exercise, and how to exercise safely.   Angina -Discuss definition of angina, causes of angina, treatment of angina, and how to decrease risk of having angina.   Cardiac Medications -Review what the following cardiac medications are used for, how they affect the body, and side effects that may occur when taking the medications.  Medications include Aspirin, Beta blockers, calcium channel blockers, ACE Inhibitors, angiotensin receptor blockers, diuretics, digoxin, and antihyperlipidemics.   Congestive Heart Failure -Discuss the definition of CHF, how to live with CHF, the signs and symptoms of CHF, and how keep track  of weight and sodium intake.   Heart Disease and Intimacy -Discus the effect sexual activity has on the heart, how changes occur during intimacy as we age, and safety during sexual activity.   Smoking Cessation / COPD -Discuss different methods to quit smoking, the health benefits of quitting smoking, and the definition of COPD.   Nutrition I: Fats -Discuss the types of cholesterol, what cholesterol does to the heart, and how cholesterol levels can be controlled.   Nutrition II: Labels -Discuss the different components of food labels and how to read food label   Heart Parts/Heart Disease and PAD -Discuss the anatomy of the heart, the pathway of blood circulation through the heart, and these are affected by heart disease.   Stress I: Signs and Symptoms -Discuss the causes of stress, how stress may lead to anxiety and depression, and ways to limit stress.   Stress II: Relaxation -Discuss different types of relaxation techniques to limit stress.   Warning Signs of Stroke / TIA -Discuss definition of a stroke, what the signs and symptoms are of a stroke, and how to identify when someone is having stroke.   Knowledge Questionnaire Score:  Knowledge Questionnaire Score - 04/11/20 0841      Knowledge Questionnaire Score   Pre Score 24/24           Core Components/Risk Factors/Patient Goals at Admission:  Personal Goals and Risk Factors at Admission - 04/11/20 0837      Core Components/Risk Factors/Patient Goals on Admission    Weight Management Yes    Intervention Weight Management: Develop a combined nutrition and exercise program designed to reach desired caloric intake, while maintaining appropriate intake of nutrient and fiber, sodium and fats, and appropriate energy expenditure required for the weight goal.;Weight Management: Provide education and appropriate resources to help participant work on  and attain dietary goals.;Weight Management/Obesity: Establish reasonable  short term and long term weight goals.;Obesity: Provide education and appropriate resources to help participant work on and attain dietary goals.    Expected Outcomes Short Term: Continue to assess and modify interventions until short term weight is achieved;Long Term: Adherence to nutrition and physical activity/exercise program aimed toward attainment of established weight goal;Weight Maintenance: Understanding of the daily nutrition guidelines, which includes 25-35% calories from fat, 7% or less cal from saturated fats, less than 234m cholesterol, less than 1.5gm of sodium, & 5 or more servings of fruits and vegetables daily;Weight Loss: Understanding of general recommendations for a balanced deficit meal plan, which promotes 1-2 lb weight loss per week and includes a negative energy balance of [phone] kcal/d;Understanding recommendations for meals to include 15-35% energy as protein, 25-35% energy from fat, 35-60% energy from carbohydrates, less than 2035mof dietary cholesterol, 20-35 gm of total fiber daily;Understanding of distribution of calorie intake throughout the day with the consumption of 4-5 meals/snacks;Weight Gain: Understanding of general recommendations for a high calorie, high protein meal plan that promotes weight gain by distributing calorie intake throughout the day with the consumption for 4-5 meals, snacks, and/or supplements    Improve shortness of breath with ADL's Yes    Intervention Provide education, individualized exercise plan and daily activity instruction to help decrease symptoms of SOB with activities of daily living.    Expected Outcomes Short Term: Improve cardiorespiratory fitness to achieve a reduction of symptoms when performing ADLs;Long Term: Be able to perform more ADLs without symptoms or delay the onset of symptoms    Diabetes Yes    Intervention Provide education about signs/symptoms and action to take for hypo/hyperglycemia.;Provide education about proper  nutrition, including hydration, and aerobic/resistive exercise prescription along with prescribed medications to achieve blood glucose in normal ranges: Fasting glucose 65-99 mg/dL    Expected Outcomes Short Term: Participant verbalizes understanding of the signs/symptoms and immediate care of hyper/hypoglycemia, proper foot care and importance of medication, aerobic/resistive exercise and nutrition plan for blood glucose control.;Long Term: Attainment of HbA1C < 7%.    Stress Yes    Intervention Offer individual and/or small group education and counseling on adjustment to heart disease, stress management and health-related lifestyle change. Teach and support self-help strategies.;Refer participants experiencing significant psychosocial distress to appropriate mental health specialists for further evaluation and treatment. When possible, include family members and significant others in education/counseling sessions.    Expected Outcomes Short Term: Participant demonstrates changes in health-related behavior, relaxation and other stress management skills, ability to obtain effective social support, and compliance with psychotropic medications if prescribed.;Long Term: Emotional wellbeing is indicated by absence of clinically significant psychosocial distress or social isolation.           Core Components/Risk Factors/Patient Goals Review:    Core Components/Risk Factors/Patient Goals at Discharge (Final Review):    ITP Comments:   Comments: Patient arrived for 1st visit/orientation/education at 0800. Patient was referred to CR by Dr. HaEllyn Hackue to S/P drug eluting coronary stent placement (Z95.5) and history of atherectomy (Z(Y24.825 During orientation advised patient on arrival and appointment times what to wear, what to do before, during and after exercise. Reviewed attendance and class policy.  Pt is scheduled to return Cardiac Rehab on 04/15/2020 at 0930. Pt was advised to come to class 15  minutes before class starts.  Discussed RPE/Dpysnea scales. Patient participated in warm up stretches. Patient was able to complete 6 minute walk test.  Telemetry: NSR.  Patient was measured for the equipment. Discussed equipment safety with patient. Took patient pre-anthropometric measurements. Patient finished visit at [facility].

## 2020-04-13 ENCOUNTER — Other Ambulatory Visit: Payer: Self-pay | Admitting: Nurse Practitioner

## 2020-04-13 DIAGNOSIS — Z794 Long term (current) use of insulin: Secondary | ICD-10-CM

## 2020-04-13 DIAGNOSIS — E1159 Type 2 diabetes mellitus with other circulatory complications: Secondary | ICD-10-CM

## 2020-04-15 ENCOUNTER — Encounter (HOSPITAL_COMMUNITY): Payer: Medicare PPO

## 2020-04-17 ENCOUNTER — Encounter (HOSPITAL_COMMUNITY)
Admission: RE | Admit: 2020-04-17 | Discharge: 2020-04-17 | Disposition: A | Discharge status: Home / Self Care | Payer: Medicare PPO | Source: Ambulatory Visit | Attending: Cardiology | Admitting: Cardiology

## 2020-04-17 ENCOUNTER — Other Ambulatory Visit: Payer: Self-pay

## 2020-04-17 DIAGNOSIS — Z9889 Other specified postprocedural states: Secondary | ICD-10-CM | POA: Diagnosis not present

## 2020-04-17 DIAGNOSIS — Z955 Presence of coronary angioplasty implant and graft: Secondary | ICD-10-CM

## 2020-04-17 DIAGNOSIS — Z79899 Other long term (current) drug therapy: Secondary | ICD-10-CM | POA: Diagnosis not present

## 2020-04-17 NOTE — Progress Notes (Signed)
Daily Session Note  Patient Details  Name: [name] [name] MRN: [id number] Date of Birth: [birth year] Referring Provider:   Flowsheet Row CARDIAC REHAB PHASE II ORIENTATION from 04/11/2020 in [place]  Referring Provider Dr. Ellyn Hack      Encounter Date: 04/17/2020  Check In:  Session Check In - 04/17/20 0930      Check-In   Supervising physician immediately available to respond to emergencies [facility] MD immediately available    Physician(s) Dr. Domenic Polite    Location [facility]    Staff Present Cathren Harsh, MS, Exercise Physiologist;Dalton Kris Mouton, MS, ACSM-CEP, Exercise Physiologist    Virtual Visit No    Medication changes reported     No    Fall or balance concerns reported    No    Tobacco Cessation No Change    Warm-up and Cool-down Performed as group-led instruction    Resistance Training Performed Yes    VAD Patient? No    PAD/SET Patient? No      Pain Assessment   Currently in Pain? No/denies    Multiple Pain Sites No           Capillary Blood Glucose: No results found for this or any previous visit (from the past 24 hour(s)).    Social History   Tobacco Use  Smoking Status Never Smoker  Smokeless Tobacco Never Used    Goals Met:  Independence with exercise equipment Exercise tolerated well No report of cardiac concerns or symptoms Strength training completed today  Goals Unmet:  Not Applicable  Comments: checkout 75   Dr. Kathie Dike is Medical Director for [facility].

## 2020-04-18 ENCOUNTER — Other Ambulatory Visit: Payer: Self-pay | Admitting: Nurse Practitioner

## 2020-04-18 DIAGNOSIS — Z794 Long term (current) use of insulin: Secondary | ICD-10-CM

## 2020-04-18 DIAGNOSIS — E1159 Type 2 diabetes mellitus with other circulatory complications: Secondary | ICD-10-CM

## 2020-04-19 ENCOUNTER — Other Ambulatory Visit: Payer: Self-pay

## 2020-04-19 ENCOUNTER — Encounter (HOSPITAL_COMMUNITY)
Admission: RE | Admit: 2020-04-19 | Discharge: 2020-04-19 | Disposition: A | Discharge status: Home / Self Care | Payer: Medicare PPO | Source: Ambulatory Visit | Attending: Cardiology | Admitting: Cardiology

## 2020-04-19 DIAGNOSIS — Z79899 Other long term (current) drug therapy: Secondary | ICD-10-CM | POA: Diagnosis not present

## 2020-04-19 DIAGNOSIS — Z955 Presence of coronary angioplasty implant and graft: Secondary | ICD-10-CM

## 2020-04-19 DIAGNOSIS — Z9889 Other specified postprocedural states: Secondary | ICD-10-CM

## 2020-04-19 NOTE — Progress Notes (Signed)
Daily Session Note  Patient Details  Name: [name] [name] MRN: [id number] Date of Birth: 1955-09-10 Referring Provider:   Flowsheet Row CARDIAC REHAB PHASE II ORIENTATION from 04/11/2020 in [place]  Referring Provider Dr. Ellyn Hack      Encounter Date: 04/19/2020  Check In:  Session Check In - 04/19/20 0930      Check-In   Supervising physician immediately available to respond to emergencies [facility] MD immediately available    Physician(s) Domenic Polite    Location [facility]    Staff Present [name], RN;Debra Wynetta Emery, RN, BSN;Madison Audria Nine, MS, Exercise Physiologist;Dalton Kris Mouton, MS, ACSM-CEP, Exercise Physiologist    Virtual Visit No    Medication changes reported     No    Fall or balance concerns reported    No    Tobacco Cessation No Change    Warm-up and Cool-down Performed as group-led instruction    Resistance Training Performed Yes    VAD Patient? No    PAD/SET Patient? No      Pain Assessment   Currently in Pain? No/denies    Multiple Pain Sites No           Capillary Blood Glucose: No results found for this or any previous visit (from the past 24 hour(s)).    Social History   Tobacco Use  Smoking Status Never Smoker  Smokeless Tobacco Never Used    Goals Met:  Independence with exercise equipment Exercise tolerated well No report of cardiac concerns or symptoms Strength training completed today  Goals Unmet:  Not Applicable  Comments: check out @ 10:30    Dr. Kathie Dike is Medical Director for [facility].

## 2020-04-22 ENCOUNTER — Other Ambulatory Visit: Payer: Self-pay

## 2020-04-22 ENCOUNTER — Encounter (HOSPITAL_COMMUNITY)
Admission: RE | Admit: 2020-04-22 | Discharge: 2020-04-22 | Disposition: A | Discharge status: Home / Self Care | Payer: Medicare PPO | Source: Ambulatory Visit | Attending: Cardiology | Admitting: Cardiology

## 2020-04-22 VITALS — Wt 238.1 lb

## 2020-04-22 DIAGNOSIS — Z9889 Other specified postprocedural states: Secondary | ICD-10-CM | POA: Diagnosis not present

## 2020-04-22 DIAGNOSIS — Z955 Presence of coronary angioplasty implant and graft: Secondary | ICD-10-CM | POA: Diagnosis not present

## 2020-04-22 DIAGNOSIS — Z79899 Other long term (current) drug therapy: Secondary | ICD-10-CM | POA: Diagnosis not present

## 2020-04-22 NOTE — Progress Notes (Signed)
Daily Session Note  Patient Details  Name: KALIK HOARE MRN: [id number] Date of Birth: [birth year] Referring Provider:   Flowsheet Row CARDIAC REHAB PHASE II ORIENTATION from 04/11/2020 in [place]  Referring Provider Dr. Ellyn Hack      Encounter Date: 04/22/2020  Check In:  Session Check In - 04/22/20 0930      Check-In   Supervising physician immediately available to respond to emergencies [facility] immediately available    Physician(s) Dr. Harl Bowie    Location [facility]    Staff Present Aundra Dubin, RN, Bjorn Loser, MS, ACSM-CEP, Exercise Physiologist    Virtual Visit No    Medication changes reported     No    Fall or balance concerns reported    No    Tobacco Cessation No Change    Warm-up and Cool-down Performed as group-led instruction    Resistance Training Performed Yes    VAD Patient? No    PAD/SET Patient? No      Pain Assessment   Currently in Pain? No/denies    Multiple Pain Sites No           Capillary Blood Glucose: No results found for this or any previous visit (from the past 24 hour(s)).    Social History   Tobacco Use  Smoking Status Never Smoker  Smokeless Tobacco Never Used    Goals Met:  Independence with exercise equipment Exercise tolerated well No report of cardiac concerns or symptoms Strength training completed today  Goals Unmet:  Not Applicable  Comments: checkout time is 1030   Dr. Kathie Dike is Medical Director for [facility].

## 2020-04-24 ENCOUNTER — Other Ambulatory Visit: Payer: Self-pay

## 2020-04-24 ENCOUNTER — Encounter (HOSPITAL_COMMUNITY)
Admission: RE | Admit: 2020-04-24 | Discharge: 2020-04-24 | Disposition: A | Discharge status: Home / Self Care | Payer: Medicare PPO | Source: Ambulatory Visit | Attending: Cardiology | Admitting: Cardiology

## 2020-04-24 DIAGNOSIS — Z9889 Other specified postprocedural states: Secondary | ICD-10-CM | POA: Diagnosis not present

## 2020-04-24 DIAGNOSIS — Z79899 Other long term (current) drug therapy: Secondary | ICD-10-CM | POA: Diagnosis not present

## 2020-04-24 DIAGNOSIS — Z955 Presence of coronary angioplasty implant and graft: Secondary | ICD-10-CM

## 2020-04-24 NOTE — Progress Notes (Signed)
Daily Session Note  Patient Details  Name: [name] [name] MRN: [id number] Date of Birth: Jan 07, 1956 Referring Provider:   Flowsheet Row CARDIAC REHAB PHASE II ORIENTATION from 04/11/2020 in [place]  Referring Provider Dr. Ellyn Hack      Encounter Date: 04/24/2020  Check In:  Session Check In - 04/24/20 0930      Check-In   Supervising physician immediately available to respond to emergencies [facility] immediately available    Physician(s) Dr. Domenic Polite    Location [facility]    Staff Present Cathren Harsh, MS, Exercise Physiologist;Dalton Kris Mouton, MS, ACSM-CEP, Exercise Physiologist    Virtual Visit No    Medication changes reported     No    Fall or balance concerns reported    No    Tobacco Cessation No Change    Warm-up and Cool-down Performed as group-led instruction    Resistance Training Performed Yes    VAD Patient? No    PAD/SET Patient? No      Pain Assessment   Currently in Pain? No/denies    Multiple Pain Sites No           Capillary Blood Glucose: No results found for this or any previous visit (from the past 24 hour(s)).    Social History   Tobacco Use  Smoking Status Never Smoker  Smokeless Tobacco Never Used    Goals Met:  Independence with exercise equipment Exercise tolerated well No report of cardiac concerns or symptoms Strength training completed today  Goals Unmet:  Not Applicable  Comments: check out 1030   Dr. Kathie Dike is Medical Director for [facility].

## 2020-04-26 ENCOUNTER — Encounter (HOSPITAL_COMMUNITY)
Admission: RE | Admit: 2020-04-26 | Discharge: 2020-04-26 | Disposition: A | Discharge status: Home / Self Care | Payer: Medicare PPO | Source: Ambulatory Visit | Attending: Cardiology | Admitting: Cardiology

## 2020-04-26 ENCOUNTER — Other Ambulatory Visit: Payer: Self-pay

## 2020-04-26 DIAGNOSIS — Z9889 Other specified postprocedural states: Secondary | ICD-10-CM | POA: Diagnosis not present

## 2020-04-26 DIAGNOSIS — Z955 Presence of coronary angioplasty implant and graft: Secondary | ICD-10-CM | POA: Diagnosis not present

## 2020-04-26 DIAGNOSIS — Z79899 Other long term (current) drug therapy: Secondary | ICD-10-CM | POA: Diagnosis not present

## 2020-04-26 NOTE — Progress Notes (Signed)
Daily Session Note  Patient Details  Name: [name] [name] MRN: [id number] Date of Birth: 01-01-1956 Referring Provider:   Flowsheet Row CARDIAC REHAB PHASE II ORIENTATION from 04/11/2020 in [place]  Referring Provider Dr. Ellyn Hack      Encounter Date: 04/26/2020  Check In:  Session Check In - 04/26/20 0930      Check-In   Supervising physician immediately available to respond to emergencies [facility] MD immediately available    Physician(s) Dr. Harl Bowie    Location [facility]    Staff Present Cathren Harsh, MS, Exercise Physiologist;Dalton Kris Mouton, MS, ACSM-CEP, Exercise Physiologist    Virtual Visit No    Medication changes reported     No    Fall or balance concerns reported    No    Tobacco Cessation No Change    Warm-up and Cool-down Performed as group-led instruction    Resistance Training Performed Yes    VAD Patient? No    PAD/SET Patient? No      Pain Assessment   Currently in Pain? No/denies    Multiple Pain Sites No           Capillary Blood Glucose: No results found for this or any previous visit (from the past 24 hour(s)).    Social History   Tobacco Use  Smoking Status Never Smoker  Smokeless Tobacco Never Used    Goals Met:  Independence with exercise equipment Exercise tolerated well No report of cardiac concerns or symptoms Strength training completed today  Goals Unmet:  Not Applicable  Comments: check out 1030   Dr. Kathie Dike is Medical Director for [facility].

## 2020-04-26 NOTE — Progress Notes (Signed)
I have reviewed a Home Exercise Prescription with [name] [name] . [name] is currently exercising at home.  The patient was advised to walk 2-3 days a week for 30-40 minutes outside of rehab.  [name] and I discussed how to progress their exercise prescription.  The patient stated that their goals were to lose weight and get stronger.  The patient stated that they understand the exercise prescription.  We reviewed exercise guidelines, target heart rate during exercise, RPE Scale, weather conditions, NTG use, endpoints for exercise, warmup and cool down.  Patient is encouraged to come to me with any questions. I will continue to follow up with the patient to assist them with progression and safety.

## 2020-04-29 ENCOUNTER — Other Ambulatory Visit: Payer: Self-pay

## 2020-04-29 ENCOUNTER — Encounter (HOSPITAL_COMMUNITY)
Admission: RE | Admit: 2020-04-29 | Discharge: 2020-04-29 | Disposition: A | Discharge status: Home / Self Care | Payer: Medicare PPO | Source: Ambulatory Visit | Attending: Cardiology | Admitting: Cardiology

## 2020-04-29 DIAGNOSIS — Z79899 Other long term (current) drug therapy: Secondary | ICD-10-CM | POA: Diagnosis not present

## 2020-04-29 DIAGNOSIS — Z955 Presence of coronary angioplasty implant and graft: Secondary | ICD-10-CM

## 2020-04-29 DIAGNOSIS — Z9889 Other specified postprocedural states: Secondary | ICD-10-CM

## 2020-04-29 NOTE — Progress Notes (Signed)
Daily Session Note  Patient Details  Name: [name] [name] MRN: [id number] Date of Birth: 1956-01-10 Referring Provider:   Flowsheet Row CARDIAC REHAB PHASE II ORIENTATION from 04/11/2020 in [place]  Referring Provider Dr. Ellyn Hack      Encounter Date: 04/29/2020  Check In:  Session Check In - 04/29/20 0930      Check-In   Supervising physician immediately available to respond to emergencies [facility] MD immediately available    Physician(s) Domenic Polite    Location [facility]    Staff Present [name], RN;Debra Wynetta Emery, RN, BSN;Madison Audria Nine, MS, Exercise Physiologist;Dalton Kris Mouton, MS, ACSM-CEP, Exercise Physiologist    Virtual Visit No    Medication changes reported     No    Fall or balance concerns reported    No    Tobacco Cessation No Change    Warm-up and Cool-down Performed as group-led instruction    Resistance Training Performed Yes    VAD Patient? No    PAD/SET Patient? No      Pain Assessment   Currently in Pain? No/denies    Multiple Pain Sites No           Capillary Blood Glucose: No results found for this or any previous visit (from the past 24 hour(s)).    Social History   Tobacco Use  Smoking Status Never Smoker  Smokeless Tobacco Never Used    Goals Met:  Independence with exercise equipment Exercise tolerated well No report of cardiac concerns or symptoms Strength training completed today  Goals Unmet:  Not Applicable  Comments: check out @ 10:30   Dr. Kathie Dike is Medical Director for [facility].

## 2020-05-01 ENCOUNTER — Encounter (HOSPITAL_COMMUNITY): Payer: Medicare PPO

## 2020-05-03 ENCOUNTER — Other Ambulatory Visit: Payer: Self-pay

## 2020-05-03 ENCOUNTER — Encounter (HOSPITAL_COMMUNITY)
Admission: RE | Admit: 2020-05-03 | Discharge: 2020-05-03 | Disposition: A | Discharge status: Home / Self Care | Payer: Medicare PPO | Source: Ambulatory Visit | Attending: Cardiology | Admitting: Cardiology

## 2020-05-03 DIAGNOSIS — Z955 Presence of coronary angioplasty implant and graft: Secondary | ICD-10-CM | POA: Insufficient documentation

## 2020-05-03 DIAGNOSIS — Z9889 Other specified postprocedural states: Secondary | ICD-10-CM | POA: Diagnosis not present

## 2020-05-03 NOTE — Progress Notes (Signed)
Daily Session Note  Patient Details  Name: THIERRY DOBOSZ MRN: [id number] Date of Birth: [birth year] Referring Provider:   Flowsheet Row CARDIAC REHAB PHASE II ORIENTATION from 04/11/2020 in [place]  Referring Provider Dr. Ellyn Hack      Encounter Date: 05/03/2020  Check In:  Session Check In - 05/03/20 0930      Check-In   Supervising physician immediately available to respond to emergencies [facility] MD immediately available    Physician(s) Dr. Harl Bowie    Location [facility]    Staff Present Aundra Dubin, RN, Bjorn Loser, MS, ACSM-CEP, Exercise Physiologist    Virtual Visit No    Medication changes reported     No    Fall or balance concerns reported    No    Tobacco Cessation No Change    Warm-up and Cool-down Performed as group-led instruction    Resistance Training Performed Yes    VAD Patient? No    PAD/SET Patient? No      Pain Assessment   Currently in Pain? No/denies    Multiple Pain Sites No           Capillary Blood Glucose: No results found for this or any previous visit (from the past 24 hour(s)).    Social History   Tobacco Use  Smoking Status Never Smoker  Smokeless Tobacco Never Used    Goals Met:  Independence with exercise equipment Exercise tolerated well No report of cardiac concerns or symptoms Strength training completed today  Goals Unmet:  Not Applicable  Comments: checkout time is 1030   Dr. Kathie Dike is Medical Director for [facility] Pul[facility].

## 2020-05-06 ENCOUNTER — Encounter (HOSPITAL_COMMUNITY)
Admission: RE | Admit: 2020-05-06 | Discharge: 2020-05-06 | Disposition: A | Discharge status: Home / Self Care | Payer: Medicare PPO | Source: Ambulatory Visit | Attending: Cardiology | Admitting: Cardiology

## 2020-05-06 ENCOUNTER — Ambulatory Visit: Payer: Medicare PPO | Admitting: Nurse Practitioner

## 2020-05-06 ENCOUNTER — Encounter: Payer: Self-pay | Admitting: Nurse Practitioner

## 2020-05-06 ENCOUNTER — Other Ambulatory Visit: Payer: Self-pay

## 2020-05-06 VITALS — Wt 237.9 lb

## 2020-05-06 VITALS — BP 119/66 | HR 70 | Temp 97.5°F | Resp 20 | Ht 68.0 in | Wt 238.0 lb

## 2020-05-06 DIAGNOSIS — E118 Type 2 diabetes mellitus with unspecified complications: Secondary | ICD-10-CM

## 2020-05-06 DIAGNOSIS — Z9889 Other specified postprocedural states: Secondary | ICD-10-CM | POA: Diagnosis not present

## 2020-05-06 DIAGNOSIS — R3911 Hesitancy of micturition: Secondary | ICD-10-CM

## 2020-05-06 DIAGNOSIS — N401 Enlarged prostate with lower urinary tract symptoms: Secondary | ICD-10-CM

## 2020-05-06 DIAGNOSIS — E6609 Other obesity due to excess calories: Secondary | ICD-10-CM | POA: Diagnosis not present

## 2020-05-06 DIAGNOSIS — Z794 Long term (current) use of insulin: Secondary | ICD-10-CM | POA: Diagnosis not present

## 2020-05-06 DIAGNOSIS — I10 Essential (primary) hypertension: Secondary | ICD-10-CM | POA: Diagnosis not present

## 2020-05-06 DIAGNOSIS — E1159 Type 2 diabetes mellitus with other circulatory complications: Secondary | ICD-10-CM | POA: Diagnosis not present

## 2020-05-06 DIAGNOSIS — E782 Mixed hyperlipidemia: Secondary | ICD-10-CM

## 2020-05-06 DIAGNOSIS — K219 Gastro-esophageal reflux disease without esophagitis: Secondary | ICD-10-CM

## 2020-05-06 DIAGNOSIS — I2510 Atherosclerotic heart disease of native coronary artery without angina pectoris: Secondary | ICD-10-CM

## 2020-05-06 DIAGNOSIS — Z955 Presence of coronary angioplasty implant and graft: Secondary | ICD-10-CM

## 2020-05-06 DIAGNOSIS — Z6834 Body mass index (BMI) 34.0-34.9, adult: Secondary | ICD-10-CM

## 2020-05-06 LAB — BAYER DCA HB A1C WAIVED: HB A1C (BAYER DCA - WAIVED): 7.1 % — ABNORMAL HIGH (ref <7.0)

## 2020-05-06 MED ORDER — METFORMIN HCL 500 MG PO TABS
1000.0000 mg | ORAL_TABLET | Freq: Two times a day (BID) | ORAL | 1 refills | Status: DC
Start: 2020-05-06 — End: 2020-07-16

## 2020-05-06 MED ORDER — OZEMPIC (0.25 OR 0.5 MG/DOSE) 2 MG/1.5ML ~~LOC~~ SOPN: 2.0000 mg | PEN_INJECTOR | SUBCUTANEOUS | 1 refills | Status: DC

## 2020-05-06 NOTE — Progress Notes (Signed)
Patient ID: [name] [name], male   DOB: 08-25-1955, 65 y.o.   MRN: [id number]   Subjective:    Patient ID: [name] [name], male    DOB: Mar 13, 1956, 65 y.o.   MRN: [id number]   Chief Complaint: Follow up visit for chronic disease management.    HPI:  1. Type 2 diabetes mellitus with other circulatory complication, with long-term current use of insulin (HCC) AM FSBG in the 90's. Denies sx of low BS. Low carb carb diet going well. Regular exercise with cardiac rehab.   Today's A1C is 7.1%  Lab Results  Component Value Date   HGBA1C 7.6 (H) 02/07/2020     2. Mixed hyperlipidemia  Low fat diet. Exercise regularly. Cardiologist is following/managing with Atorvastin. Denies side effects from statin.   Lab Results  Component Value Date   CHOL 89 02/08/2020   CHOL 109 01/31/2020   CHOL 97 (L) 10/24/2019   Lab Results  Component Value Date   HDL 16 (L) 02/08/2020   HDL 21 (L) 01/31/2020   HDL 18 (L) 10/24/2019   Lab Results  Component Value Date   LDLCALC 35 02/08/2020   LDLCALC 54 01/31/2020   LDLCALC 53 10/24/2019   Lab Results  Component Value Date   TRIG 188 (H) 02/08/2020   TRIG 209 (H) 01/31/2020   TRIG 145 10/24/2019   Lab Results  Component Value Date   CHOLHDL 5.6 02/08/2020   CHOLHDL 5.2 (H) 01/31/2020   CHOLHDL 5.4 (H) 10/24/2019   No results found for: LDLDIRECT   3. Essential hypertension, benign Doing well on lisinopril/hctz. Denies side effects. Tolerating well. Cardiac rehab monitoring BP's weekly running 120/60's.   BP Readings from Last 3 Encounters:  05/06/20 119/66  04/11/20 114/66  03/07/20 136/76     5. Atherosclerosis of native coronary artery of native heart without angina pectoris Managed by cardiology. Does have intermittent CP on exertion. On Imdur.    7. Gastroesophageal reflux disease without esophagitis Doing well on pantoprazole. Does not eat "heavy meals" or late at night.   8. Benign prostatic hyperplasia with urinary  hesitancy Doing well on flowmax.   9. Class 1 obesity due to excess calories with serious comorbidity and body mass index (BMI) of 34.0 to 34.9 in adult Participating cardiac rehab. Still has 9 more weeks remaining. Will continue low fat/low carb diet. Exercising regularly. Walks dog daily.   Wt Readings from Last 3 Encounters:  05/06/20 238 lb (108 kg)  05/06/20 237 lb 14 oz (107.9 kg)  04/22/20 238 lb 1.6 oz (108 kg)     Current Outpatient Medications on File Prior to Visit  Medication Sig Dispense Refill  . ACCU-CHEK AVIVA PLUS test strip USE TO CHECK BLOOD SUGAR UP TO 4 TIMES DAILY. 100 strip 10  . Accu-Chek Softclix Lancets lancets USE TO CHECK BLOOD SUGAR UP TO 4 TIMES DAILY. 100 each 0  . aspirin 81 MG tablet Take 81 mg by mouth daily.    Marland Kitchen atorvastatin (LIPITOR) 40 MG tablet Take 1 tablet (40 mg total) by mouth daily. 30 tablet 11  . blood glucose meter kit and supplies Dispense based on patient and insurance preference. Use up to four times daily as directed. (FOR ICD-10 E10.9, E11.9). 1 each 0  . carvedilol (COREG) 12.5 MG tablet Take 1 tablet (12.5 mg total) by mouth 2 (two) times daily with a meal. 180 tablet 1  . cetirizine (ZYRTEC) 10 MG tablet Take 10 mg by mouth daily.    Marland Kitchen  Cinnamon 500 MG TABS Take 1,000 mg by mouth 2 (two) times daily.     Marland Kitchen CRANBERRY PO Take 4,200 mg by mouth 2 (two) times daily.     Marland Kitchen GLOBAL EASE INJECT PEN NEEDLES 31G X 8 MM MISC Use up to 8 times a day Dx E11.8 800 each 3  . Insulin Aspart FlexPen 100 UNIT/ML SOPN INJECT UP TO 45 UNITS EVERY DAY (Patient taking differently: Inject 45-75 Units into the skin 2 (two) times daily with a meal. INJECT 45-75 units subq with breakfast and supper. Sliding scale) 15 mL 11  . LANTUS SOLOSTAR 100 UNIT/ML Solostar Pen INJECT 44 UNITS EVERY MORNING AND 75 UNITS EVERY EVENING. 30 mL 0  . lisinopril-hydrochlorothiazide (ZESTORETIC) 20-12.5 MG tablet Take 1 tablet by mouth daily. 180 tablet 1  . metFORMIN  (GLUCOPHAGE) 500 MG tablet TAKE 2 TABLETS BY MOUTH TWICE DAILY. 120 tablet 0  . nitroGLYCERIN (NITROSTAT) 0.4 MG SL tablet Place 1 tablet (0.4 mg total) under the tongue every 5 (five) minutes as needed for chest pain. 25 tablet 3  . OZEMPIC, 0.25 OR 0.5 MG/DOSE, 2 MG/1.5ML SOPN INJECT 0.375 MLS (0.5 MG TOTAL) INTO THE SKIN ONCE A WEEK. 1.5 mL 0  . pantoprazole (PROTONIX) 40 MG tablet Take 1 tablet (40 mg total) by mouth daily as needed (for acid reflux). 90 tablet 1  . prasugrel (EFFIENT) 10 MG TABS tablet TAKE 1 TABLET ONCE DAILY. (Patient taking differently: Take 10 mg by mouth daily.) 90 tablet 1  . tamsulosin (FLOMAX) 0.4 MG CAPS capsule Take 1 capsule (0.4 mg total) by mouth at bedtime. 90 capsule 1  . isosorbide mononitrate (IMDUR) 30 MG 24 hr tablet Take 1 tablet (30 mg total) by mouth daily. 90 tablet 3   No current facility-administered medications on file prior to visit.     Past Surgical History:  Procedure Laterality Date  . CARDIAC CATHETERIZATION  2012  . CAROTID STENT     stents x 2   . CHOLECYSTECTOMY N/A 12/04/2015   Procedure: LAPAROSCOPIC CHOLECYSTECTOMY;  Surgeon: Aviva Signs, MD;  Location: [facility];  Service: General;  Laterality: N/A;  . CHOLECYSTECTOMY, LAPAROSCOPIC  12/05/2015  . CORONARY ANGIOPLASTY  2012   STENT X 1 2012, STENT X 1 YRS BEFORE  . CORONARY ATHERECTOMY N/A 02/09/2020   Procedure: CORONARY ATHERECTOMY;  Surgeon: Wellington Hampshire, MD;  Location: [place] [facility];  Service: Cardiovascular;  Laterality: N/A;  . CORONARY STENT INTERVENTION N/A 02/09/2020   Procedure: CORONARY STENT INTERVENTION;  Surgeon: Wellington Hampshire, MD;  Location: [place] [facility];  Service: Cardiovascular;  Laterality: N/A;  . HYDROCELE EXCISION Bilateral 06/02/2017   Procedure: HYDROCELECTOMY ADULT;  Surgeon: Ceasar Mons, MD;  Location: [facility];  Service: Urology;  Laterality: Bilateral;  ONLY NEEDS 45 MIN  . INGUINAL HERNIA REPAIR     RIGHT GROIN  .  INTRAVASCULAR ULTRASOUND/IVUS N/A 02/09/2020   Procedure: Intravascular Ultrasound/IVUS;  Surgeon: Wellington Hampshire, MD;  Location: [place] [facility];  Service: Cardiovascular;  Laterality: N/A;  . KNEE ARTHROSCOPY Left 05/23/2019   Procedure: LEFT KNEE ARTHROSCOPY AND DEBRIDEMENT PARTIAL MEDIAL MENISECTOMY;  Surgeon: Newt Minion, MD;  Location: [place];  Service: Orthopedics;  Laterality: Left;  . LEFT HEART CATH AND CORONARY ANGIOGRAPHY N/A 02/07/2020   Procedure: LEFT HEART CATH AND CORONARY ANGIOGRAPHY;  Surgeon: Leonie Man, MD;  Location: [place] [facility];  Service: Cardiovascular;  Laterality: N/A;  . TRIGGER FINGER RELEASE Right  01/08/2015   Procedure: RELEASE TRIGGER FINGER/A-1 PULLEY RIGHT MIDDLE FINGER;  Surgeon: Daryll Brod, MD;  Location: [place];  Service: Orthopedics;  Laterality: Right;    Family History  Problem Relation Age of Onset  . Hyperlipidemia Mother   . Hypertension Mother   . Hyperlipidemia Father   . Hypertension Father   . Diabetes Father   . Dementia Father   . Diabetes Maternal Grandfather     New complaints: None  Social history: Lives with wife and [place] husky.   Controlled substance contract:  N/A    Review of Systems  Constitutional: Negative for appetite change, fatigue and fever.  Respiratory: Negative for cough, shortness of breath and wheezing.   Cardiovascular: Negative for chest pain and palpitations.       CP on exertion.  Gastrointestinal: Negative for abdominal distention, abdominal pain, constipation, diarrhea and nausea.  Genitourinary: Negative for dysuria and frequency.  Musculoskeletal: Negative for back pain.  Neurological: Positive for dizziness. Negative for headaches.       Dizziness on positional changes.  Psychiatric/Behavioral: Positive for sleep disturbance. The patient is not nervous/anxious.        Some insomnia issues.        Objective:   Physical  Exam Constitutional:      Appearance: Normal appearance.  HENT:     Head: Normocephalic.  Cardiovascular:     Rate and Rhythm: Normal rate and regular rhythm.     Pulses: Normal pulses.     Heart sounds: Normal heart sounds.  Pulmonary:     Effort: Pulmonary effort is normal.     Breath sounds: Normal breath sounds.  Abdominal:     General: Bowel sounds are normal.     Palpations: Abdomen is soft.  Musculoskeletal:        General: Normal range of motion.     Cervical back: Normal range of motion and neck supple.  Skin:    General: Skin is warm and dry.     Capillary Refill: Capillary refill takes less than 2 seconds.  Neurological:     Mental Status: He is alert and oriented to person, place, and time.  Psychiatric:        Mood and Affect: Mood normal.        Behavior: Behavior normal.        Thought Content: Thought content normal.        Judgment: Judgment normal.     Vitals:   05/06/20 1125  BP: 119/66  Pulse: 70  Resp: 20  Temp: (!) 97.5 F (36.4 C)  SpO2: 95%          Assessment & Plan:   [name] [name] comes in today with chief complaint of Medical Management of Chronic Issues   Diagnosis and orders addressed:  1. Type 2 diabetes mellitus with other circulatory complication, with long-term current use of insulin (HCC) Continue low carb/low sugar diet. Continue regular exercise. Continue current medication plan.   - Bayer DCA Hb A1c Waived - Microalbumin / creatinine urine ratio - metFORMIN (GLUCOPHAGE) 500 MG tablet; Take 2 tablets (1,000 mg total) by mouth 2 (two) times daily.  Dispense: 120 tablet; Refill: 1 - Semaglutide,0.25 or 0.5MG/DOS, (OZEMPIC, 0.25 OR 0.5 MG/DOSE,) 2 MG/1.5ML SOPN; Inject 2 mg into the skin once a week.  Dispense: 12 mL; Refill: 1  2. Mixed hyperlipidemia Managed by cardiology. Continue low fat diet, regular exercise. Continue Atorvastatin as prescribed  - Lipid panel  3. Essential hypertension,  benign Continue HTN  medication as prescribed. Continue regular BP checks at home. Continue low fat diet and regular exercise.   - CBC with Differential/Platelet - CMP14+EGFR  4. Atherosclerosis of native coronary artery of native heart without angina pectoris Notify cardiologist for intermittent CP. Take nitro as prescribed for CP unrelieved by rest. ED indications reviewed.    5. Gastroesophageal reflux disease without esophagitis Doing well on pantoprazole. Continue to eat low acidic foods, avoiding going to bed with a full stomach.   7. Benign prostatic hyperplasia with urinary hesitancy Continue flowmax.   8. Class 1 obesity due to excess calories with serious comorbidity and body mass index (BMI) of 34.0 to 34.9 in adult Continue low fat/low carb diet with regular exercise.    Labs pending Health Maintenance reviewed Diet and exercise encouraged  Follow up plan: 3 months.    [name], FNP- chart reviewed

## 2020-05-06 NOTE — Progress Notes (Signed)
Daily Session Note  Patient Details  Name: [name] [name] MRN: [id number] Date of Birth: 07-04-1955 Referring Provider:   Flowsheet Row CARDIAC REHAB PHASE II ORIENTATION from 04/11/2020 in [place]  Referring Provider Dr. Ellyn Hack      Encounter Date: 05/06/2020  Check In:  Session Check In - 05/06/20 0930      Check-In   Supervising physician immediately available to respond to emergencies [facility] MD immediately available    Physician(s) Dr. Harl Bowie    Location [facility]    Staff Present Cathren Harsh, MS, Exercise Physiologist;Dalton Kris Mouton, MS, ACSM-CEP, Exercise Physiologist    Virtual Visit No    Medication changes reported     No    Fall or balance concerns reported    No    Tobacco Cessation No Change    Warm-up and Cool-down Performed as group-led instruction    Resistance Training Performed Yes    VAD Patient? No    PAD/SET Patient? No      Pain Assessment   Currently in Pain? No/denies    Multiple Pain Sites No           Capillary Blood Glucose: No results found for this or any previous visit (from the past 24 hour(s)).    Social History   Tobacco Use  Smoking Status Never Smoker  Smokeless Tobacco Never Used    Goals Met:  Independence with exercise equipment Exercise tolerated well No report of cardiac concerns or symptoms Strength training completed today  Goals Unmet:  Not Applicable  Comments: check out 1030   Dr. Kathie Dike is Medical Director for [facility].

## 2020-05-06 NOTE — Progress Notes (Deleted)
Subjective:    Patient ID: [name] [name], male    DOB: [birth year], 65 y.o.   MRN: [id number]   Chief Complaint: Follow up visit for chronic disease management.    HPI:  1. Type 2 diabetes mellitus with other circulatory complication, with long-term current use of insulin (HCC) AM FSBG in the 90's. Denies sx of low BS. Low carb carb diet going well. Regular exercise with cardiac rehab.   Today's A1C is 7.1%  Lab Results  Component Value Date   HGBA1C 7.6 (H) 02/07/2020     2. Mixed hyperlipidemia  Low fat diet. Exercise regularly. Cardiologist is following/managing with Atorvastin. Denies side effects from statin.   Lab Results  Component Value Date   CHOL 89 02/08/2020   CHOL 109 01/31/2020   CHOL 97 (L) 10/24/2019   Lab Results  Component Value Date   HDL 16 (L) 02/08/2020   HDL 21 (L) 01/31/2020   HDL 18 (L) 10/24/2019   Lab Results  Component Value Date   LDLCALC 35 02/08/2020   LDLCALC 54 01/31/2020   LDLCALC 53 10/24/2019   Lab Results  Component Value Date   TRIG 188 (H) 02/08/2020   TRIG 209 (H) 01/31/2020   TRIG 145 10/24/2019   Lab Results  Component Value Date   CHOLHDL 5.6 02/08/2020   CHOLHDL 5.2 (H) 01/31/2020   CHOLHDL 5.4 (H) 10/24/2019   No results found for: LDLDIRECT   3. Essential hypertension, benign Doing well on lisinopril/hctz. Denies side effects. Tolerating well. Cardiac rehab monitoring BP's weekly running 120/60's.   BP Readings from Last 3 Encounters:  05/06/20 119/66  04/11/20 114/66  03/07/20 136/76     5. Atherosclerosis of native coronary artery of native heart without angina pectoris Managed by cardiology. Does have intermittent CP on exertion. On Imdur.    7. Gastroesophageal reflux disease without esophagitis Doing well on pantoprazole. Does not eat "heavy meals" or late at night.   8. Benign prostatic hyperplasia with urinary hesitancy Doing well on flowmax.   9. Class 1 obesity due to excess calories  with serious comorbidity and body mass index (BMI) of 34.0 to 34.9 in adult Participating cardiac rehab. Still has 9 more weeks remaining. Will continue low fat/low carb diet. Exercising regularly. Walks dog daily.   Wt Readings from Last 3 Encounters:  05/06/20 238 lb (108 kg)  05/06/20 237 lb 14 oz (107.9 kg)  04/22/20 238 lb 1.6 oz (108 kg)     Current Outpatient Medications on File Prior to Visit  Medication Sig Dispense Refill  . ACCU-CHEK AVIVA PLUS test strip USE TO CHECK BLOOD SUGAR UP TO 4 TIMES DAILY. 100 strip 10  . Accu-Chek Softclix Lancets lancets USE TO CHECK BLOOD SUGAR UP TO 4 TIMES DAILY. 100 each 0  . aspirin 81 MG tablet Take 81 mg by mouth daily.    Marland Kitchen atorvastatin (LIPITOR) 40 MG tablet Take 1 tablet (40 mg total) by mouth daily. 30 tablet 11  . blood glucose meter kit and supplies Dispense based on patient and insurance preference. Use up to four times daily as directed. (FOR ICD-10 E10.9, E11.9). 1 each 0  . carvedilol (COREG) 12.5 MG tablet Take 1 tablet (12.5 mg total) by mouth 2 (two) times daily with a meal. 180 tablet 1  . cetirizine (ZYRTEC) 10 MG tablet Take 10 mg by mouth daily.    . Cinnamon 500 MG TABS Take 1,000 mg by mouth 2 (two) times daily.     Marland Kitchen  CRANBERRY PO Take 4,200 mg by mouth 2 (two) times daily.     Marland Kitchen GLOBAL EASE INJECT PEN NEEDLES 31G X 8 MM MISC Use up to 8 times a day Dx E11.8 800 each 3  . Insulin Aspart FlexPen 100 UNIT/ML SOPN INJECT UP TO 45 UNITS EVERY DAY (Patient taking differently: Inject 45-75 Units into the skin 2 (two) times daily with a meal. INJECT 45-75 units subq with breakfast and supper. Sliding scale) 15 mL 11  . LANTUS SOLOSTAR 100 UNIT/ML Solostar Pen INJECT 44 UNITS EVERY MORNING AND 75 UNITS EVERY EVENING. 30 mL 0  . lisinopril-hydrochlorothiazide (ZESTORETIC) 20-12.5 MG tablet Take 1 tablet by mouth daily. 180 tablet 1  . metFORMIN (GLUCOPHAGE) 500 MG tablet TAKE 2 TABLETS BY MOUTH TWICE DAILY. 120 tablet 0  .  nitroGLYCERIN (NITROSTAT) 0.4 MG SL tablet Place 1 tablet (0.4 mg total) under the tongue every 5 (five) minutes as needed for chest pain. 25 tablet 3  . OZEMPIC, 0.25 OR 0.5 MG/DOSE, 2 MG/1.5ML SOPN INJECT 0.375 MLS (0.5 MG TOTAL) INTO THE SKIN ONCE A WEEK. 1.5 mL 0  . pantoprazole (PROTONIX) 40 MG tablet Take 1 tablet (40 mg total) by mouth daily as needed (for acid reflux). 90 tablet 1  . prasugrel (EFFIENT) 10 MG TABS tablet TAKE 1 TABLET ONCE DAILY. (Patient taking differently: Take 10 mg by mouth daily.) 90 tablet 1  . tamsulosin (FLOMAX) 0.4 MG CAPS capsule Take 1 capsule (0.4 mg total) by mouth at bedtime. 90 capsule 1  . isosorbide mononitrate (IMDUR) 30 MG 24 hr tablet Take 1 tablet (30 mg total) by mouth daily. 90 tablet 3   No current facility-administered medications on file prior to visit.     Past Surgical History:  Procedure Laterality Date  . CARDIAC CATHETERIZATION  2012  . CAROTID STENT     stents x 2   . CHOLECYSTECTOMY N/A 12/04/2015   Procedure: LAPAROSCOPIC CHOLECYSTECTOMY;  Surgeon: Aviva Signs, MD;  Location: [facility];  Service: General;  Laterality: N/A;  . CHOLECYSTECTOMY, LAPAROSCOPIC  12/05/2015  . CORONARY ANGIOPLASTY  2012   STENT X 1 2012, STENT X 1 YRS BEFORE  . CORONARY ATHERECTOMY N/A 02/09/2020   Procedure: CORONARY ATHERECTOMY;  Surgeon: Wellington Hampshire, MD;  Location: [facility];  Service: Cardiovascular;  Laterality: N/A;  . CORONARY STENT INTERVENTION N/A 02/09/2020   Procedure: CORONARY STENT INTERVENTION;  Surgeon: Wellington Hampshire, MD;  Location: [facility];  Service: Cardiovascular;  Laterality: N/A;  . HYDROCELE EXCISION Bilateral 06/02/2017   Procedure: HYDROCELECTOMY ADULT;  Surgeon: Ceasar Mons, MD;  Location: [facility];  Service: Urology;  Laterality: Bilateral;  ONLY NEEDS 45 MIN  . INGUINAL HERNIA REPAIR     RIGHT GROIN  . INTRAVASCULAR ULTRASOUND/IVUS N/A 02/09/2020   Procedure: Intravascular Ultrasound/IVUS;   Surgeon: Wellington Hampshire, MD;  Location: [facility];  Service: Cardiovascular;  Laterality: N/A;  . KNEE ARTHROSCOPY Left 05/23/2019   Procedure: LEFT KNEE ARTHROSCOPY AND DEBRIDEMENT PARTIAL MEDIAL MENISECTOMY;  Surgeon: Newt Minion, MD;  Location: [facility];  Service: Orthopedics;  Laterality: Left;  . LEFT HEART CATH AND CORONARY ANGIOGRAPHY N/A 02/07/2020   Procedure: LEFT HEART CATH AND CORONARY ANGIOGRAPHY;  Surgeon: Leonie Man, MD;  Location: [facility];  Service: Cardiovascular;  Laterality: N/A;  . TRIGGER FINGER RELEASE Right 01/08/2015   Procedure: RELEASE TRIGGER FINGER/A-1 PULLEY RIGHT MIDDLE FINGER;  Surgeon: Daryll Brod, MD;  Location:  [facility];  Service: Orthopedics;  Laterality: Right;    Family History  Problem Relation Age of Onset  . Hyperlipidemia Mother   . Hypertension Mother   . Hyperlipidemia Father   . Hypertension Father   . Diabetes Father   . Dementia Father   . Diabetes Maternal Grandfather     New complaints: None  Social history: Lives with wife and [place] husky.   Controlled substance contract:  N/A    Review of Systems  Constitutional: Negative for appetite change, fatigue and fever.  Respiratory: Negative for cough, shortness of breath and wheezing.   Cardiovascular: Negative for chest pain and palpitations.       CP on exertion.  Gastrointestinal: Negative for abdominal distention, abdominal pain, constipation, diarrhea and nausea.  Genitourinary: Negative for dysuria and frequency.  Musculoskeletal: Negative for back pain.  Neurological: Positive for dizziness. Negative for headaches.       Dizziness on positional changes.  Psychiatric/Behavioral: Positive for sleep disturbance. The patient is not nervous/anxious.        Some insomnia issues.        Objective:   Physical Exam Constitutional:      Appearance: Normal appearance.  HENT:     Head: Normocephalic.   Cardiovascular:     Rate and Rhythm: Normal rate and regular rhythm.     Pulses: Normal pulses.     Heart sounds: Normal heart sounds.  Pulmonary:     Effort: Pulmonary effort is normal.     Breath sounds: Normal breath sounds.  Abdominal:     General: Bowel sounds are normal.     Palpations: Abdomen is soft.  Musculoskeletal:        General: Normal range of motion.     Cervical back: Normal range of motion and neck supple.  Skin:    General: Skin is warm and dry.     Capillary Refill: Capillary refill takes less than 2 seconds.  Neurological:     Mental Status: He is alert and oriented to person, place, and time.  Psychiatric:        Mood and Affect: Mood normal.        Behavior: Behavior normal.        Thought Content: Thought content normal.        Judgment: Judgment normal.     Vitals:   05/06/20 1125  BP: 119/66  Pulse: 70  Resp: 20  Temp: (!) 97.5 F (36.4 C)  SpO2: 95%          Assessment & Plan:   [name] [name] comes in today with chief complaint of Medical Management of Chronic Issues   Diagnosis and orders addressed:  1. Type 2 diabetes mellitus with other circulatory complication, with long-term current use of insulin (HCC) Continue low carb/low sugar diet. Continue regular exercise. Continue current medication plan.   - Bayer DCA Hb A1c Waived - Microalbumin / creatinine urine ratio - metFORMIN (GLUCOPHAGE) 500 MG tablet; Take 2 tablets (1,000 mg total) by mouth 2 (two) times daily.  Dispense: 120 tablet; Refill: 1 - Semaglutide,0.25 or 0.5MG/DOS, (OZEMPIC, 0.25 OR 0.5 MG/DOSE,) 2 MG/1.5ML SOPN; Inject 2 mg into the skin once a week.  Dispense: 12 mL; Refill: 1  2. Mixed hyperlipidemia Managed by cardiology. Continue low fat diet, regular exercise. Continue Atorvastatin as prescribed  - Lipid panel  3. Essential hypertension, benign Continue HTN medication as prescribed. Continue regular BP checks at home. Continue low fat diet and regular  exercise.   - CBC with Differential/Platelet - CMP14+EGFR  4. Atherosclerosis of native coronary artery of native heart without angina pectoris Notify cardiologist for intermittent CP. Take nitro as prescribed for CP unrelieved by rest. ED indications reviewed.    5. Gastroesophageal reflux disease without esophagitis Doing well on pantoprazole. Continue to eat low acidic foods, avoiding going to bed with a full stomach.   7. Benign prostatic hyperplasia with urinary hesitancy Continue flowmax.   8. Class 1 obesity due to excess calories with serious comorbidity and body mass index (BMI) of 34.0 to 34.9 in adult Continue low fat/low carb diet with regular exercise.    Labs pending Health Maintenance reviewed Diet and exercise encouraged  Follow up plan: 3 months.    [name], FNP- chart reviewed

## 2020-05-06 NOTE — Patient Instructions (Signed)

## 2020-05-07 LAB — CMP14+EGFR
ALT: 33 IU/L (ref 0–44)
AST: 24 IU/L (ref 0–40)
Albumin/Globulin Ratio: 1.9 (ref 1.2–2.2)
Albumin: 4.5 g/dL (ref 3.8–4.8)
Alkaline Phosphatase: 59 IU/L (ref 44–121)
BUN/Creatinine Ratio: 18 (ref 10–24)
BUN: 19 mg/dL (ref 8–27)
Bilirubin Total: 0.6 mg/dL (ref 0.0–1.2)
CO2: 26 mmol/L (ref 20–29)
Calcium: 9.5 mg/dL (ref 8.6–10.2)
Chloride: 98 mmol/L (ref 96–106)
Creatinine, Ser: 1.07 mg/dL (ref 0.76–1.27)
GFR calc Af Amer: 83 mL/min/{1.73_m2} (ref >59)
GFR calc non Af Amer: 72 mL/min/{1.73_m2} (ref >59)
Globulin, Total: 2.4 g/dL (ref 1.5–4.5)
Glucose: 124 mg/dL — ABNORMAL HIGH (ref 65–99)
Potassium: 4.6 mmol/L (ref 3.5–5.2)
Sodium: 139 mmol/L (ref 134–144)
Total Protein: 6.9 g/dL (ref 6.0–8.5)

## 2020-05-07 LAB — CBC WITH DIFFERENTIAL/PLATELET
Basophils Absolute: 0 10*3/uL (ref 0.0–0.2)
Basos: 1 %
EOS (ABSOLUTE): 0.2 10*3/uL (ref 0.0–0.4)
Eos: 3 %
Hematocrit: 41.7 % (ref 37.5–51.0)
Hemoglobin: 13.9 g/dL (ref 13.0–17.7)
Immature Grans (Abs): 0 10*3/uL (ref 0.0–0.1)
Immature Granulocytes: 0 %
Lymphocytes Absolute: 2 10*3/uL (ref 0.7–3.1)
Lymphs: 33 %
MCH: 28.1 pg (ref 26.6–33.0)
MCHC: 33.3 g/dL (ref 31.5–35.7)
MCV: 84 fL (ref 79–97)
Monocytes Absolute: 0.7 10*3/uL (ref 0.1–0.9)
Monocytes: 12 %
Neutrophils Absolute: 3.2 10*3/uL (ref 1.4–7.0)
Neutrophils: 51 %
Platelets: 275 10*3/uL (ref 150–450)
RBC: 4.95 x10E6/uL (ref 4.14–5.80)
RDW: 13.8 % (ref 11.6–15.4)
WBC: 6.1 10*3/uL (ref 3.4–10.8)

## 2020-05-07 LAB — LIPID PANEL
Chol/HDL Ratio: 4.1 ratio (ref 0.0–5.0)
Cholesterol, Total: 95 mg/dL — ABNORMAL LOW (ref 100–199)
HDL: 23 mg/dL — ABNORMAL LOW (ref >39)
LDL Chol Calc (NIH): 47 mg/dL (ref 0–99)
Triglycerides: 145 mg/dL (ref 0–149)
VLDL Cholesterol Cal: 25 mg/dL (ref 5–40)

## 2020-05-08 ENCOUNTER — Telehealth: Payer: Self-pay | Admitting: Cardiology

## 2020-05-08 ENCOUNTER — Other Ambulatory Visit: Payer: Self-pay

## 2020-05-08 ENCOUNTER — Encounter (HOSPITAL_COMMUNITY)
Admission: RE | Admit: 2020-05-08 | Discharge: 2020-05-08 | Disposition: A | Discharge status: Home / Self Care | Payer: Medicare PPO | Source: Ambulatory Visit | Attending: Cardiology | Admitting: Cardiology

## 2020-05-08 DIAGNOSIS — Z955 Presence of coronary angioplasty implant and graft: Secondary | ICD-10-CM | POA: Diagnosis not present

## 2020-05-08 DIAGNOSIS — Z9889 Other specified postprocedural states: Secondary | ICD-10-CM

## 2020-05-08 NOTE — Progress Notes (Addendum)
Daily Session Note  Patient Details  Name: [name] [name] MRN: [id number] Date of Birth: 1956/02/09 Referring Provider:   Flowsheet Row CARDIAC REHAB PHASE II ORIENTATION from 04/11/2020 in [place]  Referring Provider Dr. Ellyn Hack      Encounter Date: 05/08/2020  Check In:  Session Check In - 05/08/20 0930      Check-In   Supervising physician immediately available to respond to emergencies [facility] MD immediately available    Physician(s) Dr. Harl Bowie    Location [facility]    Staff Present Cathren Harsh, MS, Exercise Physiologist;Dalton Kris Mouton, MS, ACSM-CEP, Exercise Physiologist    Virtual Visit No    Medication changes reported     No    Fall or balance concerns reported    No    Tobacco Cessation No Change    Warm-up and Cool-down Performed as group-led instruction    Resistance Training Performed Yes    VAD Patient? No    PAD/SET Patient? No      Pain Assessment   Currently in Pain? No/denies    Multiple Pain Sites No           Capillary Blood Glucose: No results found for this or any previous visit (from the past 24 hour(s)).    Social History   Tobacco Use  Smoking Status Never Smoker  Smokeless Tobacco Never Used    Goals Met:  Independence with exercise equipment Exercise tolerated well Strength training completed today  Goals Unmet:  Not Applicable  Comments: check out 1030   Dr. Kathie Dike is Medical Director for [facility].

## 2020-05-08 NOTE — Telephone Encounter (Signed)
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 He comes to them Mondays, Wednesdays and Fridays for Rehab. They had him today on the treadmill at 1.8 and had to slow it down due to chest pain. Patient advised him that since his procedure he has has had difficulty with stairs and inclines causing him chest pain. Kirk Ruths would like to know if Dr. Domenic Polite can see him sooner than his scheduled appointment on 06/04/20 in [place] and if he should come back for his next rehab appointment on Friday(05/10/20).

## 2020-05-08 NOTE — Telephone Encounter (Signed)
For now I would have him hold off on cardiac rehabilitation until we can see him, agree with setting up a visit sooner than March.  Please see my last note, there was discussion about possibly weaning him off Imdur.  Is he still on Imdur?  This may need to be resumed with describe angina symptoms.  Please get him scheduled with either me or APP in either office.

## 2020-05-08 NOTE — Progress Notes (Signed)
Cardiac Individual Treatment Plan  Patient Details  Name: [name] [name] MRN: [id number] Date of Birth: 29-Jun-1955 Referring Provider:   Flowsheet Row CARDIAC REHAB PHASE II ORIENTATION from 04/11/2020 in [place]  Referring Provider Dr. Ellyn Hack      Initial Encounter Date:  Flowsheet Row CARDIAC REHAB PHASE II ORIENTATION from 04/11/2020 in [place]  Date 04/11/20      Visit Diagnosis: S/P drug eluting coronary stent placement  History of atherectomy  Patient's Home Medications on Admission:  Current Outpatient Medications:  .  ACCU-CHEK AVIVA PLUS test strip, USE TO CHECK BLOOD SUGAR UP TO 4 TIMES DAILY., Disp: 100 strip, Rfl: 10 .  Accu-Chek Softclix Lancets lancets, USE TO CHECK BLOOD SUGAR UP TO 4 TIMES DAILY., Disp: 100 each, Rfl: 0 .  aspirin 81 MG tablet, Take 81 mg by mouth daily., Disp: , Rfl:  .  atorvastatin (LIPITOR) 40 MG tablet, Take 1 tablet (40 mg total) by mouth daily., Disp: 30 tablet, Rfl: 11 .  blood glucose meter kit and supplies, Dispense based on patient and insurance preference. Use up to four times daily as directed. (FOR ICD-10 E10.9, E11.9)., Disp: 1 each, Rfl: 0 .  carvedilol (COREG) 12.5 MG tablet, Take 1 tablet (12.5 mg total) by mouth 2 (two) times daily with a meal., Disp: 180 tablet, Rfl: 1 .  cetirizine (ZYRTEC) 10 MG tablet, Take 10 mg by mouth daily., Disp: , Rfl:  .  Cinnamon 500 MG TABS, Take 1,000 mg by mouth 2 (two) times daily. , Disp: , Rfl:  .  CRANBERRY PO, Take 4,200 mg by mouth 2 (two) times daily. , Disp: , Rfl:  .  GLOBAL EASE INJECT PEN NEEDLES 31G X 8 MM MISC, Use up to 8 times a day Dx E11.8, Disp: 800 each, Rfl: 3 .  Insulin Aspart FlexPen 100 UNIT/ML SOPN, INJECT UP TO 45 UNITS EVERY DAY (Patient taking differently: Inject 45-75 Units into the skin 2 (two) times daily with a meal. INJECT 45-75 units subq with breakfast and supper. Sliding scale), Disp: 15 mL, Rfl: 11 .  isosorbide  mononitrate (IMDUR) 30 MG 24 hr tablet, Take 1 tablet (30 mg total) by mouth daily., Disp: 90 tablet, Rfl: 3 .  LANTUS SOLOSTAR 100 UNIT/ML Solostar Pen, INJECT 44 UNITS EVERY MORNING AND 75 UNITS EVERY EVENING., Disp: 30 mL, Rfl: 0 .  lisinopril-hydrochlorothiazide (ZESTORETIC) 20-12.5 MG tablet, Take 1 tablet by mouth daily., Disp: 180 tablet, Rfl: 1 .  metFORMIN (GLUCOPHAGE) 500 MG tablet, Take 2 tablets (1,000 mg total) by mouth 2 (two) times daily., Disp: 120 tablet, Rfl: 1 .  nitroGLYCERIN (NITROSTAT) 0.4 MG SL tablet, Place 1 tablet (0.4 mg total) under the tongue every 5 (five) minutes as needed for chest pain., Disp: 25 tablet, Rfl: 3 .  pantoprazole (PROTONIX) 40 MG tablet, Take 1 tablet (40 mg total) by mouth daily as needed (for acid reflux)., Disp: 90 tablet, Rfl: 1 .  prasugrel (EFFIENT) 10 MG TABS tablet, TAKE 1 TABLET ONCE DAILY. (Patient taking differently: Take 10 mg by mouth daily.), Disp: 90 tablet, Rfl: 1 .  Semaglutide,0.25 or 0.5MG/DOS, (OZEMPIC, 0.25 OR 0.5 MG/DOSE,) 2 MG/1.5ML SOPN, Inject 2 mg into the skin once a week., Disp: 12 mL, Rfl: 1 .  tamsulosin (FLOMAX) 0.4 MG CAPS capsule, Take 1 capsule (0.4 mg total) by mouth at bedtime., Disp: 90 capsule, Rfl: 1  Past Medical History: Past Medical History:  Diagnosis Date  . Anxiety   .  Coronary atherosclerosis of native coronary artery    DES distal circumflex 2005; DES LAD/diagonal bifurcation 09/2010; DES ostial LAD and DES left PL 01/2020  . Essential hypertension   . GERD (gastroesophageal reflux disease)   . History of kidney stones   . Mixed hyperlipidemia   . Myocardial infarction ([place])    Anterolateral with VF arrest 7/12  . Type 2 diabetes mellitus ([facility])     Tobacco Use: Social History   Tobacco Use  Smoking Status Never Smoker  Smokeless Tobacco Never Used    Labs: Recent Review Flowsheet Data    Labs for ITP Cardiac and Pulmonary Rehab Latest Ref Rng & Units 10/24/2019 01/31/2020 02/07/2020  02/08/2020 05/06/2020   Cholestrol 100 - 199 mg/dL 97(L) 109 - 89 95(L)   LDLCALC 0 - 99 mg/dL 53 54 - 35 47   HDL >39 mg/dL 18(L) 21(L) - 16(L) 23(L)   Trlycerides 0 - 149 mg/dL 145 209(H) - 188(H) 145   Hemoglobin A1c <7.0 % 8.1(H) 7.6(H) 7.6(H) - 7.1(H)   TCO2 0 - 100 mmol/L - - - - -      Capillary Blood Glucose: Lab Results  Component Value Date   GLUCAP 130 (H) 04/11/2020   GLUCAP 226 (H) 02/10/2020   GLUCAP 214 (H) 02/10/2020   GLUCAP 203 (H) 02/10/2020   GLUCAP 176 (H) 02/09/2020    POCT Glucose    Row Name 04/11/20 1026             POCT Blood Glucose   Pre-Exercise 130 mg/dL              Exercise Target Goals: Exercise Program Goal: Individual exercise prescription set using results from initial 6 min walk test and THRR while considering  patient's activity barriers and safety.   Exercise Prescription Goal: Starting with aerobic activity 30 plus minutes a day, 3 days per week for initial exercise prescription. Provide home exercise prescription and guidelines that participant acknowledges understanding prior to discharge.  Activity Barriers & Risk Stratification:  Activity Barriers & Cardiac Risk Stratification - 04/11/20 0838      Activity Barriers & Cardiac Risk Stratification   Activity Barriers Joint Problems;Deconditioning;Chest Pain/Angina;Balance Concerns   knee surgery in left knee from torn meniscus   Cardiac Risk Stratification High           6 Minute Walk:  6 Minute Walk    Row Name 04/11/20 1011         6 Minute Walk   Phase Initial     Distance 1100 feet     Walk Time 6 minutes     # of Rest Breaks 0     MPH 2.52     METS 2.1     RPE 11     VO2 Peak 8.83     Symptoms No     Resting HR 67 bpm     Resting BP 114/66     Resting Oxygen Saturation  95 %     Exercise Oxygen Saturation  during 6 min walk 96 %     Max Ex. HR 99 bpm     Max Ex. BP 158/60     2 Minute Post BP 110/62            Oxygen Initial  Assessment:   Oxygen Re-Evaluation:   Oxygen Discharge (Final Oxygen Re-Evaluation):   Initial Exercise Prescription:  Initial Exercise Prescription - 04/11/20 1000      Date of Initial Exercise RX and  Referring Provider   Date 04/11/20    Referring Provider Dr. Ellyn Hack    Expected Discharge Date 07/05/20      NuStep   Level 1    SPM 80    Minutes 17      Arm Ergometer   Level 1    RPM 60    Minutes 22      Prescription Details   Duration Progress to 30 minutes of continuous aerobic without signs/symptoms of physical distress      Intensity   THRR 40-80% of Max Heartrate 62-123    Ratings of Perceived Exertion 11-15      Progression   Progression Continue progressive overload as per policy without signs/symptoms or physical distress.      Resistance Training   Training Prescription Yes    Weight 3    Reps 10-15           Perform Capillary Blood Glucose checks as needed.  Exercise Prescription Changes:   Exercise Prescription Changes    Row Name 04/22/20 1300 04/26/20 1000 05/06/20 1100         Response to Exercise   Blood Pressure (Admit) 108/68 - 130/68     Blood Pressure (Exercise) 144/76 - 128/76     Blood Pressure (Exit) 102/64 - 110/62     Heart Rate (Admit) 69 bpm - 80 bpm     Heart Rate (Exercise) 117 bpm - 105 bpm     Heart Rate (Exit) 87 bpm - 89 bpm     Rating of Perceived Exertion (Exercise) 13 - 13     Duration Continue with 30 min of aerobic exercise without signs/symptoms of physical distress. - Continue with 30 min of aerobic exercise without signs/symptoms of physical distress.     Intensity THRR unchanged - THRR unchanged           Progression   Progression Continue to progress workloads to maintain intensity without signs/symptoms of physical distress. - Continue to progress workloads to maintain intensity without signs/symptoms of physical distress.           Resistance Training   Training Prescription Yes - Yes     Weight 3  lbs - 4 lbs     Reps 10-15 - 10-15     Time 10 Minutes - 10 Minutes           NuStep   Level 1 - 3     SPM 106 - 105     Minutes 22 - 22     METs 2.1 - 2.1           Arm Ergometer   Level 1 - 1.5     RPM 63 - 67     Minutes 22 - 17     METs 2.1 - 2           Home Exercise Plan   Plans to continue exercise at - Home (comment) -     Frequency - Add 3 additional days to program exercise sessions. -     Initial Home Exercises Provided - 04/26/20 -            Exercise Comments:   Exercise Comments    Row Name 04/17/20 1030 04/26/20 1018         Exercise Comments Patient completed first exercise session today. He tolerated exercise well with no complaints. He had some minor knee issues during stretching. He expressed he really enjoyed the Nustep bike and is excited to  come back to rehab Reviewed home exercise program with patient. He is currently walking 30 minutes per day outside of rehab. Will start light stretching and resistance training.             Exercise Goals and Review:   Exercise Goals    Row Name 04/11/20 1014 05/06/20 1103           Exercise Goals   Increase Physical Activity Yes Yes      Intervention Provide advice, education, support and counseling about physical activity/exercise needs.;Develop an individualized exercise prescription for aerobic and resistive training based on initial evaluation findings, risk stratification, comorbidities and participant's personal goals. Provide advice, education, support and counseling about physical activity/exercise needs.;Develop an individualized exercise prescription for aerobic and resistive training based on initial evaluation findings, risk stratification, comorbidities and participant's personal goals.      Expected Outcomes Short Term: Attend rehab on a regular basis to increase amount of physical activity.;Long Term: Add in home exercise to make exercise part of routine and to increase amount of physical  activity.;Long Term: Exercising regularly at least 3-5 days a week. Short Term: Attend rehab on a regular basis to increase amount of physical activity.;Long Term: Add in home exercise to make exercise part of routine and to increase amount of physical activity.;Long Term: Exercising regularly at least 3-5 days a week.      Increase Strength and Stamina Yes Yes      Intervention Provide advice, education, support and counseling about physical activity/exercise needs.;Develop an individualized exercise prescription for aerobic and resistive training based on initial evaluation findings, risk stratification, comorbidities and participant's personal goals. Provide advice, education, support and counseling about physical activity/exercise needs.;Develop an individualized exercise prescription for aerobic and resistive training based on initial evaluation findings, risk stratification, comorbidities and participant's personal goals.      Expected Outcomes Short Term: Perform resistance training exercises routinely during rehab and add in resistance training at home;Short Term: Increase workloads from initial exercise prescription for resistance, speed, and METs.;Long Term: Improve cardiorespiratory fitness, muscular endurance and strength as measured by increased METs and functional capacity (6MWT) Short Term: Perform resistance training exercises routinely during rehab and add in resistance training at home;Short Term: Increase workloads from initial exercise prescription for resistance, speed, and METs.;Long Term: Improve cardiorespiratory fitness, muscular endurance and strength as measured by increased METs and functional capacity (6MWT)      Able to understand and use rate of perceived exertion (RPE) scale Yes Yes      Intervention Provide education and explanation on how to use RPE scale Provide education and explanation on how to use RPE scale      Expected Outcomes Short Term: Able to use RPE daily in rehab  to express subjective intensity level;Long Term:  Able to use RPE to guide intensity level when exercising independently Short Term: Able to use RPE daily in rehab to express subjective intensity level;Long Term:  Able to use RPE to guide intensity level when exercising independently      Knowledge and understanding of Target Heart Rate Range (THRR) Yes Yes      Intervention Provide education and explanation of THRR including how the numbers were predicted and where they are located for reference Provide education and explanation of THRR including how the numbers were predicted and where they are located for reference      Expected Outcomes Short Term: Able to use daily as guideline for intensity in rehab;Long Term: Able to use THRR  to govern intensity when exercising independently;Short Term: Able to state/look up THRR Short Term: Able to use daily as guideline for intensity in rehab;Long Term: Able to use THRR to govern intensity when exercising independently;Short Term: Able to state/look up THRR      Able to check pulse independently Yes Yes      Intervention Provide education and demonstration on how to check pulse in carotid and radial arteries.;Review the importance of being able to check your own pulse for safety during independent exercise Provide education and demonstration on how to check pulse in carotid and radial arteries.;Review the importance of being able to check your own pulse for safety during independent exercise      Expected Outcomes Short Term: Able to explain why pulse checking is important during independent exercise;Long Term: Able to check pulse independently and accurately Short Term: Able to explain why pulse checking is important during independent exercise;Long Term: Able to check pulse independently and accurately      Understanding of Exercise Prescription Yes Yes      Intervention Provide education, explanation, and written materials on patient's individual exercise  prescription Provide education, explanation, and written materials on patient's individual exercise prescription      Expected Outcomes Short Term: Able to explain program exercise prescription;Long Term: Able to explain home exercise prescription to exercise independently Short Term: Able to explain program exercise prescription;Long Term: Able to explain home exercise prescription to exercise independently             Exercise Goals Re-Evaluation :  Exercise Goals Re-Evaluation    [place] Name 05/06/20 1104             Exercise Goal Re-Evaluation   Exercise Goals Review Increase Physical Activity;Increase Strength and Stamina;Able to understand and use rate of perceived exertion (RPE) scale;Knowledge and understanding of Target Heart Rate Range (THRR);Able to check pulse independently;Understanding of Exercise Prescription       Comments Pt has attended 8 exercise sessions. He maintains a positive attitude and is accepting of workload increases. He currently exercises at 2.1 METs on the stepper. Will continue to monitor and progress as able.       Expected Outcomes Through exercise at rehab and by engaging in a home exercise program the patient will reach their goals.               Discharge Exercise Prescription (Final Exercise Prescription Changes):  Exercise Prescription Changes - 05/06/20 1100      Response to Exercise   Blood Pressure (Admit) 130/68    Blood Pressure (Exercise) 128/76    Blood Pressure (Exit) 110/62    Heart Rate (Admit) 80 bpm    Heart Rate (Exercise) 105 bpm    Heart Rate (Exit) 89 bpm    Rating of Perceived Exertion (Exercise) 13    Duration Continue with 30 min of aerobic exercise without signs/symptoms of physical distress.    Intensity THRR unchanged      Progression   Progression Continue to progress workloads to maintain intensity without signs/symptoms of physical distress.      Resistance Training   Training Prescription Yes    Weight 4 lbs     Reps 10-15    Time 10 Minutes      NuStep   Level 3    SPM 105    Minutes 22    METs 2.1      Arm Ergometer   Level 1.5    RPM 67  Minutes 17    METs 2           Nutrition:  Target Goals: Understanding of nutrition guidelines, daily intake of sodium <1574m, cholesterol <2080m calories 30% from fat and 7% or less from saturated fats, daily to have 5 or more servings of fruits and vegetables.  Biometrics:  Pre Biometrics - 04/22/20 1333      Pre Biometrics   Weight 108 kg    BMI (Calculated) 35.67            Nutrition Therapy Plan and Nutrition Goals:  Nutrition Therapy & Goals - 04/29/20 1253      Personal Nutrition Goals   Comments Patient scored 30 on his diet assessment. He says he is following a low fat, low carbohydrate, sugar diet. We will continue to provide heart healthy nutritional education through handouts.      Intervention Plan   Intervention Nutrition handout(s) given to patient.           Nutrition Assessments:  Nutrition Assessments - 04/11/20 0843      MEDFICTS Scores   Pre Score 30          MEDIFICTS Score Key:  ?70 Need to make dietary changes   40-70 Heart Healthy Diet  ? 40 Therapeutic Level Cholesterol Diet   Picture Your Plate Scores:  <4<21nhealthy dietary pattern with much room for improvement.  41-50 Dietary pattern unlikely to meet recommendations for good health and room for improvement.  51-60 More healthful dietary pattern, with some room for improvement.   >60 Healthy dietary pattern, although there may be some specific behaviors that could be improved.    Nutrition Goals Re-Evaluation:   Nutrition Goals Discharge (Final Nutrition Goals Re-Evaluation):   Psychosocial: Target Goals: Acknowledge presence or absence of significant depression and/or stress, maximize coping skills, provide positive support system. Participant is able to verbalize types and ability to use techniques and skills needed for  reducing stress and depression.  Initial Review & Psychosocial Screening:  Initial Psych Review & Screening - 04/11/20 0833      Initial Review   Current issues with Current Sleep Concerns;Current Stress Concerns    Source of Stress Concerns Family    Comments His father is in a nursing home with dementia. His sister's husband died a few years ago and then her son was killed in a car accident.      Family Dynamics   Good Support System? Yes    Comments His family is there for them but he states that they tend to laugh things off and do not seriously talk about their issues. He would like more serious support at times.      Barriers   Psychosocial barriers to participate in program The patient should benefit from training in stress management and relaxation.      Screening Interventions   Interventions Encouraged to exercise;To provide support and resources with identified psychosocial needs    Expected Outcomes Short Term goal: Utilizing psychosocial counselor, staff and physician to assist with identification of specific Stressors or current issues interfering with healing process. Setting desired goal for each stressor or current issue identified.;Long Term Goal: Stressors or current issues are controlled or eliminated.;Short Term goal: Identification and review with participant of any Quality of Life or Depression concerns found by scoring the questionnaire.;Long Term goal: The participant improves quality of Life and PHQ9 Scores as seen by post scores and/or verbalization of changes  Quality of Life Scores:  Quality of Life - 04/11/20 1020      Quality of Life   Select Quality of Life          Scores of 19 and below usually indicate a poorer quality of life in these areas.  A difference of  2-3 points is a clinically meaningful difference.  A difference of 2-3 points in the total score of the Quality of Life Index has been associated with significant improvement in  overall quality of life, self-image, physical symptoms, and general health in studies assessing change in quality of life.  PHQ-9: Recent Review Flowsheet Data    Depression screen [facility] 2/9 05/06/2020 04/11/2020 02/13/2020 01/31/2020 10/24/2019   Decreased Interest 0 1 0 0 0   Down, Depressed, Hopeless 0 1 0 0 0   PHQ - 2 Score 0 2 0 0 0   Altered sleeping - 3  - - -   Tired, decreased energy - 2 - - -   Change in appetite - 0 - - -   Feeling bad or failure about yourself  - 1 - - -   Trouble concentrating - 0 - - -   Moving slowly or fidgety/restless - 0 - - -   Suicidal thoughts - 0 - - -   PHQ-9 Score - 8 - - -   Difficult doing work/chores - Somewhat difficult - - -     Interpretation of Total Score  Total Score Depression Severity:  1-4 = Minimal depression, 5-9 = Mild depression, 10-14 = Moderate depression, 15-19 = Moderately severe depression, 20-27 = Severe depression   Psychosocial Evaluation and Intervention:  Psychosocial Evaluation - 04/11/20 1008      Psychosocial Evaluation & Interventions   Interventions Encouraged to exercise with the program and follow exercise prescription;Stress management education;Relaxation education    Comments Pt scored an 8 on his PHQ-9, and most of the score was attributed to his poor sleep patterns. He reports that he does not fall asleep until 3 or 4 in the morning. Other than his sleep concerns, he has no identifiable psychosocial concerns.    Expected Outcomes Through the patient participating in cardiac rehab and becoming more active, his sleeping patterns will improve.    Continue Psychosocial Services  No Follow up required           Psychosocial Re-Evaluation:  Psychosocial Re-Evaluation    [place] Name 04/29/20 1249             Psychosocial Re-Evaluation   Current issues with Current Sleep Concerns       Comments Patient is new to the program completing 6 sessions. He does have issues with sleeps which he uses Melatonin with  some relief. He demonstrates a very positive attitude always smiling. He is interactive with staff and others in his class. Will continue to monitor.       Expected Outcomes Patient's sleep concerns will be managed with no other psychosocial issues identified at discharge.       Interventions Encouraged to attend Cardiac Rehabilitation for the exercise;Stress management education;Relaxation education       Continue Psychosocial Services  No Follow up required              Psychosocial Discharge (Final Psychosocial Re-Evaluation):  Psychosocial Re-Evaluation - 04/29/20 1249      Psychosocial Re-Evaluation   Current issues with Current Sleep Concerns    Comments Patient is new to the program completing 6 sessions. He  does have issues with sleeps which he uses Melatonin with some relief. He demonstrates a very positive attitude always smiling. He is interactive with staff and others in his class. Will continue to monitor.    Expected Outcomes Patient's sleep concerns will be managed with no other psychosocial issues identified at discharge.    Interventions Encouraged to attend Cardiac Rehabilitation for the exercise;Stress management education;Relaxation education    Continue Psychosocial Services  No Follow up required           Vocational Rehabilitation: Provide vocational rehab assistance to qualifying candidates.   Vocational Rehab Evaluation & Intervention:  Vocational Rehab - 04/11/20 1751      Initial Vocational Rehab Evaluation & Intervention   Assessment shows need for Vocational Rehabilitation No      Vocational Rehab Re-Evaulation   Comments Patient is retired and has no desire to return back to a job with any significant physical requirements.           Education: Education Goals: Education classes will be provided on a weekly basis, covering required topics. Participant will state understanding/return demonstration of topics presented.  Learning  Barriers/Preferences:  Learning Barriers/Preferences - 04/11/20 0836      Learning Barriers/Preferences   Learning Barriers None    Learning Preferences Audio;Verbal Instruction           Education Topics: Hypertension, Hypertension Reduction -Define heart disease and high blood pressure. Discus how high blood pressure affects the body and ways to reduce high blood pressure.   Exercise and Your Heart -Discuss why it is important to exercise, the FITT principles of exercise, normal and abnormal responses to exercise, and how to exercise safely.   Angina -Discuss definition of angina, causes of angina, treatment of angina, and how to decrease risk of having angina. Flowsheet Row CARDIAC REHAB PHASE II EXERCISE from 04/24/2020 in [place]  Date 04/17/20  Educator [facility]  Instruction Review Code 1- Verbalizes Understanding      Cardiac Medications -Review what the following cardiac medications are used for, how they affect the body, and side effects that may occur when taking the medications.  Medications include Aspirin, Beta blockers, calcium channel blockers, ACE Inhibitors, angiotensin receptor blockers, diuretics, digoxin, and antihyperlipidemics. Flowsheet Row CARDIAC REHAB PHASE II EXERCISE from 04/24/2020 in [place]  Date 04/24/20  Educator [facility]  Instruction Review Code 2- Demonstrated Understanding      Congestive Heart Failure -Discuss the definition of CHF, how to live with CHF, the signs and symptoms of CHF, and how keep track of weight and sodium intake.   Heart Disease and Intimacy -Discus the effect sexual activity has on the heart, how changes occur during intimacy as we age, and safety during sexual activity.   Smoking Cessation / COPD -Discuss different methods to quit smoking, the health benefits of quitting smoking, and the definition of COPD.   Nutrition I: Fats -Discuss the types of cholesterol, what  cholesterol does to the heart, and how cholesterol levels can be controlled.   Nutrition II: Labels -Discuss the different components of food labels and how to read food label   Heart Parts/Heart Disease and PAD -Discuss the anatomy of the heart, the pathway of blood circulation through the heart, and these are affected by heart disease.   Stress I: Signs and Symptoms -Discuss the causes of stress, how stress may lead to anxiety and depression, and ways to limit stress.   Stress II: Relaxation -Discuss different types of  relaxation techniques to limit stress.   Warning Signs of Stroke / TIA -Discuss definition of a stroke, what the signs and symptoms are of a stroke, and how to identify when someone is having stroke.   Knowledge Questionnaire Score:  Knowledge Questionnaire Score - 04/11/20 0841      Knowledge Questionnaire Score   Pre Score 24/24           Core Components/Risk Factors/Patient Goals at Admission:  Personal Goals and Risk Factors at Admission - 04/11/20 0837      Core Components/Risk Factors/Patient Goals on Admission    Weight Management Yes    Intervention Weight Management: Develop a combined nutrition and exercise program designed to reach desired caloric intake, while maintaining appropriate intake of nutrient and fiber, sodium and fats, and appropriate energy expenditure required for the weight goal.;Weight Management: Provide education and appropriate resources to help participant work on and attain dietary goals.;Weight Management/Obesity: Establish reasonable short term and long term weight goals.;Obesity: Provide education and appropriate resources to help participant work on and attain dietary goals.    Expected Outcomes Short Term: Continue to assess and modify interventions until short term weight is achieved;Long Term: Adherence to nutrition and physical activity/exercise program aimed toward attainment of established weight goal;Weight  Maintenance: Understanding of the daily nutrition guidelines, which includes 25-35% calories from fat, 7% or less cal from saturated fats, less than 267m cholesterol, less than 1.5gm of sodium, & 5 or more servings of fruits and vegetables daily;Weight Loss: Understanding of general recommendations for a balanced deficit meal plan, which promotes 1-2 lb weight loss per week and includes a negative energy balance of [phone] kcal/d;Understanding recommendations for meals to include 15-35% energy as protein, 25-35% energy from fat, 35-60% energy from carbohydrates, less than 2079mof dietary cholesterol, 20-35 gm of total fiber daily;Understanding of distribution of calorie intake throughout the day with the consumption of 4-5 meals/snacks;Weight Gain: Understanding of general recommendations for a high calorie, high protein meal plan that promotes weight gain by distributing calorie intake throughout the day with the consumption for 4-5 meals, snacks, and/or supplements    Improve shortness of breath with ADL's Yes    Intervention Provide education, individualized exercise plan and daily activity instruction to help decrease symptoms of SOB with activities of daily living.    Expected Outcomes Short Term: Improve cardiorespiratory fitness to achieve a reduction of symptoms when performing ADLs;Long Term: Be able to perform more ADLs without symptoms or delay the onset of symptoms    Diabetes Yes    Intervention Provide education about signs/symptoms and action to take for hypo/hyperglycemia.;Provide education about proper nutrition, including hydration, and aerobic/resistive exercise prescription along with prescribed medications to achieve blood glucose in normal ranges: Fasting glucose 65-99 mg/dL    Expected Outcomes Short Term: Participant verbalizes understanding of the signs/symptoms and immediate care of hyper/hypoglycemia, proper foot care and importance of medication, aerobic/resistive exercise and  nutrition plan for blood glucose control.;Long Term: Attainment of HbA1C < 7%.    Stress Yes    Intervention Offer individual and/or small group education and counseling on adjustment to heart disease, stress management and health-related lifestyle change. Teach and support self-help strategies.;Refer participants experiencing significant psychosocial distress to appropriate mental health specialists for further evaluation and treatment. When possible, include family members and significant others in education/counseling sessions.    Expected Outcomes Short Term: Participant demonstrates changes in health-related behavior, relaxation and other stress management skills, ability to obtain effective social support, and compliance  with psychotropic medications if prescribed.;Long Term: Emotional wellbeing is indicated by absence of clinically significant psychosocial distress or social isolation.           Core Components/Risk Factors/Patient Goals Review:   Goals and Risk Factor Review    Row Name 04/29/20 1255             Core Components/Risk Factors/Patient Goals Review   Personal Goals Review Weight Management/Obesity;Other;Diabetes       Review Patient is new to the program completing 6 sessions gaining 2.7 lbs since his intial visit. He has multiple risk factors for CAD and was referred to cardiac rehab s/p stent placement and Atherectomy. He is participating in the program for risk modification. His personal goals are to lose weight and get stronger. His last A1C was 7.6% 02/07/20 managed with Metformin and both short and long acting insulin. We will continue to monitor his progress as he works toward these goals.       Expected Outcomes Patient will complete the program meeting both personal and program goals.              Core Components/Risk Factors/Patient Goals at Discharge (Final Review):   Goals and Risk Factor Review - 04/29/20 1255      Core Components/Risk Factors/Patient  Goals Review   Personal Goals Review Weight Management/Obesity;Other;Diabetes    Review Patient is new to the program completing 6 sessions gaining 2.7 lbs since his intial visit. He has multiple risk factors for CAD and was referred to cardiac rehab s/p stent placement and Atherectomy. He is participating in the program for risk modification. His personal goals are to lose weight and get stronger. His last A1C was 7.6% 02/07/20 managed with Metformin and both short and long acting insulin. We will continue to monitor his progress as he works toward these goals.    Expected Outcomes Patient will complete the program meeting both personal and program goals.           ITP Comments:   Comments: ITP REVIEW Pt is making expected progress toward Cardiac Rehab goals after completing 9 sessions. Recommend continued exercise, life style modification, education, and increased stamina and strength.

## 2020-05-08 NOTE — Telephone Encounter (Signed)
Will fwd to provider for advice.

## 2020-05-09 NOTE — Telephone Encounter (Signed)
Patient notified - OV scheduled for Tuesday, 05/21/20 at 8:30 am with Katina Dung, NP in [place] office.   Patient states that he is still taking the Imdur currently.

## 2020-05-10 ENCOUNTER — Encounter (HOSPITAL_COMMUNITY): Payer: Medicare PPO

## 2020-05-13 ENCOUNTER — Encounter (HOSPITAL_COMMUNITY): Payer: Medicare PPO

## 2020-05-14 ENCOUNTER — Other Ambulatory Visit: Payer: Self-pay | Admitting: Nurse Practitioner

## 2020-05-14 DIAGNOSIS — Z794 Long term (current) use of insulin: Secondary | ICD-10-CM

## 2020-05-14 DIAGNOSIS — E1159 Type 2 diabetes mellitus with other circulatory complications: Secondary | ICD-10-CM

## 2020-05-15 ENCOUNTER — Encounter (HOSPITAL_COMMUNITY): Payer: Medicare PPO

## 2020-05-16 ENCOUNTER — Other Ambulatory Visit: Payer: Self-pay | Admitting: Cardiology

## 2020-05-16 ENCOUNTER — Other Ambulatory Visit: Payer: Self-pay

## 2020-05-16 ENCOUNTER — Ambulatory Visit: Payer: Medicare PPO | Admitting: Cardiology

## 2020-05-16 ENCOUNTER — Encounter: Payer: Self-pay | Admitting: Cardiology

## 2020-05-16 VITALS — BP 134/72 | HR 88 | Ht 68.0 in | Wt 238.0 lb

## 2020-05-16 DIAGNOSIS — I200 Unstable angina: Secondary | ICD-10-CM | POA: Diagnosis not present

## 2020-05-16 DIAGNOSIS — I25119 Atherosclerotic heart disease of native coronary artery with unspecified angina pectoris: Secondary | ICD-10-CM | POA: Diagnosis not present

## 2020-05-16 DIAGNOSIS — E782 Mixed hyperlipidemia: Secondary | ICD-10-CM

## 2020-05-16 MED ORDER — NITROGLYCERIN 0.4 MG SL SUBL: 0.4000 mg | SUBLINGUAL_TABLET | SUBLINGUAL | 3 refills | Status: DC | PRN

## 2020-05-16 MED ORDER — CARVEDILOL 12.5 MG PO TABS: 18.7500 mg | ORAL_TABLET | Freq: Two times a day (BID) | ORAL | 3 refills | Status: DC

## 2020-05-16 MED ORDER — SODIUM CHLORIDE 0.9% FLUSH: 3.0000 mL | Freq: Two times a day (BID) | INTRAVENOUS | Status: DC

## 2020-05-16 NOTE — Progress Notes (Signed)
  Cardiology Office Note  Date: 05/16/2020   ID: [name], DOB 03/06/1956, MRN [id number]  PCP:  Martin, Mary-Margaret, FNP  Cardiologist:  Alyx Mcguirk, MD Electrophysiologist:  None   Chief Complaint  Patient presents with  . Recurrent angina    History of Present Illness: [name] [name] is a 65 y.o. male last seen in December 2021.  He presents to the office for follow-up, had been participating in cardiac rehabilitation which was put on pause in early February due to recurring angina symptoms on the treadmill.  He underwent complex PCI in November 2021 as noted below, had been doing reasonably well on medical therapy, states that he has been compliant with medications.  Treadmill mainly seemed to bring on symptoms in cardiac rehab, but he has still had recurring angina with other activity, walking in from the parking lot for instance this morning.  He feels that symptoms are similar to pre-intervention late last year.  I reviewed his medications which are outlined below.  We discussed uptitrating Coreg.  We also discussed relook angiography to reevaluate coronary anatomy given somewhat abrupt change in symptoms on solid medical regimen.  Past Medical History:  Diagnosis Date  . Anxiety   . Coronary atherosclerosis of native coronary artery    DES distal circumflex 2005; DES LAD/diagonal bifurcation 09/2010; DES ostial LAD and DES left PL 01/2020  . Essential hypertension   . GERD (gastroesophageal reflux disease)   . History of kidney stones   . Mixed hyperlipidemia   . Myocardial infarction (HCC)    Anterolateral with VF arrest 7/12  . Type 2 diabetes mellitus (HCC)     Past Surgical History:  Procedure Laterality Date  . CARDIAC CATHETERIZATION  2012  . CAROTID STENT     stents x 2   . CHOLECYSTECTOMY N/A 12/04/2015   Procedure: LAPAROSCOPIC CHOLECYSTECTOMY;  Surgeon: Mark Jenkins, MD;  Location: [facility];  Service: General;  Laterality: N/A;  .  CHOLECYSTECTOMY, LAPAROSCOPIC  12/05/2015  . CORONARY ANGIOPLASTY  2012   STENT X 1 2012, STENT X 1 YRS BEFORE  . CORONARY ATHERECTOMY N/A 02/09/2020   Procedure: CORONARY ATHERECTOMY;  Surgeon: Arida, Muhammad A, MD;  Location: [facility];  Service: Cardiovascular;  Laterality: N/A;  . CORONARY STENT INTERVENTION N/A 02/09/2020   Procedure: CORONARY STENT INTERVENTION;  Surgeon: Arida, Muhammad A, MD;  Location: [facility];  Service: Cardiovascular;  Laterality: N/A;  . HYDROCELE EXCISION Bilateral 06/02/2017   Procedure: HYDROCELECTOMY ADULT;  Surgeon: Winter, Christopher Aaron, MD;  Location: [facility];  Service: Urology;  Laterality: Bilateral;  ONLY NEEDS 45 MIN  . INGUINAL HERNIA REPAIR     RIGHT GROIN  . INTRAVASCULAR ULTRASOUND/IVUS N/A 02/09/2020   Procedure: Intravascular Ultrasound/IVUS;  Surgeon: Arida, Muhammad A, MD;  Location: [facility];  Service: Cardiovascular;  Laterality: N/A;  . KNEE ARTHROSCOPY Left 05/23/2019   Procedure: LEFT KNEE ARTHROSCOPY AND DEBRIDEMENT PARTIAL MEDIAL MENISECTOMY;  Surgeon: Duda, Marcus V, MD;  Location: [facility];  Service: Orthopedics;  Laterality: Left;  . LEFT HEART CATH AND CORONARY ANGIOGRAPHY N/A 02/07/2020   Procedure: LEFT HEART CATH AND CORONARY ANGIOGRAPHY;  Surgeon: Harding, Schylar W, MD;  Location: [facility];  Service: Cardiovascular;  Laterality: N/A;  . TRIGGER FINGER RELEASE Right 01/08/2015   Procedure: RELEASE TRIGGER FINGER/A-1 PULLEY RIGHT MIDDLE FINGER;  Surgeon: Gary Kuzma, MD;  Location: [facility];  Service: Orthopedics;  Laterality: Right;      Current Outpatient Medications  Medication Sig Dispense Refill  . ACCU-CHEK AVIVA PLUS test strip USE TO CHECK BLOOD SUGAR UP TO 4 TIMES DAILY. 100 strip 10  . Accu-Chek Softclix Lancets lancets USE TO CHECK BLOOD SUGAR UP TO 4 TIMES DAILY. 100 each 0  . aspirin 81 MG tablet Take 81 mg by mouth daily.    Marland Kitchen atorvastatin  (LIPITOR) 40 MG tablet Take 1 tablet (40 mg total) by mouth daily. 30 tablet 11  . blood glucose meter kit and supplies Dispense based on patient and insurance preference. Use up to four times daily as directed. (FOR ICD-10 E10.9, E11.9). 1 each 0  . carvedilol (COREG) 12.5 MG tablet Take 1.5 tablets (18.75 mg total) by mouth 2 (two) times daily. 270 tablet 3  . cetirizine (ZYRTEC) 10 MG tablet Take 10 mg by mouth daily.    . Cinnamon 500 MG TABS Take 1,000 mg by mouth 2 (two) times daily.     Marland Kitchen CRANBERRY PO Take 4,200 mg by mouth 2 (two) times daily.     Marland Kitchen GLOBAL EASE INJECT PEN NEEDLES 31G X 8 MM MISC Use up to 8 times a day Dx E11.8 800 each 3  . Insulin Aspart FlexPen 100 UNIT/ML SOPN INJECT UP TO 45 UNITS EVERY DAY (Patient taking differently: Inject 45-75 Units into the skin 2 (two) times daily with a meal. INJECT 45-75 units subq with breakfast and supper. Sliding scale) 15 mL 11  . isosorbide mononitrate (IMDUR) 30 MG 24 hr tablet Take 30 mg by mouth daily.    Marland Kitchen LANTUS SOLOSTAR 100 UNIT/ML Solostar Pen INJECT 44 UNITS EVERY MORNING AND 75 UNITS EVERY EVENING. 30 mL 0  . lisinopril-hydrochlorothiazide (ZESTORETIC) 20-12.5 MG tablet Take 1 tablet by mouth daily. 180 tablet 1  . metFORMIN (GLUCOPHAGE) 500 MG tablet Take 2 tablets (1,000 mg total) by mouth 2 (two) times daily. 120 tablet 1  . pantoprazole (PROTONIX) 40 MG tablet Take 1 tablet (40 mg total) by mouth daily as needed (for acid reflux). 90 tablet 1  . prasugrel (EFFIENT) 10 MG TABS tablet TAKE 1 TABLET ONCE DAILY. (Patient taking differently: Take 10 mg by mouth daily.) 90 tablet 1  . Semaglutide,0.25 or 0.5MG/DOS, (OZEMPIC, 0.25 OR 0.5 MG/DOSE,) 2 MG/1.5ML SOPN Inject 2 mg into the skin once a week. 12 mL 1  . tamsulosin (FLOMAX) 0.4 MG CAPS capsule Take 1 capsule (0.4 mg total) by mouth at bedtime. 90 capsule 1  . nitroGLYCERIN (NITROSTAT) 0.4 MG SL tablet Place 1 tablet (0.4 mg total) under the tongue every 5 (five) minutes as  needed for chest pain. 25 tablet 3   No current facility-administered medications for this visit.   Allergies:  Patient has no known allergies.   Social History: The patient  reports that he has never smoked. He has never used smokeless tobacco. He reports that he does not drink alcohol and does not use drugs.   Family History: The patient's family history includes Dementia in his father; Diabetes in his father and maternal grandfather; Hyperlipidemia in his father and mother; Hypertension in his father and mother.   ROS: No palpitations or syncope.  Physical Exam: VS:  BP 134/72   Pulse 88   Ht 5' 8" (1.727 m)   Wt 238 lb (108 kg)   SpO2 95%   BMI 36.19 kg/m , BMI Body mass index is 36.19 kg/m.  Wt Readings from Last 3 Encounters:  05/16/20 238 lb (108 kg)  05/06/20 237 lb  14 oz (107.9 kg)  05/06/20 238 lb (108 kg)    General: Patient appears comfortable at rest. HEENT: Conjunctiva and lids normal, wearing a mask. Neck: Supple, no elevated JVP or carotid bruits, no thyromegaly. Lungs: Clear to auscultation, nonlabored breathing at rest. Cardiac: Regular rate and rhythm, no S3 or significant systolic murmur, no pericardial rub. Abdomen: Soft, bowel sounds present. Extremities: No pitting edema. Skin: Warm and dry. Musculoskeletal: No kyphosis. Neuropsychiatric: Alert and oriented x3, affect grossly appropriate.  ECG:  An ECG dated 02/09/2020 was personally reviewed today and demonstrated:  Normal sinus rhythm.  Recent Labwork: 05/06/2020: ALT 33; AST 24; BUN 19; Creatinine, Ser 1.07; Hemoglobin 13.9; Platelets 275; Potassium 4.6; Sodium 139     Component Value Date/Time   CHOL 95 (L) 05/06/2020 1125   TRIG 145 05/06/2020 1125   HDL 23 (L) 05/06/2020 1125   CHOLHDL 4.1 05/06/2020 1125   CHOLHDL 5.6 02/08/2020 0626   VLDL 38 02/08/2020 0626   LDLCALC 47 05/06/2020 1125    Other Studies Reviewed Today:  Echocardiogram 02/08/2020: 1. Left ventricular ejection  fraction, by estimation, is 60 to 65%. The  left ventricle has normal function. The left ventricle has no regional  wall motion abnormalities. Left ventricular diastolic parameters are  consistent with Grade I diastolic  dysfunction (impaired relaxation).  2. Right ventricular systolic function is normal. The right ventricular  size is normal. Tricuspid regurgitation signal is inadequate for assessing  PA pressure.  3. The mitral valve is normal in structure. No evidence of mitral valve  regurgitation. No evidence of mitral stenosis.  4. The aortic valve is tricuspid. Aortic valve regurgitation is not  visualized. Mild aortic valve sclerosis is present, with no evidence of  aortic valve stenosis.  5. Aortic dilatation noted. There is mild dilatation of the ascending  aorta, measuring 40 mm.   PCI 02/09/2020:  Prox RCA lesion is 70% stenosed.  Dist LM to Prox LAD lesion is 95% stenosed.  Dist LAD lesion is 60% stenosed.  Mid LAD lesion is 20% stenosed.  Prox LAD to Mid LAD lesion is 20% stenosed.  1st Mrg lesion is 85% stenosed.  1st LPL-1 lesion is 95% stenosed.  1st LPL-2 lesion is 80% stenosed.  LPAV-1 lesion is 30% stenosed.  LPAV-2 lesion is 100% stenosed.  Post intervention, there is a 0% residual stenosis.  A drug-eluting stent was successfully placed using a SYNERGY XD 3.50X12.  Post intervention, there is a 0% residual stenosis.  A drug-eluting stent was successfully placed using a SYNERGY XD 2.25X16.   1. Successful IVUS guided orbital atherectomy and drug eluting stent placement to the ostial LAD. 2.  Successful angioplasty and drug-eluting stent placement to first PL branch of the left circumflex.  Assessment and Plan:  1.  Accelerating angina on stable medical therapy.  Patient has multivessel CAD status post DES to the circumflex in 2005, DES to the LAD/diagonal in 2012, and more recently orbital atherectomy with DES to the ostial LAD and DES to the  first PL branch of the circumflex in November 2021.  He is experiencing recurrent angina similar to preintervention symptoms, cardiac rehabilitation put on pause.  We discussed risks and benefits of a relook diagnostic cardiac catheterization, he agrees to proceed.  Continue aspirin, Effient, Lipitor, Zestoretic, Imdur and increase Coreg to 18.75 mg twice daily.  2.  Mixed hyperlipidemia, doing well on Lipitor.  Last LDL 47.  3.  Type 2 diabetes mellitus, on Glucophage, Ozempic, and Lantus.  Medication  Adjustments/Labs and Tests Ordered: Current medicines are reviewed at length with the patient today.  Concerns regarding medicines are outlined above.   Tests Ordered: No orders of the defined types were placed in this encounter.   Medication Changes: Meds ordered this encounter  Medications  . nitroGLYCERIN (NITROSTAT) 0.4 MG SL tablet    Sig: Place 1 tablet (0.4 mg total) under the tongue every 5 (five) minutes as needed for chest pain.    Dispense:  25 tablet    Refill:  3  . carvedilol (COREG) 12.5 MG tablet    Sig: Take 1.5 tablets (18.75 mg total) by mouth 2 (two) times daily.    Dispense:  270 tablet    Refill:  3    05/26/20 increased to 18.75 mg BID    Disposition:  Follow up after procedure.  Signed, Satira Sark, MD, [facility] 05/16/2020 8:48 AM    [facility] at [facility] [address] [place], [place] [address] Phone: [phone]; Fax: [phone]

## 2020-05-16 NOTE — H&P (View-Only) (Signed)
Cardiology Office Note  Date: 05/16/2020   ID: Taahir, Grisby Apr 21, 1955, MRN [id number]  PCP:  Chevis Pretty, FNP  Cardiologist:  Rozann Lesches, MD Electrophysiologist:  None   Chief Complaint  Patient presents with  . Recurrent angina    History of Present Illness: [name] [name] is a 65 y.o. male last seen in December 2021.  He presents to the office for follow-up, had been participating in cardiac rehabilitation which was put on pause in early February due to recurring angina symptoms on the treadmill.  He underwent complex PCI in November 2021 as noted below, had been doing reasonably well on medical therapy, states that he has been compliant with medications.  Treadmill mainly seemed to bring on symptoms in cardiac rehab, but he has still had recurring angina with other activity, walking in from the parking lot for instance this morning.  He feels that symptoms are similar to pre-intervention late last year.  I reviewed his medications which are outlined below.  We discussed uptitrating Coreg.  We also discussed relook angiography to reevaluate coronary anatomy given somewhat abrupt change in symptoms on solid medical regimen.  Past Medical History:  Diagnosis Date  . Anxiety   . Coronary atherosclerosis of native coronary artery    DES distal circumflex 2005; DES LAD/diagonal bifurcation 09/2010; DES ostial LAD and DES left PL 01/2020  . Essential hypertension   . GERD (gastroesophageal reflux disease)   . History of kidney stones   . Mixed hyperlipidemia   . Myocardial infarction ([place])    Anterolateral with VF arrest 7/12  . Type 2 diabetes mellitus ([place])     Past Surgical History:  Procedure Laterality Date  . CARDIAC CATHETERIZATION  2012  . CAROTID STENT     stents x 2   . CHOLECYSTECTOMY N/A 12/04/2015   Procedure: LAPAROSCOPIC CHOLECYSTECTOMY;  Surgeon: Aviva Signs, MD;  Location: [facility];  Service: General;  Laterality: N/A;  .  CHOLECYSTECTOMY, LAPAROSCOPIC  12/05/2015  . CORONARY ANGIOPLASTY  2012   STENT X 1 2012, STENT X 1 YRS BEFORE  . CORONARY ATHERECTOMY N/A 02/09/2020   Procedure: CORONARY ATHERECTOMY;  Surgeon: Wellington Hampshire, MD;  Location: [facility];  Service: Cardiovascular;  Laterality: N/A;  . CORONARY STENT INTERVENTION N/A 02/09/2020   Procedure: CORONARY STENT INTERVENTION;  Surgeon: Wellington Hampshire, MD;  Location: [facility];  Service: Cardiovascular;  Laterality: N/A;  . HYDROCELE EXCISION Bilateral 06/02/2017   Procedure: HYDROCELECTOMY ADULT;  Surgeon: Ceasar Mons, MD;  Location: [facility];  Service: Urology;  Laterality: Bilateral;  ONLY NEEDS 45 MIN  . INGUINAL HERNIA REPAIR     RIGHT GROIN  . INTRAVASCULAR ULTRASOUND/IVUS N/A 02/09/2020   Procedure: Intravascular Ultrasound/IVUS;  Surgeon: Wellington Hampshire, MD;  Location: [facility];  Service: Cardiovascular;  Laterality: N/A;  . KNEE ARTHROSCOPY Left 05/23/2019   Procedure: LEFT KNEE ARTHROSCOPY AND DEBRIDEMENT PARTIAL MEDIAL MENISECTOMY;  Surgeon: Newt Minion, MD;  Location: [facility];  Service: Orthopedics;  Laterality: Left;  . LEFT HEART CATH AND CORONARY ANGIOGRAPHY N/A 02/07/2020   Procedure: LEFT HEART CATH AND CORONARY ANGIOGRAPHY;  Surgeon: Leonie Man, MD;  Location: [facility];  Service: Cardiovascular;  Laterality: N/A;  . TRIGGER FINGER RELEASE Right 01/08/2015   Procedure: RELEASE TRIGGER FINGER/A-1 PULLEY RIGHT MIDDLE FINGER;  Surgeon: Daryll Brod, MD;  Location: [employer];  Service: Orthopedics;  Laterality: Right;  Current Outpatient Medications  Medication Sig Dispense Refill  . ACCU-CHEK AVIVA PLUS test strip USE TO CHECK BLOOD SUGAR UP TO 4 TIMES DAILY. 100 strip 10  . Accu-Chek Softclix Lancets lancets USE TO CHECK BLOOD SUGAR UP TO 4 TIMES DAILY. 100 each 0  . aspirin 81 MG tablet Take 81 mg by mouth daily.    Marland Kitchen atorvastatin  (LIPITOR) 40 MG tablet Take 1 tablet (40 mg total) by mouth daily. 30 tablet 11  . blood glucose meter kit and supplies Dispense based on patient and insurance preference. Use up to four times daily as directed. (FOR ICD-10 E10.9, E11.9). 1 each 0  . carvedilol (COREG) 12.5 MG tablet Take 1.5 tablets (18.75 mg total) by mouth 2 (two) times daily. 270 tablet 3  . cetirizine (ZYRTEC) 10 MG tablet Take 10 mg by mouth daily.    . Cinnamon 500 MG TABS Take 1,000 mg by mouth 2 (two) times daily.     Marland Kitchen CRANBERRY PO Take 4,200 mg by mouth 2 (two) times daily.     Marland Kitchen GLOBAL EASE INJECT PEN NEEDLES 31G X 8 MM MISC Use up to 8 times a day Dx E11.8 800 each 3  . Insulin Aspart FlexPen 100 UNIT/ML SOPN INJECT UP TO 45 UNITS EVERY DAY (Patient taking differently: Inject 45-75 Units into the skin 2 (two) times daily with a meal. INJECT 45-75 units subq with breakfast and supper. Sliding scale) 15 mL 11  . isosorbide mononitrate (IMDUR) 30 MG 24 hr tablet Take 30 mg by mouth daily.    Marland Kitchen LANTUS SOLOSTAR 100 UNIT/ML Solostar Pen INJECT 44 UNITS EVERY MORNING AND 75 UNITS EVERY EVENING. 30 mL 0  . lisinopril-hydrochlorothiazide (ZESTORETIC) 20-12.5 MG tablet Take 1 tablet by mouth daily. 180 tablet 1  . metFORMIN (GLUCOPHAGE) 500 MG tablet Take 2 tablets (1,000 mg total) by mouth 2 (two) times daily. 120 tablet 1  . pantoprazole (PROTONIX) 40 MG tablet Take 1 tablet (40 mg total) by mouth daily as needed (for acid reflux). 90 tablet 1  . prasugrel (EFFIENT) 10 MG TABS tablet TAKE 1 TABLET ONCE DAILY. (Patient taking differently: Take 10 mg by mouth daily.) 90 tablet 1  . Semaglutide,0.25 or 0.5MG/DOS, (OZEMPIC, 0.25 OR 0.5 MG/DOSE,) 2 MG/1.5ML SOPN Inject 2 mg into the skin once a week. 12 mL 1  . tamsulosin (FLOMAX) 0.4 MG CAPS capsule Take 1 capsule (0.4 mg total) by mouth at bedtime. 90 capsule 1  . nitroGLYCERIN (NITROSTAT) 0.4 MG SL tablet Place 1 tablet (0.4 mg total) under the tongue every 5 (five) minutes as  needed for chest pain. 25 tablet 3   No current facility-administered medications for this visit.   Allergies:  Patient has no known allergies.   Social History: The patient  reports that he has never smoked. He has never used smokeless tobacco. He reports that he does not drink alcohol and does not use drugs.   Family History: The patient's family history includes Dementia in his father; Diabetes in his father and maternal grandfather; Hyperlipidemia in his father and mother; Hypertension in his father and mother.   ROS: No palpitations or syncope.  Physical Exam: VS:  BP 134/72   Pulse 88   Ht 5' 8" (1.727 m)   Wt 238 lb (108 kg)   SpO2 95%   BMI 36.19 kg/m , BMI Body mass index is 36.19 kg/m.  Wt Readings from Last 3 Encounters:  05/16/20 238 lb (108 kg)  05/06/20 237 lb  14 oz (107.9 kg)  05/06/20 238 lb (108 kg)    General: Patient appears comfortable at rest. HEENT: Conjunctiva and lids normal, wearing a mask. Neck: Supple, no elevated JVP or carotid bruits, no thyromegaly. Lungs: Clear to auscultation, nonlabored breathing at rest. Cardiac: Regular rate and rhythm, no S3 or significant systolic murmur, no pericardial rub. Abdomen: Soft, bowel sounds present. Extremities: No pitting edema. Skin: Warm and dry. Musculoskeletal: No kyphosis. Neuropsychiatric: Alert and oriented x3, affect grossly appropriate.  ECG:  An ECG dated 02/09/2020 was personally reviewed today and demonstrated:  Normal sinus rhythm.  Recent Labwork: 05/06/2020: ALT 33; AST 24; BUN 19; Creatinine, Ser 1.07; Hemoglobin 13.9; Platelets 275; Potassium 4.6; Sodium 139     Component Value Date/Time   CHOL 95 (L) 05/06/2020 1125   TRIG 145 05/06/2020 1125   HDL 23 (L) 05/06/2020 1125   CHOLHDL 4.1 05/06/2020 1125   CHOLHDL 5.6 02/08/2020 0626   VLDL 38 02/08/2020 0626   LDLCALC 47 05/06/2020 1125    Other Studies Reviewed Today:  Echocardiogram 02/08/2020: 1. Left ventricular ejection  fraction, by estimation, is 60 to 65%. The  left ventricle has normal function. The left ventricle has no regional  wall motion abnormalities. Left ventricular diastolic parameters are  consistent with Grade I diastolic  dysfunction (impaired relaxation).  2. Right ventricular systolic function is normal. The right ventricular  size is normal. Tricuspid regurgitation signal is inadequate for assessing  PA pressure.  3. The mitral valve is normal in structure. No evidence of mitral valve  regurgitation. No evidence of mitral stenosis.  4. The aortic valve is tricuspid. Aortic valve regurgitation is not  visualized. Mild aortic valve sclerosis is present, with no evidence of  aortic valve stenosis.  5. Aortic dilatation noted. There is mild dilatation of the ascending  aorta, measuring 40 mm.   PCI 02/09/2020:  Prox RCA lesion is 70% stenosed.  Dist LM to Prox LAD lesion is 95% stenosed.  Dist LAD lesion is 60% stenosed.  Mid LAD lesion is 20% stenosed.  Prox LAD to Mid LAD lesion is 20% stenosed.  1st Mrg lesion is 85% stenosed.  1st LPL-1 lesion is 95% stenosed.  1st LPL-2 lesion is 80% stenosed.  LPAV-1 lesion is 30% stenosed.  LPAV-2 lesion is 100% stenosed.  Post intervention, there is a 0% residual stenosis.  A drug-eluting stent was successfully placed using a SYNERGY XD 3.50X12.  Post intervention, there is a 0% residual stenosis.  A drug-eluting stent was successfully placed using a SYNERGY XD 2.25X16.   1. Successful IVUS guided orbital atherectomy and drug eluting stent placement to the ostial LAD. 2.  Successful angioplasty and drug-eluting stent placement to first PL branch of the left circumflex.  Assessment and Plan:  1.  Accelerating angina on stable medical therapy.  Patient has multivessel CAD status post DES to the circumflex in 2005, DES to the LAD/diagonal in 2012, and more recently orbital atherectomy with DES to the ostial LAD and DES to the  first PL branch of the circumflex in November 2021.  He is experiencing recurrent angina similar to preintervention symptoms, cardiac rehabilitation put on pause.  We discussed risks and benefits of a relook diagnostic cardiac catheterization, he agrees to proceed.  Continue aspirin, Effient, Lipitor, Zestoretic, Imdur and increase Coreg to 18.75 mg twice daily.  2.  Mixed hyperlipidemia, doing well on Lipitor.  Last LDL 47.  3.  Type 2 diabetes mellitus, on Glucophage, Ozempic, and Lantus.  Medication  Adjustments/Labs and Tests Ordered: Current medicines are reviewed at length with the patient today.  Concerns regarding medicines are outlined above.   Tests Ordered: No orders of the defined types were placed in this encounter.   Medication Changes: Meds ordered this encounter  Medications  . nitroGLYCERIN (NITROSTAT) 0.4 MG SL tablet    Sig: Place 1 tablet (0.4 mg total) under the tongue every 5 (five) minutes as needed for chest pain.    Dispense:  25 tablet    Refill:  3  . carvedilol (COREG) 12.5 MG tablet    Sig: Take 1.5 tablets (18.75 mg total) by mouth 2 (two) times daily.    Dispense:  270 tablet    Refill:  3    05/26/20 increased to 18.75 mg BID    Disposition:  Follow up after procedure.  Signed, Satira Sark, MD, [facility] 05/16/2020 8:48 AM    [facility] at [facility] [address] [place], [place] [address] Phone: [phone]; Fax: [phone]

## 2020-05-16 NOTE — Patient Instructions (Addendum)
     [place] [address] Dept: [phone] Loc: [phone]  [name] [name]  05/16/2020  You are scheduled for a Cardiac Catheterization on Wednesday, February 23 with Dr. Larae Grooms.  1. Please arrive at the [facility] (Main Entrance A) at [facility]: [address] [place], [place] [address] at 9:30 AM (This time is two hours before your procedure to ensure your preparation). Free valet parking service is available.   Special note: Every effort is made to have your procedure done on time. Please understand that emergencies sometimes delay scheduled procedures.  2. Diet: Do not eat solid foods after midnight.  The patient may have clear liquids until 5am upon the day of the procedure.  3. Labs: You will need to have blood drawn -ALREADY DONE  4. Medication instructions in preparation for your procedure:   Contrast Allergy: No    STOP taking Lisinopril/HCTZ day of cath  Take 1/2 dose of insulin the night before, and none the day of cath   STOP Metformin the day before cath and Hold for 2 days after cath   On the morning of your procedure, take your Aspirin 81 mg and any morning medicines NOT listed above.  You may use sips of water.  5. Plan for one night stay--bring personal belongings. 6. Bring a current list of your medications and current insurance cards. 7. You MUST have a responsible person to drive you home. 8. Someone MUST be with you the first 24 hours after you arrive home or your discharge will be delayed. 9. Please wear clothes that are easy to get on and off and wear slip-on shoes.  Thank you for allowing [place] to care for you!   -- [facility]        INCREASE Coreg to 18.75 mg (take 1 1/2 tablets of your 12.5 mg tablets) twice a day

## 2020-05-17 ENCOUNTER — Encounter (HOSPITAL_COMMUNITY): Payer: Medicare PPO

## 2020-05-20 ENCOUNTER — Encounter (HOSPITAL_COMMUNITY): Payer: Medicare PPO

## 2020-05-20 ENCOUNTER — Other Ambulatory Visit (HOSPITAL_COMMUNITY)
Admission: RE | Admit: 2020-05-20 | Discharge: 2020-05-20 | Disposition: A | Discharge status: Home / Self Care | Payer: Medicare PPO | Source: Ambulatory Visit | Attending: Interventional Cardiology | Admitting: Interventional Cardiology

## 2020-05-20 DIAGNOSIS — Z20822 Contact with and (suspected) exposure to covid-19: Secondary | ICD-10-CM | POA: Diagnosis not present

## 2020-05-20 DIAGNOSIS — Z01812 Encounter for preprocedural laboratory examination: Secondary | ICD-10-CM | POA: Diagnosis not present

## 2020-05-20 LAB — SARS CORONAVIRUS 2 (TAT 6-24 HRS): SARS Coronavirus 2: NEGATIVE

## 2020-05-21 ENCOUNTER — Telehealth: Payer: Self-pay | Admitting: *Deleted

## 2020-05-21 ENCOUNTER — Ambulatory Visit: Payer: Medicare PPO | Admitting: Family Medicine

## 2020-05-21 NOTE — Telephone Encounter (Signed)
Pt contacted pre-catheterization scheduled at [facility] for: Wednesday May 22, 2020 11:30 AM Verified arrival time and place: [facility]) at: 9:30 AM   No solid food after midnight prior to cath, clear liquids until 5 AM day of procedure.  Hold: Insulin-AM of procedure / 1/2 usual HS dose prior to procedure Metformin-day of procedure and 48 hours post procedure Lisinopril-HCTZ-AM of procedure  Except hold medications AM meds can be  taken pre-cath with sips of water including: ASA 81 mg Effient 10 mg  Confirmed patient has responsible adult to drive home post procedure and be with patient first 24 hours after arriving home: yes  You are allowed ONE visitor in the waiting room during the time you are at the hospital for your procedure. Both you and your visitor must wear a mask once you enter the hospital.   Reviewed procedure/mask/visitor instructions with patient.

## 2020-05-22 ENCOUNTER — Ambulatory Visit (HOSPITAL_COMMUNITY)
Admission: RE | Admit: 2020-05-22 | Discharge: 2020-05-22 | Disposition: A | Discharge status: Home / Self Care | Payer: Medicare PPO | Attending: Cardiovascular Disease | Admitting: Cardiovascular Disease

## 2020-05-22 ENCOUNTER — Encounter (HOSPITAL_COMMUNITY): Payer: Medicare PPO

## 2020-05-22 ENCOUNTER — Encounter (HOSPITAL_COMMUNITY)
Admission: RE | Disposition: A | Discharge status: Home / Self Care | Payer: Medicare PPO | Source: Home / Self Care | Attending: Cardiovascular Disease

## 2020-05-22 ENCOUNTER — Other Ambulatory Visit: Payer: Self-pay

## 2020-05-22 DIAGNOSIS — I25110 Atherosclerotic heart disease of native coronary artery with unstable angina pectoris: Secondary | ICD-10-CM | POA: Diagnosis not present

## 2020-05-22 DIAGNOSIS — E782 Mixed hyperlipidemia: Secondary | ICD-10-CM | POA: Insufficient documentation

## 2020-05-22 DIAGNOSIS — Z7984 Long term (current) use of oral hypoglycemic drugs: Secondary | ICD-10-CM | POA: Diagnosis not present

## 2020-05-22 DIAGNOSIS — Z79899 Other long term (current) drug therapy: Secondary | ICD-10-CM | POA: Insufficient documentation

## 2020-05-22 DIAGNOSIS — I200 Unstable angina: Secondary | ICD-10-CM

## 2020-05-22 DIAGNOSIS — Z95820 Peripheral vascular angioplasty status with implants and grafts: Secondary | ICD-10-CM

## 2020-05-22 DIAGNOSIS — Z7982 Long term (current) use of aspirin: Secondary | ICD-10-CM | POA: Insufficient documentation

## 2020-05-22 DIAGNOSIS — Z8249 Family history of ischemic heart disease and other diseases of the circulatory system: Secondary | ICD-10-CM | POA: Insufficient documentation

## 2020-05-22 DIAGNOSIS — Z955 Presence of coronary angioplasty implant and graft: Secondary | ICD-10-CM | POA: Diagnosis not present

## 2020-05-22 DIAGNOSIS — E785 Hyperlipidemia, unspecified: Secondary | ICD-10-CM | POA: Insufficient documentation

## 2020-05-22 DIAGNOSIS — E119 Type 2 diabetes mellitus without complications: Secondary | ICD-10-CM | POA: Insufficient documentation

## 2020-05-22 DIAGNOSIS — Z794 Long term (current) use of insulin: Secondary | ICD-10-CM | POA: Insufficient documentation

## 2020-05-22 DIAGNOSIS — E1169 Type 2 diabetes mellitus with other specified complication: Secondary | ICD-10-CM | POA: Diagnosis present

## 2020-05-22 HISTORY — PX: LEFT HEART CATH AND CORONARY ANGIOGRAPHY: CATH118249

## 2020-05-22 HISTORY — PX: CORONARY STENT INTERVENTION: CATH118234

## 2020-05-22 HISTORY — PX: CORONARY IMAGING/OCT: CATH118326

## 2020-05-22 LAB — GLUCOSE, CAPILLARY
Glucose-Capillary: 192 mg/dL — ABNORMAL HIGH (ref 70–99)
Glucose-Capillary: 154 mg/dL — ABNORMAL HIGH (ref 70–99)

## 2020-05-22 LAB — POCT ACTIVATED CLOTTING TIME
Activated Clotting Time: 374 seconds
Activated Clotting Time: 362 seconds

## 2020-05-22 SURGERY — LEFT HEART CATH AND CORONARY ANGIOGRAPHY
Anesthesia: LOCAL

## 2020-05-22 MED ORDER — SODIUM CHLORIDE 0.9 % WEIGHT BASED INFUSION: 1.0000 mL/kg/h | INTRAVENOUS | Status: DC

## 2020-05-22 MED ORDER — FENTANYL CITRATE (PF) 100 MCG/2ML IJ SOLN
INTRAMUSCULAR | Status: AC
  Filled 2020-05-22: qty 2

## 2020-05-22 MED ORDER — IOHEXOL 350 MG/ML SOLN
INTRAVENOUS | Status: DC | PRN
  Administered 2020-05-22: 180 mL

## 2020-05-22 MED ORDER — MIDAZOLAM HCL 2 MG/2ML IJ SOLN
INTRAMUSCULAR | Status: AC
  Filled 2020-05-22: qty 2

## 2020-05-22 MED ORDER — SODIUM CHLORIDE 0.9 % WEIGHT BASED INFUSION
3.0000 mL/kg/h | INTRAVENOUS | Status: DC
  Administered 2020-05-22: 3 mL/kg/h via INTRAVENOUS

## 2020-05-22 MED ORDER — ATORVASTATIN CALCIUM 80 MG PO TABS: 80.0000 mg | ORAL_TABLET | Freq: Every day | ORAL | 3 refills | Status: DC

## 2020-05-22 MED ORDER — HEPARIN (PORCINE) IN NACL 1000-0.9 UT/500ML-% IV SOLN
INTRAVENOUS | Status: AC
  Filled 2020-05-22: qty 1000

## 2020-05-22 MED ORDER — MIDAZOLAM HCL 2 MG/2ML IJ SOLN
INTRAMUSCULAR | Status: DC | PRN
  Administered 2020-05-22 (×3): 1 mg via INTRAVENOUS
  Administered 2020-05-22: 2 mg via INTRAVENOUS

## 2020-05-22 MED ORDER — HEPARIN (PORCINE) IN NACL 1000-0.9 UT/500ML-% IV SOLN
INTRAVENOUS | Status: DC | PRN
  Administered 2020-05-22 (×2): 500 mL

## 2020-05-22 MED ORDER — NITROGLYCERIN 0.4 MG SL SUBL: 0.4000 mg | SUBLINGUAL_TABLET | SUBLINGUAL | Status: DC | PRN

## 2020-05-22 MED ORDER — FENTANYL CITRATE (PF) 100 MCG/2ML IJ SOLN
INTRAMUSCULAR | Status: DC | PRN
  Administered 2020-05-22 (×4): 25 ug via INTRAVENOUS

## 2020-05-22 MED ORDER — SODIUM CHLORIDE 0.9 % IV SOLN: 250.0000 mL | INTRAVENOUS | Status: DC | PRN

## 2020-05-22 MED ORDER — HEPARIN SODIUM (PORCINE) 1000 UNIT/ML IJ SOLN
INTRAMUSCULAR | Status: AC
  Filled 2020-05-22: qty 1

## 2020-05-22 MED ORDER — LIDOCAINE HCL (PF) 1 % IJ SOLN
INTRAMUSCULAR | Status: AC
  Filled 2020-05-22: qty 30

## 2020-05-22 MED ORDER — LORATADINE 10 MG PO TABS: 10.0000 mg | ORAL_TABLET | Freq: Every day | ORAL | Status: DC

## 2020-05-22 MED ORDER — ASPIRIN 81 MG PO TABS: 81.0000 mg | ORAL_TABLET | Freq: Every day | ORAL | Status: DC

## 2020-05-22 MED ORDER — TAMSULOSIN HCL 0.4 MG PO CAPS: 0.4000 mg | ORAL_CAPSULE | Freq: Every day | ORAL | Status: DC

## 2020-05-22 MED ORDER — SODIUM CHLORIDE 0.9% FLUSH: 3.0000 mL | INTRAVENOUS | Status: DC | PRN

## 2020-05-22 MED ORDER — ASPIRIN 81 MG PO CHEW: 81.0000 mg | CHEWABLE_TABLET | ORAL | Status: DC

## 2020-05-22 MED ORDER — HEPARIN SODIUM (PORCINE) 1000 UNIT/ML IJ SOLN
INTRAMUSCULAR | Status: DC | PRN
  Administered 2020-05-22: 7000 [IU] via INTRAVENOUS
  Administered 2020-05-22: 5000 [IU] via INTRAVENOUS

## 2020-05-22 MED ORDER — LISINOPRIL-HYDROCHLOROTHIAZIDE 20-12.5 MG PO TABS: 1.0000 | ORAL_TABLET | Freq: Every day | ORAL | Status: DC

## 2020-05-22 MED ORDER — ISOSORBIDE MONONITRATE ER 30 MG PO TB24: 30.0000 mg | ORAL_TABLET | Freq: Every day | ORAL | Status: DC

## 2020-05-22 MED ORDER — LABETALOL HCL 5 MG/ML IV SOLN: 10.0000 mg | INTRAVENOUS | Status: DC | PRN

## 2020-05-22 MED ORDER — ONDANSETRON HCL 4 MG/2ML IJ SOLN: 4.0000 mg | Freq: Four times a day (QID) | INTRAMUSCULAR | Status: DC | PRN

## 2020-05-22 MED ORDER — HYDRALAZINE HCL 20 MG/ML IJ SOLN: 10.0000 mg | INTRAMUSCULAR | Status: DC | PRN

## 2020-05-22 MED ORDER — ATORVASTATIN CALCIUM 40 MG PO TABS: 40.0000 mg | ORAL_TABLET | Freq: Every day | ORAL | Status: DC

## 2020-05-22 MED ORDER — LIDOCAINE HCL (PF) 1 % IJ SOLN
INTRAMUSCULAR | Status: DC | PRN
  Administered 2020-05-22: 2 mL

## 2020-05-22 MED ORDER — SODIUM CHLORIDE 0.9 % IV SOLN: INTRAVENOUS | Status: AC

## 2020-05-22 MED ORDER — PANTOPRAZOLE SODIUM 40 MG PO TBEC: 40.0000 mg | DELAYED_RELEASE_TABLET | Freq: Every day | ORAL | Status: DC | PRN

## 2020-05-22 MED ORDER — SODIUM CHLORIDE 0.9% FLUSH: 3.0000 mL | Freq: Two times a day (BID) | INTRAVENOUS | Status: DC

## 2020-05-22 MED ORDER — ACETAMINOPHEN 325 MG PO TABS: 650.0000 mg | ORAL_TABLET | ORAL | Status: DC | PRN

## 2020-05-22 MED ORDER — VERAPAMIL HCL 2.5 MG/ML IV SOLN
INTRAVENOUS | Status: DC | PRN
  Administered 2020-05-22 (×2): 10 mL via INTRA_ARTERIAL

## 2020-05-22 MED ORDER — PRASUGREL HCL 10 MG PO TABS: 10.0000 mg | ORAL_TABLET | Freq: Every day | ORAL | Status: DC

## 2020-05-22 MED ORDER — VERAPAMIL HCL 2.5 MG/ML IV SOLN
INTRAVENOUS | Status: AC
  Filled 2020-05-22: qty 2

## 2020-05-22 MED ORDER — CARVEDILOL 6.25 MG PO TABS: 18.7500 mg | ORAL_TABLET | Freq: Two times a day (BID) | ORAL | Status: DC

## 2020-05-22 MED ORDER — SEMAGLUTIDE(0.25 OR 0.5MG/DOS) 2 MG/1.5ML ~~LOC~~ SOPN: 2.0000 mg | PEN_INJECTOR | SUBCUTANEOUS | Status: DC

## 2020-05-22 SURGICAL SUPPLY — 22 items
BAG SNAP BAND KOVER 36X36 (MISCELLANEOUS) ×1 IMPLANT
BALLN SAPPHIRE ~~LOC~~ 4.0X12 (BALLOONS) ×1 IMPLANT
BALLN WOLVERINE 3.25X10 (BALLOONS) ×2
BALLOON WOLVERINE 3.25X10 (BALLOONS) IMPLANT
CATH 5FR JL3.5 JR4 ANG PIG MP (CATHETERS) ×1 IMPLANT
CATH DRAGONFLY OPSTAR (CATHETERS) ×1 IMPLANT
CATH LAUNCHER 6FR EBU3.5 (CATHETERS) ×1 IMPLANT
COVER DOME SNAP 22 D (MISCELLANEOUS) ×1 IMPLANT
DEVICE RAD COMP TR BAND LRG (VASCULAR PRODUCTS) ×1 IMPLANT
GLIDESHEATH SLEND SS 6F .021 (SHEATH) ×1 IMPLANT
GUIDEWIRE INQWIRE 1.5J.035X260 (WIRE) IMPLANT
INQWIRE 1.5J .035X260CM (WIRE) ×2
KIT ENCORE 26 ADVANTAGE (KITS) ×1 IMPLANT
KIT HEART LEFT (KITS) ×2 IMPLANT
KIT HEMO VALVE WATCHDOG (MISCELLANEOUS) ×1 IMPLANT
PACK CARDIAC CATHETERIZATION (CUSTOM PROCEDURE TRAY) ×2 IMPLANT
STENT SYNERGY XD 3.50X12 (Permanent Stent) IMPLANT
SYNERGY XD 3.50X12 (Permanent Stent) ×2 IMPLANT
TRANSDUCER W/STOPCOCK (MISCELLANEOUS) ×2 IMPLANT
TUBING CIL FLEX 10 FLL-RA (TUBING) ×2 IMPLANT
WIRE ASAHI PROWATER 180CM (WIRE) ×1 IMPLANT
WIRE HI TORQ BMW 190CM (WIRE) ×1 IMPLANT

## 2020-05-22 NOTE — Discharge Instructions (Signed)
Drink plenty of fluids for 48 hours and keep wrist elevated at heart level for 24 hours Hold Metformin for 48 hours post procedure (2/25 pm)  Radial Site Care   This sheet gives you information about how to care for yourself after your procedure. Your health care provider may also give you more specific instructions. If you have problems or questions, contact your health care provider. What can I expect after the procedure? After the procedure, it is common to have:  Bruising and tenderness at the catheter insertion area. Follow these instructions at home: Medicines  Take over-the-counter and prescription medicines only as told by your health care provider. Insertion site care 1. Follow instructions from your health care provider about how to take care of your insertion site. Make sure you: ? Wash your hands with soap and water before you change your bandage (dressing). If soap and water are not available, use hand sanitizer. ? Remove your dressing as told by your health care provider. In 24 hours 2. Check your insertion site every day for signs of infection. Check for: ? Redness, swelling, or pain. ? Fluid or blood. ? Pus or a bad smell. ? Warmth. 3. Do not take baths, swim, or use a hot tub until your health care provider approves. 4. You may shower 24-48 hours after the procedure, or as directed by your health care provider. ? Remove the dressing and gently wash the site with plain soap and water. ? Pat the area dry with a clean towel. ? Do not rub the site. That could cause bleeding. 5. Do not apply powder or lotion to the site. Activity   1. For 24 hours after the procedure, or as directed by your health care provider: ? Do not flex or bend the affected arm. ? Do not push or pull heavy objects with the affected arm. ? Do not drive yourself home from the hospital or clinic. You may drive 24 hours after the procedure unless your health care provider tells you not to. ? Do not  operate machinery or power tools. 2. Do not lift anything that is heavier than 5 lb, or the limit that you are told, until your health care provider says that it is safe.  For 7 days 3. Ask your health care provider when it is okay to: ? Return to work or school. ? Resume usual physical activities or sports. ? Resume sexual activity. General instructions  If the catheter site starts to bleed, raise your arm and put firm pressure on the site. If the bleeding does not stop, get help right away. This is a medical emergency.  If you went home on the same day as your procedure, a responsible adult should be with you for the first 24 hours after you arrive home.  Keep all follow-up visits as told by your health care provider. This is important. Contact a health care provider if:  You have a fever.  You have redness, swelling, or yellow drainage around your insertion site. Get help right away if:  You have unusual pain at the radial site.  The catheter insertion area swells very fast.  The insertion area is bleeding, and the bleeding does not stop when you hold steady pressure on the area.  Your arm or hand becomes pale, cool, tingly, or numb. These symptoms may represent a serious problem that is an emergency. Do not wait to see if the symptoms will go away. Get medical help right away. Call your local  emergency services (911 in the [place] Do not drive yourself to the hospital. Summary  After the procedure, it is common to have bruising and tenderness at the site.  Follow instructions from your health care provider about how to take care of your radial site wound. Check the wound every day for signs of infection.  Do not lift anything that is heavier than 5 lb, or the limit that you are told, until your health care provider says that it is safe. This information is not intended to replace advice given to you by your health care provider. Make sure you discuss any questions you have with  your health care provider. Document Revised: 04/21/2017 Document Reviewed: 04/21/2017 Elsevier Patient Education  2020 [place] about your medication: Effient (anti-platelet agent)  Generic Name (Brand): prasugrel (Effient), once daily medication  PURPOSE: You are taking this medication along with aspirin to lower your chance of having a heart attack, stroke, or blood clots in your heart stent. These can be fatal. Effient and aspirin help prevent platelets from sticking together and forming a clot that can block an artery or your stent.   Common SIDE EFFECTS you may experience include: bruising or bleeding more easily, shortness of breath  Do not stop taking EFFIENT without talking to the doctor who prescribes it for you. People who are treated with a stent and stop taking Effient too soon, have a higher risk of getting a blood clot in the stent, having a heart attack, or dying. If you stop Effient because of bleeding, or for other reasons, your risk of a heart attack or stroke may increase.   Avoid taking NSAID agents or anti-inflammatory medications such as ibuprofen, naproxen given increased bleed risk with plavix - can use acetaminophen (Tylenol) if needed for pain.  Tell all of your doctors and dentists that you are taking Effient. They should talk to the doctor who prescribed Effient for you before you have any surgery or invasive procedure.   Contact your health care provider if you experience: severe or uncontrollable bleeding, pink/red/brown urine, vomiting blood or vomit that looks like "coffee grounds", red or black stools (looks like tar), coughing up blood or blood clots ----------------------------------------------------------------------------------------------------------------------

## 2020-05-22 NOTE — Interval H&P Note (Signed)
Cath Lab Visit (complete for each Cath Lab visit)  Clinical Evaluation Leading to the Procedure:   ACS: No.  Non-ACS:    Anginal Classification: CCS III  Anti-ischemic medical therapy: Minimal Therapy (1 class of medications)  Non-Invasive Test Results: No non-invasive testing performed  Prior CABG: No previous CABG      History and Physical Interval Note:  05/22/2020 9:33 AM  [name] [name]  has presented today for surgery, with the diagnosis of accelerating angina.  The various methods of treatment have been discussed with the patient and family. After consideration of risks, benefits and other options for treatment, the patient has consented to  Procedure(s): LEFT HEART CATH AND CORONARY ANGIOGRAPHY (N/A) as a surgical intervention.  The patient's history has been reviewed, patient examined, no change in status, stable for surgery.  I have reviewed the patient's chart and labs.  Questions were answered to the patient's satisfaction.     Larae Grooms

## 2020-05-22 NOTE — Progress Notes (Signed)
[id number] Discussed with pt the importance of effient with stent. Reviewed NTG use, walking for exercise, gave diabetic and heart healthy diets and referral sent back to [facility]. Pt stated he has been attending and really was enjoying. Looking forward to returning when cardiologist gives permission. Pt stated he has seen dietitian there. Encouraged to start his walking program slowly as instructions given. Graylon Good RN BSN 05/22/2020 2:14 PM

## 2020-05-22 NOTE — Discharge Summary (Addendum)
Discharge Summary for Same Day PCI   Patient ID: [name] [name] MRN: [id number]; DOB: 1955/10/13  Admit date: 05/22/2020 Discharge date: 05/22/2020  Primary Care Provider: Chevis Pretty, Keith  Primary Cardiologist: Rozann Lesches, MD  Primary Electrophysiologist:  None   Discharge Diagnoses    Principal Problem:   Accelerating angina [facility]) Active Problems:   Hyperlipidemia with target LDL less than 70    Diagnostic Studies/Procedures    Cardiac Catheterization 05/22/2020:  Prox RCA lesion is 70% stenosed. Dist LAD lesion is 60% stenosed. 1st Mrg lesion is 85% stenosed. 1st LPL-2 lesion is 80% stenosed. Previously placed 1st LPL-1 drug eluting stent is widely patent. LPAV-2 lesion is 100% stenosed. Dist LM to Prox LAD lesion is 90% stenosed, instent restenosis. Treated with 3.25 mm [place] and then 4.0 [place] balloon. Dissection noted by OCT past the edge of old stent. A drug-eluting stent was successfully placed using a SYNERGY XD 3.50X12., post dilated to 4.0 mm. Post intervention, there is a 0% residual stenosis. Stent optimized with OCT. Mid LAD-1 lesion is 20% stenosed. Mid LAD-2 lesion is 75% stenosed. In-stent restenosis. Scoring balloon angioplasty was performed using a BALLOON WOLVERINE 3.25X10. Post intervention, there is a 0% residual stenosis. The left ventricular systolic function is normal. LV end diastolic pressure is normal. The left ventricular ejection fraction is 55-65% by visual estimate. There is no aortic valve stenosis. A drug-eluting stent was successfully placed using a SYNERGY XD 3.50X12.   Continue dual antiplatelet therapy along with aggressive secondary prevention.    Diffuse, distal and small vessel disease in the LAD and OM territories.  Aggressive medical therapy.   Diagnostic Dominance: Left    Intervention     _____________   History of Present Illness     [name] [name] is a 65 y.o. male with PMH of CAD (DES to Lcx '05,  DES to LAD/diag '12), HTN, HLD, GERD and DM2 who presented to the office for outpatient follow up. Reports he had been participating in cardiac rehab but had to stop in early February 2/2 recurring angina while on the treadmill. He underwent complex PCI back in Nov 2021 and had been doing well, along with being compliant with his home medications. At office visit reported ongoing episodes of chest pain while walking and especially while using the treadmill. Option of relook cath was discussed with the patient given his abrupt change in symptoms.   Cardiac catheterization was arranged for further evaluation.  Hospital Course     The patient underwent cardiac cath as noted above with 90% ISR of stent placed in Nov 2021 with PCI/DES x1, and balloon angioplasty of mLAD 75% lesion in prior stent. Notable diffuse, distal and small vessel disease in the LAD and OM territories. Plan for DAPT with ASA/Effient suspect indefinitely given ISR and diffuse disease. The patient was seen by cardiac rehab while in short stay. There were no observed complications post cath. Radial cath site was re-evaluated prior to discharge and found to be stable without any complications. Instructions/precautions regarding cath site care were given prior to discharge. Of note, his atorvastatin was increased to 15m daily prior to discharge which the patient was agreeable too.   DDarcey Norawas seen by Dr. VIrish Lackand determined stable for discharge home. Follow up with our office has been arranged. Medications are listed below. Pertinent changes include increase in atorvastatin to 863mdaily.  _____________  Cath/PCI Registry Performance & Quality Measures: Aspirin prescribed? - Yes ADP Receptor Inhibitor (Plavix/Clopidogrel,  Brilinta/Ticagrelor or Effient/Prasugrel) prescribed (includes medically managed patients)? - Yes High Intensity Statin (Lipitor 40-28m or Crestor 20-411m prescribed? - Yes For EF <40%, was ACEI/ARB  prescribed? - Yes For EF <40%, Aldosterone Antagonist (Spironolactone or Eplerenone) prescribed? - Not Applicable (EF >/= 4037%Cardiac Rehab Phase II ordered (Included Medically managed Patients)? - Yes  _____________   Discharge Vitals Blood pressure (!) 157/77, pulse 80, temperature 98.2 F (36.8 C), temperature source Oral, resp. rate 17, SpO2 95 %.  There were no vitals filed for this visit.  Last Labs & Radiologic Studies    CBC No results for input(s): WBC, NEUTROABS, HGB, HCT, MCV, PLT in the last 72 hours. Basic Metabolic Panel No results for input(s): NA, K, CL, CO2, GLUCOSE, BUN, CREATININE, CALCIUM, MG, PHOS in the last 72 hours. Liver Function Tests No results for input(s): AST, ALT, ALKPHOS, BILITOT, PROT, ALBUMIN in the last 72 hours. No results for input(s): LIPASE, AMYLASE in the last 72 hours. High Sensitivity Troponin:   No results for input(s): TROPONINIHS in the last 720 hours.  BNP Invalid input(s): POCBNP D-Dimer No results for input(s): DDIMER in the last 72 hours. Hemoglobin A1C No results for input(s): HGBA1C in the last 72 hours. Fasting Lipid Panel No results for input(s): CHOL, HDL, LDLCALC, TRIG, CHOLHDL, LDLDIRECT in the last 72 hours. Thyroid Function Tests No results for input(s): TSH, T4TOTAL, T3FREE, THYROIDAB in the last 72 hours.  Invalid input(s): FREET3 _____________  CARDIAC CATHETERIZATION  Result Date: 05/22/2020  Prox RCA lesion is 70% stenosed.  Dist LAD lesion is 60% stenosed.  1st Mrg lesion is 85% stenosed.  1st LPL-2 lesion is 80% stenosed.  Previously placed 1st LPL-1 drug eluting stent is widely patent.  LPAV-2 lesion is 100% stenosed.  Dist LM to Prox LAD lesion is 90% stenosed, instent restenosis. Treated with 3.25 mm Wolverine and then 4.0 [place] balloon. Dissection noted by OCT past the edge of old stent. A drug-eluting stent was successfully placed using a SYNERGY XD 3.50X12., post dilated to 4.0 mm.  Post intervention,  there is a 0% residual stenosis. Stent optimized with OCT.  Mid LAD-1 lesion is 20% stenosed.  Mid LAD-2 lesion is 75% stenosed. In-stent restenosis. Scoring balloon angioplasty was performed using a BALLOON WOLVERINE 3.25X10.  Post intervention, there is a 0% residual stenosis.  The left ventricular systolic function is normal.  LV end diastolic pressure is normal.  The left ventricular ejection fraction is 55-65% by visual estimate.  There is no aortic valve stenosis.  A drug-eluting stent was successfully placed using a SYNERGY XD 3.50X12.  Continue dual antiplatelet therapy along with aggressive secondary prevention. Diffuse, distal and small vessel disease in the LAD and OM territories.  Aggressive medical therapy.    Disposition   Pt is being discharged home today in good condition.  Follow-up Plans & Appointments     Follow-up Information     QuVerta Ellen NP Follow up on 06/06/2020.   Specialty: Cardiology Why: at 8:30am for your follow up appt Contact information: [phone] [phone]              Discharge Instructions     Amb Referral to Cardiac Rehabilitation   Complete by: As directed    Diagnosis:  Coronary Stents PTCA     After initial evaluation and assessments completed: Virtual Based Care may be provided alone or in conjunction with Phase 2 Cardiac Rehab based on patient  barriers.: Yes        Discharge Medications   Allergies as of 05/22/2020   No Known Allergies      Medication List     TAKE these medications    Accu-Chek Aviva Plus test strip Generic drug: glucose blood USE TO CHECK BLOOD SUGAR UP TO 4 TIMES DAILY.   Accu-Chek Softclix Lancets lancets USE TO CHECK BLOOD SUGAR UP TO 4 TIMES DAILY.   aspirin 81 MG tablet Take 81 mg by mouth daily.   atorvastatin 80 MG tablet Commonly known as: LIPITOR Take 1 tablet (80 mg total) by mouth daily. What changed:  medication  strength how much to take   blood glucose meter kit and supplies Dispense based on patient and insurance preference. Use up to four times daily as directed. (FOR ICD-10 E10.9, E11.9).   carvedilol 12.5 MG tablet Commonly known as: COREG Take 1.5 tablets (18.75 mg total) by mouth 2 (two) times daily.   cetirizine 10 MG tablet Commonly known as: ZYRTEC Take 10 mg by mouth daily.   Cinnamon 500 MG Tabs Take 1,000 mg by mouth 2 (two) times daily.   CRANBERRY PO Take 4,200 mg by mouth 2 (two) times daily.   Global Ease Inject Pen Needles 31G X 8 MM Misc Generic drug: Insulin Pen Needle Use up to 8 times a day Dx E11.8   Insulin Aspart FlexPen 100 UNIT/ML Sopn INJECT UP TO 45 UNITS EVERY DAY What changed:  how much to take how to take this when to take this additional instructions   isosorbide mononitrate 30 MG 24 hr tablet Commonly known as: IMDUR Take 30 mg by mouth daily.   Lantus SoloStar 100 UNIT/ML Solostar Pen Generic drug: insulin glargine INJECT 44 UNITS EVERY MORNING AND 75 UNITS EVERY EVENING. What changed: See the new instructions.   lisinopril-hydrochlorothiazide 20-12.5 MG tablet Commonly known as: Zestoretic Take 1 tablet by mouth daily.   metFORMIN 500 MG tablet Commonly known as: GLUCOPHAGE Take 2 tablets (1,000 mg total) by mouth 2 (two) times daily.   nitroGLYCERIN 0.4 MG SL tablet Commonly known as: NITROSTAT Place 1 tablet (0.4 mg total) under the tongue every 5 (five) minutes as needed for chest pain.   Ozempic (0.25 or 0.5 MG/DOSE) 2 MG/1.5ML Sopn Generic drug: Semaglutide(0.25 or 0.5MG/DOS) Inject 2 mg into the skin once a week. What changed: how much to take   pantoprazole 40 MG tablet Commonly known as: PROTONIX Take 1 tablet (40 mg total) by mouth daily as needed (for acid reflux).   prasugrel 10 MG Tabs tablet Commonly known as: EFFIENT TAKE 1 TABLET ONCE DAILY. What changed:  how much to take how to take this when to take  this additional instructions   tamsulosin 0.4 MG Caps capsule Commonly known as: FLOMAX Take 1 capsule (0.4 mg total) by mouth at bedtime. What changed: when to take this           Allergies No Known Allergies  Outstanding Labs/Studies   FLP/LFTs in 8 weeks  Duration of Discharge Encounter   Greater than 30 minutes including physician time.  Signed, Reino Bellis, NP 05/22/2020, 4:44 PM  I have examined the patient and reviewed assessment and plan and discussed with patient.  Agree with above as stated.  S/p PCI for ISR optimized by OCT.  If he has recurrent restenosis, would have to consider LIMA to LAD.  COntinue aggressive secondary prevention.   Larae Grooms

## 2020-05-23 ENCOUNTER — Encounter (HOSPITAL_COMMUNITY): Payer: Self-pay | Admitting: Interventional Cardiology

## 2020-05-24 ENCOUNTER — Encounter (HOSPITAL_COMMUNITY): Payer: Medicare PPO

## 2020-05-27 ENCOUNTER — Encounter (HOSPITAL_COMMUNITY): Payer: Medicare PPO

## 2020-05-29 ENCOUNTER — Encounter (HOSPITAL_COMMUNITY): Payer: Medicare PPO

## 2020-05-31 ENCOUNTER — Encounter (HOSPITAL_COMMUNITY): Payer: Medicare PPO

## 2020-05-31 ENCOUNTER — Other Ambulatory Visit: Payer: Self-pay | Admitting: Nurse Practitioner

## 2020-05-31 DIAGNOSIS — R3911 Hesitancy of micturition: Secondary | ICD-10-CM

## 2020-05-31 DIAGNOSIS — N401 Enlarged prostate with lower urinary tract symptoms: Secondary | ICD-10-CM

## 2020-06-03 ENCOUNTER — Encounter (HOSPITAL_COMMUNITY): Payer: Medicare PPO

## 2020-06-03 ENCOUNTER — Ambulatory Visit: Payer: Medicare PPO | Admitting: Physician Assistant

## 2020-06-04 ENCOUNTER — Ambulatory Visit: Payer: Medicare PPO | Admitting: Cardiology

## 2020-06-05 ENCOUNTER — Encounter (HOSPITAL_COMMUNITY): Payer: Medicare PPO

## 2020-06-05 NOTE — Progress Notes (Cosign Needed Addendum)
Discharge Progress Report  Patient Details  Name: [name] [name] MRN: [id number] Date of Birth: 12-04-1955 Referring Provider:   Flowsheet Row CARDIAC REHAB PHASE II ORIENTATION from 04/11/2020 in [place]  Referring Provider Dr. Ellyn Hack       Number of Visits: 10   Reason for Discharge:  Early Exit:  He had chest pain and another catherization   Smoking History:  Social History   Tobacco Use  Smoking Status Never Smoker  Smokeless Tobacco Never Used    Diagnosis:  S/P drug eluting coronary stent placement  History of atherectomy  ADL [facility]:   Initial Exercise Prescription:  Initial Exercise Prescription - 04/11/20 1000      Date of Initial Exercise RX and Referring Provider   Date 04/11/20    Referring Provider Dr. Ellyn Hack    Expected Discharge Date 07/05/20      NuStep   Level 1    SPM 80    Minutes 17      Arm Ergometer   Level 1    RPM 60    Minutes 22      Prescription Details   Duration Progress to 30 minutes of continuous aerobic without signs/symptoms of physical distress      Intensity   THRR 40-80% of Max Heartrate 62-123    Ratings of Perceived Exertion 11-15      Progression   Progression Continue progressive overload as per policy without signs/symptoms or physical distress.      Resistance Training   Training Prescription Yes    Weight 3    Reps 10-15           Discharge Exercise Prescription (Final Exercise Prescription Changes):  Exercise Prescription Changes - 05/08/20 1217      Response to Exercise   Blood Pressure (Admit) 126/64    Blood Pressure (Exercise) 160/72    Blood Pressure (Exit) 102/60    Heart Rate (Admit) 83 bpm    Heart Rate (Exercise) 107 bpm    Heart Rate (Exit) 86 bpm    Rating of Perceived Exertion (Exercise) 12    Duration Continue with 30 min of aerobic exercise without signs/symptoms of physical distress.    Intensity THRR unchanged      Progression   Progression Continue  to progress workloads to maintain intensity without signs/symptoms of physical distress.      Resistance Training   Training Prescription Yes    Weight 4 lbs    Reps 10-15    Time 10 Minutes      Treadmill   MPH 1.8    Grade 0    Minutes 22    METs 2.38      NuStep   Level 3    SPM 98    Minutes 17    METs 2.1           Functional Capacity:  6 Minute Walk    Row Name 04/11/20 1011         6 Minute Walk   Phase Initial     Distance 1100 feet     Walk Time 6 minutes     # of Rest Breaks 0     MPH 2.52     METS 2.1     RPE 11     VO2 Peak 8.83     Symptoms No     Resting HR 67 bpm     Resting BP 114/66     Resting Oxygen Saturation  95 %  Exercise Oxygen Saturation  during 6 min walk 96 %     Max Ex. HR 99 bpm     Max Ex. BP 158/60     2 Minute Post BP 110/62            Psychological, QOL, Others - Outcomes: PHQ 2/9: Depression screen [facility] 2/9 05/06/2020 04/11/2020 02/13/2020 01/31/2020 10/24/2019  Decreased Interest 0 1 0 0 0  Down, Depressed, Hopeless 0 1 0 0 0  PHQ - 2 Score 0 2 0 0 0  Altered sleeping - 3 - - -  Tired, decreased energy - 2 - - -  Change in appetite - 0 - - -  Feeling bad or failure about yourself  - 1 - - -  Trouble concentrating - 0 - - -  Moving slowly or fidgety/restless - 0 - - -  Suicidal thoughts - 0 - - -  PHQ-9 Score - 8 - - -  Difficult doing work/chores - Somewhat difficult - - -    Quality of Life:  Quality of Life - 04/11/20 1020      Quality of Life   Select Quality of Life           Personal Goals: Goals established at orientation with interventions provided to work toward goal.  Personal Goals and Risk Factors at Admission - 04/11/20 0837      Core Components/Risk Factors/Patient Goals on Admission    Weight Management Yes    Intervention Weight Management: Develop a combined nutrition and exercise program designed to reach desired caloric intake, while maintaining appropriate intake of nutrient and  fiber, sodium and fats, and appropriate energy expenditure required for the weight goal.;Weight Management: Provide education and appropriate resources to help participant work on and attain dietary goals.;Weight Management/Obesity: Establish reasonable short term and long term weight goals.;Obesity: Provide education and appropriate resources to help participant work on and attain dietary goals.    Expected Outcomes Short Term: Continue to assess and modify interventions until short term weight is achieved;Long Term: Adherence to nutrition and physical activity/exercise program aimed toward attainment of established weight goal;Weight Maintenance: Understanding of the daily nutrition guidelines, which includes 25-35% calories from fat, 7% or less cal from saturated fats, less than 200mg  cholesterol, less than 1.5gm of sodium, & 5 or more servings of fruits and vegetables daily;Weight Loss: Understanding of general recommendations for a balanced deficit meal plan, which promotes 1-2 lb weight loss per week and includes a negative energy balance of [phone] kcal/d;Understanding recommendations for meals to include 15-35% energy as protein, 25-35% energy from fat, 35-60% energy from carbohydrates, less than 200mg  of dietary cholesterol, 20-35 gm of total fiber daily;Understanding of distribution of calorie intake throughout the day with the consumption of 4-5 meals/snacks;Weight Gain: Understanding of general recommendations for a high calorie, high protein meal plan that promotes weight gain by distributing calorie intake throughout the day with the consumption for 4-5 meals, snacks, and/or supplements    Improve shortness of breath with ADL's Yes    Intervention Provide education, individualized exercise plan and daily activity instruction to help decrease symptoms of SOB with activities of daily living.    Expected Outcomes Short Term: Improve cardiorespiratory fitness to achieve a reduction of symptoms when  performing ADLs;Long Term: Be able to perform more ADLs without symptoms or delay the onset of symptoms    Diabetes Yes    Intervention Provide education about signs/symptoms and action to take for hypo/hyperglycemia.;Provide education about proper nutrition, including  hydration, and aerobic/resistive exercise prescription along with prescribed medications to achieve blood glucose in normal ranges: Fasting glucose 65-99 mg/dL    Expected Outcomes Short Term: Participant verbalizes understanding of the signs/symptoms and immediate care of hyper/hypoglycemia, proper foot care and importance of medication, aerobic/resistive exercise and nutrition plan for blood glucose control.;Long Term: Attainment of HbA1C < 7%.    Stress Yes    Intervention Offer individual and/or small group education and counseling on adjustment to heart disease, stress management and health-related lifestyle change. Teach and support self-help strategies.;Refer participants experiencing significant psychosocial distress to appropriate mental health specialists for further evaluation and treatment. When possible, include family members and significant others in education/counseling sessions.    Expected Outcomes Short Term: Participant demonstrates changes in health-related behavior, relaxation and other stress management skills, ability to obtain effective social support, and compliance with psychotropic medications if prescribed.;Long Term: Emotional wellbeing is indicated by absence of clinically significant psychosocial distress or social isolation.            Personal Goals Discharge:  Goals and Risk Factor Review    Row Name 04/29/20 1255             Core Components/Risk Factors/Patient Goals Review   Personal Goals Review Weight Management/Obesity;Other;Diabetes       Review Patient is new to the program completing 6 sessions gaining 2.7 lbs since his intial visit. He has multiple risk factors for CAD and was referred  to cardiac rehab s/p stent placement and Atherectomy. He is participating in the program for risk modification. His personal goals are to lose weight and get stronger. His last A1C was 7.6% 02/07/20 managed with Metformin and both short and long acting insulin. We will continue to monitor his progress as he works toward these goals.       Expected Outcomes Patient will complete the program meeting both personal and program goals.              Exercise Goals and Review:  Exercise Goals    Row Name 04/11/20 1014 05/06/20 1103           Exercise Goals   Increase Physical Activity Yes Yes      Intervention Provide advice, education, support and counseling about physical activity/exercise needs.;Develop an individualized exercise prescription for aerobic and resistive training based on initial evaluation findings, risk stratification, comorbidities and participant's personal goals. Provide advice, education, support and counseling about physical activity/exercise needs.;Develop an individualized exercise prescription for aerobic and resistive training based on initial evaluation findings, risk stratification, comorbidities and participant's personal goals.      Expected Outcomes Short Term: Attend rehab on a regular basis to increase amount of physical activity.;Long Term: Add in home exercise to make exercise part of routine and to increase amount of physical activity.;Long Term: Exercising regularly at least 3-5 days a week. Short Term: Attend rehab on a regular basis to increase amount of physical activity.;Long Term: Add in home exercise to make exercise part of routine and to increase amount of physical activity.;Long Term: Exercising regularly at least 3-5 days a week.      Increase Strength and Stamina Yes Yes      Intervention Provide advice, education, support and counseling about physical activity/exercise needs.;Develop an individualized exercise prescription for aerobic and resistive  training based on initial evaluation findings, risk stratification, comorbidities and participant's personal goals. Provide advice, education, support and counseling about physical activity/exercise needs.;Develop an individualized exercise prescription for aerobic and resistive training based  on initial evaluation findings, risk stratification, comorbidities and participant's personal goals.      Expected Outcomes Short Term: Perform resistance training exercises routinely during rehab and add in resistance training at home;Short Term: Increase workloads from initial exercise prescription for resistance, speed, and METs.;Long Term: Improve cardiorespiratory fitness, muscular endurance and strength as measured by increased METs and functional capacity (6MWT) Short Term: Perform resistance training exercises routinely during rehab and add in resistance training at home;Short Term: Increase workloads from initial exercise prescription for resistance, speed, and METs.;Long Term: Improve cardiorespiratory fitness, muscular endurance and strength as measured by increased METs and functional capacity (6MWT)      Able to understand and use rate of perceived exertion (RPE) scale Yes Yes      Intervention Provide education and explanation on how to use RPE scale Provide education and explanation on how to use RPE scale      Expected Outcomes Short Term: Able to use RPE daily in rehab to express subjective intensity level;Long Term:  Able to use RPE to guide intensity level when exercising independently Short Term: Able to use RPE daily in rehab to express subjective intensity level;Long Term:  Able to use RPE to guide intensity level when exercising independently      Knowledge and understanding of Target Heart Rate Range (THRR) Yes Yes      Intervention Provide education and explanation of THRR including how the numbers were predicted and where they are located for reference Provide education and explanation of THRR  including how the numbers were predicted and where they are located for reference      Expected Outcomes Short Term: Able to use daily as guideline for intensity in rehab;Long Term: Able to use THRR to govern intensity when exercising independently;Short Term: Able to state/look up THRR Short Term: Able to use daily as guideline for intensity in rehab;Long Term: Able to use THRR to govern intensity when exercising independently;Short Term: Able to state/look up THRR      Able to check pulse independently Yes Yes      Intervention Provide education and demonstration on how to check pulse in carotid and radial arteries.;Review the importance of being able to check your own pulse for safety during independent exercise Provide education and demonstration on how to check pulse in carotid and radial arteries.;Review the importance of being able to check your own pulse for safety during independent exercise      Expected Outcomes Short Term: Able to explain why pulse checking is important during independent exercise;Long Term: Able to check pulse independently and accurately Short Term: Able to explain why pulse checking is important during independent exercise;Long Term: Able to check pulse independently and accurately      Understanding of Exercise Prescription Yes Yes      Intervention Provide education, explanation, and written materials on patient's individual exercise prescription Provide education, explanation, and written materials on patient's individual exercise prescription      Expected Outcomes Short Term: Able to explain program exercise prescription;Long Term: Able to explain home exercise prescription to exercise independently Short Term: Able to explain program exercise prescription;Long Term: Able to explain home exercise prescription to exercise independently             Exercise Goals Re-Evaluation:  Exercise Goals Re-Evaluation    [place] Name 05/06/20 1104             Exercise Goal  Re-Evaluation   Exercise Goals Review Increase Physical Activity;Increase Strength and Stamina;Able to  understand and use rate of perceived exertion (RPE) scale;Knowledge and understanding of Target Heart Rate Range (THRR);Able to check pulse independently;Understanding of Exercise Prescription       Comments Pt has attended 8 exercise sessions. He maintains a positive attitude and is accepting of workload increases. He currently exercises at 2.1 METs on the stepper. Will continue to monitor and progress as able.       Expected Outcomes Through exercise at rehab and by engaging in a home exercise program the patient will reach their goals.              Nutrition & Weight - Outcomes:  Pre Biometrics - 04/22/20 1333      Pre Biometrics   Weight 108 kg    BMI (Calculated) 35.67            Nutrition:  Nutrition Therapy & Goals - 04/29/20 1253      Personal Nutrition Goals   Comments Patient scored 30 on his diet assessment. He says he is following a low fat, low carbohydrate, sugar diet. We will continue to provide heart healthy nutritional education through handouts.      Intervention Plan   Intervention Nutrition handout(s) given to patient.           Nutrition Discharge:  Nutrition Assessments - 04/11/20 0843      MEDFICTS Scores   Pre Score 30           Education Questionnaire Score:  Knowledge Questionnaire Score - 04/11/20 0841      Knowledge Questionnaire Score   Pre Score 24/24          Patient had to be discharged early due to chest pain during exercise. He had another heart catheterization.

## 2020-06-05 NOTE — Progress Notes (Signed)
Cardiology Office Note  Date: 06/06/2020   ID: [name] [name], [name] [name] 09-Mar-1956, MRN [id number]  PCP:  Chevis Pretty, FNP  Cardiologist:  Rozann Lesches, MD Electrophysiologist:  None   Chief Complaint: Follow-up PCI/stent placement/accelerating angina  History of Present Illness: [name] [name] is a 65 y.o. male with a history of CAD/MI, HTN, HLD, DM2.  Last seen by Dr. Domenic Polite 05/16/2020.  Had been participating in cardiac rehab.  Rehab was called due to recurring anginal symptoms in early February.  Previous PCI November 2021.  Have been doing well on medical therapy.  He stated he was having recurrent anginal with other activities such as walking from parking lot to the visit that morning.  Describes symptoms as similar to preintervention the prior year.  Up titration of Coreg was discussed as well as relook angiography to reevaluate coronary anatomy given symptoms.  Subsequent relook cardiac catheterization on 05/22/2020: See report below. Had ISR  90% distal LM to proximal LAD. Treated with wolverine then balloon. Dissection noted by OCT past edge of old stent.  DES successfully placed. Mid LAD - 2 lesion ISR 75% with scoring balloon. Diffuse distal and small vessel disease in LAD and OM territories. Aggressive medical therapy / secondary prevention recommended.  He is here post follow-up from recent PCI.  States he just feels tired all the time.  He relates it to increasing atorvastatin and carvedilol dosages.  He denies any anginal symptoms or nitroglycerin use.  He is eager to restart cardiac rehab.  He denies any  DOE or shortness of breath.  He has not been very active since the cardiac catheterization and stent placement/PCI.  Denies any palpitations or arrhythmias, orthostatic symptoms, CVA or TIA-like symptoms, bleeding, denies any claudication-like symptoms, DVT or PE-like symptoms, or lower extremity edema.  Past Medical History:  Diagnosis Date  . Anxiety   .  Coronary atherosclerosis of native coronary artery    DES distal circumflex 2005; DES LAD/diagonal bifurcation 09/2010; DES ostial LAD and DES left PL 01/2020  . Essential hypertension   . GERD (gastroesophageal reflux disease)   . History of kidney stones   . Mixed hyperlipidemia   . Myocardial infarction ([place])    Anterolateral with VF arrest 7/12  . Type 2 diabetes mellitus ([place])     Past Surgical History:  Procedure Laterality Date  . CARDIAC CATHETERIZATION  2012  . CAROTID STENT     stents x 2   . CHOLECYSTECTOMY N/A 12/04/2015   Procedure: LAPAROSCOPIC CHOLECYSTECTOMY;  Surgeon: Aviva Signs, MD;  Location: [facility];  Service: General;  Laterality: N/A;  . CHOLECYSTECTOMY, LAPAROSCOPIC  12/05/2015  . CORONARY ANGIOPLASTY  2012   STENT X 1 2012, STENT X 1 YRS BEFORE  . CORONARY ATHERECTOMY N/A 02/09/2020   Procedure: CORONARY ATHERECTOMY;  Surgeon: Wellington Hampshire, MD;  Location: [facility];  Service: Cardiovascular;  Laterality: N/A;  . CORONARY STENT INTERVENTION N/A 02/09/2020   Procedure: CORONARY STENT INTERVENTION;  Surgeon: Wellington Hampshire, MD;  Location: [facility];  Service: Cardiovascular;  Laterality: N/A;  . CORONARY STENT INTERVENTION N/A 05/22/2020   Procedure: CORONARY STENT INTERVENTION;  Surgeon: Jettie Booze, MD;  Location: [facility];  Service: Cardiovascular;  Laterality: N/A;  . HYDROCELE EXCISION Bilateral 06/02/2017   Procedure: HYDROCELECTOMY ADULT;  Surgeon: Ceasar Mons, MD;  Location: [facility];  Service: Urology;  Laterality: Bilateral;  ONLY NEEDS 45 MIN  . INGUINAL HERNIA REPAIR  RIGHT GROIN  . INTRAVASCULAR IMAGING/OCT N/A 05/22/2020   Procedure: INTRAVASCULAR IMAGING/OCT;  Surgeon: Jettie Booze, MD;  Location: [facility];  Service: Cardiovascular;  Laterality: N/A;  . INTRAVASCULAR ULTRASOUND/IVUS N/A 02/09/2020   Procedure: Intravascular Ultrasound/IVUS;  Surgeon: Wellington Hampshire, MD;   Location: [facility];  Service: Cardiovascular;  Laterality: N/A;  . KNEE ARTHROSCOPY Left 05/23/2019   Procedure: LEFT KNEE ARTHROSCOPY AND DEBRIDEMENT PARTIAL MEDIAL MENISECTOMY;  Surgeon: Newt Minion, MD;  Location: [facility];  Service: Orthopedics;  Laterality: Left;  . LEFT HEART CATH AND CORONARY ANGIOGRAPHY N/A 02/07/2020   Procedure: LEFT HEART CATH AND CORONARY ANGIOGRAPHY;  Surgeon: Leonie Man, MD;  Location: [facility];  Service: Cardiovascular;  Laterality: N/A;  . LEFT HEART CATH AND CORONARY ANGIOGRAPHY N/A 05/22/2020   Procedure: LEFT HEART CATH AND CORONARY ANGIOGRAPHY;  Surgeon: Jettie Booze, MD;  Location: [facility];  Service: Cardiovascular;  Laterality: N/A;  . TRIGGER FINGER RELEASE Right 01/08/2015   Procedure: RELEASE TRIGGER FINGER/A-1 PULLEY RIGHT MIDDLE FINGER;  Surgeon: Daryll Brod, MD;  Location: [facility];  Service: Orthopedics;  Laterality: Right;    Current Outpatient Medications  Medication Sig Dispense Refill  . ACCU-CHEK AVIVA PLUS test strip USE TO CHECK BLOOD SUGAR UP TO 4 TIMES DAILY. 100 strip 10  . Accu-Chek Softclix Lancets lancets USE TO CHECK BLOOD SUGAR UP TO 4 TIMES DAILY. 100 each 0  . aspirin 81 MG tablet Take 81 mg by mouth daily.    Marland Kitchen atorvastatin (LIPITOR) 80 MG tablet Take 1 tablet (80 mg total) by mouth daily. 90 tablet 3  . blood glucose meter kit and supplies Dispense based on patient and insurance preference. Use up to four times daily as directed. (FOR ICD-10 E10.9, E11.9). 1 each 0  . carvedilol (COREG) 12.5 MG tablet Take 1.5 tablets (18.75 mg total) by mouth 2 (two) times daily. 270 tablet 3  . cetirizine (ZYRTEC) 10 MG tablet Take 10 mg by mouth daily.    . Cinnamon 500 MG TABS Take 1,000 mg by mouth 2 (two) times daily.     Marland Kitchen CRANBERRY PO Take 4,200 mg by mouth 2 (two) times daily.     Marland Kitchen GLOBAL EASE INJECT PEN NEEDLES 31G X 8 MM MISC Use up to 8 times a day Dx E11.8  800 each 3  . Insulin Aspart FlexPen 100 UNIT/ML SOPN INJECT UP TO 45 UNITS EVERY DAY (Patient taking differently: Inject 45-75 Units into the skin 2 (two) times daily with a meal. INJECT 45-75 units subq with breakfast and supper. Sliding scale) 15 mL 11  . LANTUS SOLOSTAR 100 UNIT/ML Solostar Pen INJECT 44 UNITS EVERY MORNING AND 75 UNITS EVERY EVENING. (Patient taking differently: Inject into the skin See admin instructions. INJECT 44 UNITS EVERY MORNING AND 75 UNITS EVERY EVENING.) 30 mL 0  . lisinopril-hydrochlorothiazide (ZESTORETIC) 20-12.5 MG tablet Take 1 tablet by mouth daily. 180 tablet 1  . metFORMIN (GLUCOPHAGE) 500 MG tablet Take 2 tablets (1,000 mg total) by mouth 2 (two) times daily. 120 tablet 1  . nitroGLYCERIN (NITROSTAT) 0.4 MG SL tablet Place 1 tablet (0.4 mg total) under the tongue every 5 (five) minutes as needed for chest pain. 25 tablet 3  . prasugrel (EFFIENT) 10 MG TABS tablet TAKE 1 TABLET ONCE DAILY. (Patient taking differently: Take 10 mg by mouth daily.) 90 tablet 1  . Semaglutide,0.25 or 0.5MG/DOS, (OZEMPIC, 0.25 OR 0.5 MG/DOSE,)  2 MG/1.5ML SOPN Inject 2 mg into the skin once a week. (Patient taking differently: Inject 0.5 mg into the skin once a week.) 12 mL 1  . tamsulosin (FLOMAX) 0.4 MG CAPS capsule TAKE 1 CAPSULE BY MOUTH AT BEDTIME. 30 capsule 0  . isosorbide mononitrate (IMDUR) 30 MG 24 hr tablet Take 1 tablet (30 mg total) by mouth daily. 90 tablet 3  . pantoprazole (PROTONIX) 40 MG tablet Take 1 tablet (40 mg total) by mouth daily as needed (for acid reflux). 90 tablet 3   Current Facility-Administered Medications  Medication Dose Route Frequency Provider Last Rate Last Admin  . sodium chloride flush (NS) 0.9 % injection 3 mL  3 mL Intravenous Q12H Satira Sark, MD       Allergies:  Patient has no known allergies.   Social History: The patient  reports that he has never smoked. He has never used smokeless tobacco. He reports that he does not drink  alcohol and does not use drugs.   Family History: The patient's family history includes Dementia in his father; Diabetes in his father and maternal grandfather; Hyperlipidemia in his father and mother; Hypertension in his father and mother.   ROS:  Please see the history of present illness. Otherwise, complete review of systems is positive for none.  All other systems are reviewed and negative.   Physical Exam: VS:  BP 136/70   Pulse 80   Ht _0  (1.753 m)   Wt 239 lb (108.4 kg)   SpO2 96%   BMI 35.29 kg/m , BMI Body mass index is 35.29 kg/m.  Wt Readings from Last 3 Encounters:  06/06/20 239 lb (108.4 kg)  05/16/20 238 lb (108 kg)  05/06/20 237 lb 14 oz (107.9 kg)    General: Obese patient appears comfortable at rest. Neck: Supple, no elevated JVP or carotid bruits, no thyromegaly. Lungs: Clear to auscultation, nonlabored breathing at rest. Cardiac: Regular rate and rhythm, no S3 or significant systolic murmur, no pericardial rub. Extremities: No pitting edema, distal pulses 2+. Skin: Warm and dry.  Right radial access site clean and dry with 2+ pulses Musculoskeletal: No kyphosis. Neuropsychiatric: Alert and oriented x3, affect grossly appropriate.  ECG:  EKG 05/22/2020 normal sinus rhythm rate of 75.  Recent Labwork: 05/06/2020: ALT 33; AST 24; BUN 19; Creatinine, Ser 1.07; Hemoglobin 13.9; Platelets 275; Potassium 4.6; Sodium 139     Component Value Date/Time   CHOL 95 (L) 05/06/2020 1125   TRIG 145 05/06/2020 1125   HDL 23 (L) 05/06/2020 1125   CHOLHDL 4.1 05/06/2020 1125   CHOLHDL 5.6 02/08/2020 0626   VLDL 38 02/08/2020 0626   LDLCALC 47 05/06/2020 1125    Other Studies Reviewed Today:   Cardiac catheterization 05/22/2020 INTRAVASCULAR IMAGING/OCT  CORONARY STENT INTERVENTION  LEFT HEART CATH AND CORONARY ANGIOGRAPHY    Conclusion    Prox RCA lesion is 70% stenosed.  Dist LAD lesion is 60% stenosed.  1st Mrg lesion is 85% stenosed.  1st LPL-2  lesion is 80% stenosed.  Previously placed 1st LPL-1 drug eluting stent is widely patent.  LPAV-2 lesion is 100% stenosed.  Dist LM to Prox LAD lesion is 90% stenosed, instent restenosis. Treated with 3.25 mm Wolverine and then 4.0 [place] balloon. Dissection noted by OCT past the edge of old stent. A drug-eluting stent was successfully placed using a SYNERGY XD 3.50X12., post dilated to 4.0 mm.  Post intervention, there is a 0% residual stenosis. Stent optimized with OCT.  Mid  LAD-1 lesion is 20% stenosed.  Mid LAD-2 lesion is 75% stenosed. In-stent restenosis. Scoring balloon angioplasty was performed using a BALLOON WOLVERINE 3.25X10.  Post intervention, there is a 0% residual stenosis.  The left ventricular systolic function is normal.  LV end diastolic pressure is normal.  The left ventricular ejection fraction is 55-65% by visual estimate.  There is no aortic valve stenosis.  A drug-eluting stent was successfully placed using a SYNERGY XD 3.50X12.   Continue dual antiplatelet therapy along with aggressive secondary prevention.   Diffuse, distal and small vessel disease in the LAD and OM territories.  Aggressive medical therapy.   Diagnostic Dominance: Left    Intervention       Echocardiogram 02/08/2020: 1. Left ventricular ejection fraction, by estimation, is 60 to 65%. The  left ventricle has normal function. The left ventricle has no regional  wall motion abnormalities. Left ventricular diastolic parameters are  consistent with Grade I diastolic  dysfunction (impaired relaxation).  2. Right ventricular systolic function is normal. The right ventricular  size is normal. Tricuspid regurgitation signal is inadequate for assessing  PA pressure.  3. The mitral valve is normal in structure. No evidence of mitral valve  regurgitation. No evidence of mitral stenosis.  4. The aortic valve is tricuspid. Aortic valve regurgitation is not  visualized. Mild aortic  valve sclerosis is present, with no evidence of  aortic valve stenosis.  5. Aortic dilatation noted. There is mild dilatation of the ascending  aorta, measuring 40 mm.   PCI 02/09/2020:  Prox RCA lesion is 70% stenosed.  Dist LM to Prox LAD lesion is 95% stenosed.  Dist LAD lesion is 60% stenosed.  Mid LAD lesion is 20% stenosed.  Prox LAD to Mid LAD lesion is 20% stenosed.  1st Mrg lesion is 85% stenosed.  1st LPL-1 lesion is 95% stenosed.  1st LPL-2 lesion is 80% stenosed.  LPAV-1 lesion is 30% stenosed.  LPAV-2 lesion is 100% stenosed.  Post intervention, there is a 0% residual stenosis.  A drug-eluting stent was successfully placed using a SYNERGY XD 3.50X12.  Post intervention, there is a 0% residual stenosis.  A drug-eluting stent was successfully placed using a SYNERGY XD 2.25X16.  1. Successful IVUS guided orbital atherectomy and drug eluting stent placement to the ostial LAD. 2. Successful angioplasty and drug-eluting stent placement to first PL branch of the left circumflex.   Assessment and Plan:  1. CAD in native artery   2. Mixed hyperlipidemia   3. Type 2 diabetes mellitus with complication, without long-term current use of insulin ([place])   4. Gastroesophageal reflux disease without esophagitis    1. CAD in native artery Recent relook cardiac catheterization on 05/22/2020 for continuing angina.  See cath report above.  Had ISR  90% distal LM to proximal LAD. Treated with wolverine then balloon. Dissection noted by OCT past edge of old stent.  DES successfully placed. Mid LAD - 2 lesion ISR 75% with scoring balloon. Diffuse distal and small vessel disease in LAD and OM territories. Aggressive medical therapy / secondary prevention recommended.  Currently denies any anginal symptoms or nitroglycerin use.  States he just feels tired all the time.  Continue aspirin 81 mg daily.  Continue Imdur 30 mg daily.  Continue nitroglycerin sublingual as needed.   Continue Effient 10 mg daily.  Continue carvedilol 18.75 mg p.o. twice daily.  2. Mixed hyperlipidemia Recent increase in atorvastatin to 80 mg daily for aggressive secondary prevention.  Recent lipid panel total  cholesterol 95, triglycerides 145, HDL 23, LDL 47.  3. Type 2 diabetes mellitus with complication, without long-term current use of insulin ([facility]) Recent glucose on 05/22/2020 was 154.  Hemoglobin A1c 7.1%.  Follow with PCP for management  4. Gastroesophageal reflux disease without esophagitis Please refill Protonix per patient request.  Follow with PCP for GERD  Medication Adjustments/Labs and Tests Ordered: Current medicines are reviewed at length with the patient today.  Concerns regarding medicines are outlined above.   Disposition: Follow-up with Dr. Domenic Polite or APP 6 months  Signed, Levell July, NP 06/06/2020 9:00 AM    [facility], [place], [place] [address] Phone: [phone]; Fax: [phone]

## 2020-06-05 NOTE — Addendum Note (Signed)
Encounter addended by: Lorin Glass on: 06/05/2020 2:07 PM  Actions taken: Clinical Note Signed, Episode resolved

## 2020-06-06 ENCOUNTER — Ambulatory Visit: Payer: Medicare PPO | Admitting: Family Medicine

## 2020-06-06 ENCOUNTER — Encounter: Payer: Self-pay | Admitting: Family Medicine

## 2020-06-06 VITALS — BP 136/70 | HR 80 | Ht 69.0 in | Wt 239.0 lb

## 2020-06-06 DIAGNOSIS — K219 Gastro-esophageal reflux disease without esophagitis: Secondary | ICD-10-CM

## 2020-06-06 DIAGNOSIS — I2510 Atherosclerotic heart disease of native coronary artery without angina pectoris: Secondary | ICD-10-CM | POA: Diagnosis not present

## 2020-06-06 DIAGNOSIS — E118 Type 2 diabetes mellitus with unspecified complications: Secondary | ICD-10-CM

## 2020-06-06 DIAGNOSIS — I200 Unstable angina: Secondary | ICD-10-CM

## 2020-06-06 DIAGNOSIS — E782 Mixed hyperlipidemia: Secondary | ICD-10-CM

## 2020-06-06 MED ORDER — PANTOPRAZOLE SODIUM 40 MG PO TBEC: 40.0000 mg | DELAYED_RELEASE_TABLET | Freq: Every day | ORAL | 3 refills | Status: DC | PRN

## 2020-06-06 MED ORDER — ISOSORBIDE MONONITRATE ER 30 MG PO TB24: 30.0000 mg | ORAL_TABLET | Freq: Every day | ORAL | 3 refills | Status: DC

## 2020-06-06 NOTE — Patient Instructions (Addendum)

## 2020-06-07 ENCOUNTER — Encounter (HOSPITAL_COMMUNITY): Payer: Medicare PPO

## 2020-06-07 ENCOUNTER — Other Ambulatory Visit: Payer: Self-pay | Admitting: Nurse Practitioner

## 2020-06-07 DIAGNOSIS — E1159 Type 2 diabetes mellitus with other circulatory complications: Secondary | ICD-10-CM

## 2020-06-10 ENCOUNTER — Encounter (HOSPITAL_COMMUNITY): Payer: Medicare PPO

## 2020-06-12 ENCOUNTER — Encounter (HOSPITAL_COMMUNITY): Payer: Medicare PPO

## 2020-06-13 ENCOUNTER — Other Ambulatory Visit: Payer: Self-pay | Admitting: Nurse Practitioner

## 2020-06-13 DIAGNOSIS — E1159 Type 2 diabetes mellitus with other circulatory complications: Secondary | ICD-10-CM

## 2020-06-13 DIAGNOSIS — Z794 Long term (current) use of insulin: Secondary | ICD-10-CM

## 2020-06-14 ENCOUNTER — Encounter (HOSPITAL_COMMUNITY): Payer: Medicare PPO

## 2020-06-17 ENCOUNTER — Other Ambulatory Visit: Payer: Self-pay

## 2020-06-17 ENCOUNTER — Encounter (HOSPITAL_COMMUNITY)
Admission: RE | Admit: 2020-06-17 | Discharge: 2020-06-17 | Disposition: A | Discharge status: Home / Self Care | Payer: Medicare PPO | Source: Ambulatory Visit | Attending: Cardiology | Admitting: Cardiology

## 2020-06-17 ENCOUNTER — Encounter (HOSPITAL_COMMUNITY): Payer: Medicare PPO

## 2020-06-17 VITALS — BP 112/60 | HR 84 | Ht 69.0 in | Wt 241.6 lb

## 2020-06-17 DIAGNOSIS — Z48812 Encounter for surgical aftercare following surgery on the circulatory system: Secondary | ICD-10-CM | POA: Diagnosis not present

## 2020-06-17 DIAGNOSIS — Z95820 Peripheral vascular angioplasty status with implants and grafts: Secondary | ICD-10-CM | POA: Diagnosis not present

## 2020-06-17 DIAGNOSIS — Z955 Presence of coronary angioplasty implant and graft: Secondary | ICD-10-CM | POA: Diagnosis not present

## 2020-06-17 DIAGNOSIS — Z79899 Other long term (current) drug therapy: Secondary | ICD-10-CM | POA: Diagnosis not present

## 2020-06-17 DIAGNOSIS — Z9889 Other specified postprocedural states: Secondary | ICD-10-CM | POA: Insufficient documentation

## 2020-06-17 DIAGNOSIS — Z7982 Long term (current) use of aspirin: Secondary | ICD-10-CM | POA: Diagnosis not present

## 2020-06-17 DIAGNOSIS — Z7984 Long term (current) use of oral hypoglycemic drugs: Secondary | ICD-10-CM | POA: Insufficient documentation

## 2020-06-17 DIAGNOSIS — Z794 Long term (current) use of insulin: Secondary | ICD-10-CM | POA: Diagnosis not present

## 2020-06-17 LAB — GLUCOSE, CAPILLARY: Glucose-Capillary: 161 mg/dL — ABNORMAL HIGH (ref 70–99)

## 2020-06-17 NOTE — Progress Notes (Signed)
Cardiac/Pulmonary Rehab Medication Review by a Pharmacist  Does the patient  feel that his/her medications are working for him/her?  yes  Has the patient been experiencing any side effects to the medications prescribed?  no  Does the patient measure his/her own blood pressure or blood glucose at home?  yes   Does the patient have any problems obtaining medications due to transportation or finances?   no  Understanding of regimen: excellent Understanding of indications: excellent Potential of compliance: excellent  Questions asked to Determine Patient Understanding of Medication Regimen:  1. What is the name of the medication?  2. What is the medication used for?  3. When should it be taken?  4. How much should be taken?  5. How will you take it?  6. What side effects should you report?  Understanding Defined as: Excellent: All questions above are correct Good: Questions 1-4 are correct Fair: Questions 1-2 are correct  Poor: 1 or none of the above questions are correct   Pharmacist comments: Previously saw patient in January- since then patient's atorvastatin and carvedilol doses have increased.      Ramond Craver 06/17/2020 1:31 PM

## 2020-06-17 NOTE — Progress Notes (Signed)
Cardiac Individual Treatment Plan  Patient Details  Name: [name] [name] MRN: [id number] Date of Birth: 09-13-1955 Referring Provider:   Flowsheet Row CARDIAC REHAB PHASE II ORIENTATION from 06/17/2020 in [facility]  Referring Provider Domenic Polite      Initial Encounter Date:  [place] PHASE II ORIENTATION from 06/17/2020 in [facility]  Date 06/17/20      Visit Diagnosis: Status post angioplasty with stent  Patient's Home Medications on Admission:  Current Outpatient Medications:  .  Accu-Chek Softclix Lancets lancets, USE TO CHECK BLOOD SUGAR UP TO 4 TIMES DAILY., Disp: 100 each, Rfl: 0 .  aspirin 81 MG tablet, Take 81 mg by mouth daily., Disp: , Rfl:  .  atorvastatin (LIPITOR) 80 MG tablet, Take 1 tablet (80 mg total) by mouth daily., Disp: 90 tablet, Rfl: 3 .  blood glucose meter kit and supplies, Dispense based on patient and insurance preference. Use up to four times daily as directed. (FOR ICD-10 E10.9, E11.9)., Disp: 1 each, Rfl: 0 .  carvedilol (COREG) 12.5 MG tablet, Take 1.5 tablets (18.75 mg total) by mouth 2 (two) times daily., Disp: 270 tablet, Rfl: 3 .  cetirizine (ZYRTEC) 10 MG tablet, Take 10 mg by mouth daily., Disp: , Rfl:  .  Cinnamon 500 MG TABS, Take 1,000 mg by mouth 2 (two) times daily. , Disp: , Rfl:  .  CRANBERRY PO, Take 4,200 mg by mouth 2 (two) times daily. , Disp: , Rfl:  .  GLOBAL EASE INJECT PEN NEEDLES 31G X 8 MM MISC, Use up to 8 times a day Dx E11.8, Disp: 800 each, Rfl: 3 .  glucose blood (ACCU-CHEK AVIVA PLUS) test strip, Check BS up to 4 times a day dx E11.8, Disp: 100 strip, Rfl: 11 .  Insulin Aspart FlexPen 100 UNIT/ML SOPN, INJECT UP TO 45 UNITS EVERY DAY (Patient taking differently: Inject 45-75 Units into the skin 2 (two) times daily with a meal. INJECT 45-75 units subq with breakfast and supper. Sliding scale), Disp: 15 mL, Rfl: 11 .  isosorbide mononitrate (IMDUR) 30 MG 24 hr  tablet, Take 1 tablet (30 mg total) by mouth daily., Disp: 90 tablet, Rfl: 3 .  LANTUS SOLOSTAR 100 UNIT/ML Solostar Pen, INJECT 44 UNITS EVERY MORNING AND 75 UNITS EVERY EVENING., Disp: 30 mL, Rfl: 0 .  lisinopril-hydrochlorothiazide (ZESTORETIC) 20-12.5 MG tablet, Take 1 tablet by mouth daily., Disp: 180 tablet, Rfl: 1 .  metFORMIN (GLUCOPHAGE) 500 MG tablet, Take 2 tablets (1,000 mg total) by mouth 2 (two) times daily., Disp: 120 tablet, Rfl: 1 .  nitroGLYCERIN (NITROSTAT) 0.4 MG SL tablet, Place 1 tablet (0.4 mg total) under the tongue every 5 (five) minutes as needed for chest pain., Disp: 25 tablet, Rfl: 3 .  OZEMPIC, 0.25 OR 0.5 MG/DOSE, 2 MG/1.5ML SOPN, INJECT 0.375 MLS (0.5 MG TOTAL) INTO THE SKIN ONCE A WEEK., Disp: 1.5 mL, Rfl: 0 .  pantoprazole (PROTONIX) 40 MG tablet, Take 1 tablet (40 mg total) by mouth daily as needed (for acid reflux)., Disp: 90 tablet, Rfl: 3 .  prasugrel (EFFIENT) 10 MG TABS tablet, TAKE 1 TABLET ONCE DAILY. (Patient taking differently: Take 10 mg by mouth daily.), Disp: 90 tablet, Rfl: 1 .  tamsulosin (FLOMAX) 0.4 MG CAPS capsule, TAKE 1 CAPSULE BY MOUTH AT BEDTIME., Disp: 30 capsule, Rfl: 0  Current Facility-Administered Medications:  .  sodium chloride flush (NS) 0.9 % injection 3 mL, 3 mL, Intravenous, Q12H, Satira Sark, MD  Past Medical History: Past Medical History:  Diagnosis Date  . Anxiety   . Coronary atherosclerosis of native coronary artery    DES distal circumflex 2005; DES LAD/diagonal bifurcation 09/2010; DES ostial LAD and DES left PL 01/2020  . Essential hypertension   . GERD (gastroesophageal reflux disease)   . History of kidney stones   . Mixed hyperlipidemia   . Myocardial infarction ([place])    Anterolateral with VF arrest 7/12  . Type 2 diabetes mellitus ([facility])     Tobacco Use: Social History   Tobacco Use  Smoking Status Never Smoker  Smokeless Tobacco Never Used    Labs: Recent Review Flowsheet Data    Labs for ITP  Cardiac and Pulmonary Rehab Latest Ref Rng & Units 10/24/2019 01/31/2020 02/07/2020 02/08/2020 05/06/2020   Cholestrol 100 - 199 mg/dL 97(L) 109 - 89 95(L)   LDLCALC 0 - 99 mg/dL 53 54 - 35 47   HDL >39 mg/dL 18(L) 21(L) - 16(L) 23(L)   Trlycerides 0 - 149 mg/dL 145 209(H) - 188(H) 145   Hemoglobin A1c <7.0 % 8.1(H) 7.6(H) 7.6(H) - 7.1(H)   TCO2 0 - 100 mmol/L - - - - -      Capillary Blood Glucose: Lab Results  Component Value Date   GLUCAP 161 (H) 06/17/2020   GLUCAP 154 (H) 05/22/2020   GLUCAP 192 (H) 05/22/2020   GLUCAP 130 (H) 04/11/2020   GLUCAP 226 (H) 02/10/2020    POCT Glucose    Row Name 06/17/20 1310             POCT Blood Glucose   Pre-Exercise 161 mg/dL              Exercise Target Goals: Exercise Program Goal: Individual exercise prescription set using results from initial 6 min walk test and THRR while considering  patient's activity barriers and safety.   Exercise Prescription Goal: Starting with aerobic activity 30 plus minutes a day, 3 days per week for initial exercise prescription. Provide home exercise prescription and guidelines that participant acknowledges understanding prior to discharge.  Activity Barriers & Risk Stratification:  Activity Barriers & Cardiac Risk Stratification - 06/17/20 1255      Activity Barriers & Cardiac Risk Stratification   Activity Barriers Deconditioning;Shortness of Breath;Joint Problems;Chest Pain/Angina    Cardiac Risk Stratification High           6 Minute Walk:  6 Minute Walk    Row Name 06/17/20 1404         6 Minute Walk   Phase Initial     Distance 1175 feet     Walk Time 6 minutes     # of Rest Breaks 0     MPH 2.2     METS 2.54     RPE 12     VO2 Peak 8.9     Symptoms Yes (comment)     Comments chest discomfort 4/10. He says that he feels like it is in his lungs/airway rather than his heart. Does not feel similar to any pain he had with cardiac event     Resting HR 84 bpm     Resting BP  112/60     Resting Oxygen Saturation  94 %     Exercise Oxygen Saturation  during 6 min walk 97 %     Max Ex. HR 100 bpm     Max Ex. BP 146/70     2 Minute Post BP 130/70  Oxygen Initial Assessment:   Oxygen Re-Evaluation:   Oxygen Discharge (Final Oxygen Re-Evaluation):   Initial Exercise Prescription:  Initial Exercise Prescription - 06/17/20 1400      Date of Initial Exercise RX and Referring Provider   Date 06/17/20    Referring Provider Domenic Polite    Expected Discharge Date 09/06/20      Treadmill   MPH 1.5    Grade 0    Minutes 17      NuStep   Level 2    SPM 80    Minutes 22      Prescription Details   Frequency (times per week) 3    Duration Progress to 30 minutes of continuous aerobic without signs/symptoms of physical distress      Intensity   THRR 40-80% of Max Heartrate 62-123    Ratings of Perceived Exertion 11-13      Resistance Training   Training Prescription Yes    Weight 4 lbs    Reps 10-15           Perform Capillary Blood Glucose checks as needed.  Exercise Prescription Changes:  Exercise Prescription Changes    Row Name 04/22/20 1300 04/26/20 1000 05/06/20 1100 05/08/20 1217       Response to Exercise   Blood Pressure (Admit) 108/68 -- 130/68 126/64    Blood Pressure (Exercise) 144/76 -- 128/76 160/72    Blood Pressure (Exit) 102/64 -- 110/62 102/60    Heart Rate (Admit) 69 bpm -- 80 bpm 83 bpm    Heart Rate (Exercise) 117 bpm -- 105 bpm 107 bpm    Heart Rate (Exit) 87 bpm -- 89 bpm 86 bpm    Rating of Perceived Exertion (Exercise) 13 -- 13 12    Duration Continue with 30 min of aerobic exercise without signs/symptoms of physical distress. -- Continue with 30 min of aerobic exercise without signs/symptoms of physical distress. Continue with 30 min of aerobic exercise without signs/symptoms of physical distress.    Intensity THRR unchanged -- THRR unchanged THRR unchanged         Progression   Progression Continue  to progress workloads to maintain intensity without signs/symptoms of physical distress. -- Continue to progress workloads to maintain intensity without signs/symptoms of physical distress. Continue to progress workloads to maintain intensity without signs/symptoms of physical distress.         Resistance Training   Training Prescription Yes -- Yes Yes    Weight 3 lbs -- 4 lbs 4 lbs    Reps 10-15 -- 10-15 10-15    Time 10 Minutes -- 10 Minutes 10 Minutes         Treadmill   MPH -- -- -- 1.8    Grade -- -- -- 0    Minutes -- -- -- 22    METs -- -- -- 2.38         NuStep   Level 1 -- 3 3    SPM 106 -- 105 98    Minutes 22 -- 22 17    METs 2.1 -- 2.1 2.1         Arm Ergometer   Level 1 -- 1.5 --    RPM 63 -- 67 --    Minutes 22 -- 17 --    METs 2.1 -- 2 --         Home Exercise Plan   Plans to continue exercise at -- Home (comment) -- --    Frequency -- Add 3 additional  days to program exercise sessions. -- --    Initial Home Exercises Provided -- 04/26/20 -- --           Exercise Comments:  Exercise Comments    Row Name 04/26/20 1018           Exercise Comments Reviewed home exercise program with patient. He is currently walking 30 minutes per day outside of rehab. Will start light stretching and resistance training.              Exercise Goals and Review:  Exercise Goals    Row Name 05/06/20 1103 06/17/20 1412           Exercise Goals   Increase Physical Activity Yes Yes      Intervention Provide advice, education, support and counseling about physical activity/exercise needs.;Develop an individualized exercise prescription for aerobic and resistive training based on initial evaluation findings, risk stratification, comorbidities and participant's personal goals. Provide advice, education, support and counseling about physical activity/exercise needs.;Develop an individualized exercise prescription for aerobic and resistive training based on initial  evaluation findings, risk stratification, comorbidities and participant's personal goals.      Expected Outcomes Short Term: Attend rehab on a regular basis to increase amount of physical activity.;Long Term: Add in home exercise to make exercise part of routine and to increase amount of physical activity.;Long Term: Exercising regularly at least 3-5 days a week. Short Term: Attend rehab on a regular basis to increase amount of physical activity.;Long Term: Add in home exercise to make exercise part of routine and to increase amount of physical activity.;Long Term: Exercising regularly at least 3-5 days a week.      Increase Strength and Stamina Yes Yes      Intervention Provide advice, education, support and counseling about physical activity/exercise needs.;Develop an individualized exercise prescription for aerobic and resistive training based on initial evaluation findings, risk stratification, comorbidities and participant's personal goals. Provide advice, education, support and counseling about physical activity/exercise needs.;Develop an individualized exercise prescription for aerobic and resistive training based on initial evaluation findings, risk stratification, comorbidities and participant's personal goals.      Expected Outcomes Short Term: Perform resistance training exercises routinely during rehab and add in resistance training at home;Short Term: Increase workloads from initial exercise prescription for resistance, speed, and METs.;Long Term: Improve cardiorespiratory fitness, muscular endurance and strength as measured by increased METs and functional capacity (6MWT) Short Term: Perform resistance training exercises routinely during rehab and add in resistance training at home;Short Term: Increase workloads from initial exercise prescription for resistance, speed, and METs.;Long Term: Improve cardiorespiratory fitness, muscular endurance and strength as measured by increased METs and  functional capacity (6MWT)      Able to understand and use rate of perceived exertion (RPE) scale Yes Yes      Intervention Provide education and explanation on how to use RPE scale Provide education and explanation on how to use RPE scale      Expected Outcomes Short Term: Able to use RPE daily in rehab to express subjective intensity level;Long Term:  Able to use RPE to guide intensity level when exercising independently Short Term: Able to use RPE daily in rehab to express subjective intensity level;Long Term:  Able to use RPE to guide intensity level when exercising independently      Knowledge and understanding of Target Heart Rate Range (THRR) Yes Yes      Intervention Provide education and explanation of THRR including how the numbers were predicted and where  they are located for reference Provide education and explanation of THRR including how the numbers were predicted and where they are located for reference      Expected Outcomes Short Term: Able to use daily as guideline for intensity in rehab;Long Term: Able to use THRR to govern intensity when exercising independently;Short Term: Able to state/look up THRR Short Term: Able to use daily as guideline for intensity in rehab;Long Term: Able to use THRR to govern intensity when exercising independently;Short Term: Able to state/look up THRR      Able to check pulse independently Yes Yes      Intervention Provide education and demonstration on how to check pulse in carotid and radial arteries.;Review the importance of being able to check your own pulse for safety during independent exercise Provide education and demonstration on how to check pulse in carotid and radial arteries.;Review the importance of being able to check your own pulse for safety during independent exercise      Expected Outcomes Short Term: Able to explain why pulse checking is important during independent exercise;Long Term: Able to check pulse independently and accurately Short  Term: Able to explain why pulse checking is important during independent exercise;Long Term: Able to check pulse independently and accurately      Understanding of Exercise Prescription Yes Yes      Intervention Provide education, explanation, and written materials on patient's individual exercise prescription Provide education, explanation, and written materials on patient's individual exercise prescription      Expected Outcomes Short Term: Able to explain program exercise prescription;Long Term: Able to explain home exercise prescription to exercise independently Short Term: Able to explain program exercise prescription;Long Term: Able to explain home exercise prescription to exercise independently             Exercise Goals Re-Evaluation :  Exercise Goals Re-Evaluation    [place] Name 05/06/20 1104             Exercise Goal Re-Evaluation   Exercise Goals Review Increase Physical Activity;Increase Strength and Stamina;Able to understand and use rate of perceived exertion (RPE) scale;Knowledge and understanding of Target Heart Rate Range (THRR);Able to check pulse independently;Understanding of Exercise Prescription       Comments Pt has attended 8 exercise sessions. He maintains a positive attitude and is accepting of workload increases. He currently exercises at 2.1 METs on the stepper. Will continue to monitor and progress as able.       Expected Outcomes Through exercise at rehab and by engaging in a home exercise program the patient will reach their goals.               Discharge Exercise Prescription (Final Exercise Prescription Changes):  Exercise Prescription Changes - 05/08/20 1217      Response to Exercise   Blood Pressure (Admit) 126/64    Blood Pressure (Exercise) 160/72    Blood Pressure (Exit) 102/60    Heart Rate (Admit) 83 bpm    Heart Rate (Exercise) 107 bpm    Heart Rate (Exit) 86 bpm    Rating of Perceived Exertion (Exercise) 12    Duration Continue with 30 min  of aerobic exercise without signs/symptoms of physical distress.    Intensity THRR unchanged      Progression   Progression Continue to progress workloads to maintain intensity without signs/symptoms of physical distress.      Resistance Training   Training Prescription Yes    Weight 4 lbs    Reps 10-15  Time 10 Minutes      Treadmill   MPH 1.8    Grade 0    Minutes 22    METs 2.38      NuStep   Level 3    SPM 98    Minutes 17    METs 2.1           Nutrition:  Target Goals: Understanding of nutrition guidelines, daily intake of sodium <1570m, cholesterol <2029m calories 30% from fat and 7% or less from saturated fats, daily to have 5 or more servings of fruits and vegetables.  Biometrics:  Pre Biometrics - 06/17/20 1413      Pre Biometrics   Height '5\' 9"'  (1.753 m)    Weight 109.6 kg    Waist Circumference 48.5 inches    Hip Circumference 43.5 inches    Waist to Hip Ratio 1.11 %    BMI (Calculated) 35.67    Triceps Skinfold 7 mm    % Body Fat 21.3 %    Grip Strength 29.6 kg    Flexibility 0 in    Single Leg Stand 3 seconds            Nutrition Therapy Plan and Nutrition Goals:  Nutrition Therapy & Goals - 04/29/20 1253      Personal Nutrition Goals   Comments Patient scored 30 on his diet assessment. He says he is following a low fat, low carbohydrate, sugar diet. We will continue to provide heart healthy nutritional education through handouts.      Intervention Plan   Intervention Nutrition handout(s) given to patient.           Nutrition Assessments:  Nutrition Assessments - 06/17/20 1258      MEDFICTS Scores   Pre Score 9          MEDIFICTS Score Key:  ?70 Need to make dietary changes   40-70 Heart Healthy Diet  ? 40 Therapeutic Level Cholesterol Diet   Picture Your Plate Scores:  <4<53nhealthy dietary pattern with much room for improvement.  41-50 Dietary pattern unlikely to meet recommendations for good health and room for  improvement.  51-60 More healthful dietary pattern, with some room for improvement.   >60 Healthy dietary pattern, although there may be some specific behaviors that could be improved.    Nutrition Goals Re-Evaluation:   Nutrition Goals Discharge (Final Nutrition Goals Re-Evaluation):   Psychosocial: Target Goals: Acknowledge presence or absence of significant depression and/or stress, maximize coping skills, provide positive support system. Participant is able to verbalize types and ability to use techniques and skills needed for reducing stress and depression.  Initial Review & Psychosocial Screening:  Initial Psych Review & Screening - 06/17/20 1256      Initial Review   Current issues with Current Sleep Concerns      Family Dynamics   Good Support System? Yes    Comments He has a good support system. His wife, son, daughter in law, and grandchildren all support him. His mother, father, brother, [place] and visits them.      Barriers   Psychosocial barriers to participate in program The patient should benefit from training in stress management and relaxation.      Screening Interventions   Interventions Encouraged to exercise;To provide support and resources with identified psychosocial needs    Expected Outcomes Long Term Goal: Stressors or current issues are controlled or eliminated.;Short Term goal: Identification and review  with participant of any Quality of Life or Depression concerns found by scoring the questionnaire.;Long Term goal: The participant improves quality of Life and PHQ9 Scores as seen by post scores and/or verbalization of changes           Quality of Life Scores:  Quality of Life - 06/17/20 1414      Quality of Life   Select Quality of Life      Quality of Life Scores   Health/Function Pre 11.47 %    Socioeconomic Pre 20.29 %    Psych/Spiritual Pre 14.79 %    Family Pre 25.2 %    GLOBAL Pre  15.99 %          Scores of 19 and below usually indicate a poorer quality of life in these areas.  A difference of  2-3 points is a clinically meaningful difference.  A difference of 2-3 points in the total score of the Quality of Life Index has been associated with significant improvement in overall quality of life, self-image, physical symptoms, and general health in studies assessing change in quality of life.  PHQ-9: Recent Review Flowsheet Data    Depression screen [facility] 2/9 06/17/2020 05/06/2020 04/11/2020 02/13/2020 01/31/2020   Decreased Interest 0 0 1 0 0   Down, Depressed, Hopeless 1 0 1 0 0   PHQ - 2 Score 1 0 2 0 0   Altered sleeping 3 - 3  - -   Tired, decreased energy 2 - 2 - -   Change in appetite 1 - 0 - -   Feeling bad or failure about yourself  1 - 1 - -   Trouble concentrating 1 - 0 - -   Moving slowly or fidgety/restless 0 - 0 - -   Suicidal thoughts 0 - 0 - -   PHQ-9 Score 9 - 8 - -   Difficult doing work/chores Somewhat difficult - Somewhat difficult - -     Interpretation of Total Score  Total Score Depression Severity:  1-4 = Minimal depression, 5-9 = Mild depression, 10-14 = Moderate depression, 15-19 = Moderately severe depression, 20-27 = Severe depression   Psychosocial Evaluation and Intervention:  Psychosocial Evaluation - 06/17/20 1424      Psychosocial Evaluation & Interventions   Interventions Encouraged to exercise with the program and follow exercise prescription;Stress management education;Relaxation education    Comments Pt has no barriers to participating in the cardiac rehab program. He was previously discharged from the program on 05/08/2020 due to angina. He ended up getting another stent post discharge. He has been feeling a bit down over the past month or so. He does not have much energy and his SOB has increased. He does not feel like doing much and reports he lays around more than usual due to this. He also does not sleep well. 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 His PHQ-9 was a 9 and his QOL was a 15.99. I offered patient counseling services, but he declined. He reports that he copes well on his own, and has a good support system within his family. His wife, son, daughter in law, and grandchild live close by, and he reports that they support him. He has a somewhat positive outlook about attending rehab. 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 He is worried that he will have to have this  fixed at a later point. Patient's stress level is directly related to his heatlh. He will benefit from exercise training. His goals are to lose some weight and to decrease his shortness of breath.    Expected Outcomes Through the patient participating in cardiac rehab and becoming more active, his psychosocial issues will improve. Through exercise his heatlh will improve and through relaxation training his stress levels will be reduced.    Continue Psychosocial Services  Follow up required by staff           Psychosocial Re-Evaluation:  Psychosocial Re-Evaluation    [place] Name 04/29/20 1249             Psychosocial Re-Evaluation   Current issues with Current Sleep Concerns       Comments Patient is new to the program completing 6 sessions. He does have issues with sleeps which he uses Melatonin with some relief. He demonstrates a very positive attitude always smiling. He is interactive with staff and others in his class. Will continue to monitor.       Expected Outcomes Patient's sleep concerns will be managed with no other psychosocial issues identified at discharge.       Interventions Encouraged to attend Cardiac Rehabilitation for the exercise;Stress management education;Relaxation education       Continue Psychosocial Services  No Follow up required              Psychosocial Discharge (Final Psychosocial  Re-Evaluation):  Psychosocial Re-Evaluation - 04/29/20 1249      Psychosocial Re-Evaluation   Current issues with Current Sleep Concerns    Comments Patient is new to the program completing 6 sessions. He does have issues with sleeps which he uses Melatonin with some relief. He demonstrates a very positive attitude always smiling. He is interactive with staff and others in his class. Will continue to monitor.    Expected Outcomes Patient's sleep concerns will be managed with no other psychosocial issues identified at discharge.    Interventions Encouraged to attend Cardiac Rehabilitation for the exercise;Stress management education;Relaxation education    Continue Psychosocial Services  No Follow up required           Vocational Rehabilitation: Provide vocational rehab assistance to qualifying candidates.   Vocational Rehab Evaluation & Intervention:  Vocational Rehab - 06/17/20 1301      Initial Vocational Rehab Evaluation & Intervention   Assessment shows need for Vocational Rehabilitation No      Vocational Rehab Re-Evaulation   Comments Patient is retired and has no desire to return back to a job with any significant physical requirements.           Education: Education Goals: Education classes will be provided on a weekly basis, covering required topics. Participant will state understanding/return demonstration of topics presented.  Learning Barriers/Preferences:  Learning Barriers/Preferences - 06/17/20 1300      Learning Barriers/Preferences   Learning Barriers None    Learning Preferences Audio;Verbal Instruction           Education Topics: Hypertension, Hypertension Reduction -Define heart disease and high blood pressure. Discus how high blood pressure affects the body and ways to reduce high blood pressure.   Exercise and Your Heart -Discuss why it is important to exercise, the FITT principles of exercise, normal and abnormal responses to exercise, and how  to exercise safely.   Angina -Discuss definition of angina, causes of angina, treatment of angina, and how to decrease risk of having angina. [place] [facility] PHASE  II EXERCISE from 05/08/2020 in [place]  Date 04/17/20  Educator [facility]  Instruction Review Code 1- Verbalizes Understanding      Cardiac Medications -Review what the following cardiac medications are used for, how they affect the body, and side effects that may occur when taking the medications.  Medications include Aspirin, Beta blockers, calcium channel blockers, ACE Inhibitors, angiotensin receptor blockers, diuretics, digoxin, and antihyperlipidemics. Flowsheet Row CARDIAC REHAB PHASE II EXERCISE from 05/08/2020 in [place]  Date 04/24/20  Educator [facility]  Instruction Review Code 2- Demonstrated Understanding      Congestive Heart Failure -Discuss the definition of CHF, how to live with CHF, the signs and symptoms of CHF, and how keep track of weight and sodium intake.   Heart Disease and Intimacy -Discus the effect sexual activity has on the heart, how changes occur during intimacy as we age, and safety during sexual activity. Flowsheet Row CARDIAC REHAB PHASE II EXERCISE from 05/08/2020 in [place]  Date 05/08/20  Educator [facility]  Instruction Review Code 2- Demonstrated Understanding      Smoking Cessation / COPD -Discuss different methods to quit smoking, the health benefits of quitting smoking, and the definition of COPD.   Nutrition I: Fats -Discuss the types of cholesterol, what cholesterol does to the heart, and how cholesterol levels can be controlled.   Nutrition II: Labels -Discuss the different components of food labels and how to read food label   Heart Parts/Heart Disease and PAD -Discuss the anatomy of the heart, the pathway of blood circulation through the heart, and these are affected by heart disease.   Stress I:  Signs and Symptoms -Discuss the causes of stress, how stress may lead to anxiety and depression, and ways to limit stress.   Stress II: Relaxation -Discuss different types of relaxation techniques to limit stress.   Warning Signs of Stroke / TIA -Discuss definition of a stroke, what the signs and symptoms are of a stroke, and how to identify when someone is having stroke.   Knowledge Questionnaire Score:  Knowledge Questionnaire Score - 06/17/20 1304      Knowledge Questionnaire Score   Pre Score 22/24           Core Components/Risk Factors/Patient Goals at Admission:  Personal Goals and Risk Factors at Admission - 06/17/20 1304      Core Components/Risk Factors/Patient Goals on Admission    Weight Management Yes    Intervention Weight Management: Develop a combined nutrition and exercise program designed to reach desired caloric intake, while maintaining appropriate intake of nutrient and fiber, sodium and fats, and appropriate energy expenditure required for the weight goal.;Weight Management/Obesity: Establish reasonable short term and long term weight goals.;Weight Management: Provide education and appropriate resources to help participant work on and attain dietary goals.;Obesity: Provide education and appropriate resources to help participant work on and attain dietary goals.    Expected Outcomes Short Term: Continue to assess and modify interventions until short term weight is achieved;Long Term: Adherence to nutrition and physical activity/exercise program aimed toward attainment of established weight goal;Weight Loss: Understanding of general recommendations for a balanced deficit meal plan, which promotes 1-2 lb weight loss per week and includes a negative energy balance of [phone] kcal/d;Understanding recommendations for meals to include 15-35% energy as protein, 25-35% energy from fat, 35-60% energy from carbohydrates, less than 281m of dietary cholesterol, 20-35 gm of total  fiber daily;Understanding of distribution of calorie intake throughout the day with  the consumption of 4-5 meals/snacks    Improve shortness of breath with ADL's Yes    Intervention Provide education, individualized exercise plan and daily activity instruction to help decrease symptoms of SOB with activities of daily living.    Expected Outcomes Short Term: Improve cardiorespiratory fitness to achieve a reduction of symptoms when performing ADLs;Long Term: Be able to perform more ADLs without symptoms or delay the onset of symptoms    Diabetes Yes    Intervention Provide education about signs/symptoms and action to take for hypo/hyperglycemia.;Provide education about proper nutrition, including hydration, and aerobic/resistive exercise prescription along with prescribed medications to achieve blood glucose in normal ranges: Fasting glucose 65-99 mg/dL    Expected Outcomes Short Term: Participant verbalizes understanding of the signs/symptoms and immediate care of hyper/hypoglycemia, proper foot care and importance of medication, aerobic/resistive exercise and nutrition plan for blood glucose control.;Long Term: Attainment of HbA1C < 7%.    Stress Yes    Intervention Offer individual and/or small group education and counseling on adjustment to heart disease, stress management and health-related lifestyle change. Teach and support self-help strategies.;Refer participants experiencing significant psychosocial distress to appropriate mental health specialists for further evaluation and treatment. When possible, include family members and significant others in education/counseling sessions.    Expected Outcomes Short Term: Participant demonstrates changes in health-related behavior, relaxation and other stress management skills, ability to obtain effective social support, and compliance with psychotropic medications if prescribed.;Long Term: Emotional wellbeing is indicated by absence of clinically significant  psychosocial distress or social isolation.           Core Components/Risk Factors/Patient Goals Review:   Goals and Risk Factor Review    Row Name 04/29/20 1255             Core Components/Risk Factors/Patient Goals Review   Personal Goals Review Weight Management/Obesity;Other;Diabetes       Review Patient is new to the program completing 6 sessions gaining 2.7 lbs since his intial visit. He has multiple risk factors for CAD and was referred to cardiac rehab s/p stent placement and Atherectomy. He is participating in the program for risk modification. His personal goals are to lose weight and get stronger. His last A1C was 7.6% 02/07/20 managed with Metformin and both short and long acting insulin. We will continue to monitor his progress as he works toward these goals.       Expected Outcomes Patient will complete the program meeting both personal and program goals.              Core Components/Risk Factors/Patient Goals at Discharge (Final Review):   Goals and Risk Factor Review - 04/29/20 1255      Core Components/Risk Factors/Patient Goals Review   Personal Goals Review Weight Management/Obesity;Other;Diabetes    Review Patient is new to the program completing 6 sessions gaining 2.7 lbs since his intial visit. He has multiple risk factors for CAD and was referred to cardiac rehab s/p stent placement and Atherectomy. He is participating in the program for risk modification. His personal goals are to lose weight and get stronger. His last A1C was 7.6% 02/07/20 managed with Metformin and both short and long acting insulin. We will continue to monitor his progress as he works toward these goals.    Expected Outcomes Patient will complete the program meeting both personal and program goals.           ITP Comments:   Comments: Patient arrived for 1st visit/orientation/education at 1230. Patient was referred  to CR by Dr. Claiborne Billings due to status post angioplasty with stent (V74.718).  During orientation advised patient on arrival and appointment times what to wear, what to do before, during and after exercise. Reviewed attendance and class policy.  Pt is scheduled to return Cardiac Rehab on 06/19/2020 at 0930. Pt was advised to come to class 15 minutes before class starts.  Discussed RPE/Dpysnea scales. Patient participated in warm up stretches. Patient was able to complete 6 minute walk test.  Telemetry: NSR. Patient was measured for the equipment. Discussed equipment safety with patient. Took patient pre-anthropometric measurements. Patient finished visit at 1400.

## 2020-06-19 ENCOUNTER — Other Ambulatory Visit: Payer: Self-pay

## 2020-06-19 ENCOUNTER — Encounter (HOSPITAL_COMMUNITY)
Admission: RE | Admit: 2020-06-19 | Discharge: 2020-06-19 | Disposition: A | Discharge status: Home / Self Care | Payer: Medicare PPO | Source: Ambulatory Visit | Attending: Cardiology | Admitting: Cardiology

## 2020-06-19 ENCOUNTER — Encounter (HOSPITAL_COMMUNITY): Payer: Medicare PPO

## 2020-06-19 DIAGNOSIS — Z95820 Peripheral vascular angioplasty status with implants and grafts: Secondary | ICD-10-CM | POA: Diagnosis not present

## 2020-06-19 DIAGNOSIS — Z955 Presence of coronary angioplasty implant and graft: Secondary | ICD-10-CM

## 2020-06-19 DIAGNOSIS — Z794 Long term (current) use of insulin: Secondary | ICD-10-CM | POA: Diagnosis not present

## 2020-06-19 DIAGNOSIS — Z7984 Long term (current) use of oral hypoglycemic drugs: Secondary | ICD-10-CM | POA: Diagnosis not present

## 2020-06-19 DIAGNOSIS — Z79899 Other long term (current) drug therapy: Secondary | ICD-10-CM | POA: Diagnosis not present

## 2020-06-19 DIAGNOSIS — Z7982 Long term (current) use of aspirin: Secondary | ICD-10-CM | POA: Diagnosis not present

## 2020-06-19 DIAGNOSIS — Z48812 Encounter for surgical aftercare following surgery on the circulatory system: Secondary | ICD-10-CM | POA: Diagnosis not present

## 2020-06-19 DIAGNOSIS — Z9889 Other specified postprocedural states: Secondary | ICD-10-CM

## 2020-06-19 NOTE — Progress Notes (Signed)
Daily Session Note  Patient Details  Name: [name] [name] MRN: [id number] Date of Birth: 23-Mar-1956 Referring Provider:   Flowsheet Row CARDIAC REHAB PHASE II ORIENTATION from 06/17/2020 in [place]  Referring Provider Domenic Polite      Encounter Date: 06/19/2020  Check In:  Session Check In - 06/19/20 0930      Check-In   Supervising physician immediately available to respond to emergencies [facility] MD immediately available    Physician(s) Dr. Harl Bowie    Location [facility]    Staff Present Hoy Register, MS, ACSM-CEP, Exercise Physiologist;Cayla Wiegand Audria Nine, MS, Exercise Physiologist    Virtual Visit No    Medication changes reported     No    Fall or balance concerns reported    No    Tobacco Cessation No Change    Warm-up and Cool-down Performed as group-led instruction    Resistance Training Performed Yes    VAD Patient? No    PAD/SET Patient? No      Pain Assessment   Currently in Pain? No/denies    Multiple Pain Sites No           Capillary Blood Glucose: No results found for this or any previous visit (from the past 24 hour(s)).    Social History   Tobacco Use  Smoking Status Never Smoker  Smokeless Tobacco Never Used    Goals Met:  Independence with exercise equipment Exercise tolerated well No report of cardiac concerns or symptoms Strength training completed today  Goals Unmet:  Not Applicable  Comments: check out 1030   Dr. Kathie Dike is Medical Director for [facility] Pulmonary Re[facility].

## 2020-06-21 ENCOUNTER — Encounter (HOSPITAL_COMMUNITY): Payer: Medicare PPO

## 2020-06-21 ENCOUNTER — Encounter (HOSPITAL_COMMUNITY)
Admission: RE | Admit: 2020-06-21 | Discharge: 2020-06-21 | Disposition: A | Discharge status: Home / Self Care | Payer: Medicare PPO | Source: Ambulatory Visit | Attending: Cardiology | Admitting: Cardiology

## 2020-06-21 ENCOUNTER — Other Ambulatory Visit: Payer: Self-pay

## 2020-06-21 DIAGNOSIS — Z95820 Peripheral vascular angioplasty status with implants and grafts: Secondary | ICD-10-CM

## 2020-06-21 DIAGNOSIS — Z7982 Long term (current) use of aspirin: Secondary | ICD-10-CM | POA: Diagnosis not present

## 2020-06-21 DIAGNOSIS — Z794 Long term (current) use of insulin: Secondary | ICD-10-CM | POA: Diagnosis not present

## 2020-06-21 DIAGNOSIS — Z48812 Encounter for surgical aftercare following surgery on the circulatory system: Secondary | ICD-10-CM | POA: Diagnosis not present

## 2020-06-21 DIAGNOSIS — Z7984 Long term (current) use of oral hypoglycemic drugs: Secondary | ICD-10-CM | POA: Diagnosis not present

## 2020-06-21 DIAGNOSIS — Z955 Presence of coronary angioplasty implant and graft: Secondary | ICD-10-CM | POA: Diagnosis not present

## 2020-06-21 DIAGNOSIS — Z9889 Other specified postprocedural states: Secondary | ICD-10-CM

## 2020-06-21 DIAGNOSIS — Z79899 Other long term (current) drug therapy: Secondary | ICD-10-CM | POA: Diagnosis not present

## 2020-06-21 NOTE — Progress Notes (Signed)
Daily Session Note  Patient Details  Name: [name] [name] MRN: [id number] Date of Birth: [birth year] Referring Provider:   Flowsheet Row CARDIAC REHAB PHASE II ORIENTATION from 06/17/2020 in [place]  Referring Provider Domenic Polite      Encounter Date: 06/21/2020  Check In:  Session Check In - 06/21/20 0930      Check-In   Supervising physician immediately available to respond to emergencies [facility] MD immediately available    Physician(s) Dr. Harrington Challenger    Location [facility]    Staff Present Hoy Register, MS, ACSM-CEP, Exercise Physiologist;Mishael Krysiak Audria Nine, MS, Exercise Physiologist    Virtual Visit No    Medication changes reported     No    Fall or balance concerns reported    No    Tobacco Cessation No Change    Warm-up and Cool-down Performed as group-led instruction    Resistance Training Performed Yes    VAD Patient? No    PAD/SET Patient? No      Pain Assessment   Currently in Pain? No/denies    Multiple Pain Sites No           Capillary Blood Glucose: No results found for this or any previous visit (from the past 24 hour(s)).    Social History   Tobacco Use  Smoking Status Never Smoker  Smokeless Tobacco Never Used    Goals Met:  Independence with exercise equipment Exercise tolerated well No report of cardiac concerns or symptoms Strength training completed today  Goals Unmet:  Not Applicable  Comments: check out 1030   Dr. Kathie Dike is Medical Director for [facility]monary Rehab.

## 2020-06-24 ENCOUNTER — Encounter (HOSPITAL_COMMUNITY)
Admission: RE | Admit: 2020-06-24 | Discharge: 2020-06-24 | Disposition: A | Discharge status: Home / Self Care | Payer: Medicare PPO | Source: Ambulatory Visit | Attending: Cardiology | Admitting: Cardiology

## 2020-06-24 ENCOUNTER — Other Ambulatory Visit: Payer: Self-pay

## 2020-06-24 ENCOUNTER — Encounter (HOSPITAL_COMMUNITY): Payer: Medicare PPO

## 2020-06-24 DIAGNOSIS — Z7984 Long term (current) use of oral hypoglycemic drugs: Secondary | ICD-10-CM | POA: Diagnosis not present

## 2020-06-24 DIAGNOSIS — Z95820 Peripheral vascular angioplasty status with implants and grafts: Secondary | ICD-10-CM | POA: Diagnosis not present

## 2020-06-24 DIAGNOSIS — Z7982 Long term (current) use of aspirin: Secondary | ICD-10-CM | POA: Diagnosis not present

## 2020-06-24 DIAGNOSIS — Z955 Presence of coronary angioplasty implant and graft: Secondary | ICD-10-CM | POA: Diagnosis not present

## 2020-06-24 DIAGNOSIS — Z48812 Encounter for surgical aftercare following surgery on the circulatory system: Secondary | ICD-10-CM | POA: Diagnosis not present

## 2020-06-24 DIAGNOSIS — Z79899 Other long term (current) drug therapy: Secondary | ICD-10-CM | POA: Diagnosis not present

## 2020-06-24 DIAGNOSIS — Z794 Long term (current) use of insulin: Secondary | ICD-10-CM | POA: Diagnosis not present

## 2020-06-24 DIAGNOSIS — Z9889 Other specified postprocedural states: Secondary | ICD-10-CM

## 2020-06-24 NOTE — Progress Notes (Signed)
Daily Session Note  Patient Details  Name: [name] [name] MRN: [id number] Date of Birth: 1955/05/06 Referring Provider:   Flowsheet Row CARDIAC REHAB PHASE II ORIENTATION from 06/17/2020 in [place]  Referring Provider Domenic Polite      Encounter Date: 06/24/2020  Check In:  Session Check In - 06/24/20 0930      Check-In   Supervising physician immediately available to respond to emergencies [facility] MD immediately available    Physician(s) Dr. Domenic Polite    Location [facility]    Staff Present Hoy Register, MS, ACSM-CEP, Exercise Physiologist;Wilbon Obenchain Audria Nine, MS, Exercise Physiologist    Virtual Visit No    Medication changes reported     No    Fall or balance concerns reported    No    Tobacco Cessation No Change    Warm-up and Cool-down Performed as group-led instruction    Resistance Training Performed Yes    VAD Patient? No    PAD/SET Patient? No      Pain Assessment   Currently in Pain? No/denies    Multiple Pain Sites No           Capillary Blood Glucose: No results found for this or any previous visit (from the past 24 hour(s)).    Social History   Tobacco Use  Smoking Status Never Smoker  Smokeless Tobacco Never Used    Goals Met:  Independence with exercise equipment Exercise tolerated well No report of cardiac concerns or symptoms Strength training completed today  Goals Unmet:  Not Applicable  Comments: check out 1030   Dr. Kathie Dike is Medical Director for [facility].

## 2020-06-26 ENCOUNTER — Other Ambulatory Visit: Payer: Self-pay

## 2020-06-26 ENCOUNTER — Encounter (HOSPITAL_COMMUNITY): Payer: Medicare PPO

## 2020-06-26 ENCOUNTER — Encounter (HOSPITAL_COMMUNITY)
Admission: RE | Admit: 2020-06-26 | Discharge: 2020-06-26 | Disposition: A | Discharge status: Home / Self Care | Payer: Medicare PPO | Source: Ambulatory Visit | Attending: Cardiology | Admitting: Cardiology

## 2020-06-26 DIAGNOSIS — Z95820 Peripheral vascular angioplasty status with implants and grafts: Secondary | ICD-10-CM | POA: Diagnosis not present

## 2020-06-26 DIAGNOSIS — Z955 Presence of coronary angioplasty implant and graft: Secondary | ICD-10-CM | POA: Diagnosis not present

## 2020-06-26 DIAGNOSIS — Z7984 Long term (current) use of oral hypoglycemic drugs: Secondary | ICD-10-CM | POA: Diagnosis not present

## 2020-06-26 DIAGNOSIS — Z7982 Long term (current) use of aspirin: Secondary | ICD-10-CM | POA: Diagnosis not present

## 2020-06-26 DIAGNOSIS — Z794 Long term (current) use of insulin: Secondary | ICD-10-CM | POA: Diagnosis not present

## 2020-06-26 DIAGNOSIS — Z9889 Other specified postprocedural states: Secondary | ICD-10-CM

## 2020-06-26 DIAGNOSIS — Z79899 Other long term (current) drug therapy: Secondary | ICD-10-CM | POA: Diagnosis not present

## 2020-06-26 DIAGNOSIS — Z48812 Encounter for surgical aftercare following surgery on the circulatory system: Secondary | ICD-10-CM | POA: Diagnosis not present

## 2020-06-26 NOTE — Progress Notes (Signed)
Daily Session Note  Patient Details  Name: [name] [name] MRN: [id number] Date of Birth: December 27, 1955 Referring Provider:   Flowsheet Row CARDIAC REHAB PHASE II ORIENTATION from 06/17/2020 in [place]  Referring Provider Domenic Polite      Encounter Date: 06/26/2020  Check In:  Session Check In - 06/26/20 0930      Check-In   Supervising physician immediately available to respond to emergencies [facility] MD immediately available    Physician(s) Dr. Domenic Polite    Location [facility]    Staff Present Hoy Register, MS, ACSM-CEP, Exercise Physiologist;Shawn Dannenberg Audria Nine, MS, Exercise Physiologist    Virtual Visit No    Medication changes reported     No    Fall or balance concerns reported    No    Tobacco Cessation No Change    Warm-up and Cool-down Performed as group-led instruction    Resistance Training Performed Yes    VAD Patient? No    PAD/SET Patient? No      Pain Assessment   Currently in Pain? No/denies    Multiple Pain Sites No           Capillary Blood Glucose: No results found for this or any previous visit (from the past 24 hour(s)).    Social History   Tobacco Use  Smoking Status Never Smoker  Smokeless Tobacco Never Used    Goals Met:  Independence with exercise equipment Exercise tolerated well No report of cardiac concerns or symptoms Strength training completed today  Goals Unmet:  Not Applicable  Comments: check out 1030   Dr. Kathie Dike is Medical Director for [facility] Pulmonary Re[facility].

## 2020-06-27 ENCOUNTER — Encounter (HOSPITAL_COMMUNITY): Payer: Medicare PPO

## 2020-06-28 ENCOUNTER — Encounter (HOSPITAL_COMMUNITY): Payer: Medicare PPO

## 2020-07-01 ENCOUNTER — Other Ambulatory Visit: Payer: Self-pay

## 2020-07-01 ENCOUNTER — Encounter (HOSPITAL_COMMUNITY)
Admission: RE | Admit: 2020-07-01 | Discharge: 2020-07-01 | Disposition: A | Discharge status: Home / Self Care | Payer: Medicare PPO | Source: Ambulatory Visit | Attending: Cardiology | Admitting: Cardiology

## 2020-07-01 ENCOUNTER — Encounter (HOSPITAL_COMMUNITY): Payer: Medicare PPO

## 2020-07-01 ENCOUNTER — Other Ambulatory Visit: Payer: Self-pay | Admitting: Nurse Practitioner

## 2020-07-01 VITALS — Wt 242.7 lb

## 2020-07-01 DIAGNOSIS — Z794 Long term (current) use of insulin: Secondary | ICD-10-CM

## 2020-07-01 DIAGNOSIS — Z955 Presence of coronary angioplasty implant and graft: Secondary | ICD-10-CM | POA: Diagnosis not present

## 2020-07-01 DIAGNOSIS — Z9889 Other specified postprocedural states: Secondary | ICD-10-CM | POA: Insufficient documentation

## 2020-07-01 DIAGNOSIS — Z95820 Peripheral vascular angioplasty status with implants and grafts: Secondary | ICD-10-CM | POA: Diagnosis not present

## 2020-07-01 NOTE — Progress Notes (Signed)
Daily Session Note  Patient Details  Name: [name] [name] MRN: [id number] Date of Birth: [birth year] Referring Provider:   Flowsheet Row CARDIAC REHAB PHASE II ORIENTATION from 06/17/2020 in [place]  Referring Provider Domenic Polite      Encounter Date: 07/01/2020  Check In:  Session Check In - 07/01/20 0930      Check-In   Supervising physician immediately available to respond to emergencies [facility] MD immediately available    Physician(s) Dr. Harl Bowie    Location [facility]    Staff Present Hoy Register, MS, ACSM-CEP, Exercise Physiologist;Eulalio Reamy Audria Nine, MS, Exercise Physiologist    Virtual Visit No    Medication changes reported     No    Fall or balance concerns reported    No    Tobacco Cessation No Change    Warm-up and Cool-down Performed as group-led instruction    Resistance Training Performed Yes    VAD Patient? No    PAD/SET Patient? No      Pain Assessment   Currently in Pain? No/denies    Multiple Pain Sites No           Capillary Blood Glucose: No results found for this or any previous visit (from the past 24 hour(s)).    Social History   Tobacco Use  Smoking Status Never Smoker  Smokeless Tobacco Never Used    Goals Met:  Independence with exercise equipment Exercise tolerated well No report of cardiac concerns or symptoms Strength training completed today  Goals Unmet:  Not Applicable  Comments: check out 1030   Dr. Kathie Dike is Medical Director for [facility] Pulmonary Rehab.

## 2020-07-03 ENCOUNTER — Other Ambulatory Visit: Payer: Self-pay

## 2020-07-03 ENCOUNTER — Encounter (HOSPITAL_COMMUNITY)
Admission: RE | Admit: 2020-07-03 | Discharge: 2020-07-03 | Disposition: A | Discharge status: Home / Self Care | Payer: Medicare PPO | Source: Ambulatory Visit | Attending: Cardiology | Admitting: Cardiology

## 2020-07-03 ENCOUNTER — Encounter (HOSPITAL_COMMUNITY): Payer: Medicare PPO

## 2020-07-03 DIAGNOSIS — Z955 Presence of coronary angioplasty implant and graft: Secondary | ICD-10-CM | POA: Diagnosis not present

## 2020-07-03 DIAGNOSIS — Z9889 Other specified postprocedural states: Secondary | ICD-10-CM

## 2020-07-03 DIAGNOSIS — Z95820 Peripheral vascular angioplasty status with implants and grafts: Secondary | ICD-10-CM

## 2020-07-03 NOTE — Progress Notes (Signed)
Daily Session Note  Patient Details  Name: ESAIAS CLEAVENGER MRN: [id number] Date of Birth: 08-Jul-1955 Referring Provider:   Flowsheet Row CARDIAC REHAB PHASE II ORIENTATION from 06/17/2020 in [place]  Referring Provider Domenic Polite      Encounter Date: 07/03/2020  Check In:  Session Check In - 07/03/20 0930      Check-In   Supervising physician immediately available to respond to emergencies [facility] MD immediately available    Physician(s) Dr. Harl Bowie    Location [facility]    Staff Present Hoy Register, MS, ACSM-CEP, Exercise Physiologist;Lilburn Straw Audria Nine, MS, Exercise Physiologist    Virtual Visit No    Medication changes reported     No    Fall or balance concerns reported    No    Tobacco Cessation No Change    Warm-up and Cool-down Performed as group-led instruction    Resistance Training Performed Yes    VAD Patient? No    PAD/SET Patient? No      Pain Assessment   Currently in Pain? No/denies    Multiple Pain Sites No           Capillary Blood Glucose: No results found for this or any previous visit (from the past 24 hour(s)).    Social History   Tobacco Use  Smoking Status Never Smoker  Smokeless Tobacco Never Used    Goals Met:  Independence with exercise equipment Exercise tolerated well No report of cardiac concerns or symptoms Strength training completed today  Goals Unmet:  Not Applicable  Comments: check out 1030   Dr. Kathie Dike is Medical Director for [facility] Pulmonary Rehab.

## 2020-07-03 NOTE — Progress Notes (Signed)
Cardiac Individual Treatment Plan  Patient Details  Name: [name] [name] MRN: [id number] Date of Birth: 27-Sep-1955 Referring Provider:   Flowsheet Row CARDIAC REHAB PHASE II ORIENTATION from 06/17/2020 in [place]  Referring Provider Domenic Polite      Initial Encounter Date:  Flowsheet Row CARDIAC REHAB PHASE II ORIENTATION from 06/17/2020 in [facility]  Date 06/17/20      Visit Diagnosis: S/P drug eluting coronary stent placement  Status post angioplasty with stent  History of atherectomy  Patient's Home Medications on Admission:  Current Outpatient Medications:  .  Accu-Chek Softclix Lancets lancets, USE TO CHECK BLOOD SUGAR UP TO 4 TIMES DAILY., Disp: 100 each, Rfl: 0 .  aspirin 81 MG tablet, Take 81 mg by mouth daily., Disp: , Rfl:  .  atorvastatin (LIPITOR) 80 MG tablet, Take 1 tablet (80 mg total) by mouth daily., Disp: 90 tablet, Rfl: 3 .  blood glucose meter kit and supplies, Dispense based on patient and insurance preference. Use up to four times daily as directed. (FOR ICD-10 E10.9, E11.9)., Disp: 1 each, Rfl: 0 .  carvedilol (COREG) 12.5 MG tablet, Take 1.5 tablets (18.75 mg total) by mouth 2 (two) times daily., Disp: 270 tablet, Rfl: 3 .  cetirizine (ZYRTEC) 10 MG tablet, Take 10 mg by mouth daily., Disp: , Rfl:  .  Cinnamon 500 MG TABS, Take 1,000 mg by mouth 2 (two) times daily. , Disp: , Rfl:  .  CRANBERRY PO, Take 4,200 mg by mouth 2 (two) times daily. , Disp: , Rfl:  .  GLOBAL EASE INJECT PEN NEEDLES 31G X 8 MM MISC, Use up to 8 times a day Dx E11.8, Disp: 800 each, Rfl: 3 .  glucose blood (ACCU-CHEK AVIVA PLUS) test strip, Check BS up to 4 times a day dx E11.8, Disp: 100 strip, Rfl: 11 .  Insulin Aspart FlexPen 100 UNIT/ML SOPN, INJECT UP TO 45 UNITS EVERY DAY (Patient taking differently: Inject 45-75 Units into the skin 2 (two) times daily with a meal. INJECT 45-75 units subq with breakfast and supper. Sliding scale),  Disp: 15 mL, Rfl: 11 .  isosorbide mononitrate (IMDUR) 30 MG 24 hr tablet, Take 1 tablet (30 mg total) by mouth daily., Disp: 90 tablet, Rfl: 3 .  LANTUS SOLOSTAR 100 UNIT/ML Solostar Pen, INJECT 44 UNITS EVERY MORNING AND 75 UNITS EVERY EVENING., Disp: 30 mL, Rfl: 0 .  lisinopril-hydrochlorothiazide (ZESTORETIC) 20-12.5 MG tablet, Take 1 tablet by mouth daily., Disp: 180 tablet, Rfl: 1 .  metFORMIN (GLUCOPHAGE) 500 MG tablet, Take 2 tablets (1,000 mg total) by mouth 2 (two) times daily., Disp: 120 tablet, Rfl: 1 .  nitroGLYCERIN (NITROSTAT) 0.4 MG SL tablet, Place 1 tablet (0.4 mg total) under the tongue every 5 (five) minutes as needed for chest pain., Disp: 25 tablet, Rfl: 3 .  OZEMPIC, 0.25 OR 0.5 MG/DOSE, 2 MG/1.5ML SOPN, INJECT 0.375 MLS (0.5 MG TOTAL) INTO THE SKIN ONCE A WEEK., Disp: 1.5 mL, Rfl: 0 .  pantoprazole (PROTONIX) 40 MG tablet, Take 1 tablet (40 mg total) by mouth daily as needed (for acid reflux)., Disp: 90 tablet, Rfl: 3 .  prasugrel (EFFIENT) 10 MG TABS tablet, TAKE 1 TABLET ONCE DAILY. (Patient taking differently: Take 10 mg by mouth daily.), Disp: 90 tablet, Rfl: 1 .  tamsulosin (FLOMAX) 0.4 MG CAPS capsule, TAKE 1 CAPSULE BY MOUTH AT BEDTIME., Disp: 30 capsule, Rfl: 0  Current Facility-Administered Medications:  .  sodium chloride flush (NS) 0.9 % injection  3 mL, 3 mL, Intravenous, Q12H, Satira Sark, MD  Past Medical History: Past Medical History:  Diagnosis Date  . Anxiety   . Coronary atherosclerosis of native coronary artery    DES distal circumflex 2005; DES LAD/diagonal bifurcation 09/2010; DES ostial LAD and DES left PL 01/2020  . Essential hypertension   . GERD (gastroesophageal reflux disease)   . History of kidney stones   . Mixed hyperlipidemia   . Myocardial infarction ([place])    Anterolateral with VF arrest 7/12  . Type 2 diabetes mellitus ([facility])     Tobacco Use: Social History   Tobacco Use  Smoking Status Never Smoker  Smokeless Tobacco  Never Used    Labs: Recent Review Flowsheet Data    Labs for ITP Cardiac and Pulmonary Rehab Latest Ref Rng & Units 10/24/2019 01/31/2020 02/07/2020 02/08/2020 05/06/2020   Cholestrol 100 - 199 mg/dL 97(L) 109 - 89 95(L)   LDLCALC 0 - 99 mg/dL 53 54 - 35 47   HDL >39 mg/dL 18(L) 21(L) - 16(L) 23(L)   Trlycerides 0 - 149 mg/dL 145 209(H) - 188(H) 145   Hemoglobin A1c <7.0 % 8.1(H) 7.6(H) 7.6(H) - 7.1(H)   TCO2 0 - 100 mmol/L - - - - -      Capillary Blood Glucose: Lab Results  Component Value Date   GLUCAP 161 (H) 06/17/2020   GLUCAP 154 (H) 05/22/2020   GLUCAP 192 (H) 05/22/2020   GLUCAP 130 (H) 04/11/2020   GLUCAP 226 (H) 02/10/2020    POCT Glucose    Row Name 06/17/20 1310             POCT Blood Glucose   Pre-Exercise 161 mg/dL              Exercise Target Goals: Exercise Program Goal: Individual exercise prescription set using results from initial 6 min walk test and THRR while considering  patient's activity barriers and safety.   Exercise Prescription Goal: Starting with aerobic activity 30 plus minutes a day, 3 days per week for initial exercise prescription. Provide home exercise prescription and guidelines that participant acknowledges understanding prior to discharge.  Activity Barriers & Risk Stratification:  Activity Barriers & Cardiac Risk Stratification - 06/17/20 1255      Activity Barriers & Cardiac Risk Stratification   Activity Barriers Deconditioning;Shortness of Breath;Joint Problems;Chest Pain/Angina    Cardiac Risk Stratification High           6 Minute Walk:  6 Minute Walk    Row Name 06/17/20 1404         6 Minute Walk   Phase Initial     Distance 1175 feet     Walk Time 6 minutes     # of Rest Breaks 0     MPH 2.2     METS 2.54     RPE 12     VO2 Peak 8.9     Symptoms Yes (comment)     Comments chest discomfort 4/10. He says that he feels like it is in his lungs/airway rather than his heart. Does not feel similar to any pain  he had with cardiac event     Resting HR 84 bpm     Resting BP 112/60     Resting Oxygen Saturation  94 %     Exercise Oxygen Saturation  during 6 min walk 97 %     Max Ex. HR 100 bpm     Max Ex. BP 146/70  2 Minute Post BP 130/70            Oxygen Initial Assessment:   Oxygen Re-Evaluation:   Oxygen Discharge (Final Oxygen Re-Evaluation):   Initial Exercise Prescription:  Initial Exercise Prescription - 06/17/20 1400      Date of Initial Exercise RX and Referring Provider   Date 06/17/20    Referring Provider Domenic Polite    Expected Discharge Date 09/06/20      Treadmill   MPH 1.5    Grade 0    Minutes 17      NuStep   Level 2    SPM 80    Minutes 22      Prescription Details   Frequency (times per week) 3    Duration Progress to 30 minutes of continuous aerobic without signs/symptoms of physical distress      Intensity   THRR 40-80% of Max Heartrate 62-123    Ratings of Perceived Exertion 11-13      Resistance Training   Training Prescription Yes    Weight 4 lbs    Reps 10-15           Perform Capillary Blood Glucose checks as needed.  Exercise Prescription Changes:   Exercise Prescription Changes    Row Name 05/06/20 1100 05/08/20 1217 07/01/20 1200         Response to Exercise   Blood Pressure (Admit) 130/68 126/64 110/70     Blood Pressure (Exercise) 128/76 160/72 136/68     Blood Pressure (Exit) 110/62 102/60 106/74     Heart Rate (Admit) 80 bpm 83 bpm 74 bpm     Heart Rate (Exercise) 105 bpm 107 bpm 104 bpm     Heart Rate (Exit) 89 bpm 86 bpm 83 bpm     Rating of Perceived Exertion (Exercise) '13 12 12     ' Duration Continue with 30 min of aerobic exercise without signs/symptoms of physical distress. Continue with 30 min of aerobic exercise without signs/symptoms of physical distress. Continue with 30 min of aerobic exercise without signs/symptoms of physical distress.     Intensity THRR unchanged THRR unchanged THRR unchanged            Progression   Progression Continue to progress workloads to maintain intensity without signs/symptoms of physical distress. Continue to progress workloads to maintain intensity without signs/symptoms of physical distress. Continue to progress workloads to maintain intensity without signs/symptoms of physical distress.           Resistance Training   Training Prescription Yes Yes Yes     Weight 4 lbs 4 lbs 4 lbs     Reps 10-15 10-15 10-15     Time 10 Minutes 10 Minutes 10 Minutes           Treadmill   MPH -- 1.8 1.7     Grade -- 0 0     Minutes -- 22 17     METs -- 2.38 2.3           NuStep   Level '3 3 3     ' SPM 105 98 93     Minutes '22 17 22     ' METs 2.1 2.1 2.1           Arm Ergometer   Level 1.5 -- --     RPM 67 -- --     Minutes 17 -- --     METs 2 -- --  Exercise Comments:   Exercise Goals and Review:   Exercise Goals    Row Name 05/06/20 1103 06/17/20 1412 07/01/20 1224         Exercise Goals   Increase Physical Activity Yes Yes Yes     Intervention Provide advice, education, support and counseling about physical activity/exercise needs.;Develop an individualized exercise prescription for aerobic and resistive training based on initial evaluation findings, risk stratification, comorbidities and participant's personal goals. Provide advice, education, support and counseling about physical activity/exercise needs.;Develop an individualized exercise prescription for aerobic and resistive training based on initial evaluation findings, risk stratification, comorbidities and participant's personal goals. Provide advice, education, support and counseling about physical activity/exercise needs.;Develop an individualized exercise prescription for aerobic and resistive training based on initial evaluation findings, risk stratification, comorbidities and participant's personal goals.     Expected Outcomes Short Term: Attend rehab on a regular basis to increase  amount of physical activity.;Long Term: Add in home exercise to make exercise part of routine and to increase amount of physical activity.;Long Term: Exercising regularly at least 3-5 days a week. Short Term: Attend rehab on a regular basis to increase amount of physical activity.;Long Term: Add in home exercise to make exercise part of routine and to increase amount of physical activity.;Long Term: Exercising regularly at least 3-5 days a week. Short Term: Attend rehab on a regular basis to increase amount of physical activity.;Long Term: Add in home exercise to make exercise part of routine and to increase amount of physical activity.;Long Term: Exercising regularly at least 3-5 days a week.     Increase Strength and Stamina Yes Yes Yes     Intervention Provide advice, education, support and counseling about physical activity/exercise needs.;Develop an individualized exercise prescription for aerobic and resistive training based on initial evaluation findings, risk stratification, comorbidities and participant's personal goals. Provide advice, education, support and counseling about physical activity/exercise needs.;Develop an individualized exercise prescription for aerobic and resistive training based on initial evaluation findings, risk stratification, comorbidities and participant's personal goals. Provide advice, education, support and counseling about physical activity/exercise needs.;Develop an individualized exercise prescription for aerobic and resistive training based on initial evaluation findings, risk stratification, comorbidities and participant's personal goals.     Expected Outcomes Short Term: Perform resistance training exercises routinely during rehab and add in resistance training at home;Short Term: Increase workloads from initial exercise prescription for resistance, speed, and METs.;Long Term: Improve cardiorespiratory fitness, muscular endurance and strength as measured by increased METs  and functional capacity (6MWT) Short Term: Perform resistance training exercises routinely during rehab and add in resistance training at home;Short Term: Increase workloads from initial exercise prescription for resistance, speed, and METs.;Long Term: Improve cardiorespiratory fitness, muscular endurance and strength as measured by increased METs and functional capacity (6MWT) Short Term: Perform resistance training exercises routinely during rehab and add in resistance training at home;Short Term: Increase workloads from initial exercise prescription for resistance, speed, and METs.;Long Term: Improve cardiorespiratory fitness, muscular endurance and strength as measured by increased METs and functional capacity (6MWT)     Able to understand and use rate of perceived exertion (RPE) scale Yes Yes Yes     Intervention Provide education and explanation on how to use RPE scale Provide education and explanation on how to use RPE scale Provide education and explanation on how to use RPE scale     Expected Outcomes Short Term: Able to use RPE daily in rehab to express subjective intensity level;Long Term:  Able to use RPE to  guide intensity level when exercising independently Short Term: Able to use RPE daily in rehab to express subjective intensity level;Long Term:  Able to use RPE to guide intensity level when exercising independently Short Term: Able to use RPE daily in rehab to express subjective intensity level;Long Term:  Able to use RPE to guide intensity level when exercising independently     Knowledge and understanding of Target Heart Rate Range (THRR) Yes Yes --     Intervention Provide education and explanation of THRR including how the numbers were predicted and where they are located for reference Provide education and explanation of THRR including how the numbers were predicted and where they are located for reference Provide education and explanation of THRR including how the numbers were predicted  and where they are located for reference     Expected Outcomes Short Term: Able to use daily as guideline for intensity in rehab;Long Term: Able to use THRR to govern intensity when exercising independently;Short Term: Able to state/look up THRR Short Term: Able to use daily as guideline for intensity in rehab;Long Term: Able to use THRR to govern intensity when exercising independently;Short Term: Able to state/look up THRR Short Term: Able to use daily as guideline for intensity in rehab;Long Term: Able to use THRR to govern intensity when exercising independently;Short Term: Able to state/look up THRR     Able to check pulse independently Yes Yes Yes     Intervention Provide education and demonstration on how to check pulse in carotid and radial arteries.;Review the importance of being able to check your own pulse for safety during independent exercise Provide education and demonstration on how to check pulse in carotid and radial arteries.;Review the importance of being able to check your own pulse for safety during independent exercise Provide education and demonstration on how to check pulse in carotid and radial arteries.;Review the importance of being able to check your own pulse for safety during independent exercise     Expected Outcomes Short Term: Able to explain why pulse checking is important during independent exercise;Long Term: Able to check pulse independently and accurately Short Term: Able to explain why pulse checking is important during independent exercise;Long Term: Able to check pulse independently and accurately Short Term: Able to explain why pulse checking is important during independent exercise;Long Term: Able to check pulse independently and accurately     Understanding of Exercise Prescription Yes Yes Yes     Intervention Provide education, explanation, and written materials on patient's individual exercise prescription Provide education, explanation, and written materials on  patient's individual exercise prescription Provide education, explanation, and written materials on patient's individual exercise prescription     Expected Outcomes Short Term: Able to explain program exercise prescription;Long Term: Able to explain home exercise prescription to exercise independently Short Term: Able to explain program exercise prescription;Long Term: Able to explain home exercise prescription to exercise independently Short Term: Able to explain program exercise prescription;Long Term: Able to explain home exercise prescription to exercise independently            Exercise Goals Re-Evaluation :  Exercise Goals Re-Evaluation    Row Name 05/06/20 1104 07/01/20 1224           Exercise Goal Re-Evaluation   Exercise Goals Review Increase Physical Activity;Increase Strength and Stamina;Able to understand and use rate of perceived exertion (RPE) scale;Knowledge and understanding of Target Heart Rate Range (THRR);Able to check pulse independently;Understanding of Exercise Prescription Increase Physical Activity;Increase Strength and Stamina;Able to understand  and use rate of perceived exertion (RPE) scale;Knowledge and understanding of Target Heart Rate Range (THRR);Able to check pulse independently;Understanding of Exercise Prescription      Comments Pt has attended 8 exercise sessions. He maintains a positive attitude and is accepting of workload increases. He currently exercises at 2.1 METs on the stepper. Will continue to monitor and progress as able. Patient has completed 5 exercise sessions. He has been tolerating exercise well since his return. We have been gradually trying to increase the intensities on the treadmill hoping for no angina with increased intensities, since he experienced angina last time he was in cardiac rehab on the treadmill at an increased speed. So far no symptoms. He expressed apprehension with the treadmill because of what occurred last time. Overal lhe  maintains a positive attitude. He is currently exercising at 2.3 METs on the treadmill. Will continue to monitor and progress as able.      Expected Outcomes Through exercise at rehab and by engaging in a home exercise program the patient will reach their goals. Through exercise at rehab and by engaging in a home exercise program the patient will reach their goals.              Discharge Exercise Prescription (Final Exercise Prescription Changes):  Exercise Prescription Changes - 07/01/20 1200      Response to Exercise   Blood Pressure (Admit) 110/70    Blood Pressure (Exercise) 136/68    Blood Pressure (Exit) 106/74    Heart Rate (Admit) 74 bpm    Heart Rate (Exercise) 104 bpm    Heart Rate (Exit) 83 bpm    Rating of Perceived Exertion (Exercise) 12    Duration Continue with 30 min of aerobic exercise without signs/symptoms of physical distress.    Intensity THRR unchanged      Progression   Progression Continue to progress workloads to maintain intensity without signs/symptoms of physical distress.      Resistance Training   Training Prescription Yes    Weight 4 lbs    Reps 10-15    Time 10 Minutes      Treadmill   MPH 1.7    Grade 0    Minutes 17    METs 2.3      NuStep   Level 3    SPM 93    Minutes 22    METs 2.1           Nutrition:  Target Goals: Understanding of nutrition guidelines, daily intake of sodium <1567m, cholesterol <2069m calories 30% from fat and 7% or less from saturated fats, daily to have 5 or more servings of fruits and vegetables.  Biometrics:  Pre Biometrics - 07/01/20 1224      Pre Biometrics   Weight 110.1 kg    BMI (Calculated) 35.83            Nutrition Therapy Plan and Nutrition Goals:  Nutrition Therapy & Goals - 06/26/20 0823      Personal Nutrition Goals   Comments We provide 2 educational sessions on heart healthy nutrition with handouts.      Intervention Plan   Intervention Nutrition handout(s) given to  patient.           Nutrition Assessments:  Nutrition Assessments - 06/17/20 1258      MEDFICTS Scores   Pre Score 9          MEDIFICTS Score Key:  ?70 Need to make dietary changes   40-70 Heart Healthy Diet  ?  40 Therapeutic Level Cholesterol Diet   Picture Your Plate Scores:  <35 Unhealthy dietary pattern with much room for improvement.  41-50 Dietary pattern unlikely to meet recommendations for good health and room for improvement.  51-60 More healthful dietary pattern, with some room for improvement.   >60 Healthy dietary pattern, although there may be some specific behaviors that could be improved.    Nutrition Goals Re-Evaluation:   Nutrition Goals Discharge (Final Nutrition Goals Re-Evaluation):   Psychosocial: Target Goals: Acknowledge presence or absence of significant depression and/or stress, maximize coping skills, provide positive support system. Participant is able to verbalize types and ability to use techniques and skills needed for reducing stress and depression.  Initial Review & Psychosocial Screening:  Initial Psych Review & Screening - 06/17/20 1256      Initial Review   Current issues with Current Sleep Concerns      Family Dynamics   Good Support System? Yes    Comments He has a good support system. His wife, son, daughter in law, and grandchildren all support him. His mother, father, brother, and sister live in [place], [place]. He frequently talks with them and visits them.      Barriers   Psychosocial barriers to participate in program The patient should benefit from training in stress management and relaxation.      Screening Interventions   Interventions Encouraged to exercise;To provide support and resources with identified psychosocial needs    Expected Outcomes Long Term Goal: Stressors or current issues are controlled or eliminated.;Short Term goal: Identification and review with participant of any Quality of Life or Depression  concerns found by scoring the questionnaire.;Long Term goal: The participant improves quality of Life and PHQ9 Scores as seen by post scores and/or verbalization of changes           Quality of Life Scores:  Quality of Life - 06/17/20 1414      Quality of Life   Select Quality of Life      Quality of Life Scores   Health/Function Pre 11.47 %    Socioeconomic Pre 20.29 %    Psych/Spiritual Pre 14.79 %    Family Pre 25.2 %    GLOBAL Pre 15.99 %          Scores of 19 and below usually indicate a poorer quality of life in these areas.  A difference of  2-3 points is a clinically meaningful difference.  A difference of 2-3 points in the total score of the Quality of Life Index has been associated with significant improvement in overall quality of life, self-image, physical symptoms, and general health in studies assessing change in quality of life.  PHQ-9: Recent Review Flowsheet Data    Depression screen [facility] 2/9 06/17/2020 05/06/2020 04/11/2020 02/13/2020 01/31/2020   Decreased Interest 0 0 1 0 0   Down, Depressed, Hopeless 1 0 1 0 0   PHQ - 2 Score 1 0 2 0 0   Altered sleeping 3 - 3  - -   Tired, decreased energy 2 - 2 - -   Change in appetite 1 - 0 - -   Feeling bad or failure about yourself  1 - 1 - -   Trouble concentrating 1 - 0 - -   Moving slowly or fidgety/restless 0 - 0 - -   Suicidal thoughts 0 - 0 - -   PHQ-9 Score 9 - 8 - -   Difficult doing work/chores Somewhat difficult - Somewhat difficult - -  Interpretation of Total Score  Total Score Depression Severity:  1-4 = Minimal depression, 5-9 = Mild depression, 10-14 = Moderate depression, 15-19 = Moderately severe depression, 20-27 = Severe depression   Psychosocial Evaluation and Intervention:  Psychosocial Evaluation - 06/17/20 1424      Psychosocial Evaluation & Interventions   Interventions Encouraged to exercise with the program and follow exercise prescription;Stress management education;Relaxation  education    Comments Pt has no barriers to participating in the cardiac rehab program. He was previously discharged from the program on 05/08/2020 due to angina. He ended up getting another stent post discharge. He has been feeling a bit down over the past month or so. He does not have much energy and his SOB has increased. He does not feel like doing much and reports he lays around more than usual due to this. He also does not sleep well. 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 His PHQ-9 was a 9 and his QOL was a 15.99. I offered patient counseling services, but he declined. He reports that he copes well on his own, and has a good support system within his family. His wife, son, daughter in law, and grandchild live close by, and he reports that they support him. He has a somewhat positive outlook about attending rehab. 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 He is worried that he will have to have this fixed at a later point. Patient's stress level is directly related to his heatlh. He will benefit from exercise training. His goals are to lose some weight and to decrease his shortness of breath.    Expected Outcomes Through the patient participating in cardiac rehab and becoming more active, his psychosocial issues will improve. Through exercise his heatlh will improve and through relaxation training his stress levels will be reduced.    Continue Psychosocial Services  Follow up required by staff           Psychosocial Re-Evaluation:  Psychosocial Re-Evaluation    [place] Name 06/26/20 [phone]             Psychosocial Re-Evaluation   Current issues with Current Sleep Concerns       Comments Patient has returned to the program completing 4 sessions. He continues to have issues with sleep and uses Melatonin with some relief. He continues to demonstrate a positive attitude and seems  to enjoy coming to class. Will continue to montior.       Expected Outcomes Patient's sleep concerns will be managed with no other psychosocial issues identified at discharge.       Interventions Encouraged to attend Cardiac Rehabilitation for the exercise;Stress management education;Relaxation education       Continue Psychosocial Services  No Follow up required              Psychosocial Discharge (Final Psychosocial Re-Evaluation):  Psychosocial Re-Evaluation - 06/26/20 0824      Psychosocial Re-Evaluation   Current issues with Current Sleep Concerns    Comments Patient has returned to the program completing 4 sessions. He continues to have issues with sleep and uses Melatonin with some relief. He continues to demonstrate a positive attitude and seems to enjoy coming to class. Will continue to montior.    Expected Outcomes Patient's sleep concerns will be managed with no other psychosocial issues identified at discharge.    Interventions Encouraged to  attend Cardiac Rehabilitation for the exercise;Stress management education;Relaxation education    Continue Psychosocial Services  No Follow up required           Vocational Rehabilitation: Provide vocational rehab assistance to qualifying candidates.   Vocational Rehab Evaluation & Intervention:  Vocational Rehab - 06/17/20 1301      Initial Vocational Rehab Evaluation & Intervention   Assessment shows need for Vocational Rehabilitation No      Vocational Rehab Re-Evaulation   Comments Patient is retired and has no desire to return back to a job with any significant physical requirements.           Education: Education Goals: Education classes will be provided on a weekly basis, covering required topics. Participant will state understanding/return demonstration of topics presented.  Learning Barriers/Preferences:  Learning Barriers/Preferences - 06/17/20 1300      Learning Barriers/Preferences   Learning Barriers None     Learning Preferences Audio;Verbal Instruction           Education Topics: Hypertension, Hypertension Reduction -Define heart disease and high blood pressure. Discus how high blood pressure affects the body and ways to reduce high blood pressure.   Exercise and Your Heart -Discuss why it is important to exercise, the FITT principles of exercise, normal and abnormal responses to exercise, and how to exercise safely.   Angina -Discuss definition of angina, causes of angina, treatment of angina, and how to decrease risk of having angina. Flowsheet Row CARDIAC REHAB PHASE II EXERCISE from 06/26/2020 in [place]  Date 04/17/20  Educator [facility]  Instruction Review Code 1- Verbalizes Understanding      Cardiac Medications -Review what the following cardiac medications are used for, how they affect the body, and side effects that may occur when taking the medications.  Medications include Aspirin, Beta blockers, calcium channel blockers, ACE Inhibitors, angiotensin receptor blockers, diuretics, digoxin, and antihyperlipidemics. Flowsheet Row CARDIAC REHAB PHASE II EXERCISE from 06/26/2020 in [place]  Date 04/24/20  Educator [facility]  Instruction Review Code 2- Demonstrated Understanding      Congestive Heart Failure -Discuss the definition of CHF, how to live with CHF, the signs and symptoms of CHF, and how keep track of weight and sodium intake.   Heart Disease and Intimacy -Discus the effect sexual activity has on the heart, how changes occur during intimacy as we age, and safety during sexual activity. Flowsheet Row CARDIAC REHAB PHASE II EXERCISE from 06/26/2020 in [place]  Date 05/08/20  Educator [facility]  Instruction Review Code 2- Demonstrated Understanding      Smoking Cessation / COPD -Discuss different methods to quit smoking, the health benefits of quitting smoking, and the definition of COPD.   Nutrition  I: Fats -Discuss the types of cholesterol, what cholesterol does to the heart, and how cholesterol levels can be controlled.   Nutrition II: Labels -Discuss the different components of food labels and how to read food label   Heart Parts/Heart Disease and PAD -Discuss the anatomy of the heart, the pathway of blood circulation through the heart, and these are affected by heart disease.   Stress I: Signs and Symptoms -Discuss the causes of stress, how stress may lead to anxiety and depression, and ways to limit stress.   Stress II: Relaxation -Discuss different types of relaxation techniques to limit stress. Flowsheet Row CARDIAC REHAB PHASE II EXERCISE from 06/26/2020 in [place]  Date 06/19/20  Educator [facility]  Instruction Review Code  2- Demonstrated Understanding      Warning Signs of Stroke / TIA -Discuss definition of a stroke, what the signs and symptoms are of a stroke, and how to identify when someone is having stroke. Flowsheet Row CARDIAC REHAB PHASE II EXERCISE from 06/26/2020 in [place]  Date 06/26/20  Educator [facility]  Instruction Review Code 2- Demonstrated Understanding      Knowledge Questionnaire Score:  Knowledge Questionnaire Score - 06/17/20 1304      Knowledge Questionnaire Score   Pre Score 22/24           Core Components/Risk Factors/Patient Goals at Admission:  Personal Goals and Risk Factors at Admission - 06/17/20 1304      Core Components/Risk Factors/Patient Goals on Admission    Weight Management Yes    Intervention Weight Management: Develop a combined nutrition and exercise program designed to reach desired caloric intake, while maintaining appropriate intake of nutrient and fiber, sodium and fats, and appropriate energy expenditure required for the weight goal.;Weight Management/Obesity: Establish reasonable short term and long term weight goals.;Weight Management: Provide education and appropriate  resources to help participant work on and attain dietary goals.;Obesity: Provide education and appropriate resources to help participant work on and attain dietary goals.    Expected Outcomes Short Term: Continue to assess and modify interventions until short term weight is achieved;Long Term: Adherence to nutrition and physical activity/exercise program aimed toward attainment of established weight goal;Weight Loss: Understanding of general recommendations for a balanced deficit meal plan, which promotes 1-2 lb weight loss per week and includes a negative energy balance of [phone] kcal/d;Understanding recommendations for meals to include 15-35% energy as protein, 25-35% energy from fat, 35-60% energy from carbohydrates, less than 293m of dietary cholesterol, 20-35 gm of total fiber daily;Understanding of distribution of calorie intake throughout the day with the consumption of 4-5 meals/snacks    Improve shortness of breath with ADL's Yes    Intervention Provide education, individualized exercise plan and daily activity instruction to help decrease symptoms of SOB with activities of daily living.    Expected Outcomes Short Term: Improve cardiorespiratory fitness to achieve a reduction of symptoms when performing ADLs;Long Term: Be able to perform more ADLs without symptoms or delay the onset of symptoms    Diabetes Yes    Intervention Provide education about signs/symptoms and action to take for hypo/hyperglycemia.;Provide education about proper nutrition, including hydration, and aerobic/resistive exercise prescription along with prescribed medications to achieve blood glucose in normal ranges: Fasting glucose 65-99 mg/dL    Expected Outcomes Short Term: Participant verbalizes understanding of the signs/symptoms and immediate care of hyper/hypoglycemia, proper foot care and importance of medication, aerobic/resistive exercise and nutrition plan for blood glucose control.;Long Term: Attainment of HbA1C <  7%.    Stress Yes    Intervention Offer individual and/or small group education and counseling on adjustment to heart disease, stress management and health-related lifestyle change. Teach and support self-help strategies.;Refer participants experiencing significant psychosocial distress to appropriate mental health specialists for further evaluation and treatment. When possible, include family members and significant others in education/counseling sessions.    Expected Outcomes Short Term: Participant demonstrates changes in health-related behavior, relaxation and other stress management skills, ability to obtain effective social support, and compliance with psychotropic medications if prescribed.;Long Term: Emotional wellbeing is indicated by absence of clinically significant psychosocial distress or social isolation.           Core Components/Risk Factors/Patient Goals Review:   Goals and Risk Factor  Review    Row Name 06/26/20 0825             Core Components/Risk Factors/Patient Goals Review   Personal Goals Review Weight Management/Obesity;Other;Diabetes       Review Patient is new to the program completing 4 sessions. He was referred back to CR with DES. He has multiple risk factors for CAD and is participating in the program for risk modification. His personal goals for the program are to lose weight and decrease his SOB. We will continue to monitor his progress as he works toward meeting these goals.       Expected Outcomes Patient will complete the program meeting both personal and program goals.              Core Components/Risk Factors/Patient Goals at Discharge (Final Review):   Goals and Risk Factor Review - 06/26/20 0825      Core Components/Risk Factors/Patient Goals Review   Personal Goals Review Weight Management/Obesity;Other;Diabetes    Review Patient is new to the program completing 4 sessions. He was referred back to CR with DES. He has multiple risk factors for  CAD and is participating in the program for risk modification. His personal goals for the program are to lose weight and decrease his SOB. We will continue to monitor his progress as he works toward meeting these goals.    Expected Outcomes Patient will complete the program meeting both personal and program goals.           ITP Comments:   Comments: ITP REVIEW Pt is making expected progress toward Cardiac Rehab goals after completing 6 sessions. Recommend continued exercise, life style modification, education, and increased stamina and strength.

## 2020-07-05 ENCOUNTER — Encounter (HOSPITAL_COMMUNITY)
Admission: RE | Admit: 2020-07-05 | Discharge: 2020-07-05 | Disposition: A | Discharge status: Home / Self Care | Payer: Medicare PPO | Source: Ambulatory Visit | Attending: Cardiology | Admitting: Cardiology

## 2020-07-05 ENCOUNTER — Other Ambulatory Visit: Payer: Self-pay

## 2020-07-05 ENCOUNTER — Encounter (HOSPITAL_COMMUNITY): Payer: Medicare PPO

## 2020-07-05 DIAGNOSIS — Z955 Presence of coronary angioplasty implant and graft: Secondary | ICD-10-CM

## 2020-07-05 DIAGNOSIS — Z9889 Other specified postprocedural states: Secondary | ICD-10-CM | POA: Diagnosis not present

## 2020-07-05 DIAGNOSIS — Z95820 Peripheral vascular angioplasty status with implants and grafts: Secondary | ICD-10-CM | POA: Diagnosis not present

## 2020-07-05 NOTE — Progress Notes (Signed)
Daily Session Note  Patient Details  Name: [name] [name] MRN: [id number] Date of Birth: 12/09/1955 Referring Provider:   Flowsheet Row CARDIAC REHAB PHASE II ORIENTATION from 06/17/2020 in [place]  Referring Provider Domenic Polite      Encounter Date: 07/05/2020  Check In:  Session Check In - 07/05/20 0930      Check-In   Supervising physician immediately available to respond to emergencies [facility] MD immediately available    Physician(s) Dr. Harl Bowie    Location [facility]    Staff Present Aundra Dubin, RN, Bjorn Loser, MS, ACSM-CEP, Exercise Physiologist    Virtual Visit No    Medication changes reported     No    Fall or balance concerns reported    No    Tobacco Cessation No Change    Warm-up and Cool-down Performed as group-led instruction    Resistance Training Performed Yes    VAD Patient? No    PAD/SET Patient? No      Pain Assessment   Currently in Pain? No/denies    Multiple Pain Sites No           Capillary Blood Glucose: No results found for this or any previous visit (from the past 24 hour(s)).    Social History   Tobacco Use  Smoking Status Never Smoker  Smokeless Tobacco Never Used    Goals Met:  Independence with exercise equipment Exercise tolerated well No report of cardiac concerns or symptoms Strength training completed today  Goals Unmet:  Not Applicable  Comments: Check out 1030.   Dr. Kathie Dike is Medical Director for [facility] Pulmonary Rehab.

## 2020-07-08 ENCOUNTER — Other Ambulatory Visit: Payer: Self-pay

## 2020-07-08 ENCOUNTER — Encounter (HOSPITAL_COMMUNITY)
Admission: RE | Admit: 2020-07-08 | Discharge: 2020-07-08 | Disposition: A | Discharge status: Home / Self Care | Payer: Medicare PPO | Source: Ambulatory Visit | Attending: Cardiology | Admitting: Cardiology

## 2020-07-08 DIAGNOSIS — Z95820 Peripheral vascular angioplasty status with implants and grafts: Secondary | ICD-10-CM

## 2020-07-08 DIAGNOSIS — Z9889 Other specified postprocedural states: Secondary | ICD-10-CM

## 2020-07-08 DIAGNOSIS — Z955 Presence of coronary angioplasty implant and graft: Secondary | ICD-10-CM | POA: Diagnosis not present

## 2020-07-08 NOTE — Progress Notes (Signed)
Daily Session Note  Patient Details  Name: [name] [name] MRN: [id number] Date of Birth: 08-Mar-1956 Referring Provider:   Flowsheet Row CARDIAC REHAB PHASE II ORIENTATION from 06/17/2020 in [place]  Referring Provider Domenic Polite      Encounter Date: 07/08/2020  Check In:  Session Check In - 07/08/20 0930      Check-In   Supervising physician immediately available to respond to emergencies [facility] MD immediately available    Physician(s) Dr. Melene Muller    Location [facility]    Staff Present Cathren Harsh, MS, Exercise Physiologist;Dalton Kris Mouton, MS, ACSM-CEP, Exercise Physiologist    Virtual Visit No    Medication changes reported     No    Fall or balance concerns reported    No    Tobacco Cessation No Change    Warm-up and Cool-down Performed as group-led instruction    Resistance Training Performed Yes    VAD Patient? No    PAD/SET Patient? No      Pain Assessment   Currently in Pain? No/denies    Multiple Pain Sites No           Capillary Blood Glucose: No results found for this or any previous visit (from the past 24 hour(s)).    Social History   Tobacco Use  Smoking Status Never Smoker  Smokeless Tobacco Never Used    Goals Met:  Independence with exercise equipment Exercise tolerated well No report of cardiac concerns or symptoms Strength training completed today  Goals Unmet:  Not Applicable  Comments: check out 1030   Dr. Kathie Dike is Medical Director for [facility] Pulmonary Rehab.

## 2020-07-10 ENCOUNTER — Encounter (HOSPITAL_COMMUNITY)
Admission: RE | Admit: 2020-07-10 | Discharge: 2020-07-10 | Disposition: A | Discharge status: Home / Self Care | Payer: Medicare PPO | Source: Ambulatory Visit | Attending: Cardiology | Admitting: Cardiology

## 2020-07-10 ENCOUNTER — Other Ambulatory Visit: Payer: Self-pay

## 2020-07-10 DIAGNOSIS — Z955 Presence of coronary angioplasty implant and graft: Secondary | ICD-10-CM | POA: Diagnosis not present

## 2020-07-10 DIAGNOSIS — Z95820 Peripheral vascular angioplasty status with implants and grafts: Secondary | ICD-10-CM

## 2020-07-10 DIAGNOSIS — Z9889 Other specified postprocedural states: Secondary | ICD-10-CM | POA: Diagnosis not present

## 2020-07-10 NOTE — Progress Notes (Signed)
Daily Session Note  Patient Details  Name: [name] [name] MRN: [id number] Date of Birth: 02-14-1956 Referring Provider:   Flowsheet Row CARDIAC REHAB PHASE II ORIENTATION from 06/17/2020 in [place]  Referring Provider Domenic Polite      Encounter Date: 07/10/2020  Check In:  Session Check In - 07/10/20 0930      Check-In   Supervising physician immediately available to respond to emergencies [facility] MD immediately available    Physician(s) Dr. Melene Muller    Location [facility]    Staff Present Cathren Harsh, MS, Exercise Physiologist;Dalton Kris Mouton, MS, ACSM-CEP, Exercise Physiologist    Virtual Visit No    Medication changes reported     No    Fall or balance concerns reported    No    Tobacco Cessation No Change    Warm-up and Cool-down Performed as group-led instruction    Resistance Training Performed Yes    VAD Patient? No    PAD/SET Patient? No      Pain Assessment   Currently in Pain? No/denies    Multiple Pain Sites No           Capillary Blood Glucose: No results found for this or any previous visit (from the past 24 hour(s)).    Social History   Tobacco Use  Smoking Status Never Smoker  Smokeless Tobacco Never Used    Goals Met:  Independence with exercise equipment Exercise tolerated well No report of cardiac concerns or symptoms Strength training completed today  Goals Unmet:  Not Applicable  Comments: checkout 62   Dr. Kathie Dike is Medical Director for [facility] Pulmonary Rehab.

## 2020-07-11 ENCOUNTER — Other Ambulatory Visit: Payer: Self-pay | Admitting: Nurse Practitioner

## 2020-07-11 DIAGNOSIS — E1159 Type 2 diabetes mellitus with other circulatory complications: Secondary | ICD-10-CM

## 2020-07-11 DIAGNOSIS — Z794 Long term (current) use of insulin: Secondary | ICD-10-CM

## 2020-07-12 ENCOUNTER — Encounter (HOSPITAL_COMMUNITY)
Admission: RE | Admit: 2020-07-12 | Discharge: 2020-07-12 | Disposition: A | Discharge status: Home / Self Care | Payer: Medicare PPO | Source: Ambulatory Visit | Attending: Cardiology | Admitting: Cardiology

## 2020-07-12 ENCOUNTER — Other Ambulatory Visit: Payer: Self-pay

## 2020-07-12 DIAGNOSIS — Z9889 Other specified postprocedural states: Secondary | ICD-10-CM | POA: Diagnosis not present

## 2020-07-12 DIAGNOSIS — Z955 Presence of coronary angioplasty implant and graft: Secondary | ICD-10-CM | POA: Diagnosis not present

## 2020-07-12 DIAGNOSIS — Z95820 Peripheral vascular angioplasty status with implants and grafts: Secondary | ICD-10-CM | POA: Diagnosis not present

## 2020-07-12 NOTE — Progress Notes (Signed)
Daily Session Note  Patient Details  Name: [name] [name] MRN: [id number] Date of Birth: 1955-07-13 Referring Provider:   Flowsheet Row CARDIAC REHAB PHASE II ORIENTATION from 06/17/2020 in [place]  Referring Provider Domenic Polite      Encounter Date: 07/12/2020  Check In:  Session Check In - 07/12/20 0930      Check-In   Supervising physician immediately available to respond to emergencies [facility] MD immediately available    Physician(s) Dr. Harrington Challenger    Location [facility]    Staff Present Cathren Harsh, MS, Exercise Physiologist;[name] Kris Mouton, MS, ACSM-CEP, Exercise Physiologist    Virtual Visit No    Medication changes reported     No    Fall or balance concerns reported    No    Tobacco Cessation No Change    Warm-up and Cool-down Performed as group-led instruction    Resistance Training Performed Yes    VAD Patient? No    PAD/SET Patient? No      Pain Assessment   Currently in Pain? No/denies    Multiple Pain Sites No           Capillary Blood Glucose: No results found for this or any previous visit (from the past 24 hour(s)).    Social History   Tobacco Use  Smoking Status Never Smoker  Smokeless Tobacco Never Used    Goals Met:  Independence with exercise equipment Exercise tolerated well No report of cardiac concerns or symptoms Strength training completed today  Goals Unmet:  Not Applicable  Comments: checkout time is 1030   Dr. Kathie Dike is Medical Director for [facility] Pulmonary Rehab.

## 2020-07-15 ENCOUNTER — Encounter (HOSPITAL_COMMUNITY)
Admission: RE | Admit: 2020-07-15 | Discharge: 2020-07-15 | Disposition: A | Discharge status: Home / Self Care | Payer: Medicare PPO | Source: Ambulatory Visit | Attending: Cardiology | Admitting: Cardiology

## 2020-07-15 ENCOUNTER — Other Ambulatory Visit: Payer: Self-pay

## 2020-07-15 VITALS — Wt 243.2 lb

## 2020-07-15 DIAGNOSIS — Z9889 Other specified postprocedural states: Secondary | ICD-10-CM

## 2020-07-15 DIAGNOSIS — Z95820 Peripheral vascular angioplasty status with implants and grafts: Secondary | ICD-10-CM

## 2020-07-15 DIAGNOSIS — Z955 Presence of coronary angioplasty implant and graft: Secondary | ICD-10-CM | POA: Diagnosis not present

## 2020-07-15 NOTE — Progress Notes (Signed)
Daily Session Note  Patient Details  Name: [name] [name] MRN: [id number] Date of Birth: 1956-02-14 Referring Provider:   Flowsheet Row CARDIAC REHAB PHASE II ORIENTATION from 06/17/2020 in [place]  Referring Provider Domenic Polite      Encounter Date: 07/15/2020  Check In:  Session Check In - 07/15/20 0930      Check-In   Supervising physician immediately available to respond to emergencies [facility] MD immediately available    Physician(s) Dr. Harl Bowie    Location [facility]    Staff Present Cathren Harsh, MS, Exercise Physiologist;Dalton Kris Mouton, MS, ACSM-CEP, Exercise Physiologist    Virtual Visit No    Medication changes reported     No    Fall or balance concerns reported    No    Tobacco Cessation No Change    Warm-up and Cool-down Performed as group-led instruction    Resistance Training Performed Yes    VAD Patient? No    PAD/SET Patient? No      Pain Assessment   Currently in Pain? No/denies    Multiple Pain Sites No           Capillary Blood Glucose: No results found for this or any previous visit (from the past 24 hour(s)).    Social History   Tobacco Use  Smoking Status Never Smoker  Smokeless Tobacco Never Used    Goals Met:  Independence with exercise equipment Exercise tolerated well No report of cardiac concerns or symptoms Strength training completed today  Goals Unmet:  Not Applicable  Comments: check out 1030   Dr. Kathie Dike is Medical Director for [facility] Pulmonary Rehab.

## 2020-07-16 ENCOUNTER — Other Ambulatory Visit: Payer: Self-pay | Admitting: Nurse Practitioner

## 2020-07-16 DIAGNOSIS — E1159 Type 2 diabetes mellitus with other circulatory complications: Secondary | ICD-10-CM

## 2020-07-16 DIAGNOSIS — Z794 Long term (current) use of insulin: Secondary | ICD-10-CM

## 2020-07-17 ENCOUNTER — Other Ambulatory Visit: Payer: Self-pay

## 2020-07-17 ENCOUNTER — Encounter (HOSPITAL_COMMUNITY)
Admission: RE | Admit: 2020-07-17 | Discharge: 2020-07-17 | Disposition: A | Discharge status: Home / Self Care | Payer: Medicare PPO | Source: Ambulatory Visit | Attending: Cardiology | Admitting: Cardiology

## 2020-07-17 DIAGNOSIS — Z9889 Other specified postprocedural states: Secondary | ICD-10-CM | POA: Diagnosis not present

## 2020-07-17 DIAGNOSIS — Z955 Presence of coronary angioplasty implant and graft: Secondary | ICD-10-CM | POA: Diagnosis not present

## 2020-07-17 DIAGNOSIS — Z95820 Peripheral vascular angioplasty status with implants and grafts: Secondary | ICD-10-CM | POA: Diagnosis not present

## 2020-07-17 NOTE — Progress Notes (Signed)
Daily Session Note  Patient Details  Name: [name] [name] MRN: [id number] Date of Birth: 1956-02-11 Referring Provider:   Flowsheet Row CARDIAC REHAB PHASE II ORIENTATION from 06/17/2020 in [place]  Referring Provider Domenic Polite      Encounter Date: 07/17/2020  Check In:  Session Check In - 07/17/20 0930      Check-In   Supervising physician immediately available to respond to emergencies [facility] MD immediately available    Physician(s) Dr. Harl Bowie    Location [facility]    Staff Present Cathren Harsh, MS, Exercise Physiologist;Dalton Kris Mouton, MS, ACSM-CEP, Exercise Physiologist    Virtual Visit No    Medication changes reported     No    Fall or balance concerns reported    No    Tobacco Cessation No Change    Warm-up and Cool-down Performed as group-led instruction    Resistance Training Performed Yes    VAD Patient? No    PAD/SET Patient? No      Pain Assessment   Currently in Pain? No/denies    Multiple Pain Sites No           Capillary Blood Glucose: No results found for this or any previous visit (from the past 24 hour(s)).    Social History   Tobacco Use  Smoking Status Never Smoker  Smokeless Tobacco Never Used    Goals Met:  Independence with exercise equipment Exercise tolerated well No report of cardiac concerns or symptoms Strength training completed today  Goals Unmet:  Not Applicable  Comments: check out 1030   Dr. Kathie Dike is Medical Director for [facility] Pulmonary Rehab.

## 2020-07-19 ENCOUNTER — Other Ambulatory Visit: Payer: Self-pay

## 2020-07-19 ENCOUNTER — Encounter (HOSPITAL_COMMUNITY)
Admission: RE | Admit: 2020-07-19 | Discharge: 2020-07-19 | Disposition: A | Discharge status: Home / Self Care | Payer: Medicare PPO | Source: Ambulatory Visit | Attending: Cardiology | Admitting: Cardiology

## 2020-07-19 VITALS — Wt 241.6 lb

## 2020-07-19 DIAGNOSIS — Z95820 Peripheral vascular angioplasty status with implants and grafts: Secondary | ICD-10-CM

## 2020-07-19 DIAGNOSIS — Z9889 Other specified postprocedural states: Secondary | ICD-10-CM

## 2020-07-19 DIAGNOSIS — Z955 Presence of coronary angioplasty implant and graft: Secondary | ICD-10-CM | POA: Diagnosis not present

## 2020-07-19 NOTE — Progress Notes (Signed)
Daily Session Note  Patient Details  Name: [name] [name] MRN: [id number] Date of Birth: [birth year] Referring Provider:   Flowsheet Row CARDIAC REHAB PHASE II ORIENTATION from 06/17/2020 in [place]  Referring Provider Domenic Polite      Encounter Date: 07/19/2020  Check In:  Session Check In - 07/19/20 0930      Check-In   Supervising physician immediately available to respond to emergencies [facility] MD immediately available    Physician(s) Dr. Domenic Polite    Location [facility]    Staff Present Aundra Dubin, RN, BSN;Darnell Jeschke Audria Nine, MS, Exercise Physiologist    Virtual Visit No    Medication changes reported     No    Fall or balance concerns reported    No    Tobacco Cessation No Change    Warm-up and Cool-down Performed as group-led instruction    Resistance Training Performed Yes    VAD Patient? No    PAD/SET Patient? No      Pain Assessment   Currently in Pain? No/denies    Multiple Pain Sites No           Capillary Blood Glucose: No results found for this or any previous visit (from the past 24 hour(s)).    Social History   Tobacco Use  Smoking Status Never Smoker  Smokeless Tobacco Never Used    Goals Met:  Independence with exercise equipment Exercise tolerated well No report of cardiac concerns or symptoms Strength training completed today  Goals Unmet:  Not Applicable  Comments: check out 1030   Dr. Kathie Dike is Medical Director for [facility] Pulmonary Rehab.

## 2020-07-22 ENCOUNTER — Encounter (HOSPITAL_COMMUNITY)
Admission: RE | Admit: 2020-07-22 | Discharge: 2020-07-22 | Disposition: A | Discharge status: Home / Self Care | Payer: Medicare PPO | Source: Ambulatory Visit | Attending: Cardiology | Admitting: Cardiology

## 2020-07-22 ENCOUNTER — Other Ambulatory Visit: Payer: Self-pay

## 2020-07-22 ENCOUNTER — Ambulatory Visit (INDEPENDENT_AMBULATORY_CARE_PROVIDER_SITE_OTHER): Payer: Medicare PPO

## 2020-07-22 VITALS — Ht 69.0 in | Wt 240.0 lb

## 2020-07-22 DIAGNOSIS — Z0000 Encounter for general adult medical examination without abnormal findings: Secondary | ICD-10-CM

## 2020-07-22 DIAGNOSIS — Z95820 Peripheral vascular angioplasty status with implants and grafts: Secondary | ICD-10-CM

## 2020-07-22 DIAGNOSIS — Z9889 Other specified postprocedural states: Secondary | ICD-10-CM | POA: Diagnosis not present

## 2020-07-22 DIAGNOSIS — Z955 Presence of coronary angioplasty implant and graft: Secondary | ICD-10-CM

## 2020-07-22 NOTE — Progress Notes (Signed)
Subjective:   [name] [name] is a 65 y.o. male who presents for an Initial Medicare Annual Wellness Visit.  Virtual Visit via Telephone Note  I connected with  [name] [name] on 07/22/20 at  8:15 AM EDT by telephone and verified that I am speaking with the correct person using two identifiers.  Location: Patient: Home Provider: [facility] Persons participating in the virtual visit: patient/Nurse Health Advisor   I discussed the limitations, risks, security and privacy concerns of performing an evaluation and management service by telephone and the availability of in person appointments. The patient expressed understanding and agreed to proceed.  Interactive audio and video telecommunications were attempted between this nurse and patient, however failed, due to patient having technical difficulties OR patient did not have access to video capability.  We continued and completed visit with audio only.  Some vital signs may be absent or patient reported.   Lore Polka E Piotr Christopher, LPN  Review of Systems     Cardiac Risk Factors include: advanced age (>70mn, >>43women);diabetes mellitus;family history of premature cardiovascular disease;hypertension;male gender;obesity (BMI >30kg/m2)     Objective:    Today's Vitals   07/22/20 0840  Weight: 240 lb (108.9 kg)  Height: 5' 9" (1.753 m)   Body mass index is 35.44 kg/m.  Advanced Directives 07/22/2020 06/17/2020 05/22/2020 02/07/2020 07/20/2019 05/17/2019 05/31/2017  Does Patient Have a Medical Advance Directive? _0  No No  Would patient like information on creating a medical advance directive? No - Patient declined Yes (MAU/Ambulatory/Procedural Areas - Information given) No - Patient declined No - Patient declined Yes (MAU/Ambulatory/Procedural Areas - Information given) No - Patient declined No - Patient declined    Current Medications (verified) Outpatient Encounter Medications as of 07/22/2020  Medication Sig  . Accu-Chek Softclix  Lancets lancets USE TO CHECK BLOOD SUGAR UP TO 4 TIMES DAILY.  .Marland Kitchenaspirin 81 MG tablet Take 81 mg by mouth daily.  .Marland Kitchenatorvastatin (LIPITOR) 80 MG tablet Take 1 tablet (80 mg total) by mouth daily.  . blood glucose meter kit and supplies Dispense based on patient and insurance preference. Use up to four times daily as directed. (FOR ICD-10 E10.9, E11.9).  . carvedilol (COREG) 12.5 MG tablet Take 1.5 tablets (18.75 mg total) by mouth 2 (two) times daily.  . cetirizine (ZYRTEC) 10 MG tablet Take 10 mg by mouth daily.  . Cinnamon 500 MG TABS Take 1,000 mg by mouth 2 (two) times daily.   .Marland KitchenCRANBERRY PO Take 4,200 mg by mouth 2 (two) times daily.   .Marland KitchenGLOBAL EASE INJECT PEN NEEDLES 31G X 8 MM MISC Use up to 8 times a day Dx E11.8  . glucose blood (ACCU-CHEK AVIVA PLUS) test strip Check BS up to 4 times a day dx E11.8  . Insulin Aspart FlexPen 100 UNIT/ML SOPN INJECT UP TO 45 UNITS EVERY DAY (Patient taking differently: Inject 45-75 Units into the skin 2 (two) times daily with a meal. INJECT 45-75 units subq with breakfast and supper. Sliding scale)  . isosorbide mononitrate (IMDUR) 30 MG 24 hr tablet Take 1 tablet (30 mg total) by mouth daily.  .Marland KitchenLANTUS SOLOSTAR 100 UNIT/ML Solostar Pen INJECT 44 UNITS EVERY MORNING AND 75 UNITS EVERY EVENING.  .Marland Kitchenlisinopril-hydrochlorothiazide (ZESTORETIC) 20-12.5 MG tablet Take 1 tablet by mouth daily.  . metFORMIN (GLUCOPHAGE) 500 MG tablet TAKE 2 TABLETS BY MOUTHTWICE DAILY.  . nitroGLYCERIN (NITROSTAT) 0.4 MG SL tablet Place 1 tablet (0.4 mg total) under the tongue  every 5 (five) minutes as needed for chest pain.  Marland Kitchen OZEMPIC, 0.25 OR 0.5 MG/DOSE, 2 MG/1.5ML SOPN INJECT 0.375 MLS (0.5 MG TOTAL) INTO THE SKIN ONCE A WEEK.  . pantoprazole (PROTONIX) 40 MG tablet Take 1 tablet (40 mg total) by mouth daily as needed (for acid reflux).  . prasugrel (EFFIENT) 10 MG TABS tablet TAKE 1 TABLET ONCE DAILY. (Patient taking differently: Take 10 mg by mouth daily.)  . tamsulosin  (FLOMAX) 0.4 MG CAPS capsule TAKE 1 CAPSULE BY MOUTH AT BEDTIME.   Facility-Administered Encounter Medications as of 07/22/2020  Medication  . sodium chloride flush (NS) 0.9 % injection 3 mL    Allergies (verified) Patient has no known allergies.   History: Past Medical History:  Diagnosis Date  . Anxiety   . Coronary atherosclerosis of native coronary artery    DES distal circumflex 2005; DES LAD/diagonal bifurcation 09/2010; DES ostial LAD and DES left PL 01/2020  . Essential hypertension   . GERD (gastroesophageal reflux disease)   . History of kidney stones   . Mixed hyperlipidemia   . Myocardial infarction ([place])    Anterolateral with VF arrest 7/12  . Type 2 diabetes mellitus ([place])    Past Surgical History:  Procedure Laterality Date  . CARDIAC CATHETERIZATION  2012  . CAROTID STENT     stents x 2   . CHOLECYSTECTOMY N/A 12/04/2015   Procedure: LAPAROSCOPIC CHOLECYSTECTOMY;  Surgeon: Aviva Signs, MD;  Location: [facility];  Service: General;  Laterality: N/A;  . CHOLECYSTECTOMY, LAPAROSCOPIC  12/05/2015  . CORONARY ANGIOPLASTY  2012   STENT X 1 2012, STENT X 1 YRS BEFORE  . CORONARY ATHERECTOMY N/A 02/09/2020   Procedure: CORONARY ATHERECTOMY;  Surgeon: Wellington Hampshire, MD;  Location: [place] [facility];  Service: Cardiovascular;  Laterality: N/A;  . CORONARY STENT INTERVENTION N/A 02/09/2020   Procedure: CORONARY STENT INTERVENTION;  Surgeon: Wellington Hampshire, MD;  Location: [place] [facility];  Service: Cardiovascular;  Laterality: N/A;  . CORONARY STENT INTERVENTION N/A 05/22/2020   Procedure: CORONARY STENT INTERVENTION;  Surgeon: Jettie Booze, MD;  Location: [place] [facility];  Service: Cardiovascular;  Laterality: N/A;  . HYDROCELE EXCISION Bilateral 06/02/2017   Procedure: HYDROCELECTOMY ADULT;  Surgeon: Ceasar Mons, MD;  Location: [facility];  Service: Urology;  Laterality: Bilateral;  ONLY NEEDS 45 MIN  . INGUINAL HERNIA REPAIR     RIGHT GROIN  .  INTRAVASCULAR IMAGING/OCT N/A 05/22/2020   Procedure: INTRAVASCULAR IMAGING/OCT;  Surgeon: Jettie Booze, MD;  Location: [place] [facility];  Service: Cardiovascular;  Laterality: N/A;  . INTRAVASCULAR ULTRASOUND/IVUS N/A 02/09/2020   Procedure: Intravascular Ultrasound/IVUS;  Surgeon: Wellington Hampshire, MD;  Location: [place] [facility];  Service: Cardiovascular;  Laterality: N/A;  . KNEE ARTHROSCOPY Left 05/23/2019   Procedure: LEFT KNEE ARTHROSCOPY AND DEBRIDEMENT PARTIAL MEDIAL MENISECTOMY;  Surgeon: Newt Minion, MD;  Location: [place];  Service: Orthopedics;  Laterality: Left;  . LEFT HEART CATH AND CORONARY ANGIOGRAPHY N/A 02/07/2020   Procedure: LEFT HEART CATH AND CORONARY ANGIOGRAPHY;  Surgeon: Leonie Man, MD;  Location: [place] [facility];  Service: Cardiovascular;  Laterality: N/A;  . LEFT HEART CATH AND CORONARY ANGIOGRAPHY N/A 05/22/2020   Procedure: LEFT HEART CATH AND CORONARY ANGIOGRAPHY;  Surgeon: Jettie Booze, MD;  Location: [place] [facility];  Service: Cardiovascular;  Laterality: N/A;  . TRIGGER FINGER RELEASE Right 01/08/2015   Procedure: RELEASE TRIGGER FINGER/A-1 PULLEY RIGHT MIDDLE FINGER;  Surgeon: Daryll Brod, MD;  Location: [place];  Service: Orthopedics;  Laterality: Right;   Family History  Problem Relation Age of Onset  . Hyperlipidemia Mother   . Hypertension Mother   . Hyperlipidemia Father   . Hypertension Father   . Diabetes Father   . Dementia Father   . Diabetes Maternal Grandfather    Social History   Socioeconomic History  . Marital status: Married    Spouse name: Lelon Frohlich  . Number of children: 1  . Years of education: 48  . Highest education level: Master's degree (e.g., MA, MS, MEng, MEd, MSW, MBA)  Occupational History  . Occupation: Retired    Fish farm manager: [employer]  Tobacco Use  . Smoking status: Never Smoker  . Smokeless tobacco: Never Used  Vaping Use  . Vaping Use: Never used   Substance and Sexual Activity  . Alcohol use: No  . Drug use: No  . Sexual activity: Yes  Other Topics Concern  . Not on file  Social History Narrative   Retired Statistician; Lives with his wife   Social Determinants of Health   Financial Resource Strain: Not on file  Food Insecurity: Not on file  Transportation Needs: Not on file  Physical Activity: Not on file  Stress: No Stress Concern Present  . Feeling of Stress : Not at all  Social Connections: Moderately Isolated  . Frequency of Communication with Friends and Family: More than three times a week  . Frequency of Social Gatherings with Friends and Family: Once a week  . Attends Religious Services: Never  . Active Member of Clubs or Organizations: No  . Attends Archivist Meetings: Never  . Marital Status: Married    Tobacco Counseling Counseling given: Not Answered   Clinical Intake:  Pre-visit preparation completed: Yes  Pain : No/denies pain     BMI - recorded: 35.91 Nutritional Status: BMI > 30  Obese Nutritional Risks: None Diabetes: Yes CBG done?: No Did pt. bring in CBG monitor from home?: No  How often do you need to have someone help you when you read instructions, pamphlets, or other written materials from your doctor or pharmacy?: 1 - Never  Nutrition Risk Assessment:  Has the patient had any N/V/D within the last 2 months?  No  Does the patient have any non-healing wounds?  No  Has the patient had any unintentional weight loss or weight gain?  No   Diabetes:  Is the patient diabetic?  Yes  If diabetic, was a CBG obtained today?  No  Did the patient bring in their glucometer from home?  No  How often do you monitor your CBG's? Twice per day.   Financial Strains and Diabetes Management:  Are you having any financial strains with the device, your supplies or your medication? No .  Does the patient want to be seen by Chronic Care Management for management of their  diabetes?  No  Would the patient like to be referred to a Nutritionist or for Diabetic Management?  No   Diabetic Exams:  Diabetic Eye Exam: Completed 10/10/2019.  Diabetic Foot Exam: Completed 01/31/2020. Pt has been advised about the importance in completing this exam. Pt is scheduled for diabetic foot exam on 08/05/20.    Interpreter Needed?: No  Information entered by :: Amy Hopkins, LPN   Activities of Daily Living In your present state of health, do you have any difficulty performing the following activities: 07/22/2020  Hearing? N  Vision? N  Difficulty concentrating or making decisions? N  Walking or climbing stairs? Y  Comment knees hurts  Dressing or bathing? N  Doing errands, shopping? N  Preparing Food and eating ? N  Using the Toilet? N  In the past six months, have you accidently leaked urine? N  Do you have problems with loss of bowel control? N  Managing your Medications? N  Managing your Finances? N  Housekeeping or managing your Housekeeping? N  Some recent data might be hidden    Patient Care Team: Chevis Pretty, FNP as PCP - General (Family Medicine) Satira Sark, MD as PCP - Cardiology (Cardiology) Lavera Guise, [facility] (Pharmacist)  Indicate any recent Medical Services you may have received from other than [facility] providers in the past year (date may be approximate).     Assessment:   This is a routine wellness examination for [name] [name].  Hearing/Vision screen  Hearing Screening   125Hz 250Hz 500Hz 1000Hz 2000Hz 3000Hz 4000Hz 6000Hz 8000Hz  Right ear:           Left ear:           Comments: Declines hearing difficulties  Vision Screening Comments: Wears eyeglasses - Annual visits with Dr Katy Fitch - up to date with exams  Dietary issues and exercise activities discussed: Current Exercise Habits: Home exercise routine, Type of exercise: walking;Other - see comments (Cardiac rehab 3x per week), Time (Minutes): 30, Frequency (Times/Week): 7,  Weekly Exercise (Minutes/Week): 210, Intensity: Moderate, Exercise limited by: cardiac condition(s)  Goals    . Exercise 3x per week (30 min per time)     07/20/2019 AWV Goal: Exercise for General Health   Patient will verbalize understanding of the benefits of increased physical activity:  Exercising regularly is important. It will improve your overall fitness, flexibility, and endurance.  Regular exercise also will improve your overall health. It can help you control your weight, reduce stress, and improve your bone density.  Over the next year, patient will increase physical activity as tolerated with a goal of at least 150 minutes of moderate physical activity per week.   You can tell that you are exercising at a moderate intensity if your heart starts beating faster and you start breathing faster but can still hold a conversation.  Moderate-intensity exercise ideas include:  Walking 1 mile (1.6 km) in about 15 minutes  Biking  Hiking  Golfing  Dancing  Water aerobics  Patient will verbalize understanding of everyday activities that increase physical activity by providing examples like the following: ? Yard work, such as: ? Pushing a Conservation officer, nature ? Raking and bagging leaves ? Washing your car ? Pushing a stroller ? Shoveling snow ? Gardening ? Washing windows or floors  Patient will be able to explain general safety guidelines for exercising:   Before you start a new exercise program, talk with your health care provider.  Do not exercise so much that you hurt yourself, feel dizzy, or get very short of breath.  Wear comfortable clothes and wear shoes with good support.  Drink plenty of water while you exercise to prevent dehydration or heat stroke.  Work out until your breathing and your heartbeat get faster.     . Have 3 meals a day     07/20/2019 AWV Goal: Improved Nutrition/Diet  . Patient will verbalize understanding that diet plays an important role in  overall health and that a poor diet is a risk factor for many chronic medical conditions.  Marland Kitchen  Over the next year, patient will improve self management of their diet by incorporating decreased fat intake, fewer sweetened foods & beverages, increased physical activity, and better food choices. . Patient will utilize available community resources to help with food acquisition if needed (ex: food pantries, [address], etc) . Patient will work with nutrition specialist if a referral was made       Depression Screen PHQ 2/9 Scores 07/22/2020 07/22/2020 06/17/2020 05/06/2020 04/11/2020 02/13/2020 01/31/2020  PHQ - 2 Score 0 0 1 0 2 0 0  PHQ- 9 Score - - 9 - 8 - -    Fall Risk Fall Risk  07/22/2020 06/17/2020 05/06/2020 04/11/2020 02/13/2020  Falls in the past year? 0 0 0 0 0  Number falls in past yr: 0 0 - 0 -  Injury with Fall? 0 0 - 0 -  Risk for fall due to : Orthopedic patient Orthopedic patient;Medication side effect - Impaired balance/gait;Impaired mobility -  Follow up Falls prevention discussed Falls evaluation completed;Falls prevention discussed;Education provided - Falls evaluation completed;Education provided;Falls prevention discussed -    FALL RISK PREVENTION PERTAINING TO THE HOME:  Any stairs in or around the home? Yes  If so, are there any without handrails? No  Home free of loose throw rugs in walkways, pet beds, electrical cords, etc? Yes  Adequate lighting in your home to reduce risk of falls? Yes   ASSISTIVE DEVICES UTILIZED TO PREVENT FALLS:  Life alert? Yes  Use of a cane, walker or w/c? No  Grab bars in the bathroom? No  Shower chair or bench in shower? Yes  Elevated toilet seat or a handicapped toilet? Yes   TIMED UP AND GO:  Was the test performed? No . Telephonic visit.  Cognitive Function: Normal cognitive status assessed by direct observation by this Nurse Health Advisor. No abnormalities found.       6CIT Screen 07/20/2019  What Year? 0 points  What month? 0  points  What time? 0 points  Count back from 20 0 points  Months in reverse 0 points  Repeat phrase 0 points  Total Score 0    Immunizations Immunization History  Administered Date(s) Administered  . Fluad Quad(high Dose 65+) 12/25/2019  . Influenza, High Dose Seasonal PF 01/17/2019  . Influenza,inj,Quad PF,6+ Mos 04/11/2014, 02/07/2018  . PFIZER(Purple Top)SARS-COV-2 Vaccination 05/06/2019, 05/27/2019, 12/25/2019    TDAP status: Due, Education has been provided regarding the importance of this vaccine. Advised may receive this vaccine at local pharmacy or Health Dept. Aware to provide a copy of the vaccination record if obtained from local pharmacy or Health Dept. Verbalized acceptance and understanding.  Flu Vaccine status: Up to date  Pneumococcal vaccine status: Due, Education has been provided regarding the importance of this vaccine. Advised may receive this vaccine at local pharmacy or Health Dept. Aware to provide a copy of the vaccination record if obtained from local pharmacy or Health Dept. Verbalized acceptance and understanding.  Covid-19 vaccine status: Completed vaccines  Qualifies for Shingles Vaccine? Yes   Zostavax completed No   Shingrix Completed?: No.    Education has been provided regarding the importance of this vaccine. Patient has been advised to call insurance company to determine out of pocket expense if they have not yet received this vaccine. Advised may also receive vaccine at local pharmacy or Health Dept. Verbalized acceptance and understanding.  Screening Tests Health Maintenance  Topic Date Due  . TETANUS/TDAP  Never done  . PNA vac Low Risk Adult (  1 of 2 - PCV13) Never done  . Hepatitis C Screening  05/06/2021 (Originally [birth year])  . OPHTHALMOLOGY EXAM  10/09/2020  . INFLUENZA VACCINE  10/28/2020  . HEMOGLOBIN A1C  11/03/2020  . FOOT EXAM  01/30/2021  . Fecal DNA (Cologuard)  08/16/2022  . COVID-19 Vaccine  Completed  . HPV VACCINES  Aged  Out    Health Maintenance  Health Maintenance Due  Topic Date Due  . TETANUS/TDAP  Never done  . PNA vac Low Risk Adult (1 of 2 - PCV13) Never done    Colorectal cancer screening: Type of screening: Cologuard. Completed 08/16/19. Repeat every 3 years  Lung Cancer Screening: (Low Dose CT Chest recommended if Age 39-80 years, 30 pack-year currently smoking OR have quit w/in 15years.) does not qualify.   Additional Screening:  Hepatitis C Screening: does qualify; Needs to complete at next visit.  Vision Screening: Recommended annual ophthalmology exams for early detection of glaucoma and other disorders of the eye. Is the patient up to date with their annual eye exam?  Yes  Who is the provider or what is the name of the office in which the patient attends annual eye exams? [facility] If pt is not established with a provider, would they like to be referred to a provider to establish care? No .   Dental Screening: Recommended annual dental exams for proper oral hygiene  Community Resource Referral / Chronic Care Management: CRR required this visit?  No   CCM required this visit?  No      Plan:     I have personally reviewed and noted the following in the patient's chart:   . Medical and social history . Use of alcohol, tobacco or illicit drugs  . Current medications and supplements . Functional ability and status . Nutritional status . Physical activity . Advanced directives . List of other physicians . Hospitalizations, surgeries, and ER visits in previous 12 months . Vitals . Screenings to include cognitive, depression, and falls . Referrals and appointments  In addition, I have reviewed and discussed with patient certain preventive protocols, quality metrics, and best practice recommendations. A written personalized care plan for preventive services as well as general preventive health recommendations were provided to patient.     Sandrea Hammond, LPN   [id number]    Nurse Notes: Needs Hep C screening with next labs - due for TDAP, Shingrix, 2nd covid booster and pneumonia shots - patient to discuss with PCP at next visti.

## 2020-07-22 NOTE — Patient Instructions (Signed)
[name] [name] , Thank you for taking time to come for your Medicare Wellness Visit. I appreciate your ongoing commitment to your health goals. Please review the following plan we discussed and let me know if I can assist you in the future.   Screening recommendations/referrals: Colonoscopy: Cologuard done 08/16/19 -Repeat in 3 years Recommended yearly ophthalmology/optometry visit for glaucoma screening and checkup Recommended yearly dental visit for hygiene and checkup  Vaccinations: Influenza vaccine: Done 12/25/2019 - Repeat every fall Pneumococcal vaccine: DUE Tdap vaccine: DUE Shingles vaccine: DUE  Shingrix discussed. Please contact your pharmacy for coverage information.  Covid-19: Done 05/06/19, 05/27/19, 12/25/19 - DUE for 2nd booster  Advanced directives: Please bring a copy of your health care power of attorney and living will to the office to be added to your chart at your convenience.  Conditions/risks identified: Continue cardiac rehab and walking, Aim for 30 minutes of exercise or brisk walking each day, drink 6-8 glasses of water and eat lots of fruits and vegetables; healthy diabetic diet.  Next appointment: Follow up in one year for your annual wellness visit.   Preventive Care 32 Years and Older, Male  Preventive care refers to lifestyle choices and visits with your health care provider that can promote health and wellness. What does preventive care include?  A yearly physical exam. This is also called an annual well check.  Dental exams once or twice a year.  Routine eye exams. Ask your health care provider how often you should have your eyes checked.  Personal lifestyle choices, including:  Daily care of your teeth and gums.  Regular physical activity.  Eating a healthy diet.  Avoiding tobacco and drug use.  Limiting alcohol use.  Practicing safe sex.  Taking low doses of aspirin every day.  Taking vitamin and mineral supplements as recommended by your  health care provider. What happens during an annual well check? The services and screenings done by your health care provider during your annual well check will depend on your age, overall health, lifestyle risk factors, and family history of disease. Counseling  Your health care provider may ask you questions about your:  Alcohol use.  Tobacco use.  Drug use.  Emotional well-being.  Home and relationship well-being.  Sexual activity.  Eating habits.  History of falls.  Memory and ability to understand (cognition).  Work and work Statistician. Screening  You may have the following tests or measurements:  Height, weight, and BMI.  Blood pressure.  Lipid and cholesterol levels. These may be checked every 5 years, or more frequently if you are over 63 years old.  Skin check.  Lung cancer screening. You may have this screening every year starting at age 82 if you have a 30-pack-year history of smoking and currently smoke or have quit within the past 15 years.  Fecal occult blood test (FOBT) of the stool. You may have this test every year starting at age 24.  Flexible sigmoidoscopy or colonoscopy. You may have a sigmoidoscopy every 5 years or a colonoscopy every 10 years starting at age 26.  Prostate cancer screening. Recommendations will vary depending on your family history and other risks.  Hepatitis C blood test.  Hepatitis B blood test.  Sexually transmitted disease (STD) testing.  Diabetes screening. This is done by checking your blood sugar (glucose) after you have not eaten for a while (fasting). You may have this done every 1-3 years.  Abdominal aortic aneurysm (AAA) screening. You may need this if you are a  current or former smoker.  Osteoporosis. You may be screened starting at age 73 if you are at high risk. Talk with your health care provider about your test results, treatment options, and if necessary, the need for more tests. Vaccines  Your health  care provider may recommend certain vaccines, such as:  Influenza vaccine. This is recommended every year.  Tetanus, diphtheria, and acellular pertussis (Tdap, Td) vaccine. You may need a Td booster every 10 years.  Zoster vaccine. You may need this after age 60.  Pneumococcal 13-valent conjugate (PCV13) vaccine. One dose is recommended after age 58.  Pneumococcal polysaccharide (PPSV23) vaccine. One dose is recommended after age 68. Talk to your health care provider about which screenings and vaccines you need and how often you need them. This information is not intended to replace advice given to you by your health care provider. Make sure you discuss any questions you have with your health care provider. Document Released: 04/12/2015 Document Revised: 12/04/2015 Document Reviewed: 01/15/2015 Elsevier Interactive Patient Education  2017 [place] Prevention in the Home Falls can cause injuries. They can happen to people of all ages. There are many things you can do to make your home safe and to help prevent falls. What can I do on the outside of my home?  Regularly fix the edges of walkways and driveways and fix any cracks.  Remove anything that might make you trip as you walk through a door, such as a raised step or threshold.  Trim any bushes or trees on the path to your home.  Use bright outdoor lighting.  Clear any walking paths of anything that might make someone trip, such as rocks or tools.  Regularly check to see if handrails are loose or broken. Make sure that both sides of any steps have handrails.  Any raised decks and porches should have guardrails on the edges.  Have any leaves, snow, or ice cleared regularly.  Use sand or salt on walking paths during winter.  Clean up any spills in your garage right away. This includes oil or grease spills. What can I do in the bathroom?  Use night lights.  Install grab bars by the toilet and in the tub and  shower. Do not use towel bars as grab bars.  Use non-skid mats or decals in the tub or shower.  If you need to sit down in the shower, use a plastic, non-slip stool.  Keep the floor dry. Clean up any water that spills on the floor as soon as it happens.  Remove soap buildup in the tub or shower regularly.  Attach bath mats securely with double-sided non-slip rug tape.  Do not have throw rugs and other things on the floor that can make you trip. What can I do in the bedroom?  Use night lights.  Make sure that you have a light by your bed that is easy to reach.  Do not use any sheets or blankets that are too big for your bed. They should not hang down onto the floor.  Have a firm chair that has side arms. You can use this for support while you get dressed.  Do not have throw rugs and other things on the floor that can make you trip. What can I do in the kitchen?  Clean up any spills right away.  Avoid walking on wet floors.  Keep items that you use a lot in easy-to-reach places.  If you need to reach something above  you, use a strong step stool that has a grab bar.  Keep electrical cords out of the way.  Do not use floor polish or wax that makes floors slippery. If you must use wax, use non-skid floor wax.  Do not have throw rugs and other things on the floor that can make you trip. What can I do with my stairs?  Do not leave any items on the stairs.  Make sure that there are handrails on both sides of the stairs and use them. Fix handrails that are broken or loose. Make sure that handrails are as long as the stairways.  Check any carpeting to make sure that it is firmly attached to the stairs. Fix any carpet that is loose or worn.  Avoid having throw rugs at the top or bottom of the stairs. If you do have throw rugs, attach them to the floor with carpet tape.  Make sure that you have a light switch at the top of the stairs and the bottom of the stairs. If you do not  have them, ask someone to add them for you. What else can I do to help prevent falls?  Wear shoes that:  Do not have high heels.  Have rubber bottoms.  Are comfortable and fit you well.  Are closed at the toe. Do not wear sandals.  If you use a stepladder:  Make sure that it is fully opened. Do not climb a closed stepladder.  Make sure that both sides of the stepladder are locked into place.  Ask someone to hold it for you, if possible.  Clearly mark and make sure that you can see:  Any grab bars or handrails.  First and last steps.  Where the edge of each step is.  Use tools that help you move around (mobility aids) if they are needed. These include:  Canes.  Walkers.  Scooters.  Crutches.  Turn on the lights when you go into a dark area. Replace any light bulbs as soon as they burn out.  Set up your furniture so you have a clear path. Avoid moving your furniture around.  If any of your floors are uneven, fix them.  If there are any pets around you, be aware of where they are.  Review your medicines with your doctor. Some medicines can make you feel dizzy. This can increase your chance of falling. Ask your doctor what other things that you can do to help prevent falls. This information is not intended to replace advice given to you by your health care provider. Make sure you discuss any questions you have with your health care provider. Document Released: 01/10/2009 Document Revised: 08/22/2015 Document Reviewed: 04/20/2014 Elsevier Interactive Patient Education  2017 [employer].

## 2020-07-22 NOTE — Progress Notes (Signed)
Daily Session Note  Patient Details  Name: [name] [name] MRN: [id number] Date of Birth: Jul 07, 1955 Referring Provider:   Flowsheet Row CARDIAC REHAB PHASE II ORIENTATION from 06/17/2020 in [place]  Referring Provider Domenic Polite      Encounter Date: 07/22/2020  Check In:  Session Check In - 07/22/20 0930      Check-In   Supervising physician immediately available to respond to emergencies [facility] MD immediately available    Physician(s) Dr. Domenic Polite    Location [facility]    Staff Present Aundra Dubin, RN, BSN;Daquann Merriott Audria Nine, MS, Exercise Physiologist    Virtual Visit No    Medication changes reported     No    Fall or balance concerns reported    No    Tobacco Cessation No Change    Warm-up and Cool-down Performed as group-led instruction    Resistance Training Performed Yes    VAD Patient? No    PAD/SET Patient? No      Pain Assessment   Currently in Pain? No/denies    Multiple Pain Sites No           Capillary Blood Glucose: No results found for this or any previous visit (from the past 24 hour(s)).    Social History   Tobacco Use  Smoking Status Never Smoker  Smokeless Tobacco Never Used    Goals Met:  Independence with exercise equipment Exercise tolerated well No report of cardiac concerns or symptoms Strength training completed today  Goals Unmet:  Not Applicable  Comments: check out 1030   Dr. Kathie Dike is Medical Director for [facility].

## 2020-07-24 ENCOUNTER — Encounter (HOSPITAL_COMMUNITY)
Admission: RE | Admit: 2020-07-24 | Discharge: 2020-07-24 | Disposition: A | Discharge status: Home / Self Care | Payer: Medicare PPO | Source: Ambulatory Visit | Attending: Cardiology | Admitting: Cardiology

## 2020-07-24 ENCOUNTER — Other Ambulatory Visit: Payer: Self-pay

## 2020-07-24 DIAGNOSIS — Z955 Presence of coronary angioplasty implant and graft: Secondary | ICD-10-CM

## 2020-07-24 DIAGNOSIS — Z95820 Peripheral vascular angioplasty status with implants and grafts: Secondary | ICD-10-CM | POA: Diagnosis not present

## 2020-07-24 DIAGNOSIS — Z9889 Other specified postprocedural states: Secondary | ICD-10-CM

## 2020-07-24 NOTE — Progress Notes (Signed)
Daily Session Note  Patient Details  Name: [name] [name] MRN: [id number] Date of Birth: 1956-02-27 Referring Provider:   Flowsheet Row CARDIAC REHAB PHASE II ORIENTATION from 06/17/2020 in [place]  Referring Provider Domenic Polite      Encounter Date: 07/24/2020  Check In:  Session Check In - 07/24/20 0930      Check-In   Supervising physician immediately available to respond to emergencies [facility] MD immediately available    Physician(s) Dr. Domenic Polite    Location [facility]    Staff Present Cathren Harsh, MS, Exercise Physiologist;Dalton Kris Mouton, MS, ACSM-CEP, Exercise Physiologist    Virtual Visit No    Medication changes reported     No    Fall or balance concerns reported    No    Tobacco Cessation No Change    Warm-up and Cool-down Performed as group-led instruction    Resistance Training Performed Yes    VAD Patient? No    PAD/SET Patient? No      Pain Assessment   Currently in Pain? No/denies    Multiple Pain Sites No           Capillary Blood Glucose: No results found for this or any previous visit (from the past 24 hour(s)).    Social History   Tobacco Use  Smoking Status Never Smoker  Smokeless Tobacco Never Used    Goals Met:  Independence with exercise equipment Exercise tolerated well No report of cardiac concerns or symptoms Strength training completed today  Goals Unmet:  Not Applicable  Comments: checkout 5   Dr. Kathie Dike is Medical Director for [facility].

## 2020-07-26 ENCOUNTER — Encounter (HOSPITAL_COMMUNITY)
Admission: RE | Admit: 2020-07-26 | Discharge: 2020-07-26 | Disposition: A | Discharge status: Home / Self Care | Payer: Medicare PPO | Source: Ambulatory Visit | Attending: Cardiology | Admitting: Cardiology

## 2020-07-26 ENCOUNTER — Other Ambulatory Visit: Payer: Self-pay

## 2020-07-26 DIAGNOSIS — Z955 Presence of coronary angioplasty implant and graft: Secondary | ICD-10-CM | POA: Diagnosis not present

## 2020-07-26 DIAGNOSIS — Z95820 Peripheral vascular angioplasty status with implants and grafts: Secondary | ICD-10-CM

## 2020-07-26 DIAGNOSIS — Z9889 Other specified postprocedural states: Secondary | ICD-10-CM | POA: Diagnosis not present

## 2020-07-26 NOTE — Progress Notes (Signed)
Daily Session Note  Patient Details  Name: [name] [name] MRN: [id number] Date of Birth: [birth year] Referring Provider:   Flowsheet Row CARDIAC REHAB PHASE II ORIENTATION from 06/17/2020 in [place]  Referring Provider Domenic Polite      Encounter Date: 07/26/2020  Check In:  Session Check In - 07/26/20 0930      Check-In   Supervising physician immediately available to respond to emergencies [facility] MD immediately available    Physician(s) Dr. Domenic Polite    Location [facility]    Staff Present Cathren Harsh, MS, Exercise Physiologist;Dalton Kris Mouton, MS, ACSM-CEP, Exercise Physiologist    Virtual Visit No    Medication changes reported     No    Fall or balance concerns reported    No    Tobacco Cessation No Change    Warm-up and Cool-down Performed as group-led instruction    Resistance Training Performed Yes    VAD Patient? No    PAD/SET Patient? No      Pain Assessment   Currently in Pain? No/denies    Multiple Pain Sites No           Capillary Blood Glucose: No results found for this or any previous visit (from the past 24 hour(s)).    Social History   Tobacco Use  Smoking Status Never Smoker  Smokeless Tobacco Never Used    Goals Met:  Independence with exercise equipment Exercise tolerated well No report of cardiac concerns or symptoms Strength training completed today  Goals Unmet:  Not Applicable  Comments: check out 1030   Dr. Kathie Dike is Medical Director for [facility] Re[facility].

## 2020-07-29 ENCOUNTER — Encounter (HOSPITAL_COMMUNITY)
Admission: RE | Admit: 2020-07-29 | Discharge: 2020-07-29 | Disposition: A | Discharge status: Home / Self Care | Payer: Medicare PPO | Source: Ambulatory Visit | Attending: Cardiology | Admitting: Cardiology

## 2020-07-29 ENCOUNTER — Other Ambulatory Visit: Payer: Self-pay

## 2020-07-29 DIAGNOSIS — Z95820 Peripheral vascular angioplasty status with implants and grafts: Secondary | ICD-10-CM | POA: Diagnosis not present

## 2020-07-29 DIAGNOSIS — Z955 Presence of coronary angioplasty implant and graft: Secondary | ICD-10-CM | POA: Insufficient documentation

## 2020-07-29 DIAGNOSIS — Z9889 Other specified postprocedural states: Secondary | ICD-10-CM | POA: Insufficient documentation

## 2020-07-29 NOTE — Progress Notes (Signed)
Daily Session Note  Patient Details  Name: [name] [name] MRN: [id number] Date of Birth: 04-02-1955 Referring Provider:   Flowsheet Row CARDIAC REHAB PHASE II ORIENTATION from 06/17/2020 in [place]  Referring Provider Domenic Polite      Encounter Date: 07/29/2020  Check In:  Session Check In - 07/29/20 0930      Check-In   Supervising physician immediately available to respond to emergencies [facility] MD immediately available    Physician(s) Dr. Harl Bowie    Location [facility]    Staff Present Cathren Harsh, MS, Exercise Physiologist;Berlinda Farve Kris Mouton, MS, ACSM-CEP, Exercise Physiologist    Virtual Visit No    Medication changes reported     No    Fall or balance concerns reported    No    Tobacco Cessation No Change    Warm-up and Cool-down Performed as group-led instruction    Resistance Training Performed Yes    VAD Patient? No    PAD/SET Patient? No      Pain Assessment   Currently in Pain? No/denies    Multiple Pain Sites No           Capillary Blood Glucose: No results found for this or any previous visit (from the past 24 hour(s)).    Social History   Tobacco Use  Smoking Status Never Smoker  Smokeless Tobacco Never Used    Goals Met:  Independence with exercise equipment Exercise tolerated well No report of cardiac concerns or symptoms Strength training completed today  Goals Unmet:  Not Applicable  Comments: checkout time is 1030   Dr. Kathie Dike is Medical Director for [facility].

## 2020-07-30 ENCOUNTER — Other Ambulatory Visit: Payer: Self-pay | Admitting: Nurse Practitioner

## 2020-07-30 DIAGNOSIS — R3911 Hesitancy of micturition: Secondary | ICD-10-CM

## 2020-07-30 DIAGNOSIS — N401 Enlarged prostate with lower urinary tract symptoms: Secondary | ICD-10-CM

## 2020-07-30 DIAGNOSIS — Z794 Long term (current) use of insulin: Secondary | ICD-10-CM

## 2020-07-31 ENCOUNTER — Encounter (HOSPITAL_COMMUNITY)
Admission: RE | Admit: 2020-07-31 | Discharge: 2020-07-31 | Disposition: A | Discharge status: Home / Self Care | Payer: Medicare PPO | Source: Ambulatory Visit | Attending: Cardiology | Admitting: Cardiology

## 2020-07-31 ENCOUNTER — Other Ambulatory Visit: Payer: Self-pay

## 2020-07-31 DIAGNOSIS — Z955 Presence of coronary angioplasty implant and graft: Secondary | ICD-10-CM | POA: Diagnosis not present

## 2020-07-31 DIAGNOSIS — Z9889 Other specified postprocedural states: Secondary | ICD-10-CM | POA: Diagnosis not present

## 2020-07-31 DIAGNOSIS — Z95820 Peripheral vascular angioplasty status with implants and grafts: Secondary | ICD-10-CM | POA: Diagnosis not present

## 2020-07-31 NOTE — Progress Notes (Signed)
Daily Session Note  Patient Details  Name: [name] [name] MRN: [id number] Date of Birth: [birth year] Referring Provider:   Flowsheet Row CARDIAC REHAB PHASE II ORIENTATION from 06/17/2020 in [place]  Referring Provider Domenic Polite      Encounter Date: 07/31/2020  Check In:  Session Check In - 07/31/20 0930      Check-In   Supervising physician immediately available to respond to emergencies [facility] MD immediately available    Physician(s) Dr. Harl Bowie    Location [facility]    Staff Present Cathren Harsh, MS, Exercise Physiologist;Dalton Kris Mouton, MS, ACSM-CEP, Exercise Physiologist    Virtual Visit No    Medication changes reported     No    Fall or balance concerns reported    No    Tobacco Cessation No Change    Warm-up and Cool-down Performed as group-led instruction    Resistance Training Performed Yes    VAD Patient? No    PAD/SET Patient? No      Pain Assessment   Currently in Pain? No/denies    Multiple Pain Sites No           Capillary Blood Glucose: No results found for this or any previous visit (from the past 24 hour(s)).    Social History   Tobacco Use  Smoking Status Never Smoker  Smokeless Tobacco Never Used    Goals Met:  Independence with exercise equipment Exercise tolerated well No report of cardiac concerns or symptoms Strength training completed today  Goals Unmet:  Not Applicable  Comments: check out 1030   Dr. Kathie Dike is Medical Director for [facility] Pul[facility] Re[facility].

## 2020-07-31 NOTE — Progress Notes (Signed)
Cardiac Individual Treatment Plan  Patient Details  Name: [name] [name] MRN: [id number] Date of Birth: 11/21/1955 Referring Provider:   Flowsheet Row CARDIAC REHAB PHASE II ORIENTATION from 06/17/2020 in [place]  Referring Provider Domenic Polite      Initial Encounter Date:  [place] PHASE II ORIENTATION from 06/17/2020 in [facility]  Date 06/17/20      Visit Diagnosis: Status post angioplasty with stent  Patient's Home Medications on Admission:  Current Outpatient Medications:  .  Accu-Chek Softclix Lancets lancets, USE TO CHECK BLOOD SUGAR UP TO 4 TIMES DAILY., Disp: 100 each, Rfl: 0 .  aspirin 81 MG tablet, Take 81 mg by mouth daily., Disp: , Rfl:  .  atorvastatin (LIPITOR) 80 MG tablet, Take 1 tablet (80 mg total) by mouth daily., Disp: 90 tablet, Rfl: 3 .  blood glucose meter kit and supplies, Dispense based on patient and insurance preference. Use up to four times daily as directed. (FOR ICD-10 E10.9, E11.9)., Disp: 1 each, Rfl: 0 .  carvedilol (COREG) 12.5 MG tablet, Take 1.5 tablets (18.75 mg total) by mouth 2 (two) times daily., Disp: 270 tablet, Rfl: 3 .  cetirizine (ZYRTEC) 10 MG tablet, Take 10 mg by mouth daily., Disp: , Rfl:  .  Cinnamon 500 MG TABS, Take 1,000 mg by mouth 2 (two) times daily. , Disp: , Rfl:  .  CRANBERRY PO, Take 4,200 mg by mouth 2 (two) times daily. , Disp: , Rfl:  .  GLOBAL EASE INJECT PEN NEEDLES 31G X 8 MM MISC, Use up to 8 times a day Dx E11.8, Disp: 800 each, Rfl: 3 .  glucose blood (ACCU-CHEK AVIVA PLUS) test strip, Check BS up to 4 times a day dx E11.8, Disp: 100 strip, Rfl: 11 .  Insulin Aspart FlexPen 100 UNIT/ML SOPN, INJECT UP TO 45 UNITS EVERY DAY (Patient taking differently: Inject 45-75 Units into the skin 2 (two) times daily with a meal. INJECT 45-75 units subq with breakfast and supper. Sliding scale), Disp: 15 mL, Rfl: 11 .  isosorbide mononitrate (IMDUR) 30 MG 24 hr  tablet, Take 1 tablet (30 mg total) by mouth daily., Disp: 90 tablet, Rfl: 3 .  LANTUS SOLOSTAR 100 UNIT/ML Solostar Pen, INJECT 44 UNITS EVERY MORNING AND 75 UNITS EVERY EVENING., Disp: 30 mL, Rfl: 0 .  lisinopril-hydrochlorothiazide (ZESTORETIC) 20-12.5 MG tablet, Take 1 tablet by mouth daily., Disp: 180 tablet, Rfl: 1 .  metFORMIN (GLUCOPHAGE) 500 MG tablet, TAKE 2 TABLETS BY MOUTHTWICE DAILY., Disp: 120 tablet, Rfl: 0 .  nitroGLYCERIN (NITROSTAT) 0.4 MG SL tablet, Place 1 tablet (0.4 mg total) under the tongue every 5 (five) minutes as needed for chest pain., Disp: 25 tablet, Rfl: 3 .  OZEMPIC, 0.25 OR 0.5 MG/DOSE, 2 MG/1.5ML SOPN, INJECT 0.375 MLS (0.5 MG TOTAL) INTO THE SKIN ONCE A WEEK., Disp: 1.5 mL, Rfl: 1 .  pantoprazole (PROTONIX) 40 MG tablet, Take 1 tablet (40 mg total) by mouth daily as needed (for acid reflux)., Disp: 90 tablet, Rfl: 3 .  prasugrel (EFFIENT) 10 MG TABS tablet, TAKE 1 TABLET ONCE DAILY. (Patient taking differently: Take 10 mg by mouth daily.), Disp: 90 tablet, Rfl: 1 .  tamsulosin (FLOMAX) 0.4 MG CAPS capsule, TAKE 1 CAPSULE BY MOUTH AT BEDTIME., Disp: 30 capsule, Rfl: 0  Current Facility-Administered Medications:  .  sodium chloride flush (NS) 0.9 % injection 3 mL, 3 mL, Intravenous, Q12H, Satira Sark, MD  Past Medical History: Past Medical History:  Diagnosis Date  . Anxiety   . Coronary atherosclerosis of native coronary artery    DES distal circumflex 2005; DES LAD/diagonal bifurcation 09/2010; DES ostial LAD and DES left PL 01/2020  . Essential hypertension   . GERD (gastroesophageal reflux disease)   . History of kidney stones   . Mixed hyperlipidemia   . Myocardial infarction ([place])    Anterolateral with VF arrest 7/12  . Type 2 diabetes mellitus ([facility])     Tobacco Use: Social History   Tobacco Use  Smoking Status Never Smoker  Smokeless Tobacco Never Used    Labs: Recent Review Flowsheet Data    Labs for ITP Cardiac and Pulmonary Rehab  Latest Ref Rng & Units 10/24/2019 01/31/2020 02/07/2020 02/08/2020 05/06/2020   Cholestrol 100 - 199 mg/dL 97(L) 109 - 89 95(L)   LDLCALC 0 - 99 mg/dL 53 54 - 35 47   HDL >39 mg/dL 18(L) 21(L) - 16(L) 23(L)   Trlycerides 0 - 149 mg/dL 145 209(H) - 188(H) 145   Hemoglobin A1c <7.0 % 8.1(H) 7.6(H) 7.6(H) - 7.1(H)   TCO2 0 - 100 mmol/L - - - - -      Capillary Blood Glucose: Lab Results  Component Value Date   GLUCAP 161 (H) 06/17/2020   GLUCAP 154 (H) 05/22/2020   GLUCAP 192 (H) 05/22/2020   GLUCAP 130 (H) 04/11/2020   GLUCAP 226 (H) 02/10/2020    POCT Glucose    Row Name 06/17/20 1310             POCT Blood Glucose   Pre-Exercise 161 mg/dL              Exercise Target Goals: Exercise Program Goal: Individual exercise prescription set using results from initial 6 min walk test and THRR while considering  patient's activity barriers and safety.   Exercise Prescription Goal: Starting with aerobic activity 30 plus minutes a day, 3 days per week for initial exercise prescription. Provide home exercise prescription and guidelines that participant acknowledges understanding prior to discharge.  Activity Barriers & Risk Stratification:  Activity Barriers & Cardiac Risk Stratification - 06/17/20 1255      Activity Barriers & Cardiac Risk Stratification   Activity Barriers Deconditioning;Shortness of Breath;Joint Problems;Chest Pain/Angina    Cardiac Risk Stratification High           6 Minute Walk:  6 Minute Walk    Row Name 06/17/20 1404         6 Minute Walk   Phase Initial     Distance 1175 feet     Walk Time 6 minutes     # of Rest Breaks 0     MPH 2.2     METS 2.54     RPE 12     VO2 Peak 8.9     Symptoms Yes (comment)     Comments chest discomfort 4/10. He says that he feels like it is in his lungs/airway rather than his heart. Does not feel similar to any pain he had with cardiac event     Resting HR 84 bpm     Resting BP 112/60     Resting Oxygen  Saturation  94 %     Exercise Oxygen Saturation  during 6 min walk 97 %     Max Ex. HR 100 bpm     Max Ex. BP 146/70     2 Minute Post BP 130/70            Oxygen  Initial Assessment:   Oxygen Re-Evaluation:   Oxygen Discharge (Final Oxygen Re-Evaluation):   Initial Exercise Prescription:  Initial Exercise Prescription - 06/17/20 1400      Date of Initial Exercise RX and Referring Provider   Date 06/17/20    Referring Provider Domenic Polite    Expected Discharge Date 09/06/20      Treadmill   MPH 1.5    Grade 0    Minutes 17      NuStep   Level 2    SPM 80    Minutes 22      Prescription Details   Frequency (times per week) 3    Duration Progress to 30 minutes of continuous aerobic without signs/symptoms of physical distress      Intensity   THRR 40-80% of Max Heartrate 62-123    Ratings of Perceived Exertion 11-13      Resistance Training   Training Prescription Yes    Weight 4 lbs    Reps 10-15           Perform Capillary Blood Glucose checks as needed.  Exercise Prescription Changes:   Exercise Prescription Changes    Row Name 07/01/20 1200 07/10/20 1039 07/15/20 1000 07/29/20 1300       Response to Exercise   Blood Pressure (Admit) 110/70 -- 136/72 128/76    Blood Pressure (Exercise) 136/68 -- 132/74 140/76    Blood Pressure (Exit) 106/74 -- 118/68 104/70    Heart Rate (Admit) 74 bpm -- 82 bpm 81 bpm    Heart Rate (Exercise) 104 bpm -- 106 bpm 107 bpm    Heart Rate (Exit) 83 bpm -- 91 bpm 90 bpm    Rating of Perceived Exertion (Exercise) 12 -- 12 11    Duration Continue with 30 min of aerobic exercise without signs/symptoms of physical distress. -- Continue with 30 min of aerobic exercise without signs/symptoms of physical distress. Continue with 30 min of aerobic exercise without signs/symptoms of physical distress.    Intensity THRR unchanged -- THRR unchanged THRR unchanged         Progression   Progression Continue to progress workloads to  maintain intensity without signs/symptoms of physical distress. -- Continue to progress workloads to maintain intensity without signs/symptoms of physical distress. Continue to progress workloads to maintain intensity without signs/symptoms of physical distress.         Resistance Training   Training Prescription Yes -- Yes Yes    Weight 4 lbs -- 4 lbs 5 lbs    Reps 10-15 -- 10-15 10-15    Time 10 Minutes -- 10 Minutes 10 Minutes         Treadmill   MPH 1.7 -- 1.8 1.9    Grade 0 -- 0 0    Minutes 17 -- 17 17    METs 2.3 -- 2.38 2.45         NuStep   Level 3 -- 3 3    SPM 93 -- 106 105    Minutes 22 -- 22 22    METs 2.1 -- 2.1 2.28         Home Exercise Plan   Plans to continue exercise at -- Home (comment) -- --    Frequency -- Add 3 additional days to program exercise sessions. -- --    Initial Home Exercises Provided -- 07/10/20 -- --           Exercise Comments:   Exercise Comments    Row Name 07/10/20 1040  Exercise Comments Reviewed home exercise program with patient. Currently walking at home              Exercise Goals and Review:   Exercise Goals    Row Name 06/17/20 1412 07/01/20 1224 07/29/20 1340         Exercise Goals   Increase Physical Activity Yes Yes Yes     Intervention Provide advice, education, support and counseling about physical activity/exercise needs.;Develop an individualized exercise prescription for aerobic and resistive training based on initial evaluation findings, risk stratification, comorbidities and participant's personal goals. Provide advice, education, support and counseling about physical activity/exercise needs.;Develop an individualized exercise prescription for aerobic and resistive training based on initial evaluation findings, risk stratification, comorbidities and participant's personal goals. Provide advice, education, support and counseling about physical activity/exercise needs.;Develop an individualized  exercise prescription for aerobic and resistive training based on initial evaluation findings, risk stratification, comorbidities and participant's personal goals.     Expected Outcomes Short Term: Attend rehab on a regular basis to increase amount of physical activity.;Long Term: Add in home exercise to make exercise part of routine and to increase amount of physical activity.;Long Term: Exercising regularly at least 3-5 days a week. Short Term: Attend rehab on a regular basis to increase amount of physical activity.;Long Term: Add in home exercise to make exercise part of routine and to increase amount of physical activity.;Long Term: Exercising regularly at least 3-5 days a week. Short Term: Attend rehab on a regular basis to increase amount of physical activity.;Long Term: Add in home exercise to make exercise part of routine and to increase amount of physical activity.;Long Term: Exercising regularly at least 3-5 days a week.     Increase Strength and Stamina Yes Yes Yes     Intervention Provide advice, education, support and counseling about physical activity/exercise needs.;Develop an individualized exercise prescription for aerobic and resistive training based on initial evaluation findings, risk stratification, comorbidities and participant's personal goals. Provide advice, education, support and counseling about physical activity/exercise needs.;Develop an individualized exercise prescription for aerobic and resistive training based on initial evaluation findings, risk stratification, comorbidities and participant's personal goals. Provide advice, education, support and counseling about physical activity/exercise needs.;Develop an individualized exercise prescription for aerobic and resistive training based on initial evaluation findings, risk stratification, comorbidities and participant's personal goals.     Expected Outcomes Short Term: Perform resistance training exercises routinely during rehab  and add in resistance training at home;Short Term: Increase workloads from initial exercise prescription for resistance, speed, and METs.;Long Term: Improve cardiorespiratory fitness, muscular endurance and strength as measured by increased METs and functional capacity (6MWT) Short Term: Perform resistance training exercises routinely during rehab and add in resistance training at home;Short Term: Increase workloads from initial exercise prescription for resistance, speed, and METs.;Long Term: Improve cardiorespiratory fitness, muscular endurance and strength as measured by increased METs and functional capacity (6MWT) Short Term: Perform resistance training exercises routinely during rehab and add in resistance training at home;Short Term: Increase workloads from initial exercise prescription for resistance, speed, and METs.;Long Term: Improve cardiorespiratory fitness, muscular endurance and strength as measured by increased METs and functional capacity (6MWT)     Able to understand and use rate of perceived exertion (RPE) scale Yes Yes Yes     Intervention Provide education and explanation on how to use RPE scale Provide education and explanation on how to use RPE scale Provide education and explanation on how to use RPE scale     Expected Outcomes  Short Term: Able to use RPE daily in rehab to express subjective intensity level;Long Term:  Able to use RPE to guide intensity level when exercising independently Short Term: Able to use RPE daily in rehab to express subjective intensity level;Long Term:  Able to use RPE to guide intensity level when exercising independently Short Term: Able to use RPE daily in rehab to express subjective intensity level;Long Term:  Able to use RPE to guide intensity level when exercising independently     Knowledge and understanding of Target Heart Rate Range (THRR) Yes -- Yes     Intervention Provide education and explanation of THRR including how the numbers were predicted and  where they are located for reference Provide education and explanation of THRR including how the numbers were predicted and where they are located for reference Provide education and explanation of THRR including how the numbers were predicted and where they are located for reference     Expected Outcomes Short Term: Able to use daily as guideline for intensity in rehab;Long Term: Able to use THRR to govern intensity when exercising independently;Short Term: Able to state/look up THRR Short Term: Able to use daily as guideline for intensity in rehab;Long Term: Able to use THRR to govern intensity when exercising independently;Short Term: Able to state/look up THRR Short Term: Able to use daily as guideline for intensity in rehab;Long Term: Able to use THRR to govern intensity when exercising independently;Short Term: Able to state/look up THRR     Able to check pulse independently Yes Yes Yes     Intervention Provide education and demonstration on how to check pulse in carotid and radial arteries.;Review the importance of being able to check your own pulse for safety during independent exercise Provide education and demonstration on how to check pulse in carotid and radial arteries.;Review the importance of being able to check your own pulse for safety during independent exercise Provide education and demonstration on how to check pulse in carotid and radial arteries.;Review the importance of being able to check your own pulse for safety during independent exercise     Expected Outcomes Short Term: Able to explain why pulse checking is important during independent exercise;Long Term: Able to check pulse independently and accurately Short Term: Able to explain why pulse checking is important during independent exercise;Long Term: Able to check pulse independently and accurately Short Term: Able to explain why pulse checking is important during independent exercise;Long Term: Able to check pulse independently and  accurately     Understanding of Exercise Prescription Yes Yes Yes     Intervention Provide education, explanation, and written materials on patient's individual exercise prescription Provide education, explanation, and written materials on patient's individual exercise prescription Provide education, explanation, and written materials on patient's individual exercise prescription     Expected Outcomes Short Term: Able to explain program exercise prescription;Long Term: Able to explain home exercise prescription to exercise independently Short Term: Able to explain program exercise prescription;Long Term: Able to explain home exercise prescription to exercise independently Short Term: Able to explain program exercise prescription;Long Term: Able to explain home exercise prescription to exercise independently            Exercise Goals Re-Evaluation :  Exercise Goals Re-Evaluation    Row Name 07/01/20 1224 07/29/20 1340           Exercise Goal Re-Evaluation   Exercise Goals Review Increase Physical Activity;Increase Strength and Stamina;Able to understand and use rate of perceived exertion (RPE) scale;Knowledge and understanding  of Target Heart Rate Range (THRR);Able to check pulse independently;Understanding of Exercise Prescription Increase Physical Activity;Increase Strength and Stamina;Able to understand and use rate of perceived exertion (RPE) scale;Knowledge and understanding of Target Heart Rate Range (THRR);Able to check pulse independently;Understanding of Exercise Prescription      Comments Patient has completed 5 exercise sessions. He has been tolerating exercise well since his return. We have been gradually trying to increase the intensities on the treadmill hoping for no angina with increased intensities, since he experienced angina last time he was in cardiac rehab on the treadmill at an increased speed. So far no symptoms. He expressed apprehension with the treadmill because of what  occurred last time. Overal lhe maintains a positive attitude. He is currently exercising at 2.3 METs on the treadmill. Will continue to monitor and progress as able. Patient has completed 17 exercise sessions. He has tolerated exercise very well. We are still gradually increasing intensities on the treadmill still with the expectation of no angina. He has not experienced symptoms so far. He has gotten more comfortable with the treadmill. He has increased his strength training intensities to 5 lbs. He always has a positive attitude and is eager to come to rehab. He is currently exercisinga t 2.45 METs on the treadmill. Will continue to monitor and progress as able.      Expected Outcomes Through exercise at rehab and by engaging in a home exercise program the patient will reach their goals. Through exercise at rehab and by engaging in a home exercise program the patient will reach their goals.              Discharge Exercise Prescription (Final Exercise Prescription Changes):  Exercise Prescription Changes - 07/29/20 1300      Response to Exercise   Blood Pressure (Admit) 128/76    Blood Pressure (Exercise) 140/76    Blood Pressure (Exit) 104/70    Heart Rate (Admit) 81 bpm    Heart Rate (Exercise) 107 bpm    Heart Rate (Exit) 90 bpm    Rating of Perceived Exertion (Exercise) 11    Duration Continue with 30 min of aerobic exercise without signs/symptoms of physical distress.    Intensity THRR unchanged      Progression   Progression Continue to progress workloads to maintain intensity without signs/symptoms of physical distress.      Resistance Training   Training Prescription Yes    Weight 5 lbs    Reps 10-15    Time 10 Minutes      Treadmill   MPH 1.9    Grade 0    Minutes 17    METs 2.45      NuStep   Level 3    SPM 105    Minutes 22    METs 2.28           Nutrition:  Target Goals: Understanding of nutrition guidelines, daily intake of sodium <1546m, cholesterol  <2025m calories 30% from fat and 7% or less from saturated fats, daily to have 5 or more servings of fruits and vegetables.  Biometrics:  Pre Biometrics - 07/29/20 1343      Pre Biometrics   Weight 109.6 kg    BMI (Calculated) 35.67            Nutrition Therapy Plan and Nutrition Goals:  Nutrition Therapy & Goals - 06/26/20 0823      Personal Nutrition Goals   Comments We provide 2 educational sessions on heart healthy nutrition with  handouts.      Intervention Plan   Intervention Nutrition handout(s) given to patient.           Nutrition Assessments:  Nutrition Assessments - 06/17/20 1258      MEDFICTS Scores   Pre Score 9          MEDIFICTS Score Key:  ?70 Need to make dietary changes   40-70 Heart Healthy Diet  ? 40 Therapeutic Level Cholesterol Diet   Picture Your Plate Scores:  <71 Unhealthy dietary pattern with much room for improvement.  41-50 Dietary pattern unlikely to meet recommendations for good health and room for improvement.  51-60 More healthful dietary pattern, with some room for improvement.   >60 Healthy dietary pattern, although there may be some specific behaviors that could be improved.    Nutrition Goals Re-Evaluation:   Nutrition Goals Discharge (Final Nutrition Goals Re-Evaluation):   Psychosocial: Target Goals: Acknowledge presence or absence of significant depression and/or stress, maximize coping skills, provide positive support system. Participant is able to verbalize types and ability to use techniques and skills needed for reducing stress and depression.  Initial Review & Psychosocial Screening:  Initial Psych Review & Screening - 06/17/20 1256      Initial Review   Current issues with Current Sleep Concerns      Family Dynamics   Good Support System? Yes    Comments He has a good support system. His wife, son, daughter in law, and grandchildren all support him. His mother, father, brother, and sister live in  [place], [place]. He frequently talks with them and visits them.      Barriers   Psychosocial barriers to participate in program The patient should benefit from training in stress management and relaxation.      Screening Interventions   Interventions Encouraged to exercise;To provide support and resources with identified psychosocial needs    Expected Outcomes Long Term Goal: Stressors or current issues are controlled or eliminated.;Short Term goal: Identification and review with participant of any Quality of Life or Depression concerns found by scoring the questionnaire.;Long Term goal: The participant improves quality of Life and PHQ9 Scores as seen by post scores and/or verbalization of changes           Quality of Life Scores:  Quality of Life - 06/17/20 1414      Quality of Life   Select Quality of Life      Quality of Life Scores   Health/Function Pre 11.47 %    Socioeconomic Pre 20.29 %    Psych/Spiritual Pre 14.79 %    Family Pre 25.2 %    GLOBAL Pre 15.99 %          Scores of 19 and below usually indicate a poorer quality of life in these areas.  A difference of  2-3 points is a clinically meaningful difference.  A difference of 2-3 points in the total score of the Quality of Life Index has been associated with significant improvement in overall quality of life, self-image, physical symptoms, and general health in studies assessing change in quality of life.  PHQ-9: Recent Review Flowsheet Data    Depression screen [facility] 2/9 07/22/2020 07/22/2020 06/17/2020 05/06/2020 04/11/2020   Decreased Interest 0 0 0 0 1   Down, Depressed, Hopeless 0 0 1 0 1   PHQ - 2 Score 0 0 1 0 2   Altered sleeping - - 3 - 3    Tired, decreased energy - - 2 - 2  Change in appetite - - 1 - 0   Feeling bad or failure about yourself  - - 1 - 1   Trouble concentrating - - 1 - 0   Moving slowly or fidgety/restless - - 0 - 0   Suicidal thoughts - - 0 - 0   PHQ-9 Score - - 9 - 8   Difficult doing  work/chores - - Somewhat difficult - Somewhat difficult     Interpretation of Total Score  Total Score Depression Severity:  1-4 = Minimal depression, 5-9 = Mild depression, 10-14 = Moderate depression, 15-19 = Moderately severe depression, 20-27 = Severe depression   Psychosocial Evaluation and Intervention:  Psychosocial Evaluation - 06/17/20 1424      Psychosocial Evaluation & Interventions   Interventions Encouraged to exercise with the program and follow exercise prescription;Stress management education;Relaxation education    Comments Pt has no barriers to participating in the cardiac rehab program. He was previously discharged from the program on 05/08/2020 due to angina. He ended up getting another stent post discharge. He has been feeling a bit down over the past month or so. He does not have much energy and his SOB has increased. He does not feel like doing much and reports he lays around more than usual due to this. He also does not sleep well. 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 His PHQ-9 was a 9 and his QOL was a 15.99. I offered patient counseling services, but he declined. He reports that he copes well on his own, and has a good support system within his family. His wife, son, daughter in law, and grandchild live close by, and he reports that they support him. He has a somewhat positive outlook about attending rehab. 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 He is worried that he will have to have this fixed at a later point. Patient's stress level is directly related to his heatlh. He will benefit from exercise training. His goals are to lose some weight and to decrease his shortness of breath.    Expected Outcomes Through the patient participating in cardiac rehab and becoming more active, his psychosocial issues will improve. Through exercise his heatlh will  improve and through relaxation training his stress levels will be reduced.    Continue Psychosocial Services  Follow up required by staff           Psychosocial Re-Evaluation:  Psychosocial Re-Evaluation    Row Name 06/26/20 [phone] 07/23/20 0726           Psychosocial Re-Evaluation   Current issues with Current Sleep Concerns Current Sleep Concerns      Comments Patient has returned to the program completing 4 sessions. He continues to have issues with sleep and uses Melatonin with some relief. He continues to demonstrate a positive attitude and seems to enjoy coming to class. Will continue to montior. Patient has compled 15 sessions. He continues to have issues with sleep and uses Melatonin with some relief. He continues to demonstrate a positive attitude and seems to enjoy coming to class. Will continue to montior.      Expected Outcomes Patient's sleep concerns will be managed with no other psychosocial issues identified at discharge. Patient's sleep concerns will be managed with no other psychosocial issues identified at discharge.      Interventions Encouraged to attend Cardiac Rehabilitation for the  exercise;Stress management education;Relaxation education Encouraged to attend Cardiac Rehabilitation for the exercise;Stress management education;Relaxation education      Continue Psychosocial Services  No Follow up required No Follow up required             Psychosocial Discharge (Final Psychosocial Re-Evaluation):  Psychosocial Re-Evaluation - 07/23/20 0726      Psychosocial Re-Evaluation   Current issues with Current Sleep Concerns    Comments Patient has compled 15 sessions. He continues to have issues with sleep and uses Melatonin with some relief. He continues to demonstrate a positive attitude and seems to enjoy coming to class. Will continue to montior.    Expected Outcomes Patient's sleep concerns will be managed with no other psychosocial issues identified at discharge.     Interventions Encouraged to attend Cardiac Rehabilitation for the exercise;Stress management education;Relaxation education    Continue Psychosocial Services  No Follow up required           Vocational Rehabilitation: Provide vocational rehab assistance to qualifying candidates.   Vocational Rehab Evaluation & Intervention:  Vocational Rehab - 06/17/20 1301      Initial Vocational Rehab Evaluation & Intervention   Assessment shows need for Vocational Rehabilitation No      Vocational Rehab Re-Evaulation   Comments Patient is retired and has no desire to return back to a job with any significant physical requirements.           Education: Education Goals: Education classes will be provided on a weekly basis, covering required topics. Participant will state understanding/return demonstration of topics presented.  Learning Barriers/Preferences:  Learning Barriers/Preferences - 06/17/20 1300      Learning Barriers/Preferences   Learning Barriers None    Learning Preferences Audio;Verbal Instruction           Education Topics: Hypertension, Hypertension Reduction -Define heart disease and high blood pressure. Discus how high blood pressure affects the body and ways to reduce high blood pressure. Flowsheet Row CARDIAC REHAB PHASE II EXERCISE from 07/24/2020 in [place]  Date 07/03/20  Educator mk  Instruction Review Code 2- Demonstrated Understanding      Exercise and Your Heart -Discuss why it is important to exercise, the FITT principles of exercise, normal and abnormal responses to exercise, and how to exercise safely. Flowsheet Row CARDIAC REHAB PHASE II EXERCISE from 07/24/2020 in [place]  Date 07/10/20  Educator mk  Instruction Review Code 2- Demonstrated Understanding      Angina -Discuss definition of angina, causes of angina, treatment of angina, and how to decrease risk of having angina. Flowsheet Row  CARDIAC REHAB PHASE II EXERCISE from 07/24/2020 in [place]  Date 07/17/20  Educator [facility]  Instruction Review Code 2- Demonstrated Understanding      Cardiac Medications -Review what the following cardiac medications are used for, how they affect the body, and side effects that may occur when taking the medications.  Medications include Aspirin, Beta blockers, calcium channel blockers, ACE Inhibitors, angiotensin receptor blockers, diuretics, digoxin, and antihyperlipidemics. Flowsheet Row CARDIAC REHAB PHASE II EXERCISE from 07/24/2020 in [place]  Date 07/24/20  Educator [facility]  Instruction Review Code 2- Demonstrated Understanding      Congestive Heart Failure -Discuss the definition of CHF, how to live with CHF, the signs and symptoms of CHF, and how keep track of weight and sodium intake.   Heart Disease and Intimacy -Discus the effect sexual activity has on the heart, how changes occur  during intimacy as we age, and safety during sexual activity. Flowsheet Row CARDIAC REHAB PHASE II EXERCISE from 07/24/2020 in [place]  Date 05/08/20  Educator [facility]  Instruction Review Code 2- Demonstrated Understanding      Smoking Cessation / COPD -Discuss different methods to quit smoking, the health benefits of quitting smoking, and the definition of COPD.   Nutrition I: Fats -Discuss the types of cholesterol, what cholesterol does to the heart, and how cholesterol levels can be controlled.   Nutrition II: Labels -Discuss the different components of food labels and how to read food label   Heart Parts/Heart Disease and PAD -Discuss the anatomy of the heart, the pathway of blood circulation through the heart, and these are affected by heart disease.   Stress I: Signs and Symptoms -Discuss the causes of stress, how stress may lead to anxiety and depression, and ways to limit stress.   Stress II:  Relaxation -Discuss different types of relaxation techniques to limit stress. Flowsheet Row CARDIAC REHAB PHASE II EXERCISE from 07/24/2020 in [place]  Date 06/19/20  Educator mk  Instruction Review Code 2- Demonstrated Understanding      Warning Signs of Stroke / TIA -Discuss definition of a stroke, what the signs and symptoms are of a stroke, and how to identify when someone is having stroke. Flowsheet Row CARDIAC REHAB PHASE II EXERCISE from 07/24/2020 in [place]  Date 06/26/20  Educator mk  Instruction Review Code 2- Demonstrated Understanding      Knowledge Questionnaire Score:  Knowledge Questionnaire Score - 06/17/20 1304      Knowledge Questionnaire Score   Pre Score 22/24           Core Components/Risk Factors/Patient Goals at Admission:  Personal Goals and Risk Factors at Admission - 06/17/20 1304      Core Components/Risk Factors/Patient Goals on Admission    Weight Management Yes    Intervention Weight Management: Develop a combined nutrition and exercise program designed to reach desired caloric intake, while maintaining appropriate intake of nutrient and fiber, sodium and fats, and appropriate energy expenditure required for the weight goal.;Weight Management/Obesity: Establish reasonable short term and long term weight goals.;Weight Management: Provide education and appropriate resources to help participant work on and attain dietary goals.;Obesity: Provide education and appropriate resources to help participant work on and attain dietary goals.    Expected Outcomes Short Term: Continue to assess and modify interventions until short term weight is achieved;Long Term: Adherence to nutrition and physical activity/exercise program aimed toward attainment of established weight goal;Weight Loss: Understanding of general recommendations for a balanced deficit meal plan, which promotes 1-2 lb weight loss per week and includes  a negative energy balance of [phone] kcal/d;Understanding recommendations for meals to include 15-35% energy as protein, 25-35% energy from fat, 35-60% energy from carbohydrates, less than 214m of dietary cholesterol, 20-35 gm of total fiber daily;Understanding of distribution of calorie intake throughout the day with the consumption of 4-5 meals/snacks    Improve shortness of breath with ADL's Yes    Intervention Provide education, individualized exercise plan and daily activity instruction to help decrease symptoms of SOB with activities of daily living.    Expected Outcomes Short Term: Improve cardiorespiratory fitness to achieve a reduction of symptoms when performing ADLs;Long Term: Be able to perform more ADLs without symptoms or delay the onset of symptoms    Diabetes Yes    Intervention Provide education about signs/symptoms and action to  take for hypo/hyperglycemia.;Provide education about proper nutrition, including hydration, and aerobic/resistive exercise prescription along with prescribed medications to achieve blood glucose in normal ranges: Fasting glucose 65-99 mg/dL    Expected Outcomes Short Term: Participant verbalizes understanding of the signs/symptoms and immediate care of hyper/hypoglycemia, proper foot care and importance of medication, aerobic/resistive exercise and nutrition plan for blood glucose control.;Long Term: Attainment of HbA1C < 7%.    Stress Yes    Intervention Offer individual and/or small group education and counseling on adjustment to heart disease, stress management and health-related lifestyle change. Teach and support self-help strategies.;Refer participants experiencing significant psychosocial distress to appropriate mental health specialists for further evaluation and treatment. When possible, include family members and significant others in education/counseling sessions.    Expected Outcomes Short Term: Participant demonstrates changes in health-related  behavior, relaxation and other stress management skills, ability to obtain effective social support, and compliance with psychotropic medications if prescribed.;Long Term: Emotional wellbeing is indicated by absence of clinically significant psychosocial distress or social isolation.           Core Components/Risk Factors/Patient Goals Review:   Goals and Risk Factor Review    Row Name 06/26/20 0825 07/23/20 1638           Core Components/Risk Factors/Patient Goals Review   Personal Goals Review Weight Management/Obesity;Other;Diabetes Weight Management/Obesity;Other;Diabetes      Review Patient is new to the program completing 4 sessions. He was referred back to CR with DES. He has multiple risk factors for CAD and is participating in the program for risk modification. His personal goals for the program are to lose weight and decrease his SOB. We will continue to monitor his progress as he works toward meeting these goals. Patient has completed 15 sessions losing 1 lb since last 30 day review. He is doing well in the program with consistent attendance and progressions. His most recent A1C on file was 02/07/20 at 6.9%. His blood pressures are well controlled. He has had one episode of chest pain while walking on the treadmill. Pain subsisded immediately after he stopped. He says he has had a few episodes with activity. He has NTG but has not had to take any. The pain always subsides if he stops the activity. His cardiologist is aware of his chest pain. His personal goals for the program are to lose weight; and decrease his SOB overall. We will continue to monitor his progress as he works towards meeting these goals.      Expected Outcomes Patient will complete the program meeting both personal and program goals. Patient will complete the program meeting both personal and program goals.             Core Components/Risk Factors/Patient Goals at Discharge (Final Review):   Goals and Risk Factor  Review - 07/23/20 0728      Core Components/Risk Factors/Patient Goals Review   Personal Goals Review Weight Management/Obesity;Other;Diabetes    Review Patient has completed 15 sessions losing 1 lb since last 30 day review. He is doing well in the program with consistent attendance and progressions. His most recent A1C on file was 02/07/20 at 6.9%. His blood pressures are well controlled. He has had one episode of chest pain while walking on the treadmill. Pain subsisded immediately after he stopped. He says he has had a few episodes with activity. He has NTG but has not had to take any. The pain always subsides if he stops the activity. His cardiologist is aware of his chest  pain. His personal goals for the program are to lose weight; and decrease his SOB overall. We will continue to monitor his progress as he works towards meeting these goals.    Expected Outcomes Patient will complete the program meeting both personal and program goals.           ITP Comments:   Comments: ITP REVIEW Pt is making expected progress toward Cardiac Rehab goals after completing 18 sessions. Recommend continued exercise, life style modification, education, and increased stamina and strength.

## 2020-08-02 ENCOUNTER — Other Ambulatory Visit: Payer: Self-pay

## 2020-08-02 ENCOUNTER — Encounter (HOSPITAL_COMMUNITY)
Admission: RE | Admit: 2020-08-02 | Discharge: 2020-08-02 | Disposition: A | Discharge status: Home / Self Care | Payer: Medicare PPO | Source: Ambulatory Visit | Attending: Cardiology | Admitting: Cardiology

## 2020-08-02 DIAGNOSIS — Z955 Presence of coronary angioplasty implant and graft: Secondary | ICD-10-CM

## 2020-08-02 DIAGNOSIS — Z95820 Peripheral vascular angioplasty status with implants and grafts: Secondary | ICD-10-CM

## 2020-08-02 DIAGNOSIS — Z9889 Other specified postprocedural states: Secondary | ICD-10-CM | POA: Diagnosis not present

## 2020-08-02 NOTE — Progress Notes (Signed)
Daily Session Note  Patient Details  Name: [name] [name] MRN: [id number] Date of Birth: 09/24/1955 Referring Provider:   Flowsheet Row CARDIAC REHAB PHASE II ORIENTATION from 06/17/2020 in [place]  Referring Provider Domenic Polite      Encounter Date: 08/02/2020  Check In:  Session Check In - 08/02/20 0930      Check-In   Supervising physician immediately available to respond to emergencies [facility] MD immediately available    Physician(s) Dr. Harl Bowie    Location [facility]    Staff Present Cathren Harsh, MS, Exercise Physiologist;Dalton Kris Mouton, MS, ACSM-CEP, Exercise Physiologist    Virtual Visit No    Medication changes reported     No    Fall or balance concerns reported    No    Tobacco Cessation No Change    Warm-up and Cool-down Performed as group-led instruction    Resistance Training Performed Yes    VAD Patient? No    PAD/SET Patient? No      Pain Assessment   Currently in Pain? No/denies    Multiple Pain Sites No           Capillary Blood Glucose: No results found for this or any previous visit (from the past 24 hour(s)).    Social History   Tobacco Use  Smoking Status Never Smoker  Smokeless Tobacco Never Used    Goals Met:  Independence with exercise equipment Exercise tolerated well No report of cardiac concerns or symptoms Strength training completed today  Goals Unmet:  Not Applicable  Comments: check out 1030   Dr. Kathie Dike is Medical Director for [facility].

## 2020-08-05 ENCOUNTER — Ambulatory Visit: Payer: Medicare PPO | Admitting: Nurse Practitioner

## 2020-08-05 ENCOUNTER — Encounter (HOSPITAL_COMMUNITY)
Admission: RE | Admit: 2020-08-05 | Discharge: 2020-08-05 | Disposition: A | Discharge status: Home / Self Care | Payer: Medicare PPO | Source: Ambulatory Visit | Attending: Cardiology | Admitting: Cardiology

## 2020-08-05 ENCOUNTER — Telehealth: Payer: Self-pay | Admitting: *Deleted

## 2020-08-05 ENCOUNTER — Other Ambulatory Visit: Payer: Self-pay

## 2020-08-05 ENCOUNTER — Encounter: Payer: Self-pay | Admitting: Nurse Practitioner

## 2020-08-05 VITALS — BP 106/62 | HR 79 | Temp 98.0°F | Resp 20 | Ht 69.0 in | Wt 240.0 lb

## 2020-08-05 VITALS — Wt 241.0 lb

## 2020-08-05 DIAGNOSIS — R3911 Hesitancy of micturition: Secondary | ICD-10-CM

## 2020-08-05 DIAGNOSIS — Z95820 Peripheral vascular angioplasty status with implants and grafts: Secondary | ICD-10-CM

## 2020-08-05 DIAGNOSIS — N401 Enlarged prostate with lower urinary tract symptoms: Secondary | ICD-10-CM | POA: Diagnosis not present

## 2020-08-05 DIAGNOSIS — Z9889 Other specified postprocedural states: Secondary | ICD-10-CM

## 2020-08-05 DIAGNOSIS — E118 Type 2 diabetes mellitus with unspecified complications: Secondary | ICD-10-CM | POA: Diagnosis not present

## 2020-08-05 DIAGNOSIS — Z955 Presence of coronary angioplasty implant and graft: Secondary | ICD-10-CM | POA: Diagnosis not present

## 2020-08-05 DIAGNOSIS — Z794 Long term (current) use of insulin: Secondary | ICD-10-CM

## 2020-08-05 DIAGNOSIS — I10 Essential (primary) hypertension: Secondary | ICD-10-CM | POA: Diagnosis not present

## 2020-08-05 DIAGNOSIS — E1159 Type 2 diabetes mellitus with other circulatory complications: Secondary | ICD-10-CM

## 2020-08-05 DIAGNOSIS — K219 Gastro-esophageal reflux disease without esophagitis: Secondary | ICD-10-CM

## 2020-08-05 DIAGNOSIS — Z6836 Body mass index (BMI) 36.0-36.9, adult: Secondary | ICD-10-CM

## 2020-08-05 DIAGNOSIS — I2510 Atherosclerotic heart disease of native coronary artery without angina pectoris: Secondary | ICD-10-CM

## 2020-08-05 DIAGNOSIS — I25110 Atherosclerotic heart disease of native coronary artery with unstable angina pectoris: Secondary | ICD-10-CM | POA: Diagnosis not present

## 2020-08-05 DIAGNOSIS — E785 Hyperlipidemia, unspecified: Secondary | ICD-10-CM | POA: Diagnosis not present

## 2020-08-05 LAB — BAYER DCA HB A1C WAIVED: HB A1C (BAYER DCA - WAIVED): 7.4 % — ABNORMAL HIGH (ref <7.0)

## 2020-08-05 MED ORDER — ATORVASTATIN CALCIUM 80 MG PO TABS: 80.0000 mg | ORAL_TABLET | Freq: Every day | ORAL | 3 refills | Status: DC

## 2020-08-05 MED ORDER — LISINOPRIL-HYDROCHLOROTHIAZIDE 20-12.5 MG PO TABS
1.0000 | ORAL_TABLET | Freq: Every day | ORAL | 1 refills | Status: DC
Start: 2020-08-05 — End: 2020-08-26

## 2020-08-05 MED ORDER — PRASUGREL HCL 10 MG PO TABS: ORAL_TABLET | ORAL | 1 refills | Status: DC

## 2020-08-05 MED ORDER — ISOSORBIDE MONONITRATE ER 30 MG PO TB24: 30.0000 mg | ORAL_TABLET | Freq: Every day | ORAL | 3 refills | Status: DC

## 2020-08-05 MED ORDER — PANTOPRAZOLE SODIUM 40 MG PO TBEC: 40.0000 mg | DELAYED_RELEASE_TABLET | Freq: Every day | ORAL | 3 refills | Status: DC | PRN

## 2020-08-05 MED ORDER — CARVEDILOL 12.5 MG PO TABS: 18.7500 mg | ORAL_TABLET | Freq: Two times a day (BID) | ORAL | 3 refills | Status: DC

## 2020-08-05 MED ORDER — TAMSULOSIN HCL 0.4 MG PO CAPS: 0.4000 mg | ORAL_CAPSULE | Freq: Every day | ORAL | 1 refills | Status: DC

## 2020-08-05 MED ORDER — METFORMIN HCL 500 MG PO TABS
ORAL_TABLET | ORAL | 1 refills | Status: DC
Start: 2020-08-05 — End: 2020-10-25

## 2020-08-05 MED ORDER — LANTUS SOLOSTAR 100 UNIT/ML ~~LOC~~ SOPN
PEN_INJECTOR | SUBCUTANEOUS | 3 refills | Status: DC
Start: 2020-08-05 — End: 2020-11-06

## 2020-08-05 MED ORDER — OZEMPIC (0.25 OR 0.5 MG/DOSE) 2 MG/1.5ML ~~LOC~~ SOPN: PEN_INJECTOR | SUBCUTANEOUS | 1 refills | Status: DC

## 2020-08-05 NOTE — Telephone Encounter (Signed)
-----   Message from Satira Sark, MD sent at 08/05/2020  1:32 PM EDT ----- Thank you for the update.  I will see if I can get him scheduled for a follow-up visit soon with me or APP.  May want to hold off on CR until he is seen. ----- Message ----- From: [name], RN Sent: 08/05/2020   1:25 PM EDT To: Satira Sark, MD  Hey Dr. Domenic Polite,  [name] was referred to CR a second time with a stent placement. He is on visit 21. He last saw [name] 3/10 after his stent and denied any angina symptoms. Today, I learned that he has been complaining of chest pain at times during CR which was relieved with rest. This has been happening intermittently for the past 3 to 4 weeks. I talked to the patient myself today and he reports the pain starts in his back between his shoulder blades and radiates to his chest 5/10 usually. He has this at rest as well as with activity. He does not take a NTG but takes Advil and uses a heating pad with some relief.  He saw [name], FNP today for a routine check. Her note said he needed to see his cardiologist ASAP due to the unstable angina. His blood pressures have been WNL with the highest systolic during exercise at 130.  Just wanted to left you know what has been going on.   Should he continue CR?  Thank you!!  Jackelyn Poling

## 2020-08-05 NOTE — H&P (View-Only) (Signed)
Cardiology Office Note  Date: 08/06/2020   ID: [name], [name] Jun 28, 1955, MRN [id number]  PCP:  Chevis Pretty, FNP  Cardiologist:  Rozann Lesches, MD Electrophysiologist:  None   Chief Complaint: Follow-up PCI/stent placement/accelerating angina  History of Present Illness: [name] [name] is a 65 y.o. male with a history of CAD/MI, HTN, HLD, DM2.  Last seen by Dr. Domenic Polite 05/16/2020.  Had been participating in cardiac rehab.  Rehab was called due to recurring anginal symptoms in early February.  Previous PCI November 2021.  Have been doing well on medical therapy.  He stated he was having recurrent anginal with other activities such as walking from parking lot to the visit that morning.  Describes symptoms as similar to preintervention the prior year.  Up titration of Coreg was discussed as well as relook angiography to reevaluate coronary anatomy given symptoms.  Subsequent relook cardiac catheterization on 05/22/2020: See report below. Had ISR  90% distal LM to proximal LAD. Treated with wolverine then balloon. Dissection noted by OCT past edge of old stent.  DES successfully placed. Mid LAD - 2 lesion ISR 75% with scoring balloon. Diffuse distal and small vessel disease in LAD and OM territories. Aggressive medical therapy / secondary prevention recommended.   Last visit he was here post follow-up from recent PCI.  Stated he felt tired all the time.  He related  it to increasing atorvastatin and carvedilol dosages.  Denies any anginal symptoms or nitroglycerin use.  He was eager to restart cardiac rehab.  Denied any  DOE or shortness of breath.  He had not been very active since the cardiac catheterization and stent placement/PCI.  Denies any palpitations or arrhythmias, orthostatic symptoms, CVA or TIA-like symptoms, bleeding, claudication-like symptoms, DVT or PE-like symptoms, or lower extremity edema.  Received a telephone message from nursing staff stating patient had  been complaining of chest pain at times during cardiac rehab which was relieved at rest.  This had been occurring intermittently for the prior 3 to 4 weeks.  [name], RN, spoke to the patient directly who reported pain started in his back between his shoulder blades and radiated to his chest 5 out of 10 usually.  He was having this at rest as well as with activity.  He did not take any nitroglycerin.  He was taking Advil and using a heating pad with some relief.  He saw his primary care provider on follow-up 08/05/2020 who stated he needed to see the cardiologist ASAP due to unstable angina.  His blood pressures had been within normal limits with with highest systolic blood pressure during exercise at 130.  He is here today status post recent phone call as mentioned in above he is undergoing cardiac rehab and has been having some recent chest pain when walking on the treadmill.  He states the chest pain redness nights in his back and radiates through to his chest when walking.  He is also complaining of some increased shortness of breath associated.  He states up until last evening he had not taken any nitroglycerin.  He states last evening he took 1 sublingual nitroglycerin and within 3 minutes the pain was relieved.  He had a recent PCI 05/22/2020 with ISR with 90% distal LM proximal LAD treated with Wolverine then balloon.  Dissection noted by OCT past the edge of old stent.  DES was subsequently placed.  Mid LAD lesion with ISR 75% with scoring balloon.  Diffuse distal and small vessel disease  in LAD and OM territories.  He states he was told some of the stenosis was not amenable to stent placement.  He states there was discussion prior to his recent intervention.  He states he is currently not inclined towards bypass surgery.  He is wondering why his RCA and first marginal lesion were not or intervened upon during last PCI.  He denies any other issues other than associated shortness of breath when he has  the chest pain.  He is currently continuing in cardiac rehab.  He is asking if he should continue cardiac rehab for now or hold off until a decision is made on how to proceed given his recent anginal symptoms.   Past Medical History:  Diagnosis Date  . Anxiety   . Coronary atherosclerosis of native coronary artery    DES distal circumflex 2005; DES LAD/diagonal bifurcation 09/2010; DES ostial LAD and DES left PL 01/2020  . Essential hypertension   . GERD (gastroesophageal reflux disease)   . History of kidney stones   . Mixed hyperlipidemia   . Myocardial infarction ([place])    Anterolateral with VF arrest 7/12  . Type 2 diabetes mellitus ([name])     Past Surgical History:  Procedure Laterality Date  . CARDIAC CATHETERIZATION  2012  . CAROTID STENT     stents x 2   . CHOLECYSTECTOMY N/A 12/04/2015   Procedure: LAPAROSCOPIC CHOLECYSTECTOMY;  Surgeon: Aviva Signs, MD;  Location: [facility];  Service: General;  Laterality: N/A;  . CHOLECYSTECTOMY, LAPAROSCOPIC  12/05/2015  . CORONARY ANGIOPLASTY  2012   STENT X 1 2012, STENT X 1 YRS BEFORE  . CORONARY ATHERECTOMY N/A 02/09/2020   Procedure: CORONARY ATHERECTOMY;  Surgeon: Wellington Hampshire, MD;  Location: [facility];  Service: Cardiovascular;  Laterality: N/A;  . CORONARY STENT INTERVENTION N/A 02/09/2020   Procedure: CORONARY STENT INTERVENTION;  Surgeon: Wellington Hampshire, MD;  Location: [facility];  Service: Cardiovascular;  Laterality: N/A;  . CORONARY STENT INTERVENTION N/A 05/22/2020   Procedure: CORONARY STENT INTERVENTION;  Surgeon: Jettie Booze, MD;  Location: [facility];  Service: Cardiovascular;  Laterality: N/A;  . HYDROCELE EXCISION Bilateral 06/02/2017   Procedure: HYDROCELECTOMY ADULT;  Surgeon: Ceasar Mons, MD;  Location: [facility];  Service: Urology;  Laterality: Bilateral;  ONLY NEEDS 45 MIN  . INGUINAL HERNIA REPAIR     RIGHT GROIN  . INTRAVASCULAR IMAGING/OCT N/A 05/22/2020    Procedure: INTRAVASCULAR IMAGING/OCT;  Surgeon: Jettie Booze, MD;  Location: [facility];  Service: Cardiovascular;  Laterality: N/A;  . INTRAVASCULAR ULTRASOUND/IVUS N/A 02/09/2020   Procedure: Intravascular Ultrasound/IVUS;  Surgeon: Wellington Hampshire, MD;  Location: [facility];  Service: Cardiovascular;  Laterality: N/A;  . KNEE ARTHROSCOPY Left 05/23/2019   Procedure: LEFT KNEE ARTHROSCOPY AND DEBRIDEMENT PARTIAL MEDIAL MENISECTOMY;  Surgeon: Newt Minion, MD;  Location: [facility];  Service: Orthopedics;  Laterality: Left;  . LEFT HEART CATH AND CORONARY ANGIOGRAPHY N/A 02/07/2020   Procedure: LEFT HEART CATH AND CORONARY ANGIOGRAPHY;  Surgeon: Leonie Man, MD;  Location: [facility];  Service: Cardiovascular;  Laterality: N/A;  . LEFT HEART CATH AND CORONARY ANGIOGRAPHY N/A 05/22/2020   Procedure: LEFT HEART CATH AND CORONARY ANGIOGRAPHY;  Surgeon: Jettie Booze, MD;  Location: [facility];  Service: Cardiovascular;  Laterality: N/A;  . TRIGGER FINGER RELEASE Right 01/08/2015   Procedure: RELEASE TRIGGER FINGER/A-1 PULLEY RIGHT MIDDLE FINGER;  Surgeon: Dominica Severin  Fredna Dow, MD;  Location: [facility];  Service: Orthopedics;  Laterality: Right;    Current Outpatient Medications  Medication Sig Dispense Refill  . Accu-Chek Softclix Lancets lancets USE TO CHECK BLOOD SUGAR UP TO 4 TIMES DAILY. 100 each 0  . aspirin 81 MG tablet Take 81 mg by mouth daily.    Marland Kitchen atorvastatin (LIPITOR) 80 MG tablet Take 1 tablet (80 mg total) by mouth daily. 90 tablet 3  . blood glucose meter kit and supplies Dispense based on patient and insurance preference. Use up to four times daily as directed. (FOR ICD-10 E10.9, E11.9). 1 each 0  . carvedilol (COREG) 12.5 MG tablet Take 1.5 tablets (18.75 mg total) by mouth 2 (two) times daily. 270 tablet 3  . cetirizine (ZYRTEC) 10 MG tablet Take 10 mg by mouth daily.    . Cinnamon 500 MG TABS Take 1,000  mg by mouth 2 (two) times daily.     Marland Kitchen CRANBERRY PO Take 4,200 mg by mouth 2 (two) times daily.     Marland Kitchen GLOBAL EASE INJECT PEN NEEDLES 31G X 8 MM MISC Use up to 8 times a day Dx E11.8 800 each 3  . glucose blood (ACCU-CHEK AVIVA PLUS) test strip Check BS up to 4 times a day dx E11.8 100 strip 11  . Insulin Aspart FlexPen 100 UNIT/ML SOPN INJECT UP TO 45 UNITS EVERY DAY (Patient taking differently: Inject 45-75 Units into the skin 2 (two) times daily with a meal. INJECT 45-75 units subq with breakfast and supper. Sliding scale) 15 mL 11  . insulin glargine (LANTUS SOLOSTAR) 100 UNIT/ML Solostar Pen 44u in morning and 75 at night 30 mL 3  . lisinopril-hydrochlorothiazide (ZESTORETIC) 20-12.5 MG tablet Take 1 tablet by mouth daily. 180 tablet 1  . metFORMIN (GLUCOPHAGE) 500 MG tablet TAKE 2 TABLETS BY MOUTHTWICE DAILY. 120 tablet 1  . nitroGLYCERIN (NITROSTAT) 0.4 MG SL tablet Place 1 tablet (0.4 mg total) under the tongue every 5 (five) minutes as needed for chest pain. 25 tablet 3  . pantoprazole (PROTONIX) 40 MG tablet Take 1 tablet (40 mg total) by mouth daily as needed (for acid reflux). 90 tablet 3  . prasugrel (EFFIENT) 10 MG TABS tablet TAKE 1 TABLET ONCE DAILY. 90 tablet 1  . Semaglutide,0.25 or 0.5MG/DOS, (OZEMPIC, 0.25 OR 0.5 MG/DOSE,) 2 MG/1.5ML SOPN INJECT 0.375 MLS (0.5 MG TOTAL) INTO THE SKIN ONCE A WEEK. 1.5 mL 1  . tamsulosin (FLOMAX) 0.4 MG CAPS capsule Take 1 capsule (0.4 mg total) by mouth at bedtime. 90 capsule 1  . isosorbide mononitrate (IMDUR) 60 MG 24 hr tablet Take 1 tablet (60 mg total) by mouth daily. 30 tablet 6   Current Facility-Administered Medications  Medication Dose Route Frequency Provider Last Rate Last Admin  . sodium chloride flush (NS) 0.9 % injection 3 mL  3 mL Intravenous Q12H Satira Sark, MD       Allergies:  Patient has no known allergies.   Social History: The patient  reports that he has never smoked. He has never used smokeless tobacco. He reports  that he does not drink alcohol and does not use drugs.   Family History: The patient's family history includes Dementia in his father; Diabetes in his father and maternal grandfather; Hyperlipidemia in his father and mother; Hypertension in his father and mother.   ROS:  Please see the history of present illness. Otherwise, complete review of systems is positive for none.  All other systems are reviewed  and negative.   Physical Exam: VS:  BP 114/62   Pulse 83   Ht 5' 9" (1.753 m)   Wt 243 lb (110.2 kg)   SpO2 94%   BMI 35.88 kg/m , BMI Body mass index is 35.88 kg/m.  Wt Readings from Last 3 Encounters:  08/06/20 243 lb (110.2 kg)  08/05/20 240 lb (108.9 kg)  07/22/20 240 lb (108.9 kg)    General: Obese patient appears comfortable at rest. Neck: Supple, no elevated JVP or carotid bruits, no thyromegaly. Lungs: Clear to auscultation, nonlabored breathing at rest. Cardiac: Regular rate and rhythm, no S3 or significant systolic murmur, no pericardial rub. Extremities: No pitting edema, distal pulses 2+. Skin: Warm and dry.  Right radial access site clean and dry with 2+ pulses Musculoskeletal: No kyphosis. Neuropsychiatric: Alert and oriented x3, affect grossly appropriate.  ECG: 08/06/2020 EKG normal sinus rhythm with a rate of 72.  Recent Labwork: 08/05/2020: ALT 33; AST 23; BUN 18; Creatinine, Ser 1.02; Hemoglobin 13.2; Platelets 263; Potassium 4.2; Sodium 136     Component Value Date/Time   CHOL 73 (L) 08/05/2020 1147   TRIG 135 08/05/2020 1147   HDL 19 (L) 08/05/2020 1147   CHOLHDL 3.8 08/05/2020 1147   CHOLHDL 5.6 02/08/2020 0626   VLDL 38 02/08/2020 0626   LDLCALC 30 08/05/2020 1147    Other Studies Reviewed Today:   Cardiac catheterization 05/22/2020 INTRAVASCULAR IMAGING/OCT  CORONARY STENT INTERVENTION  LEFT HEART CATH AND CORONARY ANGIOGRAPHY    Conclusion    Prox RCA lesion is 70% stenosed.  Dist LAD lesion is 60% stenosed.  1st Mrg lesion is 85%  stenosed.  1st LPL-2 lesion is 80% stenosed.  Previously placed 1st LPL-1 drug eluting stent is widely patent.  LPAV-2 lesion is 100% stenosed.  Dist LM to Prox LAD lesion is 90% stenosed, instent restenosis. Treated with 3.25 mm Wolverine and then 4.0 [place] balloon. Dissection noted by OCT past the edge of old stent. A drug-eluting stent was successfully placed using a SYNERGY XD 3.50X12., post dilated to 4.0 mm.  Post intervention, there is a 0% residual stenosis. Stent optimized with OCT.  Mid LAD-1 lesion is 20% stenosed.  Mid LAD-2 lesion is 75% stenosed. In-stent restenosis. Scoring balloon angioplasty was performed using a BALLOON WOLVERINE 3.25X10.  Post intervention, there is a 0% residual stenosis.  The left ventricular systolic function is normal.  LV end diastolic pressure is normal.  The left ventricular ejection fraction is 55-65% by visual estimate.  There is no aortic valve stenosis.  A drug-eluting stent was successfully placed using a SYNERGY XD 3.50X12.   Continue dual antiplatelet therapy along with aggressive secondary prevention.   Diffuse, distal and small vessel disease in the LAD and OM territories.  Aggressive medical therapy.   Diagnostic Dominance: Left    Intervention       Echocardiogram 02/08/2020: 1. Left ventricular ejection fraction, by estimation, is 60 to 65%. The  left ventricle has normal function. The left ventricle has no regional  wall motion abnormalities. Left ventricular diastolic parameters are  consistent with Grade I diastolic  dysfunction (impaired relaxation).  2. Right ventricular systolic function is normal. The right ventricular  size is normal. Tricuspid regurgitation signal is inadequate for assessing  PA pressure.  3. The mitral valve is normal in structure. No evidence of mitral valve  regurgitation. No evidence of mitral stenosis.  4. The aortic valve is tricuspid. Aortic valve regurgitation is not   visualized. Mild aortic valve  sclerosis is present, with no evidence of  aortic valve stenosis.  5. Aortic dilatation noted. There is mild dilatation of the ascending  aorta, measuring 40 mm.   PCI 02/09/2020:  Prox RCA lesion is 70% stenosed.  Dist LM to Prox LAD lesion is 95% stenosed.  Dist LAD lesion is 60% stenosed.  Mid LAD lesion is 20% stenosed.  Prox LAD to Mid LAD lesion is 20% stenosed.  1st Mrg lesion is 85% stenosed.  1st LPL-1 lesion is 95% stenosed.  1st LPL-2 lesion is 80% stenosed.  LPAV-1 lesion is 30% stenosed.  LPAV-2 lesion is 100% stenosed.  Post intervention, there is a 0% residual stenosis.  A drug-eluting stent was successfully placed using a SYNERGY XD 3.50X12.  Post intervention, there is a 0% residual stenosis.  A drug-eluting stent was successfully placed using a SYNERGY XD 2.25X16.  1. Successful IVUS guided orbital atherectomy and drug eluting stent placement to the ostial LAD. 2. Successful angioplasty and drug-eluting stent placement to first PL branch of the left circumflex.   Assessment and Plan:  1. Chest pain, unspecified type   2. CAD in native artery   3. Mixed hyperlipidemia   4. Type 2 diabetes mellitus with complication, without long-term current use of insulin ([place])   5. Gastroesophageal reflux disease without esophagitis   6. Coronary artery disease involving native coronary artery of native heart with unstable angina pectoris ([place])    1. Chest Pain / angina / CAD in native artery  Recent relook cardiac catheterization on 05/22/2020 for continuing angina.  See cath report above.  Had ISR  90% distal LM to proximal LAD. Treated with wolverine then balloon. Dissection noted by OCT past edge of old stent.  DES successfully placed. Mid LAD - 2 lesion ISR 75% with scoring balloon. Diffuse distal and small vessel disease in LAD and OM territories. Aggressive medical therapy / secondary prevention recommended.  Currently  denies any anginal symptoms or nitroglycerin use.  States he just feels tired all the time.  Continue aspirin 81 mg daily.   Continue nitroglycerin sublingual as needed.  Continue Effient 10 mg daily.  Continue carvedilol 18.75 mg p.o. twice daily.  Increase Imdur to 60 mg daily.  I discussed with him that I will talk to Dr. Domenic Polite regarding neck steps.  We discussed thoroughly the risk and benefits of cardiac catheterization.  I informed him that myself or nursing staff will call him regarding Dr. Myles Gip response.  2. Mixed hyperlipidemia Recent increase in atorvastatin to 80 mg daily for aggressive secondary prevention.  Recent lipid panel total cholesterol 95, triglycerides 145, HDL 23, LDL 47.  3. Type 2 diabetes mellitus with complication, without long-term current use of insulin ([place]) Recent glucose on 05/22/2020 was 154.  Hemoglobin A1c 7.1%.  Follow with PCP for management  4. Gastroesophageal reflux disease without esophagitis Please refill Protonix per patient request.  Follow with PCP for GERD  Medication Adjustments/Labs and Tests Ordered: Current medicines are reviewed at length with the patient today.  Concerns regarding medicines are outlined above.   Disposition: Follow-up with Dr. Domenic Polite or APP tentatively 3 months Signed, Levell July, NP 08/06/2020 3:46 PM    [facility], [place], [place] [address] Phone: [phone]; Fax: [phone]

## 2020-08-05 NOTE — Telephone Encounter (Signed)
Patient informed and verbalized understanding of plan. Appointment arranged with Katina Dung, NP 08/06/20 @3 :00 pm-patient aware.

## 2020-08-05 NOTE — Progress Notes (Addendum)
Cardiology Office Note  Date: 08/06/2020   ID: Shiv, [name] [name] 1956-03-18, MRN [id number]  PCP:  Chevis Pretty, FNP  Cardiologist:  Rozann Lesches, MD Electrophysiologist:  None   Chief Complaint: Follow-up PCI/stent placement/accelerating angina  History of Present Illness: [name] [name] is a 65 y.o. male with a history of CAD/MI, HTN, HLD, DM2.  Last seen by Dr. Domenic Polite 05/16/2020.  Had been participating in cardiac rehab.  Rehab was called due to recurring anginal symptoms in early February.  Previous PCI November 2021.  Have been doing well on medical therapy.  He stated he was having recurrent anginal with other activities such as walking from parking lot to the visit that morning.  Describes symptoms as similar to preintervention the prior year.  Up titration of Coreg was discussed as well as relook angiography to reevaluate coronary anatomy given symptoms.  Subsequent relook cardiac catheterization on 05/22/2020: See report below. Had ISR  90% distal LM to proximal LAD. Treated with wolverine then balloon. Dissection noted by OCT past edge of old stent.  DES successfully placed. Mid LAD - 2 lesion ISR 75% with scoring balloon. Diffuse distal and small vessel disease in LAD and OM territories. Aggressive medical therapy / secondary prevention recommended.   Last visit he was here post follow-up from recent PCI.  Stated he felt tired all the time.  He related  it to increasing atorvastatin and carvedilol dosages.  Denies any anginal symptoms or nitroglycerin use.  He was eager to restart cardiac rehab.  Denied any  DOE or shortness of breath.  He had not been very active since the cardiac catheterization and stent placement/PCI.  Denies any palpitations or arrhythmias, orthostatic symptoms, CVA or TIA-like symptoms, bleeding, claudication-like symptoms, DVT or PE-like symptoms, or lower extremity edema.  Received a telephone message from nursing staff stating patient had  been complaining of chest pain at times during cardiac rehab which was relieved at rest.  This had been occurring intermittently for the prior 3 to 4 weeks.  [name], RN, spoke to the patient directly who reported pain started in his back between his shoulder blades and radiated to his chest 5 out of 10 usually.  He was having this at rest as well as with activity.  He did not take any nitroglycerin.  He was taking Advil and using a heating pad with some relief.  He saw his primary care provider on follow-up 08/05/2020 who stated he needed to see the cardiologist ASAP due to unstable angina.  His blood pressures had been within normal limits with with highest systolic blood pressure during exercise at 130.  He is here today status post recent phone call as mentioned in above he is undergoing cardiac rehab and has been having some recent chest pain when walking on the treadmill.  He states the chest pain redness nights in his back and radiates through to his chest when walking.  He is also complaining of some increased shortness of breath associated.  He states up until last evening he had not taken any nitroglycerin.  He states last evening he took 1 sublingual nitroglycerin and within 3 minutes the pain was relieved.  He had a recent PCI 05/22/2020 with ISR with 90% distal LM proximal LAD treated with Wolverine then balloon.  Dissection noted by OCT past the edge of old stent.  DES was subsequently placed.  Mid LAD lesion with ISR 75% with scoring balloon.  Diffuse distal and small vessel disease  in LAD and OM territories.  He states he was told some of the stenosis was not amenable to stent placement.  He states there was discussion prior to his recent intervention.  He states he is currently not inclined towards bypass surgery.  He is wondering why his RCA and first marginal lesion were not or intervened upon during last PCI.  He denies any other issues other than associated shortness of breath when he has  the chest pain.  He is currently continuing in cardiac rehab.  He is asking if he should continue cardiac rehab for now or hold off until a decision is made on how to proceed given his recent anginal symptoms.   Past Medical History:  Diagnosis Date  . Anxiety   . Coronary atherosclerosis of native coronary artery    DES distal circumflex 2005; DES LAD/diagonal bifurcation 09/2010; DES ostial LAD and DES left PL 01/2020  . Essential hypertension   . GERD (gastroesophageal reflux disease)   . History of kidney stones   . Mixed hyperlipidemia   . Myocardial infarction (HCC)    Anterolateral with VF arrest 7/12  . Type 2 diabetes mellitus (HCC)     Past Surgical History:  Procedure Laterality Date  . CARDIAC CATHETERIZATION  2012  . CAROTID STENT     stents x 2   . CHOLECYSTECTOMY N/A 12/04/2015   Procedure: LAPAROSCOPIC CHOLECYSTECTOMY;  Surgeon: Mark Jenkins, MD;  Location: [facility];  Service: General;  Laterality: N/A;  . CHOLECYSTECTOMY, LAPAROSCOPIC  12/05/2015  . CORONARY ANGIOPLASTY  2012   STENT X 1 2012, STENT X 1 YRS BEFORE  . CORONARY ATHERECTOMY N/A 02/09/2020   Procedure: CORONARY ATHERECTOMY;  Surgeon: Arida, Muhammad A, MD;  Location: [facility];  Service: Cardiovascular;  Laterality: N/A;  . CORONARY STENT INTERVENTION N/A 02/09/2020   Procedure: CORONARY STENT INTERVENTION;  Surgeon: Arida, Muhammad A, MD;  Location: [facility];  Service: Cardiovascular;  Laterality: N/A;  . CORONARY STENT INTERVENTION N/A 05/22/2020   Procedure: CORONARY STENT INTERVENTION;  Surgeon: Varanasi, Jayadeep S, MD;  Location: [facility];  Service: Cardiovascular;  Laterality: N/A;  . HYDROCELE EXCISION Bilateral 06/02/2017   Procedure: HYDROCELECTOMY ADULT;  Surgeon: Winter, Christopher Aaron, MD;  Location: [facility];  Service: Urology;  Laterality: Bilateral;  ONLY NEEDS 45 MIN  . INGUINAL HERNIA REPAIR     RIGHT GROIN  . INTRAVASCULAR IMAGING/OCT N/A 05/22/2020    Procedure: INTRAVASCULAR IMAGING/OCT;  Surgeon: Varanasi, Jayadeep S, MD;  Location: [facility];  Service: Cardiovascular;  Laterality: N/A;  . INTRAVASCULAR ULTRASOUND/IVUS N/A 02/09/2020   Procedure: Intravascular Ultrasound/IVUS;  Surgeon: Arida, Muhammad A, MD;  Location: [facility];  Service: Cardiovascular;  Laterality: N/A;  . KNEE ARTHROSCOPY Left 05/23/2019   Procedure: LEFT KNEE ARTHROSCOPY AND DEBRIDEMENT PARTIAL MEDIAL MENISECTOMY;  Surgeon: Duda, Marcus V, MD;  Location: [facility];  Service: Orthopedics;  Laterality: Left;  . LEFT HEART CATH AND CORONARY ANGIOGRAPHY N/A 02/07/2020   Procedure: LEFT HEART CATH AND CORONARY ANGIOGRAPHY;  Surgeon: Harding, Coleson W, MD;  Location: [facility];  Service: Cardiovascular;  Laterality: N/A;  . LEFT HEART CATH AND CORONARY ANGIOGRAPHY N/A 05/22/2020   Procedure: LEFT HEART CATH AND CORONARY ANGIOGRAPHY;  Surgeon: Varanasi, Jayadeep S, MD;  [facility]: [facility];  Service: Cardiovascular;  Laterality: N/A;  . TRIGGER FINGER RELEASE Right 01/08/2015   Procedure: RELEASE TRIGGER FINGER/A-1 PULLEY RIGHT MIDDLE FINGER;  Surgeon: Gary   Kuzma, MD;  Location: [facility];  Service: Orthopedics;  Laterality: Right;    Current Outpatient Medications  Medication Sig Dispense Refill  . Accu-Chek Softclix Lancets lancets USE TO CHECK BLOOD SUGAR UP TO 4 TIMES DAILY. 100 each 0  . aspirin 81 MG tablet Take 81 mg by mouth daily.    . atorvastatin (LIPITOR) 80 MG tablet Take 1 tablet (80 mg total) by mouth daily. 90 tablet 3  . blood glucose meter kit and supplies Dispense based on patient and insurance preference. Use up to four times daily as directed. (FOR ICD-10 E10.9, E11.9). 1 each 0  . carvedilol (COREG) 12.5 MG tablet Take 1.5 tablets (18.75 mg total) by mouth 2 (two) times daily. 270 tablet 3  . cetirizine (ZYRTEC) 10 MG tablet Take 10 mg by mouth daily.    . Cinnamon 500 MG TABS Take 1,000  mg by mouth 2 (two) times daily.     . CRANBERRY PO Take 4,200 mg by mouth 2 (two) times daily.     . GLOBAL EASE INJECT PEN NEEDLES 31G X 8 MM MISC Use up to 8 times a day Dx E11.8 800 each 3  . glucose blood (ACCU-CHEK AVIVA PLUS) test strip Check BS up to 4 times a day dx E11.8 100 strip 11  . Insulin Aspart FlexPen 100 UNIT/ML SOPN INJECT UP TO 45 UNITS EVERY DAY (Patient taking differently: Inject 45-75 Units into the skin 2 (two) times daily with a meal. INJECT 45-75 units subq with breakfast and supper. Sliding scale) 15 mL 11  . insulin glargine (LANTUS SOLOSTAR) 100 UNIT/ML Solostar Pen 44u in morning and 75 at night 30 mL 3  . lisinopril-hydrochlorothiazide (ZESTORETIC) 20-12.5 MG tablet Take 1 tablet by mouth daily. 180 tablet 1  . metFORMIN (GLUCOPHAGE) 500 MG tablet TAKE 2 TABLETS BY MOUTHTWICE DAILY. 120 tablet 1  . nitroGLYCERIN (NITROSTAT) 0.4 MG SL tablet Place 1 tablet (0.4 mg total) under the tongue every 5 (five) minutes as needed for chest pain. 25 tablet 3  . pantoprazole (PROTONIX) 40 MG tablet Take 1 tablet (40 mg total) by mouth daily as needed (for acid reflux). 90 tablet 3  . prasugrel (EFFIENT) 10 MG TABS tablet TAKE 1 TABLET ONCE DAILY. 90 tablet 1  . Semaglutide,0.25 or 0.5MG/DOS, (OZEMPIC, 0.25 OR 0.5 MG/DOSE,) 2 MG/1.5ML SOPN INJECT 0.375 MLS (0.5 MG TOTAL) INTO THE SKIN ONCE A WEEK. 1.5 mL 1  . tamsulosin (FLOMAX) 0.4 MG CAPS capsule Take 1 capsule (0.4 mg total) by mouth at bedtime. 90 capsule 1  . isosorbide mononitrate (IMDUR) 60 MG 24 hr tablet Take 1 tablet (60 mg total) by mouth daily. 30 tablet 6   Current Facility-Administered Medications  Medication Dose Route Frequency Provider Last Rate Last Admin  . sodium chloride flush (NS) 0.9 % injection 3 mL  3 mL Intravenous Q12H McDowell, Samuel G, MD       Allergies:  Patient has no known allergies.   Social History: The patient  reports that he has never smoked. He has never used smokeless tobacco. He reports  that he does not drink alcohol and does not use drugs.   Family History: The patient's family history includes Dementia in his father; Diabetes in his father and maternal grandfather; Hyperlipidemia in his father and mother; Hypertension in his father and mother.   ROS:  Please see the history of present illness. Otherwise, complete review of systems is positive for none.  All other systems are reviewed   and negative.   Physical Exam: VS:  BP 114/62   Pulse 83   Ht 5' 9" (1.753 m)   Wt 243 lb (110.2 kg)   SpO2 94%   BMI 35.88 kg/m , BMI Body mass index is 35.88 kg/m.  Wt Readings from Last 3 Encounters:  08/06/20 243 lb (110.2 kg)  08/05/20 240 lb (108.9 kg)  07/22/20 240 lb (108.9 kg)    General: Obese patient appears comfortable at rest. Neck: Supple, no elevated JVP or carotid bruits, no thyromegaly. Lungs: Clear to auscultation, nonlabored breathing at rest. Cardiac: Regular rate and rhythm, no S3 or significant systolic murmur, no pericardial rub. Extremities: No pitting edema, distal pulses 2+. Skin: Warm and dry.  Right radial access site clean and dry with 2+ pulses Musculoskeletal: No kyphosis. Neuropsychiatric: Alert and oriented x3, affect grossly appropriate.  ECG: 08/06/2020 EKG normal sinus rhythm with a rate of 72.  Recent Labwork: 08/05/2020: ALT 33; AST 23; BUN 18; Creatinine, Ser 1.02; Hemoglobin 13.2; Platelets 263; Potassium 4.2; Sodium 136     Component Value Date/Time   CHOL 73 (L) 08/05/2020 1147   TRIG 135 08/05/2020 1147   HDL 19 (L) 08/05/2020 1147   CHOLHDL 3.8 08/05/2020 1147   CHOLHDL 5.6 02/08/2020 0626   VLDL 38 02/08/2020 0626   LDLCALC 30 08/05/2020 1147    Other Studies Reviewed Today:   Cardiac catheterization 05/22/2020 INTRAVASCULAR IMAGING/OCT  CORONARY STENT INTERVENTION  LEFT HEART CATH AND CORONARY ANGIOGRAPHY    Conclusion    Prox RCA lesion is 70% stenosed.  Dist LAD lesion is 60% stenosed.  1st Mrg lesion is 85%  stenosed.  1st LPL-2 lesion is 80% stenosed.  Previously placed 1st LPL-1 drug eluting stent is widely patent.  LPAV-2 lesion is 100% stenosed.  Dist LM to Prox LAD lesion is 90% stenosed, instent restenosis. Treated with 3.25 mm Wolverine and then 4.0 [place] balloon. Dissection noted by OCT past the edge of old stent. A drug-eluting stent was successfully placed using a SYNERGY XD 3.50X12., post dilated to 4.0 mm.  Post intervention, there is a 0% residual stenosis. Stent optimized with OCT.  Mid LAD-1 lesion is 20% stenosed.  Mid LAD-2 lesion is 75% stenosed. In-stent restenosis. Scoring balloon angioplasty was performed using a BALLOON WOLVERINE 3.25X10.  Post intervention, there is a 0% residual stenosis.  The left ventricular systolic function is normal.  LV end diastolic pressure is normal.  The left ventricular ejection fraction is 55-65% by visual estimate.  There is no aortic valve stenosis.  A drug-eluting stent was successfully placed using a SYNERGY XD 3.50X12.   Continue dual antiplatelet therapy along with aggressive secondary prevention.   Diffuse, distal and small vessel disease in the LAD and OM territories.  Aggressive medical therapy.   Diagnostic Dominance: Left    Intervention       Echocardiogram 02/08/2020: 1. Left ventricular ejection fraction, by estimation, is 60 to 65%. The  left ventricle has normal function. The left ventricle has no regional  wall motion abnormalities. Left ventricular diastolic parameters are  consistent with Grade I diastolic  dysfunction (impaired relaxation).  2. Right ventricular systolic function is normal. The right ventricular  size is normal. Tricuspid regurgitation signal is inadequate for assessing  PA pressure.  3. The mitral valve is normal in structure. No evidence of mitral valve  regurgitation. No evidence of mitral stenosis.  4. The aortic valve is tricuspid. Aortic valve regurgitation is not   visualized. Mild aortic valve   sclerosis is present, with no evidence of  aortic valve stenosis.  5. Aortic dilatation noted. There is mild dilatation of the ascending  aorta, measuring 40 mm.   PCI 02/09/2020:  Prox RCA lesion is 70% stenosed.  Dist LM to Prox LAD lesion is 95% stenosed.  Dist LAD lesion is 60% stenosed.  Mid LAD lesion is 20% stenosed.  Prox LAD to Mid LAD lesion is 20% stenosed.  1st Mrg lesion is 85% stenosed.  1st LPL-1 lesion is 95% stenosed.  1st LPL-2 lesion is 80% stenosed.  LPAV-1 lesion is 30% stenosed.  LPAV-2 lesion is 100% stenosed.  Post intervention, there is a 0% residual stenosis.  A drug-eluting stent was successfully placed using a SYNERGY XD 3.50X12.  Post intervention, there is a 0% residual stenosis.  A drug-eluting stent was successfully placed using a SYNERGY XD 2.25X16.  1. Successful IVUS guided orbital atherectomy and drug eluting stent placement to the ostial LAD. 2. Successful angioplasty and drug-eluting stent placement to first PL branch of the left circumflex.   Assessment and Plan:  1. Chest pain, unspecified type   2. CAD in native artery   3. Mixed hyperlipidemia   4. Type 2 diabetes mellitus with complication, without long-term current use of insulin ([place])   5. Gastroesophageal reflux disease without esophagitis   6. Coronary artery disease involving native coronary artery of native heart with unstable angina pectoris ([place])    1. Chest Pain / angina / CAD in native artery  Recent relook cardiac catheterization on 05/22/2020 for continuing angina.  See cath report above.  Had ISR  90% distal LM to proximal LAD. Treated with wolverine then balloon. Dissection noted by OCT past edge of old stent.  DES successfully placed. Mid LAD - 2 lesion ISR 75% with scoring balloon. Diffuse distal and small vessel disease in LAD and OM territories. Aggressive medical therapy / secondary prevention recommended.  Currently  denies any anginal symptoms or nitroglycerin use.  States he just feels tired all the time.  Continue aspirin 81 mg daily.   Continue nitroglycerin sublingual as needed.  Continue Effient 10 mg daily.  Continue carvedilol 18.75 mg p.o. twice daily.  Increase Imdur to 60 mg daily.  I discussed with him that I will talk to Dr. Domenic Polite regarding neck steps.  We discussed thoroughly the risk and benefits of cardiac catheterization.  I informed him that myself or nursing staff will call him regarding Dr. Myles Gip response.  2. Mixed hyperlipidemia Recent increase in atorvastatin to 80 mg daily for aggressive secondary prevention.  Recent lipid panel total cholesterol 95, triglycerides 145, HDL 23, LDL 47.  3. Type 2 diabetes mellitus with complication, without long-term current use of insulin ([place]) Recent glucose on 05/22/2020 was 154.  Hemoglobin A1c 7.1%.  Follow with PCP for management  4. Gastroesophageal reflux disease without esophagitis Please refill Protonix per patient request.  Follow with PCP for GERD  Medication Adjustments/Labs and Tests Ordered: Current medicines are reviewed at length with the patient today.  Concerns regarding medicines are outlined above.   Disposition: Follow-up with Dr. Domenic Polite or APP tentatively 3 months Signed, Levell July, NP 08/06/2020 3:46 PM    [facility], [place], [place] [address] Phone: [phone]; Fax: [phone]

## 2020-08-05 NOTE — Progress Notes (Signed)
Daily Session Note  Patient Details  Name: [name] [name] MRN: [id number] Date of Birth: Nov 24, 1955 Referring Provider:   Flowsheet Row CARDIAC REHAB PHASE II ORIENTATION from 06/17/2020 in [facility]  Referring Provider Domenic Polite      Encounter Date: 08/05/2020  Check In:  Session Check In - 08/05/20 0930      Check-In   Supervising physician immediately available to respond to emergencies [facility] MD immediately available    Physician(s) Dr. Domenic Polite    Location [facility]    Staff Present Cathren Harsh, MS, Exercise Physiologist;Dalton Kris Mouton, MS, ACSM-CEP, Exercise Physiologist    Virtual Visit No    Medication changes reported     No    Fall or balance concerns reported    No    Tobacco Cessation No Change    Warm-up and Cool-down Performed as group-led instruction    Resistance Training Performed Yes    VAD Patient? No    PAD/SET Patient? No      Pain Assessment   Currently in Pain? No/denies    Multiple Pain Sites No           Capillary Blood Glucose: No results found for this or any previous visit (from the past 24 hour(s)).    Social History   Tobacco Use  Smoking Status Never Smoker  Smokeless Tobacco Never Used    Goals Met:  Independence with exercise equipment Exercise tolerated well No report of cardiac concerns or symptoms Strength training completed today  Goals Unmet:  Not Applicable  Comments: check out 1030   Dr. Kathie Dike is Medical Director for [facility].

## 2020-08-05 NOTE — Progress Notes (Signed)
Subjective:    Patient ID: [name] [name], male    DOB: 10/23/1955, 65 y.o.   MRN: [id number]   Chief Complaint: medical management of chronic issues     HPI:  1. Essential hypertension, benign No c/o , sob or headache. Does not check blood pressure at home. BP Readings from Last 3 Encounters:  06/17/20 112/60  06/06/20 136/70  05/22/20 (!) 156/81     2. Coronary artery disease involving native coronary artery of native heart with unstable angina pectoris [facility]) Has had recent stent placement and is still in cardiac rehab. Has been having pain that starts in back and goes through to chest when he is walking of tread mill.Cardiac rehab is contatcing the cardiology.  3. Gastroesophageal reflux disease without esophagitis Is on protonix daily and seems to work well to keep symptoms under control.  4. Type 2 diabetes mellitus with complication, without long-term current use of insulin ([place]) is currently on ozempic, metformin and lantus. His blood sugars have been running around. Fasting blood sugars are running form 85-130 most days. Lab Results  Component Value Date   HGBA1C 7.1 (H) 05/06/2020    5. Benign prostatic hyperplasia with urinary hesitancy Is on flomax and denies any problems voiding  6. Hyperlipidemia with target LDL less than 70 Does try to watch diet. Is currently doing cardiac rehab, so is exercising severla times a week under supervision. Lab Results  Component Value Date   CHOL 95 (L) 05/06/2020   HDL 23 (L) 05/06/2020   LDLCALC 47 05/06/2020   TRIG 145 05/06/2020   CHOLHDL 4.1 05/06/2020     7. BMI 36.0-36.9,adult No recent weight changes Wt Readings from Last 3 Encounters:  08/05/20 240 lb (108.9 kg)  07/22/20 240 lb (108.9 kg)  07/29/20 241 lb 10 oz (109.6 kg)   BMI Readings from Last 3 Encounters:  08/05/20 35.44 kg/m  07/22/20 35.44 kg/m  07/29/20 35.68 kg/m        Outpatient Encounter Medications as of 08/05/2020  Medication Sig   . Accu-Chek Softclix Lancets lancets USE TO CHECK BLOOD SUGAR UP TO 4 TIMES DAILY.  Marland Kitchen aspirin 81 MG tablet Take 81 mg by mouth daily.  Marland Kitchen atorvastatin (LIPITOR) 80 MG tablet Take 1 tablet (80 mg total) by mouth daily.  . blood glucose meter kit and supplies Dispense based on patient and insurance preference. Use up to four times daily as directed. (FOR ICD-10 E10.9, E11.9).  . carvedilol (COREG) 12.5 MG tablet Take 1.5 tablets (18.75 mg total) by mouth 2 (two) times daily.  . cetirizine (ZYRTEC) 10 MG tablet Take 10 mg by mouth daily.  . Cinnamon 500 MG TABS Take 1,000 mg by mouth 2 (two) times daily.   Marland Kitchen CRANBERRY PO Take 4,200 mg by mouth 2 (two) times daily.   Marland Kitchen GLOBAL EASE INJECT PEN NEEDLES 31G X 8 MM MISC Use up to 8 times a day Dx E11.8  . glucose blood (ACCU-CHEK AVIVA PLUS) test strip Check BS up to 4 times a day dx E11.8  . Insulin Aspart FlexPen 100 UNIT/ML SOPN INJECT UP TO 45 UNITS EVERY DAY (Patient taking differently: Inject 45-75 Units into the skin 2 (two) times daily with a meal. INJECT 45-75 units subq with breakfast and supper. Sliding scale)  . isosorbide mononitrate (IMDUR) 30 MG 24 hr tablet Take 1 tablet (30 mg total) by mouth daily.  Marland Kitchen LANTUS SOLOSTAR 100 UNIT/ML Solostar Pen INJECT 44 UNITS EVERY MORNING AND 75 UNITS EVERY  EVENING.  Marland Kitchen lisinopril-hydrochlorothiazide (ZESTORETIC) 20-12.5 MG tablet Take 1 tablet by mouth daily.  . metFORMIN (GLUCOPHAGE) 500 MG tablet TAKE 2 TABLETS BY MOUTHTWICE DAILY.  . nitroGLYCERIN (NITROSTAT) 0.4 MG SL tablet Place 1 tablet (0.4 mg total) under the tongue every 5 (five) minutes as needed for chest pain.  Marland Kitchen OZEMPIC, 0.25 OR 0.5 MG/DOSE, 2 MG/1.5ML SOPN INJECT 0.375 MLS (0.5 MG TOTAL) INTO THE SKIN ONCE A WEEK.  . pantoprazole (PROTONIX) 40 MG tablet Take 1 tablet (40 mg total) by mouth daily as needed (for acid reflux).  . prasugrel (EFFIENT) 10 MG TABS tablet TAKE 1 TABLET ONCE DAILY. (Patient taking differently: Take 10 mg by mouth  daily.)  . tamsulosin (FLOMAX) 0.4 MG CAPS capsule TAKE 1 CAPSULE BY MOUTH AT BEDTIME.   Facility-Administered Encounter Medications as of 08/05/2020  Medication  . sodium chloride flush (NS) 0.9 % injection 3 mL    Past Surgical History:  Procedure Laterality Date  . CARDIAC CATHETERIZATION  2012  . CAROTID STENT     stents x 2   . CHOLECYSTECTOMY N/A 12/04/2015   Procedure: LAPAROSCOPIC CHOLECYSTECTOMY;  Surgeon: Aviva Signs, MD;  Location: [facility];  Service: General;  Laterality: N/A;  . CHOLECYSTECTOMY, LAPAROSCOPIC  12/05/2015  . CORONARY ANGIOPLASTY  2012   STENT X 1 2012, STENT X 1 YRS BEFORE  . CORONARY ATHERECTOMY N/A 02/09/2020   Procedure: CORONARY ATHERECTOMY;  Surgeon: Wellington Hampshire, MD;  Location: [place] [facility];  Service: Cardiovascular;  Laterality: N/A;  . CORONARY STENT INTERVENTION N/A 02/09/2020   Procedure: CORONARY STENT INTERVENTION;  Surgeon: Wellington Hampshire, MD;  Location: [place] [facility];  Service: Cardiovascular;  Laterality: N/A;  . CORONARY STENT INTERVENTION N/A 05/22/2020   Procedure: CORONARY STENT INTERVENTION;  Surgeon: Jettie Booze, MD;  Location: [place] [facility];  Service: Cardiovascular;  Laterality: N/A;  . HYDROCELE EXCISION Bilateral 06/02/2017   Procedure: HYDROCELECTOMY ADULT;  Surgeon: Ceasar Mons, MD;  Location: [facility];  Service: Urology;  Laterality: Bilateral;  ONLY NEEDS 45 MIN  . INGUINAL HERNIA REPAIR     RIGHT GROIN  . INTRAVASCULAR IMAGING/OCT N/A 05/22/2020   Procedure: INTRAVASCULAR IMAGING/OCT;  Surgeon: Jettie Booze, MD;  Location: [place] [facility];  Service: Cardiovascular;  Laterality: N/A;  . INTRAVASCULAR ULTRASOUND/IVUS N/A 02/09/2020   Procedure: Intravascular Ultrasound/IVUS;  Surgeon: Wellington Hampshire, MD;  Location: [place] [facility];  Service: Cardiovascular;  Laterality: N/A;  . KNEE ARTHROSCOPY Left 05/23/2019   Procedure: LEFT KNEE ARTHROSCOPY AND DEBRIDEMENT PARTIAL MEDIAL  MENISECTOMY;  Surgeon: Newt Minion, MD;  Location: [place];  Service: Orthopedics;  Laterality: Left;  . LEFT HEART CATH AND CORONARY ANGIOGRAPHY N/A 02/07/2020   Procedure: LEFT HEART CATH AND CORONARY ANGIOGRAPHY;  Surgeon: Leonie Man, MD;  Location: [place] [facility];  Service: Cardiovascular;  Laterality: N/A;  . LEFT HEART CATH AND CORONARY ANGIOGRAPHY N/A 05/22/2020   Procedure: LEFT HEART CATH AND CORONARY ANGIOGRAPHY;  Surgeon: Jettie Booze, MD;  Location: [place] [facility];  Service: Cardiovascular;  Laterality: N/A;  . TRIGGER FINGER RELEASE Right 01/08/2015   Procedure: RELEASE TRIGGER FINGER/A-1 PULLEY RIGHT MIDDLE FINGER;  Surgeon: Daryll Brod, MD;  Location: [place];  Service: Orthopedics;  Laterality: Right;    Family History  Problem Relation Age of Onset  . Hyperlipidemia Mother   . Hypertension Mother   . Hyperlipidemia Father   . Hypertension Father   .  Diabetes Father   . Dementia Father   . Diabetes Maternal Grandfather     New complaints: None otday  Social history: Lives with wife  Controlled substance contract: n/a    Review of Systems  Constitutional: Negative for diaphoresis.  Eyes: Negative for pain.  Respiratory: Negative for shortness of breath.   Cardiovascular: Positive for chest pain. Negative for palpitations and leg swelling.  Gastrointestinal: Negative for abdominal pain.  Endocrine: Negative for polydipsia.  Skin: Negative for rash.  Neurological: Negative for dizziness, weakness and headaches.  Hematological: Does not bruise/bleed easily.  All other systems reviewed and are negative.      Objective:   Physical Exam Vitals and nursing note reviewed.  Constitutional:      Appearance: Normal appearance. He is well-developed.  HENT:     Head: Normocephalic.     Nose: Nose normal.  Eyes:     Pupils: Pupils are equal, round, and reactive to light.  Neck:     Thyroid: No thyroid  mass or thyromegaly.     Vascular: No carotid bruit or JVD.     Trachea: Phonation normal.  Cardiovascular:     Rate and Rhythm: Normal rate and regular rhythm.  Pulmonary:     Effort: Pulmonary effort is normal. No respiratory distress.     Breath sounds: Normal breath sounds.  Abdominal:     General: Bowel sounds are normal.     Palpations: Abdomen is soft.     Tenderness: There is no abdominal tenderness.     Hernia: A hernia (incisional and umbilical) is present.  Musculoskeletal:        General: Normal range of motion.     Cervical back: Normal range of motion and neck supple.  Lymphadenopathy:     Cervical: No cervical adenopathy.  Skin:    General: Skin is warm and dry.  Neurological:     Mental Status: He is alert and oriented to person, place, and time.  Psychiatric:        Behavior: Behavior normal.        Thought Content: Thought content normal.        Judgment: Judgment normal.    BP 106/62   Pulse 79   Temp 98 F (36.7 C) (Temporal)   Resp 20   Ht '5\' 9"'  (1.753 m)   Wt 240 lb (108.9 kg)   SpO2 96%   BMI 35.44 kg/m   hgba1c 7.4%     Assessment & Plan:  BROXTON BROADY comes in today with chief complaint of Medical Management of Chronic Issues   Diagnosis and orders addressed:  1. Essential hypertension, benign Low sodium diet - CBC with Differential/Platelet - CMP14+EGFR - lisinopril-hydrochlorothiazide (ZESTORETIC) 20-12.5 MG tablet; Take 1 tablet by mouth daily.  Dispense: 180 tablet; Refill: 1  2. Coronary artery disease involving native coronary artery of native heart with unstable angina pectoris ([place]) Need to see cardiology ASAP - isosorbide mononitrate (IMDUR) 30 MG 24 hr tablet; Take 1 tablet (30 mg total) by mouth daily.  Dispense: 90 tablet; Refill: 3 - carvedilol (COREG) 12.5 MG tablet; Take 1.5 tablets (18.75 mg total) by mouth 2 (two) times daily.  Dispense: 270 tablet; Refill: 3  3. Gastroesophageal reflux disease without  esophagitis Avoid spicy foods Do not eat 2 hours prior to bedtime - pantoprazole (PROTONIX) 40 MG tablet; Take 1 tablet (40 mg total) by mouth daily as needed (for acid reflux).  Dispense: 90 tablet; Refill: 3  4. Benign prostatic  hyperplasia with urinary hesitancy - PSA, total and free - tamsulosin (FLOMAX) 0.4 MG CAPS capsule; Take 1 capsule (0.4 mg total) by mouth at bedtime.  Dispense: 90 capsule; Refill: 1  5. Hyperlipidemia with target LDL less than 70 Low fat diet - Lipid panel - atorvastatin (LIPITOR) 80 MG tablet; Take 1 tablet (80 mg total) by mouth daily.  Dispense: 90 tablet; Refill: 3  6. BMI 36.0-36.9,adult Discussed diet and exercise for person with BMI >25 Will recheck weight in 3-6 months  7. Atherosclerosis of native coronary artery of native heart without angina pectoris - prasugrel (EFFIENT) 10 MG TABS tablet; TAKE 1 TABLET ONCE DAILY.  Dispense: 90 tablet; Refill: 1  8. Type 2 diabetes mellitus with other circulatory complication, with long-term current use of insulin ([facility]) - Bayer DCA Hb A1c Waived - Microalbumin / creatinine urine ratio - insulin glargine (LANTUS SOLOSTAR) 100 UNIT/ML Solostar Pen; 44u in morning and 75 at night  Dispense: 30 mL; Refill: 3 - metFORMIN (GLUCOPHAGE) 500 MG tablet; TAKE 2 TABLETS BY MOUTHTWICE DAILY.  Dispense: 120 tablet; Refill: 1 - Semaglutide,0.25 or 0.5MG/DOS, (OZEMPIC, 0.25 OR 0.5 MG/DOSE,) 2 MG/1.5ML SOPN; INJECT 0.375 MLS (0.5 MG TOTAL) INTO THE SKIN ONCE A WEEK.  Dispense: 1.5 mL; Refill: 1   Labs pending Health Maintenance reviewed Diet and exercise encouraged  Follow up plan: 3 months   [name], FNP

## 2020-08-06 ENCOUNTER — Encounter: Payer: Self-pay | Admitting: Family Medicine

## 2020-08-06 ENCOUNTER — Ambulatory Visit: Payer: Medicare PPO | Admitting: Family Medicine

## 2020-08-06 VITALS — BP 114/62 | HR 83 | Ht 69.0 in | Wt 243.0 lb

## 2020-08-06 DIAGNOSIS — I2510 Atherosclerotic heart disease of native coronary artery without angina pectoris: Secondary | ICD-10-CM | POA: Diagnosis not present

## 2020-08-06 DIAGNOSIS — E118 Type 2 diabetes mellitus with unspecified complications: Secondary | ICD-10-CM

## 2020-08-06 DIAGNOSIS — E782 Mixed hyperlipidemia: Secondary | ICD-10-CM

## 2020-08-06 DIAGNOSIS — I25110 Atherosclerotic heart disease of native coronary artery with unstable angina pectoris: Secondary | ICD-10-CM | POA: Diagnosis not present

## 2020-08-06 DIAGNOSIS — K219 Gastro-esophageal reflux disease without esophagitis: Secondary | ICD-10-CM | POA: Diagnosis not present

## 2020-08-06 DIAGNOSIS — R079 Chest pain, unspecified: Secondary | ICD-10-CM

## 2020-08-06 LAB — CBC WITH DIFFERENTIAL/PLATELET
Basophils Absolute: 0 10*3/uL (ref 0.0–0.2)
Basos: 1 %
EOS (ABSOLUTE): 0.2 10*3/uL (ref 0.0–0.4)
Eos: 3 %
Hematocrit: 39.8 % (ref 37.5–51.0)
Hemoglobin: 13.2 g/dL (ref 13.0–17.7)
Immature Grans (Abs): 0 10*3/uL (ref 0.0–0.1)
Immature Granulocytes: 0 %
Lymphocytes Absolute: 1.4 10*3/uL (ref 0.7–3.1)
Lymphs: 26 %
MCH: 27.9 pg (ref 26.6–33.0)
MCHC: 33.2 g/dL (ref 31.5–35.7)
MCV: 84 fL (ref 79–97)
Monocytes Absolute: 0.7 10*3/uL (ref 0.1–0.9)
Monocytes: 13 %
Neutrophils Absolute: 3.1 10*3/uL (ref 1.4–7.0)
Neutrophils: 57 %
Platelets: 263 10*3/uL (ref 150–450)
RBC: 4.73 x10E6/uL (ref 4.14–5.80)
RDW: 14.3 % (ref 11.6–15.4)
WBC: 5.5 10*3/uL (ref 3.4–10.8)

## 2020-08-06 LAB — CMP14+EGFR
ALT: 33 IU/L (ref 0–44)
AST: 23 IU/L (ref 0–40)
Albumin/Globulin Ratio: 1.8 (ref 1.2–2.2)
Albumin: 4.2 g/dL (ref 3.8–4.8)
Alkaline Phosphatase: 59 IU/L (ref 44–121)
BUN/Creatinine Ratio: 18 (ref 10–24)
BUN: 18 mg/dL (ref 8–27)
Bilirubin Total: 0.6 mg/dL (ref 0.0–1.2)
CO2: 25 mmol/L (ref 20–29)
Calcium: 8.7 mg/dL (ref 8.6–10.2)
Chloride: 97 mmol/L (ref 96–106)
Creatinine, Ser: 1.02 mg/dL (ref 0.76–1.27)
Globulin, Total: 2.4 g/dL (ref 1.5–4.5)
Glucose: 84 mg/dL (ref 65–99)
Potassium: 4.2 mmol/L (ref 3.5–5.2)
Sodium: 136 mmol/L (ref 134–144)
Total Protein: 6.6 g/dL (ref 6.0–8.5)
eGFR: 81 mL/min/{1.73_m2} (ref >59)

## 2020-08-06 LAB — MICROALBUMIN / CREATININE URINE RATIO
Creatinine, Urine: 118.9 mg/dL
Microalb/Creat Ratio: 24 mg/g creat (ref 0–29)
Microalbumin, Urine: 28 ug/mL

## 2020-08-06 LAB — PSA, TOTAL AND FREE
PSA, Free Pct: 35 %
PSA, Free: 0.84 ng/mL
Prostate Specific Ag, Serum: 2.4 ng/mL (ref 0.0–4.0)

## 2020-08-06 LAB — LIPID PANEL
Chol/HDL Ratio: 3.8 ratio (ref 0.0–5.0)
Cholesterol, Total: 73 mg/dL — ABNORMAL LOW (ref 100–199)
HDL: 19 mg/dL — ABNORMAL LOW (ref >39)
LDL Chol Calc (NIH): 30 mg/dL (ref 0–99)
Triglycerides: 135 mg/dL (ref 0–149)
VLDL Cholesterol Cal: 24 mg/dL (ref 5–40)

## 2020-08-06 MED ORDER — ISOSORBIDE MONONITRATE ER 60 MG PO TB24: 60.0000 mg | ORAL_TABLET | Freq: Every day | ORAL | 6 refills | Status: DC

## 2020-08-06 NOTE — Patient Instructions (Addendum)
Medication Instructions:   Increase Imdur to 60mg  daily.   Continue all other current medications.  Labwork: none  Testing/Procedures: none  Follow-Up: 3 months   Any Other Special Instructions Will Be Listed Below (If Applicable).  If you need a refill on your cardiac medications before your next appointment, please call your pharmacy.

## 2020-08-06 NOTE — Addendum Note (Signed)
Addended by: Julian Hy T on: 08/06/2020 04:36 PM   Modules accepted: Orders

## 2020-08-07 ENCOUNTER — Encounter (HOSPITAL_COMMUNITY): Payer: Medicare PPO

## 2020-08-09 ENCOUNTER — Encounter: Payer: Self-pay | Admitting: *Deleted

## 2020-08-09 ENCOUNTER — Telehealth: Payer: Self-pay | Admitting: Cardiology

## 2020-08-09 ENCOUNTER — Encounter (HOSPITAL_COMMUNITY): Payer: Medicare PPO

## 2020-08-09 NOTE — Telephone Encounter (Signed)
New message    Spoke with [name] [name] at his last visit on 5/10 and was told to call and follow up this week on what the next steps are gonna be , are you going to do heart cath or where does he go from here?

## 2020-08-09 NOTE — Telephone Encounter (Signed)
Verta Ellen., NP  Laurine Blazer, LPN [name] [name] please see the note below. Dr. Domenic Polite agrees that patient should have a repeat cardiac catheterization. Will you please call him and tell him we plan to schedule him for cardiac catheterization and make sure he is agreeable? I have already spoken to him today during the visit regarding risk and benefits and given informed consent. If he is agreeable all we have to do is schedule it. Please let me know the date and time so I can put in the precath orders. Thank you

## 2020-08-09 NOTE — Telephone Encounter (Signed)
Left heart cath scheduled for Thursday, 08/15/2020 at 9:30 am.  [name] [name].  Needs to arrive to short stay at 7:30 am at [facility] - main entrance.    Patient notified via my chart.

## 2020-08-09 NOTE — Telephone Encounter (Signed)
Pre-cert Verification for the following procedure     Left Heart Cath scheduled for Thursday, 5/19 - [place]

## 2020-08-12 ENCOUNTER — Encounter: Payer: Self-pay | Admitting: *Deleted

## 2020-08-12 ENCOUNTER — Other Ambulatory Visit (HOSPITAL_COMMUNITY)
Admission: RE | Admit: 2020-08-12 | Discharge: 2020-08-12 | Disposition: A | Discharge status: Home / Self Care | Payer: Medicare PPO | Source: Ambulatory Visit | Attending: Interventional Cardiology | Admitting: Interventional Cardiology

## 2020-08-12 ENCOUNTER — Other Ambulatory Visit (HOSPITAL_COMMUNITY): Payer: Medicare PPO

## 2020-08-12 ENCOUNTER — Encounter (HOSPITAL_COMMUNITY): Payer: Medicare PPO

## 2020-08-12 DIAGNOSIS — I214 Non-ST elevation (NSTEMI) myocardial infarction: Secondary | ICD-10-CM | POA: Diagnosis not present

## 2020-08-12 DIAGNOSIS — Z8674 Personal history of sudden cardiac arrest: Secondary | ICD-10-CM | POA: Diagnosis not present

## 2020-08-12 DIAGNOSIS — E119 Type 2 diabetes mellitus without complications: Secondary | ICD-10-CM | POA: Diagnosis present

## 2020-08-12 DIAGNOSIS — Z794 Long term (current) use of insulin: Secondary | ICD-10-CM | POA: Diagnosis not present

## 2020-08-12 DIAGNOSIS — Z20822 Contact with and (suspected) exposure to covid-19: Secondary | ICD-10-CM | POA: Insufficient documentation

## 2020-08-12 DIAGNOSIS — Z833 Family history of diabetes mellitus: Secondary | ICD-10-CM | POA: Diagnosis not present

## 2020-08-12 DIAGNOSIS — Y831 Surgical operation with implant of artificial internal device as the cause of abnormal reaction of the patient, or of later complication, without mention of misadventure at the time of the procedure: Secondary | ICD-10-CM | POA: Diagnosis present

## 2020-08-12 DIAGNOSIS — Z01812 Encounter for preprocedural laboratory examination: Secondary | ICD-10-CM | POA: Insufficient documentation

## 2020-08-12 DIAGNOSIS — I10 Essential (primary) hypertension: Secondary | ICD-10-CM | POA: Diagnosis present

## 2020-08-12 DIAGNOSIS — E118 Type 2 diabetes mellitus with unspecified complications: Secondary | ICD-10-CM | POA: Diagnosis not present

## 2020-08-12 DIAGNOSIS — N400 Enlarged prostate without lower urinary tract symptoms: Secondary | ICD-10-CM | POA: Diagnosis present

## 2020-08-12 DIAGNOSIS — Z8249 Family history of ischemic heart disease and other diseases of the circulatory system: Secondary | ICD-10-CM | POA: Diagnosis not present

## 2020-08-12 DIAGNOSIS — Z7982 Long term (current) use of aspirin: Secondary | ICD-10-CM | POA: Diagnosis not present

## 2020-08-12 DIAGNOSIS — E785 Hyperlipidemia, unspecified: Secondary | ICD-10-CM | POA: Diagnosis not present

## 2020-08-12 DIAGNOSIS — E1169 Type 2 diabetes mellitus with other specified complication: Secondary | ICD-10-CM | POA: Diagnosis not present

## 2020-08-12 DIAGNOSIS — Z01810 Encounter for preprocedural cardiovascular examination: Secondary | ICD-10-CM | POA: Diagnosis not present

## 2020-08-12 DIAGNOSIS — Z83438 Family history of other disorder of lipoprotein metabolism and other lipidemia: Secondary | ICD-10-CM | POA: Diagnosis not present

## 2020-08-12 DIAGNOSIS — I252 Old myocardial infarction: Secondary | ICD-10-CM | POA: Diagnosis not present

## 2020-08-12 DIAGNOSIS — Z7902 Long term (current) use of antithrombotics/antiplatelets: Secondary | ICD-10-CM | POA: Diagnosis not present

## 2020-08-12 DIAGNOSIS — Z6835 Body mass index (BMI) 35.0-35.9, adult: Secondary | ICD-10-CM | POA: Diagnosis not present

## 2020-08-12 DIAGNOSIS — F419 Anxiety disorder, unspecified: Secondary | ICD-10-CM | POA: Diagnosis present

## 2020-08-12 DIAGNOSIS — I200 Unstable angina: Secondary | ICD-10-CM | POA: Diagnosis not present

## 2020-08-12 DIAGNOSIS — I25110 Atherosclerotic heart disease of native coronary artery with unstable angina pectoris: Secondary | ICD-10-CM | POA: Diagnosis present

## 2020-08-12 DIAGNOSIS — K219 Gastro-esophageal reflux disease without esophagitis: Secondary | ICD-10-CM | POA: Diagnosis present

## 2020-08-12 DIAGNOSIS — I2510 Atherosclerotic heart disease of native coronary artery without angina pectoris: Secondary | ICD-10-CM | POA: Diagnosis not present

## 2020-08-12 DIAGNOSIS — Z7984 Long term (current) use of oral hypoglycemic drugs: Secondary | ICD-10-CM | POA: Diagnosis not present

## 2020-08-12 DIAGNOSIS — N433 Hydrocele, unspecified: Secondary | ICD-10-CM | POA: Diagnosis not present

## 2020-08-12 DIAGNOSIS — E782 Mixed hyperlipidemia: Secondary | ICD-10-CM | POA: Diagnosis present

## 2020-08-12 DIAGNOSIS — E669 Obesity, unspecified: Secondary | ICD-10-CM | POA: Diagnosis present

## 2020-08-12 DIAGNOSIS — T82855A Stenosis of coronary artery stent, initial encounter: Secondary | ICD-10-CM | POA: Diagnosis present

## 2020-08-12 DIAGNOSIS — Z79899 Other long term (current) drug therapy: Secondary | ICD-10-CM | POA: Diagnosis not present

## 2020-08-12 DIAGNOSIS — I2582 Chronic total occlusion of coronary artery: Secondary | ICD-10-CM | POA: Diagnosis present

## 2020-08-13 ENCOUNTER — Telehealth: Payer: Self-pay | Admitting: *Deleted

## 2020-08-13 ENCOUNTER — Other Ambulatory Visit (HOSPITAL_COMMUNITY): Payer: Medicare PPO

## 2020-08-13 LAB — SARS CORONAVIRUS 2 (TAT 6-24 HRS): SARS Coronavirus 2: NEGATIVE

## 2020-08-13 NOTE — Telephone Encounter (Signed)
Pt contacted pre-catheterization scheduled at [facility] for: Thursday Aug 15, 2020 9 AM Verified arrival time and place: [facility]) at: 7 AM   No solid food after midnight prior to cath, clear liquids until 5 AM day of procedure.  Hold: Insulin-AM of procedure/1/2 usual HS dose prior to procedure Metformin-day of procedure and 48 hours post procedure Ozempic-will take day before or day after procedure since usual weekly day is Thursdays Lisinopril-HCT-AM of procedure  Except hold medications AM meds can be  taken pre-cath with sips of water including: ASA 81 mg Effient 10 mg   Confirmed patient has responsible adult to drive home post procedure and be with patient first 24 hours after arriving home: yes  You are allowed ONE visitor in the waiting room during the time you are at the hospital for your procedure. Both you and your visitor must wear a mask once you enter the hospital.   Reviewed procedure/mask/visitor instructions with patient.

## 2020-08-14 ENCOUNTER — Encounter (HOSPITAL_COMMUNITY): Payer: Medicare PPO

## 2020-08-15 ENCOUNTER — Inpatient Hospital Stay (HOSPITAL_COMMUNITY)
Admission: RE | Admit: 2020-08-15 | Discharge: 2020-08-16 | DRG: 287 | Disposition: A | Discharge status: Home / Self Care | Payer: Medicare PPO | Attending: Cardiovascular Disease | Admitting: Cardiovascular Disease

## 2020-08-15 ENCOUNTER — Inpatient Hospital Stay (HOSPITAL_COMMUNITY): Payer: Medicare PPO

## 2020-08-15 ENCOUNTER — Encounter (HOSPITAL_COMMUNITY)
Admission: RE | Disposition: A | Discharge status: Home / Self Care | Payer: Medicare PPO | Source: Home / Self Care | Attending: Cardiovascular Disease

## 2020-08-15 ENCOUNTER — Other Ambulatory Visit: Payer: Self-pay

## 2020-08-15 ENCOUNTER — Encounter (HOSPITAL_COMMUNITY): Payer: Self-pay | Admitting: Cardiovascular Disease

## 2020-08-15 DIAGNOSIS — N400 Enlarged prostate without lower urinary tract symptoms: Secondary | ICD-10-CM | POA: Diagnosis present

## 2020-08-15 DIAGNOSIS — E785 Hyperlipidemia, unspecified: Secondary | ICD-10-CM | POA: Diagnosis not present

## 2020-08-15 DIAGNOSIS — Z79899 Other long term (current) drug therapy: Secondary | ICD-10-CM

## 2020-08-15 DIAGNOSIS — E119 Type 2 diabetes mellitus without complications: Secondary | ICD-10-CM | POA: Diagnosis present

## 2020-08-15 DIAGNOSIS — I252 Old myocardial infarction: Secondary | ICD-10-CM | POA: Diagnosis not present

## 2020-08-15 DIAGNOSIS — K219 Gastro-esophageal reflux disease without esophagitis: Secondary | ICD-10-CM | POA: Diagnosis present

## 2020-08-15 DIAGNOSIS — Z83438 Family history of other disorder of lipoprotein metabolism and other lipidemia: Secondary | ICD-10-CM | POA: Diagnosis not present

## 2020-08-15 DIAGNOSIS — Z794 Long term (current) use of insulin: Secondary | ICD-10-CM | POA: Diagnosis not present

## 2020-08-15 DIAGNOSIS — Z01810 Encounter for preprocedural cardiovascular examination: Secondary | ICD-10-CM | POA: Diagnosis not present

## 2020-08-15 DIAGNOSIS — I2582 Chronic total occlusion of coronary artery: Secondary | ICD-10-CM | POA: Diagnosis present

## 2020-08-15 DIAGNOSIS — Z8674 Personal history of sudden cardiac arrest: Secondary | ICD-10-CM

## 2020-08-15 DIAGNOSIS — Z7982 Long term (current) use of aspirin: Secondary | ICD-10-CM | POA: Diagnosis not present

## 2020-08-15 DIAGNOSIS — E669 Obesity, unspecified: Secondary | ICD-10-CM | POA: Diagnosis present

## 2020-08-15 DIAGNOSIS — Z6835 Body mass index (BMI) 35.0-35.9, adult: Secondary | ICD-10-CM

## 2020-08-15 DIAGNOSIS — E118 Type 2 diabetes mellitus with unspecified complications: Secondary | ICD-10-CM | POA: Diagnosis not present

## 2020-08-15 DIAGNOSIS — T82855A Stenosis of coronary artery stent, initial encounter: Secondary | ICD-10-CM | POA: Diagnosis present

## 2020-08-15 DIAGNOSIS — I10 Essential (primary) hypertension: Secondary | ICD-10-CM | POA: Diagnosis present

## 2020-08-15 DIAGNOSIS — E782 Mixed hyperlipidemia: Secondary | ICD-10-CM | POA: Diagnosis present

## 2020-08-15 DIAGNOSIS — F419 Anxiety disorder, unspecified: Secondary | ICD-10-CM | POA: Diagnosis present

## 2020-08-15 DIAGNOSIS — I200 Unstable angina: Secondary | ICD-10-CM | POA: Diagnosis present

## 2020-08-15 DIAGNOSIS — Y831 Surgical operation with implant of artificial internal device as the cause of abnormal reaction of the patient, or of later complication, without mention of misadventure at the time of the procedure: Secondary | ICD-10-CM | POA: Diagnosis present

## 2020-08-15 DIAGNOSIS — Z7984 Long term (current) use of oral hypoglycemic drugs: Secondary | ICD-10-CM | POA: Diagnosis not present

## 2020-08-15 DIAGNOSIS — E1169 Type 2 diabetes mellitus with other specified complication: Secondary | ICD-10-CM | POA: Diagnosis present

## 2020-08-15 DIAGNOSIS — Z7902 Long term (current) use of antithrombotics/antiplatelets: Secondary | ICD-10-CM | POA: Diagnosis not present

## 2020-08-15 DIAGNOSIS — I2510 Atherosclerotic heart disease of native coronary artery without angina pectoris: Secondary | ICD-10-CM | POA: Diagnosis present

## 2020-08-15 DIAGNOSIS — Z8249 Family history of ischemic heart disease and other diseases of the circulatory system: Secondary | ICD-10-CM

## 2020-08-15 DIAGNOSIS — Z833 Family history of diabetes mellitus: Secondary | ICD-10-CM | POA: Diagnosis not present

## 2020-08-15 DIAGNOSIS — I214 Non-ST elevation (NSTEMI) myocardial infarction: Secondary | ICD-10-CM | POA: Diagnosis not present

## 2020-08-15 DIAGNOSIS — Z20822 Contact with and (suspected) exposure to covid-19: Secondary | ICD-10-CM | POA: Diagnosis present

## 2020-08-15 DIAGNOSIS — I25110 Atherosclerotic heart disease of native coronary artery with unstable angina pectoris: Secondary | ICD-10-CM | POA: Diagnosis present

## 2020-08-15 HISTORY — PX: LEFT HEART CATH AND CORONARY ANGIOGRAPHY: CATH118249

## 2020-08-15 LAB — URINALYSIS, ROUTINE W REFLEX MICROSCOPIC
Bilirubin Urine: NEGATIVE
Glucose, UA: NEGATIVE mg/dL
Hgb urine dipstick: NEGATIVE
Ketones, ur: NEGATIVE mg/dL
Leukocytes,Ua: NEGATIVE
Nitrite: NEGATIVE
Protein, ur: NEGATIVE mg/dL
Specific Gravity, Urine: 1.013 (ref 1.005–1.030)
pH: 6 (ref 5.0–8.0)

## 2020-08-15 LAB — PULMONARY FUNCTION TEST
FEF 25-75 Pre: 2.39 L/sec
FEF2575-%Pred-Pre: 94 %
FEV1-%Pred-Pre: 64 %
FEV1-Pre: 2.07 L
FEV1FVC-%Pred-Pre: 116 %
FEV6-%Pred-Pre: 58 %
FEV6-Pre: 2.4 L
FEV6FVC-%Pred-Pre: 105 %
FVC-%Pred-Pre: 54 %
FVC-Pre: 2.4 L
Pre FEV1/FVC ratio: 86 %
Pre FEV6/FVC Ratio: 100 %

## 2020-08-15 LAB — GLUCOSE, CAPILLARY
Glucose-Capillary: 131 mg/dL — ABNORMAL HIGH (ref 70–99)
Glucose-Capillary: 124 mg/dL — ABNORMAL HIGH (ref 70–99)
Glucose-Capillary: 172 mg/dL — ABNORMAL HIGH (ref 70–99)
Glucose-Capillary: 175 mg/dL — ABNORMAL HIGH (ref 70–99)

## 2020-08-15 LAB — SURGICAL PCR SCREEN
MRSA, PCR: NEGATIVE
Staphylococcus aureus: NEGATIVE

## 2020-08-15 SURGERY — LEFT HEART CATH AND CORONARY ANGIOGRAPHY
Anesthesia: LOCAL

## 2020-08-15 MED ORDER — SODIUM CHLORIDE 0.9 % WEIGHT BASED INFUSION
3.0000 mL/kg/h | INTRAVENOUS | Status: DC
  Administered 2020-08-15: 3 mL/kg/h via INTRAVENOUS

## 2020-08-15 MED ORDER — SODIUM CHLORIDE 0.9 % WEIGHT BASED INFUSION: 1.0000 mL/kg/h | INTRAVENOUS | Status: DC

## 2020-08-15 MED ORDER — HYDRALAZINE HCL 20 MG/ML IJ SOLN: 10.0000 mg | INTRAMUSCULAR | Status: AC | PRN

## 2020-08-15 MED ORDER — FENTANYL CITRATE (PF) 100 MCG/2ML IJ SOLN
INTRAMUSCULAR | Status: DC | PRN
  Administered 2020-08-15: 25 ug via INTRAVENOUS

## 2020-08-15 MED ORDER — SODIUM CHLORIDE 0.9% FLUSH: 3.0000 mL | Freq: Two times a day (BID) | INTRAVENOUS | Status: DC

## 2020-08-15 MED ORDER — HEPARIN SODIUM (PORCINE) 1000 UNIT/ML IJ SOLN
INTRAMUSCULAR | Status: AC
  Filled 2020-08-15: qty 1

## 2020-08-15 MED ORDER — HEPARIN SODIUM (PORCINE) 1000 UNIT/ML IJ SOLN
INTRAMUSCULAR | Status: DC | PRN
  Administered 2020-08-15: 6000 [IU] via INTRAVENOUS

## 2020-08-15 MED ORDER — ISOSORBIDE MONONITRATE ER 60 MG PO TB24
60.0000 mg | ORAL_TABLET | Freq: Every day | ORAL | Status: DC
  Administered 2020-08-16: 60 mg via ORAL
  Filled 2020-08-15: qty 1

## 2020-08-15 MED ORDER — ASPIRIN 81 MG PO CHEW: 81.0000 mg | CHEWABLE_TABLET | ORAL | Status: DC

## 2020-08-15 MED ORDER — SODIUM CHLORIDE 0.9 % IV SOLN: 250.0000 mL | INTRAVENOUS | Status: DC | PRN

## 2020-08-15 MED ORDER — LISINOPRIL-HYDROCHLOROTHIAZIDE 20-12.5 MG PO TABS: 1.0000 | ORAL_TABLET | Freq: Every day | ORAL | Status: DC

## 2020-08-15 MED ORDER — HYDROCHLOROTHIAZIDE 12.5 MG PO CAPS
12.5000 mg | ORAL_CAPSULE | Freq: Every day | ORAL | Status: DC
  Administered 2020-08-16: 12.5 mg via ORAL
  Filled 2020-08-15: qty 1

## 2020-08-15 MED ORDER — ATORVASTATIN CALCIUM 80 MG PO TABS
80.0000 mg | ORAL_TABLET | Freq: Every day | ORAL | Status: DC
  Administered 2020-08-15 – 2020-08-16 (×2): 80 mg via ORAL
  Filled 2020-08-15 (×2): qty 1

## 2020-08-15 MED ORDER — LISINOPRIL 20 MG PO TABS
20.0000 mg | ORAL_TABLET | Freq: Every day | ORAL | Status: DC
  Administered 2020-08-16: 20 mg via ORAL
  Filled 2020-08-15: qty 1

## 2020-08-15 MED ORDER — MIDAZOLAM HCL 2 MG/2ML IJ SOLN
INTRAMUSCULAR | Status: DC | PRN
  Administered 2020-08-15: 1 mg via INTRAVENOUS

## 2020-08-15 MED ORDER — SODIUM CHLORIDE 0.9 % IV SOLN: INTRAVENOUS | Status: AC

## 2020-08-15 MED ORDER — HEPARIN (PORCINE) IN NACL 1000-0.9 UT/500ML-% IV SOLN
INTRAVENOUS | Status: AC
  Filled 2020-08-15: qty 1000

## 2020-08-15 MED ORDER — SODIUM CHLORIDE 0.9% FLUSH
3.0000 mL | Freq: Two times a day (BID) | INTRAVENOUS | Status: DC
  Administered 2020-08-15 – 2020-08-16 (×2): 3 mL via INTRAVENOUS

## 2020-08-15 MED ORDER — LABETALOL HCL 5 MG/ML IV SOLN: 10.0000 mg | INTRAVENOUS | Status: AC | PRN

## 2020-08-15 MED ORDER — MORPHINE SULFATE (PF) 2 MG/ML IV SOLN: 2.0000 mg | INTRAVENOUS | Status: DC | PRN

## 2020-08-15 MED ORDER — FENTANYL CITRATE (PF) 100 MCG/2ML IJ SOLN
INTRAMUSCULAR | Status: AC
  Filled 2020-08-15: qty 2

## 2020-08-15 MED ORDER — IOHEXOL 350 MG/ML SOLN
INTRAVENOUS | Status: DC | PRN
Start: 2020-08-15 — End: 2020-08-15
  Administered 2020-08-15: 60 mL via INTRA_ARTERIAL

## 2020-08-15 MED ORDER — ACETAMINOPHEN 325 MG PO TABS: 650.0000 mg | ORAL_TABLET | ORAL | Status: DC | PRN

## 2020-08-15 MED ORDER — SODIUM CHLORIDE 0.9% FLUSH: 3.0000 mL | INTRAVENOUS | Status: DC | PRN

## 2020-08-15 MED ORDER — PANTOPRAZOLE SODIUM 40 MG PO TBEC: 40.0000 mg | DELAYED_RELEASE_TABLET | Freq: Every day | ORAL | Status: DC | PRN

## 2020-08-15 MED ORDER — VERAPAMIL HCL 2.5 MG/ML IV SOLN
INTRAVENOUS | Status: AC
  Filled 2020-08-15: qty 2

## 2020-08-15 MED ORDER — ASPIRIN EC 81 MG PO TBEC
81.0000 mg | DELAYED_RELEASE_TABLET | Freq: Every day | ORAL | Status: DC
  Administered 2020-08-16: 81 mg via ORAL
  Filled 2020-08-15: qty 1

## 2020-08-15 MED ORDER — LIDOCAINE HCL (PF) 1 % IJ SOLN
INTRAMUSCULAR | Status: AC
  Filled 2020-08-15: qty 30

## 2020-08-15 MED ORDER — NITROGLYCERIN 1 MG/10 ML FOR IR/CATH LAB
INTRA_ARTERIAL | Status: AC
  Filled 2020-08-15: qty 10

## 2020-08-15 MED ORDER — VERAPAMIL HCL 2.5 MG/ML IV SOLN
INTRA_ARTERIAL | Status: DC | PRN
  Administered 2020-08-15: 5 mL via INTRA_ARTERIAL

## 2020-08-15 MED ORDER — LIDOCAINE HCL (PF) 1 % IJ SOLN
INTRAMUSCULAR | Status: DC | PRN
  Administered 2020-08-15: 2 mL

## 2020-08-15 MED ORDER — MIDAZOLAM HCL 2 MG/2ML IJ SOLN
INTRAMUSCULAR | Status: AC
  Filled 2020-08-15: qty 2

## 2020-08-15 MED ORDER — ONDANSETRON HCL 4 MG/2ML IJ SOLN: 4.0000 mg | Freq: Four times a day (QID) | INTRAMUSCULAR | Status: DC | PRN

## 2020-08-15 MED ORDER — CARVEDILOL 6.25 MG PO TABS
18.7500 mg | ORAL_TABLET | Freq: Two times a day (BID) | ORAL | Status: DC
  Administered 2020-08-15 – 2020-08-16 (×2): 18.75 mg via ORAL
  Filled 2020-08-15 (×2): qty 1

## 2020-08-15 MED ORDER — NITROGLYCERIN 0.4 MG SL SUBL: 0.4000 mg | SUBLINGUAL_TABLET | SUBLINGUAL | Status: DC | PRN

## 2020-08-15 MED ORDER — HEPARIN (PORCINE) IN NACL 1000-0.9 UT/500ML-% IV SOLN
INTRAVENOUS | Status: DC | PRN
  Administered 2020-08-15 (×2): 500 mL

## 2020-08-15 SURGICAL SUPPLY — 11 items

## 2020-08-15 NOTE — Consult Note (Signed)
Reason for Consult: CAD Referring Physician: Dr. Calton Golds is an 65 y.o. male.  HPI: [name] is a 65 year old gentleman with known coronary disease who presents with a chief complaint of angina.  [name] [name] is a 65 year old man with a past medical history significant for coronary disease, MI, multiple percutaneous interventions, hypertension, mixed dyslipidemia, insulin-dependent type 2 diabetes, reflux, and anxiety.  His history of coronary disease dates back over 15 years.  He has had multiple percutaneous interventions.  At one point he had a V. fib arrest in 2012.  His current issue started in the fall 2021.  He was having progressive angina.  He had catheterization which showed severe multivessel coronary disease.  He was seen in consultation by Dr. Servando Snare.  After discussion with cardiology the plan was to attempt repeat coronary intervention.  He had an orbital atherectomy and drug-eluting stent placement in the ostial LAD.  In February 2022 he had relook catheterization which showed in-stent restenosis and he had another intervention.  He felt a little better for a while but then was complaining of fatigue and developed recurrent anginal symptoms.  His chest pain has become more frequent and more severe so he was admitted for repeat cardiac catheterization which again shows in-stent restenosis in the LAD and severe three-vessel disease overall.  He does have diffusely diseased vessels with some targets being small and poor quality.  He currently is pain-free.  He has been on prasugrel and received that this morning.  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  Past Medical History:  Diagnosis Date  . Anxiety   . Coronary atherosclerosis of native coronary artery    DES distal circumflex 2005; DES LAD/diagonal bifurcation 09/2010; DES ostial LAD and DES left PL 01/2020  . Essential hypertension   . GERD (gastroesophageal reflux disease)   .  History of kidney stones   . Mixed hyperlipidemia   . Myocardial infarction ([place])    Anterolateral with VF arrest 7/12  . Type 2 diabetes mellitus ([place])     Past Surgical History:  Procedure Laterality Date  . CARDIAC CATHETERIZATION  2012  . CAROTID STENT     stents x 2   . CHOLECYSTECTOMY N/A 12/04/2015   Procedure: LAPAROSCOPIC CHOLECYSTECTOMY;  Surgeon: Aviva Signs, MD;  Location: [facility];  Service: General;  Laterality: N/A;  . CHOLECYSTECTOMY, LAPAROSCOPIC  12/05/2015  . CORONARY ANGIOPLASTY  2012   STENT X 1 2012, STENT X 1 YRS BEFORE  . CORONARY ATHERECTOMY N/A 02/09/2020   Procedure: CORONARY ATHERECTOMY;  Surgeon: Wellington Hampshire, MD;  Location: [facility];  Service: Cardiovascular;  Laterality: N/A;  . CORONARY STENT INTERVENTION N/A 02/09/2020   Procedure: CORONARY STENT INTERVENTION;  Surgeon: Wellington Hampshire, MD;  Location: [facility];  Service: Cardiovascular;  Laterality: N/A;  . CORONARY STENT INTERVENTION N/A 05/22/2020   Procedure: CORONARY STENT INTERVENTION;  Surgeon: Jettie Booze, MD;  Location: [facility];  Service: Cardiovascular;  Laterality: N/A;  . HYDROCELE EXCISION Bilateral 06/02/2017   Procedure: HYDROCELECTOMY ADULT;  Surgeon: Ceasar Mons, MD;  Location: [facility];  Service: Urology;  Laterality: Bilateral;  ONLY NEEDS 45 MIN  . INGUINAL HERNIA REPAIR     RIGHT GROIN  . INTRAVASCULAR IMAGING/OCT N/A 05/22/2020   Procedure: INTRAVASCULAR IMAGING/OCT;  Surgeon: Jettie Booze, MD;  Location: [facility];  Service: Cardiovascular;  Laterality: N/A;  . INTRAVASCULAR ULTRASOUND/IVUS N/A 02/09/2020  Procedure: Intravascular Ultrasound/IVUS;  Surgeon: Wellington Hampshire, MD;  Location: [facility];  Service: Cardiovascular;  Laterality: N/A;  . KNEE ARTHROSCOPY Left 05/23/2019   Procedure: LEFT KNEE ARTHROSCOPY AND DEBRIDEMENT PARTIAL MEDIAL MENISECTOMY;  Surgeon: Newt Minion, MD;  Location: [facility];  Service: Orthopedics;  Laterality: Left;  . LEFT HEART CATH AND CORONARY ANGIOGRAPHY N/A 02/07/2020   Procedure: LEFT HEART CATH AND CORONARY ANGIOGRAPHY;  Surgeon: Leonie Man, MD;  Location: [facility];  Service: Cardiovascular;  Laterality: N/A;  . LEFT HEART CATH AND CORONARY ANGIOGRAPHY N/A 05/22/2020   Procedure: LEFT HEART CATH AND CORONARY ANGIOGRAPHY;  Surgeon: Jettie Booze, MD;  Location: [facility];  Service: Cardiovascular;  Laterality: N/A;  . LEFT HEART CATH AND CORONARY ANGIOGRAPHY N/A 08/15/2020   Procedure: LEFT HEART CATH AND CORONARY ANGIOGRAPHY;  Surgeon: Lorretta Harp, MD;  Location: [facility];  Service: Cardiovascular;  Laterality: N/A;  . TRIGGER FINGER RELEASE Right 01/08/2015   Procedure: RELEASE TRIGGER FINGER/A-1 PULLEY RIGHT MIDDLE FINGER;  Surgeon: Daryll Brod, MD;  Location: [facility];  Service: Orthopedics;  Laterality: Right;    Family History  Problem Relation Age of Onset  . Hyperlipidemia Mother   . Hypertension Mother   . Hyperlipidemia Father   . Hypertension Father   . Diabetes Father   . Dementia Father   . Diabetes Maternal Grandfather     Social History:  reports that he has never smoked. He has never used smokeless tobacco. He reports that he does not drink alcohol and does not use drugs.  Allergies: No Known Allergies  Medications:  Scheduled: . [START ON 08/16/2020] aspirin EC  81 mg Oral Daily  . atorvastatin  80 mg Oral Daily  . carvedilol  18.75 mg Oral BID  . [START ON 08/16/2020] lisinopril  20 mg Oral Daily   And  . [START ON 08/16/2020] hydrochlorothiazide  12.5 mg Oral Daily  . [START ON 08/16/2020] isosorbide mononitrate  60 mg Oral Daily  . sodium chloride flush  3 mL Intravenous Q12H    Results for orders placed or performed during the hospital encounter of 08/15/20 (from the past 48 hour(s))  Glucose, capillary     Status: Abnormal   Collection Time:  08/15/20  9:49 AM  Result Value Ref Range   Glucose-Capillary 131 (H) 70 - 99 mg/dL    Comment: Glucose reference range applies only to samples taken after fasting for at least 8 hours.  Glucose, capillary     Status: Abnormal   Collection Time: 08/15/20 11:27 AM  Result Value Ref Range   Glucose-Capillary 124 (H) 70 - 99 mg/dL    Comment: Glucose reference range applies only to samples taken after fasting for at least 8 hours.  Glucose, capillary     Status: Abnormal   Collection Time: 08/15/20  4:22 PM  Result Value Ref Range   Glucose-Capillary 172 (H) 70 - 99 mg/dL    Comment: Glucose reference range applies only to samples taken after fasting for at least 8 hours.    CARDIAC CATHETERIZATION  Result Date: 08/15/2020  Prox RCA lesion is 70% stenosed.  LPAV lesion is 100% stenosed.  Ost LAD to Prox LAD lesion is 95% stenosed.  Mid LAD lesion is 80% stenosed.  2nd Mrg lesion is 90% stenosed.  Previously placed 1st LPL stent (unknown type) is widely patent.  [name] [name] is a 65 y.o. male  [id number] LOCATION:  FACILITY: [facility] PHYSICIAN: Quay Burow, M.D. 11/29/1955 DATE OF PROCEDURE:  08/15/2020 DATE OF DISCHARGE: CARDIAC CATHETERIZATION History obtained from chart review.  [name] [name] is a 65 year old moderately overweight married Caucasian male patient of Dr. Inocente Salles McDowell's with a history of hypertension, hyperlipidemia and diabetes.  He had LAD atherectomy November 2021 by Dr. Sophronia Simas.  He has a left dominant system.  He underwent repeat catheterization by Dr. Irish Lack 05/22/2020 with aggressive in-stent restenosis in the proximal and mid LAD stents.  He underwent [place] angioplasty with OCT guidance with placement of an additional 3.5 x 12 mm long drug-eluting stent in the distal edge of the proximal small stent because of edge dissection.  He also has a 70% mid nondominant RCA and obtuse marginal branch disease with an occluded PDA.  He has had accelerated angina while exercising  in rehab and was referred as an outpatient for diagnostic coronary angiography. PROCEDURE DESCRIPTION: The patient was brought to the second floor [facility] lab in the postabsorptive state. He was premedicated with IV Versed and fentanyl. His right wristwas prepped and shaved in usual sterile fashion. Xylocaine 1% was used for local anesthesia. A 6 French sheath was inserted into the right radial artery using standard Seldinger technique. The patient received 6000 units  of heparin intravenously.  A 5 [place] TIG catheter and pigtail catheters were used for selective coronary angiography and obtain left heart pressures.  Isovue dye was used for the entirety of the case (60 cc administered to patient).  Retrograde aortic and left ventricular and pullback pressures were recorded.  Radial cocktail was administered via the SideArm sheath.   [name] has aggressive in-stent restenosis within the ostial/proximal LAD stent and mid LAD stent with obtuse marginal branch disease and an occluded PDA.  His last EF by hand-injection was normal.  He will need coronary artery bypass graft for complete revascularization giving aggressive early in-stent restenosis.  He is on Effient which was discontinued and he will need Effient washout prior to revascularization.  Given the accelerated nature of his symptoms and the degree of severity of his stenosis I favor hospitalization prior to revascularization.  The sheath was removed and a TR band was placed on the right wrist to achieve patent hemostasis.  The patient left lab in stable condition. Quay Burow. MD, [facility] 08/15/2020 9:50 AM   I personally reviewed the cardiac catheterization images and concur with the findings noted above.  Review of Systems Blood pressure 125/71, pulse 79, temperature 98.8 F (37.1 C), temperature source Oral, resp. rate 17, height 5\' 9"  (1.753 m), weight 110 kg, SpO2 93 %. Physical Exam Vitals reviewed.  Constitutional:       General: He is not in acute distress.    Appearance: Normal appearance.  HENT:     Head: Normocephalic and atraumatic.  Eyes:     General: No scleral icterus.    Extraocular Movements: Extraocular movements intact.  Neck:     Vascular: No carotid bruit.  Cardiovascular:     Rate and Rhythm: Normal rate and regular rhythm.     Heart sounds: Normal heart sounds. No murmur heard. No friction rub. No gallop.   Pulmonary:     Effort: Pulmonary effort is normal. No respiratory distress.     Breath sounds: Normal breath sounds. No wheezing or rales.  Abdominal:     General: There is no distension.     Palpations: Abdomen is soft.     Tenderness: There  is no abdominal tenderness.  Musculoskeletal:     Right lower leg: No edema.     Left lower leg: No edema.  Lymphadenopathy:     Cervical: No cervical adenopathy.  Skin:    General: Skin is warm and dry.  Neurological:     General: No focal deficit present.     Mental Status: He is alert and oriented to person, place, and time.     Cranial Nerves: No cranial nerve deficit.     Motor: No weakness.     Gait: Gait normal.    Assessment/Plan: [name] [name] is a 65 year old man significant for coronary disease, MI, multiple percutaneous interventions, hypertension, mixed dyslipidemia, insulin-dependent type 2 diabetes, reflux, and anxiety.  He has had a complex cardiac history with his first intervention in 2005.  He had a second admission in 0000000 complicated by V. fib arrest.  He has had multiple interventions over the past 6 months with rapid restenosis.  He has three-vessel disease with unstable symptoms.  Coronary bypass grafting is indicated for survival benefit and relief of symptoms.  He does have diffusely diseased vessels.  I discussed the general nature of the procedure, including the need for general anesthesia, the incisions to be used, the use of cardiopulmonary bypass, and the use of drainage tubes postoperatively with Mr. and  Mrs. Ysidro Evert. We discussed the expected hospital stay, overall recovery and short and long term outcomes.  I informed him of the indications, risks, benefits, and alternatives.  He understands the risks include, but are not limited to death, stroke, MI, DVT/PE, bleeding, possible need for transfusion, infections, cardiac arrhythmias, as well as other organ system dysfunction including respiratory, renal, or GI complications.  He is at higher risk for early or late graft failure due to the diffuse nature of his CAD.  He accepts the risks and agrees to proceed.  He has been on prasugrel.  We need to wait for that to "washout" prior to surgery.  The first available date at that point will be Wednesday, 08/21/2020  Melrose Nakayama 08/15/2020, 4:46 PM

## 2020-08-15 NOTE — H&P (View-Only) (Signed)
Reason for Consult: CAD Referring Physician: Dr. [name] is an 65 y.o. male.  HPI: [name] [name] is a 65 year old gentleman with known coronary disease who presents with a chief complaint of angina.  [name] [name] is a 65 year old man with a past medical history significant for coronary disease, MI, multiple percutaneous interventions, hypertension, mixed dyslipidemia, insulin-dependent type 2 diabetes, reflux, and anxiety.  His history of coronary disease dates back over 15 years.  He has had multiple percutaneous interventions.  At one point he had a V. fib arrest in 2012.  His current issue started in the fall 2021.  He was having progressive angina.  He had catheterization which showed severe multivessel coronary disease.  He was seen in consultation by Dr. Servando [name].  After discussion with cardiology the plan was to attempt repeat coronary intervention.  He had an orbital atherectomy and drug-eluting stent placement in the ostial LAD.  In February 2022 he had relook catheterization which showed in-stent restenosis and he had another intervention.  He felt a little better for a while but then was complaining of fatigue and developed recurrent anginal symptoms.  His chest pain has become more frequent and more severe so he was admitted for repeat cardiac catheterization which again shows in-stent restenosis in the LAD and severe three-vessel disease overall.  He does have diffusely diseased vessels with some targets being small and poor quality.  He currently is pain-free.  He has been on prasugrel and received that this morning.  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  Past Medical History:  Diagnosis Date  . Anxiety   . Coronary atherosclerosis of native coronary artery    DES distal circumflex 2005; DES LAD/diagonal bifurcation 09/2010; DES ostial LAD and DES left PL 01/2020  . Essential hypertension   . GERD (gastroesophageal reflux disease)   .  History of kidney stones   . Mixed hyperlipidemia   . Myocardial infarction ([place])    Anterolateral with VF arrest 7/12  . Type 2 diabetes mellitus ([place])     Past Surgical History:  Procedure Laterality Date  . CARDIAC CATHETERIZATION  2012  . CAROTID STENT     stents x 2   . CHOLECYSTECTOMY N/A 12/04/2015   Procedure: LAPAROSCOPIC CHOLECYSTECTOMY;  Surgeon: Aviva Signs, MD;  Location: [facility];  Service: General;  Laterality: N/A;  . CHOLECYSTECTOMY, LAPAROSCOPIC  12/05/2015  . CORONARY ANGIOPLASTY  2012   STENT X 1 2012, STENT X 1 YRS BEFORE  . CORONARY ATHERECTOMY N/A 02/09/2020   Procedure: CORONARY ATHERECTOMY;  Surgeon: Wellington Hampshire, MD;  Location: [facility];  Service: Cardiovascular;  Laterality: N/A;  . CORONARY STENT INTERVENTION N/A 02/09/2020   Procedure: CORONARY STENT INTERVENTION;  Surgeon: Wellington Hampshire, MD;  Location: [facility];  Service: Cardiovascular;  Laterality: N/A;  . CORONARY STENT INTERVENTION N/A 05/22/2020   Procedure: CORONARY STENT INTERVENTION;  Surgeon: Jettie Booze, MD;  Location: [facility];  Service: Cardiovascular;  Laterality: N/A;  . HYDROCELE EXCISION Bilateral 06/02/2017   Procedure: HYDROCELECTOMY ADULT;  Surgeon: Ceasar Mons, MD;  Location: [facility];  Service: Urology;  Laterality: Bilateral;  ONLY NEEDS 45 MIN  . INGUINAL HERNIA REPAIR     RIGHT GROIN  . INTRAVASCULAR IMAGING/OCT N/A 05/22/2020   Procedure: INTRAVASCULAR IMAGING/OCT;  Surgeon: Jettie Booze, MD;  Location: [facility];  Service: Cardiovascular;  Laterality: N/A;  . INTRAVASCULAR ULTRASOUND/IVUS N/A 02/09/2020  Procedure: Intravascular Ultrasound/IVUS;  Surgeon: Wellington Hampshire, MD;  Location: [facility];  Service: Cardiovascular;  Laterality: N/A;  . KNEE ARTHROSCOPY Left 05/23/2019   Procedure: LEFT KNEE ARTHROSCOPY AND DEBRIDEMENT PARTIAL MEDIAL MENISECTOMY;  Surgeon: Newt Minion, MD;  Location: [facility];  Service: Orthopedics;  Laterality: Left;  . LEFT HEART CATH AND CORONARY ANGIOGRAPHY N/A 02/07/2020   Procedure: LEFT HEART CATH AND CORONARY ANGIOGRAPHY;  Surgeon: Leonie Man, MD;  Location: [facility];  Service: Cardiovascular;  Laterality: N/A;  . LEFT HEART CATH AND CORONARY ANGIOGRAPHY N/A 05/22/2020   Procedure: LEFT HEART CATH AND CORONARY ANGIOGRAPHY;  Surgeon: Jettie Booze, MD;  Location: [facility];  Service: Cardiovascular;  Laterality: N/A;  . LEFT HEART CATH AND CORONARY ANGIOGRAPHY N/A 08/15/2020   Procedure: LEFT HEART CATH AND CORONARY ANGIOGRAPHY;  Surgeon: Lorretta Harp, MD;  Location: [facility];  Service: Cardiovascular;  Laterality: N/A;  . TRIGGER FINGER RELEASE Right 01/08/2015   Procedure: RELEASE TRIGGER FINGER/A-1 PULLEY RIGHT MIDDLE FINGER;  Surgeon: Daryll Brod, MD;  Location: [facility];  Service: Orthopedics;  Laterality: Right;    Family History  Problem Relation Age of Onset  . Hyperlipidemia Mother   . Hypertension Mother   . Hyperlipidemia Father   . Hypertension Father   . Diabetes Father   . Dementia Father   . Diabetes Maternal Grandfather     Social History:  reports that he has never smoked. He has never used smokeless tobacco. He reports that he does not drink alcohol and does not use drugs.  Allergies: No Known Allergies  Medications:  Scheduled: . [START ON 08/16/2020] aspirin EC  81 mg Oral Daily  . atorvastatin  80 mg Oral Daily  . carvedilol  18.75 mg Oral BID  . [START ON 08/16/2020] lisinopril  20 mg Oral Daily   And  . [START ON 08/16/2020] hydrochlorothiazide  12.5 mg Oral Daily  . [START ON 08/16/2020] isosorbide mononitrate  60 mg Oral Daily  . sodium chloride flush  3 mL Intravenous Q12H    Results for orders placed or performed during the hospital encounter of 08/15/20 (from the past 48 hour(s))  Glucose, capillary     Status: Abnormal   Collection Time:  08/15/20  9:49 AM  Result Value Ref Range   Glucose-Capillary 131 (H) 70 - 99 mg/dL    Comment: Glucose reference range applies only to samples taken after fasting for at least 8 hours.  Glucose, capillary     Status: Abnormal   Collection Time: 08/15/20 11:27 AM  Result Value Ref Range   Glucose-Capillary 124 (H) 70 - 99 mg/dL    Comment: Glucose reference range applies only to samples taken after fasting for at least 8 hours.  Glucose, capillary     Status: Abnormal   Collection Time: 08/15/20  4:22 PM  Result Value Ref Range   Glucose-Capillary 172 (H) 70 - 99 mg/dL    Comment: Glucose reference range applies only to samples taken after fasting for at least 8 hours.    CARDIAC CATHETERIZATION  Result Date: 08/15/2020  Prox RCA lesion is 70% stenosed.  LPAV lesion is 100% stenosed.  Ost LAD to Prox LAD lesion is 95% stenosed.  Mid LAD lesion is 80% stenosed.  2nd Mrg lesion is 90% stenosed.  Previously placed 1st LPL stent (unknown type) is widely patent.  [name] [name] is a 65 y.o. male  [id number] LOCATION:  FACILITY: [facility] PHYSICIAN: [name] [name], M.D. 11/29/1955 DATE OF PROCEDURE:  08/15/2020 DATE OF DISCHARGE: CARDIAC CATHETERIZATION History obtained from chart review.  [name] [name] is a 65 year old moderately overweight married Caucasian male patient of Dr. Inocente Salles McDowell's with a history of hypertension, hyperlipidemia and diabetes.  He had LAD atherectomy November 2021 by Dr. Sophronia Simas.  He has a left dominant system.  He underwent repeat catheterization by Dr. Irish Lack 05/22/2020 with aggressive in-stent restenosis in the proximal and mid LAD stents.  He underwent [place] Ball[place] angioplasty with OCT guidance with placement of an additional 3.5 x 12 mm long drug-eluting stent in the distal edge of the proximal small stent because of edge dissection.  He also has a 70% mid nondominant RCA and obtuse marginal branch disease with an occluded PDA.  He has had accelerated angina while exercising  in rehab and was referred as an outpatient for diagnostic coronary angiography. PROCEDURE DESCRIPTION: The patient was brought to the second floor [facility] cath lab in the postabsorptive state. He was premedicated with IV Versed and fentanyl. His right wristwas prepped and shaved in usual sterile fashion. Xylocaine 1% was used for local anesthesia. A 6 French sheath was inserted into the right radial artery using standard Seldinger technique. The patient received 6000 units  of heparin intravenously.  A 5 [place] TIG catheter and pigtail catheters were used for selective coronary angiography and obtain left heart pressures.  Isovue dye was used for the entirety of the case (60 cc administered to patient).  Retrograde aortic and left ventricular and pullback pressures were recorded.  Radial cocktail was administered via the SideArm sheath.   [name] [name] has aggressive in-stent restenosis within the ostial/proximal LAD stent and mid LAD stent with obtuse marginal branch disease and an occluded PDA.  His last EF by hand-injection was normal.  He will need coronary artery bypass graft for complete revascularization giving aggressive early in-stent restenosis.  He is on Effient which was discontinued and he will need Effient washout prior to revascularization.  Given the accelerated nature of his symptoms and the degree of severity of his stenosis I favor hospitalization prior to revascularization.  The sheath was removed and a TR band was placed on the right wrist to achieve patent hemostasis.  The patient left lab in stable condition. [name] [name]. MD, [facility] 08/15/2020 9:50 AM   I personally reviewed the cardiac catheterization images and concur with the findings noted above.  Review of Systems Blood pressure 125/71, pulse 79, temperature 98.8 F (37.1 C), temperature source Oral, resp. rate 17, height 5\' 9"  (1.753 m), weight 110 kg, SpO2 93 %. Physical Exam Vitals reviewed.  Constitutional:       General: He is not in acute distress.    Appearance: Normal appearance.  HENT:     Head: Normocephalic and atraumatic.  Eyes:     General: No scleral icterus.    Extraocular Movements: Extraocular movements intact.  Neck:     Vascular: No carotid bruit.  Cardiovascular:     Rate and Rhythm: Normal rate and regular rhythm.     Heart sounds: Normal heart sounds. No murmur heard. No friction rub. No gallop.   Pulmonary:     Effort: Pulmonary effort is normal. No respiratory distress.     Breath sounds: Normal breath sounds. No wheezing or rales.  Abdominal:     General: There is no distension.     Palpations: Abdomen is soft.     Tenderness: There  is no abdominal tenderness.  Musculoskeletal:     Right lower leg: No edema.     Left lower leg: No edema.  Lymphadenopathy:     Cervical: No cervical adenopathy.  Skin:    General: Skin is warm and dry.  Neurological:     General: No focal deficit present.     Mental Status: He is alert and oriented to person, place, and time.     Cranial Nerves: No cranial nerve deficit.     Motor: No weakness.     Gait: Gait normal.    Assessment/Plan: [name] [name] is a 65 year old man significant for coronary disease, MI, multiple percutaneous interventions, hypertension, mixed dyslipidemia, insulin-dependent type 2 diabetes, reflux, and anxiety.  He has had a complex cardiac history with his first intervention in 2005.  He had a second admission in 0000000 complicated by V. fib arrest.  He has had multiple interventions over the past 6 months with rapid restenosis.  He has three-vessel disease with unstable symptoms.  Coronary bypass grafting is indicated for survival benefit and relief of symptoms.  He does have diffusely diseased vessels.  I discussed the general nature of the procedure, including the need for general anesthesia, the incisions to be used, the use of cardiopulmonary bypass, and the use of drainage tubes postoperatively with Mr. and  Mrs. [name] [name]. We discussed the expected hospital stay, overall recovery and short and long term outcomes.  I informed him of the indications, risks, benefits, and alternatives.  He understands the risks include, but are not limited to death, stroke, MI, DVT/PE, bleeding, possible need for transfusion, infections, cardiac arrhythmias, as well as other organ system dysfunction including respiratory, renal, or GI complications.  He is at higher risk for early or late graft failure due to the diffuse nature of his CAD.  He accepts the risks and agrees to proceed.  He has been on prasugrel.  We need to wait for that to "washout" prior to surgery.  The first available date at that point will be Wednesday, 08/21/2020  Melrose Nakayama 08/15/2020, 4:46 PM

## 2020-08-15 NOTE — Progress Notes (Signed)
[facility] consulted for CABG evaluation. °

## 2020-08-15 NOTE — Interval H&P Note (Signed)
Cath Lab Visit (complete for each Cath Lab visit)  Clinical Evaluation Leading to the Procedure:   ACS: No.  Non-ACS:    Anginal Classification: CCS II  Anti-ischemic medical therapy: Maximal Therapy (2 or more classes of medications)  Non-Invasive Test Results: No non-invasive testing performed  Prior CABG: No previous CABG      History and Physical Interval Note:  08/15/2020 9:07 AM  [name] [name]  has presented today for surgery, with the diagnosis of chest pain.  The various methods of treatment have been discussed with the patient and family. After consideration of risks, benefits and other options for treatment, the patient has consented to  Procedure(s): LEFT HEART CATH AND CORONARY ANGIOGRAPHY (N/A) as a surgical intervention.  The patient's history has been reviewed, patient examined, no change in status, stable for surgery.  I have reviewed the patient's chart and labs.  Questions were answered to the patient's satisfaction.     Quay Burow

## 2020-08-16 ENCOUNTER — Other Ambulatory Visit: Payer: Self-pay | Admitting: *Deleted

## 2020-08-16 ENCOUNTER — Inpatient Hospital Stay (HOSPITAL_COMMUNITY): Payer: Medicare PPO

## 2020-08-16 ENCOUNTER — Encounter (HOSPITAL_COMMUNITY): Payer: Medicare PPO

## 2020-08-16 DIAGNOSIS — I200 Unstable angina: Secondary | ICD-10-CM

## 2020-08-16 DIAGNOSIS — I2510 Atherosclerotic heart disease of native coronary artery without angina pectoris: Secondary | ICD-10-CM

## 2020-08-16 DIAGNOSIS — Z01810 Encounter for preprocedural cardiovascular examination: Secondary | ICD-10-CM

## 2020-08-16 LAB — LIPID PANEL
Cholesterol: 77 mg/dL (ref 0–200)
HDL: 21 mg/dL — ABNORMAL LOW (ref >40)
LDL Cholesterol: 30 mg/dL (ref 0–99)
Total CHOL/HDL Ratio: 3.7 RATIO
Triglycerides: 128 mg/dL (ref <150)
VLDL: 26 mg/dL (ref 0–40)

## 2020-08-16 LAB — BASIC METABOLIC PANEL
Anion gap: 9 (ref 5–15)
BUN: 15 mg/dL (ref 8–23)
CO2: 26 mmol/L (ref 22–32)
Calcium: 9.1 mg/dL (ref 8.9–10.3)
Chloride: 100 mmol/L (ref 98–111)
Creatinine, Ser: 1 mg/dL (ref 0.61–1.24)
GFR, Estimated: >60 mL/min (ref >60)
Glucose, Bld: 182 mg/dL — ABNORMAL HIGH (ref 70–99)
Potassium: 4.1 mmol/L (ref 3.5–5.1)
Sodium: 135 mmol/L (ref 135–145)

## 2020-08-16 LAB — ECHOCARDIOGRAM COMPLETE
Area-P 1/2: 3.77 cm2
Height: 69 in
S' Lateral: 3.3 cm
Weight: 3878.4 oz

## 2020-08-16 LAB — CBC
HCT: 42.1 % (ref 39.0–52.0)
Hemoglobin: 13.7 g/dL (ref 13.0–17.0)
MCH: 28.4 pg (ref 26.0–34.0)
MCHC: 32.5 g/dL (ref 30.0–36.0)
MCV: 87.2 fL (ref 80.0–100.0)
Platelets: 228 10*3/uL (ref 150–400)
RBC: 4.83 MIL/uL (ref 4.22–5.81)
RDW: 14.7 % (ref 11.5–15.5)
WBC: 6.6 10*3/uL (ref 4.0–10.5)
nRBC: 0 % (ref 0.0–0.2)

## 2020-08-16 LAB — HEMOGLOBIN A1C
Hgb A1c MFr Bld: 8.2 % — ABNORMAL HIGH (ref 4.8–5.6)
Mean Plasma Glucose: 188.64 mg/dL

## 2020-08-16 LAB — GLUCOSE, CAPILLARY
Glucose-Capillary: 289 mg/dL — ABNORMAL HIGH (ref 70–99)
Glucose-Capillary: 265 mg/dL — ABNORMAL HIGH (ref 70–99)

## 2020-08-16 MED ORDER — INSULIN ASPART 100 UNIT/ML IJ SOLN
0.0000 [IU] | Freq: Three times a day (TID) | INTRAMUSCULAR | Status: DC
  Administered 2020-08-16 (×2): 8 [IU] via SUBCUTANEOUS

## 2020-08-16 MED ORDER — INSULIN GLARGINE 100 UNIT/ML ~~LOC~~ SOLN
44.0000 [IU] | Freq: Every day | SUBCUTANEOUS | Status: DC
  Administered 2020-08-16: 44 [IU] via SUBCUTANEOUS
  Filled 2020-08-16: qty 0.44

## 2020-08-16 MED ORDER — INSULIN GLARGINE 100 UNIT/ML ~~LOC~~ SOLN
75.0000 [IU] | Freq: Every day | SUBCUTANEOUS | Status: DC
  Filled 2020-08-16: qty 0.75

## 2020-08-16 MED ORDER — TAMSULOSIN HCL 0.4 MG PO CAPS: 0.4000 mg | ORAL_CAPSULE | Freq: Every day | ORAL | Status: DC

## 2020-08-16 NOTE — Progress Notes (Signed)
  Echocardiogram 2D Echocardiogram has been performed.  [name] [name] 08/16/2020, 11:20 AM

## 2020-08-16 NOTE — Progress Notes (Signed)
Discharge Progress Report  Patient Details  Name: [name] [name] MRN: [id number] Date of Birth: 30-Jun-1955 Referring Provider:   Flowsheet Row CARDIAC REHAB PHASE II ORIENTATION from 06/17/2020 in [place]  Referring Provider Domenic Polite       Number of Visits: 22  Reason for Discharge:  Early Exit:  Discharged for a CABG procedure  Smoking History:  Social History   Tobacco Use  Smoking Status Never Smoker  Smokeless Tobacco Never Used    Diagnosis:  Status post angioplasty with stent  History of atherectomy  S/P drug eluting coronary stent placement  ADL [facility]:   Initial Exercise Prescription:  Initial Exercise Prescription - 06/17/20 1400      Date of Initial Exercise RX and Referring Provider   Date 06/17/20    Referring Provider Domenic Polite    Expected Discharge Date 09/06/20      Treadmill   MPH 1.5    Grade 0    Minutes 17      NuStep   Level 2    SPM 80    Minutes 22      Prescription Details   Frequency (times per week) 3    Duration Progress to 30 minutes of continuous aerobic without signs/symptoms of physical distress      Intensity   THRR 40-80% of Max Heartrate 62-123    Ratings of Perceived Exertion 11-13      Resistance Training   Training Prescription Yes    Weight 4 lbs    Reps 10-15           Discharge Exercise Prescription (Final Exercise Prescription Changes):  Exercise Prescription Changes - 08/05/20 1030      Response to Exercise   Blood Pressure (Admit) 130/58    Blood Pressure (Exercise) 148/68    Blood Pressure (Exit) 108/64    Heart Rate (Admit) 81 bpm    Heart Rate (Exercise) 105 bpm    Heart Rate (Exit) 90 bpm    Rating of Perceived Exertion (Exercise) 12    Duration Continue with 30 min of aerobic exercise without signs/symptoms of physical distress.    Intensity THRR unchanged      Progression   Progression Continue to progress workloads to maintain intensity without signs/symptoms of  physical distress.      Resistance Training   Training Prescription Yes    Weight 5 lbs    Reps 10-15    Time 10 Minutes      Treadmill   MPH 2    Grade 0    Minutes 17    METs 2.53      NuStep   Level 3    SPM 95    Minutes 22    METs 2.17           Functional Capacity:  6 Minute Walk    Row Name 06/17/20 1404         6 Minute Walk   Phase Initial     Distance 1175 feet     Walk Time 6 minutes     # of Rest Breaks 0     MPH 2.2     METS 2.54     RPE 12     VO2 Peak 8.9     Symptoms Yes (comment)     Comments chest discomfort 4/10. He says that he feels like it is in his lungs/airway rather than his heart. Does not feel similar to any pain he had with cardiac event  Resting HR 84 bpm     Resting BP 112/60     Resting Oxygen Saturation  94 %     Exercise Oxygen Saturation  during 6 min walk 97 %     Max Ex. HR 100 bpm     Max Ex. BP 146/70     2 Minute Post BP 130/70            Psychological, QOL, Others - Outcomes: PHQ 2/9: Depression screen [facility] 2/9 08/05/2020 07/22/2020 07/22/2020 06/17/2020 05/06/2020  Decreased Interest 0 0 0 0 0  Down, Depressed, Hopeless 0 0 0 1 0  PHQ - 2 Score 0 0 0 1 0  Altered sleeping - - - 3 -  Tired, decreased energy - - - 2 -  Change in appetite - - - 1 -  Feeling bad or failure about yourself  - - - 1 -  Trouble concentrating - - - 1 -  Moving slowly or fidgety/restless - - - 0 -  Suicidal thoughts - - - 0 -  PHQ-9 Score - - - 9 -  Difficult doing work/chores - - - Somewhat difficult -  Some recent data might be hidden    Quality of Life:  Quality of Life - 06/17/20 1414      Quality of Life   Select Quality of Life      Quality of Life Scores   Health/Function Pre 11.47 %    Socioeconomic Pre 20.29 %    Psych/Spiritual Pre 14.79 %    Family Pre 25.2 %    GLOBAL Pre 15.99 %           Personal Goals: Goals established at orientation with interventions provided to work toward goal.  Personal Goals and  Risk Factors at Admission - 06/17/20 1304      Core Components/Risk Factors/Patient Goals on Admission    Weight Management Yes    Intervention Weight Management: Develop a combined nutrition and exercise program designed to reach desired caloric intake, while maintaining appropriate intake of nutrient and fiber, sodium and fats, and appropriate energy expenditure required for the weight goal.;Weight Management/Obesity: Establish reasonable short term and long term weight goals.;Weight Management: Provide education and appropriate resources to help participant work on and attain dietary goals.;Obesity: Provide education and appropriate resources to help participant work on and attain dietary goals.    Expected Outcomes Short Term: Continue to assess and modify interventions until short term weight is achieved;Long Term: Adherence to nutrition and physical activity/exercise program aimed toward attainment of established weight goal;Weight Loss: Understanding of general recommendations for a balanced deficit meal plan, which promotes 1-2 lb weight loss per week and includes a negative energy balance of [phone] kcal/d;Understanding recommendations for meals to include 15-35% energy as protein, 25-35% energy from fat, 35-60% energy from carbohydrates, less than 200mg  of dietary cholesterol, 20-35 gm of total fiber daily;Understanding of distribution of calorie intake throughout the day with the consumption of 4-5 meals/snacks    Improve shortness of breath with ADL's Yes    Intervention Provide education, individualized exercise plan and daily activity instruction to help decrease symptoms of SOB with activities of daily living.    Expected Outcomes Short Term: Improve cardiorespiratory fitness to achieve a reduction of symptoms when performing ADLs;Long Term: Be able to perform more ADLs without symptoms or delay the onset of symptoms    Diabetes Yes    Intervention Provide education about signs/symptoms and  action to take for hypo/hyperglycemia.;Provide education about  proper nutrition, including hydration, and aerobic/resistive exercise prescription along with prescribed medications to achieve blood glucose in normal ranges: Fasting glucose 65-99 mg/dL    Expected Outcomes Short Term: Participant verbalizes understanding of the signs/symptoms and immediate care of hyper/hypoglycemia, proper foot care and importance of medication, aerobic/resistive exercise and nutrition plan for blood glucose control.;Long Term: Attainment of HbA1C < 7%.    Stress Yes    Intervention Offer individual and/or small group education and counseling on adjustment to heart disease, stress management and health-related lifestyle change. Teach and support self-help strategies.;Refer participants experiencing significant psychosocial distress to appropriate mental health specialists for further evaluation and treatment. When possible, include family members and significant others in education/counseling sessions.    Expected Outcomes Short Term: Participant demonstrates changes in health-related behavior, relaxation and other stress management skills, ability to obtain effective social support, and compliance with psychotropic medications if prescribed.;Long Term: Emotional wellbeing is indicated by absence of clinically significant psychosocial distress or social isolation.            Personal Goals Discharge:  Goals and Risk Factor Review    Row Name 06/26/20 0825 07/23/20 [id number]           Core Components/Risk Factors/Patient Goals Review   Personal Goals Review Weight Management/Obesity;Other;Diabetes Weight Management/Obesity;Other;Diabetes      Review Patient is new to the program completing 4 sessions. He was referred back to [facility] with DES. He has multiple risk factors for CAD and is participating in the program for risk modification. His personal goals for the program are to lose weight and decrease his SOB. We will  continue to monitor his progress as he works toward meeting these goals. Patient has completed 15 sessions losing 1 lb since last 30 day review. He is doing well in the program with consistent attendance and progressions. His most recent A1C on file was 02/07/20 at 6.9%. His blood pressures are well controlled. He has had one episode of chest pain while walking on the treadmill. Pain subsisded immediately after he stopped. He says he has had a few episodes with activity. He has NTG but has not had to take any. The pain always subsides if he stops the activity. His cardiologist is aware of his chest pain. His personal goals for the program are to lose weight; and decrease his SOB overall. We will continue to monitor his progress as he works towards meeting these goals.      Expected Outcomes Patient will complete the program meeting both personal and program goals. Patient will complete the program meeting both personal and program goals.             Exercise Goals and Review:  Exercise Goals    Row Name 06/17/20 1412 07/01/20 1224 07/29/20 1340         Exercise Goals   Increase Physical Activity Yes Yes Yes     Intervention Provide advice, education, support and counseling about physical activity/exercise needs.;Develop an individualized exercise prescription for aerobic and resistive training based on initial evaluation findings, risk stratification, comorbidities and participant's personal goals. Provide advice, education, support and counseling about physical activity/exercise needs.;Develop an individualized exercise prescription for aerobic and resistive training based on initial evaluation findings, risk stratification, comorbidities and participant's personal goals. Provide advice, education, support and counseling about physical activity/exercise needs.;Develop an individualized exercise prescription for aerobic and resistive training based on initial evaluation findings, risk stratification,  comorbidities and participant's personal goals.     Expected Outcomes Short Term:  Attend rehab on a regular basis to increase amount of physical activity.;Long Term: Add in home exercise to make exercise part of routine and to increase amount of physical activity.;Long Term: Exercising regularly at least 3-5 days a week. Short Term: Attend rehab on a regular basis to increase amount of physical activity.;Long Term: Add in home exercise to make exercise part of routine and to increase amount of physical activity.;Long Term: Exercising regularly at least 3-5 days a week. Short Term: Attend rehab on a regular basis to increase amount of physical activity.;Long Term: Add in home exercise to make exercise part of routine and to increase amount of physical activity.;Long Term: Exercising regularly at least 3-5 days a week.     Increase Strength and Stamina Yes Yes Yes     Intervention Provide advice, education, support and counseling about physical activity/exercise needs.;Develop an individualized exercise prescription for aerobic and resistive training based on initial evaluation findings, risk stratification, comorbidities and participant's personal goals. Provide advice, education, support and counseling about physical activity/exercise needs.;Develop an individualized exercise prescription for aerobic and resistive training based on initial evaluation findings, risk stratification, comorbidities and participant's personal goals. Provide advice, education, support and counseling about physical activity/exercise needs.;Develop an individualized exercise prescription for aerobic and resistive training based on initial evaluation findings, risk stratification, comorbidities and participant's personal goals.     Expected Outcomes Short Term: Perform resistance training exercises routinely during rehab and add in resistance training at home;Short Term: Increase workloads from initial exercise prescription for  resistance, speed, and METs.;Long Term: Improve cardiorespiratory fitness, muscular endurance and strength as measured by increased METs and functional capacity (6MWT) Short Term: Perform resistance training exercises routinely during rehab and add in resistance training at home;Short Term: Increase workloads from initial exercise prescription for resistance, speed, and METs.;Long Term: Improve cardiorespiratory fitness, muscular endurance and strength as measured by increased METs and functional capacity (6MWT) Short Term: Perform resistance training exercises routinely during rehab and add in resistance training at home;Short Term: Increase workloads from initial exercise prescription for resistance, speed, and METs.;Long Term: Improve cardiorespiratory fitness, muscular endurance and strength as measured by increased METs and functional capacity (6MWT)     Able to understand and use rate of perceived exertion (RPE) scale Yes Yes Yes     Intervention Provide education and explanation on how to use RPE scale Provide education and explanation on how to use RPE scale Provide education and explanation on how to use RPE scale     Expected Outcomes Short Term: Able to use RPE daily in rehab to express subjective intensity level;Long Term:  Able to use RPE to guide intensity level when exercising independently Short Term: Able to use RPE daily in rehab to express subjective intensity level;Long Term:  Able to use RPE to guide intensity level when exercising independently Short Term: Able to use RPE daily in rehab to express subjective intensity level;Long Term:  Able to use RPE to guide intensity level when exercising independently     Knowledge and understanding of Target Heart Rate Range (THRR) Yes -- Yes     Intervention Provide education and explanation of THRR including how the numbers were predicted and where they are located for reference Provide education and explanation of THRR including how the numbers  were predicted and where they are located for reference Provide education and explanation of THRR including how the numbers were predicted and where they are located for reference     Expected Outcomes Short Term: Able  to use daily as guideline for intensity in rehab;Long Term: Able to use THRR to govern intensity when exercising independently;Short Term: Able to state/look up THRR Short Term: Able to use daily as guideline for intensity in rehab;Long Term: Able to use THRR to govern intensity when exercising independently;Short Term: Able to state/look up THRR Short Term: Able to use daily as guideline for intensity in rehab;Long Term: Able to use THRR to govern intensity when exercising independently;Short Term: Able to state/look up THRR     Able to check pulse independently Yes Yes Yes     Intervention Provide education and demonstration on how to check pulse in carotid and radial arteries.;Review the importance of being able to check your own pulse for safety during independent exercise Provide education and demonstration on how to check pulse in carotid and radial arteries.;Review the importance of being able to check your own pulse for safety during independent exercise Provide education and demonstration on how to check pulse in carotid and radial arteries.;Review the importance of being able to check your own pulse for safety during independent exercise     Expected Outcomes Short Term: Able to explain why pulse checking is important during independent exercise;Long Term: Able to check pulse independently and accurately Short Term: Able to explain why pulse checking is important during independent exercise;Long Term: Able to check pulse independently and accurately Short Term: Able to explain why pulse checking is important during independent exercise;Long Term: Able to check pulse independently and accurately     Understanding of Exercise Prescription Yes Yes Yes     Intervention Provide education,  explanation, and written materials on patient's individual exercise prescription Provide education, explanation, and written materials on patient's individual exercise prescription Provide education, explanation, and written materials on patient's individual exercise prescription     Expected Outcomes Short Term: Able to explain program exercise prescription;Long Term: Able to explain home exercise prescription to exercise independently Short Term: Able to explain program exercise prescription;Long Term: Able to explain home exercise prescription to exercise independently Short Term: Able to explain program exercise prescription;Long Term: Able to explain home exercise prescription to exercise independently            Exercise Goals Re-Evaluation:  Exercise Goals Re-Evaluation    Row Name 07/01/20 1224 07/29/20 1340           Exercise Goal Re-Evaluation   Exercise Goals Review Increase Physical Activity;Increase Strength and Stamina;Able to understand and use rate of perceived exertion (RPE) scale;Knowledge and understanding of Target Heart Rate Range (THRR);Able to check pulse independently;Understanding of Exercise Prescription Increase Physical Activity;Increase Strength and Stamina;Able to understand and use rate of perceived exertion (RPE) scale;Knowledge and understanding of Target Heart Rate Range (THRR);Able to check pulse independently;Understanding of Exercise Prescription      Comments Patient has completed 5 exercise sessions. He has been tolerating exercise well since his return. We have been gradually trying to increase the intensities on the treadmill hoping for no angina with increased intensities, since he experienced angina last time he was in cardiac rehab on the treadmill at an increased speed. So far no symptoms. He expressed apprehension with the treadmill because of what occurred last time. Overal lhe maintains a positive attitude. He is currently exercising at 2.3 METs on the  treadmill. Will continue to monitor and progress as able. Patient has completed 17 exercise sessions. He has tolerated exercise very well. We are still gradually increasing intensities on the treadmill still with the expectation of no  angina. He has not experienced symptoms so far. He has gotten more comfortable with the treadmill. He has increased his strength training intensities to 5 lbs. He always has a positive attitude and is eager to come to rehab. He is currently exercisinga t 2.45 METs on the treadmill. Will continue to monitor and progress as able.      Expected Outcomes Through exercise at rehab and by engaging in a home exercise program the patient will reach their goals. Through exercise at rehab and by engaging in a home exercise program the patient will reach their goals.             Nutrition & Weight - Outcomes:  Pre Biometrics - 08/05/20 1030      Pre Biometrics   Weight 109.3 kg    BMI (Calculated) 35.57            Nutrition:  Nutrition Therapy & Goals - 06/26/20 0823      Personal Nutrition Goals   Comments We provide 2 educational sessions on heart healthy nutrition with handouts.      Intervention Plan   Intervention Nutrition handout(s) given to patient.           Nutrition Discharge:  Nutrition Assessments - 06/17/20 1258      MEDFICTS Scores   Pre Score 9           Education Questionnaire Score:  Knowledge Questionnaire Score - 06/17/20 1304      Knowledge Questionnaire Score   Pre Score 22/24          Patient was discharged because he was having chest pain at rest. He went to see his cardiologist and they are performing a CABG procedure.

## 2020-08-16 NOTE — Progress Notes (Signed)
Pre cabg has been completed.   Preliminary results in CV Proc.   [name] [name] 08/16/2020 11:43 AM

## 2020-08-16 NOTE — Progress Notes (Addendum)
Progress Note  Patient Name: [name] [name] Date of Encounter: 08/16/2020  [facility] Cardiologist: Rozann Lesches, MD   Subjective   Patient states he is feeling well. He was seen by CT surgery this morning, he was told that he can go home and come back for surgery on 08/21/20. He really wishes to go home. He states he has no chest pain, able to tolerating activity such as walking. He denied any SOB, dizziness.    Inpatient Medications    Scheduled Meds: . aspirin EC  81 mg Oral Daily  . atorvastatin  80 mg Oral Daily  . carvedilol  18.75 mg Oral BID  . lisinopril  20 mg Oral Daily   And  . hydrochlorothiazide  12.5 mg Oral Daily  . insulin aspart  0-15 Units Subcutaneous TID WC  . insulin glargine  44 Units Subcutaneous Daily  . insulin glargine  75 Units Subcutaneous QHS  . isosorbide mononitrate  60 mg Oral Daily  . sodium chloride flush  3 mL Intravenous Q12H  . tamsulosin  0.4 mg Oral Daily   Continuous Infusions: . sodium chloride     PRN Meds: sodium chloride, acetaminophen, morphine injection, nitroGLYCERIN, ondansetron (ZOFRAN) IV, pantoprazole, sodium chloride flush   Vital Signs    Vitals:   08/15/20 1054 08/15/20 1625 08/16/20 0511 08/16/20 0841  BP: 116/65 125/71 (!) 146/79 (!) 161/79  Pulse: 66 79 85 90  Resp:  17 16   Temp:  98.8 F (37.1 C) 98.8 F (37.1 C)   TempSrc:  Oral Oral   SpO2: 93% 93% 95%   Weight:      Height:        Intake/Output Summary (Last 24 hours) at 08/16/2020 0853 Last data filed at 08/16/2020 0845 Gross per 24 hour  Intake 1386 ml  Output --  Net 1386 ml   Last 3 Weights 08/15/2020 08/15/2020 08/06/2020  Weight (lbs) 242 lb 6.4 oz 240 lb 243 lb  Weight (kg) 109.952 kg 108.863 kg 110.224 kg      Telemetry    Sinus rhythm with rate of 60-80s, artifacts, occasional PVCs, and <2 seconds pauses noted- Personally Reviewed  ECG    No new tracing today  - Personally Reviewed  Physical Exam   GEN: No acute  distress.  Sitting in chair. Obese.  Neck: No JVD Cardiac: RRR, no murmurs, rubs, or gallops.  Respiratory: Clear to auscultation bilaterally.on room air. Speaks in full sentence.  GI: Abdomen obese. Soft, nontender, non-distended  MS: RLE trace edema; No deformity. Neuro:  Alert and oriented x3. Follow commands appropriately.  Psych: Normal affect  Right radial site with dressing in place, no hematoma   Labs    High Sensitivity Troponin:  No results for input(s): TROPONINIHS in the last 720 hours.    Chemistry Recent Labs  Lab 08/16/20 0319  NA 135  K 4.1  CL 100  CO2 26  GLUCOSE 182*  BUN 15  CREATININE 1.00  CALCIUM 9.1  GFRNONAA >60  ANIONGAP 9     Hematology Recent Labs  Lab 08/16/20 0319  WBC 6.6  RBC 4.83  HGB 13.7  HCT 42.1  MCV 87.2  MCH 28.4  MCHC 32.5  RDW 14.7  PLT 228    BNPNo results for input(s): BNP, PROBNP in the last 168 hours.   DDimer No results for input(s): DDIMER in the last 168 hours.   Radiology    CARDIAC CATHETERIZATION  Result Date: 08/15/2020  Prox RCA  lesion is 70% stenosed.  LPAV lesion is 100% stenosed.  Ost LAD to Prox LAD lesion is 95% stenosed.  Mid LAD lesion is 80% stenosed.  2nd Mrg lesion is 90% stenosed.  Previously placed 1st LPL stent (unknown type) is widely patent.  ALROY PORTELA is a 65 y.o. male  [id number] LOCATION:  FACILITY: [facility] PHYSICIAN: Quay Burow, M.D. November 12, 1955 DATE OF PROCEDURE:  08/15/2020 DATE OF DISCHARGE: CARDIAC CATHETERIZATION History obtained from chart review.  Mr. Rodeheaver is a 65 year old moderately overweight married Caucasian male patient of Dr. Inocente Salles McDowell's with a history of hypertension, hyperlipidemia and diabetes.  He had LAD atherectomy November 2021 by Dr. Sophronia Simas.  He has a left dominant system.  He underwent repeat catheterization by Dr. Irish Lack 05/22/2020 with aggressive in-stent restenosis in the proximal and mid LAD stents.  He underwent Cutting Balloon angioplasty with OCT  guidance with placement of an additional 3.5 x 12 mm long drug-eluting stent in the distal edge of the proximal small stent because of edge dissection.  He also has a 70% mid nondominant RCA and obtuse marginal branch disease with an occluded PDA.  He has had accelerated angina while exercising in rehab and was referred as an outpatient for diagnostic coronary angiography. PROCEDURE DESCRIPTION: The patient was brought to the second floor  Cardiac cath lab in the postabsorptive state. He was premedicated with IV Versed and fentanyl. His right wristwas prepped and shaved in usual sterile fashion. Xylocaine 1% was used for local anesthesia. A 6 French sheath was inserted into the right radial artery using standard Seldinger technique. The patient received 6000 units  of heparin intravenously.  A 5 [place] TIG catheter and pigtail catheters were used for selective coronary angiography and obtain left heart pressures.  Isovue dye was used for the entirety of the case (60 cc administered to patient).  Retrograde aortic and left ventricular and pullback pressures were recorded.  Radial cocktail was administered via the SideArm sheath.   Mr. Bedwell has aggressive in-stent restenosis within the ostial/proximal LAD stent and mid LAD stent with obtuse marginal branch disease and an occluded PDA.  His last EF by hand-injection was normal.  He will need coronary artery bypass graft for complete revascularization giving aggressive early in-stent restenosis.  He is on Effient which was discontinued and he will need Effient washout prior to revascularization.  Given the accelerated nature of his symptoms and the degree of severity of his stenosis I favor hospitalization prior to revascularization.  The sheath was removed and a TR band was placed on the right wrist to achieve patent hemostasis.  The patient left lab in stable condition. Quay Burow. MD, [facility] 08/15/2020 9:50 AM    Cardiac Studies   Cardiac cath  08/15/20:   Prox RCA lesion is 70% stenosed.  LPAV lesion is 100% stenosed.  Ost LAD to Prox LAD lesion is 95% stenosed.  Mid LAD lesion is 80% stenosed.  2nd Mrg lesion is 90% stenosed.  Previously placed 1st LPL stent (unknown type) is widely patent.  IMPRESSION: Mr. Purnell has aggressive in-stent restenosis within the ostial/proximal LAD stent and mid LAD stent with obtuse marginal branch disease and an occluded PDA.  His last EF by hand-injection was normal.  He will need coronary artery bypass graft for complete revascularization giving aggressive early in-stent restenosis.  He is on Effient which was discontinued and he will need Effient washout prior to revascularization.  Given the accelerated nature of his symptoms and the degree of  severity of his stenosis I favor hospitalization prior to revascularization.  The sheath was removed and a TR band was placed on the right wrist to achieve patent hemostasis.  The patient left lab in stable condition.   Cardiac catheterization 05/22/2020   Prox RCA lesion is 70% stenosed.  Dist LAD lesion is 60% stenosed.  1st Mrg lesion is 85% stenosed.  1st LPL-2 lesion is 80% stenosed.  Previously placed 1st LPL-1 drug eluting stent is widely patent.  LPAV-2 lesion is 100% stenosed.  Dist LM to Prox LAD lesion is 90% stenosed, instent restenosis. Treated with 3.25 mm Wolverine and then 4.0 [place] Number One balloon. Dissection noted by OCT past the edge of old stent. A drug-eluting stent was successfully placed using a SYNERGY XD 3.50X12., post dilated to 4.0 mm.  Post intervention, there is a 0% residual stenosis. Stent optimized with OCT.  Mid LAD-1 lesion is 20% stenosed.  Mid LAD-2 lesion is 75% stenosed. In-stent restenosis. Scoring balloon angioplasty was performed using a BALLOON WOLVERINE 3.25X10.  Post intervention, there is a 0% residual stenosis.  The left ventricular systolic function is normal.  LV end diastolic pressure is  normal.  The left ventricular ejection fraction is 55-65% by visual estimate.  There is no aortic valve stenosis.  A drug-eluting stent was successfully placed using a SYNERGY XD 3.50X12.  Continue dual antiplatelet therapy along with aggressive secondary prevention.   Diffuse, distal and small vessel disease in the LAD and OM territories. Aggressive medical therapy.   Diagnostic Dominance: Left    Intervention      Echocardiogram 02/08/2020: 1. Left ventricular ejection fraction, by estimation, is 60 to 65%. The  left ventricle has normal function. The left ventricle has no regional  wall motion abnormalities. Left ventricular diastolic parameters are  consistent with Grade I diastolic  dysfunction (impaired relaxation).  2. Right ventricular systolic function is normal. The right ventricular  size is normal. Tricuspid regurgitation signal is inadequate for assessing  PA pressure.  3. The mitral valve is normal in structure. No evidence of mitral valve  regurgitation. No evidence of mitral stenosis.  4. The aortic valve is tricuspid. Aortic valve regurgitation is not  visualized. Mild aortic valve sclerosis is present, with no evidence of  aortic valve stenosis.  5. Aortic dilatation noted. There is mild dilatation of the ascending  aorta, measuring 40 mm.    PCI 02/09/2020:  Prox RCA lesion is 70% stenosed.  Dist LM to Prox LAD lesion is 95% stenosed.  Dist LAD lesion is 60% stenosed.  Mid LAD lesion is 20% stenosed.  Prox LAD to Mid LAD lesion is 20% stenosed.  1st Mrg lesion is 85% stenosed.  1st LPL-1 lesion is 95% stenosed.  1st LPL-2 lesion is 80% stenosed.  LPAV-1 lesion is 30% stenosed.  LPAV-2 lesion is 100% stenosed.  Post intervention, there is a 0% residual stenosis.  A drug-eluting stent was successfully placed using a SYNERGY XD 3.50X12.  Post intervention, there is a 0% residual stenosis.  A drug-eluting stent was  successfully placed using a SYNERGY XD 2.25X16.  1. Successful IVUS guided orbital atherectomy and drug eluting stent placement to the ostial LAD. 2. Successful angioplasty and drug-eluting stent placement to first PL branch of the left circumflex.      Patient Profile     65 y.o. male with PMH of CAD s/p LAD atherectomy 01/2020 and multiple cardiac cath with PCIs with DES complicated by recurrent in-stent stenosis, hx of V fib arrest  in 2012,  HTN, HLD, type 2 DM, obesity, GERD, anxiety, who is arranged for elective cardiac cath on 08/15/20 due to recurrent exertional chest pain. Cath 5/19 revealing aggressive in-stent restenosis within the ostial/proximal LAD stent and mid LAD stent with obtuse marginal branch disease and an occluded PDA. He is evaluated by CTS, plan for CABG on 08/21/20 upon wash out of Prasugrel.    Assessment & Plan    Unstable angina Severe multivessel CAD s/p multiple PCIs with recurrent in-stent stenosis  - last cardiac cath on 08/15/20: aggressive in-stent restenosis within the ostial/proximal LAD stent and mid LAD stent with obtuse marginal branch disease and an occluded PDA.  - evaluated by CTS Dr Roxan Hockey, recommend CABG on 08/21/20 upon wash out of Prasugrel.  - continue medical therapy with ASA, coreg, imdur, lininopril, and statin  - patient wishes to go home and return 5/25 for CABG, will discuss with attending regarding possible DC plan    HTN - BP mildly elevated, patient states SBP usually runs at 120s at home, continue coreg, imdur, lisinopril-HCTZ   HLD - LDL 30 from 08/16/20, continue current dose statin   Type 2 DM - A1C 8.2 % on 08/16/20  - on lantus 44 unit AM and 75 unit PM and metformin and semaglutide at home  - will start Novolog insulin SSI AC HS, resume Lantus 44 unit AM and 75 unit PM, and hold metformin and semaglutide while in house  - continue [place]   GERD - continue PPI  BPH - will resume Flomax     For questions or  updates, please contact [facility] Please consult [email or web address] for contact info under    Signed, Margie Billet, NP  08/16/2020, 8:53 AM    Patient seen, examined. Available data reviewed. Agree with findings, assessment, and plan as outlined by Margie Billet, NP.  The patient is independently interviewed and examined.  He is a pleasant, obese male in no distress.  Lungs are clear, heart is regular rate and rhythm with no murmur gallop, abdomen is soft, obese, nontender, extremities have no edema, right radial site is clear.  I personally reviewed the patient's cardiac catheterization films which demonstrate high-grade proximal/ostial LAD stenosis.  The patient is interviewed carefully about his symptoms.  He has been experiencing chest discomfort with a burning-like sensation in the left chest only when he walks on the treadmill during cardiac rehab or walks up a hill.  He has had no resting symptoms like this.  He does get a discomfort in his upper back that goes forward to the chest when he sits in a chair for too long.  This is relieved with applying heat to the area.  This is clearly not anginal pain.  I think the patient is stable for hospital discharge to return next week for coronary bypass surgery.  He is scheduled next Wednesday with Dr. Roxan Hockey to allow for prasugrel washout.  He is counseled regarding use of sublingual nitroglycerin and given precautions regarding repeat evaluation/emergency medical care if he experiences resting chest pain unrelieved by a single nitroglycerin.  He will need to have pre-CABG Dopplers and his echocardiogram completed before he is discharged from the hospital today.  Sherren Mocha, M.D. 08/16/2020 10:45 AM

## 2020-08-16 NOTE — Discharge Summary (Signed)
Discharge Summary    Patient ID: [name] [name] MRN: [id number]; DOB: 1956-01-02  Admit date: 08/15/2020 Discharge date: 08/16/2020  PCP:  Chevis Pretty, [place] Prov[facility] Cardiologist:  Rozann Lesches, MD        Discharge Diagnoses    Principal Problem:   CAD (coronary artery disease) Active Problems:   Essential hypertension, benign   Hyperlipidemia with target LDL less than 70   Diabetes mellitus ([place])   Accelerating angina [facility])    Diagnostic Studies/Procedures    Echo from 08/16/20 pending  Carotid doppler 08/16/20 pending    Cardiac cath 08/15/20:   Prox RCA lesion is 70% stenosed.  LPAV lesion is 100% stenosed.  Ost LAD to Prox LAD lesion is 95% stenosed.  Mid LAD lesion is 80% stenosed.  2nd Mrg lesion is 90% stenosed.  Previously placed 1st LPL stent (unknown type) is widely patent.  IMPRESSION:[name] has aggressive in-stent restenosis within the ostial/proximal LAD stent and mid LAD stent with obtuse marginal branch disease and an occluded PDA. His last EF by hand-injection was normal. He will need coronary artery bypass graft for complete revascularization giving aggressive early in-stent restenosis. He is on Effient which was discontinued and he will need Effient washout prior to revascularization. Given the accelerated nature of his symptoms and the degree of severity of his stenosis I favor hospitalization prior to revascularization. The sheath was removed and a TR band was placed on the right wrist to achieve patent hemostasis. The patient left lab in stable condition.   Cardiac catheterization 05/22/2020   Prox RCA lesion is 70% stenosed.  Dist LAD lesion is 60% stenosed.  1st Mrg lesion is 85% stenosed.  1st LPL-2 lesion is 80% stenosed.  Previously placed 1st LPL-1 drug eluting stent is widely patent.  LPAV-2 lesion is 100% stenosed.  Dist LM to Prox LAD lesion is 90% stenosed, instent restenosis.  Treated with 3.25 mm Wolverine and then 4.0 [place] balloon. Dissection noted by OCT past the edge of old stent. A drug-eluting stent was successfully placed using a SYNERGY XD 3.50X12., post dilated to 4.0 mm.  Post intervention, there is a 0% residual stenosis. Stent optimized with OCT.  Mid LAD-1 lesion is 20% stenosed.  Mid LAD-2 lesion is 75% stenosed. In-stent restenosis. Scoring balloon angioplasty was performed using a BALLOON WOLVERINE 3.25X10.  Post intervention, there is a 0% residual stenosis.  The left ventricular systolic function is normal.  LV end diastolic pressure is normal.  The left ventricular ejection fraction is 55-65% by visual estimate.  There is no aortic valve stenosis.  A drug-eluting stent was successfully placed using a SYNERGY XD 3.50X12.  Continue dual antiplatelet therapy along with aggressive secondary prevention.   Diffuse, distal and small vessel disease in the LAD and OM territories. Aggressive medical therapy.   Diagnostic Dominance: Left    Intervention      Echocardiogram 02/08/2020: 1. Left ventricular ejection fraction, by estimation, is 60 to 65%. The  left ventricle has normal function. The left ventricle has no regional  wall motion abnormalities. Left ventricular diastolic parameters are  consistent with Grade I diastolic  dysfunction (impaired relaxation).  2. Right ventricular systolic function is normal. The right ventricular  size is normal. Tricuspid regurgitation signal is inadequate for assessing  PA pressure.  3. The mitral valve is normal in structure. No evidence of mitral valve  regurgitation. No evidence of mitral stenosis.  4. The aortic valve is tricuspid. Aortic valve regurgitation is  not  visualized. Mild aortic valve sclerosis is present, with no evidence of  aortic valve stenosis.  5. Aortic dilatation noted. There is mild dilatation of the ascending  aorta, measuring 40 mm.    PCI  02/09/2020:  Prox RCA lesion is 70% stenosed.  Dist LM to Prox LAD lesion is 95% stenosed.  Dist LAD lesion is 60% stenosed.  Mid LAD lesion is 20% stenosed.  Prox LAD to Mid LAD lesion is 20% stenosed.  1st Mrg lesion is 85% stenosed.  1st LPL-1 lesion is 95% stenosed.  1st LPL-2 lesion is 80% stenosed.  LPAV-1 lesion is 30% stenosed.  LPAV-2 lesion is 100% stenosed.  Post intervention, there is a 0% residual stenosis.  A drug-eluting stent was successfully placed using a SYNERGY XD 3.50X12.  Post intervention, there is a 0% residual stenosis.  A drug-eluting stent was successfully placed using a SYNERGY XD 2.25X16.  1. Successful IVUS guided orbital atherectomy and drug eluting stent placement to the ostial LAD. 2. Successful angioplasty and drug-eluting stent placement to first PL branch of the left circumflex.   _____________   History of Present Illness     [name] [name] is a 65 y.o. male with PMH of CAD, HTN, HLD, type 2 DM, GERD, and BPH, who presented on 08/15/20 for elective cardiac cath due to recurrent exertional chest pain.   Patient follows Dr Domenic Polite at baseline, had ongoing intermittent c/o of chest pain at cardiac rehab with exertional activities since Feb 2022. He had underwent multiple cardiac cath in the past, underwent LAD atherectomy and DES to LAD on 01/2020.   Subsequent cath on 05/22/20 showed in-stent restenosis 90% distal LM to proximal LAD. Treated with wolverine then balloon. Dissection noted by OCT past edge of old stent. DES successfully placed. Mid LAD - 2 lesion in-stent restenosis 75% with scoring balloon. Diffuse distal and small vessel disease in LAD and OM territories. (see report doe detail).  Aggressive medical therapy / secondary prevention was recommended.   He was lasted evaluated  by APP Mr Leonides Sake in the office on 08/06/20 and again c/o recurrent chest pain and SOB with exertional activities at cardia rehab. Pain resolved with  Nitro. He was arranged for cardiac cath on 08/15/20.      Hospital Course     Consultants: CT surgery   Severe multivessel CAD s/p multiple PCIs with recurrent in-stent stenosis  - cardiac cath on 08/15/20: aggressive in-stent restenosis within the ostial/proximal LAD stent and mid LAD stent with obtuse marginal branch disease and an occluded PDA.  - evaluated by CTS Dr Roxan Hockey, recommend CABG on 08/21/20 upon wash out of Prasugrel.  - continue medical therapy with ASA, coreg, imdur, lininopril, and statin; HOLD Prasugrel - patient wishes to go home and return 5/25 for CABG, reports his CT surgeon is OK with him going home, his chest pain occurs with exertion and relieved by nitro and resting, otherwise HD stable. Advised patient to avoid exertional activities, PRN Nitro for symptoms, return ASAP if chest pain not improving with Nitro or resting, otherwise return Wednesday 08/21/20 for planned CABG - will complete pre-op Echo and Cartoid doppler before discharge today   HTN - BP mildly elevated, patient states SBP usually runs at 120s at home, continue coreg, imdur, lisinopril-HCTZ   HLD - LDL 30 from 08/16/20, continue current dose statin   Type 2 DM - A1C 8.2 % on 08/16/20  - on lantus 44 unit AM and 75 unit PM and metformin  and semaglutide at home - resume insulin regimen and semaglutide at home , metformin to be resumed after 48 hours post cath on 08/18/20   GERD - continue PPI  BPH - continue Flomax     Did the patient have an acute coronary syndrome (MI, NSTEMI, STEMI, etc) this admission?:  No                               Did the patient have a percutaneous coronary intervention (stent / angioplasty)?:  No.       _____________  Discharge Vitals Blood pressure (!) 161/79, pulse 90, temperature 98.8 F (37.1 C), temperature source Oral, resp. rate 16, height _0  (1.753 m), weight 110 kg, SpO2 95 %.  Filed Weights   08/15/20 0714 08/15/20 1024  Weight: 108.9 kg  110 kg    Labs & Radiologic Studies    CBC Recent Labs    08/16/20 0319  WBC 6.6  HGB 13.7  HCT 42.1  MCV 87.2  PLT 035   Basic Metabolic Panel Recent Labs    08/16/20 0319  NA 135  K 4.1  CL 100  CO2 26  GLUCOSE 182*  BUN 15  CREATININE 1.00  CALCIUM 9.1   Liver Function Tests No results for input(s): AST, ALT, ALKPHOS, BILITOT, PROT, ALBUMIN in the last 72 hours. No results for input(s): LIPASE, AMYLASE in the last 72 hours. High Sensitivity Troponin:   No results for input(s): TROPONINIHS in the last 720 hours.  BNP Invalid input(s): POCBNP D-Dimer No results for input(s): DDIMER in the last 72 hours. Hemoglobin A1C Recent Labs    08/16/20 0319  HGBA1C 8.2*   Fasting Lipid Panel Recent Labs    08/16/20 0319  CHOL 77  HDL 21*  LDLCALC 30  TRIG 128  CHOLHDL 3.7   Thyroid Function Tests No results for input(s): TSH, T4TOTAL, T3FREE, THYROIDAB in the last 72 hours.  Invalid input(s): FREET3 _____________  CARDIAC CATHETERIZATION  Result Date: 08/15/2020  Prox RCA lesion is 70% stenosed.  LPAV lesion is 100% stenosed.  Ost LAD to Prox LAD lesion is 95% stenosed.  Mid LAD lesion is 80% stenosed.  2nd Mrg lesion is 90% stenosed.  Previously placed 1st LPL stent (unknown type) is widely patent.  KENDARRIUS TANZI is a 65 y.o. male  [id number] LOCATION:  FACILITY: [facility] PHYSICIAN: Quay Burow, M.D. 07/02/1955 DATE OF PROCEDURE:  08/15/2020 DATE OF DISCHARGE: CARDIAC CATHETERIZATION History obtained from chart review.  Mr. Mecca is a 65 year old moderately overweight married Caucasian male patient of Dr. Inocente Salles McDowell's with a history of hypertension, hyperlipidemia and diabetes.  He had LAD atherectomy November 2021 by Dr. Sophronia Simas.  He has a left dominant system.  He underwent repeat catheterization by Dr. Irish Lack 05/22/2020 with aggressive in-stent restenosis in the proximal and mid LAD stents.  He underwent [place] Balloon angioplasty with OCT guidance with  placement of an additional 3.5 x 12 mm long drug-eluting stent in the distal edge of the proximal small stent because of edge dissection.  He also has a 70% mid nondominant RCA and obtuse marginal branch disease with an occluded PDA.  He has had accelerated angina while exercising in rehab and was referred as an outpatient for diagnostic coronary angiography. PROCEDURE DESCRIPTION: The patient was brought to the second floor [place] Cardiac cath lab in the postabsorptive state. He was premedicated with IV Versed and fentanyl. His right wristwas prepped  and shaved in usual sterile fashion. Xylocaine 1% was used for local anesthesia. A 6 French sheath was inserted into the right radial artery using standard Seldinger technique. The patient received 6000 units  of heparin intravenously.  A 5 [place] TIG catheter and pigtail catheters were used for selective coronary angiography and obtain left heart pressures.  Isovue dye was used for the entirety of the case (60 cc administered to patient).  Retrograde aortic and left ventricular and pullback pressures were recorded.  Radial cocktail was administered via the SideArm sheath.   Mr. Oakley has aggressive in-stent restenosis within the ostial/proximal LAD stent and mid LAD stent with obtuse marginal branch disease and an occluded PDA.  His last EF by hand-injection was normal.  He will need coronary artery bypass graft for complete revascularization giving aggressive early in-stent restenosis.  He is on Effient which was discontinued and he will need Effient washout prior to revascularization.  Given the accelerated nature of his symptoms and the degree of severity of his stenosis I favor hospitalization prior to revascularization.  The sheath was removed and a TR band was placed on the right wrist to achieve patent hemostasis.  The patient left lab in stable condition. Quay Burow. MD, [facility] 08/15/2020 9:50 AM   Disposition   Pt is being discharged home today in  good condition.  Follow-up Plans & Appointments     Discharge Instructions    Diet - low sodium heart healthy   Complete by: As directed    Discharge instructions   Complete by: As directed    Please do not engage in any activity that is exertional, return to ER immediately if chest pain re-occurs   Return on 08/21/20 for your planned CABG surgery   HOLD your medication Effient   Resume your medication Metformin on 08/18/20 (48 hours after cardiac cath)    Radial Site Care  Refer to this sheet in the next few weeks. These instructions provide you with information on caring for yourself after your procedure. Your caregiver may also give you more specific instructions. Your treatment has been planned according to current medical practices, but problems sometimes occur. Call your caregiver if you have any problems or questions after your procedure.  HOME CARE INSTRUCTIONS  You may shower the day after the procedure.Remove the bandage (dressing) and gently wash the site with plain soap and water.Gently pat the site dry.  Do not apply powder or lotion to the site.  Do not submerge the affected site in water for 3 to 5 days.  Inspect the site at least twice daily.  Do not flex or bend the affected arm for 24 hours.  No lifting over 5 pounds (2.3 kg) for 5 days after your procedure.  Do not drive home if you are discharged the same day of the procedure. Have someone else drive you.  You may drive 24 hours after the procedure unless otherwise instructed by your caregiver.   What to expect: Any bruising will usually fade within 1 to 2 weeks.  Blood that collects in the tissue (hematoma) may be painful to the touch. It should usually decrease in size and tenderness within 1 to 2 weeks.   SEEK IMMEDIATE MEDICAL CARE IF: You have unusual pain at the radial site.  You have redness, warmth, swelling, or pain at the radial site.  You have drainage (other than a small amount of blood on the  dressing).  You have chills.  You have a fever or  persistent symptoms for more than 72 hours.  You have a fever and your symptoms suddenly get worse.  Your arm becomes pale, cool, tingly, or numb.  You have heavy bleeding from the site. Hold pressure on the site.   Increase activity slowly   Complete by: As directed       Discharge Medications   Allergies as of 08/16/2020   No Known Allergies     Medication List    STOP taking these medications   prasugrel 10 MG Tabs tablet Commonly known as: EFFIENT     TAKE these medications   Accu-Chek Aviva Plus test strip Generic drug: glucose blood Check BS up to 4 times a day dx E11.8   Accu-Chek Softclix Lancets lancets USE TO CHECK BLOOD SUGAR UP TO 4 TIMES DAILY.   aspirin 81 MG tablet Take 81 mg by mouth daily.   atorvastatin 80 MG tablet Commonly known as: LIPITOR Take 1 tablet (80 mg total) by mouth daily.   blood glucose meter kit and supplies Dispense based on patient and insurance preference. Use up to four times daily as directed. (FOR ICD-10 E10.9, E11.9).   carvedilol 12.5 MG tablet Commonly known as: COREG Take 1.5 tablets (18.75 mg total) by mouth 2 (two) times daily.   cetirizine 10 MG tablet Commonly known as: ZYRTEC Take 10 mg by mouth daily.   Cinnamon 500 MG Tabs Take 1,000 mg by mouth 2 (two) times daily.   CRANBERRY PO Take 4,200 mg by mouth 2 (two) times daily.   Global Ease Inject Pen Needles 31G X 8 MM Misc Generic drug: Insulin Pen Needle Use up to 8 times a day Dx E11.8   Insulin Aspart FlexPen 100 UNIT/ML Sopn INJECT UP TO 45 UNITS EVERY DAY What changed:   how much to take  how to take this  when to take this  additional instructions   isosorbide mononitrate 60 MG 24 hr tablet Commonly known as: IMDUR Take 1 tablet (60 mg total) by mouth daily.   Lantus SoloStar 100 UNIT/ML Solostar Pen Generic drug: insulin glargine 44u in morning and 75 at night What changed:   how  much to take  how to take this  when to take this  additional instructions   lisinopril-hydrochlorothiazide 20-12.5 MG tablet Commonly known as: Zestoretic Take 1 tablet by mouth daily.   metFORMIN 500 MG tablet Commonly known as: GLUCOPHAGE TAKE 2 TABLETS BY MOUTHTWICE DAILY. What changed:   how much to take  how to take this  when to take this   nitroGLYCERIN 0.4 MG SL tablet Commonly known as: NITROSTAT Place 1 tablet (0.4 mg total) under the tongue every 5 (five) minutes as needed for chest pain.   Ozempic (0.25 or 0.5 MG/DOSE) 2 MG/1.5ML Sopn Generic drug: Semaglutide(0.25 or 0.5MG/DOS) INJECT 0.375 MLS (0.5 MG TOTAL) INTO THE SKIN ONCE A WEEK. What changed:   how much to take  how to take this  when to take this   pantoprazole 40 MG tablet Commonly known as: PROTONIX Take 1 tablet (40 mg total) by mouth daily as needed (for acid reflux).   tamsulosin 0.4 MG Caps capsule Commonly known as: FLOMAX Take 1 capsule (0.4 mg total) by mouth at bedtime.          Outstanding Labs/Studies     Duration of Discharge Encounter   Greater than 30 minutes including physician time.  Signed, Margie Billet, NP 08/16/2020, 10:56 AM

## 2020-08-16 NOTE — Progress Notes (Signed)
[id number] Pt has [facility] booklet and IS. Gave him care guide and staying in the tube handout. Discussed sternal precautions. Discussed importance of IS and mobility after surgery. 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 Wife will be available to assist in care after discharge post surgery. Graylon Good RN BSN 08/16/2020 11:42 AM

## 2020-08-16 NOTE — Care Management (Signed)
Case management spoke with Brandt Loosen, RN with Utilization Review and she is going to discontinue Code 44.  [place] form and Code 44 is not needed and patient is to be discharged home with family with appropriate discharge instructions from the physician and follow up.  I spoke with Alda Lea, RN at bedside and she is aware and will discharge the patient to home accordingly.

## 2020-08-16 NOTE — Progress Notes (Signed)
1 Day Post-Op Procedure(s) (LRB): LEFT HEART CATH AND CORONARY ANGIOGRAPHY (N/A) Subjective: No CP, SOB  Objective: Vital signs in last 24 hours: Temp:  [98.6 F (37 C)-98.8 F (37.1 C)] 98.8 F (37.1 C) (05/20 0511) Pulse Rate:  [62-85] 85 (05/20 0511) Cardiac Rhythm: Normal sinus rhythm (05/19 2130) Resp:  [15-18] 16 (05/20 0511) BP: (109-146)/(61-79) 146/79 (05/20 0511) SpO2:  [93 %-99 %] 95 % (05/20 0511) Weight:  [110 kg] 110 kg (05/19 1024)  Hemodynamic parameters for last 24 hours:    Intake/Output from previous day: 05/19 0701 - 05/20 0700 In: 903 [P.O.:600; I.V.:303] Out: -  Intake/Output this shift: Total I/O In: 480 [P.O.:480] Out: -   General appearance: alert, cooperative and no distress  Lab Results: Recent Labs    08/16/20 0319  WBC 6.6  HGB 13.7  HCT 42.1  PLT 228   BMET:  Recent Labs    08/16/20 0319  NA 135  K 4.1  CL 100  CO2 26  GLUCOSE 182*  BUN 15  CREATININE 1.00  CALCIUM 9.1    PT/INR: No results for input(s): LABPROT, INR in the last 72 hours. ABG    Component Value Date/Time   TCO2 22 01/08/2015 0803   CBG (last 3)  Recent Labs    08/15/20 1622 08/15/20 2129 08/16/20 0741  GLUCAP 172* 175* 289*    Assessment/Plan: S/P Procedure(s) (LRB): LEFT HEART CATH AND CORONARY ANGIOGRAPHY (N/A) - 3 vessel CAD  No issues overnight For CABG next week after prasugrel washout    LOS: 1 day    Melrose Nakayama 08/16/2020

## 2020-08-18 ENCOUNTER — Other Ambulatory Visit: Payer: Self-pay

## 2020-08-18 ENCOUNTER — Inpatient Hospital Stay (HOSPITAL_COMMUNITY)
Admission: EM | Admit: 2020-08-18 | Discharge: 2020-08-26 | DRG: 236 | Disposition: A | Discharge status: Home / Self Care | Payer: Medicare PPO | Attending: Thoracic Surgery (Cardiothoracic Vascular Surgery) | Admitting: Thoracic Surgery (Cardiothoracic Vascular Surgery)

## 2020-08-18 ENCOUNTER — Emergency Department (HOSPITAL_COMMUNITY): Payer: Medicare PPO

## 2020-08-18 DIAGNOSIS — E1169 Type 2 diabetes mellitus with other specified complication: Secondary | ICD-10-CM | POA: Diagnosis present

## 2020-08-18 DIAGNOSIS — E782 Mixed hyperlipidemia: Secondary | ICD-10-CM | POA: Diagnosis present

## 2020-08-18 DIAGNOSIS — N401 Enlarged prostate with lower urinary tract symptoms: Secondary | ICD-10-CM | POA: Diagnosis present

## 2020-08-18 DIAGNOSIS — Z8674 Personal history of sudden cardiac arrest: Secondary | ICD-10-CM

## 2020-08-18 DIAGNOSIS — Z83438 Family history of other disorder of lipoprotein metabolism and other lipidemia: Secondary | ICD-10-CM

## 2020-08-18 DIAGNOSIS — Z833 Family history of diabetes mellitus: Secondary | ICD-10-CM

## 2020-08-18 DIAGNOSIS — Z79899 Other long term (current) drug therapy: Secondary | ICD-10-CM

## 2020-08-18 DIAGNOSIS — I440 Atrioventricular block, first degree: Secondary | ICD-10-CM | POA: Diagnosis present

## 2020-08-18 DIAGNOSIS — I25110 Atherosclerotic heart disease of native coronary artery with unstable angina pectoris: Secondary | ICD-10-CM | POA: Diagnosis present

## 2020-08-18 DIAGNOSIS — I2510 Atherosclerotic heart disease of native coronary artery without angina pectoris: Secondary | ICD-10-CM | POA: Diagnosis present

## 2020-08-18 DIAGNOSIS — I249 Acute ischemic heart disease, unspecified: Secondary | ICD-10-CM | POA: Diagnosis not present

## 2020-08-18 DIAGNOSIS — J90 Pleural effusion, not elsewhere classified: Secondary | ICD-10-CM | POA: Diagnosis not present

## 2020-08-18 DIAGNOSIS — J984 Other disorders of lung: Secondary | ICD-10-CM | POA: Diagnosis not present

## 2020-08-18 DIAGNOSIS — I10 Essential (primary) hypertension: Secondary | ICD-10-CM | POA: Diagnosis present

## 2020-08-18 DIAGNOSIS — I252 Old myocardial infarction: Secondary | ICD-10-CM | POA: Diagnosis not present

## 2020-08-18 DIAGNOSIS — E118 Type 2 diabetes mellitus with unspecified complications: Secondary | ICD-10-CM | POA: Diagnosis not present

## 2020-08-18 DIAGNOSIS — J9811 Atelectasis: Secondary | ICD-10-CM | POA: Diagnosis not present

## 2020-08-18 DIAGNOSIS — Z6836 Body mass index (BMI) 36.0-36.9, adult: Secondary | ICD-10-CM | POA: Diagnosis not present

## 2020-08-18 DIAGNOSIS — Z7984 Long term (current) use of oral hypoglycemic drugs: Secondary | ICD-10-CM

## 2020-08-18 DIAGNOSIS — I200 Unstable angina: Secondary | ICD-10-CM

## 2020-08-18 DIAGNOSIS — R001 Bradycardia, unspecified: Secondary | ICD-10-CM | POA: Diagnosis not present

## 2020-08-18 DIAGNOSIS — E785 Hyperlipidemia, unspecified: Secondary | ICD-10-CM | POA: Diagnosis present

## 2020-08-18 DIAGNOSIS — K219 Gastro-esophageal reflux disease without esophagitis: Secondary | ICD-10-CM | POA: Diagnosis present

## 2020-08-18 DIAGNOSIS — R000 Tachycardia, unspecified: Secondary | ICD-10-CM | POA: Diagnosis not present

## 2020-08-18 DIAGNOSIS — E669 Obesity, unspecified: Secondary | ICD-10-CM | POA: Diagnosis present

## 2020-08-18 DIAGNOSIS — I214 Non-ST elevation (NSTEMI) myocardial infarction: Principal | ICD-10-CM | POA: Diagnosis present

## 2020-08-18 DIAGNOSIS — R079 Chest pain, unspecified: Secondary | ICD-10-CM | POA: Diagnosis present

## 2020-08-18 DIAGNOSIS — R0689 Other abnormalities of breathing: Secondary | ICD-10-CM | POA: Diagnosis not present

## 2020-08-18 DIAGNOSIS — E8770 Fluid overload, unspecified: Secondary | ICD-10-CM | POA: Diagnosis not present

## 2020-08-18 DIAGNOSIS — D62 Acute posthemorrhagic anemia: Secondary | ICD-10-CM | POA: Diagnosis not present

## 2020-08-18 DIAGNOSIS — Z20822 Contact with and (suspected) exposure to covid-19: Secondary | ICD-10-CM | POA: Diagnosis present

## 2020-08-18 DIAGNOSIS — E119 Type 2 diabetes mellitus without complications: Secondary | ICD-10-CM | POA: Diagnosis not present

## 2020-08-18 DIAGNOSIS — R3911 Hesitancy of micturition: Secondary | ICD-10-CM | POA: Diagnosis present

## 2020-08-18 DIAGNOSIS — N400 Enlarged prostate without lower urinary tract symptoms: Secondary | ICD-10-CM | POA: Diagnosis present

## 2020-08-18 DIAGNOSIS — Z951 Presence of aortocoronary bypass graft: Secondary | ICD-10-CM | POA: Diagnosis not present

## 2020-08-18 DIAGNOSIS — Z8249 Family history of ischemic heart disease and other diseases of the circulatory system: Secondary | ICD-10-CM

## 2020-08-18 DIAGNOSIS — Z794 Long term (current) use of insulin: Secondary | ICD-10-CM

## 2020-08-18 DIAGNOSIS — R0789 Other chest pain: Secondary | ICD-10-CM | POA: Diagnosis not present

## 2020-08-18 DIAGNOSIS — Z452 Encounter for adjustment and management of vascular access device: Secondary | ICD-10-CM | POA: Diagnosis not present

## 2020-08-18 DIAGNOSIS — Z792 Long term (current) use of antibiotics: Secondary | ICD-10-CM | POA: Diagnosis not present

## 2020-08-18 DIAGNOSIS — I083 Combined rheumatic disorders of mitral, aortic and tricuspid valves: Secondary | ICD-10-CM | POA: Diagnosis not present

## 2020-08-18 DIAGNOSIS — Z955 Presence of coronary angioplasty implant and graft: Secondary | ICD-10-CM

## 2020-08-18 DIAGNOSIS — K5900 Constipation, unspecified: Secondary | ICD-10-CM | POA: Diagnosis not present

## 2020-08-18 DIAGNOSIS — I517 Cardiomegaly: Secondary | ICD-10-CM | POA: Diagnosis not present

## 2020-08-18 LAB — CBC WITH DIFFERENTIAL/PLATELET
Abs Immature Granulocytes: 0.04 10*3/uL (ref 0.00–0.07)
Basophils Absolute: 0 10*3/uL (ref 0.0–0.1)
Basophils Relative: 0 %
Eosinophils Absolute: 0.1 10*3/uL (ref 0.0–0.5)
Eosinophils Relative: 2 %
HCT: 43.4 % (ref 39.0–52.0)
Hemoglobin: 13.5 g/dL (ref 13.0–17.0)
Immature Granulocytes: 1 %
Lymphocytes Relative: 25 %
Lymphs Abs: 1.6 10*3/uL (ref 0.7–4.0)
MCH: 27.6 pg (ref 26.0–34.0)
MCHC: 31.1 g/dL (ref 30.0–36.0)
MCV: 88.8 fL (ref 80.0–100.0)
Monocytes Absolute: 0.8 10*3/uL (ref 0.1–1.0)
Monocytes Relative: 12 %
Neutro Abs: 3.9 10*3/uL (ref 1.7–7.7)
Neutrophils Relative %: 60 %
Platelets: 271 10*3/uL (ref 150–400)
RBC: 4.89 MIL/uL (ref 4.22–5.81)
RDW: 14.9 % (ref 11.5–15.5)
WBC: 6.4 10*3/uL (ref 4.0–10.5)
nRBC: 0 % (ref 0.0–0.2)

## 2020-08-18 LAB — RESP PANEL BY RT-PCR (FLU A&B, COVID) ARPGX2
Influenza A by PCR: NEGATIVE
Influenza B by PCR: NEGATIVE
SARS Coronavirus 2 by RT PCR: NEGATIVE

## 2020-08-18 NOTE — ED Provider Notes (Signed)
[facility] EMERGENCY DEPARTMENT Provider Note   CSN: [id number] Arrival date & time: 08/18/20  2129     History Chief Complaint  Patient presents with  . Chest Pain    Chest pain pain to anterior chest, and L side of chest for approx. 3 hours prior to arrival to ER. Pt took 3 ntg, and 361m ASAa prior to coming to the ER. Pt states pain of a 3 now to the chest. Pt is in no acute distress,and resting comfortably     [name] SHARMANis a 65y.o. male.  HPI  Patient presents with chest pain.  Started at home while he was sitting in a chair.  At first he thought it was his position so he changed positions but it persisted.  It felt like a stabbing sensation that moved from his back through his front.  He has had pain similar to this in the past.  He took 3 nitroglycerin and each time he took a nitroglycerin and improved his pain but on the third nitroglycerin it resolved it.  He took 325 mg of aspirin and came to the hospital.  He was feeling normal prior to this and does not have any other symptoms.  He did not have any nausea or diaphoresis with this chest pain.  No shortness of breath, lightheadedness, or felt like he was going to pass out.  He has had several cast in the past year because he has had quickly progressing extensive multivessel disease and is scheduled for a CABG in 3 days.  Continues to take his Plavix and is compliant with his medications.     Past Medical History:  Diagnosis Date  . Anxiety   . Coronary atherosclerosis of native coronary artery    DES distal circumflex 2005; DES LAD/diagonal bifurcation 09/2010; DES ostial LAD and DES left PL 01/2020  . Essential hypertension   . GERD (gastroesophageal reflux disease)   . History of kidney stones   . Mixed hyperlipidemia   . [place] ([place]    Anterolateral with VF arrest 7/12  . Type 2 diabetes mellitus ([facility]     Patient Active Problem List   Diagnosis Date Noted  . NSTEMI (non-ST  elevated myocardial infarction) ([place] 08/19/2020  . CAD (coronary artery disease) 08/15/2020  . Accelerating angina ([place]   . Diabetes mellitus ([place]   . Coronary artery disease involving native coronary artery of native heart with unstable angina pectoris ([place] 02/07/2020  . Bleeds easily ([place] 07/20/2019  . BMI 36.0-36.9,adult 07/19/2019  . Benign prostatic hyperplasia with urinary hesitancy 07/19/2019  . Gastroesophageal reflux disease without esophagitis 07/19/2019  . 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 Old peripheral tear of lateral meniscus of left knee   . Acquired trigger finger 02/15/2015  . Coronary atherosclerosis of native coronary artery 10/23/2010  . Essential hypertension, benign 10/23/2010  . Hyperlipidemia with target LDL less than 70 10/23/2010    Past Surgical History:  Procedure Laterality Date  . CARDIAC CATHETERIZATION  2012  . CAROTID STENT     stents x 2   . CHOLECYSTECTOMY N/A 12/04/2015   Procedure: LAPAROSCOPIC CHOLECYSTECTOMY;  Surgeon: MAviva Signs MD;  Location: [facility];  Service: General;  Laterality: N/A;  . CHOLECYSTECTOMY, LAPAROSCOPIC  12/05/2015  . CORONARY ANGIOPLASTY  2012   STENT X 1 2012, STENT X 1 YRS BEFORE  . CORONARY ATHERECTOMY N/A 02/09/2020   Procedure: CORONARY ATHERECTOMY;  Surgeon: AWellington Hampshire  MD;  Location: [place] [facility];  Service: Cardiovascular;  Laterality: N/A;  . CORONARY STENT INTERVENTION N/A 02/09/2020   Procedure: CORONARY STENT INTERVENTION;  Surgeon: Wellington Hampshire, MD;  Location: [place] [facility];  Service: Cardiovascular;  Laterality: N/A;  . CORONARY STENT INTERVENTION N/A 05/22/2020   Procedure: CORONARY STENT INTERVENTION;  Surgeon: Jettie Booze, MD;  Location: [place] [facility];  Service: Cardiovascular;  Laterality: N/A;  . HYDROCELE EXCISION Bilateral 06/02/2017   Procedure: HYDROCELECTOMY ADULT;  Surgeon: Ceasar Mons, MD;  Location: [facility];  Service: Urology;   Laterality: Bilateral;  ONLY NEEDS 45 MIN  . INGUINAL HERNIA REPAIR     RIGHT GROIN  . INTRAVASCULAR IMAGING/OCT N/A 05/22/2020   Procedure: INTRAVASCULAR IMAGING/OCT;  Surgeon: Jettie Booze, MD;  Location: [place] [facility];  Service: Cardiovascular;  Laterality: N/A;  . INTRAVASCULAR ULTRASOUND/IVUS N/A 02/09/2020   Procedure: Intravascular Ultrasound/IVUS;  Surgeon: Wellington Hampshire, MD;  Location: [place] [facility];  Service: Cardiovascular;  Laterality: N/A;  . KNEE ARTHROSCOPY Left 05/23/2019   Procedure: LEFT KNEE ARTHROSCOPY AND DEBRIDEMENT PARTIAL MEDIAL MENISECTOMY;  Surgeon: Newt Minion, MD;  Location: [place];  Service: Orthopedics;  Laterality: Left;  . LEFT HEART CATH AND CORONARY ANGIOGRAPHY N/A 02/07/2020   Procedure: LEFT HEART CATH AND CORONARY ANGIOGRAPHY;  Surgeon: Leonie Man, MD;  Location: [place] [facility];  Service: Cardiovascular;  Laterality: N/A;  . LEFT HEART CATH AND CORONARY ANGIOGRAPHY N/A 05/22/2020   Procedure: LEFT HEART CATH AND CORONARY ANGIOGRAPHY;  Surgeon: Jettie Booze, MD;  Location: [place] [facility];  Service: Cardiovascular;  Laterality: N/A;  . LEFT HEART CATH AND CORONARY ANGIOGRAPHY N/A 08/15/2020   Procedure: LEFT HEART CATH AND CORONARY ANGIOGRAPHY;  Surgeon: Lorretta Harp, MD;  Location: [place] [facility];  Service: Cardiovascular;  Laterality: N/A;  . TRIGGER FINGER RELEASE Right 01/08/2015   Procedure: RELEASE TRIGGER FINGER/A-1 PULLEY RIGHT MIDDLE FINGER;  Surgeon: Daryll Brod, MD;  Location: [place];  Service: Orthopedics;  Laterality: Right;       Family History  Problem Relation Age of Onset  . Hyperlipidemia Mother   . Hypertension Mother   . Hyperlipidemia Father   . Hypertension Father   . Diabetes Father   . Dementia Father   . Diabetes Maternal Grandfather     Social History   Tobacco Use  . Smoking status: Never Smoker  . Smokeless tobacco: Never Used   Vaping Use  . Vaping Use: Never used  Substance Use Topics  . Alcohol use: No  . Drug use: No    Home Medications Prior to Admission medications   Medication Sig Start Date End Date Taking? Authorizing Provider  aspirin 81 MG tablet Take 81 mg by mouth daily.   Yes [provider]  atorvastatin (LIPITOR) 80 MG tablet Take 1 tablet (80 mg total) by mouth daily. 08/05/20  Yes Martin, Mary-Margaret, FNP  carvedilol (COREG) 12.5 MG tablet Take 1.5 tablets (18.75 mg total) by mouth 2 (two) times daily. 08/05/20 11/03/20 Yes Martin, Mary-Margaret, FNP  cetirizine (ZYRTEC) 10 MG tablet Take 10 mg by mouth daily.   Yes [provider]  Cinnamon 500 MG TABS Take 1,000 mg by mouth 2 (two) times daily.    Yes [provider]  CRANBERRY PO Take 4,200 mg by mouth 2 (two) times daily.    Yes [provider]  ibuprofen (ADVIL) 200 MG tablet Take 600-800  mg by mouth every 6 (six) hours as needed for fever, headache or mild pain.   Yes [provider]  Insulin Aspart FlexPen 100 UNIT/ML SOPN INJECT UP TO 45 UNITS EVERY DAY Patient taking differently: Inject 45-75 Units into the skin 2 (two) times daily with a meal. INJECT 45-75 units subq with breakfast and supper. Sliding scale 02/16/20  Yes Hassell Done, Mary-Margaret, FNP  insulin glargine (LANTUS SOLOSTAR) 100 UNIT/ML Solostar Pen 44u in morning and 75 at night Patient taking differently: Inject 45-75 Units into the skin See admin instructions. 45 units in morning and 75 at night 08/05/20  Yes Hassell Done, Mary-Margaret, FNP  isosorbide mononitrate (IMDUR) 60 MG 24 hr tablet Take 1 tablet (60 mg total) by mouth daily. 08/06/20  Yes Verta Ellen., NP  lisinopril-hydrochlorothiazide (ZESTORETIC) 20-12.5 MG tablet Take 1 tablet by mouth daily. 08/05/20  Yes Martin, Mary-Margaret, FNP  metFORMIN (GLUCOPHAGE) 500 MG tablet TAKE 2 TABLETS BY MOUTHTWICE DAILY. Patient taking differently: Take 1,000 mg by mouth 2 (two) times daily  with a meal. TAKE 2 TABLETS BY MOUTHTWICE DAILY. 08/05/20  Yes Hassell Done, Mary-Margaret, FNP  nitroGLYCERIN (NITROSTAT) 0.4 MG SL tablet Place 1 tablet (0.4 mg total) under the tongue every 5 (five) minutes as needed for chest pain. 05/16/20  Yes Satira Sark, MD  pantoprazole (PROTONIX) 40 MG tablet Take 1 tablet (40 mg total) by mouth daily as needed (for acid reflux). 08/05/20  Yes Martin, Mary-Margaret, FNP  Semaglutide,0.25 or 0.5MG/DOS, (OZEMPIC, 0.25 OR 0.5 MG/DOSE,) 2 MG/1.5ML SOPN INJECT 0.375 MLS (0.5 MG TOTAL) INTO THE SKIN ONCE A WEEK. Patient taking differently: Inject 0.5 mg into the skin every Thursday. INJECT 0.375 MLS (0.5 MG TOTAL) INTO THE SKIN ONCE A WEEK. 08/05/20  Yes Hassell Done, Mary-Margaret, FNP  tamsulosin (FLOMAX) 0.4 MG CAPS capsule Take 1 capsule (0.4 mg total) by mouth at bedtime. 08/05/20  Yes Hassell Done, Mary-Margaret, FNP  Accu-Chek Softclix Lancets lancets USE TO CHECK BLOOD SUGAR UP TO 4 TIMES DAILY. 09/14/19   Chevis Pretty, FNP  blood glucose meter kit and supplies Dispense based on patient and insurance preference. Use up to four times daily as directed. (FOR ICD-10 E10.9, E11.9). 04/26/19   Hassell Done Mary-Margaret, FNP  GLOBAL EASE INJECT PEN NEEDLES 31G X 8 MM MISC Use up to 8 times a day Dx E11.8 02/16/20   Hassell Done Mary-Margaret, FNP  glucose blood (ACCU-CHEK AVIVA PLUS) test strip Check BS up to 4 times a day dx E11.8 06/13/20   Chevis Pretty, FNP    Allergies    Patient has no known allergies.  Review of Systems   Review of Systems  Constitutional: Negative for chills and fever.  HENT: Negative for ear pain and sore throat.   Eyes: Negative for pain and visual disturbance.  Respiratory: Negative for cough and shortness of breath.   Cardiovascular: Positive for chest pain. Negative for palpitations.  Gastrointestinal: Negative for abdominal pain and vomiting.  Genitourinary: Negative for dysuria and hematuria.  Musculoskeletal: Negative for arthralgias  and back pain.  Skin: Negative for color change and rash.  Neurological: Negative for seizures and syncope.  All other systems reviewed and are negative.   Physical Exam Updated Vital Signs BP 129/74 (BP Location: Right Arm)   Pulse 71   Temp 98.6 F (37 C) (Oral)   Resp 19   Ht '5\' 9"'  (1.753 m)   Wt 108.4 kg   SpO2 98%   BMI 35.29 kg/m   Physical Exam Vitals and nursing note  reviewed.  Constitutional:      Appearance: He is well-developed.  HENT:     Head: Normocephalic and atraumatic.  Eyes:     Conjunctiva/sclera: Conjunctivae normal.  Cardiovascular:     Rate and Rhythm: Normal rate and regular rhythm.     Heart sounds: No murmur heard.   Pulmonary:     Effort: Pulmonary effort is normal. No respiratory distress.     Breath sounds: Normal breath sounds.  Abdominal:     Palpations: Abdomen is soft.     Tenderness: There is no abdominal tenderness.  Musculoskeletal:     Cervical back: Neck supple.  Skin:    General: Skin is warm and dry.  Neurological:     General: No focal deficit present.     Mental Status: He is alert.  Psychiatric:        Behavior: Behavior normal. Behavior is not agitated.     ED Results / Procedures / Treatments   Labs (all labs ordered are listed, but only abnormal results are displayed) Labs Reviewed  BASIC METABOLIC PANEL - Abnormal; Notable for the following components:      Result Value   Chloride 97 (*)    Glucose, Bld 320 (*)    All other components within normal limits  GLUCOSE, CAPILLARY - Abnormal; Notable for the following components:   Glucose-Capillary 191 (*)    All other components within normal limits  GLUCOSE, CAPILLARY - Abnormal; Notable for the following components:   Glucose-Capillary 179 (*)    All other components within normal limits  GLUCOSE, CAPILLARY - Abnormal; Notable for the following components:   Glucose-Capillary 262 (*)    All other components within normal limits  GLUCOSE, CAPILLARY -  Abnormal; Notable for the following components:   Glucose-Capillary 183 (*)    All other components within normal limits  TROPONIN I (HIGH SENSITIVITY) - Abnormal; Notable for the following components:   Troponin I (High Sensitivity) 48 (*)    All other components within normal limits  TROPONIN I (HIGH SENSITIVITY) - Abnormal; Notable for the following components:   Troponin I (High Sensitivity) 1,079 (*)    All other components within normal limits  RESP PANEL BY RT-PCR (FLU A&B, COVID) ARPGX2  CBC WITH DIFFERENTIAL/PLATELET  HEPARIN LEVEL (UNFRACTIONATED)  HIV ANTIBODY (ROUTINE TESTING W REFLEX)  HEPARIN LEVEL (UNFRACTIONATED)  CBC  BASIC METABOLIC PANEL    EKG EKG Interpretation  Date/Time:  Sunday Aug 18 2020 21:38:24 EDT Ventricular Rate:  93 PR Interval:  212 QRS Duration: 86 QT Interval:  346 QTC Calculation: 431 R Axis:   74 Text Interpretation: Sinus rhythm Borderline prolonged PR interval Anteroseptal infarct, old Confirmed by Sherwood Gambler [phone]) on 08/18/2020 10:11:15 PM   Radiology DG Chest 2 View  Result Date: 08/18/2020 CLINICAL DATA:  Chest pain EXAM: CHEST - 2 VIEW COMPARISON:  10/01/2010 FINDINGS: The heart size and mediastinal contours are within normal limits. Both lungs are clear. The visualized skeletal structures are unremarkable. IMPRESSION: No active cardiopulmonary disease. Electronically Signed   By: Ulyses Jarred M.D.   On: 08/18/2020 22:52    Procedures Procedures   Medications Ordered in ED Medications  heparin ADULT infusion 100 units/mL (25000 units/255m) (1,800 Units/hr Intravenous New Bag/Given 08/19/20 1509)  sodium chloride flush (NS) 0.9 % injection 3 mL (3 mLs Intravenous Given 08/19/20 2205)  tamsulosin (FLOMAX) capsule 0.4 mg (0.4 mg Oral Given 08/19/20 2204)  pantoprazole (PROTONIX) EC tablet 40 mg (has no administration in time range)  atorvastatin (LIPITOR) tablet 80 mg (80 mg Oral Given 08/19/20 0635)  insulin glargine (LANTUS)  injection 44 Units (44 Units Subcutaneous Given 08/19/20 1106)  metFORMIN (GLUCOPHAGE) tablet 1,000 mg (1,000 mg Oral Given 08/19/20 1659)  carvedilol (COREG) tablet 18.75 mg (18.75 mg Oral Given 08/19/20 2204)  isosorbide mononitrate (IMDUR) 24 hr tablet 60 mg (60 mg Oral Given 08/19/20 1105)  aspirin EC tablet 81 mg (has no administration in time range)  nitroGLYCERIN (NITROSTAT) SL tablet 0.4 mg (has no administration in time range)  acetaminophen (TYLENOL) tablet 650 mg (has no administration in time range)  ondansetron (ZOFRAN) injection 4 mg (has no administration in time range)  insulin aspart (novoLOG) injection 0-20 Units (11 Units Subcutaneous Given 08/19/20 1659)  lisinopril (ZESTRIL) tablet 20 mg (20 mg Oral Given 08/19/20 1105)    And  hydrochlorothiazide (MICROZIDE) capsule 12.5 mg (12.5 mg Oral Given 08/19/20 1105)  aspirin chewable tablet 324 mg (324 mg Oral Given 08/19/20 0141)  heparin bolus via infusion 4,000 Units (4,000 Units Intravenous Bolus from Bag 08/19/20 0310)    ED Course  I have reviewed the triage vital signs and the nursing notes.  Pertinent labs & imaging results that were available during my care of the patient were reviewed by me and considered in my medical decision making (see chart for details).    MDM Rules/Calculators/A&P                          Patient presents with chest pain.  Symptoms are concerning in the context of extensive multivessel disease.  No findings consistent with pulmonary embolus.  Vital signs are stable. Asymptomatic on my eval. Doubt dissection with his resolution with NTG, similar symptoms to previous, not tearing pain. Will obtain ACS workup.  EKG does not show any focal ischemic changes, conduction delays, sinus rhythm. Chest x-ray shows no focal consolidations, no pulmonary interstitial edema, no mediastinal widening. Laboratory studies were pending at time of handoff to Dr. Roxanne Mins.    Final Clinical Impression(s) / ED  Diagnoses Final diagnoses:  Acute coronary syndrome [facility])    Rx / DC Orders ED Discharge Orders    None       Aris Lot, MD 08/19/20 9539    Sherwood Gambler, MD 08/21/20 1459

## 2020-08-19 ENCOUNTER — Encounter (HOSPITAL_COMMUNITY): Payer: Self-pay | Admitting: Student in an Organized Health Care Education/Training Program

## 2020-08-19 ENCOUNTER — Encounter (HOSPITAL_COMMUNITY): Payer: Medicare PPO

## 2020-08-19 ENCOUNTER — Other Ambulatory Visit: Payer: Self-pay | Admitting: *Deleted

## 2020-08-19 DIAGNOSIS — I25110 Atherosclerotic heart disease of native coronary artery with unstable angina pectoris: Secondary | ICD-10-CM | POA: Diagnosis present

## 2020-08-19 DIAGNOSIS — Z8249 Family history of ischemic heart disease and other diseases of the circulatory system: Secondary | ICD-10-CM | POA: Diagnosis not present

## 2020-08-19 DIAGNOSIS — J9811 Atelectasis: Secondary | ICD-10-CM | POA: Diagnosis not present

## 2020-08-19 DIAGNOSIS — D62 Acute posthemorrhagic anemia: Secondary | ICD-10-CM | POA: Diagnosis not present

## 2020-08-19 DIAGNOSIS — E782 Mixed hyperlipidemia: Secondary | ICD-10-CM | POA: Diagnosis present

## 2020-08-19 DIAGNOSIS — R079 Chest pain, unspecified: Secondary | ICD-10-CM | POA: Diagnosis present

## 2020-08-19 DIAGNOSIS — E8770 Fluid overload, unspecified: Secondary | ICD-10-CM | POA: Diagnosis not present

## 2020-08-19 DIAGNOSIS — I214 Non-ST elevation (NSTEMI) myocardial infarction: Secondary | ICD-10-CM | POA: Diagnosis present

## 2020-08-19 DIAGNOSIS — N400 Enlarged prostate without lower urinary tract symptoms: Secondary | ICD-10-CM | POA: Diagnosis present

## 2020-08-19 DIAGNOSIS — N401 Enlarged prostate with lower urinary tract symptoms: Secondary | ICD-10-CM | POA: Diagnosis present

## 2020-08-19 DIAGNOSIS — Z6836 Body mass index (BMI) 36.0-36.9, adult: Secondary | ICD-10-CM | POA: Diagnosis not present

## 2020-08-19 DIAGNOSIS — I252 Old myocardial infarction: Secondary | ICD-10-CM | POA: Diagnosis present

## 2020-08-19 DIAGNOSIS — J90 Pleural effusion, not elsewhere classified: Secondary | ICD-10-CM | POA: Diagnosis not present

## 2020-08-19 DIAGNOSIS — Z8674 Personal history of sudden cardiac arrest: Secondary | ICD-10-CM | POA: Diagnosis not present

## 2020-08-19 DIAGNOSIS — Z83438 Family history of other disorder of lipoprotein metabolism and other lipidemia: Secondary | ICD-10-CM | POA: Diagnosis not present

## 2020-08-19 DIAGNOSIS — R3911 Hesitancy of micturition: Secondary | ICD-10-CM | POA: Diagnosis present

## 2020-08-19 DIAGNOSIS — I440 Atrioventricular block, first degree: Secondary | ICD-10-CM | POA: Diagnosis present

## 2020-08-19 DIAGNOSIS — E785 Hyperlipidemia, unspecified: Secondary | ICD-10-CM | POA: Diagnosis not present

## 2020-08-19 DIAGNOSIS — K219 Gastro-esophageal reflux disease without esophagitis: Secondary | ICD-10-CM | POA: Diagnosis present

## 2020-08-19 DIAGNOSIS — I10 Essential (primary) hypertension: Secondary | ICD-10-CM | POA: Diagnosis present

## 2020-08-19 DIAGNOSIS — K5900 Constipation, unspecified: Secondary | ICD-10-CM | POA: Diagnosis not present

## 2020-08-19 DIAGNOSIS — R001 Bradycardia, unspecified: Secondary | ICD-10-CM | POA: Diagnosis not present

## 2020-08-19 DIAGNOSIS — E1169 Type 2 diabetes mellitus with other specified complication: Secondary | ICD-10-CM | POA: Diagnosis present

## 2020-08-19 DIAGNOSIS — Z20822 Contact with and (suspected) exposure to covid-19: Secondary | ICD-10-CM | POA: Diagnosis present

## 2020-08-19 DIAGNOSIS — E119 Type 2 diabetes mellitus without complications: Secondary | ICD-10-CM | POA: Diagnosis not present

## 2020-08-19 DIAGNOSIS — Z955 Presence of coronary angioplasty implant and graft: Secondary | ICD-10-CM | POA: Diagnosis not present

## 2020-08-19 DIAGNOSIS — E669 Obesity, unspecified: Secondary | ICD-10-CM | POA: Diagnosis present

## 2020-08-19 LAB — GLUCOSE, CAPILLARY
Glucose-Capillary: 191 mg/dL — ABNORMAL HIGH (ref 70–99)
Glucose-Capillary: 179 mg/dL — ABNORMAL HIGH (ref 70–99)
Glucose-Capillary: 262 mg/dL — ABNORMAL HIGH (ref 70–99)
Glucose-Capillary: 183 mg/dL — ABNORMAL HIGH (ref 70–99)

## 2020-08-19 LAB — BASIC METABOLIC PANEL
Anion gap: 8 (ref 5–15)
BUN: 20 mg/dL (ref 8–23)
CO2: 30 mmol/L (ref 22–32)
Calcium: 8.9 mg/dL (ref 8.9–10.3)
Chloride: 97 mmol/L — ABNORMAL LOW (ref 98–111)
Creatinine, Ser: 1.15 mg/dL (ref 0.61–1.24)
GFR, Estimated: >60 mL/min (ref >60)
Glucose, Bld: 320 mg/dL — ABNORMAL HIGH (ref 70–99)
Potassium: 4.3 mmol/L (ref 3.5–5.1)
Sodium: 135 mmol/L (ref 135–145)

## 2020-08-19 LAB — TROPONIN I (HIGH SENSITIVITY)
Troponin I (High Sensitivity): 1079 ng/L (ref <18)
Troponin I (High Sensitivity): 48 ng/L — ABNORMAL HIGH (ref <18)

## 2020-08-19 LAB — HEPARIN LEVEL (UNFRACTIONATED): Heparin Unfractionated: 0.51 IU/mL (ref 0.30–0.70)

## 2020-08-19 LAB — HIV ANTIBODY (ROUTINE TESTING W REFLEX): HIV Screen 4th Generation wRfx: NONREACTIVE

## 2020-08-19 MED ORDER — PANTOPRAZOLE SODIUM 40 MG PO TBEC: 40.0000 mg | DELAYED_RELEASE_TABLET | Freq: Every day | ORAL | Status: DC | PRN

## 2020-08-19 MED ORDER — ASPIRIN 81 MG PO CHEW
324.0000 mg | CHEWABLE_TABLET | Freq: Once | ORAL | Status: AC
  Administered 2020-08-19: 324 mg via ORAL
  Filled 2020-08-19: qty 4

## 2020-08-19 MED ORDER — CARVEDILOL 6.25 MG PO TABS
18.7500 mg | ORAL_TABLET | Freq: Two times a day (BID) | ORAL | Status: DC
  Administered 2020-08-19 – 2020-08-20 (×4): 18.75 mg via ORAL
  Filled 2020-08-19 (×4): qty 1

## 2020-08-19 MED ORDER — HEPARIN BOLUS VIA INFUSION
4000.0000 [IU] | Freq: Once | INTRAVENOUS | Status: AC
  Administered 2020-08-19: 4000 [IU] via INTRAVENOUS
  Filled 2020-08-19: qty 4000

## 2020-08-19 MED ORDER — ATORVASTATIN CALCIUM 80 MG PO TABS
80.0000 mg | ORAL_TABLET | Freq: Every day | ORAL | Status: DC
  Administered 2020-08-19 – 2020-08-20 (×2): 80 mg via ORAL
  Filled 2020-08-19 (×3): qty 1

## 2020-08-19 MED ORDER — LISINOPRIL 20 MG PO TABS
20.0000 mg | ORAL_TABLET | Freq: Every day | ORAL | Status: DC
  Administered 2020-08-19 – 2020-08-20 (×2): 20 mg via ORAL
  Filled 2020-08-19 (×2): qty 1

## 2020-08-19 MED ORDER — INSULIN ASPART 100 UNIT/ML IJ SOLN
0.0000 [IU] | Freq: Three times a day (TID) | INTRAMUSCULAR | Status: DC
  Administered 2020-08-19: 4 [IU] via SUBCUTANEOUS
  Administered 2020-08-19: 11 [IU] via SUBCUTANEOUS
  Administered 2020-08-19: 4 [IU] via SUBCUTANEOUS
  Administered 2020-08-20: 7 [IU] via SUBCUTANEOUS
  Administered 2020-08-20: 11 [IU] via SUBCUTANEOUS
  Administered 2020-08-20: 7 [IU] via SUBCUTANEOUS

## 2020-08-19 MED ORDER — ASPIRIN EC 81 MG PO TBEC
81.0000 mg | DELAYED_RELEASE_TABLET | Freq: Every day | ORAL | Status: DC
  Administered 2020-08-20: 81 mg via ORAL
  Filled 2020-08-19: qty 1

## 2020-08-19 MED ORDER — ASPIRIN 81 MG PO TABS: 81.0000 mg | ORAL_TABLET | Freq: Every day | ORAL | Status: DC

## 2020-08-19 MED ORDER — INSULIN GLARGINE 100 UNIT/ML ~~LOC~~ SOLN
44.0000 [IU] | Freq: Every day | SUBCUTANEOUS | Status: DC
  Administered 2020-08-19 – 2020-08-20 (×2): 44 [IU] via SUBCUTANEOUS
  Filled 2020-08-19 (×3): qty 0.44

## 2020-08-19 MED ORDER — HEPARIN (PORCINE) 25000 UT/250ML-% IV SOLN
1800.0000 [IU]/h | INTRAVENOUS | Status: DC
  Administered 2020-08-19 – 2020-08-20 (×4): 1800 [IU]/h via INTRAVENOUS
  Filled 2020-08-19 (×5): qty 250

## 2020-08-19 MED ORDER — METFORMIN HCL 500 MG PO TABS
1000.0000 mg | ORAL_TABLET | Freq: Two times a day (BID) | ORAL | Status: DC
  Administered 2020-08-19 – 2020-08-20 (×3): 1000 mg via ORAL
  Filled 2020-08-19 (×3): qty 2

## 2020-08-19 MED ORDER — ACETAMINOPHEN 325 MG PO TABS: 650.0000 mg | ORAL_TABLET | ORAL | Status: DC | PRN

## 2020-08-19 MED ORDER — ISOSORBIDE MONONITRATE ER 60 MG PO TB24
60.0000 mg | ORAL_TABLET | Freq: Every day | ORAL | Status: DC
  Administered 2020-08-19 – 2020-08-20 (×2): 60 mg via ORAL
  Filled 2020-08-19 (×2): qty 1

## 2020-08-19 MED ORDER — HYDROCHLOROTHIAZIDE 12.5 MG PO CAPS
12.5000 mg | ORAL_CAPSULE | Freq: Every day | ORAL | Status: DC
  Administered 2020-08-19 – 2020-08-20 (×2): 12.5 mg via ORAL
  Filled 2020-08-19 (×2): qty 1

## 2020-08-19 MED ORDER — NITROGLYCERIN 0.4 MG SL SUBL: 0.4000 mg | SUBLINGUAL_TABLET | SUBLINGUAL | Status: DC | PRN

## 2020-08-19 MED ORDER — LISINOPRIL-HYDROCHLOROTHIAZIDE 20-12.5 MG PO TABS: 1.0000 | ORAL_TABLET | Freq: Every day | ORAL | Status: DC

## 2020-08-19 MED ORDER — ONDANSETRON HCL 4 MG/2ML IJ SOLN: 4.0000 mg | Freq: Four times a day (QID) | INTRAMUSCULAR | Status: DC | PRN

## 2020-08-19 MED ORDER — TAMSULOSIN HCL 0.4 MG PO CAPS
0.4000 mg | ORAL_CAPSULE | Freq: Every day | ORAL | Status: DC
  Administered 2020-08-19 – 2020-08-20 (×2): 0.4 mg via ORAL
  Filled 2020-08-19 (×2): qty 1

## 2020-08-19 MED ORDER — SODIUM CHLORIDE 0.9% FLUSH
3.0000 mL | Freq: Two times a day (BID) | INTRAVENOUS | Status: DC
  Administered 2020-08-19 – 2020-08-20 (×4): 3 mL via INTRAVENOUS

## 2020-08-19 MED ORDER — SEMAGLUTIDE(0.25 OR 0.5MG/DOS) 2 MG/1.5ML ~~LOC~~ SOPN: 0.5000 mg | PEN_INJECTOR | SUBCUTANEOUS | Status: DC

## 2020-08-19 NOTE — Progress Notes (Signed)
ANTICOAGULATION CONSULT NOTE  Pharmacy Consult for Heparin Indication: chest pain/ACS  No Known Allergies  Patient Measurements: Height: 5\' 9"  (175.3 cm) Weight: 108.4 kg (238 lb 15.7 oz) IBW/kg (Calculated) : 70.7 Heparin Dosing Weight: 94 kg  Vital Signs: Temp: 98.7 F (37.1 C) (05/23 0719) Temp Source: Oral (05/23 0719) BP: 133/74 (05/23 0719) Pulse Rate: 69 (05/23 0719)  Labs: Recent Labs    08/18/20 2139 08/19/20 0027 08/19/20 0858  HGB 13.5  --   --   HCT 43.4  --   --   PLT 271  --   --   HEPARINUNFRC  --   --  0.51  CREATININE 1.15  --   --   TROPONINIHS 48* 1,079*  --     Estimated Creatinine Clearance: 75.6 mL/min (by C-G formula based on SCr of 1.15 mg/dL).   Medical History: Past Medical History:  Diagnosis Date  . Anxiety   . Coronary atherosclerosis of native coronary artery    DES distal circumflex 2005; DES LAD/diagonal bifurcation 09/2010; DES ostial LAD and DES left PL 01/2020  . Essential hypertension   . GERD (gastroesophageal reflux disease)   . History of kidney stones   . Mixed hyperlipidemia   . Myocardial infarction ([place])    Anterolateral with VF arrest 7/12  . Type 2 diabetes mellitus ([facility])     Medications:  Awaiting home med rec  Assessment: 65 y.o. M presents with CP. Pt with known 3v CAD and scheduled for CABG on 5/25. To begin heparin per pharmacy. CBC ok on admission. No AC PTA.  Heparin level is therapeutic at 0.51.  Goal of Therapy:  Heparin level 0.3-0.7 units/ml Monitor platelets by anticoagulation protocol: Yes   Plan:  Continue heparin 1800 units/h Daily heparin level and CBC   Arrie Senate, PharmD, [place], [facility] Clinical Pharmacist [phone] Please check AMION for all [place] numbers 08/19/2020

## 2020-08-19 NOTE — H&P (Signed)
Cardiology Admission History and Physical:   Patient ID: [name] [name] MRN: [id number]; DOB: 10-Jun-1955   Admission date: 08/18/2020  Primary Care Provider: Chevis Pretty, [facility] Cardiologist: Rozann Lesches, MD  [facility] Electrophysiologist:  None   Chief Complaint: chest pain  Patient Profile:   [name] [name] is a 65 y.o. male with CAD s/p multiple PCI (most recent 04/2020) with ISR, HTN, GERD, HLD, prior VF arrest, and DM2 who presents with recurrent CP.   History of Present Illness:   [name] [name] was just recently hospitalized and discharged home on Friday following admission for a similar episode of angina for which he was found to have recurrent ISR of recent PCI to his ostial/pLAD in 04/2020.  It has been recommended that he stays in inpatient awaiting CABG on 05/25 however [name] [name] was otherwise chest pain-free and wanted to try to go home prior to his surgery so he was discharged Friday.  Yesterday evening around 6 PM he was sitting watching TV and had acute onset fairly severe anginal chest pain with substernal chest pain 7/10 severity with radiation through to his back and left side.  He took 1 sublingual nitroglycerin with improvement of his pain temporarily followed by recurrence shortly thereafter.  This happened with 2 additional sublingual nitroglycerin at which point he called EMS for transport to the hospital.  By the time he got to the hospital he was chest pain-free again.  This episode is identical to his prior anginal equivalents.  He took 4 baby aspirin at home and was started on heparin in the ED.  During my evaluation he was comfortable and without any chest discomfort and not on nitrates other than his long-acting Imdur.  Past Medical History:  Diagnosis Date  . Anxiety   . Coronary atherosclerosis of native coronary artery    DES distal circumflex 2005; DES LAD/diagonal bifurcation 09/2010; DES ostial LAD and DES left PL 01/2020  .  Essential hypertension   . GERD (gastroesophageal reflux disease)   . History of kidney stones   . Mixed hyperlipidemia   . Myocardial infarction ([place])    Anterolateral with VF arrest 7/12  . Type 2 diabetes mellitus ([place])    Past Surgical History:  Procedure Laterality Date  . CARDIAC CATHETERIZATION  2012  . CAROTID STENT     stents x 2   . CHOLECYSTECTOMY N/A 12/04/2015   Procedure: LAPAROSCOPIC CHOLECYSTECTOMY;  Surgeon: Aviva Signs, MD;  Location: [facility];  Service: General;  Laterality: N/A;  . CHOLECYSTECTOMY, LAPAROSCOPIC  12/05/2015  . CORONARY ANGIOPLASTY  2012   STENT X 1 2012, STENT X 1 YRS BEFORE  . CORONARY ATHERECTOMY N/A 02/09/2020   Procedure: CORONARY ATHERECTOMY;  Surgeon: Wellington Hampshire, MD;  Location: [facility];  Service: Cardiovascular;  Laterality: N/A;  . CORONARY STENT INTERVENTION N/A 02/09/2020   Procedure: CORONARY STENT INTERVENTION;  Surgeon: Wellington Hampshire, MD;  Location: [facility];  Service: Cardiovascular;  Laterality: N/A;  . CORONARY STENT INTERVENTION N/A 05/22/2020   Procedure: CORONARY STENT INTERVENTION;  Surgeon: Jettie Booze, MD;  Location: [place];  Service: Cardiovascular;  Laterality: N/A;  . HYDROCELE EXCISION Bilateral 06/02/2017   Procedure: HYDROCELECTOMY ADULT;  Surgeon: Ceasar Mons, MD;  Location: [facility];  Service: Urology;  Laterality: Bilateral;  ONLY NEEDS 45 MIN  . INGUINAL HERNIA REPAIR     RIGHT GROIN  . INTRAVASCULAR IMAGING/OCT N/A 05/22/2020   Procedure: INTRAVASCULAR IMAGING/OCT;  Surgeon: Jettie Booze, MD;  Location: [place] [facility];  Service: Cardiovascular;  Laterality: N/A;  . INTRAVASCULAR ULTRASOUND/IVUS N/A 02/09/2020   Procedure: Intravascular Ultrasound/IVUS;  Surgeon: Wellington Hampshire, MD;  Location: [facility];  Service: Cardiovascular;  Laterality: N/A;  . KNEE ARTHROSCOPY Left 05/23/2019   Procedure: LEFT KNEE ARTHROSCOPY AND DEBRIDEMENT  PARTIAL MEDIAL MENISECTOMY;  Surgeon: Newt Minion, MD;  Location: [facility];  Service: Orthopedics;  Laterality: Left;  . LEFT HEART CATH AND CORONARY ANGIOGRAPHY N/A 02/07/2020   Procedure: LEFT HEART CATH AND CORONARY ANGIOGRAPHY;  Surgeon: Leonie Man, MD;  Location: [facility];  Service: Cardiovascular;  Laterality: N/A;  . LEFT HEART CATH AND CORONARY ANGIOGRAPHY N/A 05/22/2020   Procedure: LEFT HEART CATH AND CORONARY ANGIOGRAPHY;  Surgeon: Jettie Booze, MD;  Location: [facility];  Service: Cardiovascular;  Laterality: N/A;  . LEFT HEART CATH AND CORONARY ANGIOGRAPHY N/A 08/15/2020   Procedure: LEFT HEART CATH AND CORONARY ANGIOGRAPHY;  Surgeon: Lorretta Harp, MD;  Location: [place] [facility];  Service: Cardiovascular;  Laterality: N/A;  . TRIGGER FINGER RELEASE Right 01/08/2015   Procedure: RELEASE TRIGGER FINGER/A-1 PULLEY RIGHT MIDDLE FINGER;  Surgeon: Daryll Brod, MD;  Location: [facility];  Service: Orthopedics;  Laterality: Right;    Medications Prior to Admission: Prior to Admission medications   Medication Sig Start Date End Date Taking? Authorizing Provider  aspirin 81 MG tablet Take 81 mg by mouth daily.   Yes [provider]  atorvastatin (LIPITOR) 80 MG tablet Take 1 tablet (80 mg total) by mouth daily. 08/05/20  Yes Martin, Mary-Margaret, FNP  carvedilol (COREG) 12.5 MG tablet Take 1.5 tablets (18.75 mg total) by mouth 2 (two) times daily. 08/05/20 11/03/20 Yes Martin, Mary-Margaret, FNP  cetirizine (ZYRTEC) 10 MG tablet Take 10 mg by mouth daily.   Yes [provider]  Insulin Aspart FlexPen 100 UNIT/ML SOPN INJECT UP TO 45 UNITS EVERY DAY Patient taking differently: Inject 45-75 Units into the skin 2 (two) times daily with a meal. INJECT 45-75 units subq with breakfast and supper. Sliding scale 02/16/20  Yes Hassell Done, Mary-Margaret, FNP  insulin glargine (LANTUS SOLOSTAR) 100 UNIT/ML Solostar Pen 44u in  morning and 75 at night Patient taking differently: Inject 45-75 Units into the skin See admin instructions. 45 units in morning and 75 at night 08/05/20  Yes Hassell Done, Mary-Margaret, FNP  isosorbide mononitrate (IMDUR) 60 MG 24 hr tablet Take 1 tablet (60 mg total) by mouth daily. 08/06/20  Yes Verta Ellen., NP  lisinopril-hydrochlorothiazide (ZESTORETIC) 20-12.5 MG tablet Take 1 tablet by mouth daily. 08/05/20  Yes Martin, Mary-Margaret, FNP  metFORMIN (GLUCOPHAGE) 500 MG tablet TAKE 2 TABLETS BY MOUTHTWICE DAILY. Patient taking differently: Take 1,000 mg by mouth 2 (two) times daily with a meal. TAKE 2 TABLETS BY MOUTHTWICE DAILY. 08/05/20  Yes Hassell Done, Mary-Margaret, FNP  nitroGLYCERIN (NITROSTAT) 0.4 MG SL tablet Place 1 tablet (0.4 mg total) under the tongue every 5 (five) minutes as needed for chest pain. 05/16/20  Yes Satira Sark, MD  pantoprazole (PROTONIX) 40 MG tablet Take 1 tablet (40 mg total) by mouth daily as needed (for acid reflux). 08/05/20  Yes Martin, Mary-Margaret, FNP  Semaglutide,0.25 or 0.5MG/DOS, (OZEMPIC, 0.25 OR 0.5 MG/DOSE,) 2 MG/1.5ML SOPN INJECT 0.375 MLS (0.5 MG TOTAL) INTO THE SKIN ONCE A WEEK. Patient taking differently: Inject 0.5 mg into the skin every Thursday. INJECT 0.375 MLS (0.5 MG TOTAL)  INTO THE SKIN ONCE A WEEK. 08/05/20  Yes Hassell Done, Mary-Margaret, FNP  tamsulosin (FLOMAX) 0.4 MG CAPS capsule Take 1 capsule (0.4 mg total) by mouth at bedtime. 08/05/20  Yes Hassell Done, Mary-Margaret, FNP  Accu-Chek Softclix Lancets lancets USE TO CHECK BLOOD SUGAR UP TO 4 TIMES DAILY. 09/14/19   Chevis Pretty, FNP  blood glucose meter kit and supplies Dispense based on patient and insurance preference. Use up to four times daily as directed. (FOR ICD-10 E10.9, E11.9). 04/26/19   Hassell Done, Mary-Margaret, FNP  Cinnamon 500 MG TABS Take 1,000 mg by mouth 2 (two) times daily.     [provider]  CRANBERRY PO Take 4,200 mg by mouth 2 (two) times daily.     [provider]  GLOBAL EASE INJECT PEN NEEDLES 31G X 8 MM MISC Use up to 8 times a day Dx E11.8 02/16/20   Hassell Done, Mary-Margaret, FNP  glucose blood (ACCU-CHEK AVIVA PLUS) test strip Check BS up to 4 times a day dx E11.8 06/13/20   Chevis Pretty, FNP    Allergies:   No Known Allergies  Social History:   Social History   Socioeconomic History  . Marital status: Married    Spouse name: Lelon Frohlich  . Number of children: 1  . Years of education: 71  . Highest education level: Master's degree (e.g., MA, MS, MEng, MEd, MSW, MBA)  Occupational History  . Occupation: Retired    Fish farm manager: [employer]  Tobacco Use  . Smoking status: Never Smoker  . Smokeless tobacco: Never Used  Vaping Use  . Vaping Use: Never used  Substance and Sexual Activity  . Alcohol use: No  . Drug use: No  . Sexual activity: Yes  Other Topics Concern  . Not on file  Social History Narrative   Retired Statistician; Lives with his wife   Social Determinants of Health   Financial Resource Strain: Not on file  Food Insecurity: Not on file  Transportation Needs: Not on file  Physical Activity: Not on file  Stress: No Stress Concern Present  . Feeling of Stress : Not at all  Social Connections: Moderately Isolated  . Frequency of Communication with Friends and Family: More than three times a week  . Frequency of Social Gatherings with Friends and Family: Once a week  . Attends Religious Services: Never  . Active Member of Clubs or Organizations: No  . Attends Archivist Meetings: Never  . Marital Status: Married  Human resources officer Violence: Not At Risk  . Fear of Current or Ex-Partner: No  . Emotionally Abused: No  . Physically Abused: No  . Sexually Abused: No    Family History:   The patient's family history includes Dementia in his father; Diabetes in his father and maternal grandfather; Hyperlipidemia in his father and mother; Hypertension in his father and mother.    ROS:    Review of Systems: [y] = yes, [ ] = no       General: Weight gain [ ]; Weight loss [ ]; Anorexia [ ]; Fatigue [ ]; Fever [ ]; Chills [ ]; Weakness [ ]    Cardiac: Chest pain/pressure [y]; Resting SOB [ ]; Exertional SOB [ ]; Orthopnea [ ]; Pedal Edema [ ]; Palpitations [ ]; Syncope [ ]; Presyncope [ ]; Paroxysmal nocturnal dyspnea [ ]    Pulmonary: Cough [ ]; Wheezing [ ]; Hemoptysis [ ]; Sputum [ ]; Snoring [ ]    GI: Vomiting [ ]; Dysphagia [ ];  Melena [ ]; Hematochezia [ ]; Heartburn [ ]; Abdominal pain [ ]; Constipation [ ]; Diarrhea [ ]; BRBPR [ ]    GU: Hematuria [ ]; Dysuria [ ]; Nocturia [ ]  Vascular: Pain in legs with walking [ ]; Pain in feet with lying flat [ ]; Non-healing sores [ ]; Stroke [ ]; TIA [ ]; Slurred speech [ ];    Neuro: Headaches [ ]; Vertigo [ ]; Seizures [ ]; Paresthesias [ ];Blurred vision [ ]; Diplopia [ ]; Vision changes [ ]    Ortho/Skin: Arthritis [ ]; Joint pain [ ]; Muscle pain [ ]; Joint swelling [ ]; Back Pain [ ]; Rash [ ]    Psych: Depression [ ]; Anxiety [ ]    Heme: Bleeding problems [ ]; Clotting disorders [ ]; Anemia [ ]    Endocrine: Diabetes [ ]; Thyroid dysfunction [ ]   Physical Exam/Data:   Vitals:   08/19/20 0252 08/19/20 0300 08/19/20 0400 08/19/20 0434  BP: (!) 168/93 (!) 166/85 (!) 161/85 (!) 160/75  Pulse: 79 78 81 80  Resp: _0 Temp:   98.4 F (36.9 C) 98.4 F (36.9 C)  TempSrc:   Oral Oral  SpO2: 94% 94% 92% 93%  Weight:    108.4 kg  Height:    5' 9" (1.753 m)   No intake or output data in the 24 hours ending 08/19/20 0700 Last 3 Weights 08/19/2020 08/19/2020 08/15/2020  Weight (lbs) 238 lb 15.7 oz 240 lb 4.8 oz 242 lb 6.4 oz  Weight (kg) 108.4 kg 109 kg 109.952 kg     Body mass index is 35.29 kg/m.  General:  Well nourished, well developed, in no acute distress HEENT: normal Lymph: no adenopathy Neck: n JVD Endocrine:  No thryomegaly Vascular: No carotid bruits; FA pulses 2+ bilaterally without  bruits  Cardiac:  normal S1, S2; RRR; no murmur  Lungs:  clear to auscultation bilaterally, no wheezing, rhonchi or rales  Abd: soft, nontender, no hepatomegaly  Ext: no LE edema Musculoskeletal:  No deformities, BUE and BLE strength normal and equal Skin: warm and dry  Neuro:  CNs 2-12 intact, no focal abnormalities noted Psych:  Normal affect   EKG:  The ECG that was done 08/18/20 was personally reviewed and demonstrates NSR (HR 93) without ischemic changes.  Relevant CV Studies:  TTE Result date: 08/16/20 1. Left ventricular ejection fraction, by estimation, is 60 to 65%. The  left ventricle has normal function. Left ventricular endocardial border  not optimally defined to evaluate regional wall motion. Grossly, no wall  motion abnormalities seen. There is  mild concentric left ventricular hypertrophy. Left ventricular diastolic  parameters are consistent with Grade I diastolic dysfunction (impaired  relaxation). Elevated left atrial pressure.  2. Right ventricular systolic function is normal. The right ventricular  size is normal. Tricuspid regurgitation signal is inadequate for assessing  PA pressure.  3. The mitral valve is normal in structure. No evidence of mitral valve  regurgitation.  4. The aortic valve is tricuspid. There is mild calcification of the  aortic valve. Aortic valve regurgitation is not visualized. Mild aortic  valve sclerosis is present, with no evidence of aortic valve stenosis.   Coronary angiography Result date: 08/15/20  Prox RCA lesion is 70% stenosed.  LPAV lesion is 100% stenosed.  Ost LAD to Prox LAD lesion is 95% stenosed.  Mid LAD lesion is 80% stenosed.  2nd Mrg lesion is 90% stenosed.  Previously  placed 1st LPL stent (unknown type) is widely patent.  Laboratory Data:  High Sensitivity Troponin:   Recent Labs  Lab 08/18/20 2139 08/19/20 0027  TROPONINIHS 48* 1,079*      Chemistry Recent Labs  Lab 08/16/20 0319  08/18/20 2139  NA 135 135  K 4.1 4.3  CL 100 97*  CO2 26 30  GLUCOSE 182* 320*  BUN 15 20  CREATININE 1.00 1.15  CALCIUM 9.1 8.9  GFRNONAA >60 >60  ANIONGAP 9 8    No results for input(s): PROT, ALBUMIN, AST, ALT, ALKPHOS, BILITOT in the last 168 hours. Hematology Recent Labs  Lab 08/16/20 0319 08/18/20 2139  WBC 6.6 6.4  RBC 4.83 4.89  HGB 13.7 13.5  HCT 42.1 43.4  MCV 87.2 88.8  MCH 28.4 27.6  MCHC 32.5 31.1  RDW 14.7 14.9  PLT 228 271   BNPNo results for input(s): BNP, PROBNP in the last 168 hours.  DDimer No results for input(s): DDIMER in the last 168 hours.  Radiology/Studies:  DG Chest 2 View  Result Date: 08/18/2020 CLINICAL DATA:  Chest pain EXAM: CHEST - 2 VIEW COMPARISON:  10/01/2010 FINDINGS: The heart size and mediastinal contours are within normal limits. Both lungs are clear. The visualized skeletal structures are unremarkable. IMPRESSION: No active cardiopulmonary disease. Electronically Signed   By: Ulyses Jarred M.D.   On: 08/18/2020 22:52   TIMI Risk Score for Unstable Angina or Non-ST Elevation MI:   The patient's TIMI risk score is 6, which indicates a 41% risk of all cause mortality, new or recurrent myocardial infarction or need for urgent revascularization in the next 14 days.{  Assessment and Plan:   1. NSTEMI [name] [name] presents with recurrent anginal symptoms identical to prior episodes with associated significant troponin elevation (48->1079).  He had intermittent relief with SL NG at home however his symptoms were persistent until arrival in the hospital.  He is currently chest pain-free on heparin gtt. and not requiring any additional nitroglycerin.  He will remain inpatient until his CABG on Wednesday.  Last dose of prasugrel was this past Wednesday morning.  He has had multiple PCI and has aggressive ISR with the most recent coronary angiogram revealing 95% ISR of his ostial LAD stent. - continue ASA 81 mg PO daily - continue atorva 80 mg  PO qhs - continue coreg 18.75 mg PO daily - continue lisinopril 20 mg PO daily - continue HCTZ 12.5 mg PO daily  - continue imdur 60 mg PO daily, if recurrent CP then uptitrate  - TTE 3 days ago with preserved EF with no obvious WMAs - NPO 05/24 for 05/25 CABG  2. HTN - continue home meds as above   3. DM2  - continue glargine 44 units qAM with breakfast - aspart SSI, usually 10-20 units with meals - continue ozempic 0.5 mg qThurs AM - continue metformin 1000 mg PO bid  4. BPH - continue tamsulosin   Severity of Illness: The appropriate patient status for this patient is INPATIENT. Inpatient status is judged to be reasonable and necessary in order to provide the required intensity of service to ensure the patient's safety. The patient's presenting symptoms, physical exam findings, and initial radiographic and laboratory data in the context of their chronic comorbidities is felt to place them at high risk for further clinical deterioration. Furthermore, it is not anticipated that the patient will be medically stable for discharge from the hospital within 2 midnights of admission. The following factors support the  patient status of inpatient.   " The patient's presenting symptoms include chest pain. " The worrisome physical exam findings include chest pain. " The initial radiographic and laboratory data are worrisome because of elevated troponin. " The chronic co-morbidities include CAD, HTN, HLD, DM2, obesity.  * I certify that at the point of admission it is my clinical judgment that the patient will require inpatient hospital care spanning beyond 2 midnights from the point of admission due to high intensity of service, high risk for further deterioration and high frequency of surveillance required.*   For questions or updates, please contact [facility] Please consult [email or web address] for contact info under   Signed, Dion Body, MD  08/19/2020 7:00 AM

## 2020-08-19 NOTE — ED Provider Notes (Signed)
Care assumed from Dr.'s Fredrich Birks, patient with known severe three-vessel coronary artery disease presents with chest pain relieved by 3 nitroglycerin, pending labs.  If troponin is negative, will discuss with cardiology.  He is scheduled for coronary artery bypass surgery on 5/25.  Troponin has come back 1079.  He is given aspirin and heparin.  Case is discussed with Dr. Ailene Ravel of cardiology service who agrees to admit the patient.  CRITICAL CARE Performed by: Delora Fuel Total critical care time: 35 minutes Critical care time was exclusive of separately billable procedures and treating other patients. Critical care was necessary to treat or prevent imminent or life-threatening deterioration. Critical care was time spent personally by me on the following activities: development of treatment plan with patient and/or surrogate as well as nursing, discussions with consultants, evaluation of patient's response to treatment, examination of patient, obtaining history from patient or surrogate, ordering and performing treatments and interventions, ordering and review of laboratory studies, ordering and review of radiographic studies, pulse oximetry and re-evaluation of patient's condition.   Delora Fuel, MD 61/44/31 [phone]

## 2020-08-19 NOTE — Progress Notes (Signed)
Progress Note  Patient Name: [name] [name] Date of Encounter: 08/19/2020  [facility] Cardiologist: Rozann Lesches, MD   Subjective   [name] [name] was admitted last night with recurrent angina.  He was sent home Friday after being cath the day before with aggressive "in-stent restenosis" within the proximal LAD stent and mid LAD as well.  Plan was for CABG.  He had recurrent chest pain last night.  He is currently on IV heparin.  Is pain-free.  His enzymes are positive with a troponin of 1000.  He scheduled for bypass surgery on Wednesday.  Inpatient Medications    Scheduled Meds: . [START ON 08/20/2020] aspirin EC  81 mg Oral Daily  . atorvastatin  80 mg Oral QHS  . carvedilol  18.75 mg Oral BID  . lisinopril  20 mg Oral Daily   And  . hydrochlorothiazide  12.5 mg Oral Daily  . insulin aspart  0-20 Units Subcutaneous TID WC  . insulin glargine  44 Units Subcutaneous Q breakfast  . isosorbide mononitrate  60 mg Oral Daily  . metFORMIN  1,000 mg Oral BID WC  . [START ON 08/22/2020] Semaglutide(0.25 or 0.5MG /DOS)  0.5 mg Subcutaneous Q Thu  . sodium chloride flush  3 mL Intravenous Q12H  . tamsulosin  0.4 mg Oral QHS   Continuous Infusions: . heparin 1,800 Units/hr (08/19/20 0310)   PRN Meds: acetaminophen, nitroGLYCERIN, ondansetron (ZOFRAN) IV, pantoprazole   Vital Signs    Vitals:   08/19/20 0300 08/19/20 0400 08/19/20 0434 08/19/20 0719  BP: (!) 166/85 (!) 161/85 (!) 160/75 133/74  Pulse: 78 81 80 69  Resp: 17 19 16 17   Temp:  98.4 F (36.9 C) 98.4 F (36.9 C) 98.7 F (37.1 C)  TempSrc:  Oral Oral Oral  SpO2: 94% 92% 93% 92%  Weight:   108.4 kg   Height:   5\' 9"  (1.753 m)    No intake or output data in the 24 hours ending 08/19/20 0952 Last 3 Weights 08/19/2020 08/19/2020 08/15/2020  Weight (lbs) 238 lb 15.7 oz 240 lb 4.8 oz 242 lb 6.4 oz  Weight (kg) 108.4 kg 109 kg 109.952 kg      Telemetry    Sinus rhythm- Personally Reviewed  ECG    Not performed  today- Personally Reviewed  Physical Exam   GEN: No acute distress.   Neck: No JVD Cardiac: RRR, no murmurs, rubs, or gallops.  Respiratory: Clear to auscultation bilaterally. GI: Soft, nontender, non-distended  MS: No edema; No deformity. Neuro:  Nonfocal  Psych: Normal affect   Labs    High Sensitivity Troponin:   Recent Labs  Lab 08/18/20 2139 08/19/20 0027  TROPONINIHS 48* 1,079*      Chemistry Recent Labs  Lab 08/16/20 0319 08/18/20 2139  NA 135 135  K 4.1 4.3  CL 100 97*  CO2 26 30  GLUCOSE 182* 320*  BUN 15 20  CREATININE 1.00 1.15  CALCIUM 9.1 8.9  GFRNONAA >60 >60  ANIONGAP 9 8     Hematology Recent Labs  Lab 08/16/20 0319 08/18/20 2139  WBC 6.6 6.4  RBC 4.83 4.89  HGB 13.7 13.5  HCT 42.1 43.4  MCV 87.2 88.8  MCH 28.4 27.6  MCHC 32.5 31.1  RDW 14.7 14.9  PLT 228 271    BNPNo results for input(s): BNP, PROBNP in the last 168 hours.   DDimer No results for input(s): DDIMER in the last 168 hours.   Radiology    DG Chest  2 View  Result Date: 08/18/2020 CLINICAL DATA:  Chest pain EXAM: CHEST - 2 VIEW COMPARISON:  10/01/2010 FINDINGS: The heart size and mediastinal contours are within normal limits. Both lungs are clear. The visualized skeletal structures are unremarkable. IMPRESSION: No active cardiopulmonary disease. Electronically Signed   By: Ulyses Jarred M.D.   On: 08/18/2020 22:52    Cardiac Studies   2D echocardiogram (08/16/2020)  IMPRESSIONS    1. Left ventricular ejection fraction, by estimation, is 60 to 65%. The  left ventricle has normal function. Left ventricular endocardial border  not optimally defined to evaluate regional wall motion. Grossly, no wall  motion abnormalities seen. There is  mild concentric left ventricular hypertrophy. Left ventricular diastolic  parameters are consistent with Grade I diastolic dysfunction (impaired  relaxation). Elevated left atrial pressure.  2. Right ventricular systolic function  is normal. The right ventricular  size is normal. Tricuspid regurgitation signal is inadequate for assessing  PA pressure.  3. The mitral valve is normal in structure. No evidence of mitral valve  regurgitation.  4. The aortic valve is tricuspid. There is mild calcification of the  aortic valve. Aortic valve regurgitation is not visualized. Mild aortic  valve sclerosis is present, with no evidence of aortic valve stenosis.  Cardiac catheterization (08/15/2020)  Conclusion    Prox RCA lesion is 70% stenosed.  LPAV lesion is 100% stenosed.  Ost LAD to Prox LAD lesion is 95% stenosed.  Mid LAD lesion is 80% stenosed.  2nd Mrg lesion is 90% stenosed.  Previously placed 1st LPL stent (for CABG on Wednesday came back in last night with a non-STEMI type) is widely patent.  Coronary Diagrams   Diagnostic Dominance: Left    Intervention Is   Cath in 1 Thursday last week proximal LAD stent mid bypass Effient washout in the hospital with what looks  Patient Profile     [name] [name] is a 65 y.o. male with CAD s/p multiple PCI (most recent 04/2020) with ISR, HTN, GERD, HLD, prior VF arrest, and DM2 who presents with recurrent CP.   She does smoker for me in  Assessment & Plan    1: CAD- history of proximal mid LAD stenting with 3 intervention by Dr. Irish [name] back in November of last year.  I performed cardiac catheterization on him this past Thursday revealing aggressive in-stent restenosis.  I thought he needed bypass surgery although he was on Effient and therefore needed a Effient "washout".  He was sent home on Friday, had recurrent chest pain on Sunday and was readmitted with a non-STEMI with a troponin over thousand.  He is currently pain-free on heparin with plans to perform CABG on Wednesday.  2: Hyperlipidemia- on statin therapy  Patient stable on IV heparin.  Plans for CABG this Wednesday.  For questions or updates, please contact [place] Please consult  [email or web address] for contact info under        Signed, Quay Burow, MD  08/19/2020, 9:52 AM

## 2020-08-19 NOTE — Progress Notes (Signed)
ANTICOAGULATION CONSULT NOTE - Initial Consult  Pharmacy Consult for Heparin Indication: chest pain/ACS  No Known Allergies  Patient Measurements: Height: 5\' 9"  (175.3 cm) Weight: 109 kg (240 lb 4.8 oz) IBW/kg (Calculated) : 70.7 Heparin Dosing Weight: 94 kg  Vital Signs: Temp: 99.3 F (37.4 C) (05/22 2130) Temp Source: Oral (05/22 2130) BP: 165/80 (05/23 0130) Pulse Rate: 81 (05/23 0130)  Labs: Recent Labs    08/16/20 0319 08/18/20 2139 08/19/20 0027  HGB 13.7 13.5  --   HCT 42.1 43.4  --   PLT 228 271  --   CREATININE 1.00 1.15  --   TROPONINIHS  --  48* 1,079*    Estimated Creatinine Clearance: 75.8 mL/min (by C-G formula based on SCr of 1.15 mg/dL).   Medical History: Past Medical History:  Diagnosis Date  . Anxiety   . Coronary atherosclerosis of native coronary artery    DES distal circumflex 2005; DES LAD/diagonal bifurcation 09/2010; DES ostial LAD and DES left PL 01/2020  . Essential hypertension   . GERD (gastroesophageal reflux disease)   . History of kidney stones   . Mixed hyperlipidemia   . Myocardial infarction ([place])    Anterolateral with VF arrest 7/12  . Type 2 diabetes mellitus (HCC)     Medications:  Awaiting home med rec  Assessment: 65 y.o. M presentsents with CP. Pt with known 3v CAD and scheduled for CABG on 5/25. To begin heparin per pharmacy. CBC ok on admission. No AC PTA.  Pt required ~2000 units/hr heparin in the past to achieve therapeutic range.  Goal of Therapy:  Heparin level 0.3-0.7 units/ml Monitor platelets by anticoagulation protocol: Yes   Plan:  Heparin IV bolus 4000 units Heparin gtt at 1800 units/hr Will f/u heparin level in 6 hours Daily heparin level and CBC  Sherlon Handing, PharmD, BCPS Please see amion for complete clinical pharmacist phone list 08/19/2020,1:51 AM

## 2020-08-19 NOTE — Plan of Care (Signed)

## 2020-08-19 NOTE — Plan of Care (Signed)
  Problem: Clinical Measurements: Goal: Will remain free from infection Outcome: Progressing Goal: Respiratory complications will improve Outcome: Progressing   Problem: Activity: Goal: Risk for activity intolerance will decrease Outcome: Progressing   Problem: Nutrition: Goal: Adequate nutrition will be maintained Outcome: Progressing   Problem: Coping: Goal: Level of anxiety will decrease Outcome: Progressing   Problem: Elimination: Goal: Will not experience complications related to bowel motility Outcome: Progressing   Problem: Pain Managment: Goal: General experience of comfort will improve Outcome: Progressing   Problem: Safety: Goal: Ability to remain free from injury will improve Outcome: Progressing

## 2020-08-20 ENCOUNTER — Inpatient Hospital Stay (HOSPITAL_COMMUNITY): Admit: 2020-08-20 | Payer: Medicare PPO

## 2020-08-20 DIAGNOSIS — E1169 Type 2 diabetes mellitus with other specified complication: Secondary | ICD-10-CM

## 2020-08-20 DIAGNOSIS — I10 Essential (primary) hypertension: Secondary | ICD-10-CM

## 2020-08-20 DIAGNOSIS — Z794 Long term (current) use of insulin: Secondary | ICD-10-CM

## 2020-08-20 DIAGNOSIS — E118 Type 2 diabetes mellitus with unspecified complications: Secondary | ICD-10-CM

## 2020-08-20 LAB — URINALYSIS, ROUTINE W REFLEX MICROSCOPIC
Bacteria, UA: NONE SEEN
Bilirubin Urine: NEGATIVE
Glucose, UA: >=500 mg/dL — AB
Hgb urine dipstick: NEGATIVE
Ketones, ur: NEGATIVE mg/dL
Leukocytes,Ua: NEGATIVE
Nitrite: NEGATIVE
Protein, ur: NEGATIVE mg/dL
Specific Gravity, Urine: 1.016 (ref 1.005–1.030)
pH: 6 (ref 5.0–8.0)

## 2020-08-20 LAB — CBC
HCT: 37.8 % — ABNORMAL LOW (ref 39.0–52.0)
Hemoglobin: 12 g/dL — ABNORMAL LOW (ref 13.0–17.0)
MCH: 27.6 pg (ref 26.0–34.0)
MCHC: 31.7 g/dL (ref 30.0–36.0)
MCV: 87.1 fL (ref 80.0–100.0)
Platelets: 251 10*3/uL (ref 150–400)
RBC: 4.34 MIL/uL (ref 4.22–5.81)
RDW: 14.9 % (ref 11.5–15.5)
WBC: 7.4 10*3/uL (ref 4.0–10.5)
nRBC: 0 % (ref 0.0–0.2)

## 2020-08-20 LAB — BASIC METABOLIC PANEL
Anion gap: 9 (ref 5–15)
BUN: 18 mg/dL (ref 8–23)
CO2: 27 mmol/L (ref 22–32)
Calcium: 8.9 mg/dL (ref 8.9–10.3)
Chloride: 99 mmol/L (ref 98–111)
Creatinine, Ser: 1 mg/dL (ref 0.61–1.24)
GFR, Estimated: >60 mL/min (ref >60)
Glucose, Bld: 166 mg/dL — ABNORMAL HIGH (ref 70–99)
Potassium: 3.8 mmol/L (ref 3.5–5.1)
Sodium: 135 mmol/L (ref 135–145)

## 2020-08-20 LAB — BLOOD GAS, ARTERIAL
Acid-Base Excess: 1.9 mmol/L (ref 0.0–2.0)
Bicarbonate: 25.9 mmol/L (ref 20.0–28.0)
Drawn by: 519031
FIO2: 21
O2 Saturation: 94.2 %
Patient temperature: 37
pCO2 arterial: 40.1 mmHg (ref 32.0–48.0)
pH, Arterial: 7.425 (ref 7.350–7.450)
pO2, Arterial: 73 mmHg — ABNORMAL LOW (ref 83.0–108.0)

## 2020-08-20 LAB — TYPE AND SCREEN
ABO/RH(D): A POS
Antibody Screen: NEGATIVE

## 2020-08-20 LAB — GLUCOSE, CAPILLARY
Glucose-Capillary: 206 mg/dL — ABNORMAL HIGH (ref 70–99)
Glucose-Capillary: 286 mg/dL — ABNORMAL HIGH (ref 70–99)
Glucose-Capillary: 228 mg/dL — ABNORMAL HIGH (ref 70–99)
Glucose-Capillary: 293 mg/dL — ABNORMAL HIGH (ref 70–99)

## 2020-08-20 LAB — ABO/RH: ABO/RH(D): A POS

## 2020-08-20 LAB — APTT: aPTT: 80 seconds — ABNORMAL HIGH (ref 24–36)

## 2020-08-20 LAB — MRSA PCR SCREENING: MRSA by PCR: NEGATIVE

## 2020-08-20 LAB — PROTIME-INR
INR: 1 (ref 0.8–1.2)
Prothrombin Time: 13.1 seconds (ref 11.4–15.2)

## 2020-08-20 LAB — HEPARIN LEVEL (UNFRACTIONATED): Heparin Unfractionated: 0.53 IU/mL (ref 0.30–0.70)

## 2020-08-20 MED ORDER — NOREPINEPHRINE 4 MG/250ML-% IV SOLN
0.0000 ug/min | INTRAVENOUS | Status: DC
  Filled 2020-08-20: qty 250

## 2020-08-20 MED ORDER — NITROGLYCERIN IN D5W 200-5 MCG/ML-% IV SOLN
2.0000 ug/min | INTRAVENOUS | Status: DC
  Filled 2020-08-20: qty 250

## 2020-08-20 MED ORDER — INSULIN REGULAR(HUMAN) IN NACL 100-0.9 UT/100ML-% IV SOLN
INTRAVENOUS | Status: AC
  Administered 2020-08-21: 14 [IU]/h via INTRAVENOUS
  Filled 2020-08-20: qty 100

## 2020-08-20 MED ORDER — CEFAZOLIN SODIUM-DEXTROSE 2-4 GM/100ML-% IV SOLN
2.0000 g | INTRAVENOUS | Status: AC
  Administered 2020-08-21 (×2): 2 g via INTRAVENOUS
  Filled 2020-08-20: qty 100

## 2020-08-20 MED ORDER — CEFAZOLIN SODIUM-DEXTROSE 2-4 GM/100ML-% IV SOLN
2.0000 g | INTRAVENOUS | Status: DC
  Filled 2020-08-20: qty 100

## 2020-08-20 MED ORDER — CHLORHEXIDINE GLUCONATE CLOTH 2 % EX PADS
6.0000 | MEDICATED_PAD | Freq: Once | CUTANEOUS | Status: AC
  Administered 2020-08-21: 6 via TOPICAL

## 2020-08-20 MED ORDER — BISACODYL 5 MG PO TBEC
5.0000 mg | DELAYED_RELEASE_TABLET | Freq: Once | ORAL | Status: AC
  Administered 2020-08-20: 5 mg via ORAL
  Filled 2020-08-20: qty 1

## 2020-08-20 MED ORDER — CHLORHEXIDINE GLUCONATE CLOTH 2 % EX PADS
6.0000 | MEDICATED_PAD | Freq: Once | CUTANEOUS | Status: AC
  Administered 2020-08-20: 6 via TOPICAL

## 2020-08-20 MED ORDER — DEXMEDETOMIDINE HCL IN NACL 400 MCG/100ML IV SOLN
0.1000 ug/kg/h | INTRAVENOUS | Status: AC
  Administered 2020-08-21: .5 ug/kg/h via INTRAVENOUS
  Filled 2020-08-20: qty 100

## 2020-08-20 MED ORDER — MAGNESIUM SULFATE 50 % IJ SOLN
40.0000 meq | INTRAMUSCULAR | Status: DC
  Filled 2020-08-20: qty 9.85

## 2020-08-20 MED ORDER — TRANEXAMIC ACID (OHS) PUMP PRIME SOLUTION
2.0000 mg/kg | INTRAVENOUS | Status: DC
  Filled 2020-08-20 (×2): qty 2.17

## 2020-08-20 MED ORDER — VANCOMYCIN HCL 1500 MG/300ML IV SOLN
1500.0000 mg | INTRAVENOUS | Status: AC
  Administered 2020-08-21: 1500 mg via INTRAVENOUS
  Filled 2020-08-20: qty 300

## 2020-08-20 MED ORDER — SODIUM CHLORIDE 0.9 % IV SOLN
INTRAVENOUS | Status: DC
  Filled 2020-08-20: qty 30

## 2020-08-20 MED ORDER — PLASMA-LYTE 148 IV SOLN
INTRAVENOUS | Status: DC
  Filled 2020-08-20: qty 2.5

## 2020-08-20 MED ORDER — POTASSIUM CHLORIDE 2 MEQ/ML IV SOLN
80.0000 meq | INTRAVENOUS | Status: DC
  Filled 2020-08-20: qty 40

## 2020-08-20 MED ORDER — TEMAZEPAM 15 MG PO CAPS
15.0000 mg | ORAL_CAPSULE | Freq: Once | ORAL | Status: AC | PRN
  Administered 2020-08-20: 15 mg via ORAL
  Filled 2020-08-20: qty 1

## 2020-08-20 MED ORDER — TRANEXAMIC ACID 1000 MG/10ML IV SOLN
1.5000 mg/kg/h | INTRAVENOUS | Status: AC
  Administered 2020-08-21: 1.5 mg/kg/h via INTRAVENOUS
  Filled 2020-08-20 (×2): qty 25

## 2020-08-20 MED ORDER — DIAZEPAM 5 MG PO TABS
5.0000 mg | ORAL_TABLET | Freq: Once | ORAL | Status: AC
  Administered 2020-08-21: 5 mg via ORAL
  Filled 2020-08-20: qty 1

## 2020-08-20 MED ORDER — PHENYLEPHRINE HCL-NACL 20-0.9 MG/250ML-% IV SOLN
30.0000 ug/min | INTRAVENOUS | Status: AC
  Administered 2020-08-21: 25 ug/min via INTRAVENOUS
  Administered 2020-08-21: 40 ug/min via INTRAVENOUS
  Filled 2020-08-20: qty 250

## 2020-08-20 MED ORDER — MILRINONE LACTATE IN DEXTROSE 20-5 MG/100ML-% IV SOLN
0.3000 ug/kg/min | INTRAVENOUS | Status: DC
  Filled 2020-08-20: qty 100

## 2020-08-20 MED ORDER — TRANEXAMIC ACID (OHS) BOLUS VIA INFUSION
15.0000 mg/kg | INTRAVENOUS | Status: AC
  Administered 2020-08-21: 1287 mg via INTRAVENOUS
  Filled 2020-08-20 (×2): qty 1287

## 2020-08-20 MED ORDER — CHLORHEXIDINE GLUCONATE 0.12 % MT SOLN
15.0000 mL | Freq: Once | OROMUCOSAL | Status: AC
  Administered 2020-08-21: 15 mL via OROMUCOSAL
  Filled 2020-08-20: qty 15

## 2020-08-20 MED ORDER — EPINEPHRINE HCL 5 MG/250ML IV SOLN IN NS
0.0000 ug/min | INTRAVENOUS | Status: AC
  Administered 2020-08-21: 3 ug/min via INTRAVENOUS
  Filled 2020-08-20: qty 250

## 2020-08-20 MED ORDER — ~~LOC~~ CARDIAC SURGERY, PATIENT & FAMILY EDUCATION
Freq: Once | Status: DC
  Filled 2020-08-20: qty 1

## 2020-08-20 MED ORDER — METOPROLOL TARTRATE 12.5 MG HALF TABLET
12.5000 mg | ORAL_TABLET | Freq: Once | ORAL | Status: AC
  Administered 2020-08-21: 12.5 mg via ORAL
  Filled 2020-08-20: qty 1

## 2020-08-20 NOTE — Progress Notes (Signed)
Procedure(s) (LRB): CORONARY ARTERY BYPASS GRAFTING (CABG) (N/A) TRANSESOPHAGEAL ECHOCARDIOGRAM (TEE) (N/A) Subjective: Readmitted over the weekend with recurrent angina None since readmission  Objective: Vital signs in last 24 hours: Temp:  [97.8 F (36.6 C)-98.6 F (37 C)] 98.1 F (36.7 C) (05/24 0723) Pulse Rate:  [66-85] 68 (05/24 0458) Cardiac Rhythm: Normal sinus rhythm (05/24 0723) Resp:  [16-20] 19 (05/24 0458) BP: (122-148)/(69-78) 122/78 (05/24 0458) SpO2:  [92 %-100 %] 100 % (05/24 0458)  Hemodynamic parameters for last 24 hours:    Intake/Output from previous day: No intake/output data recorded. Intake/Output this shift: No intake/output data recorded.  General appearance: alert, cooperative and no distress Neurologic: intact Heart: regular rate and rhythm Lungs: clear to auscultation bilaterally  Lab Results: Recent Labs    08/18/20 2139 08/20/20 0128  WBC 6.4 7.4  HGB 13.5 12.0*  HCT 43.4 37.8*  PLT 271 251   BMET:  Recent Labs    08/18/20 2139 08/20/20 0128  NA 135 135  K 4.3 3.8  CL 97* 99  CO2 30 27  GLUCOSE 320* 166*  BUN 20 18  CREATININE 1.15 1.00  CALCIUM 8.9 8.9    PT/INR: No results for input(s): LABPROT, INR in the last 72 hours. ABG    Component Value Date/Time   TCO2 22 01/08/2015 0803   CBG (last 3)  Recent Labs    08/19/20 1630 08/19/20 2156 08/20/20 0611  GLUCAP 262* 183* 206*    Assessment/Plan: S/P Procedure(s) (LRB): CORONARY ARTERY BYPASS GRAFTING (CABG) (N/A) TRANSESOPHAGEAL ECHOCARDIOGRAM (TEE) (N/A) Severe CAD with unstable angina For CABG in AM Echo showed normal EF with no significant valve pathology Carotids Ok All questions answered   LOS: 1 day    [name] [name] 08/20/2020

## 2020-08-20 NOTE — Progress Notes (Signed)
Inpatient Diabetes Program Recommendations  AACE/ADA: New Consensus Statement on Inpatient Glycemic Control (2015)  Target Ranges:  Prepandial:   less than 140 mg/dL      Peak postprandial:   less than 180 mg/dL (1-2 hours)      Critically ill patients:  140 - 180 mg/dL   Lab Results  Component Value Date   GLUCAP 286 (H) 08/20/2020   HGBA1C 8.2 (H) 08/16/2020    Review of Glycemic Control Results for [name] [name], [name] [name] (MRN [id number]) as of 08/20/2020 11:30  Ref. Range 08/19/2020 06:11 08/19/2020 11:06 08/19/2020 16:30 08/19/2020 21:56 08/20/2020 06:11 08/20/2020 10:58  Glucose-Capillary Latest Ref Range: 70 - 99 mg/dL 191 (H) 179 (H) 262 (H) 183 (H) 206 (H) 286 (H)    Inpatient Diabetes Program Recommendations:     Discontinue Metformin while inpatient.    Will continue to follow while inpatient.  Thank you, Reche Dixon, RN, BSN Diabetes Coordinator Inpatient Diabetes Program [phone] (team pager from 8a-5p)

## 2020-08-20 NOTE — Progress Notes (Signed)
Progress Note  Patient Name: [name] [name] Date of Encounter: 08/20/2020  [facility] Cardiologist: Rozann Lesches, MD   Subjective   No further chest pains.  Nothing like he had on Sunday.  Just occasional twinges.  No dyspnea.  No edema.  No palpitations.  Inpatient Medications    Scheduled Meds: . aspirin EC  81 mg Oral Daily  . atorvastatin  80 mg Oral QHS  . carvedilol  18.75 mg Oral BID  . lisinopril  20 mg Oral Daily   And  . hydrochlorothiazide  12.5 mg Oral Daily  . insulin aspart  0-20 Units Subcutaneous TID WC  . insulin glargine  44 Units Subcutaneous Q breakfast  . isosorbide mononitrate  60 mg Oral Daily  . metFORMIN  1,000 mg Oral BID WC  . sodium chloride flush  3 mL Intravenous Q12H  . tamsulosin  0.4 mg Oral QHS   Continuous Infusions: . heparin 1,800 Units/hr (08/20/20 0542)   PRN Meds: acetaminophen, nitroGLYCERIN, ondansetron (ZOFRAN) IV, pantoprazole   Vital Signs    Vitals:   08/19/20 2000 08/19/20 2344 08/20/20 0458 08/20/20 0723  BP: 129/74 131/71 122/78   Pulse: 71 66 68   Resp: 19 20 19    Temp: 98.6 F (37 C) 97.8 F (36.6 C) 97.8 F (36.6 C) 98.1 F (36.7 C)  TempSrc: Oral Oral Oral Oral  SpO2: 98% 99% 100%   Weight:      Height:       No intake or output data in the 24 hours ending 08/20/20 0928 Last 3 Weights 08/19/2020 08/19/2020 08/15/2020  Weight (lbs) 238 lb 15.7 oz 240 lb 4.8 oz 242 lb 6.4 oz  Weight (kg) 108.4 kg 109 kg 109.952 kg      Telemetry    Mostly sinus rhythm with PACs and PVCs.  PVC couplets noted.  Also "missed beats "which appear to possibly be blocked PACs- Personally Reviewed  ECG    Sinus rhythm with nonspecific ST and T wave changes.  Rate 69 bpm.- Personally Reviewed  Physical Exam   GEN: No acute distress.   Neck: No JVD Cardiac:   RRR, no murmurs, rubs, or gallops.  Respiratory: Clear to auscultation bilaterally. GI: Soft, nontender, non-distended  MS: No edema; No deformity. Neuro:   Nonfocal  Psych: Normal affect   Labs    High Sensitivity Troponin:   Recent Labs  Lab 08/18/20 2139 08/19/20 0027  TROPONINIHS 48* 1,079*      Chemistry Recent Labs  Lab 08/16/20 0319 08/18/20 2139 08/20/20 0128  NA 135 135 135  K 4.1 4.3 3.8  CL 100 97* 99  CO2 26 30 27   GLUCOSE 182* 320* 166*  BUN 15 20 18   CREATININE 1.00 1.15 1.00  CALCIUM 9.1 8.9 8.9  GFRNONAA >60 >60 >60  ANIONGAP 9 8 9      Hematology Recent Labs  Lab 08/16/20 0319 08/18/20 2139 08/20/20 0128  WBC 6.6 6.4 7.4  RBC 4.83 4.89 4.34  HGB 13.7 13.5 12.0*  HCT 42.1 43.4 37.8*  MCV 87.2 88.8 87.1  MCH 28.4 27.6 27.6  MCHC 32.5 31.1 31.7  RDW 14.7 14.9 14.9  PLT 228 271 251    BNPNo results for input(s): BNP, PROBNP in the last 168 hours.   DDimer No results for input(s): DDIMER in the last 168 hours.   Radiology    DG Chest 2 View  Result Date: 08/18/2020 CLINICAL DATA:  Chest pain EXAM: CHEST - 2 VIEW COMPARISON:  10/01/2010 FINDINGS: The heart size and mediastinal contours are within normal limits. Both lungs are clear. The visualized skeletal structures are unremarkable. IMPRESSION: No active cardiopulmonary disease. Electronically Signed   By: Ulyses Jarred M.D.   On: 08/18/2020 22:52    Cardiac Studies    Cardiac Cath 08/15/2020: pRCA 70%, LPAV 100%, Ost-prox LAD 95%, mLAD eccentric (calcified) 80%, OM2 90%, patent LPL1 stent.     Echo 08/16/2020: EF 60 to 65%.  Unable to assess regional wall motion.  GR 1 DD.  Unable to assess PAP.  Mild aortic valve sclerosis but no stenosis.  Patient Profile     65 y.o. male with known CAD (h/o Ant-Lat STEMI & VF arrest in the past, multiple PCI and most recently February 2022) recently found to have significant two-vessel disease (severe ostial LAD with mid LCx) with plans for CABG.  He was discharged home post cath with plans for CABG on 08/21/2020.  Unfortunately he returned on 08/18/2020 with recurrent anginal chest pain concerning for  unstable angina, and is ruled in for non-STEMI.  He is therefore admitted for inpatient management until CABG.  Assessment & Plan    Principal Problem:   NSTEMI (non-ST elevated myocardial infarction) ([place]) Active Problems:   Coronary artery disease involving native coronary artery of native heart with unstable angina pectoris ([place])   Hyperlipidemia associated with type 2 diabetes mellitus ([place])   Type 2 diabetes mellitus with complication, with long-term current use of insulin ([place])   Essential hypertension, benign     NSTEMI (non-ST elevated myocardial infarction) ([place])-- IV heparin for non-STEMI.    Coronary artery disease involving native coronary artery of native heart with unstable angina pectoris (HCC) -> 2 V CAD noted on Cath with plans for CABG tomorrow (5/25)  On aspirin and high-dose high intensity statin along with stable dose of beta-blocker and ACE inhibitor.  On Imdur 60 mg  Active Problems:      Type 2 diabetes mellitus with complication, with long-term current use of insulin (HCC)  On Lantus, metformin and Ozempic at home along with intermittent insulin. ->  We will stop metformin while inpatient, continue sliding scale insulin along with basal glargine.    Essential hypertension, benign.  On combination of moderate dose carvedilol (18.75 mg twice daily) along with ACE inhibitor. Marland Kitchen  HCTZ from home on hold.   Hyperlipidemia with target LDL less than 70 -> high-dose, high intensity statin    For questions or updates, please contact [place] Please consult [email or web address] for contact info under        Signed, Glenetta Hew, MD  08/20/2020, 9:28 AM

## 2020-08-20 NOTE — Anesthesia Preprocedure Evaluation (Addendum)
Anesthesia Evaluation  Patient identified by MRN, date of birth, ID band Patient awake    Reviewed: Allergy & Precautions, NPO status , Patient's Chart, lab work & pertinent test results  History of Anesthesia Complications Negative for: history of anesthetic complications  Airway Mallampati: III  TM Distance: >3 FB Neck ROM: Full    Dental  (+) Dental Advisory Given   Pulmonary neg pulmonary ROS,    Pulmonary exam normal        Cardiovascular hypertension, Pt. on home beta blockers and Pt. on medications + angina with exertion + CAD, + Past MI and + Cardiac Stents  Normal cardiovascular exam   '22 Carotid US - 1-39% b/l ICAS  '22 TTE - EF 60 to 65%. Mild concentric left ventricular hypertrophy. Grade I diastolic dysfunction (impaired  relaxation). Mild AV sclerosis is present, with no evidence of aortic valve stenosis.    '22 Cath - Prox RCA lesion is 70% stenosed. LPAV lesion is 100% stenosed. Ost LAD to Prox LAD lesion is 95% stenosed. Mid LAD lesion is 80% stenosed. 2nd Mrg lesion is 90% stenosed. Previously placed 1st LPL stent (unknown type) is widely patent.      Neuro/Psych PSYCHIATRIC DISORDERS Anxiety negative neurological ROS     GI/Hepatic Neg liver ROS, GERD  Medicated and Controlled,  Endo/Other  diabetes, Type 2, Oral Hypoglycemic Agents, Insulin Dependent Obesity   Renal/GU negative Renal ROS     Musculoskeletal negative musculoskeletal ROS (+)   Abdominal   Peds  Hematology negative hematology ROS (+)   Anesthesia Other Findings Covid test negative 08/12/20     Reproductive/Obstetrics                            Anesthesia Physical Anesthesia Plan  ASA: IV  Anesthesia Plan: General   Post-op Pain Management:    Induction: Intravenous  PONV Risk Score and Plan: 2 and Treatment may vary due to age or medical condition  Airway Management Planned:  Oral ETT  Additional Equipment: Arterial line, CVP, PA Cath, TEE and Ultrasound Guidance Line Placement  Intra-op Plan:   Post-operative Plan: Post-operative intubation/ventilation  Informed Consent: I have reviewed the patients History and Physical, chart, labs and discussed the procedure including the risks, benefits and alternatives for the proposed anesthesia with the patient or authorized representative who has indicated his/her understanding and acceptance.     Dental advisory given  Plan Discussed with: CRNA and Anesthesiologist  Anesthesia Plan Comments:        Anesthesia Quick Evaluation

## 2020-08-20 NOTE — Plan of Care (Signed)
  Problem: Education: Goal: Knowledge of General Education information will improve Description: Including pain rating scale, medication(s)/side effects and non-pharmacologic comfort measures Outcome: Progressing   Problem: Health Behavior/Discharge Planning: Goal: Ability to manage health-related needs will improve Outcome: Progressing   Problem: Clinical Measurements: Goal: Ability to maintain clinical measurements within normal limits will improve Outcome: Progressing Goal: Will remain free from infection Outcome: Progressing Goal: Diagnostic test results will improve Outcome: Progressing   Problem: Activity: Goal: Risk for activity intolerance will decrease Outcome: Progressing   Problem: Nutrition: Goal: Adequate nutrition will be maintained Outcome: Progressing   Problem: Coping: Goal: Level of anxiety will decrease Outcome: Progressing   Problem: Pain Managment: Goal: General experience of comfort will improve Outcome: Progressing   

## 2020-08-20 NOTE — Plan of Care (Signed)

## 2020-08-20 NOTE — Progress Notes (Signed)
ANTICOAGULATION CONSULT NOTE  Pharmacy Consult for Heparin Indication: chest pain/ACS  No Known Allergies  Patient Measurements: Height: 5\' 9"  (175.3 cm) Weight: 108.4 kg (238 lb 15.7 oz) IBW/kg (Calculated) : 70.7 Heparin Dosing Weight: 94 kg  Vital Signs: Temp: 98.1 F (36.7 C) (05/24 0723) Temp Source: Oral (05/24 0723) BP: 122/78 (05/24 0458) Pulse Rate: 68 (05/24 0458)  Labs: Recent Labs    08/18/20 2139 08/19/20 0027 08/19/20 0858 08/20/20 0128  HGB 13.5  --   --  12.0*  HCT 43.4  --   --  37.8*  PLT 271  --   --  251  HEPARINUNFRC  --   --  0.51 0.53  CREATININE 1.15  --   --  1.00  TROPONINIHS [name]* 1,079*  --   --     Estimated Creatinine Clearance: 87 mL/min (by C-G formula based on SCr of 1 mg/dL).   Medical History: Past Medical History:  Diagnosis Date  . Anxiety   . Coronary atherosclerosis of native coronary artery    DES distal circumflex 2005; DES LAD/diagonal bifurcation 09/2010; DES ostial LAD and DES left PL 01/2020  . Essential hypertension   . GERD (gastroesophageal reflux disease)   . History of kidney stones   . Mixed hyperlipidemia   . Myocardial infarction ([place])    Anterolateral with VF arrest 7/12  . Type 2 diabetes mellitus (HCC)      Assessment: 65 y.o. [name] [name] presents with CP. Pt with known 3v CAD and scheduled for CABG on 5/25. To begin heparin per pharmacy. CBC ok on admission. No AC PTA.  Heparin level is therapeutic at 0.53, CBC stable.  Goal of Therapy:  Heparin level 0.3-0.7 units/ml Monitor platelets by anticoagulation protocol: Yes   Plan:  Continue heparin 1800 units/h Daily heparin level and CBC   [name] [name] [name], PharmD, [place], [facility] Clinical Pharmacist [phone] Please check AMION for all [place] numbers 08/20/2020

## 2020-08-21 ENCOUNTER — Inpatient Hospital Stay (HOSPITAL_COMMUNITY): Payer: Medicare PPO

## 2020-08-21 ENCOUNTER — Encounter (HOSPITAL_COMMUNITY): Payer: Self-pay | Admitting: Student in an Organized Health Care Education/Training Program

## 2020-08-21 ENCOUNTER — Inpatient Hospital Stay (HOSPITAL_COMMUNITY): Payer: Medicare PPO | Admitting: Anesthesiology

## 2020-08-21 ENCOUNTER — Encounter (HOSPITAL_COMMUNITY): Payer: Medicare PPO

## 2020-08-21 ENCOUNTER — Inpatient Hospital Stay (HOSPITAL_COMMUNITY)
Admission: EM | Disposition: A | Discharge status: Home / Self Care | Payer: Self-pay | Source: Home / Self Care | Attending: Thoracic Surgery (Cardiothoracic Vascular Surgery)

## 2020-08-21 ENCOUNTER — Inpatient Hospital Stay: Admit: 2020-08-21 | Payer: Medicare PPO | Admitting: Thoracic Surgery (Cardiothoracic Vascular Surgery)

## 2020-08-21 DIAGNOSIS — Z951 Presence of aortocoronary bypass graft: Secondary | ICD-10-CM

## 2020-08-21 HISTORY — PX: ENDOVEIN HARVEST OF GREATER SAPHENOUS VEIN: SHX5059

## 2020-08-21 HISTORY — PX: TEE WITHOUT CARDIOVERSION: SHX5443

## 2020-08-21 HISTORY — PX: CORONARY ARTERY BYPASS GRAFT: SHX141

## 2020-08-21 LAB — POCT I-STAT 7, (LYTES, BLD GAS, ICA,H+H)
Acid-Base Excess: 1 mmol/L (ref 0.0–2.0)
Acid-Base Excess: 3 mmol/L — ABNORMAL HIGH (ref 0.0–2.0)
Acid-Base Excess: 1 mmol/L (ref 0.0–2.0)
Acid-Base Excess: 2 mmol/L (ref 0.0–2.0)
Acid-base deficit: 2 mmol/L (ref 0.0–2.0)
Acid-base deficit: 3 mmol/L — ABNORMAL HIGH (ref 0.0–2.0)
Acid-base deficit: 3 mmol/L — ABNORMAL HIGH (ref 0.0–2.0)
Acid-base deficit: 3 mmol/L — ABNORMAL HIGH (ref 0.0–2.0)
Acid-base deficit: 3 mmol/L — ABNORMAL HIGH (ref 0.0–2.0)
Acid-base deficit: 2 mmol/L (ref 0.0–2.0)
Bicarbonate: 26.5 mmol/L (ref 20.0–28.0)
Bicarbonate: 26.8 mmol/L (ref 20.0–28.0)
Bicarbonate: 25.3 mmol/L (ref 20.0–28.0)
Bicarbonate: 25.4 mmol/L (ref 20.0–28.0)
Bicarbonate: 24.7 mmol/L (ref 20.0–28.0)
Bicarbonate: 23.2 mmol/L (ref 20.0–28.0)
Bicarbonate: 23.8 mmol/L (ref 20.0–28.0)
Bicarbonate: 23.2 mmol/L (ref 20.0–28.0)
Bicarbonate: 22.9 mmol/L (ref 20.0–28.0)
Bicarbonate: 22.8 mmol/L (ref 20.0–28.0)
Calcium, Ion: 0.98 mmol/L — ABNORMAL LOW (ref 1.15–1.40)
Calcium, Ion: 1.04 mmol/L — ABNORMAL LOW (ref 1.15–1.40)
Calcium, Ion: 1.04 mmol/L — ABNORMAL LOW (ref 1.15–1.40)
Calcium, Ion: 1.07 mmol/L — ABNORMAL LOW (ref 1.15–1.40)
Calcium, Ion: 1.24 mmol/L (ref 1.15–1.40)
Calcium, Ion: 1.19 mmol/L (ref 1.15–1.40)
Calcium, Ion: 1.3 mmol/L (ref 1.15–1.40)
Calcium, Ion: 1.24 mmol/L (ref 1.15–1.40)
Calcium, Ion: 1.2 mmol/L (ref 1.15–1.40)
Calcium, Ion: 1.19 mmol/L (ref 1.15–1.40)
HCT: 28 % — ABNORMAL LOW (ref 39.0–52.0)
HCT: 26 % — ABNORMAL LOW (ref 39.0–52.0)
HCT: 27 % — ABNORMAL LOW (ref 39.0–52.0)
HCT: 28 % — ABNORMAL LOW (ref 39.0–52.0)
HCT: 29 % — ABNORMAL LOW (ref 39.0–52.0)
HCT: 32 % — ABNORMAL LOW (ref 39.0–52.0)
HCT: 31 % — ABNORMAL LOW (ref 39.0–52.0)
HCT: 32 % — ABNORMAL LOW (ref 39.0–52.0)
HCT: 31 % — ABNORMAL LOW (ref 39.0–52.0)
HCT: 31 % — ABNORMAL LOW (ref 39.0–52.0)
Hemoglobin: 9.5 g/dL — ABNORMAL LOW (ref 13.0–17.0)
Hemoglobin: 8.8 g/dL — ABNORMAL LOW (ref 13.0–17.0)
Hemoglobin: 9.2 g/dL — ABNORMAL LOW (ref 13.0–17.0)
Hemoglobin: 9.5 g/dL — ABNORMAL LOW (ref 13.0–17.0)
Hemoglobin: 9.9 g/dL — ABNORMAL LOW (ref 13.0–17.0)
Hemoglobin: 10.9 g/dL — ABNORMAL LOW (ref 13.0–17.0)
Hemoglobin: 10.5 g/dL — ABNORMAL LOW (ref 13.0–17.0)
Hemoglobin: 10.9 g/dL — ABNORMAL LOW (ref 13.0–17.0)
Hemoglobin: 10.5 g/dL — ABNORMAL LOW (ref 13.0–17.0)
Hemoglobin: 10.5 g/dL — ABNORMAL LOW (ref 13.0–17.0)
O2 Saturation: 100 %
O2 Saturation: 100 %
O2 Saturation: 100 %
O2 Saturation: 100 %
O2 Saturation: 100 %
O2 Saturation: 96 %
O2 Saturation: 95 %
O2 Saturation: 98 %
O2 Saturation: 97 %
O2 Saturation: 97 %
Patient temperature: 35.4
Patient temperature: 37
Patient temperature: 37.3
Patient temperature: 37.5
Potassium: 4.3 mmol/L (ref 3.5–5.1)
Potassium: 5.6 mmol/L — ABNORMAL HIGH (ref 3.5–5.1)
Potassium: 5.1 mmol/L (ref 3.5–5.1)
Potassium: 5.8 mmol/L — ABNORMAL HIGH (ref 3.5–5.1)
Potassium: 4.6 mmol/L (ref 3.5–5.1)
Potassium: 4.3 mmol/L (ref 3.5–5.1)
Potassium: 5 mmol/L (ref 3.5–5.1)
Potassium: 5.2 mmol/L — ABNORMAL HIGH (ref 3.5–5.1)
Potassium: 5.3 mmol/L — ABNORMAL HIGH (ref 3.5–5.1)
Potassium: 5.2 mmol/L — ABNORMAL HIGH (ref 3.5–5.1)
Sodium: 137 mmol/L (ref 135–145)
Sodium: 135 mmol/L (ref 135–145)
Sodium: 135 mmol/L (ref 135–145)
Sodium: 136 mmol/L (ref 135–145)
Sodium: 137 mmol/L (ref 135–145)
Sodium: 138 mmol/L (ref 135–145)
Sodium: 137 mmol/L (ref 135–145)
Sodium: 137 mmol/L (ref 135–145)
Sodium: 136 mmol/L (ref 135–145)
Sodium: 137 mmol/L (ref 135–145)
TCO2: 28 mmol/L (ref 22–32)
TCO2: 28 mmol/L (ref 22–32)
TCO2: 26 mmol/L (ref 22–32)
TCO2: 27 mmol/L (ref 22–32)
TCO2: 26 mmol/L (ref 22–32)
TCO2: 25 mmol/L (ref 22–32)
TCO2: 25 mmol/L (ref 22–32)
TCO2: 25 mmol/L (ref 22–32)
TCO2: 24 mmol/L (ref 22–32)
TCO2: 24 mmol/L (ref 22–32)
pCO2 arterial: 44.8 mmHg (ref 32.0–48.0)
pCO2 arterial: 37.7 mmHg (ref 32.0–48.0)
pCO2 arterial: 35.2 mmHg (ref 32.0–48.0)
pCO2 arterial: 39.7 mmHg (ref 32.0–48.0)
pCO2 arterial: 48.4 mmHg — ABNORMAL HIGH (ref 32.0–48.0)
pCO2 arterial: 42.4 mmHg (ref 32.0–48.0)
pCO2 arterial: 50.5 mmHg — ABNORMAL HIGH (ref 32.0–48.0)
pCO2 arterial: 43.6 mmHg (ref 32.0–48.0)
pCO2 arterial: 43 mmHg (ref 32.0–48.0)
pCO2 arterial: 40.2 mmHg (ref 32.0–48.0)
pH, Arterial: 7.38 (ref 7.350–7.450)
pH, Arterial: 7.46 — ABNORMAL HIGH (ref 7.350–7.450)
pH, Arterial: 7.412 (ref 7.350–7.450)
pH, Arterial: 7.467 — ABNORMAL HIGH (ref 7.350–7.450)
pH, Arterial: 7.316 — ABNORMAL LOW (ref 7.350–7.450)
pH, Arterial: 7.337 — ABNORMAL LOW (ref 7.350–7.450)
pH, Arterial: 7.281 — ABNORMAL LOW (ref 7.350–7.450)
pH, Arterial: 7.335 — ABNORMAL LOW (ref 7.350–7.450)
pH, Arterial: 7.335 — ABNORMAL LOW (ref 7.350–7.450)
pH, Arterial: 7.364 (ref 7.350–7.450)
pO2, Arterial: 416 mmHg — ABNORMAL HIGH (ref 83.0–108.0)
pO2, Arterial: 358 mmHg — ABNORMAL HIGH (ref 83.0–108.0)
pO2, Arterial: 289 mmHg — ABNORMAL HIGH (ref 83.0–108.0)
pO2, Arterial: 317 mmHg — ABNORMAL HIGH (ref 83.0–108.0)
pO2, Arterial: 223 mmHg — ABNORMAL HIGH (ref 83.0–108.0)
pO2, Arterial: 83 mmHg (ref 83.0–108.0)
pO2, Arterial: 87 mmHg (ref 83.0–108.0)
pO2, Arterial: 105 mmHg (ref 83.0–108.0)
pO2, Arterial: 101 mmHg (ref 83.0–108.0)
pO2, Arterial: 98 mmHg (ref 83.0–108.0)

## 2020-08-21 LAB — POCT I-STAT, CHEM 8
BUN: 19 mg/dL (ref 8–23)
BUN: 18 mg/dL (ref 8–23)
BUN: 17 mg/dL (ref 8–23)
BUN: 17 mg/dL (ref 8–23)
BUN: 16 mg/dL (ref 8–23)
Calcium, Ion: 1.29 mmol/L (ref 1.15–1.40)
Calcium, Ion: 1.22 mmol/L (ref 1.15–1.40)
Calcium, Ion: 1.07 mmol/L — ABNORMAL LOW (ref 1.15–1.40)
Calcium, Ion: 1.08 mmol/L — ABNORMAL LOW (ref 1.15–1.40)
Calcium, Ion: 1.23 mmol/L (ref 1.15–1.40)
Chloride: 97 mmol/L — ABNORMAL LOW (ref 98–111)
Chloride: 98 mmol/L (ref 98–111)
Chloride: 101 mmol/L (ref 98–111)
Chloride: 98 mmol/L (ref 98–111)
Chloride: 101 mmol/L (ref 98–111)
Creatinine, Ser: 0.7 mg/dL (ref 0.61–1.24)
Creatinine, Ser: 0.9 mg/dL (ref 0.61–1.24)
Creatinine, Ser: 0.7 mg/dL (ref 0.61–1.24)
Creatinine, Ser: 0.7 mg/dL (ref 0.61–1.24)
Creatinine, Ser: 0.7 mg/dL (ref 0.61–1.24)
Glucose, Bld: 255 mg/dL — ABNORMAL HIGH (ref 70–99)
Glucose, Bld: 255 mg/dL — ABNORMAL HIGH (ref 70–99)
Glucose, Bld: 182 mg/dL — ABNORMAL HIGH (ref 70–99)
Glucose, Bld: 193 mg/dL — ABNORMAL HIGH (ref 70–99)
Glucose, Bld: 180 mg/dL — ABNORMAL HIGH (ref 70–99)
HCT: 38 % — ABNORMAL LOW (ref 39.0–52.0)
HCT: 33 % — ABNORMAL LOW (ref 39.0–52.0)
HCT: 26 % — ABNORMAL LOW (ref 39.0–52.0)
HCT: 27 % — ABNORMAL LOW (ref 39.0–52.0)
HCT: 29 % — ABNORMAL LOW (ref 39.0–52.0)
Hemoglobin: 12.9 g/dL — ABNORMAL LOW (ref 13.0–17.0)
Hemoglobin: 11.2 g/dL — ABNORMAL LOW (ref 13.0–17.0)
Hemoglobin: 8.8 g/dL — ABNORMAL LOW (ref 13.0–17.0)
Hemoglobin: 9.2 g/dL — ABNORMAL LOW (ref 13.0–17.0)
Hemoglobin: 9.9 g/dL — ABNORMAL LOW (ref 13.0–17.0)
Potassium: 4.4 mmol/L (ref 3.5–5.1)
Potassium: 4 mmol/L (ref 3.5–5.1)
Potassium: 5 mmol/L (ref 3.5–5.1)
Potassium: 5.2 mmol/L — ABNORMAL HIGH (ref 3.5–5.1)
Potassium: 4.6 mmol/L (ref 3.5–5.1)
Sodium: 136 mmol/L (ref 135–145)
Sodium: 135 mmol/L (ref 135–145)
Sodium: 133 mmol/L — ABNORMAL LOW (ref 135–145)
Sodium: 135 mmol/L (ref 135–145)
Sodium: 135 mmol/L (ref 135–145)
TCO2: 28 mmol/L (ref 22–32)
TCO2: 25 mmol/L (ref 22–32)
TCO2: 26 mmol/L (ref 22–32)
TCO2: 26 mmol/L (ref 22–32)
TCO2: 24 mmol/L (ref 22–32)

## 2020-08-21 LAB — CBC
HCT: 38.7 % — ABNORMAL LOW (ref 39.0–52.0)
HCT: 34.5 % — ABNORMAL LOW (ref 39.0–52.0)
HCT: 34 % — ABNORMAL LOW (ref 39.0–52.0)
Hemoglobin: 12.6 g/dL — ABNORMAL LOW (ref 13.0–17.0)
Hemoglobin: 11 g/dL — ABNORMAL LOW (ref 13.0–17.0)
Hemoglobin: 11 g/dL — ABNORMAL LOW (ref 13.0–17.0)
MCH: 28.3 pg (ref 26.0–34.0)
MCH: 28 pg (ref 26.0–34.0)
MCH: 28.2 pg (ref 26.0–34.0)
MCHC: 32.6 g/dL (ref 30.0–36.0)
MCHC: 31.9 g/dL (ref 30.0–36.0)
MCHC: 32.4 g/dL (ref 30.0–36.0)
MCV: 87 fL (ref 80.0–100.0)
MCV: 87.8 fL (ref 80.0–100.0)
MCV: 87.2 fL (ref 80.0–100.0)
Platelets: 226 10*3/uL (ref 150–400)
Platelets: 170 10*3/uL (ref 150–400)
Platelets: 180 10*3/uL (ref 150–400)
RBC: 4.45 MIL/uL (ref 4.22–5.81)
RBC: 3.93 MIL/uL — ABNORMAL LOW (ref 4.22–5.81)
RBC: 3.9 MIL/uL — ABNORMAL LOW (ref 4.22–5.81)
RDW: 14.6 % (ref 11.5–15.5)
RDW: 14.6 % (ref 11.5–15.5)
RDW: 14.5 % (ref 11.5–15.5)
WBC: 6.5 10*3/uL (ref 4.0–10.5)
WBC: 10.2 10*3/uL (ref 4.0–10.5)
WBC: 9 10*3/uL (ref 4.0–10.5)
nRBC: 0 % (ref 0.0–0.2)
nRBC: 0 % (ref 0.0–0.2)
nRBC: 0 % (ref 0.0–0.2)

## 2020-08-21 LAB — GLUCOSE, CAPILLARY
Glucose-Capillary: 225 mg/dL — ABNORMAL HIGH (ref 70–99)
Glucose-Capillary: 136 mg/dL — ABNORMAL HIGH (ref 70–99)
Glucose-Capillary: 133 mg/dL — ABNORMAL HIGH (ref 70–99)
Glucose-Capillary: 101 mg/dL — ABNORMAL HIGH (ref 70–99)
Glucose-Capillary: 126 mg/dL — ABNORMAL HIGH (ref 70–99)
Glucose-Capillary: 108 mg/dL — ABNORMAL HIGH (ref 70–99)
Glucose-Capillary: 87 mg/dL (ref 70–99)
Glucose-Capillary: 131 mg/dL — ABNORMAL HIGH (ref 70–99)
Glucose-Capillary: 151 mg/dL — ABNORMAL HIGH (ref 70–99)
Glucose-Capillary: 111 mg/dL — ABNORMAL HIGH (ref 70–99)
Glucose-Capillary: 133 mg/dL — ABNORMAL HIGH (ref 70–99)

## 2020-08-21 LAB — POCT I-STAT EG7
Acid-Base Excess: 0 mmol/L (ref 0.0–2.0)
Bicarbonate: 26.5 mmol/L (ref 20.0–28.0)
Calcium, Ion: 1.07 mmol/L — ABNORMAL LOW (ref 1.15–1.40)
HCT: 29 % — ABNORMAL LOW (ref 39.0–52.0)
Hemoglobin: 9.9 g/dL — ABNORMAL LOW (ref 13.0–17.0)
O2 Saturation: 75 %
Potassium: 4.1 mmol/L (ref 3.5–5.1)
Sodium: 137 mmol/L (ref 135–145)
TCO2: 28 mmol/L (ref 22–32)
pCO2, Ven: 50.5 mmHg (ref 44.0–60.0)
pH, Ven: 7.329 (ref 7.250–7.430)
pO2, Ven: 44 mmHg (ref 32.0–45.0)

## 2020-08-21 LAB — BASIC METABOLIC PANEL
Anion gap: 10 (ref 5–15)
Anion gap: 6 (ref 5–15)
BUN: 17 mg/dL (ref 8–23)
BUN: 15 mg/dL (ref 8–23)
CO2: 24 mmol/L (ref 22–32)
CO2: 23 mmol/L (ref 22–32)
Calcium: 9 mg/dL (ref 8.9–10.3)
Calcium: 8.5 mg/dL — ABNORMAL LOW (ref 8.9–10.3)
Chloride: 101 mmol/L (ref 98–111)
Chloride: 105 mmol/L (ref 98–111)
Creatinine, Ser: 1 mg/dL (ref 0.61–1.24)
Creatinine, Ser: 0.86 mg/dL (ref 0.61–1.24)
GFR, Estimated: >60 mL/min (ref >60)
GFR, Estimated: >60 mL/min (ref >60)
Glucose, Bld: 218 mg/dL — ABNORMAL HIGH (ref 70–99)
Glucose, Bld: 94 mg/dL (ref 70–99)
Potassium: 4.4 mmol/L (ref 3.5–5.1)
Potassium: 5.2 mmol/L — ABNORMAL HIGH (ref 3.5–5.1)
Sodium: 135 mmol/L (ref 135–145)
Sodium: 134 mmol/L — ABNORMAL LOW (ref 135–145)

## 2020-08-21 LAB — HEMOGLOBIN AND HEMATOCRIT, BLOOD
HCT: 29 % — ABNORMAL LOW (ref 39.0–52.0)
Hemoglobin: 9.5 g/dL — ABNORMAL LOW (ref 13.0–17.0)

## 2020-08-21 LAB — ECHO INTRAOPERATIVE TEE
Height: 69 in
Weight: 3784.86 oz

## 2020-08-21 LAB — APTT: aPTT: 29 seconds (ref 24–36)

## 2020-08-21 LAB — HEPARIN LEVEL (UNFRACTIONATED): Heparin Unfractionated: 0.52 IU/mL (ref 0.30–0.70)

## 2020-08-21 LAB — PROTIME-INR
INR: 1.3 — ABNORMAL HIGH (ref 0.8–1.2)
Prothrombin Time: 15.8 seconds — ABNORMAL HIGH (ref 11.4–15.2)

## 2020-08-21 LAB — MAGNESIUM: Magnesium: 3 mg/dL — ABNORMAL HIGH (ref 1.7–2.4)

## 2020-08-21 LAB — PLATELET COUNT: Platelets: 190 10*3/uL (ref 150–400)

## 2020-08-21 SURGERY — CORONARY ARTERY BYPASS GRAFTING (CABG)
Anesthesia: General | Site: Chest | Laterality: Right

## 2020-08-21 MED ORDER — ONDANSETRON HCL 4 MG/2ML IJ SOLN
INTRAMUSCULAR | Status: AC
  Filled 2020-08-21: qty 2

## 2020-08-21 MED ORDER — DOCUSATE SODIUM 100 MG PO CAPS
200.0000 mg | ORAL_CAPSULE | Freq: Every day | ORAL | Status: DC
  Administered 2020-08-22 – 2020-08-26 (×5): 200 mg via ORAL
  Filled 2020-08-21 (×5): qty 2

## 2020-08-21 MED ORDER — SODIUM CHLORIDE 0.45 % IV SOLN: INTRAVENOUS | Status: DC | PRN

## 2020-08-21 MED ORDER — ONDANSETRON HCL 4 MG/2ML IJ SOLN
4.0000 mg | Freq: Four times a day (QID) | INTRAMUSCULAR | Status: DC | PRN
  Administered 2020-08-21 – 2020-08-22 (×2): 4 mg via INTRAVENOUS
  Filled 2020-08-21 (×2): qty 2

## 2020-08-21 MED ORDER — STERILE WATER FOR IRRIGATION IR SOLN
Status: DC | PRN
  Administered 2020-08-21: 2000 mL

## 2020-08-21 MED ORDER — ALBUMIN HUMAN 5 % IV SOLN: INTRAVENOUS | Status: DC | PRN

## 2020-08-21 MED ORDER — INSULIN REGULAR(HUMAN) IN NACL 100-0.9 UT/100ML-% IV SOLN
INTRAVENOUS | Status: DC
  Administered 2020-08-21: 0.6 [IU]/h via INTRAVENOUS
  Administered 2020-08-21: 11 [IU]/h via INTRAVENOUS
  Filled 2020-08-21: qty 100

## 2020-08-21 MED ORDER — BISACODYL 5 MG PO TBEC
10.0000 mg | DELAYED_RELEASE_TABLET | Freq: Every day | ORAL | Status: DC
  Administered 2020-08-22 – 2020-08-26 (×5): 10 mg via ORAL
  Filled 2020-08-21 (×6): qty 2

## 2020-08-21 MED ORDER — LACTATED RINGERS IV SOLN: INTRAVENOUS | Status: DC | PRN

## 2020-08-21 MED ORDER — VASOPRESSIN 20 UNIT/ML IV SOLN
INTRAVENOUS | Status: AC
  Filled 2020-08-21: qty 1

## 2020-08-21 MED ORDER — BISACODYL 10 MG RE SUPP: 10.0000 mg | Freq: Every day | RECTAL | Status: DC

## 2020-08-21 MED ORDER — PROTAMINE SULFATE 10 MG/ML IV SOLN
INTRAVENOUS | Status: DC | PRN
  Administered 2020-08-21: 300 mg via INTRAVENOUS

## 2020-08-21 MED ORDER — LACTATED RINGERS IV SOLN: INTRAVENOUS | Status: DC

## 2020-08-21 MED ORDER — METOPROLOL TARTRATE 25 MG/10 ML ORAL SUSPENSION: 12.5000 mg | Freq: Two times a day (BID) | ORAL | Status: DC

## 2020-08-21 MED ORDER — SODIUM CHLORIDE 0.9 % IV SOLN: INTRAVENOUS | Status: DC | PRN

## 2020-08-21 MED ORDER — CHLORHEXIDINE GLUCONATE 0.12 % MT SOLN
15.0000 mL | OROMUCOSAL | Status: AC
  Administered 2020-08-21: 15 mL via OROMUCOSAL

## 2020-08-21 MED ORDER — ATORVASTATIN CALCIUM 80 MG PO TABS
80.0000 mg | ORAL_TABLET | Freq: Every day | ORAL | Status: DC
  Administered 2020-08-22 – 2020-08-25 (×4): 80 mg via ORAL
  Filled 2020-08-21 (×4): qty 1

## 2020-08-21 MED ORDER — ROCURONIUM BROMIDE 10 MG/ML (PF) SYRINGE
PREFILLED_SYRINGE | INTRAVENOUS | Status: AC
  Filled 2020-08-21: qty 30

## 2020-08-21 MED ORDER — CHLORHEXIDINE GLUCONATE CLOTH 2 % EX PADS
6.0000 | MEDICATED_PAD | Freq: Every day | CUTANEOUS | Status: DC
  Administered 2020-08-21 – 2020-08-23 (×3): 6 via TOPICAL

## 2020-08-21 MED ORDER — FAMOTIDINE IN NACL 20-0.9 MG/50ML-% IV SOLN
20.0000 mg | Freq: Two times a day (BID) | INTRAVENOUS | Status: AC
  Administered 2020-08-21: 20 mg via INTRAVENOUS
  Filled 2020-08-21 (×2): qty 50

## 2020-08-21 MED ORDER — HEPARIN SODIUM (PORCINE) 1000 UNIT/ML IJ SOLN
INTRAMUSCULAR | Status: AC
  Filled 2020-08-21: qty 1

## 2020-08-21 MED ORDER — MIDAZOLAM HCL (PF) 5 MG/ML IJ SOLN: INTRAMUSCULAR | Status: DC | PRN

## 2020-08-21 MED ORDER — ASPIRIN EC 325 MG PO TBEC
325.0000 mg | DELAYED_RELEASE_TABLET | Freq: Every day | ORAL | Status: DC
  Administered 2020-08-22 – 2020-08-26 (×5): 325 mg via ORAL
  Filled 2020-08-21 (×6): qty 1

## 2020-08-21 MED ORDER — METOPROLOL TARTRATE 12.5 MG HALF TABLET: 12.5000 mg | ORAL_TABLET | Freq: Two times a day (BID) | ORAL | Status: DC

## 2020-08-21 MED ORDER — PROTAMINE SULFATE 10 MG/ML IV SOLN
INTRAVENOUS | Status: AC
  Filled 2020-08-21: qty 5

## 2020-08-21 MED ORDER — SODIUM CHLORIDE 0.9 % IV SOLN: 250.0000 mL | INTRAVENOUS | Status: DC

## 2020-08-21 MED ORDER — SODIUM CHLORIDE 0.9% FLUSH
10.0000 mL | Freq: Two times a day (BID) | INTRAVENOUS | Status: DC
Start: 2020-08-21 — End: 2020-08-23
  Administered 2020-08-21 – 2020-08-23 (×5): 10 mL

## 2020-08-21 MED ORDER — SODIUM CHLORIDE 0.9% FLUSH: 10.0000 mL | INTRAVENOUS | Status: DC | PRN

## 2020-08-21 MED ORDER — DEXTROSE 50 % IV SOLN: 0.0000 mL | INTRAVENOUS | Status: DC | PRN

## 2020-08-21 MED ORDER — PROPOFOL 10 MG/ML IV BOLUS
INTRAVENOUS | Status: AC
  Filled 2020-08-21: qty 20

## 2020-08-21 MED ORDER — OXYCODONE HCL 5 MG PO TABS
5.0000 mg | ORAL_TABLET | ORAL | Status: DC | PRN
  Administered 2020-08-21 – 2020-08-25 (×7): 10 mg via ORAL
  Filled 2020-08-21 (×8): qty 2

## 2020-08-21 MED ORDER — SODIUM CHLORIDE 0.9 % IV SOLN: INTRAVENOUS | Status: DC

## 2020-08-21 MED ORDER — MORPHINE SULFATE (PF) 2 MG/ML IV SOLN
1.0000 mg | INTRAVENOUS | Status: DC | PRN
  Administered 2020-08-21 (×2): 4 mg via INTRAVENOUS
  Administered 2020-08-22: 2 mg via INTRAVENOUS
  Administered 2020-08-22: 4 mg via INTRAVENOUS
  Filled 2020-08-21 (×4): qty 2
  Filled 2020-08-21: qty 1

## 2020-08-21 MED ORDER — FENTANYL CITRATE (PF) 250 MCG/5ML IJ SOLN
INTRAMUSCULAR | Status: AC
  Filled 2020-08-21: qty 25

## 2020-08-21 MED ORDER — PROPOFOL 10 MG/ML IV BOLUS
INTRAVENOUS | Status: DC | PRN
  Administered 2020-08-21: 40 mg via INTRAVENOUS

## 2020-08-21 MED ORDER — HEMOSTATIC AGENTS (NO CHARGE) OPTIME
TOPICAL | Status: DC | PRN
  Administered 2020-08-21: 1 via TOPICAL

## 2020-08-21 MED ORDER — SODIUM CHLORIDE (PF) 0.9 % IJ SOLN
INTRAMUSCULAR | Status: AC
  Filled 2020-08-21: qty 30

## 2020-08-21 MED ORDER — MIDAZOLAM HCL (PF) 10 MG/2ML IJ SOLN
INTRAMUSCULAR | Status: AC
  Filled 2020-08-21: qty 2

## 2020-08-21 MED ORDER — NITROGLYCERIN IN D5W 200-5 MCG/ML-% IV SOLN: 0.0000 ug/min | INTRAVENOUS | Status: DC

## 2020-08-21 MED ORDER — PHENYLEPHRINE HCL-NACL 20-0.9 MG/250ML-% IV SOLN: 0.0000 ug/min | INTRAVENOUS | Status: DC

## 2020-08-21 MED ORDER — PANTOPRAZOLE SODIUM 40 MG PO TBEC
40.0000 mg | DELAYED_RELEASE_TABLET | Freq: Every day | ORAL | Status: DC
  Administered 2020-08-23 – 2020-08-26 (×4): 40 mg via ORAL
  Filled 2020-08-21 (×4): qty 1

## 2020-08-21 MED ORDER — MIDAZOLAM HCL 2 MG/2ML IJ SOLN: 2.0000 mg | INTRAMUSCULAR | Status: DC | PRN

## 2020-08-21 MED ORDER — ROCURONIUM BROMIDE 10 MG/ML (PF) SYRINGE
PREFILLED_SYRINGE | INTRAVENOUS | Status: DC | PRN
  Administered 2020-08-21: 50 mg via INTRAVENOUS
  Administered 2020-08-21: 15 mg via INTRAVENOUS
  Administered 2020-08-21: 100 mg via INTRAVENOUS
  Administered 2020-08-21: 50 mg via INTRAVENOUS

## 2020-08-21 MED ORDER — ACETAMINOPHEN 160 MG/5ML PO SOLN: 1000.0000 mg | Freq: Four times a day (QID) | ORAL | Status: DC

## 2020-08-21 MED ORDER — ALBUMIN HUMAN 5 % IV SOLN
250.0000 mL | INTRAVENOUS | Status: AC | PRN
  Administered 2020-08-21 (×3): 12.5 g via INTRAVENOUS
  Filled 2020-08-21: qty 250

## 2020-08-21 MED ORDER — POTASSIUM CHLORIDE 10 MEQ/50ML IV SOLN: 10.0000 meq | INTRAVENOUS | Status: AC

## 2020-08-21 MED ORDER — METOPROLOL TARTRATE 5 MG/5ML IV SOLN: 2.5000 mg | INTRAVENOUS | Status: DC | PRN

## 2020-08-21 MED ORDER — EPINEPHRINE HCL 5 MG/250ML IV SOLN IN NS
0.5000 ug/min | INTRAVENOUS | Status: DC
  Administered 2020-08-21: 1 ug/min via INTRAVENOUS

## 2020-08-21 MED ORDER — VANCOMYCIN HCL IN DEXTROSE 1-5 GM/200ML-% IV SOLN
1000.0000 mg | Freq: Once | INTRAVENOUS | Status: AC
  Administered 2020-08-21: 1000 mg via INTRAVENOUS
  Filled 2020-08-21: qty 200

## 2020-08-21 MED ORDER — PROTAMINE SULFATE 10 MG/ML IV SOLN
INTRAVENOUS | Status: AC
  Filled 2020-08-21: qty 25

## 2020-08-21 MED ORDER — SODIUM CHLORIDE 0.9% FLUSH
3.0000 mL | Freq: Two times a day (BID) | INTRAVENOUS | Status: DC
  Administered 2020-08-22 – 2020-08-23 (×3): 3 mL via INTRAVENOUS

## 2020-08-21 MED ORDER — HEPARIN SODIUM (PORCINE) 1000 UNIT/ML IJ SOLN
INTRAMUSCULAR | Status: DC | PRN
  Administered 2020-08-21: 30000 [IU] via INTRAVENOUS
  Administered 2020-08-21: 2000 [IU] via INTRAVENOUS

## 2020-08-21 MED ORDER — ONDANSETRON HCL 4 MG/2ML IJ SOLN
INTRAMUSCULAR | Status: DC | PRN
  Administered 2020-08-21 (×2): 4 mg via INTRAVENOUS

## 2020-08-21 MED ORDER — ASPIRIN 81 MG PO CHEW: 324.0000 mg | CHEWABLE_TABLET | Freq: Every day | ORAL | Status: DC

## 2020-08-21 MED ORDER — ACETAMINOPHEN 160 MG/5ML PO SOLN: 650.0000 mg | Freq: Once | ORAL | Status: AC

## 2020-08-21 MED ORDER — PHENYLEPHRINE 40 MCG/ML (10ML) SYRINGE FOR IV PUSH (FOR BLOOD PRESSURE SUPPORT)
PREFILLED_SYRINGE | INTRAVENOUS | Status: DC | PRN
  Administered 2020-08-21 (×2): 80 ug via INTRAVENOUS
  Administered 2020-08-21: 40 ug via INTRAVENOUS

## 2020-08-21 MED ORDER — CALCIUM CHLORIDE 10 % IV SOLN
1.0000 g | Freq: Once | INTRAVENOUS | Status: AC
  Administered 2020-08-21: 1 g via INTRAVENOUS

## 2020-08-21 MED ORDER — FENTANYL CITRATE (PF) 250 MCG/5ML IJ SOLN
INTRAMUSCULAR | Status: DC | PRN
  Administered 2020-08-21: 100 ug via INTRAVENOUS
  Administered 2020-08-21: 250 ug via INTRAVENOUS
  Administered 2020-08-21: 50 ug via INTRAVENOUS
  Administered 2020-08-21 (×2): 100 ug via INTRAVENOUS
  Administered 2020-08-21: 150 ug via INTRAVENOUS
  Administered 2020-08-21: 100 ug via INTRAVENOUS
  Administered 2020-08-21: 150 ug via INTRAVENOUS
  Administered 2020-08-21: 100 ug via INTRAVENOUS

## 2020-08-21 MED ORDER — PLASMA-LYTE 148 IV SOLN
INTRAVENOUS | Status: DC | PRN
  Administered 2020-08-21: 500 mL via INTRAVASCULAR

## 2020-08-21 MED ORDER — ACETAMINOPHEN 500 MG PO TABS
1000.0000 mg | ORAL_TABLET | Freq: Four times a day (QID) | ORAL | Status: DC
  Administered 2020-08-21 – 2020-08-26 (×17): 1000 mg via ORAL
  Filled 2020-08-21 (×17): qty 2

## 2020-08-21 MED ORDER — TRAMADOL HCL 50 MG PO TABS
50.0000 mg | ORAL_TABLET | ORAL | Status: DC | PRN
  Administered 2020-08-23: 50 mg via ORAL
  Filled 2020-08-21: qty 1

## 2020-08-21 MED ORDER — 0.9 % SODIUM CHLORIDE (POUR BTL) OPTIME
TOPICAL | Status: DC | PRN
  Administered 2020-08-21: 2000 mL

## 2020-08-21 MED ORDER — CEFAZOLIN SODIUM-DEXTROSE 2-4 GM/100ML-% IV SOLN
2.0000 g | Freq: Three times a day (TID) | INTRAVENOUS | Status: AC
  Administered 2020-08-21 – 2020-08-23 (×6): 2 g via INTRAVENOUS
  Filled 2020-08-21 (×6): qty 100

## 2020-08-21 MED ORDER — ACETAMINOPHEN 650 MG RE SUPP
650.0000 mg | Freq: Once | RECTAL | Status: AC
  Administered 2020-08-21: 650 mg via RECTAL

## 2020-08-21 MED ORDER — DEXMEDETOMIDINE HCL IN NACL 400 MCG/100ML IV SOLN
0.0000 ug/kg/h | INTRAVENOUS | Status: DC
  Administered 2020-08-21: 0.6 ug/kg/h via INTRAVENOUS
  Filled 2020-08-21: qty 100

## 2020-08-21 MED ORDER — SODIUM CHLORIDE 0.9% FLUSH: 3.0000 mL | INTRAVENOUS | Status: DC | PRN

## 2020-08-21 MED ORDER — VASOPRESSIN 20 UNIT/ML IV SOLN
INTRAVENOUS | Status: DC | PRN
  Administered 2020-08-21: 1 [IU] via INTRAVENOUS

## 2020-08-21 MED ORDER — SODIUM CHLORIDE (PF) 0.9 % IJ SOLN
OROMUCOSAL | Status: DC | PRN
  Administered 2020-08-21 (×3): 4 mL via TOPICAL

## 2020-08-21 MED ORDER — NITROGLYCERIN 0.2 MG/ML ON CALL CATH LAB
INTRAVENOUS | Status: DC | PRN
  Administered 2020-08-21: 40 ug via INTRAVENOUS

## 2020-08-21 MED ORDER — MAGNESIUM SULFATE 4 GM/100ML IV SOLN
4.0000 g | Freq: Once | INTRAVENOUS | Status: AC
  Administered 2020-08-21: 4 g via INTRAVENOUS
  Filled 2020-08-21: qty 100

## 2020-08-21 MED ORDER — LACTATED RINGERS IV SOLN: 500.0000 mL | Freq: Once | INTRAVENOUS | Status: DC | PRN

## 2020-08-21 MED ORDER — PHENYLEPHRINE 40 MCG/ML (10ML) SYRINGE FOR IV PUSH (FOR BLOOD PRESSURE SUPPORT)
PREFILLED_SYRINGE | INTRAVENOUS | Status: AC
  Filled 2020-08-21: qty 10

## 2020-08-21 MED ORDER — MIDAZOLAM HCL (PF) 5 MG/ML IJ SOLN
INTRAMUSCULAR | Status: DC | PRN
  Administered 2020-08-21: 3 mg via INTRAVENOUS
  Administered 2020-08-21 (×3): 2 mg via INTRAVENOUS
  Administered 2020-08-21: 1 mg via INTRAVENOUS

## 2020-08-21 SURGICAL SUPPLY — 96 items
ADAPTER CARDIO PERF ANTE/RETRO (ADAPTER) ×1 IMPLANT
ADH SKN CLS APL DERMABOND .7 (GAUZE/BANDAGES/DRESSINGS) ×3
ADPR PRFSN 84XANTGRD RTRGD (ADAPTER) ×3
BAG DECANTER FOR FLEXI CONT (MISCELLANEOUS) ×4 IMPLANT
BLADE CLIPPER SURG (BLADE) ×4 IMPLANT
BLADE STERNUM SYSTEM 6 (BLADE) ×4 IMPLANT
BNDG ELASTIC 4X5.8 VLCR STR LF (GAUZE/BANDAGES/DRESSINGS) ×4 IMPLANT
BNDG ELASTIC 6X5.8 VLCR STR LF (GAUZE/BANDAGES/DRESSINGS) ×4 IMPLANT
BNDG GAUZE ELAST 4 BULKY (GAUZE/BANDAGES/DRESSINGS) ×4 IMPLANT
CANISTER SUCT 3000ML PPV (MISCELLANEOUS) ×4 IMPLANT
CANNULA EZ GLIDE AORTIC 21FR (CANNULA) ×4 IMPLANT
CANNULA GUNDRY RCSP 15FR (MISCELLANEOUS) ×1 IMPLANT
CATH CPB KIT HENDRICKSON (MISCELLANEOUS) ×4 IMPLANT
CATH ROBINSON RED A/P 18FR (CATHETERS) ×5 IMPLANT
CATH THORACIC 36FR (CATHETERS) ×4 IMPLANT
CATH THORACIC 36FR RT ANG (CATHETERS) ×4 IMPLANT
CLIP VESOCCLUDE MED 24/CT (CLIP) IMPLANT
CLIP VESOCCLUDE SM WIDE 24/CT (CLIP) ×1 IMPLANT
CONTAINER PROTECT SURGISLUSH (MISCELLANEOUS) ×5 IMPLANT
DERMABOND ADVANCED (GAUZE/BANDAGES/DRESSINGS) ×1
DERMABOND ADVANCED .7 DNX12 (GAUZE/BANDAGES/DRESSINGS) IMPLANT
DRAPE CARDIOVASCULAR INCISE (DRAPES) ×4
DRAPE SRG 135X102X78XABS (DRAPES) ×3 IMPLANT
DRAPE WARM FLUID 44X44 (DRAPES) ×4 IMPLANT
DRSG COVADERM 4X14 (GAUZE/BANDAGES/DRESSINGS) ×4 IMPLANT
ELECT REM PT RETURN 9FT ADLT (ELECTROSURGICAL) ×8
ELECTRODE REM PT RTRN 9FT ADLT (ELECTROSURGICAL) ×6 IMPLANT
FELT TEFLON 1X6 (MISCELLANEOUS) ×7 IMPLANT
GAUZE SPONGE 4X4 12PLY STRL (GAUZE/BANDAGES/DRESSINGS) ×8 IMPLANT
GAUZE SPONGE 4X4 12PLY STRL LF (GAUZE/BANDAGES/DRESSINGS) ×2 IMPLANT
GLOVE SURG MICRO LTX SZ6 (GLOVE) ×4 IMPLANT
GLOVE SURG MICRO LTX SZ6.5 (GLOVE) ×3 IMPLANT
GLOVE SURG SIGNA 7.5 PF LTX (GLOVE) ×13 IMPLANT
GLOVE SURG SYN 7.5  E (GLOVE) ×8
GLOVE SURG SYN 7.5 E (GLOVE) ×6 IMPLANT
GLOVE SURG SYN 7.5 PF PI (GLOVE) IMPLANT
GOWN STRL REUS W/ TWL LRG LVL3 (GOWN DISPOSABLE) ×12 IMPLANT
GOWN STRL REUS W/ TWL XL LVL3 (GOWN DISPOSABLE) ×6 IMPLANT
GOWN STRL REUS W/TWL LRG LVL3 (GOWN DISPOSABLE) ×24
GOWN STRL REUS W/TWL XL LVL3 (GOWN DISPOSABLE) ×20
HEMOSTAT POWDER SURGIFOAM 1G (HEMOSTASIS) ×12 IMPLANT
HEMOSTAT SURGICEL 2X14 (HEMOSTASIS) ×4 IMPLANT
INSERT FOGARTY XLG (MISCELLANEOUS) IMPLANT
KIT BASIN OR (CUSTOM PROCEDURE TRAY) ×4 IMPLANT
KIT SUCTION CATH 14FR (SUCTIONS) ×8 IMPLANT
KIT TURNOVER KIT B (KITS) ×4 IMPLANT
KIT VASOVIEW HEMOPRO 2 VH 4000 (KITS) ×4 IMPLANT
MARKER GRAFT CORONARY BYPASS (MISCELLANEOUS) ×12 IMPLANT
NS IRRIG 1000ML POUR BTL (IV SOLUTION) ×19 IMPLANT
PACK E OPEN HEART (SUTURE) ×4 IMPLANT
PACK OPEN HEART (CUSTOM PROCEDURE TRAY) ×4 IMPLANT
PAD ARMBOARD 7.5X6 YLW CONV (MISCELLANEOUS) ×6 IMPLANT
PAD ELECT DEFIB RADIOL ZOLL (MISCELLANEOUS) ×4 IMPLANT
PENCIL BUTTON HOLSTER BLD 10FT (ELECTRODE) ×4 IMPLANT
POSITIONER HEAD DONUT 9IN (MISCELLANEOUS) ×4 IMPLANT
PUMP SARN DELFIN (MISCELLANEOUS) ×1 IMPLANT
PUNCH AORTIC ROTATE 4.0MM (MISCELLANEOUS) ×1 IMPLANT
PUNCH AORTIC ROTATE 4.5MM 8IN (MISCELLANEOUS) IMPLANT
PUNCH AORTIC ROTATE 5MM 8IN (MISCELLANEOUS) IMPLANT
SET MPS 3-ND DEL (MISCELLANEOUS) ×1 IMPLANT
SPONGE LAP 18X18 RF (DISPOSABLE) ×1 IMPLANT
SPONGE LAP 4X18 RFD (DISPOSABLE) ×1 IMPLANT
SUPPORT HEART JANKE-BARRON (MISCELLANEOUS) ×4 IMPLANT
SUT BONE WAX W31G (SUTURE) ×4 IMPLANT
SUT MNCRL AB 4-0 PS2 18 (SUTURE) ×1 IMPLANT
SUT PROLENE 3 0 SH DA (SUTURE) ×4 IMPLANT
SUT PROLENE 4 0 RB 1 (SUTURE) ×16
SUT PROLENE 4 0 SH DA (SUTURE) ×1 IMPLANT
SUT PROLENE 4-0 RB1 .5 CRCL 36 (SUTURE) IMPLANT
SUT PROLENE 6 0 C 1 30 (SUTURE) ×8 IMPLANT
SUT PROLENE 7 0 BV 1 (SUTURE) ×2 IMPLANT
SUT PROLENE 7 0 BV1 MDA (SUTURE) ×5 IMPLANT
SUT PROLENE 8 0 BV175 6 (SUTURE) ×2 IMPLANT
SUT STEEL 6MS V (SUTURE) ×4 IMPLANT
SUT STEEL STERNAL CCS#1 18IN (SUTURE) IMPLANT
SUT STEEL SZ 6 DBL 3X14 BALL (SUTURE) ×4 IMPLANT
SUT VIC AB 1 CTX 36 (SUTURE) ×8
SUT VIC AB 1 CTX36XBRD ANBCTR (SUTURE) ×6 IMPLANT
SUT VIC AB 2-0 CT1 27 (SUTURE) ×4
SUT VIC AB 2-0 CT1 TAPERPNT 27 (SUTURE) IMPLANT
SUT VIC AB 2-0 CTX 27 (SUTURE) IMPLANT
SUT VIC AB 3-0 SH 27 (SUTURE)
SUT VIC AB 3-0 SH 27X BRD (SUTURE) IMPLANT
SUT VIC AB 3-0 X1 27 (SUTURE) IMPLANT
SUT VICRYL 4-0 PS2 18IN ABS (SUTURE) IMPLANT
SYR 3ML LL SCALE MARK (SYRINGE) ×1 IMPLANT
SYSTEM SAHARA CHEST DRAIN ATS (WOUND CARE) ×4 IMPLANT
TAPE CLOTH SURG 4X10 WHT LF (GAUZE/BANDAGES/DRESSINGS) ×1 IMPLANT
TAPE PAPER 2X10 WHT MICROPORE (GAUZE/BANDAGES/DRESSINGS) ×1 IMPLANT
TOWEL GREEN STERILE (TOWEL DISPOSABLE) ×4 IMPLANT
TOWEL GREEN STERILE FF (TOWEL DISPOSABLE) ×4 IMPLANT
TRAY FOLEY SLVR 16FR TEMP STAT (SET/KITS/TRAYS/PACK) ×4 IMPLANT
TUBE SUCTION CARDIAC 10FR (CANNULA) ×1 IMPLANT
TUBING LAP HI FLOW INSUFFLATIO (TUBING) ×4 IMPLANT
UNDERPAD 30X36 HEAVY ABSORB (UNDERPADS AND DIAPERS) ×4 IMPLANT
WATER STERILE IRR 1000ML POUR (IV SOLUTION) ×8 IMPLANT

## 2020-08-21 NOTE — Anesthesia Procedure Notes (Signed)
Procedure Name: Intubation Date/Time: 08/21/2020 8:02 AM Performed by: Imagene Riches, CRNA Pre-anesthesia Checklist: Patient identified, Emergency Drugs available, Suction available and Patient being monitored Patient Re-evaluated:Patient Re-evaluated prior to induction Oxygen Delivery Method: Circle System Utilized Preoxygenation: Pre-oxygenation with 100% oxygen Induction Type: IV induction Ventilation: Mask ventilation without difficulty and Oral airway inserted - appropriate to patient size Laryngoscope Size: Sabra Heck and 2 Grade View: Grade I Tube type: Oral Tube size: 8.0 mm Number of attempts: 1 Airway Equipment and Method: Stylet and Oral airway Placement Confirmation: ETT inserted through vocal cords under direct vision,  positive ETCO2 and breath sounds checked- equal and bilateral Secured at: 24 cm Tube secured with: Tape Dental Injury: Teeth and Oropharynx as per pre-operative assessment

## 2020-08-21 NOTE — Procedures (Signed)
Extubation Procedure Note  Patient Details:   Name: VERSHAWN WESTRUP DOB: 11/30/1955 MRN: [id number]   Airway Documentation:  Airway 8 mm (Active)  Secured at (cm) 24 cm 08/21/20 2025  Measured From Lips 08/21/20 2025  Secured Location Right 08/21/20 2025  Secured By [employer] Tape 08/21/20 2025  Prone position No 08/21/20 2025  Cuff Pressure (cm H2O) Green OR 18-26 CmH2O 08/21/20 1950  Site Condition Dry 08/21/20 2025   Vent end date: (not recorded) Vent end time: (not recorded)   Evaluation  O2 sats: stable throughout Complications: No apparent complications Patient did tolerate procedure well. Bilateral Breath Sounds: Clear,Diminished   Yes  Graciella Freer 08/21/2020, 9:05 PM    Pt extubated per rapid wean protocol/per MD. Pt able to perform NIF of -22 and VC of 691ml. Pt had positive cuff leak and is currently on 4 L [place].

## 2020-08-21 NOTE — Anesthesia Procedure Notes (Signed)
Central Venous Catheter Insertion Performed by: Audry Pili, MD, anesthesiologist Start/End5/25/2022 7:20 AM, 08/21/2020 7:22 AM Patient location: Pre-op. Preanesthetic checklist: patient identified, IV checked, risks and benefits discussed, surgical consent, monitors and equipment checked, pre-op evaluation, timeout performed and anesthesia consent Position: Trendelenburg Hand hygiene performed  and maximum sterile barriers used  Total catheter length 10. PA cath was placed.Swan type:thermodilution PA Cath depth:52 Procedure performed without using ultrasound guided technique. Attempts: 1 Patient tolerated the procedure well with no immediate complications.

## 2020-08-21 NOTE — Plan of Care (Signed)
  Problem: Education: Goal: Will demonstrate proper wound care and an understanding of methods to prevent future damage Outcome: Progressing Goal: Knowledge of disease or condition will improve Outcome: Progressing Goal: Knowledge of the prescribed therapeutic regimen will improve Outcome: Progressing Goal: Individualized Educational Video(s) Outcome: Progressing   Problem: Activity: Goal: Risk for activity intolerance will decrease Outcome: Progressing   Problem: Cardiac: Goal: Will achieve and/or maintain hemodynamic stability Outcome: Progressing   Problem: Clinical Measurements: Goal: Postoperative complications will be avoided or minimized Outcome: Progressing   Problem: Respiratory: Goal: Respiratory status will improve Outcome: Completed/Met   Problem: Skin Integrity: Goal: Wound healing without signs and symptoms of infection Outcome: Progressing Goal: Risk for impaired skin integrity will decrease Outcome: Progressing   Problem: Urinary Elimination: Goal: Ability to achieve and maintain adequate renal perfusion and functioning will improve Outcome: Progressing

## 2020-08-21 NOTE — Brief Op Note (Addendum)
08/18/2020 - 08/21/2020  11:55 AM  PATIENT:  [name] [name]  65 y.o. male  PRE-OPERATIVE DIAGNOSIS:  CORONARY ARTERY DISEASE  POST-OPERATIVE DIAGNOSIS:  CORONARY ARTERY DISEASE  PROCEDURE:  CORONARY ARTERY BYPASS GRAFTING (CABG) X 4, USING LEFT INTERNAL MAMMARY ARTERY AND RIGHT GREATER SAPHENOUS VEIN HARVESTED ENDOSCOPICALLY.   SVG TO OM1 and OM3 SEQUENTIALLY   SVG TO DIAG.   LIMA TO LAD  TRANSESOPHAGEAL ECHOCARDIOGRAM  ENDOVEIN HARVEST OF GREATER SAPHENOUS VEIN (Right) Vein harvest time 12min     Vein prep time 55min  SURGEON: Melrose Nakayama, MD - Primary  PHYSICIAN ASSISTANT: Roddenberry  ASSISTANTS: Staff RNFA   ANESTHESIA:   general  EBL:  Per anesthesia and perfusion records   BLOOD ADMINISTERED:none  DRAINS: Bilateral pleural and mediastinal drains   LOCAL MEDICATIONS USED:  NONE  SPECIMEN:  No Specimen  DISPOSITION OF SPECIMEN:  N/A  COUNTS:  Correct  DICTATION: .Dragon Dictation  PLAN OF CARE: Admit to inpatient   PATIENT DISPOSITION:  ICU - intubated and hemodynamically stable.   Delay start of Pharmacological VTE agent (>24hrs) due to surgical blood loss or risk of bleeding: yes

## 2020-08-21 NOTE — Progress Notes (Signed)
ABG sample obtained at the end of rapid wean protocol 7.28/ 50/ 87/23.8 . Placed pt back on PRVC/ SIMV/ PS per protocol.

## 2020-08-21 NOTE — Anesthesia Procedure Notes (Signed)
Central Venous Catheter Insertion Performed by: Audry Pili, MD, anesthesiologist Start/End5/25/2022 7:11 AM, 08/21/2020 7:20 AM Patient location: Pre-op. Preanesthetic checklist: patient identified, IV checked, risks and benefits discussed, surgical consent, monitors and equipment checked, pre-op evaluation, timeout performed and anesthesia consent Position: Trendelenburg Lidocaine 1% used for infiltration and patient sedated Hand hygiene performed , maximum sterile barriers used  and Seldinger technique used Catheter size: 8.5 Fr Central line was placed.MAC introducer Procedure performed using ultrasound guided technique. Ultrasound Notes:anatomy identified, needle tip was noted to be adjacent to the nerve/plexus identified, no ultrasound evidence of intravascular and/or intraneural injection and image(s) printed for medical record Attempts: 1 Following insertion, line sutured, dressing applied and Biopatch. Post procedure assessment: blood return through all ports, no air and free fluid flow  Patient tolerated the procedure well with no immediate complications.

## 2020-08-21 NOTE — Progress Notes (Signed)
Pt was placed on PS/CPAP per rapid wean protocol. Pt started to become asynchronous with the ventilator quickly after change and pt vagaled down with a very short period of asystole (9 seconds). RN and RT at bedside. Pt quickly responded and opening eyes after RN stimulated the pt by a chest rub. Pt failed rapid wean protocol x2 and placed back on full support.

## 2020-08-21 NOTE — Interval H&P Note (Signed)
History and Physical Interval Note:  08/21/2020 7:23 AM  [name] [name]  has presented today for surgery, with the diagnosis of CAD.  The various methods of treatment have been discussed with the patient and family. After consideration of risks, benefits and other options for treatment, the patient has consented to  Procedure(s): CORONARY ARTERY BYPASS GRAFTING (CABG) (N/A) TRANSESOPHAGEAL ECHOCARDIOGRAM (TEE) (N/A) as a surgical intervention.  The patient's history has been reviewed, patient examined, no change in status, stable for surgery.  I have reviewed the patient's chart and labs.  Questions were answered to the patient's satisfaction.     Melrose Nakayama

## 2020-08-21 NOTE — Progress Notes (Signed)
At approximately 1830 this RN responded to bedside with RT. Pt had become asynchronous with Vent after being placed on PS/CPAP. Pt gagged on ETT and pt vagaled down HR asystole refer to cariac monitor. Pt responded to painful stimulation and vitals recovered. Pt stable. On call MD notified

## 2020-08-21 NOTE — Anesthesia Postprocedure Evaluation (Signed)
Anesthesia Post Note  Patient: [name] [name]  Procedure(s) Performed: CORONARY ARTERY BYPASS GRAFTING (CABG) X 4, USING LEFT INTERNAL MAMMARY ARTERY AND RIGHT GREATER SAPHENOUS VEIN HARVESTED ENDOSCOPICALLY. SVG TO OM1, OM3 SEQUENTIALLY, SVG TO DIAG., LIMA TO LAD (N/A Chest) TRANSESOPHAGEAL ECHOCARDIOGRAM (TEE) (N/A ) ENDOVEIN HARVEST OF GREATER SAPHENOUS VEIN (Right )     Patient location during evaluation: ICU Anesthesia Type: General Level of consciousness: sedated and patient remains intubated per anesthesia plan Pain management: pain level controlled Vital Signs Assessment: post-procedure vital signs reviewed and stable Respiratory status: patient remains intubated per anesthesia plan Cardiovascular status: stable Postop Assessment: no apparent nausea or vomiting Anesthetic complications: no   No complications documented.  Last Vitals:  Vitals:   08/21/20 0718 08/21/20 1330  BP:  (!) 90/44  Pulse: 78 82  Resp: 12 12  Temp:    SpO2: 97% 92%    Last Pain:  Vitals:   08/21/20 0420  TempSrc: Oral  PainSc: 0-No pain                 Audry Pili

## 2020-08-21 NOTE — Transfer of Care (Signed)
Immediate Anesthesia Transfer of Care Note  Patient: [name] [name]  Procedure(s) Performed: CORONARY ARTERY BYPASS GRAFTING (CABG) X 4, USING LEFT INTERNAL MAMMARY ARTERY AND RIGHT GREATER SAPHENOUS VEIN HARVESTED ENDOSCOPICALLY. SVG TO OM1, OM3 SEQUENTIALLY, SVG TO DIAG., LIMA TO LAD (N/A Chest) TRANSESOPHAGEAL ECHOCARDIOGRAM (TEE) (N/A ) ENDOVEIN HARVEST OF GREATER SAPHENOUS VEIN (Right )  Patient Location: ICU  Anesthesia Type:General  Level of Consciousness: sedated  Airway & Oxygen Therapy: Patient remains intubated per anesthesia plan and Patient placed on Ventilator (see vital sign flow sheet for setting)  Post-op Assessment: Report given to RN and Post -op Vital signs reviewed and stable  Post vital signs: Reviewed and stable  Last Vitals:  Vitals Value Taken Time  BP 90/44 08/21/20 1330  Temp    Pulse 82 08/21/20 1330  Resp 12 08/21/20 1330  SpO2 92 % 08/21/20 1330    Last Pain:  Vitals:   08/21/20 0420  TempSrc: Oral  PainSc: 0-No pain      Patients Stated Pain Goal: 0 (69/24/93 2419)  Complications: No complications documented.

## 2020-08-21 NOTE — Progress Notes (Signed)
TCTS Evening Rounds DOS s/p CABg Likely vagal-related pause during vent wean Now a-pacing, HD stable Following commands and is cooperative BP 121/71   Pulse 88   Temp 98.6 F (37 C)   Resp 16   Ht 5\' 9"  (1.753 m)   Wt 107.3 kg   SpO2 100%   BMI 34.93 kg/m  CTA RRR Dressing dry   Intake/Output Summary (Last 24 hours) at 08/21/2020 2001 Last data filed at 08/21/2020 1800 Gross per 24 hour  Intake 4306.94 ml  Output 3090 ml  Net 1216.94 ml   A/P: extubate Continue early post operative care Adenike Shidler Z. Orvan Seen, [place]

## 2020-08-21 NOTE — Progress Notes (Signed)
  Echocardiogram Echocardiogram Transesophageal has been performed.  [name] [name] 08/21/2020, 8:48 AM

## 2020-08-21 NOTE — Anesthesia Procedure Notes (Signed)
Arterial Line Insertion Start/End5/25/2022 7:00 AM, 08/21/2020 7:15 AM Performed by: Imagene Riches, CRNA, CRNA  Preanesthetic checklist: patient identified, IV checked, site marked, risks and benefits discussed, surgical consent, monitors and equipment checked, pre-op evaluation, timeout performed and anesthesia consent Left, radial was placed Catheter size: 20 G Hand hygiene performed , maximum sterile barriers used  and Seldinger technique used Allen's test indicative of satisfactory collateral circulation Attempts: 1 Procedure performed without using ultrasound guided technique. Following insertion, dressing applied and Biopatch. Post procedure assessment: normal  Patient tolerated the procedure well with no immediate complications.

## 2020-08-22 ENCOUNTER — Encounter (HOSPITAL_COMMUNITY): Payer: Self-pay | Admitting: Thoracic Surgery (Cardiothoracic Vascular Surgery)

## 2020-08-22 ENCOUNTER — Inpatient Hospital Stay (HOSPITAL_COMMUNITY): Payer: Medicare PPO

## 2020-08-22 LAB — CBC
HCT: 32.8 % — ABNORMAL LOW (ref 39.0–52.0)
HCT: 32.8 % — ABNORMAL LOW (ref 39.0–52.0)
Hemoglobin: 10.7 g/dL — ABNORMAL LOW (ref 13.0–17.0)
Hemoglobin: 10.5 g/dL — ABNORMAL LOW (ref 13.0–17.0)
MCH: 28.5 pg (ref 26.0–34.0)
MCH: 28.1 pg (ref 26.0–34.0)
MCHC: 32.6 g/dL (ref 30.0–36.0)
MCHC: 32 g/dL (ref 30.0–36.0)
MCV: 87.5 fL (ref 80.0–100.0)
MCV: 87.7 fL (ref 80.0–100.0)
Platelets: 210 10*3/uL (ref 150–400)
Platelets: 218 10*3/uL (ref 150–400)
RBC: 3.75 MIL/uL — ABNORMAL LOW (ref 4.22–5.81)
RBC: 3.74 MIL/uL — ABNORMAL LOW (ref 4.22–5.81)
RDW: 14.8 % (ref 11.5–15.5)
RDW: 15 % (ref 11.5–15.5)
WBC: 10.3 10*3/uL (ref 4.0–10.5)
WBC: 10.4 10*3/uL (ref 4.0–10.5)
nRBC: 0 % (ref 0.0–0.2)
nRBC: 0 % (ref 0.0–0.2)

## 2020-08-22 LAB — GLUCOSE, CAPILLARY
Glucose-Capillary: 129 mg/dL — ABNORMAL HIGH (ref 70–99)
Glucose-Capillary: 123 mg/dL — ABNORMAL HIGH (ref 70–99)
Glucose-Capillary: 124 mg/dL — ABNORMAL HIGH (ref 70–99)
Glucose-Capillary: 126 mg/dL — ABNORMAL HIGH (ref 70–99)
Glucose-Capillary: 142 mg/dL — ABNORMAL HIGH (ref 70–99)
Glucose-Capillary: 131 mg/dL — ABNORMAL HIGH (ref 70–99)
Glucose-Capillary: 171 mg/dL — ABNORMAL HIGH (ref 70–99)
Glucose-Capillary: 161 mg/dL — ABNORMAL HIGH (ref 70–99)
Glucose-Capillary: 193 mg/dL — ABNORMAL HIGH (ref 70–99)
Glucose-Capillary: 238 mg/dL — ABNORMAL HIGH (ref 70–99)

## 2020-08-22 LAB — BASIC METABOLIC PANEL
Anion gap: 6 (ref 5–15)
Anion gap: 8 (ref 5–15)
BUN: 15 mg/dL (ref 8–23)
BUN: 16 mg/dL (ref 8–23)
CO2: 23 mmol/L (ref 22–32)
CO2: 25 mmol/L (ref 22–32)
Calcium: 8.2 mg/dL — ABNORMAL LOW (ref 8.9–10.3)
Calcium: 8.2 mg/dL — ABNORMAL LOW (ref 8.9–10.3)
Chloride: 104 mmol/L (ref 98–111)
Chloride: 101 mmol/L (ref 98–111)
Creatinine, Ser: 0.85 mg/dL (ref 0.61–1.24)
Creatinine, Ser: 0.99 mg/dL (ref 0.61–1.24)
GFR, Estimated: >60 mL/min (ref >60)
GFR, Estimated: >60 mL/min (ref >60)
Glucose, Bld: 117 mg/dL — ABNORMAL HIGH (ref 70–99)
Glucose, Bld: 204 mg/dL — ABNORMAL HIGH (ref 70–99)
Potassium: 5.1 mmol/L (ref 3.5–5.1)
Potassium: 4.6 mmol/L (ref 3.5–5.1)
Sodium: 133 mmol/L — ABNORMAL LOW (ref 135–145)
Sodium: 134 mmol/L — ABNORMAL LOW (ref 135–145)

## 2020-08-22 LAB — MAGNESIUM
Magnesium: 2.3 mg/dL (ref 1.7–2.4)
Magnesium: 2 mg/dL (ref 1.7–2.4)

## 2020-08-22 MED ORDER — INSULIN DETEMIR 100 UNIT/ML ~~LOC~~ SOLN
30.0000 [IU] | Freq: Two times a day (BID) | SUBCUTANEOUS | Status: DC
  Administered 2020-08-22 (×2): 30 [IU] via SUBCUTANEOUS
  Filled 2020-08-22 (×4): qty 0.3

## 2020-08-22 MED ORDER — ENOXAPARIN SODIUM 40 MG/0.4ML IJ SOSY
40.0000 mg | PREFILLED_SYRINGE | Freq: Every day | INTRAMUSCULAR | Status: DC
  Administered 2020-08-22 – 2020-08-25 (×4): 40 mg via SUBCUTANEOUS
  Filled 2020-08-22 (×4): qty 0.4

## 2020-08-22 MED ORDER — CARVEDILOL 12.5 MG PO TABS
12.5000 mg | ORAL_TABLET | Freq: Two times a day (BID) | ORAL | Status: DC
  Administered 2020-08-22 (×2): 12.5 mg via ORAL
  Filled 2020-08-22 (×2): qty 1

## 2020-08-22 MED ORDER — INSULIN ASPART 100 UNIT/ML IJ SOLN
0.0000 [IU] | INTRAMUSCULAR | Status: DC
Start: 2020-08-23 — End: 2020-08-23

## 2020-08-22 MED ORDER — FUROSEMIDE 10 MG/ML IJ SOLN
40.0000 mg | Freq: Once | INTRAMUSCULAR | Status: AC
  Administered 2020-08-22: 40 mg via INTRAVENOUS
  Filled 2020-08-22: qty 4

## 2020-08-22 MED ORDER — INSULIN ASPART 100 UNIT/ML IJ SOLN
0.0000 [IU] | INTRAMUSCULAR | Status: DC
  Administered 2020-08-22 (×3): 4 [IU] via SUBCUTANEOUS

## 2020-08-22 MED ORDER — TAMSULOSIN HCL 0.4 MG PO CAPS
0.4000 mg | ORAL_CAPSULE | Freq: Every day | ORAL | Status: DC
  Administered 2020-08-22 – 2020-08-25 (×4): 0.4 mg via ORAL
  Filled 2020-08-22 (×4): qty 1

## 2020-08-22 MED ORDER — MELATONIN 3 MG PO TABS
3.0000 mg | ORAL_TABLET | Freq: Every day | ORAL | Status: DC
  Administered 2020-08-22 – 2020-08-25 (×4): 3 mg via ORAL
  Filled 2020-08-22 (×4): qty 1

## 2020-08-22 MED ORDER — FUROSEMIDE 10 MG/ML IJ SOLN
40.0000 mg | Freq: Once | INTRAMUSCULAR | Status: DC
  Filled 2020-08-22: qty 4

## 2020-08-22 NOTE — Plan of Care (Signed)

## 2020-08-22 NOTE — Progress Notes (Signed)
1 Day Post-Op Procedure(s) (LRB): CORONARY ARTERY BYPASS GRAFTING (CABG) X 4, USING LEFT INTERNAL MAMMARY ARTERY AND RIGHT GREATER SAPHENOUS VEIN HARVESTED ENDOSCOPICALLY. SVG TO OM1, OM3 SEQUENTIALLY, SVG TO DIAG., LIMA TO LAD (N/A) TRANSESOPHAGEAL ECHOCARDIOGRAM (TEE) (N/A) ENDOVEIN HARVEST OF GREATER SAPHENOUS VEIN (Right) Subjective: C/o incisional pain  Objective: Vital signs in last 24 hours: Temp:  [95.4 F (35.2 C)-99.68 F (37.6 C)] 98.06 F (36.7 C) (05/26 0700) Pulse Rate:  [76-98] 92 (05/26 0700) Cardiac Rhythm: Atrial paced (05/26 0400) Resp:  [10-21] 13 (05/26 0700) BP: (90-130)/(30-83) 122/74 (05/26 0700) SpO2:  [88 %-100 %] 99 % (05/26 0700) Arterial Line BP: (105-149)/(52-83) 129/60 (05/26 0700) FiO2 (%):  [40 %-50 %] 40 % (05/25 2025) Weight:  [111.6 kg] 111.6 kg (05/26 0600)  Hemodynamic parameters for last 24 hours: PAP: (25-47)/(11-31) 33/19 CO:  [3.3 L/min-5.1 L/min] 4.6 L/min CI:  [1.5 L/min/m2-2.3 L/min/m2] 2.1 L/min/m2  Intake/Output from previous day: 05/25 0701 - 05/26 0700 In: 4250.5 [I.V.:2936.7; Blood:480; IV Piggyback:833.8] Out: 4128 [Urine:3375; Chest Tube:650] Intake/Output this shift: No intake/output data recorded.  General appearance: alert, cooperative and no distress Neurologic: intact Heart: regular rate and rhythm Lungs: diminished breath sounds bibasilar Abdomen: nontender, hypoactive BS  Lab Results: Recent Labs    08/21/20 1915 08/21/20 1923 08/21/20 2200 08/22/20 0428  WBC 9.0  --   --  10.3  HGB 11.0*   < > 10.5* 10.7*  HCT 34.0*   < > 31.0* 32.8*  PLT 180  --   --  210   < > = values in this interval not displayed.   BMET:  Recent Labs    08/21/20 1915 08/21/20 1923 08/21/20 2200 08/22/20 0428  NA 134*   < > 137 133*  K 5.2*   < > 5.2* 5.1  CL 105  --   --  104  CO2 23  --   --  23  GLUCOSE 94  --   --  117*  BUN 15  --   --  15  CREATININE 0.86  --   --  0.85  CALCIUM 8.5*  --   --  8.2*   < > = values  in this interval not displayed.    PT/INR:  Recent Labs    08/21/20 1356  LABPROT 15.8*  INR 1.3*   ABG    Component Value Date/Time   PHART 7.364 08/21/2020 2200   HCO3 22.8 08/21/2020 2200   TCO2 24 08/21/2020 2200   ACIDBASEDEF 2.0 08/21/2020 2200   O2SAT 97.0 08/21/2020 2200   CBG (last 3)  Recent Labs    08/22/20 0216 08/22/20 0417 08/22/20 0600  GLUCAP 123* 126* 124*    Assessment/Plan: S/P Procedure(s) (LRB): CORONARY ARTERY BYPASS GRAFTING (CABG) X 4, USING LEFT INTERNAL MAMMARY ARTERY AND RIGHT GREATER SAPHENOUS VEIN HARVESTED ENDOSCOPICALLY. SVG TO OM1, OM3 SEQUENTIALLY, SVG TO DIAG., LIMA TO LAD (N/A) TRANSESOPHAGEAL ECHOCARDIOGRAM (TEE) (N/A) ENDOVEIN HARVEST OF GREATER SAPHENOUS VEIN (Right) -POD # 1 NEURO- intact CV- good hemodynamics- dc Swan and  A line  ASA, Coreg, resume Brillinta prior to DC  Diffusely diseased coronaries- statin RESP- left lower lobe atelectasis- IS RENAL- creatinine and lytes ok  Diurese ENDO - CBG well controlled with insulin drip   Transition to levemir + SSI HEME- anemia secondary to ABL- mild, follow SCD + enoxaparin for DVT prophylaxis Ambulate Dc chest tubes   LOS: 3 days    [name] [name] 08/22/2020

## 2020-08-22 NOTE — Op Note (Signed)
NAME: [name], [name] MEDICAL RECORD NO: [id number] ACCOUNT NO: [id number] DATE OF BIRTH: 1955/08/24 FACILITY: [facility] LOCATION: [facility] PHYSICIAN: Revonda Standard. Roxan Hockey, MD  Operative Report   DATE OF PROCEDURE: 08/21/2020  PREOPERATIVE DIAGNOSIS:  Three-vessel coronary artery disease.  POSTOPERATIVE DIAGNOSIS:  Three-vessel coronary artery disease.  PROCEDURES PERFORMED:   Median sternotomy, extracorporeal circulation, Coronary artery bypass grafting x 4  Left internal mammary artery to LAD,  Saphenous vein graft to diagonal,  Sequential saphenous vein graft to obtuse marginal 1 and 3 Endoscopic vein harvest right thigh.  SURGEON:  Revonda Standard. Roxan Hockey, MD   ASSISTANT: Enid Cutter, PA.  ANESTHESIA:  General.  FINDINGS:  Poor quality target vessels.  Diffusely diseased OM2, not graftable.  Good quality mammary.  Fair quality vein.  Transesophageal echocardiography showed overall preserved left ventricular systolic function.  There was mild aortic insufficiency.  CLINICAL NOTE: [name] [name] is a 65 year old man with a longstanding history of coronary artery disease.  He has had multiple percutaneous interventions.  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  He was noted to have small diffusely diseased target vessels.  He was offered the option of coronary artery bypass grafting.  The indications, risks, benefits, alternatives, and limitations were discussed in detail with the patient.  He understood  and accepted the risks and agreed to proceed.  OPERATIVE NOTE:  [name] [name] was brought to the preoperative holding area on 08/21/2020.  Anesthesia placed a Swan-Ganz catheter and arterial blood pressure monitoring line.  He was taken to the Operating Room, anesthetized and intubated.  A Foley catheter was placed.  Intravenous antibiotics were administered.  Transesophageal echocardiography was performed, findings as noted  above.  Please see the anesthesiologist's dictated note for full details.  The chest, abdomen and legs were prepped and draped in the usual sterile fashion.  A timeout was performed.  Median sternotomy was performed and the left internal mammary artery was harvested under direct vision.  Simultaneously, an incision was made in the medial aspect of the right leg just below the knee and the greater saphenous vein  was harvested from the right thigh endoscopically.  The mammary was good quality.  The vein was fair quality.  2000 units of heparin was administered during the vessel harvest.  The remainder of the full heparin dose was given prior to opening the  pericardium.  After harvesting the conduits the pericardium was opened.  The ascending aorta was inspected.  There was no evidence of atherosclerotic disease.  The aorta was cannulated via concentric 2-0 Ethibond pledgeted pursestring sutures.  A dual stage venous  cannula was placed via pursestring suture in the right atrial appendage.  Cardiopulmonary bypass was initiated.  Flows were maintained per protocol.  The patient was cooled to 32 degrees Celsius.  Anticoagulation was monitored with ACT measurement.  The  coronary arteries were inspected and anastomotic sites were chosen.  Of note, the acute marginal was very small and not suitable for grafting, OM2 was too small to graft, OM1 was marginal, but felt that it could be grafted.  All the coronary arteries  were diffusely diseased, heavily calcified vessels.  The conduits were inspected and cut to length.  A foam pad was placed in the pericardium.  A temperature probe was placed in the myocardial septum and a cardioplegia cannula was placed in the  ascending aorta.  The aorta was cross clamped.  The left ventricle was emptied via the aortic root vent.  Cardiac arrest then was achieved with a combination of cold antegrade blood cardioplegia and topical iced saline.  Two liters of cardioplegia was  administered.  There  was a rapid diastolic arrest.  There was relatively slow septal coiling, but ultimately the septum did cool to 11 degrees Celsius.  The heart was elevated.  A reversed saphenous vein graft was placed sequentially to obtuse marginals 1 and 3.  Obtuse marginal 1 was a diffusely diseased vessel.  It did accept a 1 mm probe.  It was poor quality.  The vein was of fair quality.  A  side-to-side anastomosis was performed with a running 7-0 Prolene suture.  A probe did pass distally at the completion of the anastomosis.  The distal end of the vein was beveled and anastomosed to OM3 distally.  This was a large dominant lateral branch.  It was  diffusely diseased.  It was a 1.5 mm vessel at the site of the anastomosis, but only a 1 mm probe would pass distally as there was a bifurcation just beyond the anastomosis.  The end-to-side anastomosis was performed with a running 7-0 Prolene suture.   Again, a probe did pass proximally and distally at the completion of the anastomosis.  Cardioplegia was administered down the graft.  There was good flow.  There was good hemostasis at both sites.  Additional cardioplegia was administered down the aortic root.  Next, the heart was elevated exposing the anterior wall.  There was a relatively large diffusely diseased diagonal vessel that was felt to be graftable.  This vessel was opened.  There was a  diffuse calcific plaque throughout the vessel, but a 1 mm probe did pass distally.  The probe did not pass proximally.  The vein was of fair quality.  It was anastomosed end-to-side with a running 7-0 Prolene suture.  A probe did pass distally at the  completion of the anastomosis.  Cardioplegia was administered and there was a satisfactory flow and good hemostasis.  Next, the left internal mammary artery was brought through a window in the pericardium.  The distal end was bevelled.  It was anastomosed end-to-side to the distal LAD.  The LAD was diffusely  diseased, heavily calcified vessel, a 1.5 mm probe did pass  proximally from the anastomosis and a 1 mm probe passed distally.  The mammary was a good quality vessel.  It was anastomosed end-to-side with a running 8-0 Prolene suture.  At the completion of the mammary to LAD anastomosis, the bulldog clamp was removed.   There was good hemostasis.  Rapid septal rewarming was noted.  The bulldog clamp was replaced and the mammary pedicle was tacked to the epicardial surface of the heart with 6-0 Prolene sutures.  Additional cardioplegia was administered.  The vein graft to the OMs was cut to length.  The cardioplegia cannula was removed from the ascending aorta and the proximal anastomosis was performed to a 4 mm punch aortotomy with a running 6-0 Prolene suture.   At the completion of the proximal anastomosis, the patient was placed in Trendelenburg position.  Lidocaine was administered.  The aortic root was deaired.  The bulldog clamp was again removed from the left mammary artery and the aortic crossclamp was  removed.  The total crossclamp time was 79 minutes.  The patient initially had a bradycardic rhythm.  Bulldog clamps were placed proximally and distally on the OM vein and a longitudinal venotomy was made.  The proximal anastomosis for the diagonal vein  was taken off this as a Y graft in an end-to-side fashion.  The anastomosis was performed with a running 7-0 Prolene suture.  All proximal and distal anastomoses were then reinspected for hemostasis.  Epicardial pacing wires were placed on the right  ventricle and right atrium and DDD pacing was initiated.  When the patient had rewarmed to a core temperature of 37 degrees Celsius, he was weaned from cardiopulmonary bypass on the first attempt.  He was DDD paced for rate and on a low dose epinephrine  drip at the time of separation from bypass.  Total bypass time was 130 minutes.  Initial cardiac index was 1.7 liters per minute per meter squared.  That  improved with volume resuscitation.  Post-bypass transesophageal echocardiography was unchanged from  the pre-bypass study other than some septal dyskinesis due to pacing.  A test dose of protamine was administered and was well tolerated.  The atrial and aortic cannula were removed.  The remainder of the protamine was administered without incident.  The  chest was irrigated with warm saline.  Hemostasis was achieved.  The pericardium was not closed.  Left pleural and mediastinal chest tubes were placed through separate subcostal incisions.  The sternum was closed with a combination of single and double  heavy gauge stainless steel wires.  The pectoralis fascia, subcutaneous tissue and skin were closed in standard fashion.  All sponge, needle and instrument counts were correct at the end of the procedure, the patient was taken from the operating room to  the surgical intensive care unit, intubated and in good condition.   SUJ D: 08/21/2020 5:50:51 pm T: 08/22/2020 12:52:00 am  JOB: [id number]/ [id number]

## 2020-08-22 NOTE — Progress Notes (Signed)
Patient ID: [name] [name], male   DOB: [birth year], 65 y.o.   MRN: [id number]  TCTS Evening Rounds:   Hemodynamically stable   Urine output good    CBC    Component Value Date/Time   WBC 10.4 08/22/2020 1632   RBC 3.74 (L) 08/22/2020 1632   HGB 10.5 (L) 08/22/2020 1632   HGB 13.2 08/05/2020 1147   HCT 32.8 (L) 08/22/2020 1632   HCT 39.8 08/05/2020 1147   PLT 218 08/22/2020 1632   PLT 263 08/05/2020 1147   MCV 87.7 08/22/2020 1632   MCV 84 08/05/2020 1147   MCH 28.1 08/22/2020 1632   MCHC 32.0 08/22/2020 1632   RDW 15.0 08/22/2020 1632   RDW 14.3 08/05/2020 1147   LYMPHSABS 1.6 08/18/2020 2139   LYMPHSABS 1.4 08/05/2020 1147   MONOABS 0.8 08/18/2020 2139   EOSABS 0.1 08/18/2020 2139   EOSABS 0.2 08/05/2020 1147   BASOSABS 0.0 08/18/2020 2139   BASOSABS 0.0 08/05/2020 1147     BMET    Component Value Date/Time   NA 134 (L) 08/22/2020 1632   NA 136 08/05/2020 1147   K 4.6 08/22/2020 1632   CL 101 08/22/2020 1632   CO2 25 08/22/2020 1632   GLUCOSE 204 (H) 08/22/2020 1632   BUN 16 08/22/2020 1632   BUN 18 08/05/2020 1147   CREATININE 0.99 08/22/2020 1632   CREATININE 0.93 11/26/2010 1540   CALCIUM 8.2 (L) 08/22/2020 1632   GFRNONAA >60 08/22/2020 1632   GFRAA 83 05/06/2020 1125     A/P:  Stable postop course. Continue current plans

## 2020-08-22 NOTE — Addendum Note (Signed)
Addendum  created 08/22/20 7680 by Josephine Igo, CRNA   Order list changed, Pharmacy for encounter modified

## 2020-08-23 ENCOUNTER — Encounter (HOSPITAL_COMMUNITY): Payer: Medicare PPO

## 2020-08-23 ENCOUNTER — Inpatient Hospital Stay (HOSPITAL_COMMUNITY): Payer: Medicare PPO

## 2020-08-23 LAB — GLUCOSE, CAPILLARY
Glucose-Capillary: 283 mg/dL — ABNORMAL HIGH (ref 70–99)
Glucose-Capillary: 292 mg/dL — ABNORMAL HIGH (ref 70–99)
Glucose-Capillary: 301 mg/dL — ABNORMAL HIGH (ref 70–99)
Glucose-Capillary: 256 mg/dL — ABNORMAL HIGH (ref 70–99)

## 2020-08-23 LAB — BASIC METABOLIC PANEL
Anion gap: 8 (ref 5–15)
BUN: 19 mg/dL (ref 8–23)
CO2: 28 mmol/L (ref 22–32)
Calcium: 8.2 mg/dL — ABNORMAL LOW (ref 8.9–10.3)
Chloride: 97 mmol/L — ABNORMAL LOW (ref 98–111)
Creatinine, Ser: 1.05 mg/dL (ref 0.61–1.24)
GFR, Estimated: >60 mL/min (ref >60)
Glucose, Bld: 246 mg/dL — ABNORMAL HIGH (ref 70–99)
Potassium: 4.5 mmol/L (ref 3.5–5.1)
Sodium: 133 mmol/L — ABNORMAL LOW (ref 135–145)

## 2020-08-23 LAB — CBC
HCT: 32.4 % — ABNORMAL LOW (ref 39.0–52.0)
Hemoglobin: 10.2 g/dL — ABNORMAL LOW (ref 13.0–17.0)
MCH: 27.9 pg (ref 26.0–34.0)
MCHC: 31.5 g/dL (ref 30.0–36.0)
MCV: 88.8 fL (ref 80.0–100.0)
Platelets: 205 10*3/uL (ref 150–400)
RBC: 3.65 MIL/uL — ABNORMAL LOW (ref 4.22–5.81)
RDW: 15.2 % (ref 11.5–15.5)
WBC: 11.8 10*3/uL — ABNORMAL HIGH (ref 4.0–10.5)
nRBC: 0 % (ref 0.0–0.2)

## 2020-08-23 MED ORDER — SODIUM CHLORIDE 0.9% FLUSH: 3.0000 mL | INTRAVENOUS | Status: DC | PRN

## 2020-08-23 MED ORDER — CARVEDILOL 6.25 MG PO TABS
18.7500 mg | ORAL_TABLET | Freq: Two times a day (BID) | ORAL | Status: DC
  Administered 2020-08-23 – 2020-08-26 (×7): 18.75 mg via ORAL
  Filled 2020-08-23 (×7): qty 1

## 2020-08-23 MED ORDER — INSULIN DETEMIR 100 UNIT/ML ~~LOC~~ SOLN
45.0000 [IU] | Freq: Two times a day (BID) | SUBCUTANEOUS | Status: DC
  Administered 2020-08-23 (×2): 45 [IU] via SUBCUTANEOUS
  Filled 2020-08-23 (×5): qty 0.45

## 2020-08-23 MED ORDER — MAGNESIUM HYDROXIDE 400 MG/5ML PO SUSP: 30.0000 mL | Freq: Every day | ORAL | Status: DC | PRN

## 2020-08-23 MED ORDER — ALUM & MAG HYDROXIDE-SIMETH 200-200-20 MG/5ML PO SUSP: 15.0000 mL | Freq: Four times a day (QID) | ORAL | Status: DC | PRN

## 2020-08-23 MED ORDER — INSULIN ASPART 100 UNIT/ML IJ SOLN
0.0000 [IU] | Freq: Three times a day (TID) | INTRAMUSCULAR | Status: DC
  Administered 2020-08-23: 16 [IU] via SUBCUTANEOUS
  Administered 2020-08-23 – 2020-08-24 (×4): 12 [IU] via SUBCUTANEOUS
  Administered 2020-08-24: 8 [IU] via SUBCUTANEOUS
  Administered 2020-08-24: 4 [IU] via SUBCUTANEOUS
  Administered 2020-08-25 (×3): 8 [IU] via SUBCUTANEOUS
  Administered 2020-08-26 (×2): 2 [IU] via SUBCUTANEOUS

## 2020-08-23 MED ORDER — SODIUM CHLORIDE 0.9% FLUSH
3.0000 mL | Freq: Two times a day (BID) | INTRAVENOUS | Status: DC
  Administered 2020-08-23 – 2020-08-26 (×6): 3 mL via INTRAVENOUS

## 2020-08-23 MED ORDER — FUROSEMIDE 40 MG PO TABS
40.0000 mg | ORAL_TABLET | Freq: Every day | ORAL | Status: DC
  Administered 2020-08-23: 40 mg via ORAL
  Filled 2020-08-23: qty 1

## 2020-08-23 MED ORDER — SODIUM CHLORIDE 0.9 % IV SOLN: 250.0000 mL | INTRAVENOUS | Status: DC | PRN

## 2020-08-23 MED ORDER — METFORMIN HCL 500 MG PO TABS
1000.0000 mg | ORAL_TABLET | Freq: Two times a day (BID) | ORAL | Status: DC
  Administered 2020-08-23 – 2020-08-26 (×7): 1000 mg via ORAL
  Filled 2020-08-23 (×7): qty 2

## 2020-08-23 MED ORDER — LORATADINE 10 MG PO TABS: 10.0000 mg | ORAL_TABLET | Freq: Every day | ORAL | Status: DC | PRN

## 2020-08-23 MED ORDER — ~~LOC~~ CARDIAC SURGERY, PATIENT & FAMILY EDUCATION: Freq: Once | Status: AC

## 2020-08-23 MED FILL — Magnesium Sulfate Inj 50%: INTRAMUSCULAR | Qty: 10 | Status: AC

## 2020-08-23 MED FILL — Heparin Sodium (Porcine) Inj 1000 Unit/ML: INTRAMUSCULAR | Qty: 10 | Status: AC

## 2020-08-23 MED FILL — Potassium Chloride Inj 2 mEq/ML: INTRAVENOUS | Qty: 40 | Status: AC

## 2020-08-23 MED FILL — Lidocaine HCl Local Soln Prefilled Syringe 100 MG/5ML (2%): INTRAMUSCULAR | Qty: 5 | Status: AC

## 2020-08-23 MED FILL — Electrolyte-R (PH 7.4) Solution: INTRAVENOUS | Qty: 4000 | Status: AC

## 2020-08-23 MED FILL — Calcium Chloride Inj 10%: INTRAVENOUS | Qty: 10 | Status: AC

## 2020-08-23 MED FILL — Albumin, Human Inj 5%: INTRAVENOUS | Qty: 250 | Status: AC

## 2020-08-23 MED FILL — Sodium Bicarbonate IV Soln 8.4%: INTRAVENOUS | Qty: 50 | Status: AC

## 2020-08-23 MED FILL — Mannitol IV Soln 20%: INTRAVENOUS | Qty: 500 | Status: AC

## 2020-08-23 MED FILL — Sodium Chloride IV Soln 0.9%: INTRAVENOUS | Qty: 2000 | Status: AC

## 2020-08-23 MED FILL — Potassium Chloride Inj 2 mEq/ML: INTRAVENOUS | Qty: 20 | Status: AC

## 2020-08-23 MED FILL — Heparin Sodium (Porcine) Inj 1000 Unit/ML: INTRAMUSCULAR | Qty: 30 | Status: AC

## 2020-08-23 NOTE — Discharge Instructions (Signed)

## 2020-08-23 NOTE — Progress Notes (Signed)
2 Days Post-Op Procedure(s) (LRB): CORONARY ARTERY BYPASS GRAFTING (CABG) X 4, USING LEFT INTERNAL MAMMARY ARTERY AND RIGHT GREATER SAPHENOUS VEIN HARVESTED ENDOSCOPICALLY. SVG TO OM1, OM3 SEQUENTIALLY, SVG TO DIAG., LIMA TO LAD (N/A) TRANSESOPHAGEAL ECHOCARDIOGRAM (TEE) (N/A) ENDOVEIN HARVEST OF GREATER SAPHENOUS VEIN (Right) Subjective: Hurts when coughs  Objective: Vital signs in last 24 hours: Temp:  [97.88 F (36.6 C)-100 F (37.8 C)] 99.4 F (37.4 C) (05/27 0700) Pulse Rate:  [76-107] 104 (05/27 0700) Cardiac Rhythm: Normal sinus rhythm (05/27 0400) Resp:  [10-23] 19 (05/27 0700) BP: (115-143)/(59-89) 126/83 (05/27 0700) SpO2:  [91 %-100 %] 95 % (05/27 0700) Arterial Line BP: (121-189)/(50-84) 154/52 (05/26 1600) Weight:  [110.3 kg] 110.3 kg (05/27 0500)  Hemodynamic parameters for last 24 hours: PAP: (33-45)/(17-27) 37/20 CO:  [4.1 L/min-4.2 L/min] 4.1 L/min CI:  [1.9 L/min/m2] 1.9 L/min/m2  Intake/Output from previous day: 05/26 0701 - 05/27 0700 In: 1046.8 [P.O.:240; I.V.:546.1; IV Piggyback:260.7] Out: 2270 [Urine:2160; Chest Tube:110] Intake/Output this shift: No intake/output data recorded.  General appearance: alert, cooperative and no distress Neurologic: intact Heart: mild tachy, regular Lungs: diminished breath sounds left base Abdomen: normal findings: soft, non-tender  Lab Results: Recent Labs    08/22/20 1632 08/23/20 0519  WBC 10.4 11.8*  HGB 10.5* 10.2*  HCT 32.8* 32.4*  PLT 218 205   BMET:  Recent Labs    08/22/20 1632 08/23/20 0519  NA 134* 133*  K 4.6 4.5  CL 101 97*  CO2 25 28  GLUCOSE 204* 246*  BUN 16 19  CREATININE 0.99 1.05  CALCIUM 8.2* 8.2*    PT/INR:  Recent Labs    08/21/20 1356  LABPROT 15.8*  INR 1.3*   ABG    Component Value Date/Time   PHART 7.364 08/21/2020 2200   HCO3 22.8 08/21/2020 2200   TCO2 24 08/21/2020 2200   ACIDBASEDEF 2.0 08/21/2020 2200   O2SAT 97.0 08/21/2020 2200   CBG (last 3)  Recent  Labs    08/22/20 1630 08/22/20 1947 08/23/20 0705  GLUCAP 193* 238* 283*    Assessment/Plan: S/P Procedure(s) (LRB): CORONARY ARTERY BYPASS GRAFTING (CABG) X 4, USING LEFT INTERNAL MAMMARY ARTERY AND RIGHT GREATER SAPHENOUS VEIN HARVESTED ENDOSCOPICALLY. SVG TO OM1, OM3 SEQUENTIALLY, SVG TO DIAG., LIMA TO LAD (N/A) TRANSESOPHAGEAL ECHOCARDIOGRAM (TEE) (N/A) ENDOVEIN HARVEST OF GREATER SAPHENOUS VEIN (Right) Plan for transfer to step-down: see transfer orders  Doing well POD # 2 CV- mild ST, increase coreg to baseline 18.75 mg BID  ASA, restart Brillinta after pacing wires out RESP_ IS for left lower lobe atelectasis RENAL- creatinine and lytes OK  PO Lasix, Dc Foley ENDO- CBG elevated- increase levemir, resume metformin Anemia sec to ABL- mild, follow Dc central line Transfer to 4E   LOS: 4 days    Melrose Nakayama 08/23/2020

## 2020-08-23 NOTE — Consult Note (Signed)
   [facility] Inpatient Consult   08/23/2020  [name] [name] 09-10-1955 [id number]   [facility] [employer] [ACO] Patient: [employer]  Primary Care Provider: Chevis Pretty, FNP in the [place], Embedded practice, this provider is listed to provide the transition of care follow up calls and appointments.  Patient screened for re-admission less than 7 days hospitalization with noted  to assess for potential [facility] service needs for post hospital transition.  Briefly, review of patient's medical record for disposition/transition of care needs. [facility] roster reveals patient has access to an embedded chronic care management team.  Plan:  Continue to follow progress and disposition to assess for post hospital care management needs.  To make a referral to the Embedded team for chronic care management follow up, if appropriate at transition for ongoing needs.  For questions contact:   Natividad Brood, RN BSN [facility] Liaison  [phone] business mobile phone Toll free office [phone]  Fax number: [phone] [email or web address]

## 2020-08-23 NOTE — Progress Notes (Signed)
Mobility Specialist - Progress Note   08/23/20 1740  Mobility  Activity Ambulated in hall  Level of Assistance Standby assist, set-up cues, supervision of patient - no hands on  Assistive Device Front wheel walker  Distance Ambulated (ft) 60 ft  Mobility Ambulated with assistance in hallway  Mobility Response Tolerated well  Mobility performed by Mobility specialist  $Mobility charge 1 Mobility   Pre-mobility: 87 HR, 97% SpO2 During mobility: 99 HR, 96% SpO2 Post-mobility: 91 HR, 115/48 BP, 99% SpO2  Distance limited due to fatigue. When standing, he had posterior LOB and required assistance to correct himself. He required one standing rest break due to feeling "whoozy", SpO2 was 96% on 2L O2 at that time. Pt to recliner after walk , call bell at side and wife in room.   Pricilla Handler Mobility Specialist Mobility Specialist Phone: [phone]

## 2020-08-23 NOTE — Progress Notes (Signed)
Pt admitted to [facility] from [facility].  Pt is A&OX4 and neuro intact.  Pt placed on telemetry and CCMD notified.  Vitals taken and all within normal range.  Pt is currently comfortable and not in pain.

## 2020-08-23 NOTE — Discharge Summary (Signed)
Physician Discharge Summary  Patient ID: [name] [name] MRN: [id number] DOB/AGE: July 04, 1955 65 y.o.  Admit date: 08/18/2020 Discharge date: 08/26/2020  Admission Diagnoses: Acute non-ST elevation myocardial infarction Multivessel coronary artery disease Hypertension Dyslipidemia Type 2 diabetes mellitus Obesity  Discharge Diagnoses:  Acute non-ST elevation myocardial infarction Multivessel coronary artery disease Hypertension Dyslipidemia Type 2 diabetes mellitus Obesity S/P CABG x 4 Expected acute blood loss anemia   Discharged Condition: stable  History of Present Illness:  [name] [name] is a 65 year old man with a past medical history significant for coronary disease, MI, multiple percutaneous interventions, hypertension, mixed dyslipidemia, insulin-dependent type 2 diabetes, reflux, and anxiety.  His history of coronary disease dates back over 15 years.  He has had multiple percutaneous interventions.  At one point he had a V. fib arrest in 2012.  His current issue started in the fall 2021.  He was having progressive angina.  He had catheterization which showed severe multivessel coronary disease.  He was seen in consultation by Dr. Servando Snare.  After discussion with cardiology the plan was to attempt repeat coronary intervention.  He had an orbital atherectomy and drug-eluting stent placement in the ostial LAD.  In February 2022 he had relook catheterization which showed in-stent restenosis and he had another intervention.  He felt a little better for a while but then was complaining of fatigue and developed recurrent anginal symptoms.  His chest pain has become more frequent and more severe so he was admitted for repeat cardiac catheterization which again shows in-stent restenosis in the LAD and severe three-vessel disease overall.  He does have diffusely diseased vessels with some targets being small and poor quality.  He remained pain-free.  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    Hospital Course:  After being given sufficient time for "washout" of his P2 Y 12 inhibitor, he was prepared and taken to the operative room on 08/21/2020.  Three-vessel coronary bypass grafting was carried out.  The target vessels were small and diffusely diseased.  Please see operative note for details.  Procedure, he separated from cardiopulmonary bypass without difficulty.  He was transferred to the surgical ICU.  He remained hemodynamically stable.  He was weaned from the mechanical ventilator and extubated at about 9 PM on the day of surgery.  On the following day, he was mobilized with the cardiac rehab team.  The chest tubes and monitoring lines were removed routinely.  Started back on aspirin, carvedilol, and statin.  His glucose was monitored and sliding scale insulin was provided as required.  He was also started on subcu Levemir postoperatively and his metformin was resumed on postop day 2.  He was transferred to 4 E., progressive care.  Mobility gradually improved.  He was weaned from the supplemental oxygen.  Diet and activity were advanced routinely and well tolerated. He was weaned off the Insulin drip. He was restarted on Metformin as taken prior to surgery and Insulin was adjusted as needed. His pre op HGA1C is 8.2. He will need close medical follow up after discharge. He was still requiring 2 liters of oxygen via [place] post op, but was later weaned to room air. His wounds are clean, dry, and healing without signs of infection. He has been tolerating a diet and has had a bowel movement. Epicardial pacing wires were removed. Chest tube sutures will be removed prior to discharge. Per Dr. Roxan Hockey, he is  felt surgically stable for discharge today.  Consults: None  Significant Diagnostic Studies:   EXAM: PORTABLE CHEST 1 VIEW  COMPARISON:  08/22/2020  FINDINGS: Pulmonary  artery catheter was removed. Right jugular central line is still present. Chest tubes have been removed. Increased densities at the left lung base. Negative for pneumothorax. Cardiac silhouette remains enlarged. Post CABG changes with median sternotomy wires.  IMPRESSION: 1. Removal of chest tubes without a pneumothorax. 2. Increased densities at the left lung base. This could represent a combination atelectasis and pleural fluid.   Electronically Signed   By: Markus Daft M.D.   On: 08/23/2020 08:15  Treatments:   Operative Report   DATE OF PROCEDURE: 08/21/2020  PREOPERATIVE DIAGNOSIS:  Three-vessel coronary artery disease.  POSTOPERATIVE DIAGNOSIS:  Three-vessel coronary artery disease.  PROCEDURES PERFORMED:  Median sternotomy, extracorporeal circulation, coronary artery bypass grafting x 4 (left internal mammary artery to LAD, saphenous vein graft to diagonal, sequential saphenous vein graft to obtuse marginal 1 and 3), endoscopic vein  harvest, right thigh.  SURGEON:  Revonda Standard. Roxan Hockey, MD   ASSISTANT: Enid Cutter, PA.  ANESTHESIA:  General.  FINDINGS:  Poor quality target vessels, diffusely diseased OM2, not graftable, good quality mammary, fair quality vein.  Transesophageal echocardiography showed overall preserved left ventricular systolic function.  There was mild aortic insufficiency.  CLINICAL NOTE:  The patient is a 65 year old man with a longstanding history of coronary artery disease.  He has had multiple percutaneous interventions.  He recently has developed unstable anginal symptoms and on catheterization has had in-stent  restenosis.  He does have a small diffusely diseased target vessels.  He was offered the option of coronary artery bypass grafting.  The indications, risks, benefits, alternatives, and limitations were discussed in detail with the patient.  He understood  and accepted the risks and agreed to proceed.  Discharge  Exam: Blood pressure 122/73, pulse 89, temperature 98.3 F (36.8 C), temperature source Oral, resp. rate 17, height _0  (1.753 m), weight 109.1 kg, SpO2 95 %. Cardiovascular: RRR Pulmonary: Diminished bibasilar breath sounds L>R Abdomen: Soft, non tender, bowel sounds present. Extremities: Mild bilateral lower extremity edema. Wounds: Sternal wound is clean and dry.  No erythema or signs of infection.RLE wound is clean and dry.  Disposition:  Discharge disposition: 01-Home or Self Care       Discharge Instructions    Amb Referral to Cardiac Rehabilitation   Complete by: As directed    Diagnosis:  CABG NSTEMI     CABG X ___: 4   After initial evaluation and assessments completed: Virtual Based Care may be provided alone or in conjunction with Phase 2 Cardiac Rehab based on patient barriers.: Yes     Allergies as of 08/26/2020   No Known Allergies     Medication List    STOP taking these medications   aspirin 81 MG tablet Replaced by: aspirin 325 MG EC tablet   ibuprofen 200 MG tablet Commonly known as: ADVIL   isosorbide mononitrate 60 MG 24 hr tablet Commonly known as: IMDUR   lisinopril-hydrochlorothiazide 20-12.5 MG tablet Commonly known as: Zestoretic   nitroGLYCERIN 0.4 MG SL tablet Commonly known as: NITROSTAT     TAKE these medications   Accu-Chek Aviva Plus test strip Generic drug: glucose blood Check BS up to 4 times a day dx E11.8   Accu-Chek Softclix Lancets lancets USE TO CHECK BLOOD SUGAR UP TO 4 TIMES DAILY.   aspirin 325 MG EC tablet Take 1 tablet (325  mg total) by mouth daily. Replaces: aspirin 81 MG tablet   atorvastatin 80 MG tablet Commonly known as: LIPITOR Take 1 tablet (80 mg total) by mouth daily.   blood glucose meter kit and supplies Dispense based on patient and insurance preference. Use up to four times daily as directed. (FOR ICD-10 E10.9, E11.9).   carvedilol 12.5 MG tablet Commonly known as: COREG Take 1.5 tablets  (18.75 mg total) by mouth 2 (two) times daily.   cetirizine 10 MG tablet Commonly known as: ZYRTEC Take 10 mg by mouth daily.   Cinnamon 500 MG Tabs Take 1,000 mg by mouth 2 (two) times daily.   CRANBERRY PO Take 4,200 mg by mouth 2 (two) times daily.   furosemide 40 MG tablet Commonly known as: LASIX Take 1 tablet (40 mg total) by mouth daily. Start taking on: Aug 27, 2020   Global Ease Inject Pen Needles 31G X 8 MM Misc Generic drug: Insulin Pen Needle Use up to 8 times a day Dx E11.8   Insulin Aspart FlexPen 100 UNIT/ML Sopn INJECT UP TO 45 UNITS EVERY DAY What changed:   how much to take  how to take this  when to take this  additional instructions   Lantus SoloStar 100 UNIT/ML Solostar Pen Generic drug: insulin glargine 44u in morning and 75 at night What changed:   how much to take  how to take this  when to take this  additional instructions   lisinopril 2.5 MG tablet Commonly known as: ZESTRIL Take 1 tablet (2.5 mg total) by mouth daily.   metFORMIN 500 MG tablet Commonly known as: GLUCOPHAGE TAKE 2 TABLETS BY MOUTHTWICE DAILY. What changed:   how much to take  how to take this  when to take this   Ozempic (0.25 or 0.5 MG/DOSE) 2 MG/1.5ML Sopn Generic drug: Semaglutide(0.25 or 0.5MG/DOS) INJECT 0.375 MLS (0.5 MG TOTAL) INTO THE SKIN ONCE A WEEK. What changed:   how much to take  how to take this  when to take this   pantoprazole 40 MG tablet Commonly known as: PROTONIX Take 1 tablet (40 mg total) by mouth daily as needed (for acid reflux).   tamsulosin 0.4 MG Caps capsule Commonly known as: FLOMAX Take 1 capsule (0.4 mg total) by mouth at bedtime.   traMADol 50 MG tablet Commonly known as: Ultram Take 1 tablet (50 mg total) by mouth every 6 (six) hours as needed for severe pain.       Follow-up Information    Melrose Nakayama, MD. Go on 09/24/2020.   Specialty: Cardiothoracic Surgery Why: PA/LAT CXR to be taken (at  [facility] which is in the same building as Dr. Leonarda Salon office) on at;Appointment time is at 10:00 am Contact information: [place] [address] [phone]        Chevis Pretty, Dor[place]. Go on 11/06/2020.   Specialty: Family Medicine Why: for a follow up appointment regarding further diabetes management and surveillance of HGA1C 8.2. Appointment time is at 11:30 am Contact information: [place] [address] [phone]        Satira Sark, MD. Go on 12/18/2020.   Specialty: Cardiology Why: Appointment time is at 8:20 am Contact information: [address] [place] [address] [phone]        Verta Ellen., NP Follow up on 09/12/2020.   Specialty: Cardiology Why: Please arrive 15 minutes early for your 3:30pm post-hospital cardiology appointment  Contact information: [place] [address] [phone]              The patient has been discharged on:   1.Beta Blocker:  Yes [ x  ]                              No   [   ]                              If No, reason:  2.Ace Inhibitor/ARB: Yes [ x  ]                                     No  [    ]                                     If No, reason:  3.Statin:   Yes [ x  ]                  No  [   ]                  If No, reason:  4.Ecasa:  Yes  [ x ]                  No   [   ]                  If No, reason:    Signed: Nani Skillern, PA-C 08/26/2020, 10:15 AM

## 2020-08-24 ENCOUNTER — Inpatient Hospital Stay (HOSPITAL_COMMUNITY): Payer: Medicare PPO

## 2020-08-24 LAB — GLUCOSE, CAPILLARY
Glucose-Capillary: 213 mg/dL — ABNORMAL HIGH (ref 70–99)
Glucose-Capillary: 268 mg/dL — ABNORMAL HIGH (ref 70–99)
Glucose-Capillary: 172 mg/dL — ABNORMAL HIGH (ref 70–99)
Glucose-Capillary: 124 mg/dL — ABNORMAL HIGH (ref 70–99)

## 2020-08-24 LAB — CBC
HCT: 34.5 % — ABNORMAL LOW (ref 39.0–52.0)
Hemoglobin: 10.8 g/dL — ABNORMAL LOW (ref 13.0–17.0)
MCH: 27.8 pg (ref 26.0–34.0)
MCHC: 31.3 g/dL (ref 30.0–36.0)
MCV: 88.7 fL (ref 80.0–100.0)
Platelets: 208 10*3/uL (ref 150–400)
RBC: 3.89 MIL/uL — ABNORMAL LOW (ref 4.22–5.81)
RDW: 15.3 % (ref 11.5–15.5)
WBC: 12.3 10*3/uL — ABNORMAL HIGH (ref 4.0–10.5)
nRBC: 0 % (ref 0.0–0.2)

## 2020-08-24 LAB — BASIC METABOLIC PANEL
Anion gap: 10 (ref 5–15)
BUN: 29 mg/dL — ABNORMAL HIGH (ref 8–23)
CO2: 27 mmol/L (ref 22–32)
Calcium: 8.6 mg/dL — ABNORMAL LOW (ref 8.9–10.3)
Chloride: 95 mmol/L — ABNORMAL LOW (ref 98–111)
Creatinine, Ser: 1.08 mg/dL (ref 0.61–1.24)
GFR, Estimated: >60 mL/min (ref >60)
Glucose, Bld: 243 mg/dL — ABNORMAL HIGH (ref 70–99)
Potassium: 4.8 mmol/L (ref 3.5–5.1)
Sodium: 132 mmol/L — ABNORMAL LOW (ref 135–145)

## 2020-08-24 MED ORDER — INSULIN DETEMIR 100 UNIT/ML ~~LOC~~ SOLN
50.0000 [IU] | Freq: Two times a day (BID) | SUBCUTANEOUS | Status: DC
  Administered 2020-08-24 (×2): 50 [IU] via SUBCUTANEOUS
  Filled 2020-08-24 (×4): qty 0.5

## 2020-08-24 MED ORDER — FUROSEMIDE 10 MG/ML IJ SOLN
40.0000 mg | Freq: Once | INTRAMUSCULAR | Status: AC
  Administered 2020-08-24: 40 mg via INTRAVENOUS
  Filled 2020-08-24: qty 4

## 2020-08-24 MED ORDER — FUROSEMIDE 40 MG PO TABS
40.0000 mg | ORAL_TABLET | Freq: Every day | ORAL | Status: DC
  Administered 2020-08-25 – 2020-08-26 (×2): 40 mg via ORAL
  Filled 2020-08-24 (×2): qty 1

## 2020-08-24 NOTE — Progress Notes (Signed)
Pacing wires removed per protocol.  Patient tolerated procedure well.  VS q15 min x 4, bedrest 1 hr.

## 2020-08-24 NOTE — Progress Notes (Signed)
Mobility Specialist - Progress Note   08/24/20 1638  Mobility  Activity Ambulated in hall  Level of Assistance Standby assist, set-up cues, supervision of patient - no hands on  Assistive Device Front wheel walker  Distance Ambulated (ft) 60 ft  Mobility Ambulated with assistance in hallway  Mobility Response Tolerated well  Mobility performed by Mobility specialist  $Mobility charge 1 Mobility   Distance limited by fatigue. VSS on 2L O2. Pt left sitting up on edge of bed after walk, wife in room.   Pricilla Handler Mobility Specialist Mobility Specialist Phone: [phone]

## 2020-08-24 NOTE — Progress Notes (Addendum)
      [address]       [place] [address]             [phone]        3 Days Post-Op Procedure(s) (LRB): CORONARY ARTERY BYPASS GRAFTING (CABG) X 4, USING LEFT INTERNAL MAMMARY ARTERY AND RIGHT GREATER SAPHENOUS VEIN HARVESTED ENDOSCOPICALLY. SVG TO OM1, OM3 SEQUENTIALLY, SVG TO DIAG., LIMA TO LAD (N/A) TRANSESOPHAGEAL ECHOCARDIOGRAM (TEE) (N/A) ENDOVEIN HARVEST OF GREATER SAPHENOUS VEIN (Right)  Subjective: Patient just finished breakfast. He feels "so so". He is passing flatus  Objective: Vital signs in last 24 hours: Temp:  [98 F (36.7 C)-98.9 F (37.2 C)] 98 F (36.7 C) (05/28 0425) Pulse Rate:  [82-95] 89 (05/28 0425) Cardiac Rhythm: Normal sinus rhythm (05/27 2005) Resp:  [12-20] 18 (05/28 0425) BP: (101-126)/(59-75) 117/68 (05/28 0425) SpO2:  [92 %-99 %] 99 % (05/28 0425) FiO2 (%):  [28 %] 28 % (05/27 1533) Weight:  [110.5 kg] 110.5 kg (05/28 0425)  Pre op weight 107.3 kg Current Weight  08/24/20 110.5 kg       Intake/Output from previous day: 05/27 0701 - 05/28 0700 In: 824.8 [P.O.:600; I.V.:124.8; IV Piggyback:100] Out: 350 [Urine:350]   Physical Exam:  Cardiovascular: RRR Pulmonary: Diminished bibasilar breath sounds L>R Abdomen: Soft, non tender, bowel sounds present. Extremities: Mild bilateral lower extremity edema. Wounds: Sternal dressing removed and wound is clean and dry.  No erythema or signs of infection.  Lab Results: CBC: Recent Labs    08/23/20 0519 08/24/20 0056  WBC 11.8* 12.3*  HGB 10.2* 10.8*  HCT 32.4* 34.5*  PLT 205 208   BMET:  Recent Labs    08/23/20 0519 08/24/20 0056  NA 133* 132*  K 4.5 4.8  CL 97* 95*  CO2 28 27  GLUCOSE 246* 243*  BUN 19 29*  CREATININE 1.05 1.08  CALCIUM 8.2* 8.6*    PT/INR:  Lab Results  Component Value Date   INR 1.3 (H) 08/21/2020   INR 1.0 08/20/2020   INR 0.96 09/30/2010   ABG:  INR: Will add last result for INR, ABG once components are  confirmed Will add last 4 CBG results once components are confirmed  Assessment/Plan:  1. CV - SR with HR in the 90's. On Coreg 18.75 mg bid 2.  Pulmonary - On 2 liters of oxygen via [place]. Wean as able. CXR this am appears to show left pleural effusion/atelectasis. Encourage incentive spirometer. 3. Volume Overload - On Lasix 40 mg daily but will give IV this am 4.  Expected post op acute blood loss anemia - H and H this am sable at 10.8 and 34.5 5. DM-CBGs 301/256/213. On Insulin. Pre op HGA1C 8.2.On Metformin 1000 mg bid and will increase Insulin now for better glucose control. He will need close medical follow up after discharge.  Donielle M ZimmermanPA-C 08/24/2020,8:06 AM Patient seen and examined, agree with above Adjust insulin for hyperglycemia Diurese Continue IS for left lower lobe atelectasis (improving)  Remo Lipps C. Roxan Hockey, MD [facility] [phone]

## 2020-08-24 NOTE — Progress Notes (Signed)
CARDIAC REHAB PHASE I   PRE:  Rate/Rhythm: 86 SR  BP:  Supine: 119/64  Sitting:   Standing:    SaO2: 97%RA  MODE:  Ambulation: 160 ft   POST:  Rate/Rhythm: 90 SR     SaO2: 90-100% RA  Monitored whole walk 1011-1105 Pt walked 160 ft on RA (monitored sats whole walk) maintained 90-100%. Pt used rolling walker. To recliner after walk. Left oxygen tubing within reach in case pt needs. Monitored sats during ed on RA and never low. Discussed sternal precautions and staying in the tube. Encouraged IS. Gave heart healthy and diabetic diets. Gave walking instructions for ex. Referral to [facility] CRP 2 made. Pt and wife voiced understanding of ed.   Graylon Good, RN BSN  08/24/2020 11:00 AM

## 2020-08-25 LAB — GLUCOSE, CAPILLARY
Glucose-Capillary: 86 mg/dL (ref 70–99)
Glucose-Capillary: 203 mg/dL — ABNORMAL HIGH (ref 70–99)
Glucose-Capillary: 221 mg/dL — ABNORMAL HIGH (ref 70–99)
Glucose-Capillary: 209 mg/dL — ABNORMAL HIGH (ref 70–99)

## 2020-08-25 MED ORDER — INSULIN DETEMIR 100 UNIT/ML ~~LOC~~ SOLN
47.0000 [IU] | Freq: Two times a day (BID) | SUBCUTANEOUS | Status: DC
  Administered 2020-08-25 (×2): 47 [IU] via SUBCUTANEOUS
  Filled 2020-08-25 (×4): qty 0.47

## 2020-08-25 MED ORDER — LACTULOSE 10 GM/15ML PO SOLN
20.0000 g | Freq: Once | ORAL | Status: AC
  Administered 2020-08-25: 20 g via ORAL
  Filled 2020-08-25: qty 30

## 2020-08-25 NOTE — Progress Notes (Addendum)
      [address]       [place] [address]             [phone]        4 Days Post-Op Procedure(s) (LRB): CORONARY ARTERY BYPASS GRAFTING (CABG) X 4, USING LEFT INTERNAL MAMMARY ARTERY AND RIGHT GREATER SAPHENOUS VEIN HARVESTED ENDOSCOPICALLY. SVG TO OM1, OM3 SEQUENTIALLY, SVG TO DIAG., LIMA TO LAD (N/A) TRANSESOPHAGEAL ECHOCARDIOGRAM (TEE) (N/A) ENDOVEIN HARVEST OF GREATER SAPHENOUS VEIN (Right)  Subjective: Patient sitting in chair. He has no specific complaint except feeling tired. He is passing flatus but no bowel movement yet.  Objective: Vital signs in last 24 hours: Temp:  [97.9 F (36.6 C)-99.2 F (37.3 C)] 99 F (37.2 C) (05/29 0554) Pulse Rate:  [88-91] 91 (05/29 0554) Cardiac Rhythm: Normal sinus rhythm (05/28 1900) Resp:  [12-20] 20 (05/29 0554) BP: (96-134)/(59-68) 113/66 (05/29 0554) SpO2:  [91 %-99 %] 95 % (05/29 0554) Weight:  [109.8 kg] 109.8 kg (05/29 0554)  Pre op weight 107.3 kg Current Weight  08/25/20 109.8 kg      Intake/Output from previous day: 05/28 0701 - 05/29 0700 In: 720 [P.O.:720] Out: 1350 [Urine:1350]   Physical Exam:  Cardiovascular: RRR Pulmonary: Diminished bibasilar breath sounds L>R Abdomen: Soft, non tender, bowel sounds present. Extremities: Mild bilateral lower extremity edema. Wounds: S wound is clean and dry.  No erythema or signs of infection.RLE wound is clean and dry.  Lab Results: CBC: Recent Labs    08/23/20 0519 08/24/20 0056  WBC 11.8* 12.3*  HGB 10.2* 10.8*  HCT 32.4* 34.5*  PLT 205 208   BMET:  Recent Labs    08/23/20 0519 08/24/20 0056  NA 133* 132*  K 4.5 4.8  CL 97* 95*  CO2 28 27  GLUCOSE 246* 243*  BUN 19 29*  CREATININE 1.05 1.08  CALCIUM 8.2* 8.6*    PT/INR:  Lab Results  Component Value Date   INR 1.3 (H) 08/21/2020   INR 1.0 08/20/2020   INR 0.96 09/30/2010   ABG:  INR: Will add last result for INR, ABG once components are confirmed Will add last 4  CBG results once components are confirmed  Assessment/Plan:  1. CV - SR, first degree heart block with HR in the 90's. On Coreg 18.75 mg bid 2.  Pulmonary - On 2 liters of oxygen via [place]. Wean as able. CXR this am appears to show left pleural effusion/atelectasis. Encourage incentive spirometer and flutter valve 3. Volume Overload - On Lasix 40 mg daily but will give IV this am 4.  Expected post op acute blood loss anemia - H and H this am sable at 10.8 and 34.5 5. DM-CBGs 172/124/86. On Insulin. Pre op HGA1C 8.2.On Metformin 1000 mg bid. He will need close medical follow up after discharge 6. LOC constipation. 7. Hope to discharge in am  Sharalyn Ink [facility] 08/25/2020,7:26 AM  Patient seen and examined, agree with above  Remo Lipps C. Roxan Hockey, MD [facility] [phone]

## 2020-08-25 NOTE — Progress Notes (Signed)
Mobility Specialist - Progress Note   08/25/20 1546  Mobility  Activity Ambulated in hall  Level of Assistance Standby assist, set-up cues, supervision of patient - no hands on  Assistive Device Front wheel walker  Distance Ambulated (ft) 200 ft  Mobility Ambulated with assistance in hallway  Mobility Response Tolerated well  Mobility performed by Mobility specialist  $Mobility charge 1 Mobility   Distance limited by fatigue. VSS throughout. Pt to recliner after walk, call bell at side.   Pricilla Handler Mobility Specialist Mobility Specialist Phone: [phone]

## 2020-08-26 ENCOUNTER — Encounter (HOSPITAL_COMMUNITY): Payer: Medicare PPO

## 2020-08-26 LAB — GLUCOSE, CAPILLARY: Glucose-Capillary: 136 mg/dL — ABNORMAL HIGH (ref 70–99)

## 2020-08-26 MED ORDER — FUROSEMIDE 40 MG PO TABS: 40.0000 mg | ORAL_TABLET | Freq: Every day | ORAL | 0 refills | Status: DC

## 2020-08-26 MED ORDER — LISINOPRIL 2.5 MG PO TABS
2.5000 mg | ORAL_TABLET | Freq: Every day | ORAL | Status: DC
  Filled 2020-08-26 (×2): qty 1

## 2020-08-26 MED ORDER — TRAMADOL HCL 50 MG PO TABS: 50.0000 mg | ORAL_TABLET | Freq: Four times a day (QID) | ORAL | 0 refills | Status: DC | PRN

## 2020-08-26 MED ORDER — ASPIRIN 325 MG PO TBEC: 325.0000 mg | DELAYED_RELEASE_TABLET | Freq: Every day | ORAL | 0 refills | Status: DC

## 2020-08-26 MED ORDER — LISINOPRIL 2.5 MG PO TABS: 2.5000 mg | ORAL_TABLET | Freq: Every day | ORAL | 1 refills | Status: DC

## 2020-08-26 NOTE — Care Management Important Message (Signed)
Important Message  Patient Details  Name: [name] [name] MRN: [id number] Date of Birth: Jun 30, 1955   Medicare Important Message Given:  Yes     Shelda Altes 08/26/2020, 9:34 AM

## 2020-08-26 NOTE — Progress Notes (Signed)
Patient discharged home. Peripheral IV's removed. AVS given, pt education provided with all questions addressed.  Patient transported by wheelchair to [facility].

## 2020-08-26 NOTE — Progress Notes (Addendum)
      [address]       [place] [address]             [phone]        5 Days Post-Op Procedure(s) (LRB): CORONARY ARTERY BYPASS GRAFTING (CABG) X 4, USING LEFT INTERNAL MAMMARY ARTERY AND RIGHT GREATER SAPHENOUS VEIN HARVESTED ENDOSCOPICALLY. SVG TO OM1, OM3 SEQUENTIALLY, SVG TO DIAG., LIMA TO LAD (N/A) TRANSESOPHAGEAL ECHOCARDIOGRAM (TEE) (N/A) ENDOVEIN HARVEST OF GREATER SAPHENOUS VEIN (Right)  Subjective: Patient had bowel movement. He has no specific complaint this am and wants to go home.  Objective: Vital signs in last 24 hours: Temp:  [98.1 F (36.7 C)-99.7 F (37.6 C)] 98.9 F (37.2 C) (05/30 0500) Pulse Rate:  [84-94] 94 (05/30 0500) Cardiac Rhythm: Normal sinus rhythm (05/29 1900) Resp:  [18-20] 19 (05/30 0500) BP: (111-142)/(62-83) 142/83 (05/30 0500) SpO2:  [92 %-97 %] 97 % (05/30 0500) Weight:  [109.1 kg] 109.1 kg (05/30 0500)  Pre op weight 107.3 kg Current Weight  08/26/20 109.1 kg      Intake/Output from previous day: 05/29 0701 - 05/30 0700 In: 480 [P.O.:480] Out: 200 [Urine:200]   Physical Exam:  Cardiovascular: RRR Pulmonary: Diminished bibasilar breath sounds L>R Abdomen: Soft, non tender, bowel sounds present. Extremities: Mild bilateral lower extremity edema. Wounds: Sternal wound is clean and dry.  No erythema or signs of infection.RLE wound is clean and dry.  Lab Results: CBC: Recent Labs    08/24/20 0056  WBC 12.3*  HGB 10.8*  HCT 34.5*  PLT 208   BMET:  Recent Labs    08/24/20 0056  NA 132*  K 4.8  CL 95*  CO2 27  GLUCOSE 243*  BUN 29*  CREATININE 1.08  CALCIUM 8.6*    PT/INR:  Lab Results  Component Value Date   INR 1.3 (H) 08/21/2020   INR 1.0 08/20/2020   INR 0.96 09/30/2010   ABG:  INR: Will add last result for INR, ABG once components are confirmed Will add last 4 CBG results once components are confirmed  Assessment/Plan:  1. CV - SR, first degree heart block with HR in the  80's. On Coreg 18.75 mg bid. Will restart low dose Lisinopril for better BP control. 2.  Pulmonary - On room air. Encourage incentive spirometer and flutter valve 3. Volume Overload - On Lasix 40 mg daily 4.  Expected post op acute blood loss anemia - Last H and H stable at 10.8 and 34.5 5. DM-CBGs 221/209/136. On Insulin and Metformin 1000 mg bid. . Pre op HGA1C 8.2. He will need close medical follow up after discharge 6. Discharge  Donielle M ZimmermanPA-C 08/26/2020,7:41 AM Patient seen and examined, agree with above Dc home today  Revonda Standard. Roxan Hockey, MD [facility] [phone]

## 2020-08-27 LAB — POCT I-STAT 7, (LYTES, BLD GAS, ICA,H+H)
Acid-Base Excess: 1 mmol/L (ref 0.0–2.0)
Bicarbonate: 25.6 mmol/L (ref 20.0–28.0)
Calcium, Ion: 1.03 mmol/L — ABNORMAL LOW (ref 1.15–1.40)
HCT: 27 % — ABNORMAL LOW (ref 39.0–52.0)
Hemoglobin: 9.2 g/dL — ABNORMAL LOW (ref 13.0–17.0)
O2 Saturation: 100 %
Potassium: 7.3 mmol/L (ref 3.5–5.1)
Sodium: 133 mmol/L — ABNORMAL LOW (ref 135–145)
TCO2: 27 mmol/L (ref 22–32)
pCO2 arterial: 41.8 mmHg (ref 32.0–48.0)
pH, Arterial: 7.395 (ref 7.350–7.450)
pO2, Arterial: 412 mmHg — ABNORMAL HIGH (ref 83.0–108.0)

## 2020-08-28 ENCOUNTER — Telehealth: Payer: Self-pay | Admitting: Cardiology

## 2020-08-28 ENCOUNTER — Telehealth: Payer: Self-pay

## 2020-08-28 ENCOUNTER — Encounter (HOSPITAL_COMMUNITY): Payer: Medicare PPO

## 2020-08-28 NOTE — Telephone Encounter (Signed)
Please give pt's wife Lelon Frohlich a call concerning the pt's furosemide (LASIX) 40 MG tablet [[id number]]   [phone]

## 2020-08-28 NOTE — Telephone Encounter (Signed)
Wife asking if patient should stop lasix after taking 5 pills that were given and if he is to stay off imdur since it was stopped at discharge. Advised that lasix is completed after 5 pills finished and that he is to stay off imdur for now and it can be addressed at follow up visit whether or not he will restart it. Verbalized understanding of plan.

## 2020-08-28 NOTE — Telephone Encounter (Signed)
Transition Care Management Follow-up Telephone Call  Date of discharge and from where: [facility] 08/[facility]  Diagnosis: NSTEMI, acute ischemis heart disease  How have you been since you were released from the hospital? Doing well, pretty tired, napping a lot  Any questions or concerns? No   He doesn't want to schedule hospital f/u with PCP - says he is following up with Cardiologist and will keep regular f/u here in August  Items Reviewed:  Did the pt receive and understand the discharge instructions provided? Yes   Medications obtained and verified? Yes   Other? No   Any new allergies since your discharge? No   Dietary orders reviewed? Yes  Do you have support at home? Yes   Home Care and Equipment/Supplies: Were home health services ordered? no Were any new equipment or medical supplies ordered?  No  Functional Questionnaire: (I = Independent and D = Dependent) ADLs: I  Bathing/Dressing- I  Meal Prep- I  Eating- I  Maintaining continence- I  Transferring/Ambulation- I  Managing Meds- I  Follow up appointments reviewed:   PCP Hospital f/u appt confirmed? No  Scheduled to see Shelah Lewandowsky on 11/06/20  - doesn't want to be seen before then.  [facility] f/u appt confirmed? Yes  Scheduled to see cardiologist on 6/16 @ 3:30.  Are transportation arrangements needed? No   If their condition worsens, is the pt aware to call PCP or go to the Emergency Dept.? Yes  Was the patient provided with contact information for the PCP's office or ED? Yes  Was to pt encouraged to call back with questions or concerns? Yes

## 2020-08-30 ENCOUNTER — Encounter (HOSPITAL_COMMUNITY): Payer: Medicare PPO

## 2020-09-02 ENCOUNTER — Encounter (HOSPITAL_COMMUNITY): Payer: Medicare PPO

## 2020-09-04 ENCOUNTER — Encounter (HOSPITAL_COMMUNITY): Payer: Medicare PPO

## 2020-09-06 ENCOUNTER — Encounter (HOSPITAL_COMMUNITY): Payer: Medicare PPO

## 2020-09-09 ENCOUNTER — Encounter (HOSPITAL_COMMUNITY): Payer: Medicare PPO

## 2020-09-11 NOTE — Progress Notes (Signed)
Cardiology Office Note  Date: 09/12/2020   ID: [name], [name] Apr 04, 1955, MRN [id number]  PCP:  Chevis Pretty, FNP  Cardiologist:  Rozann Lesches, MD Electrophysiologist:  None   Chief Complaint: Hospital follow up post Acute NSTEMI/CABG  History of Present Illness: [name] [name] is a 65 y.o. male with a history of CAD, MR, multiple PCI's, HTN, HLD, IDDM, GERD, anxiety. Status post CABG x 4 (LIMA-LAD, SVG-Diagonal, Sequential SVG to OM1 and OM3)  Previous catheterization on 05/22/2020: Proximal RCA lesion 70%, distal LAD 60%, first marginal 85%, first LPL-2 lesion 80%, previously placed first LPL 1 DES widely patent, LPA V-2 lesion 100% stenosed.  Distal LM to proximal LAD lesion 90% stenosed with in-stent restenosis.  Treated with 3.25 mm Wolverine then 4.0 [place] balloon.  Dissection noted by OCT past the edge of old stent.  DES was cephalic successfully placed using a Synergy XD 3.50 x 12, postdilated to 4.0 mm.  Post intervention there was 0% residual stenosis.  Stent was optimized with OCT.  Mid LAD lesion 120% stenosis.  mL ID lesion to 75% stenosis.  In-stent restenosis.  Scoring balloon angioplasty performed using balloon overlying 3.25 x 10.  Post intervention 0% residual stenosis.  Cardiac catheterization on 08/15/2020: Proximal RCA lesion 70%, RPA V lesion 100%, ostial LAD to proximal LAD 95% stenosed, mid LAD lesion 80% stenosed, second marginal lesion 90% stenosed, previously placed first LPA stent widely patent.  Dr. Gwenlyn Found mentioned he would need coronary artery bypass graft for complete revascularization given her aggressive early in-stent restenosis.  It was mentioned he would need Effient washout prior to revascularization.  He underwent bypass by Dr. Modesto Charon cardiothoracic surgery on 08/21/2020 with LIMA-LAD, SVG-diagonal, sequential SVG to OM1 and OM 3 with endoscopic vein harvest right thigh.  He states he is doing well today and just feels tired.  He has  some resting tachycardia at 105 on arrival today.  He denies any issues other than problems sleeping due to median sternotomy and having problems getting comfortable trying to sleep flat on his back.  He states he is sleeping sitting up in a recliner for now.  States his median sternotomy site and chest tube insertion sites look good.  Has some mild clear drainage from right endoscopic vein harvest area.  Does have some swelling in that area as well.  He states he was discharged on a few days worth of Lasix.  He denies any anginal or exertional symptoms, orthostatic symptoms, CVA or TIA-like symptoms, PND, orthopnea, bleeding.  Denies any claudication-like symptoms, DVT or PE-like symptoms.  Has some edema and has right upper leg and right lower leg with some ecchymosis and right upper inner thigh.  States his sugars are running a little better.  States he has not had to use his short acting insulin recently.  Past Medical History:  Diagnosis Date   Anxiety    Coronary atherosclerosis of native coronary artery    DES distal circumflex 2005; DES LAD/diagonal bifurcation 09/2010; DES ostial LAD and DES left PL 01/2020   Essential hypertension    GERD (gastroesophageal reflux disease)    History of kidney stones    Mixed hyperlipidemia    Myocardial infarction ([place])    Anterolateral with VF arrest 7/12   Type 2 diabetes mellitus ([place])     Past Surgical History:  Procedure Laterality Date   CARDIAC CATHETERIZATION  2012   CAROTID STENT     stents x 2  CHOLECYSTECTOMY N/A 12/04/2015   Procedure: LAPAROSCOPIC CHOLECYSTECTOMY;  Surgeon: Aviva Signs, MD;  Location: [facility];  Service: General;  Laterality: N/A;   CHOLECYSTECTOMY, LAPAROSCOPIC  12/05/2015   CORONARY ANGIOPLASTY  2012   STENT X 1 2012, STENT X 1 YRS BEFORE   CORONARY ARTERY BYPASS GRAFT N/A 08/21/2020   Procedure: CORONARY ARTERY BYPASS GRAFTING (CABG) X 4, USING LEFT INTERNAL MAMMARY ARTERY AND RIGHT GREATER SAPHENOUS VEIN HARVESTED  ENDOSCOPICALLY. SVG TO OM1, OM3 SEQUENTIALLY, SVG TO DIAG., LIMA TO LAD;  Surgeon: Melrose Nakayama, MD;  Location: [facility];  Service: Open Heart Surgery;  Laterality: N/A;   CORONARY ATHERECTOMY N/A 02/09/2020   Procedure: CORONARY ATHERECTOMY;  Surgeon: Wellington Hampshire, MD;  Location: [facility];  Service: Cardiovascular;  Laterality: N/A;   CORONARY STENT INTERVENTION N/A 02/09/2020   Procedure: CORONARY STENT INTERVENTION;  Surgeon: Wellington Hampshire, MD;  Location: [facility];  Service: Cardiovascular;  Laterality: N/A;   CORONARY STENT INTERVENTION N/A 05/22/2020   Procedure: CORONARY STENT INTERVENTION;  Surgeon: Jettie Booze, MD;  Location: [facility];  Service: Cardiovascular;  Laterality: N/A;   ENDOVEIN HARVEST OF GREATER SAPHENOUS VEIN Right 08/21/2020   Procedure: ENDOVEIN HARVEST OF GREATER SAPHENOUS VEIN;  Surgeon: Melrose Nakayama, MD;  Location: [facility];  Service: Open Heart Surgery;  Laterality: Right;   HYDROCELE EXCISION Bilateral 06/02/2017   Procedure: HYDROCELECTOMY ADULT;  Surgeon: Ceasar Mons, MD;  Location: [facility];  Service: Urology;  Laterality: Bilateral;  ONLY NEEDS 45 MIN   INGUINAL HERNIA REPAIR     RIGHT GROIN   INTRAVASCULAR IMAGING/OCT N/A 05/22/2020   Procedure: INTRAVASCULAR IMAGING/OCT;  Surgeon: Jettie Booze, MD;  Location: [facility];  Service: Cardiovascular;  Laterality: N/A;   INTRAVASCULAR ULTRASOUND/IVUS N/A 02/09/2020   Procedure: Intravascular Ultrasound/IVUS;  Surgeon: Wellington Hampshire, MD;  Location: [facility];  Service: Cardiovascular;  Laterality: N/A;   KNEE ARTHROSCOPY Left 05/23/2019   Procedure: LEFT KNEE ARTHROSCOPY AND DEBRIDEMENT PARTIAL MEDIAL MENISECTOMY;  Surgeon: Newt Minion, MD;  Location: [facility];  Service: Orthopedics;  Laterality: Left;   LEFT HEART CATH AND CORONARY ANGIOGRAPHY N/A 02/07/2020   Procedure: LEFT HEART CATH AND CORONARY  ANGIOGRAPHY;  Surgeon: Leonie Man, MD;  Location: [facility];  Service: Cardiovascular;  Laterality: N/A;   LEFT HEART CATH AND CORONARY ANGIOGRAPHY N/A 05/22/2020   Procedure: LEFT HEART CATH AND CORONARY ANGIOGRAPHY;  Surgeon: Jettie Booze, MD;  Location: [facility];  Service: Cardiovascular;  Laterality: N/A;   LEFT HEART CATH AND CORONARY ANGIOGRAPHY N/A 08/15/2020   Procedure: LEFT HEART CATH AND CORONARY ANGIOGRAPHY;  Surgeon: Lorretta Harp, MD;  Location: [facility];  Service: Cardiovascular;  Laterality: N/A;   TEE WITHOUT CARDIOVERSION N/A 08/21/2020   Procedure: TRANSESOPHAGEAL ECHOCARDIOGRAM (TEE);  Surgeon: Melrose Nakayama, MD;  Location: [facility];  Service: Open Heart Surgery;  Laterality: N/A;   TRIGGER FINGER RELEASE Right 01/08/2015   Procedure: RELEASE TRIGGER FINGER/A-1 PULLEY RIGHT MIDDLE FINGER;  Surgeon: Daryll Brod, MD;  Location: [facility];  Service: Orthopedics;  Laterality: Right;    Current Outpatient Medications  Medication Sig Dispense Refill   Accu-Chek Softclix Lancets lancets USE TO CHECK BLOOD SUGAR UP TO 4 TIMES DAILY. 100 each 0   aspirin EC 325 MG EC tablet Take 1 tablet (325 mg total) by mouth daily. 30 tablet 0   atorvastatin (  LIPITOR) 80 MG tablet Take 1 tablet (80 mg total) by mouth daily. 90 tablet 3   blood glucose meter kit and supplies Dispense based on patient and insurance preference. Use up to four times daily as directed. (FOR ICD-10 E10.9, E11.9). 1 each 0   cetirizine (ZYRTEC) 10 MG tablet Take 10 mg by mouth daily.     Cinnamon 500 MG TABS Take 1,000 mg by mouth 2 (two) times daily.      CRANBERRY PO Take 4,200 mg by mouth 2 (two) times daily.      GLOBAL EASE INJECT PEN NEEDLES 31G X 8 MM MISC Use up to 8 times a day Dx E11.8 800 each 3   glucose blood (ACCU-CHEK AVIVA PLUS) test strip Check BS up to 4 times a day dx E11.8 100 strip 11   Insulin Aspart FlexPen 100 UNIT/ML SOPN INJECT UP  TO 45 UNITS EVERY DAY (Patient taking differently: Inject 45-75 Units into the skin 2 (two) times daily with a meal. INJECT 45-75 units subq with breakfast and supper. Sliding scale) 15 mL 11   insulin glargine (LANTUS SOLOSTAR) 100 UNIT/ML Solostar Pen 44u in morning and 75 at night (Patient taking differently: Inject 45-75 Units into the skin See admin instructions. 45 units in morning and 75 at night) 30 mL 3   lisinopril (ZESTRIL) 2.5 MG tablet Take 1 tablet (2.5 mg total) by mouth daily. 30 tablet 1   metFORMIN (GLUCOPHAGE) 500 MG tablet TAKE 2 TABLETS BY MOUTHTWICE DAILY. (Patient taking differently: Take 1,000 mg by mouth 2 (two) times daily with a meal. TAKE 2 TABLETS BY MOUTHTWICE DAILY.) 120 tablet 1   pantoprazole (PROTONIX) 40 MG tablet Take 1 tablet (40 mg total) by mouth daily as needed (for acid reflux). 90 tablet 3   Semaglutide,0.25 or 0.5MG/DOS, (OZEMPIC, 0.25 OR 0.5 MG/DOSE,) 2 MG/1.5ML SOPN INJECT 0.375 MLS (0.5 MG TOTAL) INTO THE SKIN ONCE A WEEK. (Patient taking differently: Inject 0.5 mg into the skin every Thursday. INJECT 0.375 MLS (0.5 MG TOTAL) INTO THE SKIN ONCE A WEEK.) 1.5 mL 1   tamsulosin (FLOMAX) 0.4 MG CAPS capsule Take 1 capsule (0.4 mg total) by mouth at bedtime. 90 capsule 1   traMADol (ULTRAM) 50 MG tablet Take 1 tablet (50 mg total) by mouth every 6 (six) hours as needed for severe pain. 30 tablet 0   carvedilol (COREG) 25 MG tablet Take 1 tablet (25 mg total) by mouth 2 (two) times daily. 270 tablet 2   furosemide (LASIX) 20 MG tablet Take 1 tablet (20 mg total) by mouth daily for 4 days. 4 tablet 0   No current facility-administered medications for this visit.   Allergies:  Patient has no known allergies.   Social History: The patient  reports that he has never smoked. He has never used smokeless tobacco. He reports that he does not drink alcohol and does not use drugs.   Family History: The patient's family history includes Dementia in his father; Diabetes  in his father and maternal grandfather; Hyperlipidemia in his father and mother; Hypertension in his father and mother.   ROS:  Please see the history of present illness. Otherwise, complete review of systems is positive for none.  All other systems are reviewed and negative.   Physical Exam: VS:  BP 132/70   Pulse (!) 105   Ht _0  (1.753 m)   Wt 236 lb 12.8 oz (107.4 kg)   BMI 34.97 kg/m , BMI Body mass index is  34.97 kg/m.  Wt Readings from Last 3 Encounters:  09/12/20 236 lb 12.8 oz (107.4 kg)  08/26/20 240 lb 8 oz (109.1 kg)  08/15/20 242 lb 6.4 oz (110 kg)    General: Patient appears comfortable at rest. Neck: Supple, no elevated JVP or carotid bruits, no thyromegaly. Lungs: Clear to auscultation, nonlabored breathing at rest. Cardiac: Regular rate and rhythm, no S3 or significant systolic murmur, no pericardial rub. Extremities: No pitting edema, distal pulses 2+. Skin: Warm and dry.  Median sternotomy scar and chest tube sites clean and dry without drainage.  Right endoscopic vein harvest wound well approximated with some minor clear drainage from site.  Some swelling above the site with some ecchymosis on upper inner right thigh.  Some swelling below the knee. Musculoskeletal: No kyphosis. Neuropsychiatric: Alert and oriented x3, affect grossly appropriate.  ECG:  EKG Aug 22, 2020 normal sinus rhythm with a rate of 75, septal infarct, age undetermined, T wave abnormality consider lateral ischemia,  Recent Labwork: 08/05/2020: ALT 33; AST 23 08/22/2020: Magnesium 2.0 08/24/2020: BUN 29; Creatinine, Ser 1.08; Hemoglobin 10.8; Platelets 208; Potassium 4.8; Sodium 132     Component Value Date/Time   CHOL 77 08/16/2020 0319   CHOL 73 (L) 08/05/2020 1147   TRIG 128 08/16/2020 0319   HDL 21 (L) 08/16/2020 0319   HDL 19 (L) 08/05/2020 1147   CHOLHDL 3.7 08/16/2020 0319   VLDL 26 08/16/2020 0319   LDLCALC 30 08/16/2020 0319   LDLCALC 30 08/05/2020 1147    Other Studies  Reviewed Today: Cardiac catheterization 08/15/2020 Conclusion    Prox RCA lesion is 70% stenosed. LPAV lesion is 100% stenosed. Ost LAD to Prox LAD lesion is 95% stenosed. Mid LAD lesion is 80% stenosed. 2nd Mrg lesion is 90% stenosed. Previously placed 1st LPL stent (unknown type) is widely patent.   [name] [name] is a 65 y.o. male  Diagnostic Dominance: Left   IMPRESSION: Mr. Rudd has aggressive in-stent restenosis within the ostial/proximal LAD stent and mid LAD stent with obtuse marginal branch disease and an occluded PDA.  His last EF by hand-injection was normal.  He will need coronary artery bypass graft for complete revascularization giving aggressive early in-stent restenosis.  He is on Effient which was discontinued and he will need Effient washout prior to revascularization.  Given the accelerated nature of his symptoms and the degree of severity of his stenosis I favor hospitalization prior to revascularization.  The sheath was removed and a TR band was placed on the right wrist to achieve patent hemostasis.  The patient left lab in stable condition.    Echocardiogram 08/16/2020  Left ventricular ejection fraction, by estimation, is 60 to 65%. The left ventricle has normal function. Left ventricular endocardial border not optimally defined to evaluate regional wall motion. Grossly, no wall motion abnormalities seen. There is mild concentric left ventricular hypertrophy. Left ventricular diastolic parameters are consistent with Grade I diastolic dysfunction (impaired relaxation). Elevated left atrial pressure. 2. Right ventricular systolic function is normal. The right ventricular size is normal. Tricuspid regurgitation signal is inadequate for assessing PA pressure. 3. The mitral valve is normal in structure. No evidence of mitral valve regurgitation. 4. The aortic valve is tricuspid. There is mild calcification of the aortic valve. Aortic valve regurgitation is not  visualized. Mild aortic valve sclerosis is present, with no evidence of aortic valve stenosis. Comparison(s): No significant change from prior study. Prior echo in 01/2020 demonstrated mild ascending aorta dilation (41m) which was not  well visualized on current study. Will need continued monitoring as outpatient    Carotid and upper extremity vascular studies 08/16/2020  Right Carotid: Velocities in the right ICA are consistent with a 1-39% stenosis. Right Upper Extremity: Doppler waveforms remain within normal limits with right radial compression. Doppler waveforms remain within normal limits with right ulnar compression. Left Upper Extremity: Doppler waveforms remain within normal limits with left radial compression. Doppler waveforms decrease 50% with left ulnar compression. Electronically signed by Deitra Mayo MD on 08/16/2020 at 8:17:51 PM. Left Carotid: Velocities in the left ICA are consistent with a 1-39% stenosis. Vertebrals: Bilateral vertebral arteries demonstrate antegrade flow.   Cardiac catheterization 05/22/2020 Conclusion    Prox RCA lesion is 70% stenosed. Dist LAD lesion is 60% stenosed. 1st Mrg lesion is 85% stenosed. 1st LPL-2 lesion is 80% stenosed. Previously placed 1st LPL-1 drug eluting stent is widely patent. LPAV-2 lesion is 100% stenosed. Dist LM to Prox LAD lesion is 90% stenosed, instent restenosis. Treated with 3.25 mm Wolverine and then 4.0 [place] balloon. Dissection noted by OCT past the edge of old stent. A drug-eluting stent was successfully placed using a SYNERGY XD 3.50X12., post dilated to 4.0 mm. Post intervention, there is a 0% residual stenosis. Stent optimized with OCT. Mid LAD-1 lesion is 20% stenosed. Mid LAD-2 lesion is 75% stenosed. In-stent restenosis. Scoring balloon angioplasty was performed using a BALLOON WOLVERINE 3.25X10. Post intervention, there is a 0% residual stenosis. The left ventricular systolic function is normal. LV end  diastolic pressure is normal. The left ventricular ejection fraction is 55-65% by visual estimate. There is no aortic valve stenosis. A drug-eluting stent was successfully placed using a SYNERGY XD 3.50X12.   Continue dual antiplatelet therapy along with aggressive secondary prevention.   Diffuse, distal and small vessel disease in the LAD and OM territories.  Aggressive medical therapy.   Diagnostic Dominance: Left    Intervention           Cardiac catheterization 05/22/2020 INTRAVASCULAR IMAGING/OCT  CORONARY STENT INTERVENTION  LEFT HEART CATH AND CORONARY ANGIOGRAPHY      Conclusion     Prox RCA lesion is 70% stenosed. Dist LAD lesion is 60% stenosed. 1st Mrg lesion is 85% stenosed. 1st LPL-2 lesion is 80% stenosed. Previously placed 1st LPL-1 drug eluting stent is widely patent. LPAV-2 lesion is 100% stenosed. Dist LM to Prox LAD lesion is 90% stenosed, instent restenosis. Treated with 3.25 mm Wolverine and then 4.0 [place] balloon. Dissection noted by OCT past the edge of old stent. A drug-eluting stent was successfully placed using a SYNERGY XD 3.50X12., post dilated to 4.0 mm. Post intervention, there is a 0% residual stenosis. Stent optimized with OCT. Mid LAD-1 lesion is 20% stenosed. Mid LAD-2 lesion is 75% stenosed. In-stent restenosis. Scoring balloon angioplasty was performed using a BALLOON WOLVERINE 3.25X10. Post intervention, there is a 0% residual stenosis. The left ventricular systolic function is normal. LV end diastolic pressure is normal. The left ventricular ejection fraction is 55-65% by visual estimate. There is no aortic valve stenosis. A drug-eluting stent was successfully placed using a SYNERGY XD 3.50X12.   Continue dual antiplatelet therapy along with aggressive secondary prevention.   Diffuse, distal and small vessel disease in the LAD and OM territories.  Aggressive medical therapy.   Diagnostic Dominance: Left      Intervention             Echocardiogram 02/08/2020:  1. Left ventricular ejection fraction, by estimation, is 60 to 65%.  The  left ventricle has normal function. The left ventricle has no regional  wall motion abnormalities. Left ventricular diastolic parameters are  consistent with Grade I diastolic  dysfunction (impaired relaxation).   2. Right ventricular systolic function is normal. The right ventricular  size is normal. Tricuspid regurgitation signal is inadequate for assessing  PA pressure.   3. The mitral valve is normal in structure. No evidence of mitral valve  regurgitation. No evidence of mitral stenosis.   4. The aortic valve is tricuspid. Aortic valve regurgitation is not  visualized. Mild aortic valve sclerosis is present, with no evidence of  aortic valve stenosis.   5. Aortic dilatation noted. There is mild dilatation of the ascending  aorta, measuring 40 mm.   PCI 02/09/2020: Prox RCA lesion is 70% stenosed. Dist LM to Prox LAD lesion is 95% stenosed. Dist LAD lesion is 60% stenosed. Mid LAD lesion is 20% stenosed. Prox LAD to Mid LAD lesion is 20% stenosed. 1st Mrg lesion is 85% stenosed. 1st LPL-1 lesion is 95% stenosed. 1st LPL-2 lesion is 80% stenosed. LPAV-1 lesion is 30% stenosed. LPAV-2 lesion is 100% stenosed. Post intervention, there is a 0% residual stenosis. A drug-eluting stent was successfully placed using a SYNERGY XD 3.50X12. Post intervention, there is a 0% residual stenosis. A drug-eluting stent was successfully placed using a SYNERGY XD 2.25X16.   1. Successful IVUS guided orbital atherectomy and drug eluting stent placement to the ostial LAD. 2.  Successful angioplasty and drug-eluting stent placement to first PL branch of the left circumflex.    Assessment and Plan:  1. S/P CABG x 4   2. CAD in native artery   3. Mixed hyperlipidemia   4. Essential hypertension, benign   5. Type 2 diabetes mellitus with complication, without long-term current use  of insulin ([place])    1. S/P CABG x 4 Recent CABG x4 with (LIMA-LAD, SVG-Diagonal, Sequential SVG to OM1 and OM3).  Median sternotomy scar with chest tube sites and drainage site clean and dry.  Right endoscopic saphenous vein graft with wound well approximated with some clear drainage around endoscopic vein harvesting site and surrounding swelling and tightness in upper leg  with some ecchymosis noted in right upper inner thigh and edema noted below the knee.  Patient denies any anginal or exertional symptoms.  He has not been very active since surgery.  Follow-up with Dr. Roxan Hockey on June 28.  2. CAD in native artery Recent cardiac catheterization 08/15/2020 with proximal RCA lesion 70%, LPA VISA lesion 100%, ostial LAD to proximal LAD 95%, mid LAD lesion 80%, second marginal lesion 90%.  Previously placed first LPL stent widely patent.  As a result patient underwent four-vessel bypass as noted above.  Currently denies any anginal symptoms.  Continue aspirin 325 mg daily.  Increase carvedilol to 25 mg p.o. twice daily due to resting tachycardia of 105 on arrival.  3. Mixed hyperlipidemia Continue atorvastatin 80 mg p.o. daily.  Recent lipid panel 08/16/2020: TC 77, TG 128, HDL 21, LDL 30  4. Essential hypertension, benign Blood pressure today is 132/70.  Continue lisinopril 2.5 mg daily.  Carvedilol 25 mg p.o. twice daily.  5. Type 2 diabetes mellitus with complication, without long-term current use of insulin ([place]) Patient states his glucose is reasonably well controlled recently since discharge.  He continues on Lantus, Ozempic, aspartame insulin.    Medication Adjustments/Labs and Tests Ordered: Current medicines are reviewed at length with the patient today.  Concerns regarding medicines are outlined  above.   Disposition: Follow-up with scheduled visit August 11  Signed, Timmya Blazier Quinn, NP 09/12/2020 4:04 PM    [facility] at [place], [place], [place]  [address] Phone: [phone]; Fax: [phone]

## 2020-09-12 ENCOUNTER — Encounter: Payer: Self-pay | Admitting: Family Medicine

## 2020-09-12 ENCOUNTER — Other Ambulatory Visit: Payer: Self-pay

## 2020-09-12 ENCOUNTER — Ambulatory Visit: Payer: Medicare PPO | Admitting: Family Medicine

## 2020-09-12 VITALS — BP 132/70 | HR 105 | Ht 69.0 in | Wt 236.8 lb

## 2020-09-12 DIAGNOSIS — E118 Type 2 diabetes mellitus with unspecified complications: Secondary | ICD-10-CM

## 2020-09-12 DIAGNOSIS — I2510 Atherosclerotic heart disease of native coronary artery without angina pectoris: Secondary | ICD-10-CM | POA: Diagnosis not present

## 2020-09-12 DIAGNOSIS — I10 Essential (primary) hypertension: Secondary | ICD-10-CM | POA: Diagnosis not present

## 2020-09-12 DIAGNOSIS — Z951 Presence of aortocoronary bypass graft: Secondary | ICD-10-CM | POA: Diagnosis not present

## 2020-09-12 DIAGNOSIS — E782 Mixed hyperlipidemia: Secondary | ICD-10-CM

## 2020-09-12 MED ORDER — FUROSEMIDE 20 MG PO TABS: 20.0000 mg | ORAL_TABLET | Freq: Every day | ORAL | 0 refills | Status: DC

## 2020-09-12 MED ORDER — CARVEDILOL 25 MG PO TABS: 25.0000 mg | ORAL_TABLET | Freq: Two times a day (BID) | ORAL | 2 refills | Status: DC

## 2020-09-12 NOTE — Patient Instructions (Addendum)
Medication Instructions:  Increase Coreg to 25mg  daily.  Take Lasix 20mg  daily x 4 days only for swelling.  Continue all other medications.    Labwork: none  Testing/Procedures: none  Follow-Up: Keep August visit as scheduled.    Any Other Special Instructions Will Be Listed Below (If Applicable).  If you need a refill on your cardiac medications before your next appointment, please call your pharmacy.

## 2020-09-16 ENCOUNTER — Other Ambulatory Visit: Payer: Self-pay | Admitting: Nurse Practitioner

## 2020-09-16 DIAGNOSIS — Z794 Long term (current) use of insulin: Secondary | ICD-10-CM

## 2020-09-17 ENCOUNTER — Encounter: Payer: Self-pay | Admitting: Nurse Practitioner

## 2020-09-17 ENCOUNTER — Ambulatory Visit: Payer: Medicare PPO | Admitting: Nurse Practitioner

## 2020-09-17 ENCOUNTER — Other Ambulatory Visit: Payer: Self-pay

## 2020-09-17 VITALS — BP 124/74 | HR 95 | Temp 98.0°F | Resp 20 | Ht 69.0 in | Wt 234.0 lb

## 2020-09-17 DIAGNOSIS — Z09 Encounter for follow-up examination after completed treatment for conditions other than malignant neoplasm: Secondary | ICD-10-CM

## 2020-09-17 DIAGNOSIS — Z951 Presence of aortocoronary bypass graft: Secondary | ICD-10-CM

## 2020-09-17 NOTE — Progress Notes (Signed)
   Subjective:    Patient ID: [name] [name], male    DOB: [birth year], 65 y.o.   MRN: [id number]   Chief Complaint: Follow-up   HPI  Patient comes in for follow up today. He had emergency cardiac bypass surgery . He has surgery on 08/21/20. Prior to that he had had stent placement x2.  A few weeks after each stent placement he would develop chest pain.he had follow up with cardiology on 09/12/20. At that visit they increased his coreg to 25mg  daily and told him to take lasix on prn basis if he develops edema. H eis feeling better. Gradually getting energy. Not able to do much. He will start rehab in the next month or so. He follow up with cardiologist in august.   Review of Systems  Constitutional:  Negative for diaphoresis.  Eyes:  Negative for pain.  Respiratory:  Positive for shortness of breath (slight).   Cardiovascular:  Negative for chest pain, palpitations and leg swelling.  Gastrointestinal:  Negative for abdominal pain.  Endocrine: Negative for polydipsia.  Skin:  Negative for rash.  Neurological:  Negative for dizziness, weakness and headaches.  Hematological:  Does not bruise/bleed easily.  All other systems reviewed and are negative.     Objective:   Physical Exam Vitals and nursing note reviewed.  Constitutional:      Appearance: Normal appearance. He is well-developed.  Neck:     Thyroid: No thyroid mass or thyromegaly.     Vascular: No carotid bruit or JVD.     Trachea: Phonation normal.  Cardiovascular:     Rate and Rhythm: Normal rate and regular rhythm.  Pulmonary:     Effort: Pulmonary effort is normal. No respiratory distress.     Breath sounds: Rales (left lower quadrant) present.  Abdominal:     General: Bowel sounds are normal.     Palpations: Abdomen is soft.     Tenderness: There is no abdominal tenderness.  Musculoskeletal:        General: Normal range of motion.     Cervical back: Normal range of motion and neck supple.  Lymphadenopathy:      Cervical: No cervical adenopathy.  Skin:    General: Skin is warm and dry.  Neurological:     Mental Status: He is alert and oriented to person, place, and time.  Psychiatric:        Behavior: Behavior normal.        Thought Content: Thought content normal.        Judgment: Judgment normal.   BP 124/74   Pulse 95   Temp 98 F (36.7 C) (Temporal)   Resp 20   Ht 5\' 9"  (1.753 m)   Wt 234 lb (106.1 kg)   SpO2 96%   BMI 34.56 kg/m         Assessment & Plan:  [name] in today with chief complaint of Follow-up   1. S/P CABG x 4 Keep follow up appointment with cardiology Continue all current meds  2. Hospital discharge follow-up Hospital records reviewed    The above assessment and management plan was discussed with the patient. The patient verbalized understanding of and has agreed to the management plan. Patient is aware to call the clinic if symptoms persist or worsen. Patient is aware when to return to the clinic for a follow-up visit. Patient educated on when it is appropriate to go to the emergency department.   [name], FNP

## 2020-09-23 ENCOUNTER — Other Ambulatory Visit: Payer: Self-pay | Admitting: Thoracic Surgery (Cardiothoracic Vascular Surgery)

## 2020-09-23 DIAGNOSIS — Z951 Presence of aortocoronary bypass graft: Secondary | ICD-10-CM

## 2020-09-24 ENCOUNTER — Ambulatory Visit (INDEPENDENT_AMBULATORY_CARE_PROVIDER_SITE_OTHER): Payer: Self-pay | Admitting: Thoracic Surgery (Cardiothoracic Vascular Surgery)

## 2020-09-24 ENCOUNTER — Other Ambulatory Visit: Payer: Self-pay

## 2020-09-24 ENCOUNTER — Ambulatory Visit
Admission: RE | Admit: 2020-09-24 | Discharge: 2020-09-24 | Disposition: A | Discharge status: Home / Self Care | Payer: Medicare PPO | Source: Ambulatory Visit | Attending: Thoracic Surgery (Cardiothoracic Vascular Surgery) | Admitting: Thoracic Surgery (Cardiothoracic Vascular Surgery)

## 2020-09-24 VITALS — BP 114/70 | HR 96 | Resp 20 | Ht 69.0 in | Wt 234.0 lb

## 2020-09-24 DIAGNOSIS — Z951 Presence of aortocoronary bypass graft: Secondary | ICD-10-CM

## 2020-09-24 DIAGNOSIS — I2510 Atherosclerotic heart disease of native coronary artery without angina pectoris: Secondary | ICD-10-CM

## 2020-09-24 DIAGNOSIS — J90 Pleural effusion, not elsewhere classified: Secondary | ICD-10-CM | POA: Diagnosis not present

## 2020-09-24 DIAGNOSIS — I517 Cardiomegaly: Secondary | ICD-10-CM | POA: Diagnosis not present

## 2020-09-24 DIAGNOSIS — J9811 Atelectasis: Secondary | ICD-10-CM | POA: Diagnosis not present

## 2020-09-24 DIAGNOSIS — M47814 Spondylosis without myelopathy or radiculopathy, thoracic region: Secondary | ICD-10-CM | POA: Diagnosis not present

## 2020-09-24 MED ORDER — FUROSEMIDE 20 MG PO TABS: 20.0000 mg | ORAL_TABLET | Freq: Every day | ORAL | 1 refills | Status: DC

## 2020-09-24 MED ORDER — CARVEDILOL 12.5 MG PO TABS: 18.7500 mg | ORAL_TABLET | Freq: Two times a day (BID) | ORAL | 3 refills | Status: DC

## 2020-09-24 NOTE — Progress Notes (Signed)
[address]       [place], [address]             [phone]     HPI: 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  His history of coronary disease dating back over 15 years.  He presented with accelerating angina and had a catheterization which showed in-stent restenosis of the LAD and severe three-vessel disease.  I did coronary bypass grafting x 4 on 08/21/2020.  He did have small diffusely diseased target vessels.  Postoperatively he did well and went home on day 5.  He does have some soreness of his incision but has only had to take 1 pain pill in the last 2 weeks and that was over a week ago.  He has not had any recurrent angina.  He does complain that he tires quickly.  He has had some swelling in his legs.  No shortness of breath.  He does complain of dizziness since his Coreg dose was increased from 18.75 mg require to 25 mg twice daily.   Past Medical History:  Diagnosis Date   Anxiety    Coronary atherosclerosis of native coronary artery    DES distal circumflex 2005; DES LAD/diagonal bifurcation 09/2010; DES ostial LAD and DES left PL 01/2020   Essential hypertension    GERD (gastroesophageal reflux disease)    History of kidney stones    Mixed hyperlipidemia    Myocardial infarction ([place])    Anterolateral with VF arrest 7/12   Type 2 diabetes mellitus (HCC)      Current Outpatient Medications  Medication Sig Dispense Refill   Accu-Chek Softclix Lancets lancets USE TO CHECK BLOOD SUGAR UP TO 4 TIMES DAILY. 100 each 0   aspirin EC 325 MG EC tablet Take 1 tablet (325 mg total) by mouth daily. 30 tablet 0   atorvastatin (LIPITOR) 80 MG tablet Take 1 tablet (80 mg total) by mouth daily. 90 tablet 3   blood glucose  meter kit and supplies Dispense based on patient and insurance preference. Use up to four times daily as directed. (FOR ICD-10 E10.9, E11.9). 1 each 0   cetirizine (ZYRTEC) 10 MG tablet Take 10 mg by mouth daily.     Cinnamon 500 MG TABS Take 1,000 mg by mouth 2 (two) times daily.      CRANBERRY PO Take 4,200 mg by mouth 2 (two) times daily.      furosemide (LASIX) 20 MG tablet Take 1 tablet (20 mg total) by mouth daily. 30 tablet 1   GLOBAL EASE INJECT PEN NEEDLES 31G X 8 MM MISC Use up to 8 times a day Dx E11.8 800 each 3   glucose blood (ACCU-CHEK AVIVA PLUS) test strip Check BS up to 4 times a day dx E11.8 100 strip 11   Insulin Aspart FlexPen 100 UNIT/ML SOPN INJECT UP TO 45 UNITS EVERY DAY (Patient taking differently: Inject 45-75 Units into the skin 2 (two) times daily with a meal. INJECT 45-75 units subq with breakfast and supper. Sliding scale) 15 mL 11   insulin glargine (LANTUS SOLOSTAR) 100 UNIT/ML Solostar Pen 44u in morning and 75 at night (Patient taking differently: Inject 45-75 Units into the skin See admin instructions. 45 units in morning and 75 at night) 30 mL 3  lisinopril (ZESTRIL) 2.5 MG tablet Take 1 tablet (2.5 mg total) by mouth daily. 30 tablet 1   metFORMIN (GLUCOPHAGE) 500 MG tablet TAKE 2 TABLETS BY MOUTHTWICE DAILY. (Patient taking differently: Take 1,000 mg by mouth 2 (two) times daily with a meal. TAKE 2 TABLETS BY MOUTHTWICE DAILY.) 120 tablet 1   OZEMPIC, 0.25 OR 0.5 MG/DOSE, 2 MG/1.5ML SOPN INJECT 0.375 MLS (0.5 MG TOTAL) INTO THE SKIN ONCE A WEEK. 1.5 mL 0   pantoprazole (PROTONIX) 40 MG tablet Take 1 tablet (40 mg total) by mouth daily as needed (for acid reflux). 90 tablet 3   tamsulosin (FLOMAX) 0.4 MG CAPS capsule Take 1 capsule (0.4 mg total) by mouth at bedtime. 90 capsule 1   traMADol (ULTRAM) 50 MG tablet Take 1 tablet (50 mg total) by mouth every 6 (six) hours as needed for severe pain. 30 tablet 0   carvedilol (COREG) 12.5 MG tablet Take 1.5 tablets  (18.75 mg total) by mouth 2 (two) times daily with a meal. 60 tablet 3   No current facility-administered medications for this visit.    Physical Exam BP 114/70   Pulse 96   Resp 20   Ht _0  (1.753 m)   Wt 234 lb (106.1 kg)   SpO2 96% Comment: RA  BMI 34.50 kg/m  65 year old man in no acute distress Alert and oriented x3 with no focal deficits Lungs very slightly diminished at left base Cardiac regular rate and rhythm Sternum stable, incision clean dry and intact Leg incision healing well 1+ edema both lower extremities  Diagnostic Tests: CHEST - 2 VIEW   COMPARISON:  08/24/2020.   FINDINGS: Prior CABG. Cardiomegaly. Persistent left base atelectasis/infiltrate. Persistent small left pleural effusion. No pneumothorax. Degenerative change thoracic spine.   IMPRESSION: 1.  Prior CABG.  Stable cardiomegaly.   2. Persistent left base atelectasis/infiltrate and small left pleural effusion. Similar findings noted on prior exam.     Electronically Signed   By: Marcello Moores  Register   On: 09/24/2020 09:35 I personally reviewed the images and agree with the findings noted above.   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  His history of coronary disease dating back over 15 years.  He presented with accelerating angina and in-stent restenosis along with three-vessel disease.  He had coronary bypass grafting x4 on 08/21/2020.  He did have small diffusely diseased poor quality target vessels.  He is progressing as expected post bypass.  He is just getting to the point where he starting to get some more stamina.  He is not having much discomfort.  He may begin driving.  He should not lift anything over 10 pounds for another 2 weeks.  He does have a small left pleural effusion.   Options would include doing nothing, low-dose diuretic, or prednisone taper.  His diabetes is relatively difficult to manage and he has had issues with prednisone in the past.  So would like to avoid that.  I am going to start him on a low-dose of 20 mg of Lasix daily as he also has a little bit of edema in his legs.   Plan: Lasix 20 mg po daily Return in one month with repeat CXR  Melrose Nakayama, MD [facility] [phone]

## 2020-09-25 ENCOUNTER — Telehealth: Payer: Self-pay | Admitting: Nurse Practitioner

## 2020-09-25 MED ORDER — AMOXICILLIN 875 MG PO TABS: 875.0000 mg | ORAL_TABLET | Freq: Two times a day (BID) | ORAL | 0 refills | Status: DC

## 2020-09-25 NOTE — Telephone Encounter (Signed)
Saw cardiology yesterday. Was told he had earinfection and needed to contact his PCP.  Meds ordered this encounter  Medications   amoxicillin (AMOXIL) 875 MG tablet    Sig: Take 1 tablet (875 mg total) by mouth 2 (two) times daily. 1 po BID    Dispense:  20 tablet    Refill:  0    Order Specific Question:   Supervising Provider    Answer:   Noemi Chapel [[id number]]

## 2020-10-07 ENCOUNTER — Other Ambulatory Visit: Payer: Self-pay | Admitting: Nurse Practitioner

## 2020-10-09 DIAGNOSIS — Z794 Long term (current) use of insulin: Secondary | ICD-10-CM | POA: Diagnosis not present

## 2020-10-09 DIAGNOSIS — E119 Type 2 diabetes mellitus without complications: Secondary | ICD-10-CM | POA: Diagnosis not present

## 2020-10-09 DIAGNOSIS — H25813 Combined forms of age-related cataract, bilateral: Secondary | ICD-10-CM | POA: Diagnosis not present

## 2020-10-09 LAB — HM DIABETES EYE EXAM

## 2020-10-22 ENCOUNTER — Other Ambulatory Visit: Payer: Self-pay | Admitting: Family Medicine

## 2020-10-22 ENCOUNTER — Other Ambulatory Visit: Payer: Self-pay | Admitting: Physician Assistant

## 2020-10-25 ENCOUNTER — Other Ambulatory Visit: Payer: Self-pay | Admitting: Nurse Practitioner

## 2020-10-25 DIAGNOSIS — Z794 Long term (current) use of insulin: Secondary | ICD-10-CM

## 2020-10-25 DIAGNOSIS — E1159 Type 2 diabetes mellitus with other circulatory complications: Secondary | ICD-10-CM

## 2020-10-28 ENCOUNTER — Other Ambulatory Visit: Payer: Self-pay | Admitting: Thoracic Surgery (Cardiothoracic Vascular Surgery)

## 2020-10-28 DIAGNOSIS — Z951 Presence of aortocoronary bypass graft: Secondary | ICD-10-CM

## 2020-10-28 DIAGNOSIS — J309 Allergic rhinitis, unspecified: Secondary | ICD-10-CM | POA: Diagnosis not present

## 2020-10-28 DIAGNOSIS — E1122 Type 2 diabetes mellitus with diabetic chronic kidney disease: Secondary | ICD-10-CM | POA: Diagnosis not present

## 2020-10-28 DIAGNOSIS — Z794 Long term (current) use of insulin: Secondary | ICD-10-CM | POA: Diagnosis not present

## 2020-10-28 DIAGNOSIS — I129 Hypertensive chronic kidney disease with stage 1 through stage 4 chronic kidney disease, or unspecified chronic kidney disease: Secondary | ICD-10-CM | POA: Diagnosis not present

## 2020-10-28 DIAGNOSIS — I252 Old myocardial infarction: Secondary | ICD-10-CM | POA: Diagnosis not present

## 2020-10-28 DIAGNOSIS — E785 Hyperlipidemia, unspecified: Secondary | ICD-10-CM | POA: Diagnosis not present

## 2020-10-28 DIAGNOSIS — G8929 Other chronic pain: Secondary | ICD-10-CM | POA: Diagnosis not present

## 2020-10-28 DIAGNOSIS — I25119 Atherosclerotic heart disease of native coronary artery with unspecified angina pectoris: Secondary | ICD-10-CM | POA: Diagnosis not present

## 2020-10-29 ENCOUNTER — Ambulatory Visit
Admission: RE | Admit: 2020-10-29 | Discharge: 2020-10-29 | Disposition: A | Discharge status: Home / Self Care | Payer: Medicare PPO | Source: Ambulatory Visit | Attending: Thoracic Surgery (Cardiothoracic Vascular Surgery) | Admitting: Thoracic Surgery (Cardiothoracic Vascular Surgery)

## 2020-10-29 ENCOUNTER — Encounter: Payer: Self-pay | Admitting: Thoracic Surgery (Cardiothoracic Vascular Surgery)

## 2020-10-29 ENCOUNTER — Other Ambulatory Visit: Payer: Self-pay

## 2020-10-29 ENCOUNTER — Ambulatory Visit (INDEPENDENT_AMBULATORY_CARE_PROVIDER_SITE_OTHER): Payer: Self-pay | Admitting: Thoracic Surgery (Cardiothoracic Vascular Surgery)

## 2020-10-29 VITALS — BP 142/79 | HR 88 | Resp 20 | Ht 69.0 in | Wt 234.2 lb

## 2020-10-29 DIAGNOSIS — Z951 Presence of aortocoronary bypass graft: Secondary | ICD-10-CM | POA: Diagnosis not present

## 2020-10-29 DIAGNOSIS — J9811 Atelectasis: Secondary | ICD-10-CM | POA: Diagnosis not present

## 2020-10-29 DIAGNOSIS — J90 Pleural effusion, not elsewhere classified: Secondary | ICD-10-CM | POA: Diagnosis not present

## 2020-10-29 DIAGNOSIS — I2510 Atherosclerotic heart disease of native coronary artery without angina pectoris: Secondary | ICD-10-CM

## 2020-10-29 NOTE — Progress Notes (Signed)
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  He presented with accelerating angina.  Catheterization he was found to have severe three-vessel disease.  I did coronary bypass grafting x4 on 08/21/2020.  He had small diffusely diseased target vessels.  Postoperatively he did well and went home on day 5.  I saw him in the office on 09/24/2020.  Overall he was doing well but did have a small left pleural effusion.  We discussed her options and decided to put him on Lasix 20 mg daily.  He feels well.  He is anxious to increase his activities.  He wants to start using his riding lawn more.  He still has a little bit of swelling in his right ankle.  He has not had any problems with Lasix.  Past Medical History:  Diagnosis Date   Anxiety    Coronary atherosclerosis of native coronary artery    DES distal circumflex 2005; DES LAD/diagonal bifurcation 09/2010; DES ostial LAD and DES left PL 01/2020   Essential hypertension    GERD (gastroesophageal reflux disease)    History of kidney stones    Mixed hyperlipidemia    Myocardial infarction ([place])    Anterolateral with VF arrest 7/12   Type 2 diabetes mellitus (HCC)     Current Outpatient Medications  Medication Sig Dispense Refill   Accu-Chek Softclix Lancets lancets USE TO CHECK BLOOD SUGAR 4 TIMES DAILY. Dx E11.8 100 each PRN   aspirin EC 325 MG EC tablet Take 1 tablet (325 mg total) by mouth daily. 30 tablet 0   atorvastatin (LIPITOR) 80 MG tablet Take 1 tablet (80 mg total) by mouth daily. 90 tablet 3   blood glucose meter kit and supplies Dispense based on patient and insurance preference. Use up to four times daily as directed.  (FOR ICD-10 E10.9, E11.9). 1 each 0   carvedilol (COREG) 12.5 MG tablet Take 1.5 tablets (18.75 mg total) by mouth 2 (two) times daily with a meal. 60 tablet 3   cetirizine (ZYRTEC) 10 MG tablet Take 10 mg by mouth daily.     Cinnamon 500 MG TABS Take 1,000 mg by mouth 2 (two) times daily.      CRANBERRY PO Take 4,200 mg by mouth 2 (two) times daily.      furosemide (LASIX) 20 MG tablet Take 1 tablet (20 mg total) by mouth daily. 30 tablet 1   GLOBAL EASE INJECT PEN NEEDLES 31G X 8 MM MISC Use up to 8 times a day Dx E11.8 800 each 3   glucose blood (ACCU-CHEK AVIVA PLUS) test strip Check BS up to 4 times a day dx E11.8 100 strip 11   Insulin Aspart FlexPen 100 UNIT/ML SOPN INJECT UP TO 45 UNITS EVERY DAY (Patient taking differently: Inject 45-75 Units into the skin 2 (two) times daily with a meal. INJECT 45-75 units subq with breakfast and supper. Sliding scale) 15 mL 11   insulin glargine (LANTUS SOLOSTAR) 100 UNIT/ML Solostar Pen 44u in morning and 75 at night (Patient taking differently: Inject 45-75 Units into the skin See admin instructions. 45 units in morning and 75 at  night) 30 mL 3   lisinopril (ZESTRIL) 2.5 MG tablet TAKE 1 TABLET BY MOUTH ONCE DAILY. 90 tablet 2   metFORMIN (GLUCOPHAGE) 500 MG tablet TAKE 2 TABLETS BY MOUTH TWICE DAILY. 120 tablet 0   OZEMPIC, 0.25 OR 0.5 MG/DOSE, 2 MG/1.5ML SOPN INJECT 0.375 MLS (0.5 MG TOTAL) INTO THE SKIN ONCE A WEEK. 1.5 mL 0   pantoprazole (PROTONIX) 40 MG tablet Take 1 tablet (40 mg total) by mouth daily as needed (for acid reflux). 90 tablet 3   tamsulosin (FLOMAX) 0.4 MG CAPS capsule Take 1 capsule (0.4 mg total) by mouth at bedtime. 90 capsule 1   traMADol (ULTRAM) 50 MG tablet Take 1 tablet (50 mg total) by mouth every 6 (six) hours as needed for severe pain. 30 tablet 0   No current facility-administered medications for this visit.    Physical Exam BP (!) 142/79 (BP Location: Right Arm, Patient Position: Sitting, Cuff Size: Normal)   Pulse  88   Resp 20   Ht '5\' 9"'  (1.753 m)   Wt 234 lb 3.2 oz (106.2 kg)   SpO2 93% Comment: RA  BMI 34.51 kg/m  65 year old man in no acute distress Alert and oriented x3 with no focal deficits Lungs slightly diminished at left base but otherwise clear Sternum stable, incision well-healed Cardiac regular rate and rhythm  Diagnostic Tests: CHEST - 2 VIEW   COMPARISON:  09/24/2020   FINDINGS: Prior CABG. Small to moderate left pleural effusion, stable since prior study. Left base atelectasis. Right lung clear. Heart is borderline in size.   IMPRESSION: Stable small to moderate left pleural effusion with left base atelectasis.     Electronically Signed   By: Rolm Baptise M.D.   On: 10/29/2020 10:13 I personally reviewed the chest x-ray images.  There has been improvement of a small left pleural effusion compared to his prior film.  Impression: [name] [name] is a 65 year old man with a history of coronary disease, MI, cardiac arrest, multiple angioplasties, hypertension, dyslipidemia, type 2 insulin-dependent diabetes, reflux, and anxiety.  He presented with accelerating angina and was found to have three-vessel disease with in-stent restenosis.  Coronary artery bypass grafting x4-done on 08/21/2020.  Has recovered well.  He did have small diffusely diseased target vessels.  Importance of continued medical therapy was emphasized.  Left pleural effusion-had a small pleural effusion on his chest x-ray a little over a month ago.  Started him on Lasix 20 mg daily.  There i has been further decrease in the effusion, which is now minimal.  He has 3 more weeks of Lasix.  I recommended that he stay on that and then give a trial off of it.  If he notices his weight increase more than 3 pounds in a day or 5 pounds in 3 days he should call and we will get him started back on the 20 mg of Lasix.  Plan: Follow-up with Dr. Domenic Polite I will be happy to see [name] [name] back anytime in the future if I can  be of any further assistance with his care  Melrose Nakayama, MD [facility] [phone]

## 2020-11-06 ENCOUNTER — Encounter: Payer: Self-pay | Admitting: Nurse Practitioner

## 2020-11-06 ENCOUNTER — Other Ambulatory Visit: Payer: Self-pay

## 2020-11-06 ENCOUNTER — Ambulatory Visit: Payer: Medicare PPO | Admitting: Nurse Practitioner

## 2020-11-06 VITALS — BP 118/74 | HR 82 | Temp 98.0°F | Resp 20 | Ht 69.0 in | Wt 236.0 lb

## 2020-11-06 DIAGNOSIS — I10 Essential (primary) hypertension: Secondary | ICD-10-CM

## 2020-11-06 DIAGNOSIS — E118 Type 2 diabetes mellitus with unspecified complications: Secondary | ICD-10-CM | POA: Diagnosis not present

## 2020-11-06 DIAGNOSIS — Z23 Encounter for immunization: Secondary | ICD-10-CM

## 2020-11-06 DIAGNOSIS — E785 Hyperlipidemia, unspecified: Secondary | ICD-10-CM

## 2020-11-06 DIAGNOSIS — E1159 Type 2 diabetes mellitus with other circulatory complications: Secondary | ICD-10-CM | POA: Diagnosis not present

## 2020-11-06 DIAGNOSIS — R3911 Hesitancy of micturition: Secondary | ICD-10-CM

## 2020-11-06 DIAGNOSIS — I2510 Atherosclerotic heart disease of native coronary artery without angina pectoris: Secondary | ICD-10-CM

## 2020-11-06 DIAGNOSIS — Z794 Long term (current) use of insulin: Secondary | ICD-10-CM | POA: Diagnosis not present

## 2020-11-06 DIAGNOSIS — N401 Enlarged prostate with lower urinary tract symptoms: Secondary | ICD-10-CM | POA: Diagnosis not present

## 2020-11-06 DIAGNOSIS — K219 Gastro-esophageal reflux disease without esophagitis: Secondary | ICD-10-CM

## 2020-11-06 DIAGNOSIS — E1169 Type 2 diabetes mellitus with other specified complication: Secondary | ICD-10-CM

## 2020-11-06 DIAGNOSIS — Z6836 Body mass index (BMI) 36.0-36.9, adult: Secondary | ICD-10-CM

## 2020-11-06 LAB — BAYER DCA HB A1C WAIVED: HB A1C (BAYER DCA - WAIVED): 6.9 % (ref <7.0)

## 2020-11-06 MED ORDER — OZEMPIC (0.25 OR 0.5 MG/DOSE) 2 MG/1.5ML ~~LOC~~ SOPN: 2.0000 mg | PEN_INJECTOR | SUBCUTANEOUS | 5 refills | Status: DC

## 2020-11-06 MED ORDER — TAMSULOSIN HCL 0.4 MG PO CAPS: 0.4000 mg | ORAL_CAPSULE | Freq: Every day | ORAL | 1 refills | Status: DC

## 2020-11-06 MED ORDER — ATORVASTATIN CALCIUM 80 MG PO TABS: 80.0000 mg | ORAL_TABLET | Freq: Every day | ORAL | 3 refills | Status: DC

## 2020-11-06 MED ORDER — CARVEDILOL 12.5 MG PO TABS: 18.7500 mg | ORAL_TABLET | Freq: Two times a day (BID) | ORAL | 3 refills | Status: DC

## 2020-11-06 MED ORDER — PANTOPRAZOLE SODIUM 40 MG PO TBEC: 40.0000 mg | DELAYED_RELEASE_TABLET | Freq: Every day | ORAL | 3 refills | Status: DC | PRN

## 2020-11-06 MED ORDER — LISINOPRIL 2.5 MG PO TABS: 2.5000 mg | ORAL_TABLET | Freq: Every day | ORAL | 2 refills | Status: DC

## 2020-11-06 MED ORDER — METFORMIN HCL 500 MG PO TABS: ORAL_TABLET | ORAL | 0 refills | Status: DC

## 2020-11-06 MED ORDER — LANTUS SOLOSTAR 100 UNIT/ML ~~LOC~~ SOPN: PEN_INJECTOR | SUBCUTANEOUS | 3 refills | Status: DC

## 2020-11-06 MED ORDER — FUROSEMIDE 20 MG PO TABS: 20.0000 mg | ORAL_TABLET | Freq: Every day | ORAL | 1 refills | Status: DC

## 2020-11-06 NOTE — Patient Instructions (Signed)
https://www.diabeteseducator.org/docs/default-source/living-with-diabetes/conquering-the-grocery-store-v1.pdf?sfvrsn=4">  Carbohydrate Counting for Diabetes Mellitus, Adult Carbohydrate counting is a method of keeping track of how many carbohydrates you eat. Eating carbohydrates naturally increases the amount of sugar (glucose) in the blood. Counting how many carbohydrates you eat improves your bloodglucose control, which helps you manage your diabetes. It is important to know how many carbohydrates you can safely have in each meal. This is different for every person. A dietitian can help you make a meal plan and calculate how many carbohydrates you should have at each meal andsnack. What foods contain carbohydrates? Carbohydrates are found in the following foods: Grains, such as breads and cereals. Dried beans and soy products. Starchy vegetables, such as potatoes, peas, and corn. Fruit and fruit juices. Milk and yogurt. Sweets and snack foods, such as cake, cookies, candy, chips, and soft drinks. How do I count carbohydrates in foods? There are two ways to count carbohydrates in food. You can read food labels or learn standard serving sizes of foods. You can use either of the methods or acombination of both. Using the Nutrition Facts label The Nutrition Facts list is included on the labels of almost all packaged foods and beverages in the [place] It includes: The serving size. Information about nutrients in each serving, including the grams (g) of carbohydrate per serving. To use the Nutrition Facts: Decide how many servings you will have. Multiply the number of servings by the number of carbohydrates per serving. The resulting number is the total amount of carbohydrates that you will be having. Learning the standard serving sizes of foods When you eat carbohydrate foods that are not packaged or do not include Nutrition Facts on the label, you need to measure the servings in order to count the  amount of carbohydrates. Measure the foods that you will eat with a food scale or measuring cup, if needed. Decide how many standard-size servings you will eat. Multiply the number of servings by 15. For foods that contain carbohydrates, one serving equals 15 g of carbohydrates. For example, if you eat 2 cups or 10 oz (300 g) of strawberries, you will have eaten 2 servings and 30 g of carbohydrates (2 servings x 15 g = 30 g). For foods that have more than one food mixed, such as soups and casseroles, you must count the carbohydrates in each food that is included. The following list contains standard serving sizes of common carbohydrate-rich foods. Each of these servings has about 15 g of carbohydrates: 1 slice of bread. 1 six-inch (15 cm) tortilla. ? cup or 2 oz (53 g) cooked rice or pasta.  cup or 3 oz (85 g) cooked or canned, drained and rinsed beans or lentils.  cup or 3 oz (85 g) starchy vegetable, such as peas, corn, or squash.  cup or 4 oz (120 g) hot cereal.  cup or 3 oz (85 g) boiled or mashed potatoes, or  or 3 oz (85 g) of a large baked potato.  cup or 4 fl oz (118 mL) fruit juice. 1 cup or 8 fl oz (237 mL) milk. 1 small or 4 oz (106 g) apple.  or 2 oz (63 g) of a medium banana. 1 cup or 5 oz (150 g) strawberries. 3 cups or 1 oz (24 g) popped popcorn. What is an example of carbohydrate counting? To calculate the number of carbohydrates in this sample meal, follow the stepsshown below. Sample meal 3 oz (85 g) chicken breast. ? cup or 4 oz (106 g) brown rice.    cup or 3 oz (85 g) corn. 1 cup or 8 fl oz (237 mL) milk. 1 cup or 5 oz (150 g) strawberries with sugar-free whipped topping. Carbohydrate calculation Identify the foods that contain carbohydrates: Rice. Corn. Milk. Strawberries. Calculate how many servings you have of each food: 2 servings rice. 1 serving corn. 1 serving milk. 1 serving strawberries. Multiply each number of servings by 15 g: 2 servings  rice x 15 g = 30 g. 1 serving corn x 15 g = 15 g. 1 serving milk x 15 g = 15 g. 1 serving strawberries x 15 g = 15 g. Add together all of the amounts to find the total grams of carbohydrates eaten: 30 g + 15 g + 15 g + 15 g = 75 g of carbohydrates total. What are tips for following this plan? Shopping Develop a meal plan and then make a shopping list. Buy fresh and frozen vegetables, fresh and frozen fruit, dairy, eggs, beans, lentils, and whole grains. Look at food labels. Choose foods that have more fiber and less sugar. Avoid processed foods and foods with added sugars. Meal planning Aim to have the same amount of carbohydrates at each meal and for each snack time. Plan to have regular, balanced meals and snacks. Where to find more information American Diabetes Association: www.diabetes.org Centers for Disease Control and Prevention: www.cdc.gov Summary Carbohydrate counting is a method of keeping track of how many carbohydrates you eat. Eating carbohydrates naturally increases the amount of sugar (glucose) in the blood. Counting how many carbohydrates you eat improves your blood glucose control, which helps you manage your diabetes. A dietitian can help you make a meal plan and calculate how many carbohydrates you should have at each meal and snack. This information is not intended to replace advice given to you by your health care provider. Make sure you discuss any questions you have with your healthcare provider. Document Revised: 03/16/2019 Document Reviewed: 03/17/2019 Elsevier Patient Education  2021 [employer]  

## 2020-11-06 NOTE — Progress Notes (Signed)
Subjective:    Patient ID: [name] [name], male    DOB: 04-Sep-1955, 65 y.o.   MRN: [id number]   Chief Complaint: medical management of chronic issues     HPI:  1. Essential hypertension, benign No c/o chest pain, sob or headache. Does not check blood pressure at home. BP Readings from Last 3 Encounters:  10/29/20 (!) 142/79  09/24/20 114/70  09/17/20 124/74     2. Hyperlipidemia with target LDL less than 70 Does watch det and is currently not exercising.  3. Type 2 diabetes mellitus with other circulatory complication, with long-term current use of insulin (HCC) Fasting blood sugars are running around  4. Coronary artery disease involving native coronary artery of native heart without angina pectoris Has had several heart attacks in the last 6 months. He has had bypass surgery twice. His last surgery was 08/21/20. He has been much better since last surgery. He has not had any chest pain since last surgery.He saw cardiac surgeon on  on 10/29/20. He was released to follow up with cardiologist. Has not started rehab yet.  5. Hyperlipidemia associated with type 2 diabetes mellitus ([place]) Again does watch diet not exercising any right now  6. Gastroesophageal reflux disease without esophagitis is on protonix daily and is doing well.  8. Benign prostatic hyperplasia with urinary hesitancy No problems voiding  9. BMI 36.0-36.9,adult No recent weight changes Wt Readings from Last 3 Encounters:  11/06/20 236 lb (107 kg)  10/29/20 234 lb 3.2 oz (106.2 kg)  09/24/20 234 lb (106.1 kg)   BMI Readings from Last 3 Encounters:  11/06/20 34.85 kg/m  10/29/20 34.59 kg/m  09/24/20 34.56 kg/m       Outpatient Encounter Medications as of 11/06/2020  Medication Sig   Accu-Chek Softclix Lancets lancets USE TO CHECK BLOOD SUGAR 4 TIMES DAILY. Dx E11.8   aspirin EC 325 MG EC tablet Take 1 tablet (325 mg total) by mouth daily.   atorvastatin (LIPITOR) 80 MG tablet Take 1 tablet (80 mg  total) by mouth daily.   blood glucose meter kit and supplies Dispense based on patient and insurance preference. Use up to four times daily as directed. (FOR ICD-10 E10.9, E11.9).   carvedilol (COREG) 12.5 MG tablet Take 1.5 tablets (18.75 mg total) by mouth 2 (two) times daily with a meal.   cetirizine (ZYRTEC) 10 MG tablet Take 10 mg by mouth daily.   Cinnamon 500 MG TABS Take 1,000 mg by mouth 2 (two) times daily.    CRANBERRY PO Take 4,200 mg by mouth 2 (two) times daily.    furosemide (LASIX) 20 MG tablet Take 1 tablet (20 mg total) by mouth daily.   GLOBAL EASE INJECT PEN NEEDLES 31G X 8 MM MISC Use up to 8 times a day Dx E11.8   glucose blood (ACCU-CHEK AVIVA PLUS) test strip Check BS up to 4 times a day dx E11.8   Insulin Aspart FlexPen 100 UNIT/ML SOPN INJECT UP TO 45 UNITS EVERY DAY (Patient taking differently: Inject 45-75 Units into the skin 2 (two) times daily with a meal. INJECT 45-75 units subq with breakfast and supper. Sliding scale)   insulin glargine (LANTUS SOLOSTAR) 100 UNIT/ML Solostar Pen 44u in morning and 75 at night (Patient taking differently: Inject 45-75 Units into the skin See admin instructions. 45 units in morning and 75 at night)   lisinopril (ZESTRIL) 2.5 MG tablet TAKE 1 TABLET BY MOUTH ONCE DAILY.   metFORMIN (GLUCOPHAGE) 500 MG tablet TAKE  2 TABLETS BY MOUTH TWICE DAILY.   OZEMPIC, 0.25 OR 0.5 MG/DOSE, 2 MG/1.5ML SOPN INJECT 0.375 MLS (0.5 MG TOTAL) INTO THE SKIN ONCE A WEEK.   pantoprazole (PROTONIX) 40 MG tablet Take 1 tablet (40 mg total) by mouth daily as needed (for acid reflux).   tamsulosin (FLOMAX) 0.4 MG CAPS capsule Take 1 capsule (0.4 mg total) by mouth at bedtime.   traMADol (ULTRAM) 50 MG tablet Take 1 tablet (50 mg total) by mouth every 6 (six) hours as needed for severe pain.   No facility-administered encounter medications on file as of 11/06/2020.    Past Surgical History:  Procedure Laterality Date   CARDIAC CATHETERIZATION  2012    CAROTID STENT     stents x 2    CHOLECYSTECTOMY N/A 12/04/2015   Procedure: LAPAROSCOPIC CHOLECYSTECTOMY;  Surgeon: Aviva Signs, MD;  Location: [facility];  Service: General;  Laterality: N/A;   CHOLECYSTECTOMY, LAPAROSCOPIC  12/05/2015   CORONARY ANGIOPLASTY  2012   STENT X 1 2012, STENT X 1 YRS BEFORE   CORONARY ARTERY BYPASS GRAFT N/A 08/21/2020   Procedure: CORONARY ARTERY BYPASS GRAFTING (CABG) X 4, USING LEFT INTERNAL MAMMARY ARTERY AND RIGHT GREATER SAPHENOUS VEIN HARVESTED ENDOSCOPICALLY. SVG TO OM1, OM3 SEQUENTIALLY, SVG TO DIAG., LIMA TO LAD;  Surgeon: Melrose Nakayama, MD;  Location: [facility];  Service: Open Heart Surgery;  Laterality: N/A;   CORONARY ATHERECTOMY N/A 02/09/2020   Procedure: CORONARY ATHERECTOMY;  Surgeon: Wellington Hampshire, MD;  Location: [place] [facility];  Service: Cardiovascular;  Laterality: N/A;   CORONARY STENT INTERVENTION N/A 02/09/2020   Procedure: CORONARY STENT INTERVENTION;  Surgeon: Wellington Hampshire, MD;  Location: [place] [facility];  Service: Cardiovascular;  Laterality: N/A;   CORONARY STENT INTERVENTION N/A 05/22/2020   Procedure: CORONARY STENT INTERVENTION;  Surgeon: Jettie Booze, MD;  Location: [place] [facility];  Service: Cardiovascular;  Laterality: N/A;   ENDOVEIN HARVEST OF GREATER SAPHENOUS VEIN Right 08/21/2020   Procedure: ENDOVEIN HARVEST OF GREATER SAPHENOUS VEIN;  Surgeon: Melrose Nakayama, MD;  Location: [place];  Service: Open Heart Surgery;  Laterality: Right;   HYDROCELE EXCISION Bilateral 06/02/2017   Procedure: HYDROCELECTOMY ADULT;  Surgeon: Ceasar Mons, MD;  Location: [facility];  Service: Urology;  Laterality: Bilateral;  ONLY NEEDS 45 MIN   INGUINAL HERNIA REPAIR     RIGHT GROIN   INTRAVASCULAR IMAGING/OCT N/A 05/22/2020   Procedure: INTRAVASCULAR IMAGING/OCT;  Surgeon: Jettie Booze, MD;  Location: [place] [facility];  Service: Cardiovascular;  Laterality: N/A;   INTRAVASCULAR ULTRASOUND/IVUS N/A  02/09/2020   Procedure: Intravascular Ultrasound/IVUS;  Surgeon: Wellington Hampshire, MD;  Location: [place] [facility];  Service: Cardiovascular;  Laterality: N/A;   KNEE ARTHROSCOPY Left 05/23/2019   Procedure: LEFT KNEE ARTHROSCOPY AND DEBRIDEMENT PARTIAL MEDIAL MENISECTOMY;  Surgeon: Newt Minion, MD;  Location: [place];  Service: Orthopedics;  Laterality: Left;   LEFT HEART CATH AND CORONARY ANGIOGRAPHY N/A 02/07/2020   Procedure: LEFT HEART CATH AND CORONARY ANGIOGRAPHY;  Surgeon: Leonie Man, MD;  Location: [place] [facility];  Service: Cardiovascular;  Laterality: N/A;   LEFT HEART CATH AND CORONARY ANGIOGRAPHY N/A 05/22/2020   Procedure: LEFT HEART CATH AND CORONARY ANGIOGRAPHY;  Surgeon: Jettie Booze, MD;  Location: [place] [facility];  Service: Cardiovascular;  Laterality: N/A;   LEFT HEART CATH AND CORONARY ANGIOGRAPHY N/A 08/15/2020   Procedure: LEFT HEART CATH AND CORONARY ANGIOGRAPHY;  Surgeon: Quay Burow  J, MD;  Location: [place] [facility];  Service: Cardiovascular;  Laterality: N/A;   TEE WITHOUT CARDIOVERSION N/A 08/21/2020   Procedure: TRANSESOPHAGEAL ECHOCARDIOGRAM (TEE);  Surgeon: Melrose Nakayama, MD;  Location: [place];  Service: Open Heart Surgery;  Laterality: N/A;   TRIGGER FINGER RELEASE Right 01/08/2015   Procedure: RELEASE TRIGGER FINGER/A-1 PULLEY RIGHT MIDDLE FINGER;  Surgeon: Daryll Brod, MD;  Location: [place];  Service: Orthopedics;  Laterality: Right;    Family History  Problem Relation Age of Onset   Hyperlipidemia Mother    Hypertension Mother    Hyperlipidemia Father    Hypertension Father    Diabetes Father    Dementia Father    Diabetes Maternal Grandfather     New complaints: None today  Social history: Lives with his wife. Is retired from work  Controlled substance contract: n/a     Review of Systems  Constitutional:  Negative for diaphoresis.  Eyes:  Negative for pain.   Respiratory:  Negative for shortness of breath.   Cardiovascular:  Negative for chest pain, palpitations and leg swelling.  Gastrointestinal:  Negative for abdominal pain.  Endocrine: Negative for polydipsia.  Skin:  Negative for rash.  Neurological:  Negative for dizziness, weakness and headaches.  Hematological:  Does not bruise/bleed easily.  All other systems reviewed and are negative.     Objective:   Physical Exam Vitals and nursing note reviewed.  Constitutional:      Appearance: Normal appearance. He is well-developed.  HENT:     Head: Normocephalic.     Nose: Nose normal.  Eyes:     Pupils: Pupils are equal, round, and reactive to light.  Neck:     Thyroid: No thyroid mass or thyromegaly.     Vascular: No carotid bruit or JVD.     Trachea: Phonation normal.  Cardiovascular:     Rate and Rhythm: Normal rate and regular rhythm.  Pulmonary:     Effort: Pulmonary effort is normal. No respiratory distress.     Breath sounds: Normal breath sounds.  Abdominal:     General: Bowel sounds are normal.     Palpations: Abdomen is soft.     Tenderness: There is no abdominal tenderness.  Musculoskeletal:        General: Normal range of motion.     Cervical back: Normal range of motion and neck supple.  Lymphadenopathy:     Cervical: No cervical adenopathy.  Skin:    General: Skin is warm and dry.  Neurological:     Mental Status: He is alert and oriented to person, place, and time.  Psychiatric:        Behavior: Behavior normal.        Thought Content: Thought content normal.        Judgment: Judgment normal.    BP 118/74   Pulse 82   Temp 98 F (36.7 C) (Temporal)   Resp 20   Ht '5\' 9"'  (1.753 m)   Wt 236 lb (107 kg)   SpO2 96%   BMI 34.85 kg/m   Hgba1c 6.9%     Assessment & Plan:   [name] [name] comes in today with chief complaint of Medical Management of Chronic Issues   Diagnosis and orders addressed:  1. Essential hypertension, benign Low sodium  diet - CBC with Differential/Platelet - CMP14+EGFR - lisinopril (ZESTRIL) 2.5 MG tablet; Take 1 tablet (2.5 mg total) by mouth daily.  Dispense: 90 tablet; Refill: 2  2. Hyperlipidemia  with target LDL less than 70 Low fat diet - Lipid panel - atorvastatin (LIPITOR) 80 MG tablet; Take 1 tablet (80 mg total) by mouth daily.  Dispense: 90 tablet; Refill: 3  3. Type 2 diabetes mellitus with other circulatory complication, with long-term current use of insulin (HCC) Continue to watch carbs in diet - Bayer DCA Hb A1c Waived - metFORMIN (GLUCOPHAGE) 500 MG tablet; TAKE 2 TABLETS BY MOUTH TWICE DAILY.  Dispense: 120 tablet; Refill: 0 - Semaglutide,0.25 or 0.5MG/DOS, (OZEMPIC, 0.25 OR 0.5 MG/DOSE,) 2 MG/1.5ML SOPN; Inject 2 mg into the skin once a week.  Dispense: 6 mL; Refill: 5 - insulin glargine (LANTUS SOLOSTAR) 100 UNIT/ML Solostar Pen; 44u in morning and 75 at night  Dispense: 30 mL; Refill: 3  4. Coronary artery disease involving native coronary artery of native heart without angina pectoris Keep follow up with cardiology - carvedilol (COREG) 12.5 MG tablet; Take 1.5 tablets (18.75 mg total) by mouth 2 (two) times daily with a meal.  Dispense: 60 tablet; Refill: 3 - furosemide (LASIX) 20 MG tablet; Take 1 tablet (20 mg total) by mouth daily.  Dispense: 30 tablet; Refill: 1  5. Hyperlipidemia associated with type 2 diabetes mellitus (HCC) Low fat diet  6. Gastroesophageal reflux disease without esophagitis Avoid spicy foods Do not eat 2 hours prior to bedtime - pantoprazole (PROTONIX) 40 MG tablet; Take 1 tablet (40 mg total) by mouth daily as needed (for acid reflux).  Dispense: 90 tablet; Refill: 3  7. Benign prostatic hyperplasia with urinary hesitancy  - tamsulosin (FLOMAX) 0.4 MG CAPS capsule; Take 1 capsule (0.4 mg total) by mouth at bedtime.  Dispense: 90 capsule; Refill: 1  9. BMI 36.0-36.9,adult Discussed diet and exercise for person with BMI >25 Will recheck weight in 3-6  months    Labs pending Health Maintenance reviewed Diet and exercise encouraged  Follow up plan: 3 months   [name], FNP

## 2020-11-06 NOTE — Addendum Note (Signed)
Addended by: Rolena Infante on: 11/06/2020 01:12 PM   Modules accepted: Orders

## 2020-11-06 NOTE — Progress Notes (Signed)
Cardiology Office Note  Date: 11/07/2020   ID: Audi, Wettstein 1956-01-10, MRN [id number]  PCP:  Chevis Pretty, FNP  Cardiologist:  Rozann Lesches, MD Electrophysiologist:  None   Chief Complaint: 3 month follow up post CABG  History of Present Illness: [name] [name] is a 65 y.o. male with a history of CAD, MR, multiple PCI's, HTN, HLD, IDDM, GERD, anxiety. Status post CABG x 4 (LIMA-LAD, SVG-Diagonal, Sequential SVG to OM1 and OM3)  Previous catheterization on 05/22/2020: Proximal RCA lesion 70%, distal LAD 60%, first marginal 85%, first LPL-2 lesion 80%, previously placed first LPL 1 DES widely patent, LPA V-2 lesion 100% stenosed.  Distal LM to proximal LAD lesion 90% stenosed with in-stent restenosis.  Treated with 3.25 mm Wolverine then 4.0 [place] balloon.  Dissection noted by OCT past the edge of old stent.  DES was cephalic successfully placed using a Synergy XD 3.50 x 12, postdilated to 4.0 mm.  Post intervention there was 0% residual stenosis.  Stent was optimized with OCT.  Mid LAD lesion 120% stenosis.  mL ID lesion to 75% stenosis.  In-stent restenosis.  Scoring balloon angioplasty performed using balloon overlying 3.25 x 10.  Post intervention 0% residual stenosis.  Cardiac catheterization on 08/15/2020: Proximal RCA lesion 70%, RPA V lesion 100%, ostial LAD to proximal LAD 95% stenosed, mid LAD lesion 80% stenosed, second marginal lesion 90% stenosed, previously placed first LPA stent widely patent.  Dr. Gwenlyn Found mentioned he would need coronary artery bypass graft for complete revascularization given her aggressive early in-stent restenosis.  He underwent bypass by Dr. Modesto Charon cardiothoracic surgery on 08/21/2020 with LIMA-LAD, SVG-diagonal, sequential SVG to OM1 and OM 3 with endoscopic vein harvest right thigh.  He was doing well and just felt tired.  He had some resting tachycardia at 105 on arrival..  He denied any issues other than problems sleeping due to  median sternotomy and having problems getting comfortable trying to sleep flat on his back.  He was sleeping sitting up in a recliner at that time.  He stated his median sternotomy site and chest tube insertion sites looked good.  Had some mild clear drainage from right endoscopic vein harvest area.  Did have some swelling in that area as well.  He stated he was discharged on a few days worth of Lasix.  He denied any anginal or exertional symptoms, orthostatic symptoms, CVA or TIA-like symptoms, PND, orthopnea, bleeding.  Had some edema and has had ight upper leg and right lower leg with some ecchymosis and right upper inner thigh.  His sugars were running a little better.  He had not had to use his short acting insulin recently.  He saw Dr Roxan Hockey on 10/29/2020. He was continuing Lasix for small pleural effusion with 3 more weeks left on Lasix course. Dr. Roxan Hockey recommended a trial off Lasix and if he noticed weight gain of 3 lbs in a day or 5 lbs in 3 days he should call.  He is here for 38-monthfollow-up status post CABG.  He states he is feeling relatively well but feels tired a fair amount of time.  He continues to sleep in his recliner due to discomfort from his surgery site.  He recently saw his PCP who drew multiple labs.  He continues on Lasix for a little longer due to lower extremity edema and weight gain.  States he has a little bit left before he stops the medication.  His blood pressure is well controlled  today 124/76.  Weight today is 233 which is a weight loss of 3 pounds since yesterday.  He states his median sternotomy site and right endoscopic vein harvesting site look good.  He denies any anginal symptoms, exertional symptoms, orthostatic symptoms, CVA or TIA-like symptoms, PND, orthopnea, bleeding.  No claudication-like symptoms, DVT or PE-like symptoms, or lower extremity edema.  He states Dr. Roxan Hockey has released him from his care.  Had recent lab work with PCP yesterday  hemoglobin A1c had improved at 6.9%.  Hemoglobin was 11.7, hematocrit 37.2, total cholesterol 87, HDL 19, triglycerides 198, LDL 36.  LFTs were normal.  Glucose was slightly elevated at 118.  Past Medical History:  Diagnosis Date   Anxiety    Coronary atherosclerosis of native coronary artery    DES distal circumflex 2005; DES LAD/diagonal bifurcation 09/2010; DES ostial LAD and DES left PL 01/2020   Essential hypertension    GERD (gastroesophageal reflux disease)    History of kidney stones    Mixed hyperlipidemia    Myocardial infarction ([place])    Anterolateral with VF arrest 7/12   Type 2 diabetes mellitus ([place])     Past Surgical History:  Procedure Laterality Date   CARDIAC CATHETERIZATION  2012   CAROTID STENT     stents x 2    CHOLECYSTECTOMY N/A 12/04/2015   Procedure: LAPAROSCOPIC CHOLECYSTECTOMY;  Surgeon: Aviva Signs, MD;  Location: [facility];  Service: General;  Laterality: N/A;   CHOLECYSTECTOMY, LAPAROSCOPIC  12/05/2015   CORONARY ANGIOPLASTY  2012   STENT X 1 2012, STENT X 1 YRS BEFORE   CORONARY ARTERY BYPASS GRAFT N/A 08/21/2020   Procedure: CORONARY ARTERY BYPASS GRAFTING (CABG) X 4, USING LEFT INTERNAL MAMMARY ARTERY AND RIGHT GREATER SAPHENOUS VEIN HARVESTED ENDOSCOPICALLY. SVG TO OM1, OM3 SEQUENTIALLY, SVG TO DIAG., LIMA TO LAD;  Surgeon: Melrose Nakayama, MD;  Location: [facility];  Service: Open Heart Surgery;  Laterality: N/A;   CORONARY ATHERECTOMY N/A 02/09/2020   Procedure: CORONARY ATHERECTOMY;  Surgeon: Wellington Hampshire, MD;  Location: [facility];  Service: Cardiovascular;  Laterality: N/A;   CORONARY STENT INTERVENTION N/A 02/09/2020   Procedure: CORONARY STENT INTERVENTION;  Surgeon: Wellington Hampshire, MD;  Location: [facility];  Service: Cardiovascular;  Laterality: N/A;   CORONARY STENT INTERVENTION N/A 05/22/2020   Procedure: CORONARY STENT INTERVENTION;  Surgeon: Jettie Booze, MD;  Location: [facility];  Service:  Cardiovascular;  Laterality: N/A;   ENDOVEIN HARVEST OF GREATER SAPHENOUS VEIN Right 08/21/2020   Procedure: ENDOVEIN HARVEST OF GREATER SAPHENOUS VEIN;  Surgeon: Melrose Nakayama, MD;  Location: [facility];  Service: Open Heart Surgery;  Laterality: Right;   HYDROCELE EXCISION Bilateral 06/02/2017   Procedure: HYDROCELECTOMY ADULT;  Surgeon: Ceasar Mons, MD;  Location: [facility];  Service: Urology;  Laterality: Bilateral;  ONLY NEEDS 45 MIN   INGUINAL HERNIA REPAIR     RIGHT GROIN   INTRAVASCULAR IMAGING/OCT N/A 05/22/2020   Procedure: INTRAVASCULAR IMAGING/OCT;  Surgeon: Jettie Booze, MD;  Location: [facility];  Service: Cardiovascular;  Laterality: N/A;   INTRAVASCULAR ULTRASOUND/IVUS N/A 02/09/2020   Procedure: Intravascular Ultrasound/IVUS;  Surgeon: Wellington Hampshire, MD;  Location: [facility];  Service: Cardiovascular;  Laterality: N/A;   KNEE ARTHROSCOPY Left 05/23/2019   Procedure: LEFT KNEE ARTHROSCOPY AND DEBRIDEMENT PARTIAL MEDIAL MENISECTOMY;  Surgeon: Newt Minion, MD;  Location: [facility];  Service: Orthopedics;  Laterality: Left;   LEFT HEART  CATH AND CORONARY ANGIOGRAPHY N/A 02/07/2020   Procedure: LEFT HEART CATH AND CORONARY ANGIOGRAPHY;  Surgeon: Leonie Man, MD;  Location: [facility];  Service: Cardiovascular;  Laterality: N/A;   LEFT HEART CATH AND CORONARY ANGIOGRAPHY N/A 05/22/2020   Procedure: LEFT HEART CATH AND CORONARY ANGIOGRAPHY;  Surgeon: Jettie Booze, MD;  Location: [facility];  Service: Cardiovascular;  Laterality: N/A;   LEFT HEART CATH AND CORONARY ANGIOGRAPHY N/A 08/15/2020   Procedure: LEFT HEART CATH AND CORONARY ANGIOGRAPHY;  Surgeon: Lorretta Harp, MD;  Location: [facility];  Service: Cardiovascular;  Laterality: N/A;   TEE WITHOUT CARDIOVERSION N/A 08/21/2020   Procedure: TRANSESOPHAGEAL ECHOCARDIOGRAM (TEE);  Surgeon: Melrose Nakayama, MD;  Location: [facility];   Service: Open Heart Surgery;  Laterality: N/A;   TRIGGER FINGER RELEASE Right 01/08/2015   Procedure: RELEASE TRIGGER FINGER/A-1 PULLEY RIGHT MIDDLE FINGER;  Surgeon: Daryll Brod, MD;  Location: [facility];  Service: Orthopedics;  Laterality: Right;    Current Outpatient Medications  Medication Sig Dispense Refill   Accu-Chek Softclix Lancets lancets USE TO CHECK BLOOD SUGAR 4 TIMES DAILY. Dx E11.8 100 each PRN   aspirin EC 325 MG EC tablet Take 1 tablet (325 mg total) by mouth daily. 30 tablet 0   atorvastatin (LIPITOR) 80 MG tablet Take 1 tablet (80 mg total) by mouth daily. 90 tablet 3   blood glucose meter kit and supplies Dispense based on patient and insurance preference. Use up to four times daily as directed. (FOR ICD-10 E10.9, E11.9). 1 each 0   carvedilol (COREG) 12.5 MG tablet Take 1.5 tablets (18.75 mg total) by mouth 2 (two) times daily with a meal. 60 tablet 3   cetirizine (ZYRTEC) 10 MG tablet Take 10 mg by mouth daily.     Cinnamon 500 MG TABS Take 1,000 mg by mouth 2 (two) times daily.      CRANBERRY PO Take 4,200 mg by mouth 2 (two) times daily.      furosemide (LASIX) 20 MG tablet Take 1 tablet (20 mg total) by mouth daily. 30 tablet 1   GLOBAL EASE INJECT PEN NEEDLES 31G X 8 MM MISC Use up to 8 times a day Dx E11.8 800 each 3   glucose blood (ACCU-CHEK AVIVA PLUS) test strip Check BS up to 4 times a day dx E11.8 100 strip 11   Insulin Aspart FlexPen 100 UNIT/ML SOPN INJECT UP TO 45 UNITS EVERY DAY (Patient taking differently: Inject 45-75 Units into the skin 2 (two) times daily with a meal. INJECT 45-75 units subq with breakfast and supper. Sliding scale) 15 mL 11   insulin glargine (LANTUS SOLOSTAR) 100 UNIT/ML Solostar Pen 44u in morning and 75 at night 30 mL 3   lisinopril (ZESTRIL) 2.5 MG tablet Take 1 tablet (2.5 mg total) by mouth daily. 90 tablet 2   metFORMIN (GLUCOPHAGE) 500 MG tablet TAKE 2 TABLETS BY MOUTH TWICE DAILY. 120 tablet 0   pantoprazole  (PROTONIX) 40 MG tablet Take 1 tablet (40 mg total) by mouth daily as needed (for acid reflux). 90 tablet 3   Semaglutide,0.25 or 0.5MG/DOS, (OZEMPIC, 0.25 OR 0.5 MG/DOSE,) 2 MG/1.5ML SOPN Inject 2 mg into the skin once a week. 6 mL 5   tamsulosin (FLOMAX) 0.4 MG CAPS capsule Take 1 capsule (0.4 mg total) by mouth at bedtime. 90 capsule 1   traMADol (ULTRAM) 50 MG tablet Take 1 tablet (50 mg total) by mouth every 6 (six)  hours as needed for severe pain. 30 tablet 0   No current facility-administered medications for this visit.   Allergies:  Patient has no known allergies.   Social History: The patient  reports that he has never smoked. He has never used smokeless tobacco. He reports that he does not drink alcohol and does not use drugs.   Family History: The patient's family history includes Dementia in his father; Diabetes in his father and maternal grandfather; Hyperlipidemia in his father and mother; Hypertension in his father and mother.   ROS:  Please see the history of present illness. Otherwise, complete review of systems is positive for none.  All other systems are reviewed and negative.   Physical Exam: VS:  BP 124/76   Pulse 92   Ht _0  (1.753 m)   Wt 233 lb 9.6 oz (106 kg)   SpO2 97%   BMI 34.50 kg/m , BMI Body mass index is 34.5 kg/m.  Wt Readings from Last 3 Encounters:  11/07/20 233 lb 9.6 oz (106 kg)  11/06/20 236 lb (107 kg)  10/29/20 234 lb 3.2 oz (106.2 kg)    General: Patient appears comfortable at rest. Neck: Supple, no elevated JVP or carotid bruits, no thyromegaly. Lungs: Clear to auscultation, nonlabored breathing at rest. Cardiac: Regular rate and rhythm, no S3 or significant systolic murmur, no pericardial rub. Extremities: No pitting edema, distal pulses 2+. Skin: Warm and dry.  Median sternotomy scar and chest tube healing well.  Right endoscopic vein harvest incision site healing well Musculoskeletal: No kyphosis. Neuropsychiatric: Alert and oriented  x3, affect grossly appropriate.  ECG:  EKG Aug 22, 2020 normal sinus rhythm with a rate of 75, septal infarct, age undetermined, T wave abnormality consider lateral ischemia,  Recent Labwork: 08/22/2020: Magnesium 2.0 11/06/2020: ALT 19; AST 20; BUN 19; Creatinine, Ser 0.97; Hemoglobin 11.7; Platelets 315; Potassium 4.8; Sodium 139     Component Value Date/Time   CHOL 87 (L) 11/06/2020 1145   TRIG 198 (H) 11/06/2020 1145   HDL 19 (L) 11/06/2020 1145   CHOLHDL 4.6 11/06/2020 1145   CHOLHDL 3.7 08/16/2020 0319   VLDL 26 08/16/2020 0319   LDLCALC 36 11/06/2020 1145    Other Studies Reviewed Today:  CABG x4 Aug 22, 2020 Dr. Roxan Hockey Coronary artery bypass grafting x 4      Left internal mammary artery to LAD,      Saphenous vein graft to diagonal,      Sequential saphenous vein graft to obtuse marginal 1 and 3 Endoscopic vein harvest right thigh.    Cardiac catheterization 08/15/2020 Conclusion    Prox RCA lesion is 70% stenosed. LPAV lesion is 100% stenosed. Ost LAD to Prox LAD lesion is 95% stenosed. Mid LAD lesion is 80% stenosed. 2nd Mrg lesion is 90% stenosed. Previously placed 1st LPL stent (unknown type) is widely patent.   [name] [name] is a 65 y.o. male  Diagnostic Dominance: Left   IMPRESSION: [name] [name] has aggressive in-stent restenosis within the ostial/proximal LAD stent and mid LAD stent with obtuse marginal branch disease and an occluded PDA.  His last EF by hand-injection was normal.  He will need coronary artery bypass graft for complete revascularization giving aggressive early in-stent restenosis.  He is on Effient which was discontinued and he will need Effient washout prior to revascularization.  Given the accelerated nature of his symptoms and the degree of severity of his stenosis I favor hospitalization prior to revascularization.  The sheath was removed and a TR  band was placed on the right wrist to achieve patent hemostasis.  The patient left lab in  stable condition.    Echocardiogram 08/16/2020  Left ventricular ejection fraction, by estimation, is 60 to 65%. The left ventricle has normal function. Left ventricular endocardial border not optimally defined to evaluate regional wall motion. Grossly, no wall motion abnormalities seen. There is mild concentric left ventricular hypertrophy. Left ventricular diastolic parameters are consistent with Grade I diastolic dysfunction (impaired relaxation). Elevated left atrial pressure. 2. Right ventricular systolic function is normal. The right ventricular size is normal. Tricuspid regurgitation signal is inadequate for assessing PA pressure. 3. The mitral valve is normal in structure. No evidence of mitral valve regurgitation. 4. The aortic valve is tricuspid. There is mild calcification of the aortic valve. Aortic valve regurgitation is not visualized. Mild aortic valve sclerosis is present, with no evidence of aortic valve stenosis. Comparison(s): No significant change from prior study. Prior echo in 01/2020 demonstrated mild ascending aorta dilation (27m) which was not well visualized on current study. Will need continued monitoring as outpatient    Carotid and upper extremity vascular studies 08/16/2020  Right Carotid: Velocities in the right ICA are consistent with a 1-39% stenosis. Right Upper Extremity: Doppler waveforms remain within normal limits with right radial compression. Doppler waveforms remain within normal limits with right ulnar compression. Left Upper Extremity: Doppler waveforms remain within normal limits with left radial compression. Doppler waveforms decrease 50% with left ulnar compression. Electronically signed by CDeitra MayoMD on 08/16/2020 at 8:17:51 PM. Left Carotid: Velocities in the left ICA are consistent with a 1-39% stenosis. Vertebrals: Bilateral vertebral arteries demonstrate antegrade flow.   Cardiac catheterization 05/22/2020 Conclusion     Prox RCA lesion is 70% stenosed. Dist LAD lesion is 60% stenosed. 1st Mrg lesion is 85% stenosed. 1st LPL-2 lesion is 80% stenosed. Previously placed 1st LPL-1 drug eluting stent is widely patent. LPAV-2 lesion is 100% stenosed. Dist LM to Prox LAD lesion is 90% stenosed, instent restenosis. Treated with 3.25 mm Wolverine and then 4.0 [place] balloon. Dissection noted by OCT past the edge of old stent. A drug-eluting stent was successfully placed using a SYNERGY XD 3.50X12., post dilated to 4.0 mm. Post intervention, there is a 0% residual stenosis. Stent optimized with OCT. Mid LAD-1 lesion is 20% stenosed. Mid LAD-2 lesion is 75% stenosed. In-stent restenosis. Scoring balloon angioplasty was performed using a BALLOON WOLVERINE 3.25X10. Post intervention, there is a 0% residual stenosis. The left ventricular systolic function is normal. LV end diastolic pressure is normal. The left ventricular ejection fraction is 55-65% by visual estimate. There is no aortic valve stenosis. A drug-eluting stent was successfully placed using a SYNERGY XD 3.50X12.   Continue dual antiplatelet therapy along with aggressive secondary prevention.   Diffuse, distal and small vessel disease in the LAD and OM territories.  Aggressive medical therapy.   Diagnostic Dominance: Left    Intervention           Cardiac catheterization 05/22/2020 INTRAVASCULAR IMAGING/OCT  CORONARY STENT INTERVENTION  LEFT HEART CATH AND CORONARY ANGIOGRAPHY      Conclusion     Prox RCA lesion is 70% stenosed. Dist LAD lesion is 60% stenosed. 1st Mrg lesion is 85% stenosed. 1st LPL-2 lesion is 80% stenosed. Previously placed 1st LPL-1 drug eluting stent is widely patent. LPAV-2 lesion is 100% stenosed. Dist LM to Prox LAD lesion is 90% stenosed, instent restenosis. Treated with 3.25 mm Wolverine and then 4.0 [place] balloon. Dissection noted by  OCT past the edge of old stent. A drug-eluting stent was successfully placed  using a SYNERGY XD 3.50X12., post dilated to 4.0 mm. Post intervention, there is a 0% residual stenosis. Stent optimized with OCT. Mid LAD-1 lesion is 20% stenosed. Mid LAD-2 lesion is 75% stenosed. In-stent restenosis. Scoring balloon angioplasty was performed using a BALLOON WOLVERINE 3.25X10. Post intervention, there is a 0% residual stenosis. The left ventricular systolic function is normal. LV end diastolic pressure is normal. The left ventricular ejection fraction is 55-65% by visual estimate. There is no aortic valve stenosis. A drug-eluting stent was successfully placed using a SYNERGY XD 3.50X12.   Continue dual antiplatelet therapy along with aggressive secondary prevention.   Diffuse, distal and small vessel disease in the LAD and OM territories.  Aggressive medical therapy.   Diagnostic Dominance: Left      Intervention            Echocardiogram 02/08/2020:  1. Left ventricular ejection fraction, by estimation, is 60 to 65%. The  left ventricle has normal function. The left ventricle has no regional  wall motion abnormalities. Left ventricular diastolic parameters are  consistent with Grade I diastolic  dysfunction (impaired relaxation).   2. Right ventricular systolic function is normal. The right ventricular  size is normal. Tricuspid regurgitation signal is inadequate for assessing  PA pressure.   3. The mitral valve is normal in structure. No evidence of mitral valve  regurgitation. No evidence of mitral stenosis.   4. The aortic valve is tricuspid. Aortic valve regurgitation is not  visualized. Mild aortic valve sclerosis is present, with no evidence of  aortic valve stenosis.   5. Aortic dilatation noted. There is mild dilatation of the ascending  aorta, measuring 40 mm.   PCI 02/09/2020: Prox RCA lesion is 70% stenosed. Dist LM to Prox LAD lesion is 95% stenosed. Dist LAD lesion is 60% stenosed. Mid LAD lesion is 20% stenosed. Prox LAD to Mid LAD  lesion is 20% stenosed. 1st Mrg lesion is 85% stenosed. 1st LPL-1 lesion is 95% stenosed. 1st LPL-2 lesion is 80% stenosed. LPAV-1 lesion is 30% stenosed. LPAV-2 lesion is 100% stenosed. Post intervention, there is a 0% residual stenosis. A drug-eluting stent was successfully placed using a SYNERGY XD 3.50X12. Post intervention, there is a 0% residual stenosis. A drug-eluting stent was successfully placed using a SYNERGY XD 2.25X16.   1. Successful IVUS guided orbital atherectomy and drug eluting stent placement to the ostial LAD. 2.  Successful angioplasty and drug-eluting stent placement to first PL branch of the left circumflex.    Assessment and Plan:  1. S/P CABG x 4   2. CAD in native artery   3. Mixed hyperlipidemia   4. Essential hypertension, benign   5. Type 2 diabetes mellitus with complication, without long-term current use of insulin ([place])     1. S/P CABG x 4 Recent CABG x4 with (LIMA-LAD, SVG-Diagonal, Sequential SVG to OM1 and OM3).  Median sternotomy site and right endoscopic vein harvesting site healing well.  Patient denies any anginal or exertional symptoms.  He recently saw Dr. Roxan Hockey who has released him from his care.  He has been taking Lasix for a while but states she has a few more weeks left of Lasix.  He states Dr. Roxan Hockey told him to call if weight gain of 3 pounds in 1 day or 5 pounds in 3 days after stopping Lasix.  Continue Lasix as directed by Dr. Roxan Hockey  2. CAD in native  artery Recent cardiac catheterization 08/15/2020 with proximal RCA lesion 70%, LPA VISA lesion 100%, ostial LAD to proximal LAD 95%, mid LAD lesion 80%, second marginal lesion 90%.  Previously placed first LPL stent widely patent.  As a result patient underwent four-vessel bypass as noted above.  Currently denies any anginal symptoms.  Continue aspirin 325 mg daily.  His carvedilol was decreased to 18.75 mg p.o. twice daily by Dr. Roxan Hockey due to some increased dizziness  after increasing dose at prior visit.  Continue carvedilol 18.75 mg p.o. twice daily.  3. Mixed hyperlipidemia Continue atorvastatin 80 mg p.o. daily.  Recent lipid panel 11/06/2020: Total cholesterol 87, HDL 19, triglycerides 198, LDL 36.  4. Essential hypertension, benign Blood pressure today is 124/76 continue lisinopril 2.5 mg daily.  Carvedilol 18.75 mg p.o. twice daily  5. Type 2 diabetes mellitus with complication, without long-term current use of insulin ([facility]) Patient states his glucose is reasonably well controlled recently since discharge.  He continues on Lantus, Ozempic, aspartame insulin.  Medication Adjustments/Labs and Tests Ordered: Current medicines are reviewed at length with the patient today.  Concerns regarding medicines are outlined above.   Disposition: Follow-up with Dr. Domenic Polite at scheduled visit on September 21 per patient request  Signed, Levell July, NP 11/07/2020 8:26 AM    [facility] [place], [place], [place] [address] Phone: [phone]; Fax: [phone]

## 2020-11-07 ENCOUNTER — Ambulatory Visit: Payer: Medicare PPO | Admitting: Family Medicine

## 2020-11-07 ENCOUNTER — Other Ambulatory Visit: Payer: Self-pay | Admitting: Nurse Practitioner

## 2020-11-07 ENCOUNTER — Encounter: Payer: Self-pay | Admitting: Family Medicine

## 2020-11-07 VITALS — BP 124/76 | HR 92 | Ht 69.0 in | Wt 233.6 lb

## 2020-11-07 DIAGNOSIS — I10 Essential (primary) hypertension: Secondary | ICD-10-CM

## 2020-11-07 DIAGNOSIS — E1159 Type 2 diabetes mellitus with other circulatory complications: Secondary | ICD-10-CM

## 2020-11-07 DIAGNOSIS — I2510 Atherosclerotic heart disease of native coronary artery without angina pectoris: Secondary | ICD-10-CM | POA: Diagnosis not present

## 2020-11-07 DIAGNOSIS — E118 Type 2 diabetes mellitus with unspecified complications: Secondary | ICD-10-CM | POA: Diagnosis not present

## 2020-11-07 DIAGNOSIS — E782 Mixed hyperlipidemia: Secondary | ICD-10-CM

## 2020-11-07 DIAGNOSIS — Z951 Presence of aortocoronary bypass graft: Secondary | ICD-10-CM | POA: Diagnosis not present

## 2020-11-07 LAB — CBC WITH DIFFERENTIAL/PLATELET
Basophils Absolute: 0 10*3/uL (ref 0.0–0.2)
Basos: 1 %
EOS (ABSOLUTE): 0.2 10*3/uL (ref 0.0–0.4)
Eos: 3 %
Hematocrit: 37.2 % — ABNORMAL LOW (ref 37.5–51.0)
Hemoglobin: 11.7 g/dL — ABNORMAL LOW (ref 13.0–17.7)
Immature Grans (Abs): 0 10*3/uL (ref 0.0–0.1)
Immature Granulocytes: 0 %
Lymphocytes Absolute: 1.9 10*3/uL (ref 0.7–3.1)
Lymphs: 31 %
MCH: 24.7 pg — ABNORMAL LOW (ref 26.6–33.0)
MCHC: 31.5 g/dL (ref 31.5–35.7)
MCV: 79 fL (ref 79–97)
Monocytes Absolute: 0.7 10*3/uL (ref 0.1–0.9)
Monocytes: 12 %
Neutrophils Absolute: 3.3 10*3/uL (ref 1.4–7.0)
Neutrophils: 53 %
Platelets: 315 10*3/uL (ref 150–450)
RBC: 4.74 x10E6/uL (ref 4.14–5.80)
RDW: 15.7 % — ABNORMAL HIGH (ref 11.6–15.4)
WBC: 6.2 10*3/uL (ref 3.4–10.8)

## 2020-11-07 LAB — CMP14+EGFR
ALT: 19 IU/L (ref 0–44)
AST: 20 IU/L (ref 0–40)
Albumin/Globulin Ratio: 1.8 (ref 1.2–2.2)
Albumin: 4.2 g/dL (ref 3.8–4.8)
Alkaline Phosphatase: 75 IU/L (ref 44–121)
BUN/Creatinine Ratio: 20 (ref 10–24)
BUN: 19 mg/dL (ref 8–27)
Bilirubin Total: 0.5 mg/dL (ref 0.0–1.2)
CO2: 24 mmol/L (ref 20–29)
Calcium: 9.5 mg/dL (ref 8.6–10.2)
Chloride: 99 mmol/L (ref 96–106)
Creatinine, Ser: 0.97 mg/dL (ref 0.76–1.27)
Globulin, Total: 2.4 g/dL (ref 1.5–4.5)
Glucose: 118 mg/dL — ABNORMAL HIGH (ref 65–99)
Potassium: 4.8 mmol/L (ref 3.5–5.2)
Sodium: 139 mmol/L (ref 134–144)
Total Protein: 6.6 g/dL (ref 6.0–8.5)
eGFR: 86 mL/min/{1.73_m2} (ref >59)

## 2020-11-07 LAB — LIPID PANEL
Chol/HDL Ratio: 4.6 ratio (ref 0.0–5.0)
Cholesterol, Total: 87 mg/dL — ABNORMAL LOW (ref 100–199)
HDL: 19 mg/dL — ABNORMAL LOW (ref >39)
LDL Chol Calc (NIH): 36 mg/dL (ref 0–99)
Triglycerides: 198 mg/dL — ABNORMAL HIGH (ref 0–149)
VLDL Cholesterol Cal: 32 mg/dL (ref 5–40)

## 2020-11-07 MED ORDER — OZEMPIC (0.25 OR 0.5 MG/DOSE) 2 MG/1.5ML ~~LOC~~ SOPN: 0.5000 mg | PEN_INJECTOR | SUBCUTANEOUS | 5 refills | Status: DC

## 2020-11-07 NOTE — Patient Instructions (Signed)
Medication Instructions:  Your physician recommends that you continue on your current medications as directed. Please refer to the Current Medication list given to you today.  Labwork: none  Testing/Procedures: none  Follow-Up: Your physician recommends that you schedule a follow-up appointment in: as planned  Any Other Special Instructions Will Be Listed Below (If Applicable).  If you need a refill on your cardiac medications before your next appointment, please call your pharmacy.

## 2020-11-25 ENCOUNTER — Other Ambulatory Visit: Payer: Self-pay | Admitting: Nurse Practitioner

## 2020-11-25 ENCOUNTER — Telehealth: Payer: Self-pay

## 2020-11-25 DIAGNOSIS — E1159 Type 2 diabetes mellitus with other circulatory complications: Secondary | ICD-10-CM

## 2020-11-25 DIAGNOSIS — Z794 Long term (current) use of insulin: Secondary | ICD-10-CM

## 2020-11-25 NOTE — Telephone Encounter (Signed)
Pt called and stated that he was having slight chest pains last night. Pt verbalized that he had a CABG. Pt called EMS who checked him out and told him everything looked okay but that he might need to reach out to Cardiology and have his Troponin level checked. Please advise.

## 2020-11-25 NOTE — Telephone Encounter (Signed)
Noted.  He is status post recent CABG, was seen a few weeks ago for routine cardiology follow-up, I reviewed the note.  I see that he is currently on aspirin, Lipitor, lisinopril, and Coreg.  Can probably manage as an outpatient as long as symptoms or not escalating (if they are would be better to be seen in the ER and get lab work including cardiac enzymes).  If he is having only occasional mild symptoms, suggest that we start by getting an ECG and vital signs at nurse visit and then make sure that he has a clinical follow-up with me scheduled -S. [place]   Pt has Nurse appt 9/1 with f/u appt for Dr. Domenic Polite on 9/21

## 2020-11-27 ENCOUNTER — Ambulatory Visit (INDEPENDENT_AMBULATORY_CARE_PROVIDER_SITE_OTHER): Payer: Medicare PPO

## 2020-11-27 VITALS — BP 132/76 | HR 76 | Wt 238.0 lb

## 2020-11-27 DIAGNOSIS — R0789 Other chest pain: Secondary | ICD-10-CM

## 2020-11-27 DIAGNOSIS — Z8709 Personal history of other diseases of the respiratory system: Secondary | ICD-10-CM

## 2020-11-27 MED ORDER — ISOSORBIDE MONONITRATE ER 30 MG PO TB24: 15.0000 mg | ORAL_TABLET | Freq: Every evening | ORAL | 3 refills | Status: DC

## 2020-11-27 NOTE — Addendum Note (Signed)
Addended by: Barbarann Ehlers A on: 11/27/2020 04:18 PM   Modules accepted: Orders

## 2020-11-27 NOTE — Progress Notes (Signed)
[name] [name] is s/p CABG x 4 done 07/2020. He was doing well until 3 days ago when he experienced CP which began in his back,radiated to front of chest and left arm. He says he was watching TV when this occurred.It last about an hour. He did have some diaphoresis at that time. He did not take NTG because he states after CABG he was never told to use it. He just stopped lasix last week at the advice of his surgeon. He now weighs himself everyday.    I will forward note and EKG to Dr.McDowell for dispo.

## 2020-11-27 NOTE — Patient Instructions (Signed)
Continue all medications.   I will call you for further instructions or medication changes after Dr.McDowell has reviewed your chart.

## 2020-11-27 NOTE — Progress Notes (Signed)
[name] Sark, MD  Bernita Raisin, RN Thank you for the update.  I reviewed his ECG which does not show any acute ST segment changes.  I see that he did have a left-sided pleural effusion by chest x-ray earlier in August.  Now that he has stopped Lasix, let's get a follow-up PA and lateral chest x-ray.  We could start Imdur 15 mg once in the evening as well for now.  If symptoms settle down, please arrange follow-up with me in the next few weeks.        I spoke with patient and he states he will get CXR tomorrow. I e-scribed to [place], Imdur 15 mg every evening, #45, RF:3. He will call me back if symptoms return.

## 2020-11-28 ENCOUNTER — Other Ambulatory Visit: Payer: Self-pay

## 2020-11-28 ENCOUNTER — Ambulatory Visit (HOSPITAL_COMMUNITY)
Admission: RE | Admit: 2020-11-28 | Discharge: 2020-11-28 | Disposition: A | Discharge status: Home / Self Care | Payer: Medicare PPO | Source: Ambulatory Visit | Attending: Cardiology | Admitting: Cardiology

## 2020-11-28 ENCOUNTER — Ambulatory Visit: Payer: Medicare PPO

## 2020-11-28 DIAGNOSIS — R0789 Other chest pain: Secondary | ICD-10-CM | POA: Insufficient documentation

## 2020-11-28 DIAGNOSIS — Z8709 Personal history of other diseases of the respiratory system: Secondary | ICD-10-CM | POA: Diagnosis not present

## 2020-11-28 DIAGNOSIS — J90 Pleural effusion, not elsewhere classified: Secondary | ICD-10-CM | POA: Diagnosis not present

## 2020-11-28 DIAGNOSIS — J9811 Atelectasis: Secondary | ICD-10-CM | POA: Diagnosis not present

## 2020-12-05 ENCOUNTER — Other Ambulatory Visit: Payer: Medicare PPO

## 2020-12-05 ENCOUNTER — Other Ambulatory Visit: Payer: Self-pay | Admitting: Nurse Practitioner

## 2020-12-05 DIAGNOSIS — N390 Urinary tract infection, site not specified: Secondary | ICD-10-CM

## 2020-12-05 DIAGNOSIS — R300 Dysuria: Secondary | ICD-10-CM | POA: Diagnosis not present

## 2020-12-05 LAB — URINALYSIS, COMPLETE
Bilirubin, UA: NEGATIVE
Glucose, UA: NEGATIVE
Nitrite, UA: POSITIVE — AB
Specific Gravity, UA: 1.02 (ref 1.005–1.030)
Urobilinogen, Ur: 0.2 mg/dL (ref 0.2–1.0)
pH, UA: 6.5 (ref 5.0–7.5)

## 2020-12-05 LAB — MICROSCOPIC EXAMINATION
Renal Epithel, UA: NONE SEEN /hpf
WBC, UA: >30 /hpf — AB (ref 0–5)

## 2020-12-05 MED ORDER — DOXYCYCLINE HYCLATE 100 MG PO TABS
100.0000 mg | ORAL_TABLET | Freq: Two times a day (BID) | ORAL | 0 refills | Status: DC
Start: 2020-12-05 — End: 2020-12-18

## 2020-12-05 NOTE — Progress Notes (Signed)
[facility] ordered this encounter  Medications   doxycycline (VIBRA-TABS) 100 MG tablet    Sig: Take 1 tablet (100 mg total) by mouth 2 (two) times daily. 1 po bid    Dispense:  28 tablet    Refill:  0    Order Specific Question:   Supervising Provider    Answer:   Caryl Pina A [id number]

## 2020-12-09 LAB — URINE CULTURE

## 2020-12-09 MED ORDER — SULFAMETHOXAZOLE-TRIMETHOPRIM 800-160 MG PO TABS: 1.0000 | ORAL_TABLET | Freq: Two times a day (BID) | ORAL | 0 refills | Status: DC

## 2020-12-17 ENCOUNTER — Encounter: Payer: Self-pay | Admitting: Cardiology

## 2020-12-17 NOTE — Progress Notes (Signed)
Cardiology Office Note  Date: 12/18/2020   ID: [name] [name], [name] [name] 1955/05/22, MRN [id number]  PCP:  Chevis Pretty, FNP  Cardiologist:  Rozann Lesches, MD Electrophysiologist:  None   Chief Complaint  Patient presents with   Cardiac follow-up     History of Present Illness: LEMOYNE [name] is a 65 y.o. male seen recently in August by Mr. Leonides Sake NP.  He is most recently status post CABG in May of this year with Dr. Roxan Hockey including LIMA to LAD, SVG to diagonal, and SVG to OM1 and OM 3.  He did have a postoperative left pleural effusion managed with Lasix, but recently has had some chest discomfort as well.  Follow-up chest x-ray in early September showed small left pleural effusion with minimal left basilar atelectasis.  He is here today for follow-up, states that he feels better.  The thoracic pain he has been experiencing is more of a sharp "hot poker" sensation from his back to his chest, worse when he sits too much, better when he gets up and moves around.  Not suggestive of angina based on description.  I reviewed his medications which are noted below.  He is tolerating current regimen well.  Higher dose Coreg caused orthostatic lightheadedness.  Lipid panel in August revealed LDL 36.  I talked with him about getting back into cardiac rehab.  Past Medical History:  Diagnosis Date   Anxiety    Coronary atherosclerosis of native coronary artery    DES distal circumflex 2005; DES LAD/diagonal bifurcation 09/2010; DES ostial LAD and DES left PL 01/2020; CABG May 2022 (LIMA to LAD, SVG to diagonal, SVG to OM1 and OM 3)   Essential hypertension    GERD (gastroesophageal reflux disease)    History of kidney stones    Mixed hyperlipidemia    Myocardial infarction ([place])    Anterolateral with VF arrest 7/12   Type 2 diabetes mellitus ([place])     Past Surgical History:  Procedure Laterality Date   CARDIAC CATHETERIZATION  2012   CAROTID STENT     stents x 2     CHOLECYSTECTOMY N/A 12/04/2015   Procedure: LAPAROSCOPIC CHOLECYSTECTOMY;  Surgeon: Aviva Signs, MD;  Location: [facility];  Service: General;  Laterality: N/A;   CHOLECYSTECTOMY, LAPAROSCOPIC  12/05/2015   CORONARY ANGIOPLASTY  2012   STENT X 1 2012, STENT X 1 YRS BEFORE   CORONARY ARTERY BYPASS GRAFT N/A 08/21/2020   Procedure: CORONARY ARTERY BYPASS GRAFTING (CABG) X 4, USING LEFT INTERNAL MAMMARY ARTERY AND RIGHT GREATER SAPHENOUS VEIN HARVESTED ENDOSCOPICALLY. SVG TO OM1, OM3 SEQUENTIALLY, SVG TO DIAG., LIMA TO LAD;  Surgeon: Melrose Nakayama, MD;  Location: [facility];  Service: Open Heart Surgery;  Laterality: N/A;   CORONARY ATHERECTOMY N/A 02/09/2020   Procedure: CORONARY ATHERECTOMY;  Surgeon: Wellington Hampshire, MD;  Location: [facility];  Service: Cardiovascular;  Laterality: N/A;   CORONARY STENT INTERVENTION N/A 02/09/2020   Procedure: CORONARY STENT INTERVENTION;  Surgeon: Wellington Hampshire, MD;  Location: [facility];  Service: Cardiovascular;  Laterality: N/A;   CORONARY STENT INTERVENTION N/A 05/22/2020   Procedure: CORONARY STENT INTERVENTION;  Surgeon: Jettie Booze, MD;  Location: [facility];  Service: Cardiovascular;  Laterality: N/A;   ENDOVEIN HARVEST OF GREATER SAPHENOUS VEIN Right 08/21/2020   Procedure: ENDOVEIN HARVEST OF GREATER SAPHENOUS VEIN;  Surgeon: Melrose Nakayama, MD;  Location: [facility];  Service: Open Heart Surgery;  Laterality: Right;   HYDROCELE  EXCISION Bilateral 06/02/2017   Procedure: HYDROCELECTOMY ADULT;  Surgeon: Ceasar Mons, MD;  Location: [facility];  Service: Urology;  Laterality: Bilateral;  ONLY NEEDS 45 MIN   INGUINAL HERNIA REPAIR     RIGHT GROIN   INTRAVASCULAR IMAGING/OCT N/A 05/22/2020   Procedure: INTRAVASCULAR IMAGING/OCT;  Surgeon: Jettie Booze, MD;  Location: [facility];  Service: Cardiovascular;  Laterality: N/A;   INTRAVASCULAR ULTRASOUND/IVUS N/A 02/09/2020   Procedure: Intravascular  Ultrasound/IVUS;  Surgeon: Wellington Hampshire, MD;  Location: [facility];  Service: Cardiovascular;  Laterality: N/A;   KNEE ARTHROSCOPY Left 05/23/2019   Procedure: LEFT KNEE ARTHROSCOPY AND DEBRIDEMENT PARTIAL MEDIAL MENISECTOMY;  Surgeon: Newt Minion, MD;  Location: [facility];  Service: Orthopedics;  Laterality: Left;   LEFT HEART CATH AND CORONARY ANGIOGRAPHY N/A 02/07/2020   Procedure: LEFT HEART CATH AND CORONARY ANGIOGRAPHY;  Surgeon: Leonie Man, MD;  Location: [facility];  Service: Cardiovascular;  Laterality: N/A;   LEFT HEART CATH AND CORONARY ANGIOGRAPHY N/A 05/22/2020   Procedure: LEFT HEART CATH AND CORONARY ANGIOGRAPHY;  Surgeon: Jettie Booze, MD;  Location: [facility];  Service: Cardiovascular;  Laterality: N/A;   LEFT HEART CATH AND CORONARY ANGIOGRAPHY N/A 08/15/2020   Procedure: LEFT HEART CATH AND CORONARY ANGIOGRAPHY;  Surgeon: Lorretta Harp, MD;  Location: [facility];  Service: Cardiovascular;  Laterality: N/A;   TEE WITHOUT CARDIOVERSION N/A 08/21/2020   Procedure: TRANSESOPHAGEAL ECHOCARDIOGRAM (TEE);  Surgeon: Melrose Nakayama, MD;  Location: [facility];  Service: Open Heart Surgery;  Laterality: N/A;   TRIGGER FINGER RELEASE Right 01/08/2015   Procedure: RELEASE TRIGGER FINGER/A-1 PULLEY RIGHT MIDDLE FINGER;  Surgeon: Daryll Brod, MD;  Location: [facility];  Service: Orthopedics;  Laterality: Right;    Current Outpatient Medications  Medication Sig Dispense Refill   Accu-Chek Softclix Lancets lancets USE TO CHECK BLOOD SUGAR 4 TIMES DAILY. Dx E11.8 100 each PRN   aspirin EC 325 MG EC tablet Take 1 tablet (325 mg total) by mouth daily. 30 tablet 0   atorvastatin (LIPITOR) 80 MG tablet Take 1 tablet (80 mg total) by mouth daily. 90 tablet 3   blood glucose meter kit and supplies Dispense based on patient and insurance preference. Use up to four times daily as directed. (FOR ICD-10 E10.9, E11.9). 1  each 0   carvedilol (COREG) 12.5 MG tablet Take 1.5 tablets (18.75 mg total) by mouth 2 (two) times daily with a meal. 60 tablet 3   cetirizine (ZYRTEC) 10 MG tablet Take 10 mg by mouth daily.     Cinnamon 500 MG TABS Take 1,000 mg by mouth 2 (two) times daily.      CRANBERRY PO Take 4,200 mg by mouth 2 (two) times daily.      furosemide (LASIX) 20 MG tablet Take 20 mg daily as needed for leg swelling 90 tablet 3   GLOBAL EASE INJECT PEN NEEDLES 31G X 8 MM MISC Use up to 8 times a day Dx E11.8 800 each 3   glucose blood (ACCU-CHEK AVIVA PLUS) test strip Check BS up to 4 times a day dx E11.8 100 strip 11   Insulin Aspart FlexPen 100 UNIT/ML SOPN INJECT UP TO 45 UNITS EVERY DAY (Patient taking differently: Inject 45-75 Units into the skin 2 (two) times daily with a meal. INJECT 45-75 units subq with breakfast and supper. Sliding scale) 15 mL 11   insulin glargine (LANTUS SOLOSTAR) 100  UNIT/ML Solostar Pen 44u in morning and 75 at night 30 mL 3   isosorbide mononitrate (IMDUR) 30 MG 24 hr tablet Take 0.5 tablets (15 mg total) by mouth every evening. 45 tablet 3   lisinopril (ZESTRIL) 2.5 MG tablet Take 1 tablet (2.5 mg total) by mouth daily. 90 tablet 2   metFORMIN (GLUCOPHAGE) 500 MG tablet TAKE 2 TABLETS BY MOUTH TWICE DAILY. 120 tablet 0   pantoprazole (PROTONIX) 40 MG tablet Take 1 tablet (40 mg total) by mouth daily as needed (for acid reflux). 90 tablet 3   Semaglutide,0.25 or 0.5MG/DOS, (OZEMPIC, 0.25 OR 0.5 MG/DOSE,) 2 MG/1.5ML SOPN Inject 0.5 mg into the skin once a week. 6 mL 5   sulfamethoxazole-trimethoprim (BACTRIM DS) 800-160 MG tablet Take 1 tablet by mouth 2 (two) times daily. 20 tablet 0   tamsulosin (FLOMAX) 0.4 MG CAPS capsule Take 1 capsule (0.4 mg total) by mouth at bedtime. 90 capsule 1   traMADol (ULTRAM) 50 MG tablet Take 1 tablet (50 mg total) by mouth every 6 (six) hours as needed for severe pain. 30 tablet 0   No current facility-administered medications for this visit.    Allergies:  Patient has no known allergies.   ROS: No orthopnea or PND.  Insomnia.  Physical Exam: VS:  BP 138/76   Pulse 85   Ht _0  (1.753 m)   Wt 234 lb 9.6 oz (106.4 kg)   SpO2 95%   BMI 34.64 kg/m , BMI Body mass index is 34.64 kg/m.  Wt Readings from Last 3 Encounters:  12/18/20 234 lb 9.6 oz (106.4 kg)  11/27/20 238 lb (108 kg)  11/07/20 233 lb 9.6 oz (106 kg)    General: Patient appears comfortable at rest. HEENT: Conjunctiva and lids normal, wearing a mask. Neck: Supple, no elevated JVP or carotid bruits, no thyromegaly. Lungs: Clear to auscultation, nonlabored breathing at rest. Thorax: Well-healed sternal incision. Cardiac: Regular rate and rhythm, no S3 or significant systolic murmur. Abdomen: Soft, nontender, bowel sounds present. Extremities: Trace ankle edema.  ECG:  An ECG dated 11/27/2020 was personally reviewed today and demonstrated:  Sinus rhythm with prolonged PR interval, poor R wave progression, nonspecific ST-T changes.  Recent Labwork: 08/22/2020: Magnesium 2.0 11/06/2020: ALT 19; AST 20; BUN 19; Creatinine, Ser 0.97; Hemoglobin 11.7; Platelets 315; Potassium 4.8; Sodium 139     Component Value Date/Time   CHOL 87 (L) 11/06/2020 1145   TRIG 198 (H) 11/06/2020 1145   HDL 19 (L) 11/06/2020 1145   CHOLHDL 4.6 11/06/2020 1145   CHOLHDL 3.7 08/16/2020 0319   VLDL 26 08/16/2020 0319   LDLCALC 36 11/06/2020 1145    Other Studies Reviewed Today:  Echocardiogram 08/16/2020:  1. Left ventricular ejection fraction, by estimation, is 60 to 65%. The  left ventricle has normal function. Left ventricular endocardial border  not optimally defined to evaluate regional wall motion. Grossly, no wall  motion abnormalities seen. There is  mild concentric left ventricular hypertrophy. Left ventricular diastolic  parameters are consistent with Grade I diastolic dysfunction (impaired  relaxation). Elevated left atrial pressure.   2. Right ventricular systolic  function is normal. The right ventricular  size is normal. Tricuspid regurgitation signal is inadequate for assessing  PA pressure.   3. The mitral valve is normal in structure. No evidence of mitral valve  regurgitation.   4. The aortic valve is tricuspid. There is mild calcification of the  aortic valve. Aortic valve regurgitation is not visualized. Mild aortic  valve  sclerosis is present, with no evidence of aortic valve stenosis.  Chest x-ray 11/28/2020: FINDINGS: Stable cardiomediastinal silhouette. Status post coronary bypass graft. No pneumothorax is noted. Right lung is clear. Small left pleural effusion is noted. Minimal left basilar subsegmental atelectasis is noted. Bony thorax is unremarkable.   IMPRESSION: Small left pleural effusion. Minimal left basilar subsegmental atelectasis.  Assessment and Plan:  1.  Multivessel CAD status post CABG in May as outlined above.  LVEF 60 to 65%.  He did have a postoperative left pleural effusion that has improved, small by follow-up chest x-ray earlier in the month.  Lasix will now be used on an as-needed basis.  Continue aspirin (reduce dose from 325 mg to 81 mg in December), Coreg, Imdur, lisinopril, Lipitor, and as needed nitroglycerin.  Referral back to cardiac rehabilitation.  2.  Mixed hyperlipidemia, tolerating high-dose Lipitor.  Recent LDL 36.  3.  Type 2 diabetes mellitus, currently on Lantus, Glucophage, and Ozempic.  Discussed regular exercise and diet.  Most recent hemoglobin A1c down to 6.9.  Medication Adjustments/Labs and Tests Ordered: Current medicines are reviewed at length with the patient today.  Concerns regarding medicines are outlined above.   Tests Ordered: Orders Placed This Encounter  Procedures   AMB referral to cardiac rehabilitation     Medication Changes: Meds ordered this encounter  Medications   furosemide (LASIX) 20 MG tablet    Sig: Take 20 mg daily as needed for leg swelling    Dispense:  90  tablet    Refill:  3     Disposition:  Follow up  3 months.  Signed, Satira Sark, MD, [facility] 12/18/2020 8:45 AM    [facility] [place], [place],  [address] Phone: [phone]; Fax: [phone]

## 2020-12-18 ENCOUNTER — Ambulatory Visit: Payer: Medicare PPO | Admitting: Cardiology

## 2020-12-18 ENCOUNTER — Encounter: Payer: Self-pay | Admitting: Cardiology

## 2020-12-18 VITALS — BP 138/76 | HR 85 | Ht 69.0 in | Wt 234.6 lb

## 2020-12-18 DIAGNOSIS — I25119 Atherosclerotic heart disease of native coronary artery with unspecified angina pectoris: Secondary | ICD-10-CM

## 2020-12-18 DIAGNOSIS — E1165 Type 2 diabetes mellitus with hyperglycemia: Secondary | ICD-10-CM | POA: Diagnosis not present

## 2020-12-18 DIAGNOSIS — E782 Mixed hyperlipidemia: Secondary | ICD-10-CM

## 2020-12-18 MED ORDER — FUROSEMIDE 20 MG PO TABS: ORAL_TABLET | ORAL | 3 refills | Status: DC

## 2020-12-18 NOTE — Patient Instructions (Signed)
Medication Instructions:   At the end of December , reduce aspirin to 81 mg daily   Take Lasix 20 mg daily as needed for leg swelling.  *If you need a refill on your cardiac medications before your next appointment, please call your pharmacy*   Lab Work: None today  If you have labs (blood work) drawn today and your tests are completely normal, you will receive your results only by: [place] (if you have MyChart) OR A paper copy in the mail If you have any lab test that is abnormal or we need to change your treatment, we will call you to review the results.   Testing/Procedures: None today    Follow-Up: At [facility], you and your health needs are our priority.  As part of our continuing mission to provide you with exceptional heart care, we have created designated Provider Care Teams.  These Care Teams include your primary Cardiologist (physician) and Advanced Practice Providers (APPs -  Physician Assistants and Nurse Practitioners) who all work together to provide you with the care you need, when you need it.  We recommend signing up for the patient portal called "MyChart".  Sign up information is provided on this After Visit Summary.  MyChart is used to connect with patients for Virtual Visits (Telemedicine).  Patients are able to view lab/test results, encounter notes, upcoming appointments, etc.  Non-urgent messages can be sent to your provider as well.   To learn more about what you can do with MyChart, go to [email or web address].    Your next appointment:   3 month(s)  The format for your next appointment:   In Person  Provider:   Rozann Lesches, MD   Other Instructions You have been referred to Cardiac rehab

## 2021-01-03 ENCOUNTER — Other Ambulatory Visit: Payer: Self-pay | Admitting: Nurse Practitioner

## 2021-01-16 ENCOUNTER — Encounter (HOSPITAL_COMMUNITY): Payer: Self-pay

## 2021-01-16 ENCOUNTER — Encounter (HOSPITAL_COMMUNITY)
Admission: RE | Admit: 2021-01-16 | Discharge: 2021-01-16 | Disposition: A | Discharge status: Home / Self Care | Payer: Medicare PPO | Source: Ambulatory Visit | Attending: Cardiology | Admitting: Cardiology

## 2021-01-16 ENCOUNTER — Other Ambulatory Visit: Payer: Self-pay

## 2021-01-16 VITALS — BP 128/62 | HR 83 | Ht 69.0 in | Wt 240.5 lb

## 2021-01-16 DIAGNOSIS — I214 Non-ST elevation (NSTEMI) myocardial infarction: Secondary | ICD-10-CM | POA: Diagnosis not present

## 2021-01-16 DIAGNOSIS — Z951 Presence of aortocoronary bypass graft: Secondary | ICD-10-CM | POA: Insufficient documentation

## 2021-01-16 NOTE — Progress Notes (Signed)
Cardiac Individual Treatment Plan  Patient Details  Name: [name] [name] MRN: [id number] Date of Birth: 1955-10-28 Referring Provider:   Flowsheet Row CARDIAC REHAB PHASE II ORIENTATION from 01/16/2021 in [place]  Referring Provider Dr. Domenic Polite       Initial Encounter Date:  Flowsheet Row CARDIAC REHAB PHASE II ORIENTATION from 01/16/2021 in [place]  Date 01/16/21       Visit Diagnosis: S/P CABG x 4  NSTEMI (non-ST elevated myocardial infarction) ([place])  Patient's Home Medications on Admission:  Current Outpatient Medications:    aspirin EC 325 MG EC tablet, Take 1 tablet (325 mg total) by mouth daily., Disp: 30 tablet, Rfl: 0   atorvastatin (LIPITOR) 80 MG tablet, Take 1 tablet (80 mg total) by mouth daily., Disp: 90 tablet, Rfl: 3   carvedilol (COREG) 12.5 MG tablet, Take 1.5 tablets (18.75 mg total) by mouth 2 (two) times daily with a meal., Disp: 60 tablet, Rfl: 3   cetirizine (ZYRTEC) 10 MG tablet, Take 10 mg by mouth daily., Disp: , Rfl:    CINNAMON PO, Take 1 tablet by mouth daily., Disp: , Rfl:    CRANBERRY PO, Take 1 tablet by mouth 2 (two) times daily., Disp: , Rfl:    furosemide (LASIX) 20 MG tablet, Take 20 mg daily as needed for leg swelling, Disp: 90 tablet, Rfl: 3   Insulin Aspart FlexPen (NOVOLOG) 100 UNIT/ML, INJECT 45-75 UNITS AT BREAKFAST AND DINNER. (Patient taking differently: Inject 0-45 Units into the skin 3 (three) times daily before meals. Dose per sliding scale), Disp: 15 mL, Rfl: 0   insulin glargine (LANTUS SOLOSTAR) 100 UNIT/ML Solostar Pen, 44u in morning and 75 at night (Patient taking differently: Inject 44-75 Units into the skin See admin instructions. Inject 44 units in morning and 75 units at night.), Disp: 30 mL, Rfl: 3   isosorbide mononitrate (IMDUR) 30 MG 24 hr tablet, Take 0.5 tablets (15 mg total) by mouth every evening., Disp: 45 tablet, Rfl: 3   lisinopril (ZESTRIL) 2.5 MG tablet, Take 1  tablet (2.5 mg total) by mouth daily., Disp: 90 tablet, Rfl: 2   metFORMIN (GLUCOPHAGE) 500 MG tablet, TAKE 2 TABLETS BY MOUTH TWICE DAILY., Disp: 120 tablet, Rfl: 0   pantoprazole (PROTONIX) 40 MG tablet, Take 1 tablet (40 mg total) by mouth daily as needed (for acid reflux)., Disp: 90 tablet, Rfl: 3   Semaglutide,0.25 or 0.5MG/DOS, (OZEMPIC, 0.25 OR 0.5 MG/DOSE,) 2 MG/1.5ML SOPN, Inject 0.5 mg into the skin once a week., Disp: 6 mL, Rfl: 5   tamsulosin (FLOMAX) 0.4 MG CAPS capsule, Take 1 capsule (0.4 mg total) by mouth at bedtime., Disp: 90 capsule, Rfl: 1   traMADol (ULTRAM) 50 MG tablet, Take 1 tablet (50 mg total) by mouth every 6 (six) hours as needed for severe pain., Disp: 30 tablet, Rfl: 0   Accu-Chek Softclix Lancets lancets, USE TO CHECK BLOOD SUGAR 4 TIMES DAILY. Dx E11.8, Disp: 100 each, Rfl: PRN   blood glucose meter kit and supplies, Dispense based on patient and insurance preference. Use up to four times daily as directed. (FOR ICD-10 E10.9, E11.9)., Disp: 1 each, Rfl: 0   GLOBAL EASE INJECT PEN NEEDLES 31G X 8 MM MISC, Use up to 8 times a day Dx E11.8, Disp: 800 each, Rfl: 3   glucose blood (ACCU-CHEK AVIVA PLUS) test strip, Check BS up to 4 times a day dx E11.8, Disp: 100 strip, Rfl: 11   sulfamethoxazole-trimethoprim (BACTRIM DS) 800-160  MG tablet, Take 1 tablet by mouth 2 (two) times daily. (Patient not taking: Reported on 01/13/2021), Disp: 20 tablet, Rfl: 0  Past Medical History: Past Medical History:  Diagnosis Date   Anxiety    Coronary atherosclerosis of native coronary artery    DES distal circumflex 2005; DES LAD/diagonal bifurcation 09/2010; DES ostial LAD and DES left PL 01/2020; CABG May 2022 (LIMA to LAD, SVG to diagonal, SVG to OM1 and OM 3)   Essential hypertension    GERD (gastroesophageal reflux disease)    History of kidney stones    Mixed hyperlipidemia    Myocardial infarction ([place])    Anterolateral with VF arrest 7/12   Type 2 diabetes mellitus ([facility])      Tobacco Use: Social History   Tobacco Use  Smoking Status Never  Smokeless Tobacco Never    Labs: Recent Review Flowsheet Data     Labs for ITP Cardiac and Pulmonary Rehab Latest Ref Rng & Units 08/21/2020 08/21/2020 08/21/2020 08/21/2020 11/06/2020   Cholestrol 100 - 199 mg/dL - - - - 87(L)   LDLCALC 0 - 99 mg/dL - - - - 36   HDL >39 mg/dL - - - - 19(L)   Trlycerides 0 - 149 mg/dL - - - - 198(H)   Hemoglobin A1c <7.0 % - - - - 6.9   PHART 7.350 - 7.450 7.281(L) 7.335(L) 7.335(L) 7.364 -   PCO2ART 32.0 - 48.0 mmHg 50.5(H) 43.6 43.0 40.2 -   HCO3 20.0 - 28.0 mmol/L 23.8 23.2 22.9 22.8 -   TCO2 22 - 32 mmol/L '25 25 24 24 ' -   ACIDBASEDEF 0.0 - 2.0 mmol/L 3.0(H) 3.0(H) 3.0(H) 2.0 -   O2SAT % 95.0 98.0 97.0 97.0 -       Capillary Blood Glucose: Lab Results  Component Value Date   GLUCAP 136 (H) 08/26/2020   GLUCAP 209 (H) 08/25/2020   GLUCAP 221 (H) 08/25/2020   GLUCAP 203 (H) 08/25/2020   GLUCAP 86 08/25/2020    POCT Glucose     Row Name 01/16/21 0830             POCT Blood Glucose   Pre-Exercise 131 mg/dL                Exercise Target Goals: Exercise Program Goal: Individual exercise prescription set using results from initial 6 min walk test and THRR while considering  patient's activity barriers and safety.   Exercise Prescription Goal: Starting with aerobic activity 30 plus minutes a day, 3 days per week for initial exercise prescription. Provide home exercise prescription and guidelines that participant acknowledges understanding prior to discharge.  Activity Barriers & Risk Stratification:  Activity Barriers & Cardiac Risk Stratification - 01/16/21 0832       Activity Barriers & Cardiac Risk Stratification   Activity Barriers Deconditioning;Shortness of Breath;Joint Problems    Cardiac Risk Stratification High             6 Minute Walk:  6 Minute Walk     Row Name 01/16/21 1003         6 Minute Walk   Phase Initial     Distance  1000 feet     Walk Time 6 minutes     # of Rest Breaks 0     MPH 1.9     METS 2.32     RPE 13     VO2 Peak 8.13     Symptoms No     Resting HR 83  bpm     Resting BP 128/62     Resting Oxygen Saturation  95 %     Exercise Oxygen Saturation  during 6 min walk 96 %     Max Ex. HR 109 bpm     Max Ex. BP 150/65     2 Minute Post BP 130/65              Oxygen Initial Assessment:   Oxygen Re-Evaluation:   Oxygen Discharge (Final Oxygen Re-Evaluation):   Initial Exercise Prescription:  Initial Exercise Prescription - 01/16/21 1000       Date of Initial Exercise RX and Referring Provider   Date 01/16/21    Referring Provider Dr. Domenic Polite    Expected Discharge Date 04/11/21      NuStep   Level 1    SPM 70    Minutes 22      Arm Ergometer   Level 1    RPM 60    Minutes 17      Prescription Details   Frequency (times per week) 3    Duration Progress to 30 minutes of continuous aerobic without signs/symptoms of physical distress      Intensity   THRR 40-80% of Max Heartrate 61-122    Ratings of Perceived Exertion 11-15    Perceived Dyspnea 0-4      Resistance Training   Training Prescription Yes    Weight 3    Reps 10-15             Perform Capillary Blood Glucose checks as needed.  Exercise Prescription Changes:   Exercise Comments:   Exercise Goals and Review:   Exercise Goals     Row Name 01/16/21 1006             Exercise Goals   Increase Physical Activity Yes       Intervention Provide advice, education, support and counseling about physical activity/exercise needs.;Develop an individualized exercise prescription for aerobic and resistive training based on initial evaluation findings, risk stratification, comorbidities and participant's personal goals.       Expected Outcomes Short Term: Attend rehab on a regular basis to increase amount of physical activity.;Long Term: Add in home exercise to make exercise part of routine and to  increase amount of physical activity.;Long Term: Exercising regularly at least 3-5 days a week.       Increase Strength and Stamina Yes       Intervention Provide advice, education, support and counseling about physical activity/exercise needs.;Develop an individualized exercise prescription for aerobic and resistive training based on initial evaluation findings, risk stratification, comorbidities and participant's personal goals.       Expected Outcomes Short Term: Increase workloads from initial exercise prescription for resistance, speed, and METs.;Short Term: Perform resistance training exercises routinely during rehab and add in resistance training at home;Long Term: Improve cardiorespiratory fitness, muscular endurance and strength as measured by increased METs and functional capacity (6MWT)       Able to understand and use rate of perceived exertion (RPE) scale Yes       Intervention Provide education and explanation on how to use RPE scale       Expected Outcomes Short Term: Able to use RPE daily in rehab to express subjective intensity level;Long Term:  Able to use RPE to guide intensity level when exercising independently       Knowledge and understanding of Target Heart Rate Range (THRR) Yes       Intervention Provide education  and explanation of THRR including how the numbers were predicted and where they are located for reference       Expected Outcomes Short Term: Able to state/look up THRR;Short Term: Able to use daily as guideline for intensity in rehab;Long Term: Able to use THRR to govern intensity when exercising independently       Able to check pulse independently Yes       Intervention Provide education and demonstration on how to check pulse in carotid and radial arteries.;Review the importance of being able to check your own pulse for safety during independent exercise       Expected Outcomes Short Term: Able to explain why pulse checking is important during independent  exercise;Long Term: Able to check pulse independently and accurately       Understanding of Exercise Prescription Yes       Intervention Provide education, explanation, and written materials on patient's individual exercise prescription       Expected Outcomes Short Term: Able to explain program exercise prescription;Long Term: Able to explain home exercise prescription to exercise independently                Exercise Goals Re-Evaluation :    Discharge Exercise Prescription (Final Exercise Prescription Changes):   Nutrition:  Target Goals: Understanding of nutrition guidelines, daily intake of sodium <1553m, cholesterol <2068m calories 30% from fat and 7% or less from saturated fats, daily to have 5 or more servings of fruits and vegetables.  Biometrics:  Pre Biometrics - 01/16/21 1007       Pre Biometrics   Height '5\' 9"'  (1.753 m)    Weight 240 lb 8.4 oz (109.1 kg)    Waist Circumference 50 inches    Hip Circumference 42 inches    Waist to Hip Ratio 1.19 %    BMI (Calculated) 35.5    Triceps Skinfold 9 mm    % Body Fat 33 %    Grip Strength 24.3 kg    Flexibility 0 in    Single Leg Stand 3 seconds              Nutrition Therapy Plan and Nutrition Goals:   Nutrition Assessments:  Nutrition Assessments - 01/16/21 0828       MEDFICTS Scores   Pre Score 15            MEDIFICTS Score Key: ?70 Need to make dietary changes  40-70 Heart Healthy Diet ? 40 Therapeutic Level Cholesterol Diet   Picture Your Plate Scores: <4<91nhealthy dietary pattern with much room for improvement. 41-50 Dietary pattern unlikely to meet recommendations for good health and room for improvement. 51-60 More healthful dietary pattern, with some room for improvement.  >60 Healthy dietary pattern, although there may be some specific behaviors that could be improved.    Nutrition Goals Re-Evaluation:   Nutrition Goals Discharge (Final Nutrition Goals  Re-Evaluation):   Psychosocial: Target Goals: Acknowledge presence or absence of significant depression and/or stress, maximize coping skills, provide positive support system. Participant is able to verbalize types and ability to use techniques and skills needed for reducing stress and depression.  Initial Review & Psychosocial Screening:  Initial Psych Review & Screening - 01/16/21 0852       Initial Review   Current issues with Current Sleep Concerns   takes OTC meds for his sleep, he reports it helps sometimes and sometimes it does not     FaSanta ClausYes  Comments He has a good support system. His wife, son, daughter in law, and grandchildren all support him. His mother, father, brother, and sister live in [place], [place]. He frequently talks with them and visits them.      Barriers   Psychosocial barriers to participate in program The patient should benefit from training in stress management and relaxation.      Screening Interventions   Interventions Encouraged to exercise;To provide support and resources with identified psychosocial needs    Expected Outcomes Long Term Goal: Stressors or current issues are controlled or eliminated.;Short Term goal: Identification and review with participant of any Quality of Life or Depression concerns found by scoring the questionnaire.;Long Term goal: The participant improves quality of Life and PHQ9 Scores as seen by post scores and/or verbalization of changes             Quality of Life Scores:  Quality of Life - 01/16/21 1008       Quality of Life   Select Quality of Life      Quality of Life Scores   Health/Function Pre 21 %    Socioeconomic Pre 29.25 %    Psych/Spiritual Pre 24 %    Family Pre 28.8 %    GLOBAL Pre 24.71 %            Scores of 19 and below usually indicate a poorer quality of life in these areas.  A difference of  2-3 points is a clinically meaningful difference.  A difference of  2-3 points in the total score of the Quality of Life Index has been associated with significant improvement in overall quality of life, self-image, physical symptoms, and general health in studies assessing change in quality of life.  PHQ-9: Recent Review Flowsheet Data     Depression screen [facility] 2/9 01/16/2021 11/06/2020 09/17/2020 08/05/2020 07/22/2020   Decreased Interest 0 0 0 0 0   Down, Depressed, Hopeless 0 0 0 0 0   PHQ - 2 Score 0 0 0 0 0   Altered sleeping 1 3 - - -   Tired, decreased energy 1 2 - - -   Change in appetite 1 1 - - -   Feeling bad or failure about yourself  0 0 - - -   Trouble concentrating 0 0 - - -   Moving slowly or fidgety/restless 0 0 - - -   Suicidal thoughts 0 0 - - -   PHQ-9 Score 3 6 - - -   Difficult doing work/chores Not difficult at all Not difficult at all - - -      Interpretation of Total Score  Total Score Depression Severity:  1-4 = Minimal depression, 5-9 = Mild depression, 10-14 = Moderate depression, 15-19 = Moderately severe depression, 20-27 = Severe depression   Psychosocial Evaluation and Intervention:  Psychosocial Evaluation - 01/16/21 0911       Psychosocial Evaluation & Interventions   Interventions Encouraged to exercise with the program and follow exercise prescription;Stress management education;Relaxation education    Comments Pt has no barriers to participating in cardiac rehab. This is his third time in the program in the past year. The first two times he was discharged early due to chest pain and subsequent interventions were performed (stent the first time and CABG x4 the second time). He still does not sleep well. He takes OTC medications for this and reports that it helps occasionally. He has been walking almost everyday and reports that his energy  is coming back and he no longer has chest pain when exerting heimself. He scored a 3 on his PHQ-9 and this relates to sleep and energy. He copes well on his own and has a good support  system with his wife, son, daughter in law, and grand daughter. He has a positive outlook on rehab and jokes that he will actually complete the program this time. 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 His goals are to lose some weight and to continue to increase his stamina.    Expected Outcomes Pt will continue to not have any identifiable psychosocial issues.    Continue Psychosocial Services  No Follow up required             Psychosocial Re-Evaluation:   Psychosocial Discharge (Final Psychosocial Re-Evaluation):   Vocational Rehabilitation: Provide vocational rehab assistance to qualifying candidates.   Vocational Rehab Evaluation & Intervention:  Vocational Rehab - 01/16/21 3382       Initial Vocational Rehab Evaluation & Intervention   Assessment shows need for Vocational Rehabilitation No      Vocational Rehab Re-Evaulation   Comments Patient is retired and has no desire to return back to a job with any significant physical requirements.             Education: Education Goals: Education classes will be provided on a weekly basis, covering required topics. Participant will state understanding/return demonstration of topics presented.  Learning Barriers/Preferences:  Learning Barriers/Preferences - 01/16/21 0829       Learning Barriers/Preferences   Learning Barriers None    Learning Preferences Audio;Verbal Instruction             Education Topics: Hypertension, Hypertension Reduction -Define heart disease and high blood pressure. Discus how high blood pressure affects the body and ways to reduce high blood pressure. Flowsheet Row CARDIAC REHAB PHASE II EXERCISE from 07/31/2020 in [place]  Date 07/03/20  Educator mk  Instruction Review Code 2- Demonstrated Understanding       Exercise and Your Heart -Discuss why it is important to exercise,  the FITT principles of exercise, normal and abnormal responses to exercise, and how to exercise safely. Flowsheet Row CARDIAC REHAB PHASE II EXERCISE from 07/31/2020 in [place]  Date 07/10/20  Educator mk  Instruction Review Code 2- Demonstrated Understanding       Angina -Discuss definition of angina, causes of angina, treatment of angina, and how to decrease risk of having angina. Flowsheet Row CARDIAC REHAB PHASE II EXERCISE from 07/31/2020 in [place]  Date 07/17/20  Educator [facility]  Instruction Review Code 2- Demonstrated Understanding       Cardiac Medications -Review what the following cardiac medications are used for, how they affect the body, and side effects that may occur when taking the medications.  Medications include Aspirin, Beta blockers, calcium channel blockers, ACE Inhibitors, angiotensin receptor blockers, diuretics, digoxin, and antihyperlipidemics. Flowsheet Row CARDIAC REHAB PHASE II EXERCISE from 07/31/2020 in [place]  Date 07/24/20  Educator [facility]  Instruction Review Code 2- Demonstrated Understanding       Congestive Heart Failure -Discuss the definition of CHF, how to live with CHF, the signs and symptoms of CHF, and how keep track of weight and sodium intake. Flowsheet Row CARDIAC REHAB PHASE II EXERCISE from 07/31/2020 in [place]  Date 07/31/20  Educator mk  Instruction Review Code 2- Demonstrated Understanding       Heart Disease and Intimacy -Discus the effect sexual activity has on the heart, how changes occur during intimacy as we age, and safety during sexual activity. Flowsheet Row CARDIAC REHAB PHASE II EXERCISE from 07/31/2020 in [place]  Date 05/08/20  Educator [facility]  Instruction Review Code 2- Demonstrated Understanding       Smoking Cessation / COPD -Discuss different methods to quit smoking, the health benefits of  quitting smoking, and the definition of COPD.   Nutrition I: Fats -Discuss the types of cholesterol, what cholesterol does to the heart, and how cholesterol levels can be controlled.   Nutrition II: Labels -Discuss the different components of food labels and how to read food label   Heart Parts/Heart Disease and PAD -Discuss the anatomy of the heart, the pathway of blood circulation through the heart, and these are affected by heart disease.   Stress I: Signs and Symptoms -Discuss the causes of stress, how stress may lead to anxiety and depression, and ways to limit stress.   Stress II: Relaxation -Discuss different types of relaxation techniques to limit stress. Flowsheet Row CARDIAC REHAB PHASE II EXERCISE from 07/31/2020 in [place]  Date 06/19/20  Educator mk  Instruction Review Code 2- Demonstrated Understanding       Warning Signs of Stroke / TIA -Discuss definition of a stroke, what the signs and symptoms are of a stroke, and how to identify when someone is having stroke. Flowsheet Row CARDIAC REHAB PHASE II EXERCISE from 07/31/2020 in [place]  Date 06/26/20  Educator mk  Instruction Review Code 2- Demonstrated Understanding       Knowledge Questionnaire Score:  Knowledge Questionnaire Score - 01/16/21 7902       Knowledge Questionnaire Score   Pre Score 22/24             Core Components/Risk Factors/Patient Goals at Admission:  Personal Goals and Risk Factors at Admission - 01/16/21 0856       Core Components/Risk Factors/Patient Goals on Admission    Weight Management Yes    Intervention Weight Management: Develop a combined nutrition and exercise program designed to reach desired caloric intake, while maintaining appropriate intake of nutrient and fiber, sodium and fats, and appropriate energy expenditure required for the weight goal.;Weight Management: Provide education and appropriate resources to  help participant work on and attain dietary goals.;Weight Management/Obesity: Establish reasonable short term and long term weight goals.;Obesity: Provide education and appropriate resources to help participant work on and attain dietary goals.    Expected Outcomes Short Term: Continue to assess and modify interventions until short term weight is achieved;Long Term: Adherence to nutrition and physical activity/exercise program aimed toward attainment of established weight goal;Weight Loss: Understanding of general recommendations for a balanced deficit meal plan, which promotes 1-2 lb weight loss per week and includes a negative energy balance of [phone] kcal/d;Understanding recommendations for meals to include 15-35% energy as protein, 25-35% energy from fat, 35-60% energy from carbohydrates, less than 232m of dietary cholesterol, 20-35 gm of total fiber daily;Understanding of distribution of calorie intake throughout the day with the consumption of 4-5 meals/snacks    Improve shortness of breath with ADL's Yes    Intervention Provide education, individualized exercise plan and daily activity instruction to help decrease symptoms of SOB with activities of daily living.    Expected Outcomes Short Term: Improve cardiorespiratory fitness to achieve a reduction of  symptoms when performing ADLs;Long Term: Be able to perform more ADLs without symptoms or delay the onset of symptoms    Diabetes Yes    Intervention Provide education about signs/symptoms and action to take for hypo/hyperglycemia.;Provide education about proper nutrition, including hydration, and aerobic/resistive exercise prescription along with prescribed medications to achieve blood glucose in normal ranges: Fasting glucose 65-99 mg/dL    Expected Outcomes Short Term: Participant verbalizes understanding of the signs/symptoms and immediate care of hyper/hypoglycemia, proper foot care and importance of medication, aerobic/resistive exercise and  nutrition plan for blood glucose control.;Long Term: Attainment of HbA1C < 7%.             Core Components/Risk Factors/Patient Goals Review:    Core Components/Risk Factors/Patient Goals at Discharge (Final Review):    ITP Comments:   Comments: Patient arrived for 1st visit/orientation/education at 0800. Patient was referred to CR by Dr. Domenic Polite due to CABG x4 (Z95.1) and NSTEMI (I21.4). During orientation advised patient on arrival and appointment times what to wear, what to do before, during and after exercise. Reviewed attendance and class policy.  Pt is scheduled to return Cardiac Rehab on 01/20/2021 at 0930. Pt was advised to come to class 15 minutes before class starts.  Discussed RPE/Dpysnea scales. Patient participated in warm up stretches. Patient was able to complete 6 minute walk test.  Telemetry: NSR. Patient was measured for the equipment. Discussed equipment safety with patient. Took patient pre-anthropometric measurements. Patient finished visit at 0900.

## 2021-01-20 ENCOUNTER — Encounter (HOSPITAL_COMMUNITY)
Admission: RE | Admit: 2021-01-20 | Discharge: 2021-01-20 | Disposition: A | Discharge status: Home / Self Care | Payer: Medicare PPO | Source: Ambulatory Visit | Attending: Cardiology | Admitting: Cardiology

## 2021-01-20 VITALS — Wt 240.5 lb

## 2021-01-20 DIAGNOSIS — Z951 Presence of aortocoronary bypass graft: Secondary | ICD-10-CM | POA: Diagnosis not present

## 2021-01-20 DIAGNOSIS — I214 Non-ST elevation (NSTEMI) myocardial infarction: Secondary | ICD-10-CM

## 2021-01-20 NOTE — Progress Notes (Signed)
Daily Session Note  Patient Details  Name: [name] [name] MRN: [id number] Date of Birth: [birth year] Referring Provider:   Flowsheet Row CARDIAC REHAB PHASE II ORIENTATION from 01/16/2021 in [place]  Referring Provider Dr. Domenic Polite       Encounter Date: 01/20/2021  Check In:  Session Check In - 01/20/21 0930       Check-In   Supervising physician immediately available to respond to emergencies [facility] MD immediately available    Physician(s) Dr. Harrington Challenger    Location [facility]    Staff Present Hoy Register, MS, ACSM-CEP, Exercise Physiologist;Phyllis Billingsley, RN    Virtual Visit No    Medication changes reported     No    Fall or balance concerns reported    No    Tobacco Cessation No Change    Warm-up and Cool-down Performed as group-led instruction    Resistance Training Performed Yes    VAD Patient? No    PAD/SET Patient? No      Pain Assessment   Currently in Pain? No/denies    Pain Score 0-No pain    Multiple Pain Sites No             Capillary Blood Glucose: No results found for this or any previous visit (from the past 24 hour(s)).    Social History   Tobacco Use  Smoking Status Never  Smokeless Tobacco Never    Goals Met:  Independence with exercise equipment Exercise tolerated well No report of concerns or symptoms today Strength training completed today  Goals Unmet:  Not Applicable  Comments: checkout time is 96   Dr. Kathie Dike is Medical Director for [facility].

## 2021-01-22 ENCOUNTER — Encounter (HOSPITAL_COMMUNITY)
Admission: RE | Admit: 2021-01-22 | Discharge: 2021-01-22 | Disposition: A | Discharge status: Home / Self Care | Payer: Medicare PPO | Source: Ambulatory Visit | Attending: Cardiology | Admitting: Cardiology

## 2021-01-22 DIAGNOSIS — I214 Non-ST elevation (NSTEMI) myocardial infarction: Secondary | ICD-10-CM | POA: Diagnosis not present

## 2021-01-22 DIAGNOSIS — Z951 Presence of aortocoronary bypass graft: Secondary | ICD-10-CM

## 2021-01-22 NOTE — Progress Notes (Signed)
Daily Session Note  Patient Details  Name: [name] [name] MRN: [id number] Date of Birth: 04-04-1955 Referring Provider:   Flowsheet Row CARDIAC REHAB PHASE II ORIENTATION from 01/16/2021 in [place]  Referring Provider Dr. Domenic Polite       Encounter Date: 01/22/2021  Check In:  Session Check In - 01/22/21 0945       Check-In   Supervising physician immediately available to respond to emergencies [facility] MD immediately available    Physician(s) Dr. Harl Bowie    Location [facility]    Staff Present Hoy Register, MS, ACSM-CEP, Exercise Physiologist;Dymond Spreen Zigmund Daniel, Exercise Physiologist    Virtual Visit No    Medication changes reported     No    Fall or balance concerns reported    No    Tobacco Cessation No Change    Warm-up and Cool-down Performed as group-led instruction    Resistance Training Performed Yes    VAD Patient? No    PAD/SET Patient? No      Pain Assessment   Currently in Pain? No/denies    Pain Score 0-No pain    Multiple Pain Sites No             Capillary Blood Glucose: No results found for this or any previous visit (from the past 24 hour(s)).    Social History   Tobacco Use  Smoking Status Never  Smokeless Tobacco Never    Goals Met:  Independence with exercise equipment Exercise tolerated well No report of concerns or symptoms today Strength training completed today  Goals Unmet:  Not Applicable  Comments: check out 1030   Dr. Kathie Dike is Medical Director for [facility].

## 2021-01-24 ENCOUNTER — Other Ambulatory Visit: Payer: Self-pay | Admitting: Nurse Practitioner

## 2021-01-24 ENCOUNTER — Encounter (HOSPITAL_COMMUNITY)
Admission: RE | Admit: 2021-01-24 | Discharge: 2021-01-24 | Disposition: A | Discharge status: Home / Self Care | Payer: Medicare PPO | Source: Ambulatory Visit | Attending: Cardiology | Admitting: Cardiology

## 2021-01-24 ENCOUNTER — Other Ambulatory Visit: Payer: Self-pay

## 2021-01-24 DIAGNOSIS — I214 Non-ST elevation (NSTEMI) myocardial infarction: Secondary | ICD-10-CM | POA: Diagnosis not present

## 2021-01-24 DIAGNOSIS — E1159 Type 2 diabetes mellitus with other circulatory complications: Secondary | ICD-10-CM

## 2021-01-24 DIAGNOSIS — Z951 Presence of aortocoronary bypass graft: Secondary | ICD-10-CM

## 2021-01-24 DIAGNOSIS — Z794 Long term (current) use of insulin: Secondary | ICD-10-CM

## 2021-01-24 NOTE — Progress Notes (Signed)
Daily Session Note  Patient Details  Name: [name] [name] MRN: [id number] Date of Birth: 01-08-1956 Referring Provider:   Flowsheet Row CARDIAC REHAB PHASE II ORIENTATION from 01/16/2021 in [place]  Referring Provider Dr. Domenic Polite       Encounter Date: 01/24/2021  Check In:  Session Check In - 01/24/21 0915       Check-In   Supervising physician immediately available to respond to emergencies [facility] MD immediately available    Physician(s) Dr. Harl Bowie    Location [facility]    Staff Present Hoy Register, MS, ACSM-CEP, Exercise Physiologist;Other    Virtual Visit No    Medication changes reported     No    Fall or balance concerns reported    No    Tobacco Cessation No Change    Warm-up and Cool-down Performed as group-led instruction    Resistance Training Performed Yes    VAD Patient? No    PAD/SET Patient? No      Pain Assessment   Currently in Pain? No/denies    Pain Score 0-No pain    Multiple Pain Sites No             Capillary Blood Glucose: No results found for this or any previous visit (from the past 24 hour(s)).    Social History   Tobacco Use  Smoking Status Never  Smokeless Tobacco Never    Goals Met:  Independence with exercise equipment Exercise tolerated well No report of concerns or symptoms today Strength training completed today  Goals Unmet:  Not Applicable  Comments: checkout time is 82   Dr. Kathie Dike is Medical Director for [facility] Pul[facility] Re[facility].

## 2021-01-27 ENCOUNTER — Other Ambulatory Visit: Payer: Self-pay | Admitting: Nurse Practitioner

## 2021-01-27 ENCOUNTER — Other Ambulatory Visit: Payer: Self-pay

## 2021-01-27 ENCOUNTER — Encounter (HOSPITAL_COMMUNITY)
Admission: RE | Admit: 2021-01-27 | Discharge: 2021-01-27 | Disposition: A | Discharge status: Home / Self Care | Payer: Medicare PPO | Source: Ambulatory Visit | Attending: Cardiology | Admitting: Cardiology

## 2021-01-27 DIAGNOSIS — Z951 Presence of aortocoronary bypass graft: Secondary | ICD-10-CM

## 2021-01-27 DIAGNOSIS — I214 Non-ST elevation (NSTEMI) myocardial infarction: Secondary | ICD-10-CM | POA: Diagnosis not present

## 2021-01-27 NOTE — Progress Notes (Signed)
Daily Session Note  Patient Details  Name: [name] [name] MRN: [id number] Date of Birth: 12/04/1955 Referring Provider:   Flowsheet Row CARDIAC REHAB PHASE II ORIENTATION from 01/16/2021 in [place]  Referring Provider Dr. Domenic Polite       Encounter Date: 01/27/2021  Check In:  Session Check In - 01/27/21 0930       Check-In   Supervising physician immediately available to respond to emergencies [facility] MD immediately available    Physician(s) Dr. Domenic Polite    Location [facility]    Staff Present Hoy Register, MS, ACSM-CEP, Exercise Physiologist;Teigan Sahli Wynetta Emery, RN, BSN    Virtual Visit No    Medication changes reported     No    Fall or balance concerns reported    No    Tobacco Cessation No Change    Warm-up and Cool-down Performed as group-led instruction    Resistance Training Performed Yes    VAD Patient? No    PAD/SET Patient? No      Pain Assessment   Currently in Pain? No/denies    Pain Score 0-No pain    Multiple Pain Sites No             Capillary Blood Glucose: No results found for this or any previous visit (from the past 24 hour(s)).    Social History   Tobacco Use  Smoking Status Never  Smokeless Tobacco Never    Goals Met:  Independence with exercise equipment Exercise tolerated well No report of concerns or symptoms today Strength training completed today  Goals Unmet:  Not Applicable  Comments: Check out 1030.   Dr. Kathie Dike is Medical Director for [facility] Pul[facility] Re[facility].

## 2021-01-29 ENCOUNTER — Encounter (HOSPITAL_COMMUNITY)
Admission: RE | Admit: 2021-01-29 | Discharge: 2021-01-29 | Disposition: A | Discharge status: Home / Self Care | Payer: Medicare PPO | Source: Ambulatory Visit | Attending: Cardiology | Admitting: Cardiology

## 2021-01-29 DIAGNOSIS — I214 Non-ST elevation (NSTEMI) myocardial infarction: Secondary | ICD-10-CM | POA: Insufficient documentation

## 2021-01-29 DIAGNOSIS — Z951 Presence of aortocoronary bypass graft: Secondary | ICD-10-CM | POA: Diagnosis not present

## 2021-01-29 NOTE — Progress Notes (Addendum)
Daily Session Note  Patient Details  Name: [name] [name] MRN: [id number] Date of Birth: [birth year] Referring Provider:   Flowsheet Row CARDIAC REHAB PHASE II ORIENTATION from 01/16/2021 in [place]  Referring Provider Dr. Domenic Polite       Encounter Date: 01/29/2021  Check In:  Session Check In - 01/29/21 0930       Check-In   Supervising physician immediately available to respond to emergencies [facility] MD immediately available    Physician(s) Dr. Marisue Ivan    Location [facility]    Staff Present Hoy Register, MS, ACSM-CEP, Exercise Physiologist;Tonni Mansour Wynetta Emery, RN, BSN    Virtual Visit No    Fall or balance concerns reported    No    Tobacco Cessation No Change    Warm-up and Cool-down Performed as group-led instruction    VAD Patient? No    PAD/SET Patient? No      Pain Assessment   Currently in Pain? No/denies    Pain Score 0-No pain    Multiple Pain Sites No             Capillary Blood Glucose: No results found for this or any previous visit (from the past 24 hour(s)).    Social History   Tobacco Use  Smoking Status Never  Smokeless Tobacco Never    Goals Met:  Independence with exercise equipment Exercise tolerated well No report of concerns or symptoms today Strength training completed today  Goals Unmet:  Not Applicable  Comments: At the end of second station, patient complained of nausea and pain starting in his back going to his chest 4/10 which has been chronic for him. He was diaphoretic and pale. 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 He checked his glucose: 65 mg/dl. We started hypoglycemic protocol with juice and soda. Also gave him crackers. He is on both short and long acting insulin. Recheck glucose was 75 mg/dl. He felt better. Nausea had subsided and his pain was 0/10. Advised patient to eat something when he got home. His wife was with him and he stated they would stop and get something on the way  home.  Check out 1030.   Dr. Kathie Dike is Medical Director for [facility] Re[facility].

## 2021-01-31 ENCOUNTER — Encounter (HOSPITAL_COMMUNITY)
Admission: RE | Admit: 2021-01-31 | Discharge: 2021-01-31 | Disposition: A | Discharge status: Home / Self Care | Payer: Medicare PPO | Source: Ambulatory Visit | Attending: Cardiology | Admitting: Cardiology

## 2021-01-31 DIAGNOSIS — Z951 Presence of aortocoronary bypass graft: Secondary | ICD-10-CM | POA: Diagnosis not present

## 2021-01-31 DIAGNOSIS — I214 Non-ST elevation (NSTEMI) myocardial infarction: Secondary | ICD-10-CM | POA: Diagnosis not present

## 2021-01-31 NOTE — Progress Notes (Signed)
Daily Session Note  Patient Details  Name: [name] [name] MRN: [id number] Date of Birth: Jul 30, 1955 Referring Provider:   Flowsheet Row CARDIAC REHAB PHASE II ORIENTATION from 01/16/2021 in [place]  Referring Provider Dr. Domenic Polite       Encounter Date: 01/31/2021  Check In:  Session Check In - 01/31/21 0930       Check-In   Supervising physician immediately available to respond to emergencies [facility] MD immediately available    Physician(s) Dr. Domenic Polite    Location [facility]    Staff Present Hoy Register, MS, ACSM-CEP, Exercise Physiologist;Kedra Mcglade Wynetta Emery, RN, BSN    Virtual Visit No    Medication changes reported     No    Fall or balance concerns reported    No    Tobacco Cessation No Change    Warm-up and Cool-down Performed as group-led instruction    Resistance Training Performed Yes    VAD Patient? No    PAD/SET Patient? No      Pain Assessment   Currently in Pain? No/denies    Pain Score 0-No pain    Multiple Pain Sites No             Capillary Blood Glucose: No results found for this or any previous visit (from the past 24 hour(s)).    Social History   Tobacco Use  Smoking Status Never  Smokeless Tobacco Never    Goals Met:  Independence with exercise equipment Exercise tolerated well No report of concerns or symptoms today Strength training completed today  Goals Unmet:  Not Applicable  Comments: Check out 1030.   Dr. Kathie Dike is Medical Director for [facility].

## 2021-02-03 ENCOUNTER — Encounter (HOSPITAL_COMMUNITY)
Admission: RE | Admit: 2021-02-03 | Discharge: 2021-02-03 | Disposition: A | Discharge status: Home / Self Care | Payer: Medicare PPO | Source: Ambulatory Visit | Attending: Cardiology | Admitting: Cardiology

## 2021-02-03 VITALS — Wt 245.2 lb

## 2021-02-03 DIAGNOSIS — I214 Non-ST elevation (NSTEMI) myocardial infarction: Secondary | ICD-10-CM

## 2021-02-03 DIAGNOSIS — Z951 Presence of aortocoronary bypass graft: Secondary | ICD-10-CM

## 2021-02-03 NOTE — Progress Notes (Signed)
I have reviewed a Home Exercise Prescription with [name] [name] . [name] [name] is currently exercising at home.  The patient was advised to walk 2 days a week for 30-45 minutes.  [name] and I discussed how to progress their exercise prescription.  The patient stated that their goals were gain strength and be healthy.  The patient stated that they understand the exercise prescription.  We reviewed exercise guidelines, target heart rate during exercise, RPE Scale, weather conditions, NTG use, endpoints for exercise, warmup and cool down.  Patient is encouraged to come to me with any questions. I will continue to follow up with the patient to assist them with progression and safety.

## 2021-02-03 NOTE — Progress Notes (Signed)
Daily Session Note  Patient Details  Name: [name] [name] MRN: [id number] Date of Birth: 29-Mar-1956 Referring Provider:   Flowsheet Row CARDIAC REHAB PHASE II ORIENTATION from 01/16/2021 in [place]  Referring Provider Dr. Domenic Polite       Encounter Date: 02/03/2021  Check In:  Session Check In - 02/03/21 0930       Check-In   Supervising physician immediately available to respond to emergencies [facility] MD immediately available    Physician(s) Dr. Ralene Ok    Location [facility]    Staff Present Hoy Register, MS, ACSM-CEP, Exercise Physiologist;Kimba Lottes Zigmund Daniel, Exercise Physiologist    Virtual Visit No    Medication changes reported     No    Fall or balance concerns reported    No    Tobacco Cessation No Change    Warm-up and Cool-down Performed as group-led instruction    Resistance Training Performed Yes    VAD Patient? No    PAD/SET Patient? No      Pain Assessment   Currently in Pain? No/denies    Pain Score 0-No pain    Multiple Pain Sites No             Capillary Blood Glucose: No results found for this or any previous visit (from the past 24 hour(s)).    Social History   Tobacco Use  Smoking Status Never  Smokeless Tobacco Never    Goals Met:  Independence with exercise equipment Exercise tolerated well No report of concerns or symptoms today Strength training completed today  Goals Unmet:  Not Applicable  Comments: check out 1030   Dr. Kathie Dike is Medical Director for [facility] Pul[facility] Re[facility].

## 2021-02-05 ENCOUNTER — Encounter (HOSPITAL_COMMUNITY): Payer: Medicare PPO

## 2021-02-05 NOTE — Progress Notes (Signed)
Cardiac Individual Treatment Plan  Patient Details  Name: [name] [name] MRN: [id number] Date of Birth: Oct 13, 1955 Referring Provider:   Flowsheet Row CARDIAC REHAB PHASE II ORIENTATION from 01/16/2021 in [place]  Referring Provider Dr. Domenic Polite       Initial Encounter Date:  Flowsheet Row CARDIAC REHAB PHASE II ORIENTATION from 01/16/2021 in [place]  Date 01/16/21       Visit Diagnosis: S/P CABG x 4  NSTEMI (non-ST elevated myocardial infarction) ([place])  Patient's Home Medications on Admission:  Current Outpatient Medications:    Accu-Chek Softclix Lancets lancets, USE TO CHECK BLOOD SUGAR 4 TIMES DAILY. Dx E11.8, Disp: 100 each, Rfl: PRN   aspirin EC 325 MG EC tablet, Take 1 tablet (325 mg total) by mouth daily., Disp: 30 tablet, Rfl: 0   atorvastatin (LIPITOR) 80 MG tablet, Take 1 tablet (80 mg total) by mouth daily., Disp: 90 tablet, Rfl: 3   blood glucose meter kit and supplies, Dispense based on patient and insurance preference. Use up to four times daily as directed. (FOR ICD-10 E10.9, E11.9)., Disp: 1 each, Rfl: 0   carvedilol (COREG) 12.5 MG tablet, Take 1.5 tablets (18.75 mg total) by mouth 2 (two) times daily with a meal., Disp: 60 tablet, Rfl: 3   cetirizine (ZYRTEC) 10 MG tablet, Take 10 mg by mouth daily., Disp: , Rfl:    CINNAMON PO, Take 1 tablet by mouth daily., Disp: , Rfl:    CRANBERRY PO, Take 1 tablet by mouth 2 (two) times daily., Disp: , Rfl:    furosemide (LASIX) 20 MG tablet, Take 20 mg daily as needed for leg swelling, Disp: 90 tablet, Rfl: 3   GLOBAL EASE INJECT PEN NEEDLES 31G X 8 MM MISC, Use up to 8 times a day Dx E11.8, Disp: 800 each, Rfl: 3   glucose blood (ACCU-CHEK AVIVA PLUS) test strip, Check BS up to 4 times a day dx E11.8, Disp: 100 strip, Rfl: 11   Insulin Aspart FlexPen (NOVOLOG) 100 UNIT/ML, INJECT 45-75 UNITS AT BREAKFAST AND DINNER., Disp: 15 mL, Rfl: 0   insulin glargine (LANTUS  SOLOSTAR) 100 UNIT/ML Solostar Pen, 44u in morning and 75 at night (Patient taking differently: Inject 44-75 Units into the skin See admin instructions. Inject 44 units in morning and 75 units at night.), Disp: 30 mL, Rfl: 3   isosorbide mononitrate (IMDUR) 30 MG 24 hr tablet, Take 0.5 tablets (15 mg total) by mouth every evening., Disp: 45 tablet, Rfl: 3   lisinopril (ZESTRIL) 2.5 MG tablet, Take 1 tablet (2.5 mg total) by mouth daily., Disp: 90 tablet, Rfl: 2   metFORMIN (GLUCOPHAGE) 500 MG tablet, TAKE 2 TABLETS BY MOUTH TWICE DAILY., Disp: 120 tablet, Rfl: 0   pantoprazole (PROTONIX) 40 MG tablet, Take 1 tablet (40 mg total) by mouth daily as needed (for acid reflux)., Disp: 90 tablet, Rfl: 3   Semaglutide,0.25 or 0.5MG/DOS, (OZEMPIC, 0.25 OR 0.5 MG/DOSE,) 2 MG/1.5ML SOPN, Inject 0.5 mg into the skin once a week., Disp: 6 mL, Rfl: 5   sulfamethoxazole-trimethoprim (BACTRIM DS) 800-160 MG tablet, Take 1 tablet by mouth 2 (two) times daily. (Patient not taking: Reported on 01/13/2021), Disp: 20 tablet, Rfl: 0   tamsulosin (FLOMAX) 0.4 MG CAPS capsule, Take 1 capsule (0.4 mg total) by mouth at bedtime., Disp: 90 capsule, Rfl: 1   traMADol (ULTRAM) 50 MG tablet, Take 1 tablet (50 mg total) by mouth every 6 (six) hours as needed for severe pain., Disp: 30  tablet, Rfl: 0  Past Medical History: Past Medical History:  Diagnosis Date   Anxiety    Coronary atherosclerosis of native coronary artery    DES distal circumflex 2005; DES LAD/diagonal bifurcation 09/2010; DES ostial LAD and DES left PL 01/2020; CABG May 2022 (LIMA to LAD, SVG to diagonal, SVG to OM1 and OM 3)   Essential hypertension    GERD (gastroesophageal reflux disease)    History of kidney stones    Mixed hyperlipidemia    Myocardial infarction ([place])    Anterolateral with VF arrest 7/12   Type 2 diabetes mellitus ([place])     Tobacco Use: Social History   Tobacco Use  Smoking Status Never  Smokeless Tobacco Never     Labs: Recent Review Flowsheet Data     Labs for ITP Cardiac and Pulmonary Rehab Latest Ref Rng & Units 08/21/2020 08/21/2020 08/21/2020 08/21/2020 11/06/2020   Cholestrol 100 - 199 mg/dL - - - - 87(L)   LDLCALC 0 - 99 mg/dL - - - - 36   HDL >39 mg/dL - - - - 19(L)   Trlycerides 0 - 149 mg/dL - - - - 198(H)   Hemoglobin A1c <7.0 % - - - - 6.9   PHART 7.350 - 7.450 7.281(L) 7.335(L) 7.335(L) 7.364 -   PCO2ART 32.0 - 48.0 mmHg 50.5(H) 43.6 43.0 40.2 -   HCO3 20.0 - 28.0 mmol/L 23.8 23.2 22.9 22.8 -   TCO2 22 - 32 mmol/L '25 25 24 24 ' -   ACIDBASEDEF 0.0 - 2.0 mmol/L 3.0(H) 3.0(H) 3.0(H) 2.0 -   O2SAT % 95.0 98.0 97.0 97.0 -       Capillary Blood Glucose: Lab Results  Component Value Date   GLUCAP 136 (H) 08/26/2020   GLUCAP 209 (H) 08/25/2020   GLUCAP 221 (H) 08/25/2020   GLUCAP 203 (H) 08/25/2020   GLUCAP 86 08/25/2020    POCT Glucose     Row Name 01/16/21 0830 01/20/21 1255           POCT Blood Glucose   Pre-Exercise 131 mg/dL --      Pre-Exercise #2 -- 141 mg/dL               Exercise Target Goals: Exercise Program Goal: Individual exercise prescription set using results from initial 6 min walk test and THRR while considering  patient's activity barriers and safety.   Exercise Prescription Goal: Starting with aerobic activity 30 plus minutes a day, 3 days per week for initial exercise prescription. Provide home exercise prescription and guidelines that participant acknowledges understanding prior to discharge.  Activity Barriers & Risk Stratification:  Activity Barriers & Cardiac Risk Stratification - 01/16/21 0832       Activity Barriers & Cardiac Risk Stratification   Activity Barriers Deconditioning;Shortness of Breath;Joint Problems    Cardiac Risk Stratification High             6 Minute Walk:  6 Minute Walk     Row Name 01/16/21 1003         6 Minute Walk   Phase Initial     Distance 1000 feet     Walk Time 6 minutes     # of Rest  Breaks 0     MPH 1.9     METS 2.32     RPE 13     VO2 Peak 8.13     Symptoms No     Resting HR 83 bpm     Resting BP 128/62  Resting Oxygen Saturation  95 %     Exercise Oxygen Saturation  during 6 min walk 96 %     Max Ex. HR 109 bpm     Max Ex. BP 150/65     2 Minute Post BP 130/65              Oxygen Initial Assessment:   Oxygen Re-Evaluation:   Oxygen Discharge (Final Oxygen Re-Evaluation):   Initial Exercise Prescription:  Initial Exercise Prescription - 01/16/21 1000       Date of Initial Exercise RX and Referring Provider   Date 01/16/21    Referring Provider Dr. Domenic Polite    Expected Discharge Date 04/11/21      NuStep   Level 1    SPM 70    Minutes 22      Arm Ergometer   Level 1    RPM 60    Minutes 17      Prescription Details   Frequency (times per week) 3    Duration Progress to 30 minutes of continuous aerobic without signs/symptoms of physical distress      Intensity   THRR 40-80% of Max Heartrate 61-122    Ratings of Perceived Exertion 11-15    Perceived Dyspnea 0-4      Resistance Training   Training Prescription Yes    Weight 3    Reps 10-15             Perform Capillary Blood Glucose checks as needed.  Exercise Prescription Changes:   Exercise Prescription Changes     Row Name 01/20/21 1200 02/03/21 0900 02/03/21 1400         Response to Exercise   Blood Pressure (Admit) 122/72 122/72 130/70     Blood Pressure (Exercise) 136/62 136/62 140/70     Blood Pressure (Exit) 124/60 124/60 135/70     Heart Rate (Admit) 88 bpm 88 bpm 92 bpm     Heart Rate (Exercise) 110 bpm 110 bpm 109 bpm     Heart Rate (Exit) 90 bpm 90 bpm 101 bpm     Rating of Perceived Exertion (Exercise) '12 12 12     ' Duration Continue with 30 min of aerobic exercise without signs/symptoms of physical distress. Continue with 30 min of aerobic exercise without signs/symptoms of physical distress. Continue with 30 min of aerobic exercise without  signs/symptoms of physical distress.     Intensity THRR unchanged THRR unchanged THRR unchanged       Progression   Progression Continue to progress workloads to maintain intensity without signs/symptoms of physical distress. Continue to progress workloads to maintain intensity without signs/symptoms of physical distress. Continue to progress workloads to maintain intensity without signs/symptoms of physical distress.       Resistance Training   Training Prescription Yes Yes Yes     Weight 4 lbs 4 lbs 4     Reps 10-15 10-15 10-15     Time 10 Minutes 10 Minutes 10 Minutes       NuStep   Level '1 1 2     ' SPM 110 110 108     Minutes '22 22 22     ' METs 2.42 2.42 2.52       Arm Ergometer   Level '1 1 2     ' RPM 47 47 56     Minutes '17 17 17     ' METs 1.43 1.43 2.08       Home Exercise Plan   Plans to continue  exercise at -- Home (comment) --     Frequency -- Add 2 additional days to program exercise sessions. --     Initial Home Exercises Provided -- 02/03/21 --              Exercise Comments:   Exercise Comments     Row Name 02/03/21 1000           Exercise Comments home exercis reviewed                Exercise Goals and Review:   Exercise Goals     Row Name 01/16/21 1006 02/03/21 1416           Exercise Goals   Increase Physical Activity Yes Yes      Intervention Provide advice, education, support and counseling about physical activity/exercise needs.;Develop an individualized exercise prescription for aerobic and resistive training based on initial evaluation findings, risk stratification, comorbidities and participant's personal goals. Provide advice, education, support and counseling about physical activity/exercise needs.;Develop an individualized exercise prescription for aerobic and resistive training based on initial evaluation findings, risk stratification, comorbidities and participant's personal goals.      Expected Outcomes Short Term: Attend rehab  on a regular basis to increase amount of physical activity.;Long Term: Add in home exercise to make exercise part of routine and to increase amount of physical activity.;Long Term: Exercising regularly at least 3-5 days a week. Short Term: Attend rehab on a regular basis to increase amount of physical activity.;Long Term: Add in home exercise to make exercise part of routine and to increase amount of physical activity.;Long Term: Exercising regularly at least 3-5 days a week.      Increase Strength and Stamina Yes Yes      Intervention Provide advice, education, support and counseling about physical activity/exercise needs.;Develop an individualized exercise prescription for aerobic and resistive training based on initial evaluation findings, risk stratification, comorbidities and participant's personal goals. Provide advice, education, support and counseling about physical activity/exercise needs.;Develop an individualized exercise prescription for aerobic and resistive training based on initial evaluation findings, risk stratification, comorbidities and participant's personal goals.      Expected Outcomes Short Term: Increase workloads from initial exercise prescription for resistance, speed, and METs.;Short Term: Perform resistance training exercises routinely during rehab and add in resistance training at home;Long Term: Improve cardiorespiratory fitness, muscular endurance and strength as measured by increased METs and functional capacity (6MWT) Short Term: Increase workloads from initial exercise prescription for resistance, speed, and METs.;Short Term: Perform resistance training exercises routinely during rehab and add in resistance training at home;Long Term: Improve cardiorespiratory fitness, muscular endurance and strength as measured by increased METs and functional capacity (6MWT)      Able to understand and use rate of perceived exertion (RPE) scale Yes Yes      Intervention Provide education and  explanation on how to use RPE scale Provide education and explanation on how to use RPE scale      Expected Outcomes Short Term: Able to use RPE daily in rehab to express subjective intensity level;Long Term:  Able to use RPE to guide intensity level when exercising independently Short Term: Able to use RPE daily in rehab to express subjective intensity level;Long Term:  Able to use RPE to guide intensity level when exercising independently      Knowledge and understanding of Target Heart Rate Range (THRR) Yes Yes      Intervention Provide education and explanation of THRR including how the numbers were predicted  and where they are located for reference Provide education and explanation of THRR including how the numbers were predicted and where they are located for reference      Expected Outcomes Short Term: Able to state/look up THRR;Short Term: Able to use daily as guideline for intensity in rehab;Long Term: Able to use THRR to govern intensity when exercising independently Short Term: Able to state/look up THRR;Short Term: Able to use daily as guideline for intensity in rehab;Long Term: Able to use THRR to govern intensity when exercising independently      Able to check pulse independently Yes Yes      Intervention Provide education and demonstration on how to check pulse in carotid and radial arteries.;Review the importance of being able to check your own pulse for safety during independent exercise Provide education and demonstration on how to check pulse in carotid and radial arteries.;Review the importance of being able to check your own pulse for safety during independent exercise      Expected Outcomes Short Term: Able to explain why pulse checking is important during independent exercise;Long Term: Able to check pulse independently and accurately Short Term: Able to explain why pulse checking is important during independent exercise;Long Term: Able to check pulse independently and accurately       Understanding of Exercise Prescription Yes Yes      Intervention Provide education, explanation, and written materials on patient's individual exercise prescription Provide education, explanation, and written materials on patient's individual exercise prescription      Expected Outcomes Short Term: Able to explain program exercise prescription;Long Term: Able to explain home exercise prescription to exercise independently Short Term: Able to explain program exercise prescription;Long Term: Able to explain home exercise prescription to exercise independently               Exercise Goals Re-Evaluation :  Exercise Goals Re-Evaluation     Row Name 02/03/21 1417             Exercise Goal Re-Evaluation   Exercise Goals Review Increase Physical Activity;Increase Strength and Stamina;Able to understand and use rate of perceived exertion (RPE) scale;Knowledge and understanding of Target Heart Rate Range (THRR);Able to check pulse independently;Understanding of Exercise Prescription       Comments Pt has completed 8 sessions of cardiac rehab. He is very motivated to [employer] and is progressing during the session. He is currently working at 2.52 METs on the stepper. Will continue to montior and progress as able.       Expected Outcomes Through exercise at rehab and at home, the patient will meet their stated goals.                 Discharge Exercise Prescription (Final Exercise Prescription Changes):  Exercise Prescription Changes - 02/03/21 1400       Response to Exercise   Blood Pressure (Admit) 130/70    Blood Pressure (Exercise) 140/70    Blood Pressure (Exit) 135/70    Heart Rate (Admit) 92 bpm    Heart Rate (Exercise) 109 bpm    Heart Rate (Exit) 101 bpm    Rating of Perceived Exertion (Exercise) 12    Duration Continue with 30 min of aerobic exercise without signs/symptoms of physical distress.    Intensity THRR unchanged      Progression   Progression Continue to  progress workloads to maintain intensity without signs/symptoms of physical distress.      Resistance Training   Training Prescription Yes    Weight  4    Reps 10-15    Time 10 Minutes      NuStep   Level 2    SPM 108    Minutes 22    METs 2.52      Arm Ergometer   Level 2    RPM 56    Minutes 17    METs 2.08             Nutrition:  Target Goals: Understanding of nutrition guidelines, daily intake of sodium <1575m, cholesterol <2071m calories 30% from fat and 7% or less from saturated fats, daily to have 5 or more servings of fruits and vegetables.  Biometrics:  Pre Biometrics - 01/16/21 1007       Pre Biometrics   Height '5\' 9"'  (1.753 m)    Weight 109.1 kg    Waist Circumference 50 inches    Hip Circumference 42 inches    Waist to Hip Ratio 1.19 %    BMI (Calculated) 35.5    Triceps Skinfold 9 mm    % Body Fat 33 %    Grip Strength 24.3 kg    Flexibility 0 in    Single Leg Stand 3 seconds              Nutrition Therapy Plan and Nutrition Goals:   Nutrition Assessments:  Nutrition Assessments - 01/16/21 0828       MEDFICTS Scores   Pre Score 15            MEDIFICTS Score Key: ?70 Need to make dietary changes  40-70 Heart Healthy Diet ? 40 Therapeutic Level Cholesterol Diet   Picture Your Plate Scores: <4<41nhealthy dietary pattern with much room for improvement. 41-50 Dietary pattern unlikely to meet recommendations for good health and room for improvement. 51-60 More healthful dietary pattern, with some room for improvement.  >60 Healthy dietary pattern, although there may be some specific behaviors that could be improved.    Nutrition Goals Re-Evaluation:   Nutrition Goals Discharge (Final Nutrition Goals Re-Evaluation):   Psychosocial: Target Goals: Acknowledge presence or absence of significant depression and/or stress, maximize coping skills, provide positive support system. Participant is able to verbalize types and ability  to use techniques and skills needed for reducing stress and depression.  Initial Review & Psychosocial Screening:  Initial Psych Review & Screening - 01/16/21 0852       Initial Review   Current issues with Current Sleep Concerns   takes OTC meds for his sleep, he reports it helps sometimes and sometimes it does not     [place]    Comments He has a good support system. His wife, son, daughter in law, and grandchildren all support him. His mother, father, brother, [place] and visits them.      Barriers   Psychosocial barriers to participate in program The patient should benefit from training in stress management and relaxation.      Screening Interventions   Interventions Encouraged to exercise;To provide support and resources with identified psychosocial needs    Expected Outcomes Long Term Goal: Stressors or current issues are controlled or eliminated.;Short Term goal: Identification and review with participant of any Quality of Life or Depression concerns found by scoring the questionnaire.;Long Term goal: The participant improves quality of Life and PHQ9 Scores as seen by post scores and/or verbalization of changes  Quality of Life Scores:  Quality of Life - 01/16/21 1008       Quality of Life   Select Quality of Life      Quality of Life Scores   Health/Function Pre 21 %    Socioeconomic Pre 29.25 %    Psych/Spiritual Pre 24 %    Family Pre 28.8 %    GLOBAL Pre 24.71 %            Scores of 19 and below usually indicate a poorer quality of life in these areas.  A difference of  2-3 points is a clinically meaningful difference.  A difference of 2-3 points in the total score of the Quality of Life Index has been associated with significant improvement in overall quality of life, self-image, physical symptoms, and general health in studies assessing change in quality of  life.  PHQ-9: Recent Review Flowsheet Data     Depression screen [facility] 2/9 01/16/2021 11/06/2020 09/17/2020 08/05/2020 07/22/2020   Decreased Interest 0 0 0 0 0   Down, Depressed, Hopeless 0 0 0 0 0   PHQ - 2 Score 0 0 0 0 0   Altered sleeping 1 3 - - -   Tired, decreased energy 1 2 - - -   Change in appetite 1 1 - - -   Feeling bad or failure about yourself  0 0 - - -   Trouble concentrating 0 0 - - -   Moving slowly or fidgety/restless 0 0 - - -   Suicidal thoughts 0 0 - - -   PHQ-9 Score 3 6 - - -   Difficult doing work/chores Not difficult at all Not difficult at all - - -      Interpretation of Total Score  Total Score Depression Severity:  1-4 = Minimal depression, 5-9 = Mild depression, 10-14 = Moderate depression, 15-19 = Moderately severe depression, 20-27 = Severe depression   Psychosocial Evaluation and Intervention:  Psychosocial Evaluation - 01/16/21 0911       Psychosocial Evaluation & Interventions   Interventions Encouraged to exercise with the program and follow exercise prescription;Stress management education;Relaxation education    Comments Pt has no barriers to participating in cardiac rehab. This is his third time in the program in the past year. The first two times he was discharged early due to chest pain and subsequent interventions were performed (stent the first time and CABG x4 the second time). He still does not sleep well. He takes OTC medications for this and reports that it helps occasionally. He has been walking almost everyday and reports that his energy is coming back and he no longer has chest pain when exerting heimself. He scored a 3 on his PHQ-9 and this relates to sleep and energy. He copes well on his own and has a good support system with his wife, son, daughter in law, and grand daughter. He has a positive outlook on rehab and jokes that he will actually complete the program this time. 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 His goals are to lose some weight and to continue to increase his stamina.    Expected Outcomes Pt will continue to not have any identifiable psychosocial issues.    Continue Psychosocial Services  No Follow up required             Psychosocial Re-Evaluation:  Psychosocial Re-Evaluation  [employer] Name 01/29/21 1313             Psychosocial Re-Evaluation   Current issues with Current Sleep Concerns       Comments Patient has been in the program before. This is his third time. He has completed 6 sessions and continues to have no psychosocial barriers identified. He enjoys coming to the program and says he is hopeful he is "fixed" now. He demonstrates an interest in improving his health. We will continue to montior.       Expected Outcomes Patient will continue to have no psychosocial barriers identified.       Interventions Encouraged to attend Cardiac Rehabilitation for the exercise;Stress management education;Relaxation education       Continue Psychosocial Services  No Follow up required                Psychosocial Discharge (Final Psychosocial Re-Evaluation):  Psychosocial Re-Evaluation - 01/29/21 1313       Psychosocial Re-Evaluation   Current issues with Current Sleep Concerns    Comments Patient has been in the program before. This is his third time. He has completed 6 sessions and continues to have no psychosocial barriers identified. He enjoys coming to the program and says he is hopeful he is "fixed" now. He demonstrates an interest in improving his health. We will continue to montior.    Expected Outcomes Patient will continue to have no psychosocial barriers identified.    Interventions Encouraged to attend Cardiac Rehabilitation for the exercise;Stress management education;Relaxation education    Continue Psychosocial Services  No Follow up required             Vocational Rehabilitation: Provide  vocational rehab assistance to qualifying candidates.   Vocational Rehab Evaluation & Intervention:  Vocational Rehab - 01/16/21 7494       Initial Vocational Rehab Evaluation & Intervention   Assessment shows need for Vocational Rehabilitation No      Vocational Rehab Re-Evaulation   Comments Patient is retired and has no desire to return back to a job with any significant physical requirements.             Education: Education Goals: Education classes will be provided on a weekly basis, covering required topics. Participant will state understanding/return demonstration of topics presented.  Learning Barriers/Preferences:  Learning Barriers/Preferences - 01/16/21 0829       Learning Barriers/Preferences   Learning Barriers None    Learning Preferences Audio;Verbal Instruction             Education Topics: Hypertension, Hypertension Reduction -Define heart disease and high blood pressure. Discus how high blood pressure affects the body and ways to reduce high blood pressure. Flowsheet Row CARDIAC REHAB PHASE II EXERCISE from 07/31/2020 in [place]  Date 07/03/20  Educator mk  Instruction Review Code 2- Demonstrated Understanding       Exercise and Your Heart -Discuss why it is important to exercise, the FITT principles of exercise, normal and abnormal responses to exercise, and how to exercise safely. Flowsheet Row CARDIAC REHAB PHASE II EXERCISE from 07/31/2020 in [place]  Date 07/10/20  Educator mk  Instruction Review Code 2- Demonstrated Understanding       Angina -Discuss definition of angina, causes of angina, treatment of angina, and how to decrease risk of having angina. Flowsheet Row CARDIAC REHAB PHASE II EXERCISE from 07/31/2020 in [place]  Date 07/17/20  Educator [facility]  Instruction Review Code 2- Demonstrated  Understanding       Cardiac Medications -Review what the  following cardiac medications are used for, how they affect the body, and side effects that may occur when taking the medications.  Medications include Aspirin, Beta blockers, calcium channel blockers, ACE Inhibitors, angiotensin receptor blockers, diuretics, digoxin, and antihyperlipidemics. Flowsheet Row CARDIAC REHAB PHASE II EXERCISE from 01/29/2021 in [place]  Date 01/22/21  Educator DF  Instruction Review Code 2- Demonstrated Understanding       Congestive Heart Failure -Discuss the definition of CHF, how to live with CHF, the signs and symptoms of CHF, and how keep track of weight and sodium intake. Flowsheet Row CARDIAC REHAB PHASE II EXERCISE from 01/29/2021 in [place]  Date 01/29/21  Educator DJ  Instruction Review Code 1- Verbalizes Understanding       Heart Disease and Intimacy -Discus the effect sexual activity has on the heart, how changes occur during intimacy as we age, and safety during sexual activity. Flowsheet Row CARDIAC REHAB PHASE II EXERCISE from 07/31/2020 in [place]  Date 05/08/20  Educator [facility]  Instruction Review Code 2- Demonstrated Understanding       Smoking Cessation / COPD -Discuss different methods to quit smoking, the health benefits of quitting smoking, and the definition of COPD.   Nutrition I: Fats -Discuss the types of cholesterol, what cholesterol does to the heart, and how cholesterol levels can be controlled.   Nutrition II: Labels -Discuss the different components of food labels and how to read food label   Heart Parts/Heart Disease and PAD -Discuss the anatomy of the heart, the pathway of blood circulation through the heart, and these are affected by heart disease.   Stress I: Signs and Symptoms -Discuss the causes of stress, how stress may lead to anxiety and depression, and ways to limit stress.   Stress II: Relaxation -Discuss different types of  relaxation techniques to limit stress. Flowsheet Row CARDIAC REHAB PHASE II EXERCISE from 07/31/2020 in [place]  Date 06/19/20  Educator mk  Instruction Review Code 2- Demonstrated Understanding       Warning Signs of Stroke / TIA -Discuss definition of a stroke, what the signs and symptoms are of a stroke, and how to identify when someone is having stroke. Flowsheet Row CARDIAC REHAB PHASE II EXERCISE from 07/31/2020 in [place]  Date 06/26/20  Educator mk  Instruction Review Code 2- Demonstrated Understanding       Knowledge Questionnaire Score:  Knowledge Questionnaire Score - 01/16/21 2831       Knowledge Questionnaire Score   Pre Score 22/24             Core Components/Risk Factors/Patient Goals at Admission:  Personal Goals and Risk Factors at Admission - 01/16/21 0856       Core Components/Risk Factors/Patient Goals on Admission    Weight Management Yes    Intervention Weight Management: Develop a combined nutrition and exercise program designed to reach desired caloric intake, while maintaining appropriate intake of nutrient and fiber, sodium and fats, and appropriate energy expenditure required for the weight goal.;Weight Management: Provide education and appropriate resources to help participant work on and attain dietary goals.;Weight Management/Obesity: Establish reasonable short term and long term weight goals.;Obesity: Provide education and appropriate resources to help participant work on and attain dietary goals.    Expected Outcomes Short Term: Continue to assess and modify interventions until short term weight is achieved;Long Term: Adherence  to nutrition and physical activity/exercise program aimed toward attainment of established weight goal;Weight Loss: Understanding of general recommendations for a balanced deficit meal plan, which promotes 1-2 lb weight loss per week and includes a negative energy balance of  [phone] kcal/d;Understanding recommendations for meals to include 15-35% energy as protein, 25-35% energy from fat, 35-60% energy from carbohydrates, less than 265m of dietary cholesterol, 20-35 gm of total fiber daily;Understanding of distribution of calorie intake throughout the day with the consumption of 4-5 meals/snacks    Improve shortness of breath with ADL's Yes    Intervention Provide education, individualized exercise plan and daily activity instruction to help decrease symptoms of SOB with activities of daily living.    Expected Outcomes Short Term: Improve cardiorespiratory fitness to achieve a reduction of symptoms when performing ADLs;Long Term: Be able to perform more ADLs without symptoms or delay the onset of symptoms    Diabetes Yes    Intervention Provide education about signs/symptoms and action to take for hypo/hyperglycemia.;Provide education about proper nutrition, including hydration, and aerobic/resistive exercise prescription along with prescribed medications to achieve blood glucose in normal ranges: Fasting glucose 65-99 mg/dL    Expected Outcomes Short Term: Participant verbalizes understanding of the signs/symptoms and immediate care of hyper/hypoglycemia, proper foot care and importance of medication, aerobic/resistive exercise and nutrition plan for blood glucose control.;Long Term: Attainment of HbA1C < 7%.             Core Components/Risk Factors/Patient Goals Review:   Goals and Risk Factor Review     Row Name 01/29/21 1317             Core Components/Risk Factors/Patient Goals Review   Personal Goals Review Weight Management/Obesity;Other;Diabetes       Review Patient was referred to [facility] for the 3Rd time with CABGx4. He has multiple risk factors for CAD and is participating in the program for risk modification. He has completed 6 sessions and is doing well. His personal for the program are to lose weight; continue to increase his stamina and energy  levels. He last A1C was 08/16/20 at 8.2% trending up from 11 months ago. He had an episode of hypoglycemia today after exercise. He blood pressure is well controlled. We will continue to monitor his progress as he works towards meeting these goals.       Expected Outcomes Patient will complete the program meeting both personal and program goals.                Core Components/Risk Factors/Patient Goals at Discharge (Final Review):   Goals and Risk Factor Review - 01/29/21 1317       Core Components/Risk Factors/Patient Goals Review   Personal Goals Review Weight Management/Obesity;Other;Diabetes    Review Patient was referred to [facility] for the 3Rd time with CABGx4. He has multiple risk factors for CAD and is participating in the program for risk modification. He has completed 6 sessions and is doing well. His personal for the program are to lose weight; continue to increase his stamina and energy levels. He last A1C was 08/16/20 at 8.2% trending up from 11 months ago. He had an episode of hypoglycemia today after exercise. He blood pressure is well controlled. We will continue to monitor his progress as he works towards meeting these goals.    Expected Outcomes Patient will complete the program meeting both personal and program goals.             ITP Comments:   Comments: ITP REVIEW  Pt is making expected progress toward Cardiac Rehab goals after completing 8 sessions. Recommend continued exercise, life style modification, education, and increased stamina and strength.

## 2021-02-06 ENCOUNTER — Encounter: Payer: Self-pay | Admitting: Nurse Practitioner

## 2021-02-06 ENCOUNTER — Ambulatory Visit: Payer: Medicare PPO | Admitting: Nurse Practitioner

## 2021-02-06 ENCOUNTER — Other Ambulatory Visit: Payer: Self-pay

## 2021-02-06 VITALS — BP 127/80 | HR 93 | Temp 98.4°F | Resp 20 | Ht 69.0 in | Wt 243.0 lb

## 2021-02-06 DIAGNOSIS — Z6836 Body mass index (BMI) 36.0-36.9, adult: Secondary | ICD-10-CM | POA: Diagnosis not present

## 2021-02-06 DIAGNOSIS — E785 Hyperlipidemia, unspecified: Secondary | ICD-10-CM | POA: Diagnosis not present

## 2021-02-06 DIAGNOSIS — K219 Gastro-esophageal reflux disease without esophagitis: Secondary | ICD-10-CM

## 2021-02-06 DIAGNOSIS — I25110 Atherosclerotic heart disease of native coronary artery with unstable angina pectoris: Secondary | ICD-10-CM

## 2021-02-06 DIAGNOSIS — E1169 Type 2 diabetes mellitus with other specified complication: Secondary | ICD-10-CM | POA: Diagnosis not present

## 2021-02-06 DIAGNOSIS — Z23 Encounter for immunization: Secondary | ICD-10-CM

## 2021-02-06 DIAGNOSIS — I252 Old myocardial infarction: Secondary | ICD-10-CM

## 2021-02-06 DIAGNOSIS — I10 Essential (primary) hypertension: Secondary | ICD-10-CM | POA: Diagnosis not present

## 2021-02-06 DIAGNOSIS — F5101 Primary insomnia: Secondary | ICD-10-CM

## 2021-02-06 DIAGNOSIS — N401 Enlarged prostate with lower urinary tract symptoms: Secondary | ICD-10-CM

## 2021-02-06 DIAGNOSIS — Z794 Long term (current) use of insulin: Secondary | ICD-10-CM | POA: Diagnosis not present

## 2021-02-06 DIAGNOSIS — E118 Type 2 diabetes mellitus with unspecified complications: Secondary | ICD-10-CM | POA: Diagnosis not present

## 2021-02-06 DIAGNOSIS — R3911 Hesitancy of micturition: Secondary | ICD-10-CM

## 2021-02-06 LAB — BAYER DCA HB A1C WAIVED: HB A1C (BAYER DCA - WAIVED): 7.1 % — ABNORMAL HIGH (ref 4.8–5.6)

## 2021-02-06 MED ORDER — MIRTAZAPINE 15 MG PO TABS: 15.0000 mg | ORAL_TABLET | Freq: Every day | ORAL | 2 refills | Status: DC

## 2021-02-06 NOTE — Patient Instructions (Signed)

## 2021-02-06 NOTE — Addendum Note (Signed)
Addended by: Rolena Infante on: 02/06/2021 04:43 PM   Modules accepted: Orders

## 2021-02-06 NOTE — Progress Notes (Addendum)
Subjective:    Patient ID: [name] [name], male    DOB: May 22, 1955, 65 y.o.   MRN: [id number]   Chief Complaint: medical management of chronic issues     HPI:  1. Essential hypertension, benign No c/o chest pain, sob or headache. Doe snot check blood pressure at home. BP Readings from Last 3 Encounters:  01/16/21 128/62  12/18/20 138/76  11/27/20 132/76     2. Coronary artery disease involving native coronary artery of native heart with unstable angina pectoris [facility]) Last saw cardiology 12/18/20. Reviewing office note, no changes were made to plan of care.  3. Hx of non-ST elevation myocardial infarction (NSTEMI) He has had several stent placements and eventually CABG in the lat year. He is currently in cardiac rehab and that has been going well without chest pain.  4. Hyperlipidemia associated with type 2 diabetes mellitus ([place]) Does tryto watch diet and has been exercising in cardiac rehab.   5. Type 2 diabetes mellitus with complication, with long-term current use of insulin (HCC) Fasting blood sugars are running around 120-200. Usually lower range but has had highs. Blood sugar dropped while he was at cardiac rehab. Was 20. Once het got it back up he felt fine. Lab Results  Component Value Date   HGBA1C 6.9 11/06/2020     6. Gastroesophageal reflux disease without esophagitis I son protonix daily and works well.  7. Benign prostatic hyperplasia with urinary hesitancy No problems voiding Lab Results  Component Value Date   PSA1 2.4 08/05/2020      8. BMI 36.0-36.9,adult No recent weight changes Wt Readings from Last 3 Encounters:  02/06/21 243 lb (110.2 kg)  02/03/21 245 lb 2.4 oz (111.2 kg)  01/20/21 240 lb 8.4 oz (109.1 kg)   BMI Readings from Last 3 Encounters:  02/06/21 35.88 kg/m  02/03/21 36.20 kg/m  01/20/21 35.52 kg/m       Outpatient Encounter Medications as of 02/06/2021  Medication Sig   Accu-Chek Softclix Lancets lancets USE TO  CHECK BLOOD SUGAR 4 TIMES DAILY. Dx E11.8   aspirin EC 325 MG EC tablet Take 1 tablet (325 mg total) by mouth daily.   atorvastatin (LIPITOR) 80 MG tablet Take 1 tablet (80 mg total) by mouth daily.   blood glucose meter kit and supplies Dispense based on patient and insurance preference. Use up to four times daily as directed. (FOR ICD-10 E10.9, E11.9).   carvedilol (COREG) 12.5 MG tablet Take 1.5 tablets (18.75 mg total) by mouth 2 (two) times daily with a meal.   cetirizine (ZYRTEC) 10 MG tablet Take 10 mg by mouth daily.   CINNAMON PO Take 1 tablet by mouth daily.   CRANBERRY PO Take 1 tablet by mouth 2 (two) times daily.   furosemide (LASIX) 20 MG tablet Take 20 mg daily as needed for leg swelling   GLOBAL EASE INJECT PEN NEEDLES 31G X 8 MM MISC Use up to 8 times a day Dx E11.8   glucose blood (ACCU-CHEK AVIVA PLUS) test strip Check BS up to 4 times a day dx E11.8   Insulin Aspart FlexPen (NOVOLOG) 100 UNIT/ML INJECT 45-75 UNITS AT BREAKFAST AND DINNER.   insulin glargine (LANTUS SOLOSTAR) 100 UNIT/ML Solostar Pen 44u in morning and 75 at night (Patient taking differently: Inject 44-75 Units into the skin See admin instructions. Inject 44 units in morning and 75 units at night.)   isosorbide mononitrate (IMDUR) 30 MG 24 hr tablet Take 0.5 tablets (15 mg total) by  mouth every evening.   lisinopril (ZESTRIL) 2.5 MG tablet Take 1 tablet (2.5 mg total) by mouth daily.   metFORMIN (GLUCOPHAGE) 500 MG tablet TAKE 2 TABLETS BY MOUTH TWICE DAILY.   pantoprazole (PROTONIX) 40 MG tablet Take 1 tablet (40 mg total) by mouth daily as needed (for acid reflux).   Semaglutide,0.25 or 0.5MG/DOS, (OZEMPIC, 0.25 OR 0.5 MG/DOSE,) 2 MG/1.5ML SOPN Inject 0.5 mg into the skin once a week.   sulfamethoxazole-trimethoprim (BACTRIM DS) 800-160 MG tablet Take 1 tablet by mouth 2 (two) times daily. (Patient not taking: Reported on 01/13/2021)   tamsulosin (FLOMAX) 0.4 MG CAPS capsule Take 1 capsule (0.4 mg total) by  mouth at bedtime.   traMADol (ULTRAM) 50 MG tablet Take 1 tablet (50 mg total) by mouth every 6 (six) hours as needed for severe pain.   No facility-administered encounter medications on file as of 02/06/2021.    Past Surgical History:  Procedure Laterality Date   CARDIAC CATHETERIZATION  2012   CAROTID STENT     stents x 2    CHOLECYSTECTOMY N/A 12/04/2015   Procedure: LAPAROSCOPIC CHOLECYSTECTOMY;  Surgeon: Aviva Signs, MD;  Location: [facility];  Service: General;  Laterality: N/A;   CHOLECYSTECTOMY, LAPAROSCOPIC  12/05/2015   CORONARY ANGIOPLASTY  2012   STENT X 1 2012, STENT X 1 YRS BEFORE   CORONARY ARTERY BYPASS GRAFT N/A 08/21/2020   Procedure: CORONARY ARTERY BYPASS GRAFTING (CABG) X 4, USING LEFT INTERNAL MAMMARY ARTERY AND RIGHT GREATER SAPHENOUS VEIN HARVESTED ENDOSCOPICALLY. SVG TO OM1, OM3 SEQUENTIALLY, SVG TO DIAG., LIMA TO LAD;  Surgeon: Melrose Nakayama, MD;  Location: [facility];  Service: Open Heart Surgery;  Laterality: N/A;   CORONARY ATHERECTOMY N/A 02/09/2020   Procedure: CORONARY ATHERECTOMY;  Surgeon: Wellington Hampshire, MD;  Location: [place] [facility];  Service: Cardiovascular;  Laterality: N/A;   CORONARY STENT INTERVENTION N/A 02/09/2020   Procedure: CORONARY STENT INTERVENTION;  Surgeon: Wellington Hampshire, MD;  Location: [place] CV LAB;  Service: Cardiovascular;  Laterality: N/A;   CORONARY STENT INTERVENTION N/A 05/22/2020   Procedure: CORONARY STENT INTERVENTION;  Surgeon: Jettie Booze, MD;  Location: [place] CV LAB;  Service: Cardiovascular;  Laterality: N/A;   ENDOVEIN HARVEST OF GREATER SAPHENOUS VEIN Right 08/21/2020   Procedure: ENDOVEIN HARVEST OF GREATER SAPHENOUS VEIN;  Surgeon: Melrose Nakayama, MD;  Location: [place];  Service: Open Heart Surgery;  Laterality: Right;   HYDROCELE EXCISION Bilateral 06/02/2017   Procedure: HYDROCELECTOMY ADULT;  Surgeon: Ceasar Mons, MD;  Location: [facility];  Service: Urology;  Laterality:  Bilateral;  ONLY NEEDS 45 MIN   INGUINAL HERNIA REPAIR     RIGHT GROIN   INTRAVASCULAR IMAGING/OCT N/A 05/22/2020   Procedure: INTRAVASCULAR IMAGING/OCT;  Surgeon: Jettie Booze, MD;  Location: [place] CV LAB;  Service: Cardiovascular;  Laterality: N/A;   INTRAVASCULAR ULTRASOUND/IVUS N/A 02/09/2020   Procedure: Intravascular Ultrasound/IVUS;  Surgeon: Wellington Hampshire, MD;  Location: [place] CV LAB;  Service: Cardiovascular;  Laterality: N/A;   KNEE ARTHROSCOPY Left 05/23/2019   Procedure: LEFT KNEE ARTHROSCOPY AND DEBRIDEMENT PARTIAL MEDIAL MENISECTOMY;  Surgeon: Newt Minion, MD;  Location: [place];  Service: Orthopedics;  Laterality: Left;   LEFT HEART CATH AND CORONARY ANGIOGRAPHY N/A 02/07/2020   Procedure: LEFT HEART CATH AND CORONARY ANGIOGRAPHY;  Surgeon: Leonie Man, MD;  Location: [place] CV LAB;  Service: Cardiovascular;  Laterality: N/A;   LEFT HEART CATH AND CORONARY ANGIOGRAPHY N/A 05/22/2020  Procedure: LEFT HEART CATH AND CORONARY ANGIOGRAPHY;  Surgeon: Jettie Booze, MD;  Location: [place] CV LAB;  Service: Cardiovascular;  Laterality: N/A;   LEFT HEART CATH AND CORONARY ANGIOGRAPHY N/A 08/15/2020   Procedure: LEFT HEART CATH AND CORONARY ANGIOGRAPHY;  Surgeon: Lorretta Harp, MD;  Location: [place] CV LAB;  Service: Cardiovascular;  Laterality: N/A;   TEE WITHOUT CARDIOVERSION N/A 08/21/2020   Procedure: TRANSESOPHAGEAL ECHOCARDIOGRAM (TEE);  Surgeon: Melrose Nakayama, MD;  Location: [place];  Service: Open Heart Surgery;  Laterality: N/A;   TRIGGER FINGER RELEASE Right 01/08/2015   Procedure: RELEASE TRIGGER FINGER/A-1 PULLEY RIGHT MIDDLE FINGER;  Surgeon: Daryll Brod, MD;  Location: [place];  Service: Orthopedics;  Laterality: Right;    Family History  Problem Relation Age of Onset   Hyperlipidemia Mother    Hypertension Mother    Hyperlipidemia Father    Hypertension Father    Diabetes  Father    Dementia Father    Diabetes Maternal Grandfather     New complaints: Trouble sleeping only sleeps abut 4 hours at a time.  Social history: Lives with wife  Controlled substance contract: n/a     Review of Systems  Constitutional:  Negative for diaphoresis.  Eyes:  Negative for pain.  Respiratory:  Negative for shortness of breath.   Cardiovascular:  Negative for chest pain, palpitations and leg swelling.  Gastrointestinal:  Negative for abdominal pain.  Endocrine: Negative for polydipsia.  Skin:  Negative for rash.  Neurological:  Negative for dizziness, weakness and headaches.  Hematological:  Does not bruise/bleed easily.  All other systems reviewed and are negative.     Objective:   Physical Exam Vitals and nursing note reviewed.  Constitutional:      Appearance: Normal appearance. He is well-developed.  HENT:     Head: Normocephalic.     Nose: Nose normal.  Eyes:     Pupils: Pupils are equal, round, and reactive to light.  Neck:     Thyroid: No thyroid mass or thyromegaly.     Vascular: No carotid bruit or JVD.     Trachea: Phonation normal.  Cardiovascular:     Rate and Rhythm: Normal rate and regular rhythm.  Pulmonary:     Effort: Pulmonary effort is normal. No respiratory distress.     Breath sounds: Normal breath sounds.  Abdominal:     General: Bowel sounds are normal.     Palpations: Abdomen is soft.     Tenderness: There is no abdominal tenderness.  Musculoskeletal:        General: Normal range of motion.     Cervical back: Normal range of motion and neck supple.  Lymphadenopathy:     Cervical: No cervical adenopathy.  Skin:    General: Skin is warm and dry.  Neurological:     Mental Status: He is alert and oriented to person, place, and time.  Psychiatric:        Behavior: Behavior normal.        Thought Content: Thought content normal.        Judgment: Judgment normal.    BP 127/80   Pulse 93   Temp 98.4 F (36.9 C)  (Temporal)   Resp 20   Ht '5\' 9"'  (1.753 m)   Wt 243 lb (110.2 kg)   SpO2 97%   BMI 35.88 kg/m   Hgba1c       Assessment & Plan:   [name] [name] comes in today with  chief complaint of Medical Management of Chronic Issues   Diagnosis and orders addressed:  1. Essential hypertension, benign Low sodium diet - CBC with Differential/Platelet - CMP14+EGFR  2. Coronary artery disease involving native coronary artery of native heart with unstable angina pectoris [facility]) Keep follow up with cardiology  3. Hx of non-ST elevation myocardial infarction (NSTEMI) Report any chest pain  4. Hyperlipidemia associated with type 2 diabetes mellitus (HCC) Low fat diet - Lipid panel  5. Type 2 diabetes mellitus with complication, with long-term current use of insulin (HCC) Continue to watch carbs in diet - Bayer DCA Hb A1c Waived - Microalbumin / creatinine urine ratio  6. Gastroesophageal reflux disease without esophagitis Avoid spicy foods Do not eat 2 hours prior to bedtime  7. Benign prostatic hyperplasia with urinary hesitancy report any voiding problems  8. BMI 36.0-36.9,adult Discussed diet and exercise for person with BMI >25 Will recheck weight in 3-6 months  9. insomnia Add remeron qhs patient will let me know if helps  Labs pending Health Maintenance reviewed Diet and exercise encouraged  Follow up plan: 3 months   [place], FNP

## 2021-02-06 NOTE — Addendum Note (Signed)
Addended by: Chevis Pretty on: 02/06/2021 03:12 PM   Modules accepted: Orders

## 2021-02-07 ENCOUNTER — Encounter (HOSPITAL_COMMUNITY): Payer: Medicare PPO

## 2021-02-07 LAB — LIPID PANEL
Chol/HDL Ratio: 4.1 ratio (ref 0.0–5.0)
Cholesterol, Total: 86 mg/dL — ABNORMAL LOW (ref 100–199)
HDL: 21 mg/dL — ABNORMAL LOW (ref >39)
LDL Chol Calc (NIH): 39 mg/dL (ref 0–99)
Triglycerides: 150 mg/dL — ABNORMAL HIGH (ref 0–149)
VLDL Cholesterol Cal: 26 mg/dL (ref 5–40)

## 2021-02-07 LAB — CBC WITH DIFFERENTIAL/PLATELET
Basophils Absolute: 0 10*3/uL (ref 0.0–0.2)
Basos: 0 %
EOS (ABSOLUTE): 0.2 10*3/uL (ref 0.0–0.4)
Eos: 4 %
Hematocrit: 39.5 % (ref 37.5–51.0)
Hemoglobin: 12.3 g/dL — ABNORMAL LOW (ref 13.0–17.7)
Immature Grans (Abs): 0 10*3/uL (ref 0.0–0.1)
Immature Granulocytes: 0 %
Lymphocytes Absolute: 1.7 10*3/uL (ref 0.7–3.1)
Lymphs: 31 %
MCH: 24.1 pg — ABNORMAL LOW (ref 26.6–33.0)
MCHC: 31.1 g/dL — ABNORMAL LOW (ref 31.5–35.7)
MCV: 77 fL — ABNORMAL LOW (ref 79–97)
Monocytes Absolute: 0.7 10*3/uL (ref 0.1–0.9)
Monocytes: 13 %
Neutrophils Absolute: 2.9 10*3/uL (ref 1.4–7.0)
Neutrophils: 52 %
Platelets: 146 10*3/uL — ABNORMAL LOW (ref 150–450)
RBC: 5.11 x10E6/uL (ref 4.14–5.80)
RDW: 16.4 % — ABNORMAL HIGH (ref 11.6–15.4)
WBC: 5.5 10*3/uL (ref 3.4–10.8)

## 2021-02-07 LAB — CMP14+EGFR
ALT: 20 IU/L (ref 0–44)
AST: 20 IU/L (ref 0–40)
Albumin/Globulin Ratio: 1.8 (ref 1.2–2.2)
Albumin: 4.4 g/dL (ref 3.8–4.8)
Alkaline Phosphatase: 77 IU/L (ref 44–121)
BUN/Creatinine Ratio: 19 (ref 10–24)
BUN: 19 mg/dL (ref 8–27)
Bilirubin Total: 0.4 mg/dL (ref 0.0–1.2)
CO2: 20 mmol/L (ref 20–29)
Calcium: 9 mg/dL (ref 8.6–10.2)
Chloride: 99 mmol/L (ref 96–106)
Creatinine, Ser: 0.98 mg/dL (ref 0.76–1.27)
Globulin, Total: 2.5 g/dL (ref 1.5–4.5)
Glucose: 229 mg/dL — ABNORMAL HIGH (ref 70–99)
Potassium: 4.9 mmol/L (ref 3.5–5.2)
Sodium: 137 mmol/L (ref 134–144)
Total Protein: 6.9 g/dL (ref 6.0–8.5)
eGFR: 85 mL/min/{1.73_m2} (ref >59)

## 2021-02-07 LAB — MICROALBUMIN / CREATININE URINE RATIO
Creatinine, Urine: 45.2 mg/dL
Microalb/Creat Ratio: 26 mg/g creat (ref 0–29)
Microalbumin, Urine: 11.8 ug/mL

## 2021-02-10 ENCOUNTER — Encounter (HOSPITAL_COMMUNITY)
Admission: RE | Admit: 2021-02-10 | Discharge: 2021-02-10 | Disposition: A | Discharge status: Home / Self Care | Payer: Medicare PPO | Source: Ambulatory Visit | Attending: Cardiology | Admitting: Cardiology

## 2021-02-10 DIAGNOSIS — I214 Non-ST elevation (NSTEMI) myocardial infarction: Secondary | ICD-10-CM | POA: Diagnosis not present

## 2021-02-10 DIAGNOSIS — Z951 Presence of aortocoronary bypass graft: Secondary | ICD-10-CM | POA: Diagnosis not present

## 2021-02-10 NOTE — Progress Notes (Signed)
Daily Session Note  Patient Details  Name: ZYKEEM BAUSERMAN MRN: [id number] Date of Birth: Jun 17, 1955 Referring Provider:   Flowsheet Row CARDIAC REHAB PHASE II ORIENTATION from 01/16/2021 in [place]  Referring Provider Dr. Domenic Polite       Encounter Date: 02/10/2021  Check In:  Session Check In - 02/10/21 0930       Check-In   Supervising physician immediately available to respond to emergencies [facility] MD immediately available    Physician(s) Dr. Domenic Polite    Location [facility]    Staff Present Redge Gainer, BS, Exercise Physiologist;Debra Wynetta Emery, RN, BSN    Virtual Visit No    Medication changes reported     No    Fall or balance concerns reported    No    Tobacco Cessation No Change    Warm-up and Cool-down Performed as group-led instruction    Resistance Training Performed Yes    VAD Patient? No    PAD/SET Patient? No      Pain Assessment   Currently in Pain? No/denies    Pain Score 0-No pain    Multiple Pain Sites No             Capillary Blood Glucose: No results found for this or any previous visit (from the past 24 hour(s)).    Social History   Tobacco Use  Smoking Status Never  Smokeless Tobacco Never    Goals Met:  Independence with exercise equipment Exercise tolerated well No report of concerns or symptoms today Strength training completed today  Goals Unmet:  Not Applicable  Comments: check out 1030   Dr. Kathie Dike is Medical Director for [facility] Pulmonary Rehab.

## 2021-02-12 ENCOUNTER — Encounter (HOSPITAL_COMMUNITY)
Admission: RE | Admit: 2021-02-12 | Discharge: 2021-02-12 | Disposition: A | Discharge status: Home / Self Care | Payer: Medicare PPO | Source: Ambulatory Visit | Attending: Cardiology | Admitting: Cardiology

## 2021-02-12 DIAGNOSIS — I214 Non-ST elevation (NSTEMI) myocardial infarction: Secondary | ICD-10-CM | POA: Diagnosis not present

## 2021-02-12 DIAGNOSIS — Z951 Presence of aortocoronary bypass graft: Secondary | ICD-10-CM | POA: Diagnosis not present

## 2021-02-12 NOTE — Progress Notes (Signed)
Daily Session Note  Patient Details  Name: [name] [name] MRN: [id number] Date of Birth: [birth year] Referring Provider:   Flowsheet Row CARDIAC REHAB PHASE II ORIENTATION from 01/16/2021 in [place]  Referring Provider Dr. Domenic Polite       Encounter Date: 02/12/2021  Check In:  Session Check In - 02/12/21 0930       Check-In   Supervising physician immediately available to respond to emergencies [facility] MD immediately available    Physician(s) Dr. Domenic Polite    Location [facility]    Staff Present Aundra Dubin, RN, BSN;Heather Otho Ket, BS, Exercise Physiologist    Virtual Visit No    Medication changes reported     No    Fall or balance concerns reported    No    Tobacco Cessation No Change    Warm-up and Cool-down Performed as group-led instruction    Resistance Training Performed Yes    VAD Patient? No    PAD/SET Patient? No      Pain Assessment   Currently in Pain? No/denies    Pain Score 0-No pain    Multiple Pain Sites No             Capillary Blood Glucose: No results found for this or any previous visit (from the past 24 hour(s)).    Social History   Tobacco Use  Smoking Status Never  Smokeless Tobacco Never    Goals Met:  Independence with exercise equipment Exercise tolerated well No report of concerns or symptoms today Strength training completed today  Goals Unmet:  Not Applicable  Comments: Check out 1030.   Dr. Kathie Dike is Medical Director for [facility] Pulmonary Rehab.

## 2021-02-12 NOTE — Progress Notes (Signed)
Daily Session Note  Patient Details  Name: [name] [name] MRN: [id number] Date of Birth: 01/12/1956 Referring Provider:   Flowsheet Row CARDIAC REHAB PHASE II ORIENTATION from 01/16/2021 in [place] CARDIAC RE[facility]ILITATION  Referring Provider Dr. McDowell       Encounter Date: 02/12/2021  Check In:  Session Check In - 02/12/21 0930       Check-In   Supervising physician immediately available to respond to emergencies [facility] MD immediately available    Physician(s) Dr. McDowell    Location [facility]    Staff Present Jasani Dolney, RN, BSN;Heather [place], BS, Exercise Physiologist    Virtual Visit No    Medication changes reported     No    Fall or balance concerns reported    No    Tobacco Cessation No Change    Warm-up and Cool-down Performed as group-led instruction    Resistance Training Performed Yes    VAD Patient? No    PAD/SET Patient? No      Pain Assessment   Currently in Pain? No/denies    Pain Score 0-No pain    Multiple Pain Sites No             Capillary Blood Glucose: No results found for this or any previous visit (from the past 24 hour(s)).    Social History   Tobacco Use  Smoking Status Never  Smokeless Tobacco Never    Goals Met:  Independence with exercise equipment Exercise tolerated well No report of concerns or symptoms today Strength training completed today  Goals Unmet:  Not Applicable  Comments: Check out 1030.   Dr. Jehanzeb Memon is Medical Director for [place] [facility]. 

## 2021-02-14 ENCOUNTER — Encounter (HOSPITAL_COMMUNITY): Payer: Medicare PPO

## 2021-02-17 ENCOUNTER — Encounter (HOSPITAL_COMMUNITY): Payer: Medicare PPO

## 2021-02-19 ENCOUNTER — Encounter (HOSPITAL_COMMUNITY)
Admission: RE | Admit: 2021-02-19 | Discharge: 2021-02-19 | Disposition: A | Discharge status: Home / Self Care | Payer: Medicare PPO | Source: Ambulatory Visit | Attending: Cardiology | Admitting: Cardiology

## 2021-02-19 ENCOUNTER — Other Ambulatory Visit: Payer: Self-pay | Admitting: Nurse Practitioner

## 2021-02-19 DIAGNOSIS — E1159 Type 2 diabetes mellitus with other circulatory complications: Secondary | ICD-10-CM

## 2021-02-19 DIAGNOSIS — I214 Non-ST elevation (NSTEMI) myocardial infarction: Secondary | ICD-10-CM | POA: Diagnosis not present

## 2021-02-19 DIAGNOSIS — Z951 Presence of aortocoronary bypass graft: Secondary | ICD-10-CM

## 2021-02-19 DIAGNOSIS — Z794 Long term (current) use of insulin: Secondary | ICD-10-CM

## 2021-02-19 NOTE — Progress Notes (Signed)
Daily Session Note  Patient Details  Name: [name] [name] MRN: [id number] Date of Birth: Nov 24, 1955 Referring Provider:   Flowsheet Row CARDIAC REHAB PHASE II ORIENTATION from 01/16/2021 in [place]  Referring Provider Dr. Domenic Polite       Encounter Date: 02/19/2021  Check In:  Session Check In - 02/19/21 0930       Check-In   Supervising physician immediately available to respond to emergencies [facility] MD immediately available    Physician(s) Dr. Harl Bowie    Location [facility]    Staff Present Aundra Dubin, RN, BSN;Heather Otho Ket, BS, Exercise Physiologist    Virtual Visit No    Medication changes reported     No    Fall or balance concerns reported    No    Tobacco Cessation No Change    Warm-up and Cool-down Performed as group-led instruction    Resistance Training Performed Yes    VAD Patient? No    PAD/SET Patient? No      Pain Assessment   Currently in Pain? No/denies    Pain Score 0-No pain    Multiple Pain Sites No             Capillary Blood Glucose: No results found for this or any previous visit (from the past 24 hour(s)).    Social History   Tobacco Use  Smoking Status Never  Smokeless Tobacco Never    Goals Met:  Independence with exercise equipment Exercise tolerated well No report of concerns or symptoms today Strength training completed today  Goals Unmet:  Not Applicable  Comments: Check out 1030.   Dr. Kathie Dike is Medical Director for [facility].

## 2021-02-21 ENCOUNTER — Encounter (HOSPITAL_COMMUNITY): Payer: Medicare PPO

## 2021-02-24 ENCOUNTER — Encounter (HOSPITAL_COMMUNITY): Payer: Medicare PPO

## 2021-02-26 ENCOUNTER — Encounter (HOSPITAL_COMMUNITY)
Admission: RE | Admit: 2021-02-26 | Discharge: 2021-02-26 | Disposition: A | Discharge status: Home / Self Care | Payer: Medicare PPO | Source: Ambulatory Visit | Attending: Cardiology | Admitting: Cardiology

## 2021-02-26 DIAGNOSIS — Z951 Presence of aortocoronary bypass graft: Secondary | ICD-10-CM | POA: Diagnosis not present

## 2021-02-26 DIAGNOSIS — I214 Non-ST elevation (NSTEMI) myocardial infarction: Secondary | ICD-10-CM

## 2021-02-26 NOTE — Progress Notes (Signed)
Daily Session Note  Patient Details  Name: [name] [name] MRN: [id number] Date of Birth: 08-Mar-1956 Referring Provider:   Flowsheet Row CARDIAC REHAB PHASE II ORIENTATION from 01/16/2021 in [place]  Referring Provider Dr. Domenic Polite       Encounter Date: 02/26/2021  Check In:  Session Check In - 02/26/21 0930       Check-In   Supervising physician immediately available to respond to emergencies [facility] MD immediately available    Physician(s) Dr. Domenic Polite    Location [facility]    Staff Present Aundra Dubin, RN, BSN;Uno Esau Otho Ket, BS, Exercise Physiologist    Virtual Visit No    Medication changes reported     No    Fall or balance concerns reported    No    Tobacco Cessation No Change    Warm-up and Cool-down Performed as group-led instruction    Resistance Training Performed Yes    VAD Patient? No    PAD/SET Patient? No      Pain Assessment   Currently in Pain? No/denies    Pain Score 0-No pain    Multiple Pain Sites No             Capillary Blood Glucose: No results found for this or any previous visit (from the past 24 hour(s)).    Social History   Tobacco Use  Smoking Status Never  Smokeless Tobacco Never    Goals Met:  Independence with exercise equipment Exercise tolerated well No report of concerns or symptoms today Strength training completed today  Goals Unmet:  Not Applicable  Comments: check out 1030   Dr. Kathie Dike is Medical Director for [facility].

## 2021-02-28 ENCOUNTER — Encounter (HOSPITAL_COMMUNITY): Payer: Medicare PPO

## 2021-02-28 NOTE — Progress Notes (Signed)
Discharge Progress Report  Patient Details  Name: [name] [name] MRN: [id number] Date of Birth: Mar 09, 1956 Referring Provider:   Flowsheet Row CARDIAC REHAB PHASE II ORIENTATION from 01/16/2021 in [place]  Referring Provider Dr. Domenic Polite        Number of Visits: 12  Reason for Discharge:  Early Exit:  Personal  Smoking History:  Social History   Tobacco Use  Smoking Status Never  Smokeless Tobacco Never    Diagnosis:  S/P CABG x 4  NSTEMI (non-ST elevated myocardial infarction) ([place])  ADL [facility]:   Initial Exercise Prescription:  Initial Exercise Prescription - 01/16/21 1000       Date of Initial Exercise RX and Referring Provider   Date 01/16/21    Referring Provider Dr. Domenic Polite    Expected Discharge Date 04/11/21      NuStep   Level 1    SPM 70    Minutes 22      Arm Ergometer   Level 1    RPM 60    Minutes 17      Prescription Details   Frequency (times per week) 3    Duration Progress to 30 minutes of continuous aerobic without signs/symptoms of physical distress      Intensity   THRR 40-80% of Max Heartrate 61-122    Ratings of Perceived Exertion 11-15    Perceived Dyspnea 0-4      Resistance Training   Training Prescription Yes    Weight 3    Reps 10-15             Discharge Exercise Prescription (Final Exercise Prescription Changes):  Exercise Prescription Changes - 02/12/21 1400       Response to Exercise   Blood Pressure (Admit) 120/60    Blood Pressure (Exercise) 130/62    Blood Pressure (Exit) 110/60    Heart Rate (Admit) 86 bpm    Heart Rate (Exercise) 111 bpm    Heart Rate (Exit) 95 bpm    Rating of Perceived Exertion (Exercise) 12    Duration Continue with 30 min of aerobic exercise without signs/symptoms of physical distress.    Intensity THRR unchanged      Progression   Progression Continue to progress workloads to maintain intensity without signs/symptoms of physical distress.       Resistance Training   Training Prescription Yes    Weight 4    Reps 10-15    Time 10 Minutes      NuStep   Level 2    SPM 103    Minutes 22    METs 2.25      Arm Ergometer   Level 2    RPM 67    Minutes 17    METs 2.05             Functional Capacity:  6 Minute Walk     Row Name 01/16/21 1003         6 Minute Walk   Phase Initial     Distance 1000 feet     Walk Time 6 minutes     # of Rest Breaks 0     MPH 1.9     METS 2.32     RPE 13     VO2 Peak 8.13     Symptoms No     Resting HR 83 bpm     Resting BP 128/62     Resting Oxygen Saturation  95 %     Exercise Oxygen  Saturation  during 6 min walk 96 %     Max Ex. HR 109 bpm     Max Ex. BP 150/65     2 Minute Post BP 130/65              Psychological, QOL, Others - Outcomes: PHQ 2/9: Depression screen [facility] 2/9 02/06/2021 01/16/2021 11/06/2020 09/17/2020 08/05/2020  Decreased Interest 1 0 0 0 0  Down, Depressed, Hopeless 1 0 0 0 0  PHQ - 2 Score 2 0 0 0 0  Altered sleeping 3 1 3  - -  Tired, decreased energy 2 1 2  - -  Change in appetite 1 1 1  - -  Feeling bad or failure about yourself  1 0 0 - -  Trouble concentrating 0 0 0 - -  Moving slowly or fidgety/restless 0 0 0 - -  Suicidal thoughts 0 0 0 - -  PHQ-9 Score 9 3 6  - -  Difficult doing work/chores Not difficult at all Not difficult at all Not difficult at all - -  Some recent data might be hidden    Quality of Life:  Quality of Life - 01/16/21 1008       Quality of Life   Select Quality of Life      Quality of Life Scores   Health/Function Pre 21 %    Socioeconomic Pre 29.25 %    Psych/Spiritual Pre 24 %    Family Pre 28.8 %    GLOBAL Pre 24.71 %             Personal Goals: Goals established at orientation with interventions provided to work toward goal.  Personal Goals and Risk Factors at Admission - 01/16/21 0856       Core Components/Risk Factors/Patient Goals on Admission    Weight Management Yes    Intervention  Weight Management: Develop a combined nutrition and exercise program designed to reach desired caloric intake, while maintaining appropriate intake of nutrient and fiber, sodium and fats, and appropriate energy expenditure required for the weight goal.;Weight Management: Provide education and appropriate resources to help participant work on and attain dietary goals.;Weight Management/Obesity: Establish reasonable short term and long term weight goals.;Obesity: Provide education and appropriate resources to help participant work on and attain dietary goals.    Expected Outcomes Short Term: Continue to assess and modify interventions until short term weight is achieved;Long Term: Adherence to nutrition and physical activity/exercise program aimed toward attainment of established weight goal;Weight Loss: Understanding of general recommendations for a balanced deficit meal plan, which promotes 1-2 lb weight loss per week and includes a negative energy balance of [phone] kcal/d;Understanding recommendations for meals to include 15-35% energy as protein, 25-35% energy from fat, 35-60% energy from carbohydrates, less than 200mg  of dietary cholesterol, 20-35 gm of total fiber daily;Understanding of distribution of calorie intake throughout the day with the consumption of 4-5 meals/snacks    Improve shortness of breath with ADL's Yes    Intervention Provide education, individualized exercise plan and daily activity instruction to help decrease symptoms of SOB with activities of daily living.    Expected Outcomes Short Term: Improve cardiorespiratory fitness to achieve a reduction of symptoms when performing ADLs;Long Term: Be able to perform more ADLs without symptoms or delay the onset of symptoms    Diabetes Yes    Intervention Provide education about signs/symptoms and action to take for hypo/hyperglycemia.;Provide education about proper nutrition, including hydration, and aerobic/resistive exercise prescription  along with prescribed medications to  achieve blood glucose in normal ranges: Fasting glucose 65-99 mg/dL    Expected Outcomes Short Term: Participant verbalizes understanding of the signs/symptoms and immediate care of hyper/hypoglycemia, proper foot care and importance of medication, aerobic/resistive exercise and nutrition plan for blood glucose control.;Long Term: Attainment of HbA1C < 7%.              Personal Goals Discharge:  Goals and Risk Factor Review     Row Name 01/29/21 1317 02/24/21 1304           Core Components/Risk Factors/Patient Goals Review   Personal Goals Review Weight Management/Obesity;Other;Diabetes Weight Management/Obesity;Other;Diabetes      Review Patient was referred to [facility] for the 3Rd time with CABGx4. He has multiple risk factors for CAD and is participating in the program for risk modification. He has completed 6 sessions and is doing well. His personal for the program are to lose weight; continue to increase his stamina and energy levels. He last A1C was 08/16/20 at 8.2% trending up from 11 months ago. He had an episode of hypoglycemia today after exercise. He blood pressure is well controlled. We will continue to monitor his progress as he works towards meeting these goals. Patient has completed 11 sessions gaining 2 lbs since last 30 day review. His attendance has not been consistent. He has had progressions. He called out today with hypoglycemia. He did have an episode of hypogylcemia during exercise which was successfully treated. His blood pressure is usually at goal and his reported glucose readings at home are averaging 150 mg/dl. He personal goals continues to be to lose weight and continue to increase his stamia and energy. We will continue to monitor his progress as he works towards meeting these goals.      Expected Outcomes Patient will complete the program meeting both personal and program goals. Patient will complete the program meeting both personal  and program goals.               Exercise Goals and Review:  Exercise Goals     Row Name 01/16/21 1006 02/03/21 1416           Exercise Goals   Increase Physical Activity Yes Yes      Intervention Provide advice, education, support and counseling about physical activity/exercise needs.;Develop an individualized exercise prescription for aerobic and resistive training based on initial evaluation findings, risk stratification, comorbidities and participant's personal goals. Provide advice, education, support and counseling about physical activity/exercise needs.;Develop an individualized exercise prescription for aerobic and resistive training based on initial evaluation findings, risk stratification, comorbidities and participant's personal goals.      Expected Outcomes Short Term: Attend rehab on a regular basis to increase amount of physical activity.;Long Term: Add in home exercise to make exercise part of routine and to increase amount of physical activity.;Long Term: Exercising regularly at least 3-5 days a week. Short Term: Attend rehab on a regular basis to increase amount of physical activity.;Long Term: Add in home exercise to make exercise part of routine and to increase amount of physical activity.;Long Term: Exercising regularly at least 3-5 days a week.      Increase Strength and Stamina Yes Yes      Intervention Provide advice, education, support and counseling about physical activity/exercise needs.;Develop an individualized exercise prescription for aerobic and resistive training based on initial evaluation findings, risk stratification, comorbidities and participant's personal goals. Provide advice, education, support and counseling about physical activity/exercise needs.;Develop an individualized exercise prescription for aerobic and  resistive training based on initial evaluation findings, risk stratification, comorbidities and participant's personal goals.      Expected  Outcomes Short Term: Increase workloads from initial exercise prescription for resistance, speed, and METs.;Short Term: Perform resistance training exercises routinely during rehab and add in resistance training at home;Long Term: Improve cardiorespiratory fitness, muscular endurance and strength as measured by increased METs and functional capacity (6MWT) Short Term: Increase workloads from initial exercise prescription for resistance, speed, and METs.;Short Term: Perform resistance training exercises routinely during rehab and add in resistance training at home;Long Term: Improve cardiorespiratory fitness, muscular endurance and strength as measured by increased METs and functional capacity (6MWT)      Able to understand and use rate of perceived exertion (RPE) scale Yes Yes      Intervention Provide education and explanation on how to use RPE scale Provide education and explanation on how to use RPE scale      Expected Outcomes Short Term: Able to use RPE daily in rehab to express subjective intensity level;Long Term:  Able to use RPE to guide intensity level when exercising independently Short Term: Able to use RPE daily in rehab to express subjective intensity level;Long Term:  Able to use RPE to guide intensity level when exercising independently      Knowledge and understanding of Target Heart Rate Range (THRR) Yes Yes      Intervention Provide education and explanation of THRR including how the numbers were predicted and where they are located for reference Provide education and explanation of THRR including how the numbers were predicted and where they are located for reference      Expected Outcomes Short Term: Able to state/look up THRR;Short Term: Able to use daily as guideline for intensity in rehab;Long Term: Able to use THRR to govern intensity when exercising independently Short Term: Able to state/look up THRR;Short Term: Able to use daily as guideline for intensity in rehab;Long Term: Able to  use THRR to govern intensity when exercising independently      Able to check pulse independently Yes Yes      Intervention Provide education and demonstration on how to check pulse in carotid and radial arteries.;Review the importance of being able to check your own pulse for safety during independent exercise Provide education and demonstration on how to check pulse in carotid and radial arteries.;Review the importance of being able to check your own pulse for safety during independent exercise      Expected Outcomes Short Term: Able to explain why pulse checking is important during independent exercise;Long Term: Able to check pulse independently and accurately Short Term: Able to explain why pulse checking is important during independent exercise;Long Term: Able to check pulse independently and accurately      Understanding of Exercise Prescription Yes Yes      Intervention Provide education, explanation, and written materials on patient's individual exercise prescription Provide education, explanation, and written materials on patient's individual exercise prescription      Expected Outcomes Short Term: Able to explain program exercise prescription;Long Term: Able to explain home exercise prescription to exercise independently Short Term: Able to explain program exercise prescription;Long Term: Able to explain home exercise prescription to exercise independently               Exercise Goals Re-Evaluation:  Exercise Goals Re-Evaluation     Row Name 02/03/21 1417             Exercise Goal Re-Evaluation   Exercise Goals Review Increase  Physical Activity;Increase Strength and Stamina;Able to understand and use rate of perceived exertion (RPE) scale;Knowledge and understanding of Target Heart Rate Range (THRR);Able to check pulse independently;Understanding of Exercise Prescription       Comments Pt has completed 8 sessions of cardiac rehab. He is very motivated to [employer] and is  progressing during the session. He is currently working at 2.52 METs on the stepper. Will continue to montior and progress as able.       Expected Outcomes Through exercise at rehab and at home, the patient will meet their stated goals.                Nutrition & Weight - Outcomes:  Pre Biometrics - 01/16/21 1007       Pre Biometrics   Height 5\' 9"  (1.753 m)    Weight 240 lb 8.4 oz (109.1 kg)    Waist Circumference 50 inches    Hip Circumference 42 inches    Waist to Hip Ratio 1.19 %    BMI (Calculated) 35.5    Triceps Skinfold 9 mm    % Body Fat 33 %    Grip Strength 24.3 kg    Flexibility 0 in    Single Leg Stand 3 seconds              Nutrition:  Nutrition Therapy & Goals - 02/26/21 1248       Personal Nutrition Goals   Comments Patient scored 15 on his diet assessment. We offer 2 educational sessions on heart healhy nutrition with handouts.      Intervention Plan   Intervention Nutrition handout(s) given to patient.    Expected Outcomes Short Term Goal: Understand basic principles of dietary content, such as calories, fat, sodium, cholesterol and nutrients.             Nutrition Discharge:  Nutrition Assessments - 01/16/21 0828       MEDFICTS Scores   Pre Score 15             Education Questionnaire Score:  Knowledge Questionnaire Score - 01/16/21 2956       Knowledge Questionnaire Score   Pre Score 22/24             Pt discharged from cardiac rehab after 12 sessions. Pt states that he knows what he needs to do, and that he will exercise on his treadmill at home.

## 2021-03-03 ENCOUNTER — Encounter (HOSPITAL_COMMUNITY): Payer: Medicare PPO

## 2021-03-05 ENCOUNTER — Encounter (HOSPITAL_COMMUNITY): Payer: Medicare PPO

## 2021-03-07 ENCOUNTER — Encounter (HOSPITAL_COMMUNITY): Payer: Medicare PPO

## 2021-03-10 ENCOUNTER — Encounter (HOSPITAL_COMMUNITY): Payer: Medicare PPO

## 2021-03-11 ENCOUNTER — Other Ambulatory Visit: Payer: Self-pay | Admitting: Nurse Practitioner

## 2021-03-11 DIAGNOSIS — E1159 Type 2 diabetes mellitus with other circulatory complications: Secondary | ICD-10-CM

## 2021-03-12 ENCOUNTER — Encounter (HOSPITAL_COMMUNITY): Payer: Medicare PPO

## 2021-03-14 ENCOUNTER — Encounter (HOSPITAL_COMMUNITY): Payer: Medicare PPO

## 2021-03-17 ENCOUNTER — Other Ambulatory Visit: Payer: Self-pay | Admitting: Nurse Practitioner

## 2021-03-17 ENCOUNTER — Encounter (HOSPITAL_COMMUNITY): Payer: Medicare PPO

## 2021-03-17 DIAGNOSIS — I2510 Atherosclerotic heart disease of native coronary artery without angina pectoris: Secondary | ICD-10-CM

## 2021-03-18 NOTE — Progress Notes (Signed)
Cardiology Office Note  Date: 03/19/2021   ID: Cinch, Ormond 1956-03-03, MRN [id number]  PCP:  Chevis Pretty, FNP  Cardiologist:  Rozann Lesches, MD Electrophysiologist:  None   Chief Complaint  Patient presents with   Cardiac follow-up    History of Present Illness: [name] [name] is a 65 y.o. male last seen in September.  He is here for a follow-up visit.  He tells me that his father recently passed away, he has had a stressful December.  From a cardiac perspective he does not describe any active angina at this time.  We went over his medications and discussed reducing his aspirin to 81 mg daily at this point.  Blood pressure was mildly elevated today.  I reviewed his most recent lab work from November.  He has continued on low-dose Lasix for control of mild leg swelling.  Renal function and potassium were normal.  At this point he is not on a potassium supplement.  Past Medical History:  Diagnosis Date   Anxiety    Coronary atherosclerosis of native coronary artery    DES distal circumflex 2005; DES LAD/diagonal bifurcation 09/2010; DES ostial LAD and DES left PL 01/2020; CABG May 2022 (LIMA to LAD, SVG to diagonal, SVG to OM1 and OM 3)   Essential hypertension    GERD (gastroesophageal reflux disease)    History of kidney stones    Mixed hyperlipidemia    Myocardial infarction ([place])    Anterolateral with VF arrest 7/12   Type 2 diabetes mellitus ([place])     Past Surgical History:  Procedure Laterality Date   CARDIAC CATHETERIZATION  2012   CAROTID STENT     stents x 2    CHOLECYSTECTOMY N/A 12/04/2015   Procedure: LAPAROSCOPIC CHOLECYSTECTOMY;  Surgeon: Aviva Signs, MD;  [facility]: [facility];  Service: General;  Laterality: N/A;   CHOLECYSTECTOMY, LAPAROSCOPIC  12/05/2015   CORONARY ANGIOPLASTY  2012   STENT X 1 2012, STENT X 1 YRS BEFORE   CORONARY ARTERY BYPASS GRAFT N/A 08/21/2020   Procedure: CORONARY ARTERY BYPASS GRAFTING (CABG) X 4, USING LEFT  INTERNAL MAMMARY ARTERY AND RIGHT GREATER SAPHENOUS VEIN HARVESTED ENDOSCOPICALLY. SVG TO OM1, OM3 SEQUENTIALLY, SVG TO DIAG., LIMA TO LAD;  Surgeon: Melrose Nakayama, MD;  [facility];  Service: Open Heart Surgery;  Laterality: N/A;   CORONARY ATHERECTOMY N/A 02/09/2020   Procedure: CORONARY ATHERECTOMY;  Surgeon: Wellington Hampshire, MD;  Location: [facility];  Service: Cardiovascular;  Laterality: N/A;   CORONARY STENT INTERVENTION N/A 02/09/2020   Procedure: CORONARY STENT INTERVENTION;  Surgeon: Wellington Hampshire, MD;  Location: [place] [facility];  Service: Cardiovascular;  Laterality: N/A;   CORONARY STENT INTERVENTION N/A 05/22/2020   Procedure: CORONARY STENT INTERVENTION;  Surgeon: Jettie Booze, MD;  Location: [place] [facility];  Service: Cardiovascular;  Laterality: N/A;   ENDOVEIN HARVEST OF GREATER SAPHENOUS VEIN Right 08/21/2020   Procedure: ENDOVEIN HARVEST OF GREATER SAPHENOUS VEIN;  Surgeon: Melrose Nakayama, MD;  Location: [place];  Service: Open Heart Surgery;  Laterality: Right;   HYDROCELE EXCISION Bilateral 06/02/2017   Procedure: HYDROCELECTOMY ADULT;  Surgeon: Ceasar Mons, MD;  [facility];  Service: Urology;  Laterality: Bilateral;  ONLY NEEDS 45 MIN   INGUINAL HERNIA REPAIR     RIGHT GROIN   INTRAVASCULAR IMAGING/OCT N/A 05/22/2020   Procedure: INTRAVASCULAR IMAGING/OCT;  Surgeon: Jettie Booze, MD;  Location: [facility] [facility];  Service: Cardiovascular;  Laterality: N/A;   INTRAVASCULAR ULTRASOUND/IVUS N/A 02/09/2020   Procedure: Intravascular Ultrasound/IVUS;  Surgeon: Wellington Hampshire, MD;  Location: [place] [facility];  Service: Cardiovascular;  Laterality: N/A;   KNEE ARTHROSCOPY Left 05/23/2019   Procedure: LEFT KNEE ARTHROSCOPY AND DEBRIDEMENT PARTIAL MEDIAL MENISECTOMY;  Surgeon: Newt Minion, MD;  Location: [place];  Service: Orthopedics;  Laterality: Left;   LEFT HEART CATH AND CORONARY  ANGIOGRAPHY N/A 02/07/2020   Procedure: LEFT HEART CATH AND CORONARY ANGIOGRAPHY;  Surgeon: Leonie Man, MD;  Location: [facility] [facility];  Service: Cardiovascular;  Laterality: N/A;   LEFT HEART CATH AND CORONARY ANGIOGRAPHY N/A 05/22/2020   Procedure: LEFT HEART CATH AND CORONARY ANGIOGRAPHY;  Surgeon: Jettie Booze, MD;  Location: [facility];  Service: Cardiovascular;  Laterality: N/A;   LEFT HEART CATH AND CORONARY ANGIOGRAPHY N/A 08/15/2020   Procedure: LEFT HEART CATH AND CORONARY ANGIOGRAPHY;  Surgeon: Lorretta Harp, MD;  Location: [place] [facility];  Service: Cardiovascular;  Laterality: N/A;   TEE WITHOUT CARDIOVERSION N/A 08/21/2020   Procedure: TRANSESOPHAGEAL ECHOCARDIOGRAM (TEE);  Surgeon: Melrose Nakayama, MD;  Location: [place];  Service: Open Heart Surgery;  Laterality: N/A;   TRIGGER FINGER RELEASE Right 01/08/2015   Procedure: RELEASE TRIGGER FINGER/A-1 PULLEY RIGHT MIDDLE FINGER;  Surgeon: Daryll Brod, MD;  Location: [facility];  Service: Orthopedics;  Laterality: Right;    Current Outpatient Medications  Medication Sig Dispense Refill   Accu-Chek Softclix Lancets lancets USE TO CHECK BLOOD SUGAR 4 TIMES DAILY. Dx E11.8 100 each PRN   aspirin EC 81 MG tablet Take 1 tablet (81 mg total) by mouth daily. Swallow whole. 90 tablet 3   atorvastatin (LIPITOR) 80 MG tablet Take 1 tablet (80 mg total) by mouth daily. 90 tablet 3   blood glucose meter kit and supplies Dispense based on patient and insurance preference. Use up to four times daily as directed. (FOR ICD-10 E10.9, E11.9). 1 each 0   carvedilol (COREG) 12.5 MG tablet TAKE 1 & 1/2 TABLET TWICE DAILY WITH A MEAL. 135 tablet 0   cetirizine (ZYRTEC) 10 MG tablet Take 10 mg by mouth daily.     CINNAMON PO Take 1 tablet by mouth daily.     CRANBERRY PO Take 1 tablet by mouth 2 (two) times daily.     furosemide (LASIX) 20 MG tablet Take 20 mg daily as needed for leg swelling 90 tablet 3    GLOBAL EASE INJECT PEN NEEDLES 31G X 8 MM MISC Use up to 8 times a day Dx E11.8 800 each 3   glucose blood (ACCU-CHEK AVIVA PLUS) test strip Check BS up to 4 times a day dx E11.8 100 strip 11   Insulin Aspart FlexPen (NOVOLOG) 100 UNIT/ML INJECT 45-75 UNITS AT BREAKFAST AND DINNER. 15 mL 1   insulin glargine (LANTUS SOLOSTAR) 100 UNIT/ML Solostar Pen INJECT 44 UNITS IN MORNING AND 75 UNITS AT NIGHT. 30 mL 0   isosorbide mononitrate (IMDUR) 30 MG 24 hr tablet Take 0.5 tablets (15 mg total) by mouth every evening. 45 tablet 3   lisinopril (ZESTRIL) 2.5 MG tablet Take 1 tablet (2.5 mg total) by mouth daily. 90 tablet 2   metFORMIN (GLUCOPHAGE) 500 MG tablet TAKE 2 TABLETS BY MOUTH TWICE DAILY. 120 tablet 2   pantoprazole (PROTONIX) 40 MG tablet Take 1 tablet (40 mg total) by mouth daily as needed (for acid reflux). 90 tablet 3   Semaglutide,0.25 or 0.5MG/DOS, (  OZEMPIC, 0.25 OR 0.5 MG/DOSE,) 2 MG/1.5ML SOPN Inject 0.5 mg into the skin once a week. 6 mL 5   tamsulosin (FLOMAX) 0.4 MG CAPS capsule Take 1 capsule (0.4 mg total) by mouth at bedtime. 90 capsule 1   traMADol (ULTRAM) 50 MG tablet Take 1 tablet (50 mg total) by mouth every 6 (six) hours as needed for severe pain. 30 tablet 0   No current facility-administered medications for this visit.   Allergies:  Patient has no known allergies.   ROS: No orthopnea or PND.  No palpitations.  Physical Exam: VS:  BP 138/82    Pulse 90    Ht _0  (1.753 m)    Wt 245 lb (111.1 kg)    SpO2 95%    BMI 36.18 kg/m , BMI Body mass index is 36.18 kg/m.  Wt Readings from Last 3 Encounters:  03/19/21 245 lb (111.1 kg)  02/06/21 243 lb (110.2 kg)  02/03/21 245 lb 2.4 oz (111.2 kg)    General: Patient appears comfortable at rest. HEENT: Conjunctiva and lids normal, wearing a mask. Neck: Supple, no elevated JVP or carotid bruits, no thyromegaly. Lungs: Clear to auscultation, nonlabored breathing at rest. Cardiac: Regular rate and rhythm, no S3 or  significant systolic murmur, no pericardial rub.  ECG:  An ECG dated 11/27/2020 was personally reviewed today and demonstrated:  Sinus rhythm with prolonged PR interval, nonspecific ST-T changes.  Recent Labwork: 08/22/2020: Magnesium 2.0 02/06/2021: ALT 20; AST 20; BUN 19; Creatinine, Ser 0.98; Hemoglobin 12.3; Platelets 146; Potassium 4.9; Sodium 137     Component Value Date/Time   CHOL 86 (L) 02/06/2021 1521   TRIG 150 (H) 02/06/2021 1521   HDL 21 (L) 02/06/2021 1521   CHOLHDL 4.1 02/06/2021 1521   CHOLHDL 3.7 08/16/2020 0319   VLDL 26 08/16/2020 0319   LDLCALC 39 02/06/2021 1521    Other Studies Reviewed Today:  Echocardiogram 08/16/2020:  1. Left ventricular ejection fraction, by estimation, is 60 to 65%. The  left ventricle has normal function. Left ventricular endocardial border  not optimally defined to evaluate regional wall motion. Grossly, no wall  motion abnormalities seen. There is  mild concentric left ventricular hypertrophy. Left ventricular diastolic  parameters are consistent with Grade I diastolic dysfunction (impaired  relaxation). Elevated left atrial pressure.   2. Right ventricular systolic function is normal. The right ventricular  size is normal. Tricuspid regurgitation signal is inadequate for assessing  PA pressure.   3. The mitral valve is normal in structure. No evidence of mitral valve  regurgitation.   4. The aortic valve is tricuspid. There is mild calcification of the  aortic valve. Aortic valve regurgitation is not visualized. Mild aortic  valve sclerosis is present, with no evidence of aortic valve stenosis.  Assessment and Plan:  1.  Multivessel CAD status post CABG in May of this year.  He is doing well in terms of angina control at this time.  We did discuss maintaining a regular exercise plan, walking regimen would be optimal.  Aspirin being reduced to 81 mg daily.  Otherwise continue Coreg, Imdur, lisinopril, and Lipitor.  He also remains on  low-dose Lasix for control of mild leg edema.  LVEF 60 to 65% with mild diastolic dysfunction.  2.  Mixed hyperlipidemia, continues on Lipitor.  Last LDL 39.  3.  Essential hypertension, pressure is mildly elevated today.  Plans to get a new automatic cuff to check blood pressure at home.  If trend stays up he  has room for further up titration of medical therapy.  Medication Adjustments/Labs and Tests Ordered: Current medicines are reviewed at length with the patient today.  Concerns regarding medicines are outlined above.   Tests Ordered: No orders of the defined types were placed in this encounter.   Medication Changes: Meds ordered this encounter  Medications   aspirin EC 81 MG tablet    Sig: Take 1 tablet (81 mg total) by mouth daily. Swallow whole.    Dispense:  90 tablet    Refill:  3    03/19/2021 dose decrease    Disposition:  Follow up  6 months.  Signed, Satira Sark, MD, [facility] 03/19/2021 10:02 AM    [facility], [place], [place] [address] Phone: [phone]; Fax: [phone]

## 2021-03-19 ENCOUNTER — Encounter (HOSPITAL_COMMUNITY): Payer: Medicare PPO

## 2021-03-19 ENCOUNTER — Encounter: Payer: Self-pay | Admitting: Cardiology

## 2021-03-19 ENCOUNTER — Ambulatory Visit: Payer: Medicare PPO | Admitting: Cardiology

## 2021-03-19 VITALS — BP 138/82 | HR 90 | Ht 69.0 in | Wt 245.0 lb

## 2021-03-19 DIAGNOSIS — E782 Mixed hyperlipidemia: Secondary | ICD-10-CM | POA: Diagnosis not present

## 2021-03-19 DIAGNOSIS — I10 Essential (primary) hypertension: Secondary | ICD-10-CM | POA: Diagnosis not present

## 2021-03-19 DIAGNOSIS — I25119 Atherosclerotic heart disease of native coronary artery with unspecified angina pectoris: Secondary | ICD-10-CM

## 2021-03-19 MED ORDER — ASPIRIN EC 81 MG PO TBEC: 81.0000 mg | DELAYED_RELEASE_TABLET | Freq: Every day | ORAL | 3 refills | Status: AC

## 2021-03-19 NOTE — Patient Instructions (Addendum)
Medication Instructions:  Your physician has recommended you make the following change in your medication:  Decrease aspirin to 81 mg daily Continue other medications the same  Labwork: none  Testing/Procedures: none  Follow-Up: Your physician recommends that you schedule a follow-up appointment in: 6 months  Any Other Special Instructions Will Be Listed Below (If Applicable).  If you need a refill on your cardiac medications before your next appointment, please call your pharmacy.

## 2021-03-21 ENCOUNTER — Encounter (HOSPITAL_COMMUNITY): Payer: Medicare PPO

## 2021-03-24 ENCOUNTER — Encounter (HOSPITAL_COMMUNITY): Payer: Medicare PPO

## 2021-03-26 ENCOUNTER — Encounter (HOSPITAL_COMMUNITY): Payer: Medicare PPO

## 2021-03-28 ENCOUNTER — Encounter (HOSPITAL_COMMUNITY): Payer: Medicare PPO

## 2021-03-31 ENCOUNTER — Encounter (HOSPITAL_COMMUNITY): Payer: Medicare PPO

## 2021-04-02 ENCOUNTER — Encounter (HOSPITAL_COMMUNITY): Payer: Medicare PPO

## 2021-04-04 ENCOUNTER — Other Ambulatory Visit: Payer: Self-pay | Admitting: Nurse Practitioner

## 2021-04-04 ENCOUNTER — Encounter (HOSPITAL_COMMUNITY): Payer: Medicare PPO

## 2021-04-07 ENCOUNTER — Encounter (HOSPITAL_COMMUNITY): Payer: Medicare PPO

## 2021-04-09 ENCOUNTER — Encounter (HOSPITAL_COMMUNITY): Payer: Medicare PPO

## 2021-04-10 ENCOUNTER — Other Ambulatory Visit: Payer: Self-pay | Admitting: Nurse Practitioner

## 2021-04-10 DIAGNOSIS — Z794 Long term (current) use of insulin: Secondary | ICD-10-CM

## 2021-04-10 DIAGNOSIS — E1159 Type 2 diabetes mellitus with other circulatory complications: Secondary | ICD-10-CM

## 2021-04-11 ENCOUNTER — Encounter (HOSPITAL_COMMUNITY): Payer: Medicare PPO

## 2021-04-17 ENCOUNTER — Other Ambulatory Visit: Payer: Self-pay | Admitting: Nurse Practitioner

## 2021-04-17 DIAGNOSIS — I2510 Atherosclerotic heart disease of native coronary artery without angina pectoris: Secondary | ICD-10-CM

## 2021-04-25 ENCOUNTER — Other Ambulatory Visit: Payer: Self-pay | Admitting: Nurse Practitioner

## 2021-05-05 ENCOUNTER — Other Ambulatory Visit: Payer: Self-pay | Admitting: Nurse Practitioner

## 2021-05-05 DIAGNOSIS — Z794 Long term (current) use of insulin: Secondary | ICD-10-CM

## 2021-05-05 DIAGNOSIS — E1159 Type 2 diabetes mellitus with other circulatory complications: Secondary | ICD-10-CM

## 2021-05-09 ENCOUNTER — Ambulatory Visit: Payer: Medicare PPO | Admitting: Nurse Practitioner

## 2021-05-09 ENCOUNTER — Encounter: Payer: Self-pay | Admitting: Nurse Practitioner

## 2021-05-09 VITALS — BP 136/82 | HR 78 | Temp 97.3°F | Resp 20 | Ht 69.0 in | Wt 249.0 lb

## 2021-05-09 DIAGNOSIS — E118 Type 2 diabetes mellitus with unspecified complications: Secondary | ICD-10-CM

## 2021-05-09 DIAGNOSIS — Z794 Long term (current) use of insulin: Secondary | ICD-10-CM

## 2021-05-09 DIAGNOSIS — I10 Essential (primary) hypertension: Secondary | ICD-10-CM | POA: Diagnosis not present

## 2021-05-09 DIAGNOSIS — N401 Enlarged prostate with lower urinary tract symptoms: Secondary | ICD-10-CM

## 2021-05-09 DIAGNOSIS — Z6836 Body mass index (BMI) 36.0-36.9, adult: Secondary | ICD-10-CM | POA: Diagnosis not present

## 2021-05-09 DIAGNOSIS — R3911 Hesitancy of micturition: Secondary | ICD-10-CM | POA: Diagnosis not present

## 2021-05-09 DIAGNOSIS — E1169 Type 2 diabetes mellitus with other specified complication: Secondary | ICD-10-CM | POA: Diagnosis not present

## 2021-05-09 DIAGNOSIS — E785 Hyperlipidemia, unspecified: Secondary | ICD-10-CM | POA: Diagnosis not present

## 2021-05-09 DIAGNOSIS — I2510 Atherosclerotic heart disease of native coronary artery without angina pectoris: Secondary | ICD-10-CM | POA: Diagnosis not present

## 2021-05-09 DIAGNOSIS — K219 Gastro-esophageal reflux disease without esophagitis: Secondary | ICD-10-CM

## 2021-05-09 LAB — LIPID PANEL

## 2021-05-09 LAB — BAYER DCA HB A1C WAIVED: HB A1C (BAYER DCA - WAIVED): 8.1 % — ABNORMAL HIGH (ref 4.8–5.6)

## 2021-05-09 MED ORDER — ISOSORBIDE MONONITRATE ER 30 MG PO TB24: 15.0000 mg | ORAL_TABLET | Freq: Every evening | ORAL | 3 refills | Status: DC

## 2021-05-09 MED ORDER — INSULIN ASPART FLEXPEN 100 UNIT/ML ~~LOC~~ SOPN: PEN_INJECTOR | SUBCUTANEOUS | 0 refills | Status: DC

## 2021-05-09 MED ORDER — PANTOPRAZOLE SODIUM 40 MG PO TBEC: 40.0000 mg | DELAYED_RELEASE_TABLET | Freq: Every day | ORAL | 3 refills | Status: DC | PRN

## 2021-05-09 MED ORDER — LISINOPRIL 2.5 MG PO TABS: 2.5000 mg | ORAL_TABLET | Freq: Every day | ORAL | 2 refills | Status: DC

## 2021-05-09 MED ORDER — CARVEDILOL 12.5 MG PO TABS: ORAL_TABLET | ORAL | 1 refills | Status: DC

## 2021-05-09 MED ORDER — ATORVASTATIN CALCIUM 80 MG PO TABS: 80.0000 mg | ORAL_TABLET | Freq: Every day | ORAL | 3 refills | Status: DC

## 2021-05-09 MED ORDER — TAMSULOSIN HCL 0.4 MG PO CAPS: 0.4000 mg | ORAL_CAPSULE | Freq: Every day | ORAL | 1 refills | Status: DC

## 2021-05-09 MED ORDER — METFORMIN HCL 500 MG PO TABS: 1000.0000 mg | ORAL_TABLET | Freq: Two times a day (BID) | ORAL | 2 refills | Status: DC

## 2021-05-09 MED ORDER — OZEMPIC (1 MG/DOSE) 2 MG/1.5ML ~~LOC~~ SOPN: 1.0000 mg | PEN_INJECTOR | SUBCUTANEOUS | 5 refills | Status: DC

## 2021-05-09 MED ORDER — FUROSEMIDE 20 MG PO TABS: ORAL_TABLET | ORAL | 3 refills | Status: DC

## 2021-05-09 NOTE — Patient Instructions (Signed)

## 2021-05-09 NOTE — Progress Notes (Addendum)
Subjective:    Patient ID: [name] [name], male    DOB: [birth year], 66 y.o.   MRN: [id number]   Chief Complaint: medical management of chronic issues     HPI:  [name] [name] is a 66 y.o. who identifies as a male who was assigned male at birth.   Social history: Lives with: wife Work history: retired Statistician   Comes in today for follow up of the following chronic medical issues:  1. Essential hypertension, benign No c/o chest pain, sob or headache. Does  not check his blood pressure at home very often. BP Readings from Last 3 Encounters:  03/19/21 138/82  02/06/21 127/80  01/16/21 128/62     2. Hyperlipidemia associated with type 2 diabetes mellitus ([place]) He does try to watch his diet. Tries to exercise some daily. Lab Results  Component Value Date   CHOL 86 (L) 02/06/2021   HDL 21 (L) 02/06/2021   LDLCALC 39 02/06/2021   TRIG 150 (H) 02/06/2021   CHOLHDL 4.1 02/06/2021     3. Coronary artery disease involving native coronary artery of native heart without angina pectoris Has not had any chest pain since his last CABG. He quit cardiac rehab. He is walking daily.  4. Gastroesophageal reflux disease without esophagitis Is on protonix daily and is doing well  5. Type 2 diabetes mellitus with complication, with long-term current use of insulin (HCC) Fasting blood sugars have been running around 120-160. No low blood sugars. Lab Results  Component Value Date   HGBA1C 7.1 (H) 02/06/2021     6. Benign prostatic hyperplasia with urinary hesitancy No problems voiding  7. BMI 36.0-36.9,adult Weight is up 4 lbs Wt Readings from Last 3 Encounters:  05/09/21 249 lb (112.9 kg)  03/19/21 245 lb (111.1 kg)  02/06/21 243 lb (110.2 kg)   BMI Readings from Last 3 Encounters:  05/09/21 36.77 kg/m  03/19/21 36.18 kg/m  02/06/21 35.88 kg/m     New complaints: None today  No Known Allergies Outpatient Encounter Medications as of 05/09/2021   Medication Sig   Accu-Chek Softclix Lancets lancets USE TO CHECK BLOOD SUGAR 4 TIMES DAILY. Dx E11.8   aspirin EC 81 MG tablet Take 1 tablet (81 mg total) by mouth daily. Swallow whole.   atorvastatin (LIPITOR) 80 MG tablet Take 1 tablet (80 mg total) by mouth daily.   blood glucose meter kit and supplies Dispense based on patient and insurance preference. Use up to four times daily as directed. (FOR ICD-10 E10.9, E11.9).   carvedilol (COREG) 12.5 MG tablet TAKE 1 & 1/2 TABLET TWICE DAILY WITH A MEAL.   cetirizine (ZYRTEC) 10 MG tablet Take 10 mg by mouth daily.   CINNAMON PO Take 1 tablet by mouth daily.   CRANBERRY PO Take 1 tablet by mouth 2 (two) times daily.   furosemide (LASIX) 20 MG tablet Take 20 mg daily as needed for leg swelling   GLOBAL EASE INJECT PEN NEEDLES 31G X 8 MM MISC Use up to 8 times a day Dx E11.8   glucose blood (ACCU-CHEK AVIVA PLUS) test strip Check BS up to 4 times a day dx E11.8   Insulin Aspart FlexPen (NOVOLOG) 100 UNIT/ML INJECT 45-75 UNITS AT BREAKFAST AND DINNER.   isosorbide mononitrate (IMDUR) 30 MG 24 hr tablet Take 0.5 tablets (15 mg total) by mouth every evening.   LANTUS SOLOSTAR 100 UNIT/ML Solostar Pen INJECT 44 UNITS IN MORNING AND 75 UNITS AT NIGHT.   lisinopril (ZESTRIL)  2.5 MG tablet Take 1 tablet (2.5 mg total) by mouth daily.   metFORMIN (GLUCOPHAGE) 500 MG tablet TAKE 2 TABLETS BY MOUTH TWICE DAILY.   pantoprazole (PROTONIX) 40 MG tablet Take 1 tablet (40 mg total) by mouth daily as needed (for acid reflux).   Semaglutide,0.25 or 0.5MG/DOS, (OZEMPIC, 0.25 OR 0.5 MG/DOSE,) 2 MG/1.5ML SOPN Inject 0.5 mg into the skin once a week.   tamsulosin (FLOMAX) 0.4 MG CAPS capsule Take 1 capsule (0.4 mg total) by mouth at bedtime.   traMADol (ULTRAM) 50 MG tablet Take 1 tablet (50 mg total) by mouth every 6 (six) hours as needed for severe pain.   No facility-administered encounter medications on file as of 05/09/2021.    Past Surgical History:   Procedure Laterality Date   CARDIAC CATHETERIZATION  2012   CAROTID STENT     stents x 2    CHOLECYSTECTOMY N/A 12/04/2015   Procedure: LAPAROSCOPIC CHOLECYSTECTOMY;  Surgeon: Aviva Signs, MD;  [facility];  Service: General;  Laterality: N/A;   CHOLECYSTECTOMY, LAPAROSCOPIC  12/05/2015   CORONARY ANGIOPLASTY  2012   STENT X 1 2012, STENT X 1 YRS BEFORE   CORONARY ARTERY BYPASS GRAFT N/A 08/21/2020   Procedure: CORONARY ARTERY BYPASS GRAFTING (CABG) X 4, USING LEFT INTERNAL MAMMARY ARTERY AND RIGHT GREATER SAPHENOUS VEIN HARVESTED ENDOSCOPICALLY. SVG TO OM1, OM3 SEQUENTIALLY, SVG TO DIAG., LIMA TO LAD;  Surgeon: Melrose Nakayama, MD;  [facility];  Service: Open Heart Surgery;  Laterality: N/A;   CORONARY ATHERECTOMY N/A 02/09/2020   Procedure: CORONARY ATHERECTOMY;  Surgeon: Wellington Hampshire, MD;  Location: [place] [facility];  Service: Cardiovascular;  Laterality: N/A;   CORONARY STENT INTERVENTION N/A 02/09/2020   Procedure: CORONARY STENT INTERVENTION;  Surgeon: Wellington Hampshire, MD;  Location: [place];  Service: Cardiovascular;  Laterality: N/A;   CORONARY STENT INTERVENTION N/A 05/22/2020   Procedure: CORONARY STENT INTERVENTION;  Surgeon: Jettie Booze, MD;  Location: [place] [facility];  Service: Cardiovascular;  Laterality: N/A;   ENDOVEIN HARVEST OF GREATER SAPHENOUS VEIN Right 08/21/2020   Procedure: ENDOVEIN HARVEST OF GREATER SAPHENOUS VEIN;  Surgeon: Melrose Nakayama, MD;  Location: [place];  Service: Open Heart Surgery;  Laterality: Right;   HYDROCELE EXCISION Bilateral 06/02/2017   Procedure: HYDROCELECTOMY ADULT;  Surgeon: Ceasar Mons, MD;  [facility];  Service: Urology;  Laterality: Bilateral;  ONLY NEEDS 45 MIN   INGUINAL HERNIA REPAIR     RIGHT GROIN   INTRAVASCULAR IMAGING/OCT N/A 05/22/2020   Procedure: INTRAVASCULAR IMAGING/OCT;  Surgeon: Jettie Booze, MD;  Location: [facility];  Service:  Cardiovascular;  Laterality: N/A;   INTRAVASCULAR ULTRASOUND/IVUS N/A 02/09/2020   Procedure: Intravascular Ultrasound/IVUS;  Surgeon: Wellington Hampshire, MD;  Location: [place] [facility];  Service: Cardiovascular;  Laterality: N/A;   KNEE ARTHROSCOPY Left 05/23/2019   Procedure: LEFT KNEE ARTHROSCOPY AND DEBRIDEMENT PARTIAL MEDIAL MENISECTOMY;  Surgeon: Newt Minion, MD;  Location: [place];  Service: Orthopedics;  Laterality: Left;   LEFT HEART CATH AND CORONARY ANGIOGRAPHY N/A 02/07/2020   Procedure: LEFT HEART CATH AND CORONARY ANGIOGRAPHY;  Surgeon: Leonie Man, MD;  Location: [place] [facility];  Service: Cardiovascular;  Laterality: N/A;   LEFT HEART CATH AND CORONARY ANGIOGRAPHY N/A 05/22/2020   Procedure: LEFT HEART CATH AND CORONARY ANGIOGRAPHY;  Surgeon: Jettie Booze, MD;  Location: [place] [facility] [facility];  Service: Cardiovascular;  Laterality: N/A;   LEFT  HEART CATH AND CORONARY ANGIOGRAPHY N/A 08/15/2020   Procedure: LEFT HEART CATH AND CORONARY ANGIOGRAPHY;  Surgeon: Lorretta Harp, MD;  Location: [place] [facility];  Service: Cardiovascular;  Laterality: N/A;   TEE WITHOUT CARDIOVERSION N/A 08/21/2020   Procedure: TRANSESOPHAGEAL ECHOCARDIOGRAM (TEE);  Surgeon: Melrose Nakayama, MD;  Location: [place];  Service: Open Heart Surgery;  Laterality: N/A;   TRIGGER FINGER RELEASE Right 01/08/2015   Procedure: RELEASE TRIGGER FINGER/A-1 PULLEY RIGHT MIDDLE FINGER;  Surgeon: Daryll Brod, MD;  Location: [place];  Service: Orthopedics;  Laterality: Right;    Family History  Problem Relation Age of Onset   Hyperlipidemia Mother    Hypertension Mother    Hyperlipidemia Father    Hypertension Father    Diabetes Father    Dementia Father    Diabetes Maternal Grandfather       Controlled substance contract: n/a     Review of Systems  Constitutional:  Negative for diaphoresis.  Eyes:  Negative for pain.  Respiratory:  Negative for  shortness of breath.   Cardiovascular:  Negative for chest pain, palpitations and leg swelling.  Gastrointestinal:  Negative for abdominal pain.  Endocrine: Negative for polydipsia.  Skin:  Negative for rash.  Neurological:  Negative for dizziness, weakness and headaches.  Hematological:  Does not bruise/bleed easily.  All other systems reviewed and are negative.     Objective:   Physical Exam Vitals and nursing note reviewed.  Constitutional:      Appearance: Normal appearance. He is well-developed.  HENT:     Head: Normocephalic.     Nose: Nose normal.     Mouth/Throat:     Mouth: Mucous membranes are moist.     Pharynx: Oropharynx is clear.  Eyes:     Pupils: Pupils are equal, round, and reactive to light.  Neck:     Thyroid: No thyroid mass or thyromegaly.     Vascular: No carotid bruit or JVD.     Trachea: Phonation normal.  Cardiovascular:     Rate and Rhythm: Normal rate and regular rhythm.     Heart sounds: Murmur (3/6) heard.  Pulmonary:     Effort: Pulmonary effort is normal. No respiratory distress.     Breath sounds: Normal breath sounds.  Abdominal:     General: Bowel sounds are normal.     Palpations: Abdomen is soft.     Tenderness: There is no abdominal tenderness.  Musculoskeletal:        General: Normal range of motion.     Cervical back: Normal range of motion and neck supple.  Lymphadenopathy:     Cervical: No cervical adenopathy.  Skin:    General: Skin is warm and dry.  Neurological:     Mental Status: He is alert and oriented to person, place, and time.  Psychiatric:        Behavior: Behavior normal.        Thought Content: Thought content normal.        Judgment: Judgment normal.    BP 136/82    Pulse 78    Temp (!) 97.3 F (36.3 C) (Temporal)    Resp 20    Ht '5\' 9"'  (1.753 m)    Wt 249 lb (112.9 kg)    SpO2 95%    BMI 36.77 kg/m   HGBA1c 8.1      Assessment & Plan:  [name] [name] comes in today with chief complaint of Medical  Management of Chronic  Issues   Diagnosis and orders addressed:  1. Essential hypertension, benign Low sodium diet - CBC with Differential/Platelet - CMP14+EGFR - lisinopril (ZESTRIL) 2.5 MG tablet; Take 1 tablet (2.5 mg total) by mouth daily.  Dispense: 90 tablet; Refill: 2  2. Hyperlipidemia associated with type 2 diabetes mellitus (HCC) Low fat diet - Lipid panel - atorvastatin (LIPITOR) 80 MG tablet; Take 1 tablet (80 mg total) by mouth daily.  Dispense: 90 tablet; Refill: 3  3. Coronary artery disease involving native coronary artery of native heart without angina pectoris Keep follow up with cardiology - carvedilol (COREG) 12.5 MG tablet; TAKE 1 & 1/2 TABLET TWICE DAILY WITH A MEAL.  Dispense: 135 tablet; Refill: 1 - furosemide (LASIX) 20 MG tablet; Take 20 mg daily as needed for leg swelling  Dispense: 90 tablet; Refill: 3 - isosorbide mononitrate (IMDUR) 30 MG 24 hr tablet; Take 0.5 tablets (15 mg total) by mouth every evening.  Dispense: 45 tablet; Refill: 3  4. Gastroesophageal reflux disease without esophagitis Avoid spicy foods Do not eat 2 hours prior to bedtime  - pantoprazole (PROTONIX) 40 MG tablet; Take 1 tablet (40 mg total) by mouth daily as needed (for acid reflux).  Dispense: 90 tablet; Refill: 3  5. Type 2 diabetes mellitus with complication, with long-term current use of insulin (HCC) Stricter carb counting Increased ozempic to 69m daily - Bayer DCA Hb A1c Waived - Insulin Aspart FlexPen (NOVOLOG) 100 UNIT/ML; Inject 45-75u at breakfast and dinner  Dispense: 15 mL; Refill: 0 - Semaglutide, 1 MG/DOSE, (OZEMPIC, 1 MG/DOSE,) 2 MG/1.5ML SOPN; Inject 1 mg into the skin once a week.  Dispense: 1.5 mL; Refill: 5 - metFORMIN (GLUCOPHAGE) 500 MG tablet; Take 2 tablets (1,000 mg total) by mouth 2 (two) times daily.  Dispense: 120 tablet; Refill: 2  6. Benign prostatic hyperplasia with urinary hesitancy - PSA, total and free - tamsulosin (FLOMAX) 0.4 MG CAPS capsule;  Take 1 capsule (0.4 mg total) by mouth at bedtime.  Dispense: 90 capsule; Refill: 1  7. BMI 36.0-36.9,adult Discussed diet and exercise for person with BMI >25 Will recheck weight in 3-6 months     Labs pending Health Maintenance reviewed Diet and exercise encouraged  Follow up plan: 3 months   Mary-Margaret MHassell Done FNP

## 2021-05-10 LAB — CBC WITH DIFFERENTIAL/PLATELET
Basophils Absolute: 0 10*3/uL (ref 0.0–0.2)
Basos: 0 %
EOS (ABSOLUTE): 0.2 10*3/uL (ref 0.0–0.4)
Eos: 3 %
Hematocrit: 37.3 % — ABNORMAL LOW (ref 37.5–51.0)
Hemoglobin: 12.3 g/dL — ABNORMAL LOW (ref 13.0–17.7)
Immature Grans (Abs): 0 10*3/uL (ref 0.0–0.1)
Immature Granulocytes: 0 %
Lymphocytes Absolute: 1.9 10*3/uL (ref 0.7–3.1)
Lymphs: 27 %
MCH: 26.5 pg — ABNORMAL LOW (ref 26.6–33.0)
MCHC: 33 g/dL (ref 31.5–35.7)
MCV: 80 fL (ref 79–97)
Monocytes Absolute: 0.9 10*3/uL (ref 0.1–0.9)
Monocytes: 13 %
Neutrophils Absolute: 3.9 10*3/uL (ref 1.4–7.0)
Neutrophils: 57 %
Platelets: 265 10*3/uL (ref 150–450)
RBC: 4.64 x10E6/uL (ref 4.14–5.80)
RDW: 15.3 % (ref 11.6–15.4)
WBC: 6.9 10*3/uL (ref 3.4–10.8)

## 2021-05-10 LAB — CMP14+EGFR
ALT: 20 IU/L (ref 0–44)
AST: 16 IU/L (ref 0–40)
Albumin/Globulin Ratio: 1.8 (ref 1.2–2.2)
Albumin: 4.4 g/dL (ref 3.8–4.8)
Alkaline Phosphatase: 65 IU/L (ref 44–121)
BUN/Creatinine Ratio: 21 (ref 10–24)
BUN: 22 mg/dL (ref 8–27)
Bilirubin Total: 0.4 mg/dL (ref 0.0–1.2)
CO2: 24 mmol/L (ref 20–29)
Calcium: 9.1 mg/dL (ref 8.6–10.2)
Chloride: 102 mmol/L (ref 96–106)
Creatinine, Ser: 1.03 mg/dL (ref 0.76–1.27)
Globulin, Total: 2.5 g/dL (ref 1.5–4.5)
Glucose: 135 mg/dL — ABNORMAL HIGH (ref 70–99)
Potassium: 4.7 mmol/L (ref 3.5–5.2)
Sodium: 143 mmol/L (ref 134–144)
Total Protein: 6.9 g/dL (ref 6.0–8.5)
eGFR: 80 mL/min/{1.73_m2} (ref >59)

## 2021-05-10 LAB — LIPID PANEL
Chol/HDL Ratio: 3.3 ratio (ref 0.0–5.0)
Cholesterol, Total: 87 mg/dL — ABNORMAL LOW (ref 100–199)
HDL: 26 mg/dL — ABNORMAL LOW (ref >39)
LDL Chol Calc (NIH): 37 mg/dL (ref 0–99)
Triglycerides: 133 mg/dL (ref 0–149)
VLDL Cholesterol Cal: 24 mg/dL (ref 5–40)

## 2021-05-10 LAB — PSA, TOTAL AND FREE
PSA, Free Pct: 17.2 %
PSA, Free: 1.72 ng/mL
Prostate Specific Ag, Serum: 10 ng/mL — ABNORMAL HIGH (ref 0.0–4.0)

## 2021-05-15 DIAGNOSIS — R972 Elevated prostate specific antigen [PSA]: Secondary | ICD-10-CM | POA: Diagnosis not present

## 2021-05-22 ENCOUNTER — Other Ambulatory Visit: Payer: Self-pay | Admitting: Nurse Practitioner

## 2021-05-22 DIAGNOSIS — E1159 Type 2 diabetes mellitus with other circulatory complications: Secondary | ICD-10-CM

## 2021-05-22 DIAGNOSIS — Z794 Long term (current) use of insulin: Secondary | ICD-10-CM

## 2021-05-27 ENCOUNTER — Other Ambulatory Visit: Payer: Self-pay | Admitting: Nurse Practitioner

## 2021-06-03 ENCOUNTER — Other Ambulatory Visit: Payer: Self-pay | Admitting: Cardiology

## 2021-06-03 DIAGNOSIS — E1169 Type 2 diabetes mellitus with other specified complication: Secondary | ICD-10-CM

## 2021-06-03 DIAGNOSIS — E785 Hyperlipidemia, unspecified: Secondary | ICD-10-CM

## 2021-06-04 ENCOUNTER — Other Ambulatory Visit: Payer: Self-pay | Admitting: Nurse Practitioner

## 2021-06-04 DIAGNOSIS — E118 Type 2 diabetes mellitus with unspecified complications: Secondary | ICD-10-CM

## 2021-06-05 ENCOUNTER — Other Ambulatory Visit: Payer: Self-pay | Admitting: Cardiology

## 2021-06-06 ENCOUNTER — Other Ambulatory Visit: Payer: Self-pay

## 2021-06-06 MED ORDER — NITROGLYCERIN 0.4 MG SL SUBL: 0.4000 mg | SUBLINGUAL_TABLET | SUBLINGUAL | 3 refills | Status: DC | PRN

## 2021-06-19 ENCOUNTER — Other Ambulatory Visit: Payer: Self-pay | Admitting: Nurse Practitioner

## 2021-06-19 DIAGNOSIS — E1159 Type 2 diabetes mellitus with other circulatory complications: Secondary | ICD-10-CM

## 2021-06-20 ENCOUNTER — Other Ambulatory Visit: Payer: Self-pay | Admitting: Nurse Practitioner

## 2021-06-20 DIAGNOSIS — E118 Type 2 diabetes mellitus with unspecified complications: Secondary | ICD-10-CM

## 2021-06-23 DIAGNOSIS — R972 Elevated prostate specific antigen [PSA]: Secondary | ICD-10-CM | POA: Diagnosis not present

## 2021-06-27 ENCOUNTER — Other Ambulatory Visit: Payer: Self-pay | Admitting: Nurse Practitioner

## 2021-06-27 DIAGNOSIS — Z794 Long term (current) use of insulin: Secondary | ICD-10-CM

## 2021-07-03 ENCOUNTER — Other Ambulatory Visit: Payer: Self-pay | Admitting: Nurse Practitioner

## 2021-07-08 ENCOUNTER — Telehealth: Payer: Self-pay | Admitting: *Deleted

## 2021-07-08 DIAGNOSIS — Z794 Long term (current) use of insulin: Secondary | ICD-10-CM

## 2021-07-08 MED ORDER — INSULIN GLARGINE SOLOSTAR 100 UNIT/ML ~~LOC~~ SOPN: PEN_INJECTOR | SUBCUTANEOUS | 0 refills | Status: DC

## 2021-07-08 NOTE — Telephone Encounter (Signed)
Fax came in from [facility] stating that pts INSULIN GLARGINE SOLOSTAR U100 is no longer covered by pts plan.  ? ?Alternatives covered:  ? ?Lantus U-100 insulin vial ?Lantus solostar U-100 ?Levemir Flextouch pen ?Levemir vial  ?Tresiba U-100 vial/solution ?Tyler Aas flextouch pen  ? ? ?

## 2021-07-14 ENCOUNTER — Other Ambulatory Visit: Payer: Self-pay | Admitting: Nurse Practitioner

## 2021-07-14 DIAGNOSIS — Z794 Long term (current) use of insulin: Secondary | ICD-10-CM

## 2021-07-14 MED ORDER — INSULIN GLARGINE SOLOSTAR 100 UNIT/ML ~~LOC~~ SOPN: PEN_INJECTOR | SUBCUTANEOUS | 1 refills | Status: DC

## 2021-07-23 ENCOUNTER — Ambulatory Visit: Payer: Medicare PPO

## 2021-07-23 ENCOUNTER — Ambulatory Visit (INDEPENDENT_AMBULATORY_CARE_PROVIDER_SITE_OTHER): Payer: Medicare PPO

## 2021-07-23 VITALS — Ht 69.0 in | Wt 240.0 lb

## 2021-07-23 DIAGNOSIS — Z0000 Encounter for general adult medical examination without abnormal findings: Secondary | ICD-10-CM

## 2021-07-23 NOTE — Progress Notes (Signed)
? ?Subjective:  ? [name] [name] is a 66 y.o. male who presents for Medicare Annual/Subsequent preventive examination. ?Virtual Visit via Telephone Note ? ?I connected with  [name] [name] on 07/23/21 at  8:15 AM EDT by telephone and verified that I am speaking with the correct person using two identifiers. ? ?Location: ?Patient: HOME ?Provider: [facility] ?Persons participating in the virtual visit: patient/Nurse Health Advisor ?  ?I discussed the limitations, risks, security and privacy concerns of performing an evaluation and management service by telephone and the availability of in person appointments. The patient expressed understanding and agreed to proceed. ? ?Interactive audio and video telecommunications were attempted between this nurse and patient, however failed, due to patient having technical difficulties OR patient did not have access to video capability.  We continued and completed visit with audio only. ? ?Some vital signs may be absent or patient reported.  ? ?Chriss Driver, LPN ? ?Review of Systems    ? ?Cardiac Risk Factors include: advanced age (>32mn, >>10women);hypertension;diabetes mellitus;dyslipidemia;male gender;sedentary lifestyle;obesity (BMI >30kg/m2) ? ?   ?Objective:  ?  ?Today's Vitals  ? 07/23/21 0816  ?Weight: 240 lb (108.9 kg)  ?Height: 5' 9" (1.753 m)  ? ?Body mass index is 35.44 kg/m?. ? ? ?  07/23/2021  ?  8:23 AM 01/16/2021  ?  8:29 AM 08/18/2020  ?  9:42 PM 08/15/2020  ?  7:34 AM 07/22/2020  ?  8:43 AM 06/17/2020  ? 12:55 PM 05/22/2020  ?  9:04 AM  ?Advanced Directives  ?Does Patient Have a Medical Advance Directive? Yes _0  No  ?[place]        ?Copy of HAltain Chart? No - copy requested        ?Would patient like information on creating a medical advance directive? No - Patient declined No - Patient declined No - Patient declined No - Patient declined No - Patient declined Yes  (MAU/Ambulatory/Procedural Areas - Information given) No - Patient declined  ? ? ?Current Medications (verified) ?Outpatient Encounter Medications as of 07/23/2021  ?Medication Sig  ? Accu-Chek Softclix Lancets lancets USE TO CHECK BLOOD SUGAR 4 TIMES DAILY. Dx E11.8  ? aspirin EC 81 MG tablet Take 1 tablet (81 mg total) by mouth daily. Swallow whole.  ? atorvastatin (LIPITOR) 80 MG tablet Take 1 tablet (80 mg total) by mouth daily.  ? blood glucose meter kit and supplies Dispense based on patient and insurance preference. Use up to four times daily as directed. (FOR ICD-10 E10.9, E11.9).  ? carvedilol (COREG) 12.5 MG tablet TAKE 1 & 1/2 TABLET TWICE DAILY WITH A MEAL.  ? cetirizine (ZYRTEC) 10 MG tablet Take 10 mg by mouth daily.  ? CINNAMON PO Take 1 tablet by mouth daily.  ? CRANBERRY PO Take 1 tablet by mouth 2 (two) times daily.  ? furosemide (LASIX) 20 MG tablet Take 20 mg daily as needed for leg swelling  ? glucose blood (ACCU-CHEK AVIVA PLUS) test strip CHECK BLOOD SUGAR UP TO 4 TIMES A DAY.Dx E11.8  ? Insulin Aspart FlexPen (NOVOLOG) 100 UNIT/ML Inject 45 to 75 units at breakfast and dinner  ? Insulin Glargine Solostar (LANTUS) 100 UNIT/ML Solostar Pen INJECT 44 UNITS IN MORNING AND 75 UNITS AT NIGHT.  ? Insulin Pen Needle (GLOBAL EASE INJECT PEN NEEDLES) 31G X 8 MM MISC USE UP TO 8 TIMES A DAY. Dx E11.8  ? isosorbide  mononitrate (IMDUR) 30 MG 24 hr tablet Take 0.5 tablets (15 mg total) by mouth every evening.  ? lisinopril (ZESTRIL) 2.5 MG tablet Take 1 tablet (2.5 mg total) by mouth daily.  ? metFORMIN (GLUCOPHAGE) 500 MG tablet Take 2 tablets (1,000 mg total) by mouth 2 (two) times daily.  ? nitroGLYCERIN (NITROSTAT) 0.4 MG SL tablet Place 1 tablet (0.4 mg total) under the tongue every 5 (five) minutes x 3 doses as needed for chest pain.  ? pantoprazole (PROTONIX) 40 MG tablet Take 1 tablet (40 mg total) by mouth daily as needed (for acid reflux).  ? Semaglutide, 1 MG/DOSE, (OZEMPIC, 1 MG/DOSE,) 2  MG/1.5ML SOPN Inject 1 mg into the skin once a week.  ? tamsulosin (FLOMAX) 0.4 MG CAPS capsule Take 1 capsule (0.4 mg total) by mouth at bedtime.  ? ?No facility-administered encounter medications on file as of 07/23/2021.  ? ? ?Allergies (verified) ?Patient has no known allergies.  ? ?History: ?Past Medical History:  ?Diagnosis Date  ? Anxiety   ? Coronary atherosclerosis of native coronary artery   ? DES distal circumflex 2005; DES LAD/diagonal bifurcation 09/2010; DES ostial LAD and DES left PL 01/2020; CABG May 2022 (LIMA to LAD, SVG to diagonal, SVG to OM1 and OM 3)  ? Essential hypertension   ? GERD (gastroesophageal reflux disease)   ? History of kidney stones   ? Mixed hyperlipidemia   ? Myocardial infarction [facility])   ? Anterolateral with VF arrest 7/12  ? Type 2 diabetes mellitus ([place])   ? ?Past Surgical History:  ?Procedure Laterality Date  ? CARDIAC CATHETERIZATION  2012  ? CAROTID STENT    ? stents x 2   ? CHOLECYSTECTOMY N/A 12/04/2015  ? Procedure: LAPAROSCOPIC CHOLECYSTECTOMY;  Surgeon: Aviva Signs, MD;  Location: [facility];  Service: General;  Laterality: N/A;  ? CHOLECYSTECTOMY, LAPAROSCOPIC  12/05/2015  ? CORONARY ANGIOPLASTY  2012  ? STENT X 1 2012, STENT X 1 YRS BEFORE  ? CORONARY ARTERY BYPASS GRAFT N/A 08/21/2020  ? Procedure: CORONARY ARTERY BYPASS GRAFTING (CABG) X 4, USING LEFT INTERNAL MAMMARY ARTERY AND RIGHT GREATER SAPHENOUS VEIN HARVESTED ENDOSCOPICALLY. SVG TO OM1, OM3 SEQUENTIALLY, SVG TO DIAG., LIMA TO LAD;  Surgeon: Melrose Nakayama, MD;  Location: [facility];  Service: Open Heart Surgery;  Laterality: N/A;  ? CORONARY ATHERECTOMY N/A 02/09/2020  ? Procedure: CORONARY ATHERECTOMY;  Surgeon: Wellington Hampshire, MD;  Location: [place] [facility];  Service: Cardiovascular;  Laterality: N/A;  ? CORONARY STENT INTERVENTION N/A 02/09/2020  ? Procedure: CORONARY STENT INTERVENTION;  Surgeon: Wellington Hampshire, MD;  Location: [place] [facility];  Service: Cardiovascular;  Laterality: N/A;  ?  CORONARY STENT INTERVENTION N/A 05/22/2020  ? Procedure: CORONARY STENT INTERVENTION;  Surgeon: Jettie Booze, MD;  Location: [place] [facility];  Service: Cardiovascular;  Laterality: N/A;  ? ENDOVEIN HARVEST OF GREATER SAPHENOUS VEIN Right 08/21/2020  ? Procedure: ENDOVEIN HARVEST OF GREATER SAPHENOUS VEIN;  Surgeon: Melrose Nakayama, MD;  Location: [place];  Service: Open Heart Surgery;  Laterality: Right;  ? HYDROCELE EXCISION Bilateral 06/02/2017  ? Procedure: HYDROCELECTOMY ADULT;  Surgeon: Ceasar Mons, MD;  Location: [facility];  Service: Urology;  Laterality: Bilateral;  ONLY NEEDS 45 MIN  ? INGUINAL HERNIA REPAIR    ? RIGHT GROIN  ? INTRAVASCULAR IMAGING/OCT N/A 05/22/2020  ? Procedure: INTRAVASCULAR IMAGING/OCT;  Surgeon: Jettie Booze, MD;  Location: [place] [facility];  Service: Cardiovascular;  Laterality: N/A;  ? INTRAVASCULAR  ULTRASOUND/IVUS N/A 02/09/2020  ? Procedure: Intravascular Ultrasound/IVUS;  Surgeon: Wellington Hampshire, MD;  Location: [place] [facility];  Service: Cardiovascular;  Laterality: N/A;  ? KNEE ARTHROSCOPY Left 05/23/2019  ? Procedure: LEFT KNEE ARTHROSCOPY AND DEBRIDEMENT PARTIAL MEDIAL MENISECTOMY;  Surgeon: Newt Minion, MD;  Location: [place];  Service: Orthopedics;  Laterality: Left;  ? LEFT HEART CATH AND CORONARY ANGIOGRAPHY N/A 02/07/2020  ? Procedure: LEFT HEART CATH AND CORONARY ANGIOGRAPHY;  Surgeon: Leonie Man, MD;  Location: [place] [facility];  Service: Cardiovascular;  Laterality: N/A;  ? LEFT HEART CATH AND CORONARY ANGIOGRAPHY N/A 05/22/2020  ? Procedure: LEFT HEART CATH AND CORONARY ANGIOGRAPHY;  Surgeon: Jettie Booze, MD;  Location: [place] [facility];  Service: Cardiovascular;  Laterality: N/A;  ? LEFT HEART CATH AND CORONARY ANGIOGRAPHY N/A 08/15/2020  ? Procedure: LEFT HEART CATH AND CORONARY ANGIOGRAPHY;  Surgeon: Lorretta Harp, MD;  Location: [place] [facility];  Service: Cardiovascular;  Laterality:  N/A;  ? TEE WITHOUT CARDIOVERSION N/A 08/21/2020  ? Procedure: TRANSESOPHAGEAL ECHOCARDIOGRAM (TEE);  Surgeon: Melrose Nakayama, MD;  Location: [place];  Service: Open Heart Surgery;  Laterality: N/A;  ? TRIGGER FINGER RE

## 2021-07-23 NOTE — Patient Instructions (Addendum)
[name] [name] , ?Thank you for taking time to come for your Medicare Wellness Visit. I appreciate your ongoing commitment to your health goals. Please review the following plan we discussed and let me know if I can assist you in the future.  ? ?Screening recommendations/referrals: ?Colonoscopy: Cologuard 08/16/19 Repeat every 3 years. ? ?Recommended yearly ophthalmology/optometry visit for glaucoma screening and checkup ?Recommended yearly dental visit for hygiene and checkup ? ?Vaccinations: ?Influenza vaccine: Done 11/28/2020 Repeat annually ? ?Pneumococcal vaccine: Done 11/06/2020. ?Tdap vaccine: Due Repeat in 10 years ? ?Shingles vaccine: Done 02/06/2021 and 11/06/2020   ?Covid-19: Done 05/06/2019, 05/27/2019, 12/25/2019 and 08/02/2020 ? ?Advanced directives: Please bring a copy of your health care power of attorney and living will to the office to be added to your chart at your convenience. ? ? ?Conditions/risks identified: Aim for 30 minutes of exercise or brisk walking, 6-8 glasses of water, and 5 servings of fruits and vegetables each day. ?KEEP UP THE GOOD WORK!! ? ?Next appointment: Follow up in one year for your annual wellness visit. 07/29/2022 @ 8:15 AM.  ? ?Preventive Care 56 Years and Older, Male ? ?Preventive care refers to lifestyle choices and visits with your health care provider that can promote health and wellness. ?What does preventive care include? ?A yearly physical exam. This is also called an annual well check. ?Dental exams once or twice a year. ?Routine eye exams. Ask your health care provider how often you should have your eyes checked. ?Personal lifestyle choices, including: ?Daily care of your teeth and gums. ?Regular physical activity. ?Eating a healthy diet. ?Avoiding tobacco and drug use. ?Limiting alcohol use. ?Practicing safe sex. ?Taking low doses of aspirin every day. ?Taking vitamin and mineral supplements as recommended by your health care provider. ?What happens during an annual well  check? ?The services and screenings done by your health care provider during your annual well check will depend on your age, overall health, lifestyle risk factors, and family history of disease. ?Counseling  ?Your health care provider may ask you questions about your: ?Alcohol use. ?Tobacco use. ?Drug use. ?Emotional well-being. ?Home and relationship well-being. ?Sexual activity. ?Eating habits. ?History of falls. ?Memory and ability to understand (cognition). ?Work and work Statistician. ?Screening  ?You may have the following tests or measurements: ?Height, weight, and BMI. ?Blood pressure. ?Lipid and cholesterol levels. These may be checked every 5 years, or more frequently if you are over 47 years old. ?Skin check. ?Lung cancer screening. You may have this screening every year starting at age 74 if you have a 30-pack-year history of smoking and currently smoke or have quit within the past 15 years. ?Fecal occult blood test (FOBT) of the stool. You may have this test every year starting at age 73. ?Flexible sigmoidoscopy or colonoscopy. You may have a sigmoidoscopy every 5 years or a colonoscopy every 10 years starting at age 47. ?Prostate cancer screening. Recommendations will vary depending on your family history and other risks. ?Hepatitis C blood test. ?Hepatitis B blood test. ?Sexually transmitted disease (STD) testing. ?Diabetes screening. This is done by checking your blood sugar (glucose) after you have not eaten for a while (fasting). You may have this done every 1-3 years. ?Abdominal aortic aneurysm (AAA) screening. You may need this if you are a current or former smoker. ?Osteoporosis. You may be screened starting at age 46 if you are at high risk. ?Talk with your health care provider about your test results, treatment options, and if necessary, the need for more  tests. ?Vaccines  ?Your health care provider may recommend certain vaccines, such as: ?Influenza vaccine. This is recommended every  year. ?Tetanus, diphtheria, and acellular pertussis (Tdap, Td) vaccine. You may need a Td booster every 10 years. ?Zoster vaccine. You may need this after age 46. ?Pneumococcal 13-valent conjugate (PCV13) vaccine. One dose is recommended after age 20. ?Pneumococcal polysaccharide (PPSV23) vaccine. One dose is recommended after age 54. ?Talk to your health care provider about which screenings and vaccines you need and how often you need them. ?This information is not intended to replace advice given to you by your health care provider. Make sure you discuss any questions you have with your health care provider. ?Document Released: 04/12/2015 Document Revised: 12/04/2015 Document Reviewed: 01/15/2015 ?Elsevier Interactive Patient Education ? 2017 [employer]. ? ?Fall Prevention in the Home ?Falls can cause injuries. They can happen to people of all ages. There are many things you can do to make your home safe and to help prevent falls. ?What can I do on the outside of my home? ?Regularly fix the edges of walkways and driveways and fix any cracks. ?Remove anything that might make you trip as you walk through a door, such as a raised step or threshold. ?Trim any bushes or trees on the path to your home. ?Use bright outdoor lighting. ?Clear any walking paths of anything that might make someone trip, such as rocks or tools. ?Regularly check to see if handrails are loose or broken. Make sure that both sides of any steps have handrails. ?Any raised decks and porches should have guardrails on the edges. ?Have any leaves, snow, or ice cleared regularly. ?Use sand or salt on walking paths during winter. ?Clean up any spills in your garage right away. This includes oil or grease spills. ?What can I do in the bathroom? ?Use night lights. ?Install grab bars by the toilet and in the tub and shower. Do not use towel bars as grab bars. ?Use non-skid mats or decals in the tub or shower. ?If you need to sit down in the shower, use a  plastic, non-slip stool. ?Keep the floor dry. Clean up any water that spills on the floor as soon as it happens. ?Remove soap buildup in the tub or shower regularly. ?Attach bath mats securely with double-sided non-slip rug tape. ?Do not have throw rugs and other things on the floor that can make you trip. ?What can I do in the bedroom? ?Use night lights. ?Make sure that you have a light by your bed that is easy to reach. ?Do not use any sheets or blankets that are too big for your bed. They should not hang down onto the floor. ?Have a firm chair that has side arms. You can use this for support while you get dressed. ?Do not have throw rugs and other things on the floor that can make you trip. ?What can I do in the kitchen? ?Clean up any spills right away. ?Avoid walking on wet floors. ?Keep items that you use a lot in easy-to-reach places. ?If you need to reach something above you, use a strong step stool that has a grab bar. ?Keep electrical cords out of the way. ?Do not use floor polish or wax that makes floors slippery. If you must use wax, use non-skid floor wax. ?Do not have throw rugs and other things on the floor that can make you trip. ?What can I do with my stairs? ?Do not leave any items on the stairs. ?Make sure  that there are handrails on both sides of the stairs and use them. Fix handrails that are broken or loose. Make sure that handrails are as long as the stairways. ?Check any carpeting to make sure that it is firmly attached to the stairs. Fix any carpet that is loose or worn. ?Avoid having throw rugs at the top or bottom of the stairs. If you do have throw rugs, attach them to the floor with carpet tape. ?Make sure that you have a light switch at the top of the stairs and the bottom of the stairs. If you do not have them, ask someone to add them for you. ?What else can I do to help prevent falls? ?Wear shoes that: ?Do not have high heels. ?Have rubber bottoms. ?Are comfortable and fit you  well. ?Are closed at the toe. Do not wear sandals. ?If you use a stepladder: ?Make sure that it is fully opened. Do not climb a closed stepladder. ?Make sure that both sides of the stepladder are locked into place. ?Ask som

## 2021-08-04 ENCOUNTER — Other Ambulatory Visit: Payer: Self-pay | Admitting: Nurse Practitioner

## 2021-08-04 DIAGNOSIS — Z794 Long term (current) use of insulin: Secondary | ICD-10-CM

## 2021-08-05 ENCOUNTER — Encounter: Payer: Self-pay | Admitting: Nurse Practitioner

## 2021-08-05 ENCOUNTER — Ambulatory Visit: Payer: Medicare PPO | Admitting: Nurse Practitioner

## 2021-08-05 VITALS — BP 129/71 | HR 76 | Temp 98.1°F | Ht 69.0 in | Wt 251.0 lb

## 2021-08-05 DIAGNOSIS — J029 Acute pharyngitis, unspecified: Secondary | ICD-10-CM | POA: Diagnosis not present

## 2021-08-05 DIAGNOSIS — R051 Acute cough: Secondary | ICD-10-CM

## 2021-08-05 MED ORDER — GUAIFENESIN ER 600 MG PO TB12: 600.0000 mg | ORAL_TABLET | Freq: Two times a day (BID) | ORAL | 0 refills | Status: DC

## 2021-08-05 MED ORDER — AMOXICILLIN-POT CLAVULANATE 875-125 MG PO TABS: 1.0000 | ORAL_TABLET | Freq: Two times a day (BID) | ORAL | 0 refills | Status: DC

## 2021-08-05 NOTE — Progress Notes (Signed)
? ?Acute Office Visit ? ?Subjective:  ? ?  ?Patient ID: [name] [name], male    DOB: 1955/06/23, 66 y.o.   MRN: [id number] ? ?Chief Complaint  ?Patient presents with  ? Sinus Problem  ?  Started last Thursday   ? Nasal Congestion  ? Cough  ?  Coughing up greenish mucus  ? Shortness of Breath  ?  Started friday  ? ? ?Sinus Problem ?This is a new problem. Episode onset: in the past 3-4 days. The problem has been gradually worsening since onset. There has been no fever. Associated symptoms include congestion, coughing, headaches, shortness of breath, sinus pressure, a sore throat and swollen glands. Pertinent negatives include no chills. Past treatments include nothing.  ?Cough ?This is a new problem. The current episode started yesterday. The problem has been unchanged. The cough is Productive of purulent sputum. Associated symptoms include headaches, nasal congestion, a sore throat and shortness of breath. Pertinent negatives include no chest pain, chills, ear congestion or fever. Nothing aggravates the symptoms.  ?Shortness of Breath ?This is a new problem. The current episode started yesterday. The problem occurs intermittently. The problem has been unchanged. Associated symptoms include headaches, a sore throat and swollen glands. Pertinent negatives include no abdominal pain, chest pain or fever. Nothing aggravates the symptoms. He has tried nothing for the symptoms.  ? ? ?Review of Systems  ?Constitutional: Negative.  Negative for chills and fever.  ?HENT:  Positive for congestion, sinus pressure and sore throat.   ?Respiratory:  Positive for cough and shortness of breath.   ?Cardiovascular:  Negative for chest pain.  ?Gastrointestinal:  Negative for abdominal pain.  ?Neurological:  Positive for headaches.  ?All other systems reviewed and are negative. ? ? ?   ?Objective:  ?  ?BP 129/71   Pulse 76   Temp 98.1 ?F (36.7 ?C)   Ht '5\' 9"'$  (1.753 m)   Wt 251 lb (113.9 kg)   SpO2 95%   BMI 37.07 kg/m?  ?BP Readings  from Last 3 Encounters:  ?08/05/21 129/71  ?05/09/21 136/82  ?03/19/21 138/82  ? ?Wt Readings from Last 3 Encounters:  ?08/05/21 251 lb (113.9 kg)  ?07/23/21 240 lb (108.9 kg)  ?05/09/21 249 lb (112.9 kg)  ? ?  ? ?Physical Exam ?Vitals and nursing note reviewed.  ?Constitutional:   ?   Appearance: He is obese.  ?HENT:  ?   Head: Normocephalic.  ?   Right Ear: External ear normal.  ?   Left Ear: External ear normal.  ?   Nose: Congestion present.  ?   Mouth/Throat:  ?   Mouth: Mucous membranes are moist.  ?Eyes:  ?   Conjunctiva/sclera: Conjunctivae normal.  ?Cardiovascular:  ?   Rate and Rhythm: Normal rate and regular rhythm.  ?   Pulses: Normal pulses.  ?   Heart sounds: Normal heart sounds.  ?Pulmonary:  ?   Effort: Pulmonary effort is normal.  ?   Breath sounds: Normal breath sounds.  ?Abdominal:  ?   General: Bowel sounds are normal.  ?Skin: ?   General: Skin is warm.  ?   Findings: No rash.  ?Neurological:  ?   Mental Status: He is alert and oriented to person, place, and time.  ? ? ?No results found for any visits on 08/05/21. ? ? ?   ?Assessment & Plan:  ?Take meds as prescribed ?- Use a cool mist humidifier  ?-Use saline nose sprays frequently ?-Force fluids ?-For fever or  aches or pains- take Tylenol or ibuprofen. ?-If symptoms do not improve, she may need to be COVID tested to rule this out ?Follow up with worsening unresolved symptoms  ?Problem List Items Addressed This Visit   ?None ?Visit Diagnoses   ? ? Pharyngitis, unspecified etiology    -  Primary  ? Relevant Medications  ? amoxicillin-clavulanate (AUGMENTIN) 875-125 MG tablet  ? Other Relevant Orders  ? COVID-19, Flu A+B and RSV  ? Acute cough      ? Relevant Medications  ? guaiFENesin (MUCINEX) 600 MG 12 hr tablet  ? ?  ? ? ?Meds ordered this encounter  ?Medications  ? amoxicillin-clavulanate (AUGMENTIN) 875-125 MG tablet  ?  Sig: Take 1 tablet by mouth 2 (two) times daily.  ?  Dispense:  20 tablet  ?  Refill:  0  ?  Order Specific Question:    Supervising Provider  ?  AnswerClaretta Fraise [[id number]]  ? guaiFENesin (MUCINEX) 600 MG 12 hr tablet  ?  Sig: Take 1 tablet (600 mg total) by mouth 2 (two) times daily.  ?  Dispense:  30 tablet  ?  Refill:  0  ?  Order Specific Question:   Supervising Provider  ?  AnswerClaretta Fraise [[id number]]  ? ? ?Return if symptoms worsen or fail to improve. ? ?Ivy Lynn, NP ? ? ?

## 2021-08-05 NOTE — Patient Instructions (Signed)
Pharyngitis ? ?Pharyngitis is a sore throat (pharynx). This is when there is redness, pain, and swelling in your throat. Most of the time, this condition gets better on its own. In some cases, you may need medicine. ?What are the causes? ?An infection from a virus. ?An infection from bacteria. ?Allergies. ?What increases the risk? ?Being 45-68 years old. ?Being in crowded environments. These include: ?Daycares. ?Schools. ?Dormitories. ?Living in a place with cold temperatures outside. ?Having a weakened disease-fighting (immune) system. ?What are the signs or symptoms? ?Symptoms may vary depending on the cause. Common symptoms include: ?Sore throat. ?Tiredness (fatigue). ?Low-grade fever. ?Stuffy nose. ?Cough. ?Headache. ?Other symptoms may include: ?Glands in the neck (lymph nodes) that are swollen. ?Skin rashes. ?Film on the throat or tonsils. This can be caused by an infection from bacteria. ?Vomiting. ?Red, itchy eyes. ?Loss of appetite. ?Joint pain and muscle aches. ?Tonsils that are temporarily bigger than usual (enlarged). ?How is this treated? ?Many times, treatment is not needed. This condition usually gets better in 3-4 days without treatment. ?If the infection is caused by a bacteria, you may be need to take antibiotics. ?Follow these instructions at home: ?Medicines ?Take over-the-counter and prescription medicines only as told by your doctor. ?If you were prescribed an antibiotic medicine, take it as told by your doctor. Do not stop taking the antibiotic even if you start to feel better. ?Use throat lozenges or sprays to soothe your throat as told by your doctor. ?Children can get pharyngitis. Do not give your child aspirin. ?Managing pain ?To help with pain, try: ?Sipping warm liquids, such as: ?Broth. ?Herbal tea. ?Warm water. ?Eating or drinking cold or frozen liquids, such as frozen ice pops. ?Rinsing your mouth (gargle) with a salt water mixture 3-4 times a day or as needed. ?To make salt water,  dissolve ?-1 tsp (3-6 g) of salt in 1 cup (237 mL) of warm water. ?Do not swallow this mixture. ?Sucking on hard candy or throat lozenges. ?Putting a cool-mist humidifier in your bedroom at night to moisten the air. ?Sitting in the bathroom with the door closed for 5-10 minutes while you run hot water in the shower. ? ?General instructions ? ?Do not smoke or use any products that contain nicotine or tobacco. If you need help quitting, ask your doctor. ?Rest as told by your doctor. ?Drink enough fluid to keep your pee (urine) pale yellow. ?How is this prevented? ?Wash your hands often for at least 20 seconds with soap and water. If soap and water are not available, use hand sanitizer. ?Do not touch your eyes, nose, or mouth with unwashed hands. Wash hands after touching these areas. ?Do not share cups or eating utensils. ?Avoid close contact with people who are sick. ?Contact a doctor if: ?You have large, tender lumps in your neck. ?You have a rash. ?You cough up green, yellow-brown, or bloody spit. ?Get help right away if: ?You have a stiff neck. ?You drool or cannot swallow liquids. ?You cannot drink or take medicines without vomiting. ?You have very bad pain that does not go away with medicine. ?You have problems breathing, and it is not from a stuffy nose. ?You have new pain and swelling in your knees, ankles, wrists, or elbows. ?These symptoms may be an emergency. Get help right away. Call your local emergency services (911 in the [place] ?Do not wait to see if the symptoms will go away. ?Do not drive yourself to the hospital. ?Summary ?Pharyngitis is a sore throat (pharynx). This  is when there is redness, pain, and swelling in your throat. ?Most of the time, pharyngitis gets better on its own. Sometimes, you may need medicine. ?If you were prescribed an antibiotic medicine, take it as told by your doctor. Do not stop taking the antibiotic even if you start to feel better. ?This information is not intended to  replace advice given to you by your health care provider. Make sure you discuss any questions you have with your health care provider. ?Document Revised: 06/12/2020 Document Reviewed: 06/12/2020 ?Elsevier Patient Education ? [place]. ?Cough, Adult ?A cough helps to clear your throat and lungs. A cough may be a sign of an illness or another medical condition. ?An acute cough may only last 2-3 weeks, while a chronic cough may last 8 or more weeks. ?Many things can cause a cough. They include: ?Germs (viruses or bacteria) that attack the airway. ?Breathing in things that bother (irritate) your lungs. ?Allergies. ?Asthma. ?Mucus that runs down the back of your throat (postnasal drip). ?Smoking. ?Acid backing up from the stomach into the tube that moves food from the mouth to the stomach (gastroesophageal reflux). ?Some medicines. ?Lung problems. ?Other medical conditions, such as heart failure or a blood clot in the lung (pulmonary embolism). ?Follow these instructions at home: ?Medicines ?Take over-the-counter and prescription medicines only as told by your doctor. ?Talk with your doctor before you take medicines that stop a cough (cough suppressants). ?Lifestyle ? ?Do not smoke, and try not to be around smoke. Do not use any products that contain nicotine or tobacco, such as cigarettes, e-cigarettes, and chewing tobacco. If you need help quitting, ask your doctor. ?Drink enough fluid to keep your pee (urine) pale yellow. ?Avoid caffeine. ?Do not drink alcohol if your doctor tells you not to drink. ?General instructions ? ?Watch for any changes in your cough. Tell your doctor about them. ?Always cover your mouth when you cough. ?Stay away from things that make you cough, such as perfume, candles, campfire smoke, or cleaning products. ?If the air is dry, use a cool mist vaporizer or humidifier in your home. ?If your cough is worse at night, try using extra pillows to raise your head up higher while you  sleep. ?Rest as needed. ?Keep all follow-up visits as told by your doctor. This is important. ?Contact a doctor if: ?You have new symptoms. ?You cough up pus. ?Your cough does not get better after 2-3 weeks, or your cough gets worse. ?Cough medicine does not help your cough and you are not sleeping well. ?You have pain that gets worse or pain that is not helped with medicine. ?You have a fever. ?You are losing weight and you do not know why. ?You have night sweats. ?Get help right away if: ?You cough up blood. ?You have trouble breathing. ?Your heartbeat is very fast. ?These symptoms may be an emergency. Do not wait to see if the symptoms will go away. Get medical help right away. Call your local emergency services (911 in the [place] Do not drive yourself to the hospital. ?Summary ?A cough helps to clear your throat and lungs. Many things can cause a cough. ?Take over-the-counter and prescription medicines only as told by your doctor. ?Always cover your mouth when you cough. ?Contact a doctor if you have new symptoms or you have a cough that does not get better or gets worse. ?This information is not intended to replace advice given to you by your health care provider. Make sure you discuss  any questions you have with your health care provider. ?Document Revised: 05/05/2019 Document Reviewed: 04/04/2018 ?Elsevier Patient Education ? [place]. ? ?

## 2021-08-06 LAB — COVID-19, FLU A+B AND RSV
Influenza A, NAA: NOT DETECTED
Influenza B, NAA: NOT DETECTED
RSV, NAA: NOT DETECTED
SARS-CoV-2, NAA: NOT DETECTED

## 2021-08-09 IMAGING — DX DG CHEST 1V PORT
1 series · 1 of 1 positions shown · non-contrast
Comparison: 08/22/2020

CLINICAL DATA: Post CABG.

EXAM:
PORTABLE CHEST 1 VIEW

[chest ap]
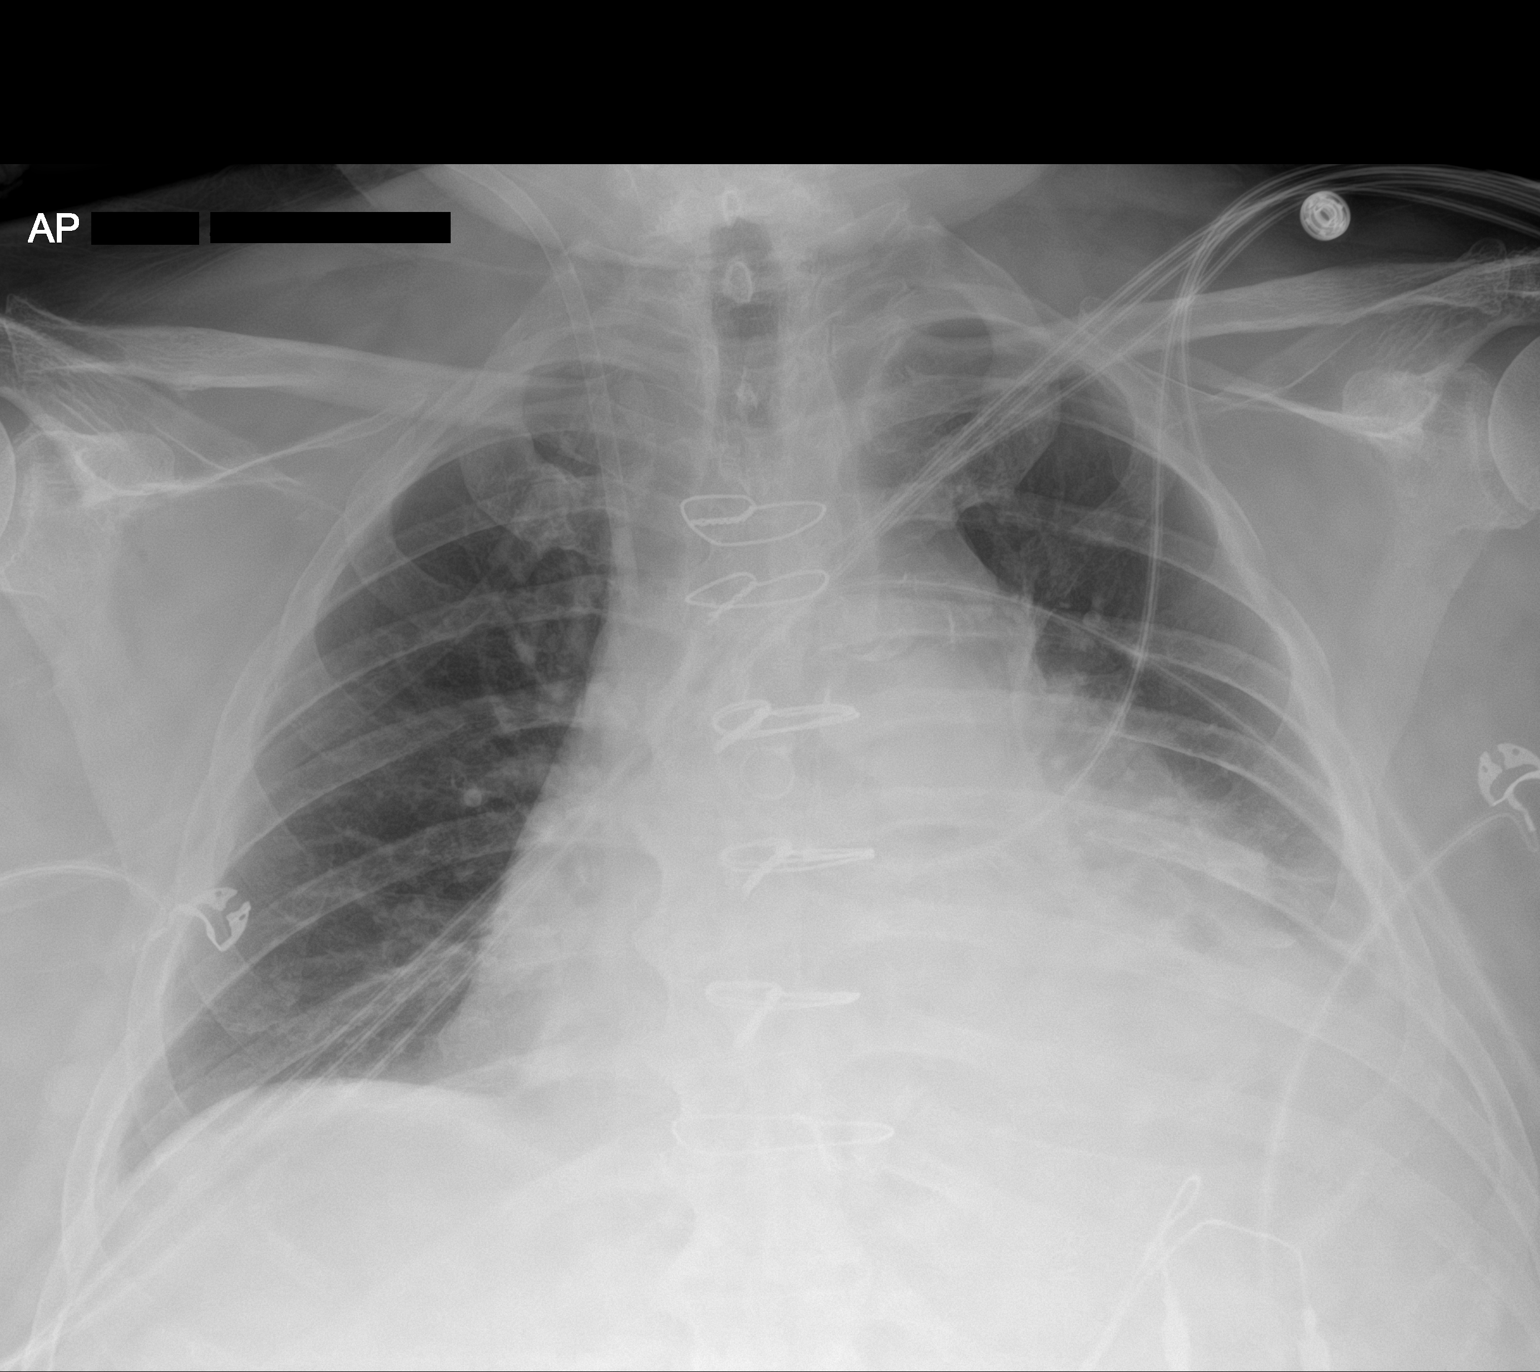

[1 of 1 positions shown; findings below may reference images not displayed]

FINDINGS: Pulmonary artery catheter was removed. Right jugular central line is
still present. Chest tubes have been removed. Increased densities at
the left lung base. Negative for pneumothorax. Cardiac silhouette
remains enlarged. Post CABG changes with median sternotomy wires.
IMPRESSION: 1. Removal of chest tubes without a pneumothorax.
2. Increased densities at the left lung base. This could represent a
combination atelectasis and pleural fluid.

## 2021-08-12 ENCOUNTER — Ambulatory Visit: Payer: Medicare PPO | Admitting: Nurse Practitioner

## 2021-08-12 ENCOUNTER — Encounter: Payer: Self-pay | Admitting: Nurse Practitioner

## 2021-08-12 VITALS — BP 128/68 | HR 76 | Temp 98.1°F | Resp 20 | Ht 69.0 in | Wt 245.0 lb

## 2021-08-12 DIAGNOSIS — K219 Gastro-esophageal reflux disease without esophagitis: Secondary | ICD-10-CM

## 2021-08-12 DIAGNOSIS — Z6836 Body mass index (BMI) 36.0-36.9, adult: Secondary | ICD-10-CM

## 2021-08-12 DIAGNOSIS — E785 Hyperlipidemia, unspecified: Secondary | ICD-10-CM

## 2021-08-12 DIAGNOSIS — I10 Essential (primary) hypertension: Secondary | ICD-10-CM | POA: Diagnosis not present

## 2021-08-12 DIAGNOSIS — E118 Type 2 diabetes mellitus with unspecified complications: Secondary | ICD-10-CM

## 2021-08-12 DIAGNOSIS — I25110 Atherosclerotic heart disease of native coronary artery with unstable angina pectoris: Secondary | ICD-10-CM

## 2021-08-12 DIAGNOSIS — N401 Enlarged prostate with lower urinary tract symptoms: Secondary | ICD-10-CM | POA: Diagnosis not present

## 2021-08-12 DIAGNOSIS — Z951 Presence of aortocoronary bypass graft: Secondary | ICD-10-CM

## 2021-08-12 DIAGNOSIS — E1169 Type 2 diabetes mellitus with other specified complication: Secondary | ICD-10-CM | POA: Diagnosis not present

## 2021-08-12 DIAGNOSIS — Z794 Long term (current) use of insulin: Secondary | ICD-10-CM

## 2021-08-12 DIAGNOSIS — R3911 Hesitancy of micturition: Secondary | ICD-10-CM

## 2021-08-12 LAB — BAYER DCA HB A1C WAIVED: HB A1C (BAYER DCA - WAIVED): 7.2 % — ABNORMAL HIGH (ref 4.8–5.6)

## 2021-08-12 NOTE — Patient Instructions (Signed)
Heart-Healthy Eating Plan Heart-healthy meal planning includes: Eating less unhealthy fats. Eating more healthy fats. Making other changes in your diet. Talk with your doctor or a diet specialist (dietitian) to create an eating plan that is right for you. What is my plan? Your doctor may recommend an eating plan that includes: Total fat: ______% or less of total calories a day. Saturated fat: ______% or less of total calories a day. Cholesterol: less than _________mg a day. What are tips for following this plan? Cooking Avoid frying your food. Try to bake, boil, grill, or broil it instead. You can also reduce fat by: Removing the skin from poultry. Removing all visible fats from meats. Steaming vegetables in water or broth. Meal planning  At meals, divide your plate into four equal parts: Fill one-half of your plate with vegetables and green salads. Fill one-fourth of your plate with whole grains. Fill one-fourth of your plate with lean protein foods. Eat 4-5 servings of vegetables per day. A serving of vegetables is: 1 cup of raw or cooked vegetables. 2 cups of raw leafy greens. Eat 4-5 servings of fruit per day. A serving of fruit is: 1 medium whole fruit.  cup of dried fruit.  cup of fresh, frozen, or canned fruit.  cup of 100% fruit juice. Eat more foods that have soluble fiber. These are apples, broccoli, carrots, beans, peas, and barley. Try to get 20-30 g of fiber per day. Eat 4-5 servings of nuts, legumes, and seeds per week: 1 serving of dried beans or legumes equals  cup after being cooked. 1 serving of nuts is  cup. 1 serving of seeds equals 1 tablespoon. General information Eat more home-cooked food. Eat less restaurant, buffet, and fast food. Limit or avoid alcohol. Limit foods that are high in starch and sugar. Avoid fried foods. Lose weight if you are overweight. Keep track of how much salt (sodium) you eat. This is important if you have high blood  pressure. Ask your doctor to tell you more about this. Try to add vegetarian meals each week. Fats Choose healthy fats. These include olive oil and canola oil, flaxseeds, walnuts, almonds, and seeds. Eat more omega-3 fats. These include salmon, mackerel, sardines, tuna, flaxseed oil, and ground flaxseeds. Try to eat fish at least 2 times each week. Check food labels. Avoid foods with trans fats or high amounts of saturated fat. Limit saturated fats. These are often found in animal products, such as meats, butter, and cream. These are also found in plant foods, such as palm oil, palm kernel oil, and coconut oil. Avoid foods with partially hydrogenated oils in them. These have trans fats. Examples are stick margarine, some tub margarines, cookies, crackers, and other baked goods. What foods can I eat? Fruits All fresh, canned (in natural juice), or frozen fruits. Vegetables Fresh or frozen vegetables (raw, steamed, roasted, or grilled). Green salads. Grains Most grains. Choose whole wheat and whole grains most of the time. Rice and pasta, including brown rice and pastas made with whole wheat. Meats and other proteins Lean, well-trimmed beef, veal, pork, and lamb. Chicken and turkey without skin. All fish and shellfish. Wild duck, rabbit, pheasant, and venison. Egg whites or low-cholesterol egg substitutes. Dried beans, peas, lentils, and tofu. Seeds and most nuts. Dairy Low-fat or nonfat cheeses, including ricotta and mozzarella. Skim or 1% milk that is liquid, powdered, or evaporated. Buttermilk that is made with low-fat milk. Nonfat or low-fat yogurt. Fats and oils Non-hydrogenated (trans-free) margarines. Vegetable oils, including   soybean, sesame, sunflower, olive, peanut, safflower, corn, canola, and cottonseed. Salad dressings or mayonnaise made with a vegetable oil. Beverages Mineral water. Coffee and tea. Diet carbonated beverages. Sweets and desserts Sherbet, gelatin, and fruit ice.  Small amounts of dark chocolate. Limit all sweets and desserts. Seasonings and condiments All seasonings and condiments. The items listed above may not be a complete list of foods and drinks you can eat. Contact a dietitian for more options. What foods should I avoid? Fruits Canned fruit in heavy syrup. Fruit in cream or butter sauce. Fried fruit. Limit coconut. Vegetables Vegetables cooked in cheese, cream, or butter sauce. Fried vegetables. Grains Breads that are made with saturated or trans fats, oils, or whole milk. Croissants. Sweet rolls. Donuts. High-fat crackers, such as cheese crackers. Meats and other proteins Fatty meats, such as hot dogs, ribs, sausage, bacon, rib-eye roast or steak. High-fat deli meats, such as salami and bologna. Caviar. Domestic duck and goose. Organ meats, such as liver. Dairy Cream, sour cream, cream cheese, and creamed cottage cheese. Whole-milk cheeses. Whole or 2% milk that is liquid, evaporated, or condensed. Whole buttermilk. Cream sauce or high-fat cheese sauce. Yogurt that is made from whole milk. Fats and oils Meat fat, or shortening. Cocoa butter, hydrogenated oils, palm oil, coconut oil, palm kernel oil. Solid fats and shortenings, including bacon fat, salt pork, lard, and butter. Nondairy cream substitutes. Salad dressings with cheese or sour cream. Beverages Regular sodas and juice drinks with added sugar. Sweets and desserts Frosting. Pudding. Cookies. Cakes. Pies. Milk chocolate or white chocolate. Buttered syrups. Full-fat ice cream or ice cream drinks. The items listed above may not be a complete list of foods and drinks to avoid. Contact a dietitian for more information. Summary Heart-healthy meal planning includes eating less unhealthy fats, eating more healthy fats, and making other changes in your diet. Eat a balanced diet. This includes fruits and vegetables, low-fat or nonfat dairy, lean protein, nuts and legumes, whole grains, and  heart-healthy oils and fats. This information is not intended to replace advice given to you by your health care provider. Make sure you discuss any questions you have with your health care provider. Document Revised: 07/25/2020 Document Reviewed: 07/25/2020 Elsevier Patient Education  2022 Elsevier Inc.  

## 2021-08-12 NOTE — Progress Notes (Signed)
? ?Subjective:  ? ? Patient ID: [name] [name], male    DOB: [birth year], 66 y.o.   MRN: [id number] ? ? ?Chief Complaint: medical management of chronic issues  ?  ? ?HPI: ? ?[name] [name] is a 66 y.o. who identifies as a male who was assigned male at birth.  ? ?Social history: ?Lives with: wife ?Work history: retired from [employer] ? ? ?Comes in today for follow up of the following chronic medical issues: ? ?1. Essential hypertension, benign ?No c/o chest pain, sob or headache. Doe snot check bloodpresure very often at home. ?BP Readings from Last 3 Encounters:  ?08/05/21 129/71  ?05/09/21 136/82  ?03/19/21 138/82  ? ? ? ?2. Coronary artery disease involving native coronary artery of native heart with unstable angina pectoris ([place]) ?3. S/P CABG x 4 ?Last saw cardiology on 03/19/21. At time of appointment patient was doing well. No change was made to plan of care. He encouraged patient to keep a check of his blood pressure at home. ? ?4. Hyperlipidemia associated with type 2 diabetes mellitus ([place]) ?Does try  to watch diet , but still does not do much exercise. ?Lab Results  ?Component Value Date  ? CHOL 87 (L) 05/09/2021  ? HDL 26 (L) 05/09/2021  ? LDLCALC 37 05/09/2021  ? TRIG 133 05/09/2021  ? CHOLHDL 3.3 05/09/2021  ? ? ? ?5. Type 2 diabetes mellitus with complication, with long-term current use of insulin ([place]) ?Fasting blood sugars have been running around 120-150. He had a stomach bug fr 2-3 weeks about a month ago and his blood sugars were really wacky then. They have since improved. ?Lab Results  ?Component Value Date  ? HGBA1C 8.1 (H) 05/09/2021  ? ? ?6. Gastroesophageal reflux disease without esophagitis ?Is on protonix daily and is doing well. ? ?7. Benign prostatic hyperplasia with urinary hesitancy ?Is on flomax and is doing well. Sees urology 1x a year. Last saw urology and they repeated his PSA and it was 2.1. ? ?Lab Results  ?Component Value Date  ? PSA1 10.0 (H) 05/09/2021  ? PSA1 2.4 08/05/2020  ? ? ? ?8.  BMI 36.0-36.9,adult ?Weight is down 6 lbs ?Wt Readings from Last 3 Encounters:  ?08/12/21 245 lb (111.1 kg)  ?08/05/21 251 lb (113.9 kg)  ?07/23/21 240 lb (108.9 kg)  ? ?BMI Readings from Last 3 Encounters:  ?08/12/21 36.18 kg/m?  ?08/05/21 37.07 kg/m?  ?07/23/21 35.44 kg/m?  ? ? ? ? ?New complaints: ?None today ? ?No Known Allergies ?Outpatient Encounter Medications as of 08/12/2021  ?Medication Sig  ? Accu-Chek Softclix Lancets lancets USE TO CHECK BLOOD SUGAR 4 TIMES DAILY. Dx E11.8  ? amoxicillin-clavulanate (AUGMENTIN) 875-125 MG tablet Take 1 tablet by mouth 2 (two) times daily.  ? aspirin EC 81 MG tablet Take 1 tablet (81 mg total) by mouth daily. Swallow whole.  ? atorvastatin (LIPITOR) 80 MG tablet Take 1 tablet (80 mg total) by mouth daily.  ? blood glucose meter kit and supplies Dispense based on patient and insurance preference. Use up to four times daily as directed. (FOR ICD-10 E10.9, E11.9).  ? carvedilol (COREG) 12.5 MG tablet TAKE 1 & 1/2 TABLET TWICE DAILY WITH A MEAL.  ? cetirizine (ZYRTEC) 10 MG tablet Take 10 mg by mouth daily.  ? CINNAMON PO Take 1 tablet by mouth daily.  ? CRANBERRY PO Take 1 tablet by mouth 2 (two) times daily.  ? furosemide (LASIX) 20 MG tablet Take 20 mg daily as needed for  leg swelling  ? glucose blood (ACCU-CHEK AVIVA PLUS) test strip CHECK BLOOD SUGAR UP TO 4 TIMES A DAY.Dx E11.8  ? guaiFENesin (MUCINEX) 600 MG 12 hr tablet Take 1 tablet (600 mg total) by mouth 2 (two) times daily.  ? Insulin Aspart FlexPen (NOVOLOG) 100 UNIT/ML INJECT 45 TO 75 UNITS AT BREAKFAST AND DINNER.  ? Insulin Glargine Solostar (LANTUS) 100 UNIT/ML Solostar Pen INJECT 44 UNITS IN MORNING AND 75 UNITS AT NIGHT.  ? Insulin Pen Needle (GLOBAL EASE INJECT PEN NEEDLES) 31G X 8 MM MISC USE UP TO 8 TIMES A DAY. Dx E11.8  ? isosorbide mononitrate (IMDUR) 30 MG 24 hr tablet Take 0.5 tablets (15 mg total) by mouth every evening.  ? lisinopril (ZESTRIL) 2.5 MG tablet Take 1 tablet (2.5 mg total) by mouth  daily.  ? metFORMIN (GLUCOPHAGE) 500 MG tablet Take 2 tablets (1,000 mg total) by mouth 2 (two) times daily.  ? nitroGLYCERIN (NITROSTAT) 0.4 MG SL tablet Place 1 tablet (0.4 mg total) under the tongue every 5 (five) minutes x 3 doses as needed for chest pain.  ? pantoprazole (PROTONIX) 40 MG tablet Take 1 tablet (40 mg total) by mouth daily as needed (for acid reflux).  ? Semaglutide, 1 MG/DOSE, (OZEMPIC, 1 MG/DOSE,) 2 MG/1.5ML SOPN Inject 1 mg into the skin once a week.  ? tamsulosin (FLOMAX) 0.4 MG CAPS capsule Take 1 capsule (0.4 mg total) by mouth at bedtime.  ? ?No facility-administered encounter medications on file as of 08/12/2021.  ? ? ?Past Surgical History:  ?Procedure Laterality Date  ? CARDIAC CATHETERIZATION  2012  ? CAROTID STENT    ? stents x 2   ? CHOLECYSTECTOMY N/A 12/04/2015  ? Procedure: LAPAROSCOPIC CHOLECYSTECTOMY;  Surgeon: Aviva Signs, MD;  Location: [facility];  Service: General;  Laterality: N/A;  ? CHOLECYSTECTOMY, LAPAROSCOPIC  12/05/2015  ? CORONARY ANGIOPLASTY  2012  ? STENT X 1 2012, STENT X 1 YRS BEFORE  ? CORONARY ARTERY BYPASS GRAFT N/A 08/21/2020  ? Procedure: CORONARY ARTERY BYPASS GRAFTING (CABG) X 4, USING LEFT INTERNAL MAMMARY ARTERY AND RIGHT GREATER SAPHENOUS VEIN HARVESTED ENDOSCOPICALLY. SVG TO OM1, OM3 SEQUENTIALLY, SVG TO DIAG., LIMA TO LAD;  Surgeon: Melrose Nakayama, MD;  Location: [facility];  Service: Open Heart Surgery;  Laterality: N/A;  ? CORONARY ATHERECTOMY N/A 02/09/2020  ? Procedure: CORONARY ATHERECTOMY;  Surgeon: Wellington Hampshire, MD;  Location: [facility];  Service: Cardiovascular;  Laterality: N/A;  ? CORONARY STENT INTERVENTION N/A 02/09/2020  ? Procedure: CORONARY STENT INTERVENTION;  Surgeon: Wellington Hampshire, MD;  Location: [facility];  Service: Cardiovascular;  Laterality: N/A;  ? CORONARY STENT INTERVENTION N/A 05/22/2020  ? Procedure: CORONARY STENT INTERVENTION;  Surgeon: Jettie Booze, MD;  Location: [facility];  Service:  Cardiovascular;  Laterality: N/A;  ? ENDOVEIN HARVEST OF GREATER SAPHENOUS VEIN Right 08/21/2020  ? Procedure: ENDOVEIN HARVEST OF GREATER SAPHENOUS VEIN;  Surgeon: Melrose Nakayama, MD;  Location: [facility];  Service: Open Heart Surgery;  Laterality: Right;  ? HYDROCELE EXCISION Bilateral 06/02/2017  ? Procedure: HYDROCELECTOMY ADULT;  Surgeon: Ceasar Mons, MD;  Location: [facility];  Service: Urology;  Laterality: Bilateral;  ONLY NEEDS 45 MIN  ? INGUINAL HERNIA REPAIR    ? RIGHT GROIN  ? INTRAVASCULAR IMAGING/OCT N/A 05/22/2020  ? Procedure: INTRAVASCULAR IMAGING/OCT;  Surgeon: Jettie Booze, MD;  Location: [facility];  Service: Cardiovascular;  Laterality: N/A;  ? INTRAVASCULAR ULTRASOUND/IVUS N/A 02/09/2020  ?  Procedure: Intravascular Ultrasound/IVUS;  Surgeon: Wellington Hampshire, MD;  Location: [facility];  Service: Cardiovascular;  Laterality: N/A;  ? KNEE ARTHROSCOPY Left 05/23/2019  ? Procedure: LEFT KNEE ARTHROSCOPY AND DEBRIDEMENT PARTIAL MEDIAL MENISECTOMY;  Surgeon: Newt Minion, MD;  Location: [facility];  Service: Orthopedics;  Laterality: Left;  ? LEFT HEART CATH AND CORONARY ANGIOGRAPHY N/A 02/07/2020  ? Procedure: LEFT HEART CATH AND CORONARY ANGIOGRAPHY;  Surgeon: Leonie Man, MD;  Location: [place];  Service: Cardiovascular;  Laterality: N/A;  ? LEFT HEART CATH AND CORONARY ANGIOGRAPHY N/A 05/22/2020  ? Procedure: LEFT HEART CATH AND CORONARY ANGIOGRAPHY;  Surgeon: Jettie Booze, MD;  Location: [place];  Service: Cardiovascular;  Laterality: N/A;  ? LEFT HEART CATH AND CORONARY ANGIOGRAPHY N/A 08/15/2020  ? Procedure: LEFT HEART CATH AND CORONARY ANGIOGRAPHY;  Surgeon: Lorretta Harp, MD;  Location: [facility];  Service: Cardiovascular;  Laterality: N/A;  ? TEE WITHOUT CARDIOVERSION N/A 08/21/2020  ? Procedure: TRANSESOPHAGEAL ECHOCARDIOGRAM (TEE);  Surgeon: Melrose Nakayama, MD;  Location: [facility];   Service: Open Heart Surgery;  Laterality: N/A;  ? TRIGGER FINGER RELEASE Right 01/08/2015  ? Procedure: RELEASE TRIGGER FINGER/A-1 PULLEY RIGHT MIDDLE FINGER;  Surgeon: Daryll Brod, MD;  Location: [facility]

## 2021-08-13 ENCOUNTER — Other Ambulatory Visit: Payer: Self-pay | Admitting: Nurse Practitioner

## 2021-08-13 DIAGNOSIS — I2510 Atherosclerotic heart disease of native coronary artery without angina pectoris: Secondary | ICD-10-CM

## 2021-08-13 LAB — CMP14+EGFR
ALT: 32 IU/L (ref 0–44)
AST: 22 IU/L (ref 0–40)
Albumin/Globulin Ratio: 1.6 (ref 1.2–2.2)
Albumin: 4.1 g/dL (ref 3.8–4.8)
Alkaline Phosphatase: 67 IU/L (ref 44–121)
BUN/Creatinine Ratio: 16 (ref 10–24)
BUN: 18 mg/dL (ref 8–27)
Bilirubin Total: 0.4 mg/dL (ref 0.0–1.2)
CO2: 25 mmol/L (ref 20–29)
Calcium: 9.2 mg/dL (ref 8.6–10.2)
Chloride: 97 mmol/L (ref 96–106)
Creatinine, Ser: 1.12 mg/dL (ref 0.76–1.27)
Globulin, Total: 2.6 g/dL (ref 1.5–4.5)
Glucose: 189 mg/dL — ABNORMAL HIGH (ref 70–99)
Potassium: 5.4 mmol/L — ABNORMAL HIGH (ref 3.5–5.2)
Sodium: 136 mmol/L (ref 134–144)
Total Protein: 6.7 g/dL (ref 6.0–8.5)
eGFR: 72 mL/min/{1.73_m2} (ref >59)

## 2021-08-13 LAB — CBC WITH DIFFERENTIAL/PLATELET
Basophils Absolute: 0 10*3/uL (ref 0.0–0.2)
Basos: 1 %
EOS (ABSOLUTE): 0.2 10*3/uL (ref 0.0–0.4)
Eos: 4 %
Hematocrit: 39.5 % (ref 37.5–51.0)
Hemoglobin: 12.7 g/dL — ABNORMAL LOW (ref 13.0–17.7)
Immature Grans (Abs): 0 10*3/uL (ref 0.0–0.1)
Immature Granulocytes: 0 %
Lymphocytes Absolute: 2 10*3/uL (ref 0.7–3.1)
Lymphs: 30 %
MCH: 26.7 pg (ref 26.6–33.0)
MCHC: 32.2 g/dL (ref 31.5–35.7)
MCV: 83 fL (ref 79–97)
Monocytes Absolute: 0.8 10*3/uL (ref 0.1–0.9)
Monocytes: 12 %
Neutrophils Absolute: 3.5 10*3/uL (ref 1.4–7.0)
Neutrophils: 53 %
Platelets: 315 10*3/uL (ref 150–450)
RBC: 4.76 x10E6/uL (ref 4.14–5.80)
RDW: 15.3 % (ref 11.6–15.4)
WBC: 6.6 10*3/uL (ref 3.4–10.8)

## 2021-08-13 LAB — LIPID PANEL
Chol/HDL Ratio: 4.5 ratio (ref 0.0–5.0)
Cholesterol, Total: 99 mg/dL — ABNORMAL LOW (ref 100–199)
HDL: 22 mg/dL — ABNORMAL LOW (ref >39)
LDL Chol Calc (NIH): 44 mg/dL (ref 0–99)
Triglycerides: 204 mg/dL — ABNORMAL HIGH (ref 0–149)
VLDL Cholesterol Cal: 33 mg/dL (ref 5–40)

## 2021-08-14 DIAGNOSIS — R972 Elevated prostate specific antigen [PSA]: Secondary | ICD-10-CM | POA: Diagnosis not present

## 2021-08-22 ENCOUNTER — Other Ambulatory Visit: Payer: Self-pay | Admitting: Nurse Practitioner

## 2021-08-22 DIAGNOSIS — I2510 Atherosclerotic heart disease of native coronary artery without angina pectoris: Secondary | ICD-10-CM

## 2021-08-22 DIAGNOSIS — I10 Essential (primary) hypertension: Secondary | ICD-10-CM

## 2021-08-22 DIAGNOSIS — E118 Type 2 diabetes mellitus with unspecified complications: Secondary | ICD-10-CM

## 2021-08-22 DIAGNOSIS — N401 Enlarged prostate with lower urinary tract symptoms: Secondary | ICD-10-CM

## 2021-08-22 MED ORDER — LISINOPRIL 2.5 MG PO TABS: 2.5000 mg | ORAL_TABLET | Freq: Every day | ORAL | 2 refills | Status: DC

## 2021-08-22 MED ORDER — INSULIN ASPART FLEXPEN 100 UNIT/ML ~~LOC~~ SOPN: PEN_INJECTOR | SUBCUTANEOUS | 2 refills | Status: DC

## 2021-08-22 MED ORDER — METFORMIN HCL 500 MG PO TABS: 1000.0000 mg | ORAL_TABLET | Freq: Two times a day (BID) | ORAL | 2 refills | Status: DC

## 2021-09-02 ENCOUNTER — Other Ambulatory Visit: Payer: Self-pay | Admitting: Nurse Practitioner

## 2021-09-02 DIAGNOSIS — N401 Enlarged prostate with lower urinary tract symptoms: Secondary | ICD-10-CM

## 2021-09-10 IMAGING — DX DG CHEST 2V
2 series · 2 of 2 positions shown · non-contrast
Comparison: 08/24/2020.

CLINICAL DATA: CABG.

EXAM:
CHEST - 2 VIEW

[dg chest 2 view (1 of 2)]
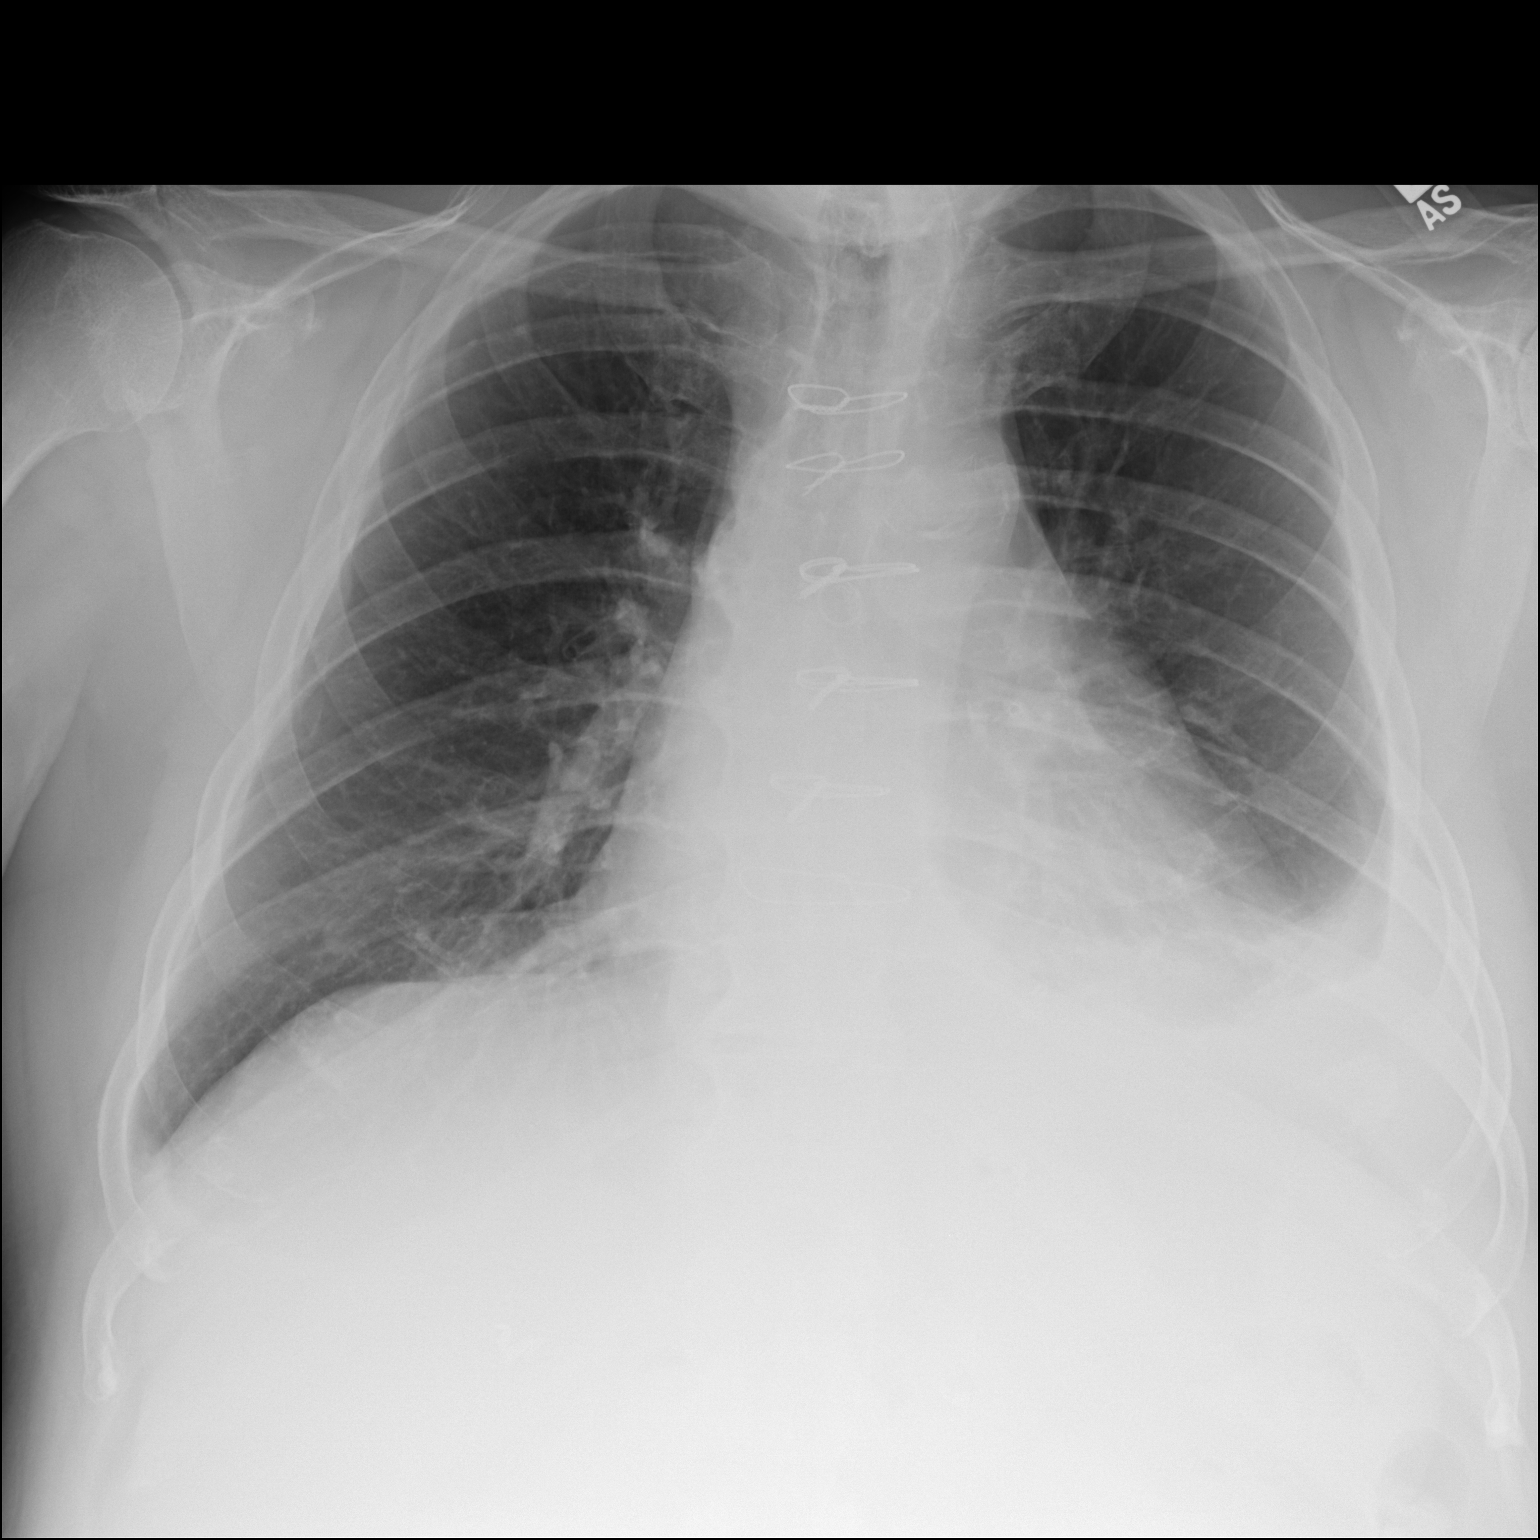

[dg chest 2 view (2 of 2)]
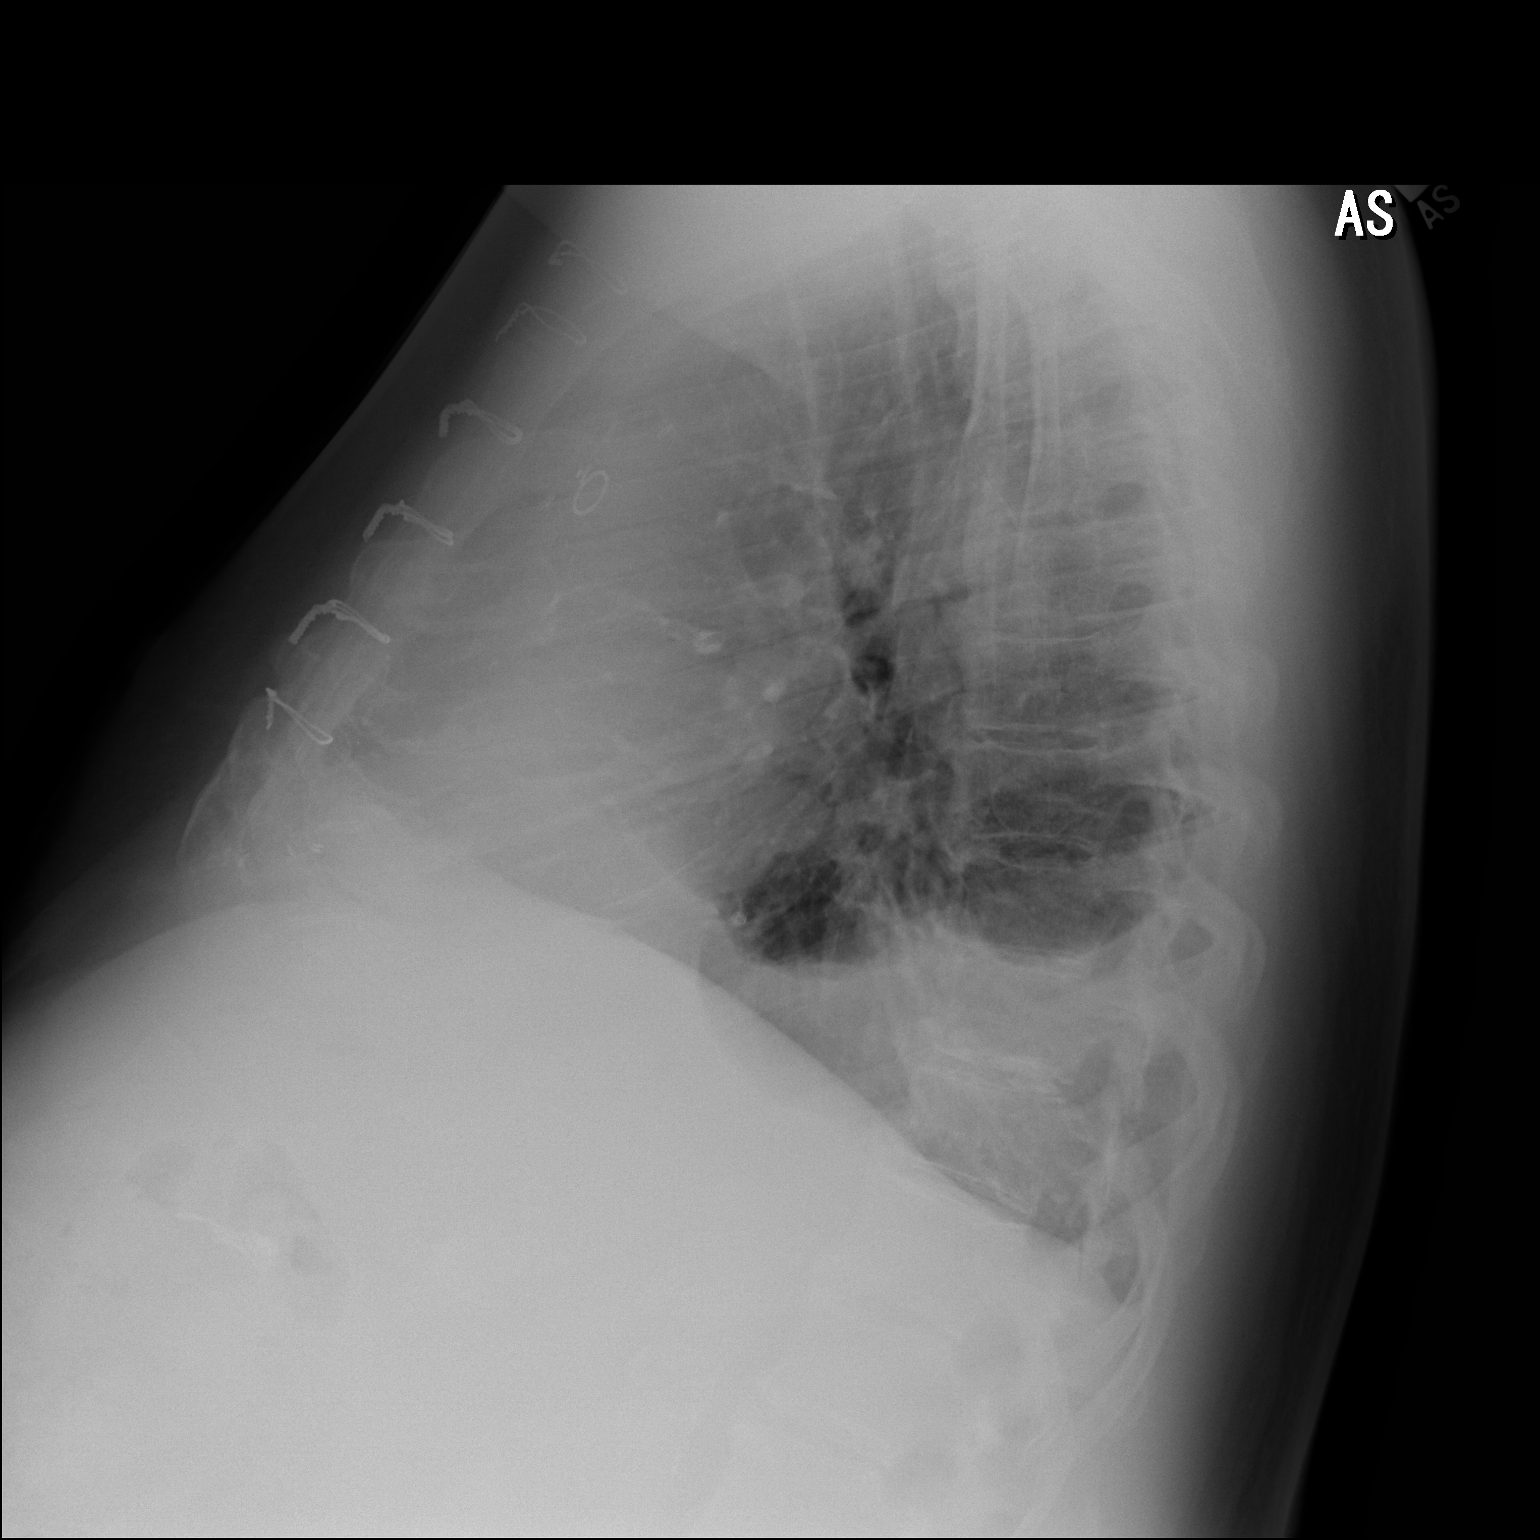

[2 of 2 positions shown; findings below may reference images not displayed]

FINDINGS: Prior CABG. Cardiomegaly. Persistent left base
atelectasis/infiltrate. Persistent small left pleural effusion. No
pneumothorax. Degenerative change thoracic spine.
IMPRESSION: 1.  Prior CABG.  Stable cardiomegaly.

2. Persistent left base atelectasis/infiltrate and small left
pleural effusion. Similar findings noted on prior exam.

## 2021-09-15 DIAGNOSIS — M65322 Trigger finger, left index finger: Secondary | ICD-10-CM | POA: Diagnosis not present

## 2021-09-15 DIAGNOSIS — M79642 Pain in left hand: Secondary | ICD-10-CM | POA: Diagnosis not present

## 2021-09-23 NOTE — Progress Notes (Signed)
Cardiology Office Note  Date: 09/25/2021   ID: [name] [name] 07-11-1955, MRN [id number]  PCP:  Chevis Pretty, FNP  Cardiologist:  Rozann Lesches, MD Electrophysiologist:  None   Chief Complaint  Patient presents with   Cardiac follow-up    History of Present Illness: [name] [name] is a 66 y.o. male last seen in December 2022.  He is here for a follow-up visit.  Overall doing well at this time.  Still with mild, occasional angina symptoms, has been doing some walking for exercise.  NYHA class II dyspnea overall.  I reviewed his medications.  He reports compliance with therapy.  Has not lost a substantial amount of weight on Ozempic, but indicates that blood sugar has been reasonable overall.  Lab work in May showed LDL 44 on Lipitor.  Past Medical History:  Diagnosis Date   Anxiety    Coronary atherosclerosis of native coronary artery    DES distal circumflex 2005; DES LAD/diagonal bifurcation 09/2010; DES ostial LAD and DES left PL 01/2020; CABG May 2022 (LIMA to LAD, SVG to diagonal, SVG to OM1 and OM 3)   Essential hypertension    GERD (gastroesophageal reflux disease)    History of kidney stones    Mixed hyperlipidemia    Myocardial infarction ([place])    Anterolateral with VF arrest 7/12   Type 2 diabetes mellitus ([place])     Past Surgical History:  Procedure Laterality Date   CARDIAC CATHETERIZATION  2012   CAROTID STENT     stents x 2    CHOLECYSTECTOMY N/A 12/04/2015   Procedure: LAPAROSCOPIC CHOLECYSTECTOMY;  Surgeon: Aviva Signs, MD;  Location: [facility];  Service: General;  Laterality: N/A;   CHOLECYSTECTOMY, LAPAROSCOPIC  12/05/2015   CORONARY ANGIOPLASTY  2012   STENT X 1 2012, STENT X 1 YRS BEFORE   CORONARY ARTERY BYPASS GRAFT N/A 08/21/2020   Procedure: CORONARY ARTERY BYPASS GRAFTING (CABG) X 4, USING LEFT INTERNAL MAMMARY ARTERY AND RIGHT GREATER SAPHENOUS VEIN HARVESTED ENDOSCOPICALLY. SVG TO OM1, OM3 SEQUENTIALLY, SVG TO DIAG., LIMA TO LAD;   Surgeon: Melrose Nakayama, MD;  Location: [facility];  Service: Open Heart Surgery;  Laterality: N/A;   CORONARY ATHERECTOMY N/A 02/09/2020   Procedure: CORONARY ATHERECTOMY;  Surgeon: Wellington Hampshire, MD;  Location: [place] [facility];  Service: Cardiovascular;  Laterality: N/A;   CORONARY STENT INTERVENTION N/A 02/09/2020   Procedure: CORONARY STENT INTERVENTION;  Surgeon: Wellington Hampshire, MD;  Location: [place] [facility];  Service: Cardiovascular;  Laterality: N/A;   CORONARY STENT INTERVENTION N/A 05/22/2020   Procedure: CORONARY STENT INTERVENTION;  Surgeon: Jettie Booze, MD;  Location: [place] [facility];  Service: Cardiovascular;  Laterality: N/A;   ENDOVEIN HARVEST OF GREATER SAPHENOUS VEIN Right 08/21/2020   Procedure: ENDOVEIN HARVEST OF GREATER SAPHENOUS VEIN;  Surgeon: Melrose Nakayama, MD;  Location: [facility];  Service: Open Heart Surgery;  Laterality: Right;   HYDROCELE EXCISION Bilateral 06/02/2017   Procedure: HYDROCELECTOMY ADULT;  Surgeon: Ceasar Mons, MD;  Location: [facility];  Service: Urology;  Laterality: Bilateral;  ONLY NEEDS 45 MIN   INGUINAL HERNIA REPAIR     RIGHT GROIN   INTRAVASCULAR IMAGING/OCT N/A 05/22/2020   Procedure: INTRAVASCULAR IMAGING/OCT;  Surgeon: Jettie Booze, MD;  Location: [place] [facility];  Service: Cardiovascular;  Laterality: N/A;   INTRAVASCULAR ULTRASOUND/IVUS N/A 02/09/2020   Procedure: Intravascular Ultrasound/IVUS;  Surgeon: Wellington Hampshire, MD;  Location: [place] [facility] [facility];  Service:  Cardiovascular;  Laterality: N/A;   KNEE ARTHROSCOPY Left 05/23/2019   Procedure: LEFT KNEE ARTHROSCOPY AND DEBRIDEMENT PARTIAL MEDIAL MENISECTOMY;  Surgeon: Newt Minion, MD;  Location: [facility];  Service: Orthopedics;  Laterality: Left;   LEFT HEART CATH AND CORONARY ANGIOGRAPHY N/A 02/07/2020   Procedure: LEFT HEART CATH AND CORONARY ANGIOGRAPHY;  Surgeon: Leonie Man, MD;  Location: [place] [facility];   Service: Cardiovascular;  Laterality: N/A;   LEFT HEART CATH AND CORONARY ANGIOGRAPHY N/A 05/22/2020   Procedure: LEFT HEART CATH AND CORONARY ANGIOGRAPHY;  Surgeon: Jettie Booze, MD;  Location: [facility] [facility];  Service: Cardiovascular;  Laterality: N/A;   LEFT HEART CATH AND CORONARY ANGIOGRAPHY N/A 08/15/2020   Procedure: LEFT HEART CATH AND CORONARY ANGIOGRAPHY;  Surgeon: Lorretta Harp, MD;  Location: [facility];  Service: Cardiovascular;  Laterality: N/A;   TEE WITHOUT CARDIOVERSION N/A 08/21/2020   Procedure: TRANSESOPHAGEAL ECHOCARDIOGRAM (TEE);  Surgeon: Melrose Nakayama, MD;  Location: [place];  Service: Open Heart Surgery;  Laterality: N/A;   TRIGGER FINGER RELEASE Right 01/08/2015   Procedure: RELEASE TRIGGER FINGER/A-1 PULLEY RIGHT MIDDLE FINGER;  Surgeon: Daryll Brod, MD;  Location: [facility];  Service: Orthopedics;  Laterality: Right;    Current Outpatient Medications  Medication Sig Dispense Refill   Accu-Chek Softclix Lancets lancets USE TO CHECK BLOOD SUGAR 4 TIMES DAILY. Dx E11.8 100 each PRN   aspirin EC 81 MG tablet Take 1 tablet (81 mg total) by mouth daily. Swallow whole. 90 tablet 3   atorvastatin (LIPITOR) 80 MG tablet Take 1 tablet (80 mg total) by mouth daily. 90 tablet 3   blood glucose meter kit and supplies Dispense based on patient and insurance preference. Use up to four times daily as directed. (FOR ICD-10 E10.9, E11.9). 1 each 0   carvedilol (COREG) 12.5 MG tablet TAKE 1 & 1/2 TABLET TWICE DAILY WITH A MEAL. 90 tablet 1   cetirizine (ZYRTEC) 10 MG tablet Take 10 mg by mouth daily.     CINNAMON PO Take 1 tablet by mouth daily.     CRANBERRY PO Take 1 tablet by mouth 2 (two) times daily.     furosemide (LASIX) 20 MG tablet Take 20 mg daily as needed for leg swelling 90 tablet 3   glucose blood (ACCU-CHEK AVIVA PLUS) test strip CHECK BLOOD SUGAR UP TO 4 TIMES A DAY.Dx E11.8 400 strip 3   Insulin Aspart FlexPen (NOVOLOG) 100  UNIT/ML Sliding scale 40-75u BID 30 mL 2   insulin glargine (LANTUS SOLOSTAR) 100 UNIT/ML Solostar Pen INJECT 44 UNITS IN MORNING AND 75 UNITS AT NIGHT. 30 mL 0   Insulin Pen Needle (GLOBAL EASE INJECT PEN NEEDLES) 31G X 8 MM MISC USE UP TO 8 TIMES A DAY. Dx E11.8 700 each 3   isosorbide mononitrate (IMDUR) 30 MG 24 hr tablet Take 0.5 tablets (15 mg total) by mouth every evening. 45 tablet 3   lisinopril (ZESTRIL) 2.5 MG tablet Take 1 tablet (2.5 mg total) by mouth daily. 90 tablet 2   metFORMIN (GLUCOPHAGE) 500 MG tablet Take 2 tablets (1,000 mg total) by mouth 2 (two) times daily. 120 tablet 2   nitroGLYCERIN (NITROSTAT) 0.4 MG SL tablet Place 1 tablet (0.4 mg total) under the tongue every 5 (five) minutes x 3 doses as needed for chest pain. 25 tablet 3   pantoprazole (PROTONIX) 40 MG tablet Take 1 tablet (40 mg total) by mouth daily as needed (for  acid reflux). 90 tablet 3   Semaglutide, 1 MG/DOSE, (OZEMPIC, 1 MG/DOSE,) 2 MG/1.5ML SOPN Inject 1 mg into the skin once a week. 1.5 mL 5   tamsulosin (FLOMAX) 0.4 MG CAPS capsule TAKE 1 CAPSULE BY MOUTH AT BEDTIME. 90 capsule 1   No current facility-administered medications for this visit.   Allergies:  Patient has no known allergies.   ROS: No palpitations or unexplained syncope.  Physical Exam: VS:  BP 122/78   Pulse 77   Ht '5\' 8"'  (1.727 m)   Wt 243 lb 3.2 oz (110.3 kg)   SpO2 95%   BMI 36.98 kg/m , BMI Body mass index is 36.98 kg/m.  Wt Readings from Last 3 Encounters:  09/25/21 243 lb 3.2 oz (110.3 kg)  08/12/21 245 lb (111.1 kg)  08/05/21 251 lb (113.9 kg)    General: Patient appears comfortable at rest. HEENT: Conjunctiva and lids normal, oropharynx clear. Neck: Supple, no elevated JVP or carotid bruits, no thyromegaly. Lungs: Nonlabored breathing at rest. Cardiac: Regular rate and rhythm, no S3 or significant systolic murmur, no pericardial rub.  ECG:  An ECG dated 11/27/2020 was personally reviewed today and demonstrated:   Sinus rhythm with prolonged PR interval, nonspecific ST-T changes.  Recent Labwork: 08/12/2021: ALT 32; AST 22; BUN 18; Creatinine, Ser 1.12; Hemoglobin 12.7; Platelets 315; Potassium 5.4; Sodium 136     Component Value Date/Time   CHOL 99 (L) 08/12/2021 1412   TRIG 204 (H) 08/12/2021 1412   HDL 22 (L) 08/12/2021 1412   CHOLHDL 4.5 08/12/2021 1412   CHOLHDL 3.7 08/16/2020 0319   VLDL 26 08/16/2020 0319   LDLCALC 44 08/12/2021 1412    Other Studies Reviewed Today:  Echocardiogram 08/16/2020:  1. Left ventricular ejection fraction, by estimation, is 60 to 65%. The  left ventricle has normal function. Left ventricular endocardial border  not optimally defined to evaluate regional wall motion. Grossly, no wall  motion abnormalities seen. There is  mild concentric left ventricular hypertrophy. Left ventricular diastolic  parameters are consistent with Grade I diastolic dysfunction (impaired  relaxation). Elevated left atrial pressure.   2. Right ventricular systolic function is normal. The right ventricular  size is normal. Tricuspid regurgitation signal is inadequate for assessing  PA pressure.   3. The mitral valve is normal in structure. No evidence of mitral valve  regurgitation.   4. The aortic valve is tricuspid. There is mild calcification of the  aortic valve. Aortic valve regurgitation is not visualized. Mild aortic  valve sclerosis is present, with no evidence of aortic valve stenosis.  Assessment and Plan:  1.  Multivessel CAD status post CABG in May 2022.  Plan to continue medical therapy, recommended walking plan for exercise.  He is on aspirin, Coreg, Lipitor, lisinopril, Imdur, and as needed nitroglycerin.  Also on Ozempic with concurrent type 2 diabetes mellitus.  2.  Mixed hyperlipidemia, doing well on Lipitor in terms of LDL control, was recently 44.  No intolerances.  3.  Essential hypertension, blood pressure is well controlled today.  No changes were  made.  Medication Adjustments/Labs and Tests Ordered: Current medicines are reviewed at length with the patient today.  Concerns regarding medicines are outlined above.   Tests Ordered: No orders of the defined types were placed in this encounter.   Medication Changes: No orders of the defined types were placed in this encounter.   Disposition:  Follow up  6 months.  Signed, Satira Sark, MD, [facility] 09/25/2021 9:39 AM  [facility] at [place], [address], [place] [address] Phone: [phone]; Fax: [phone]

## 2021-09-24 ENCOUNTER — Other Ambulatory Visit: Payer: Self-pay | Admitting: Nurse Practitioner

## 2021-09-24 DIAGNOSIS — Z794 Long term (current) use of insulin: Secondary | ICD-10-CM

## 2021-09-24 DIAGNOSIS — E1159 Type 2 diabetes mellitus with other circulatory complications: Secondary | ICD-10-CM

## 2021-09-25 ENCOUNTER — Encounter: Payer: Self-pay | Admitting: Cardiology

## 2021-09-25 ENCOUNTER — Ambulatory Visit: Payer: Medicare PPO | Admitting: Cardiology

## 2021-09-25 VITALS — BP 122/78 | HR 77 | Ht 68.0 in | Wt 243.2 lb

## 2021-09-25 DIAGNOSIS — I10 Essential (primary) hypertension: Secondary | ICD-10-CM

## 2021-09-25 DIAGNOSIS — E782 Mixed hyperlipidemia: Secondary | ICD-10-CM | POA: Diagnosis not present

## 2021-09-25 DIAGNOSIS — I25119 Atherosclerotic heart disease of native coronary artery with unspecified angina pectoris: Secondary | ICD-10-CM | POA: Diagnosis not present

## 2021-09-25 NOTE — Patient Instructions (Signed)

## 2021-10-09 ENCOUNTER — Telehealth: Payer: Self-pay | Admitting: Nurse Practitioner

## 2021-10-09 ENCOUNTER — Encounter: Payer: Self-pay | Admitting: Family Medicine

## 2021-10-09 ENCOUNTER — Telehealth: Payer: Self-pay | Admitting: Pharmacist

## 2021-10-09 ENCOUNTER — Encounter: Payer: Medicare PPO | Admitting: Family Medicine

## 2021-10-09 DIAGNOSIS — R3989 Other symptoms and signs involving the genitourinary system: Secondary | ICD-10-CM

## 2021-10-09 DIAGNOSIS — N390 Urinary tract infection, site not specified: Secondary | ICD-10-CM | POA: Diagnosis not present

## 2021-10-09 MED ORDER — SULFAMETHOXAZOLE-TRIMETHOPRIM 800-160 MG PO TABS
1.0000 | ORAL_TABLET | Freq: Two times a day (BID) | ORAL | 0 refills | Status: DC
Start: 2021-10-09 — End: 2022-02-13

## 2021-10-09 NOTE — Progress Notes (Signed)
Appointment not needed; already addressed by another provider in our office.

## 2021-10-09 NOTE — Telephone Encounter (Signed)
Micro ordered  UA abnormal

## 2021-10-09 NOTE — Telephone Encounter (Signed)
Recurrent UTI. Dysuria for 2 days with frequency. Low grade fever this morning.  Meds ordered this encounter  Medications   sulfamethoxazole-trimethoprim (BACTRIM DS) 800-160 MG tablet    Sig: Take 1 tablet by mouth 2 (two) times daily.    Dispense:  28 tablet    Refill:  0    Order Specific Question:   Supervising Provider    Answer:   Worthy Rancher [[id number]]   [place], FNP

## 2021-10-12 ENCOUNTER — Emergency Department (HOSPITAL_COMMUNITY)
Admission: EM | Admit: 2021-10-12 | Discharge: 2021-10-12 | Disposition: A | Discharge status: Home / Self Care | Payer: Medicare PPO | Attending: Emergency Medicine | Admitting: Emergency Medicine

## 2021-10-12 ENCOUNTER — Encounter (HOSPITAL_COMMUNITY): Payer: Self-pay

## 2021-10-12 ENCOUNTER — Other Ambulatory Visit: Payer: Self-pay

## 2021-10-12 DIAGNOSIS — R310 Gross hematuria: Secondary | ICD-10-CM | POA: Diagnosis not present

## 2021-10-12 DIAGNOSIS — Z951 Presence of aortocoronary bypass graft: Secondary | ICD-10-CM | POA: Insufficient documentation

## 2021-10-12 DIAGNOSIS — I10 Essential (primary) hypertension: Secondary | ICD-10-CM | POA: Diagnosis not present

## 2021-10-12 DIAGNOSIS — E119 Type 2 diabetes mellitus without complications: Secondary | ICD-10-CM | POA: Insufficient documentation

## 2021-10-12 DIAGNOSIS — Z7984 Long term (current) use of oral hypoglycemic drugs: Secondary | ICD-10-CM | POA: Diagnosis not present

## 2021-10-12 DIAGNOSIS — R300 Dysuria: Secondary | ICD-10-CM | POA: Diagnosis present

## 2021-10-12 DIAGNOSIS — Z794 Long term (current) use of insulin: Secondary | ICD-10-CM | POA: Insufficient documentation

## 2021-10-12 DIAGNOSIS — Z7982 Long term (current) use of aspirin: Secondary | ICD-10-CM | POA: Insufficient documentation

## 2021-10-12 DIAGNOSIS — Z79899 Other long term (current) drug therapy: Secondary | ICD-10-CM | POA: Diagnosis not present

## 2021-10-12 DIAGNOSIS — R350 Frequency of micturition: Secondary | ICD-10-CM | POA: Diagnosis not present

## 2021-10-12 DIAGNOSIS — N390 Urinary tract infection, site not specified: Secondary | ICD-10-CM | POA: Diagnosis not present

## 2021-10-12 LAB — COMPREHENSIVE METABOLIC PANEL
ALT: 35 U/L (ref 0–44)
AST: 28 U/L (ref 15–41)
Albumin: 3.3 g/dL — ABNORMAL LOW (ref 3.5–5.0)
Alkaline Phosphatase: 65 U/L (ref 38–126)
Anion gap: 11 (ref 5–15)
BUN: 32 mg/dL — ABNORMAL HIGH (ref 8–23)
CO2: 27 mmol/L (ref 22–32)
Calcium: 8.8 mg/dL — ABNORMAL LOW (ref 8.9–10.3)
Chloride: 96 mmol/L — ABNORMAL LOW (ref 98–111)
Creatinine, Ser: 1.45 mg/dL — ABNORMAL HIGH (ref 0.61–1.24)
GFR, Estimated: 52 mL/min — ABNORMAL LOW (ref >60)
Glucose, Bld: 149 mg/dL — ABNORMAL HIGH (ref 70–99)
Potassium: 4.2 mmol/L (ref 3.5–5.1)
Sodium: 134 mmol/L — ABNORMAL LOW (ref 135–145)
Total Bilirubin: 0.7 mg/dL (ref 0.3–1.2)
Total Protein: 6.8 g/dL (ref 6.5–8.1)

## 2021-10-12 LAB — URINALYSIS, ROUTINE W REFLEX MICROSCOPIC
Bacteria, UA: NONE SEEN
Bilirubin Urine: NEGATIVE
Glucose, UA: NEGATIVE mg/dL
Ketones, ur: NEGATIVE mg/dL
Nitrite: NEGATIVE
Protein, ur: 100 mg/dL — AB
RBC / HPF: >50 RBC/hpf — ABNORMAL HIGH (ref 0–5)
Specific Gravity, Urine: 1.023 (ref 1.005–1.030)
WBC, UA: >50 WBC/hpf — ABNORMAL HIGH (ref 0–5)
pH: 5 (ref 5.0–8.0)

## 2021-10-12 LAB — CBC WITH DIFFERENTIAL/PLATELET
Abs Immature Granulocytes: 0.04 10*3/uL (ref 0.00–0.07)
Basophils Absolute: 0 10*3/uL (ref 0.0–0.1)
Basophils Relative: 0 %
Eosinophils Absolute: 0.1 10*3/uL (ref 0.0–0.5)
Eosinophils Relative: 1 %
HCT: 39.8 % (ref 39.0–52.0)
Hemoglobin: 12.6 g/dL — ABNORMAL LOW (ref 13.0–17.0)
Immature Granulocytes: 0 %
Lymphocytes Relative: 14 %
Lymphs Abs: 1.3 10*3/uL (ref 0.7–4.0)
MCH: 26.8 pg (ref 26.0–34.0)
MCHC: 31.7 g/dL (ref 30.0–36.0)
MCV: 84.5 fL (ref 80.0–100.0)
Monocytes Absolute: 1.1 10*3/uL — ABNORMAL HIGH (ref 0.1–1.0)
Monocytes Relative: 12 %
Neutro Abs: 6.5 10*3/uL (ref 1.7–7.7)
Neutrophils Relative %: 73 %
Platelets: 265 10*3/uL (ref 150–400)
RBC: 4.71 MIL/uL (ref 4.22–5.81)
RDW: 15.9 % — ABNORMAL HIGH (ref 11.5–15.5)
WBC: 9 10*3/uL (ref 4.0–10.5)
nRBC: 0 % (ref 0.0–0.2)

## 2021-10-12 MED ORDER — SODIUM CHLORIDE 0.9 % IV SOLN
1.0000 g | Freq: Once | INTRAVENOUS | Status: AC
  Administered 2021-10-12: 1 g via INTRAVENOUS
  Filled 2021-10-12: qty 10

## 2021-10-12 MED ORDER — CEPHALEXIN 500 MG PO CAPS: 500.0000 mg | ORAL_CAPSULE | Freq: Four times a day (QID) | ORAL | 0 refills | Status: DC

## 2021-10-12 MED ORDER — SODIUM CHLORIDE 0.9 % IV BOLUS
1000.0000 mL | Freq: Once | INTRAVENOUS | Status: AC
  Administered 2021-10-12: 1000 mL via INTRAVENOUS

## 2021-10-12 NOTE — Discharge Instructions (Addendum)
You have been prescribed an antibiotic by the name of Keflex. Stop bactrim.  You have also been provided the contact information for Dr. Junious Silk, a local neurologist.  Please schedule an appointment and follow-up with him within the next 2 to 3 days for reevaluation and continued medical management.  Return to the ED for new or worsening symptoms as discussed.

## 2021-10-12 NOTE — ED Provider Notes (Signed)
[facility] EMERGENCY DEPARTMENT Provider Note   CSN: [id number] Arrival date & time: 10/12/21  1624     History  Chief Complaint  Patient presents with   Dysuria    [name] [name] is a 66 y.o. male with a Hx of essential HTN, CABG, DMT2, BPH, GERD.  Presenting today with possible worsening UTI.  Started experiencing moderate dysuria and increased urinary frequency 5 days ago, messaged his PCP, and was given Bactrim which he started on Thursday.  Felt some relief on Friday, but symptoms came back on Saturday.  Today, noted that his urine turned dark brown.  Went to urgent care who performed a urinalysis.  After reading the results, they were concerned about his liver or renal function and recommended going to the ED for further work-up.  Denies fever, chills, abdominal pain, flank pain, chest pain, shortness of breath.  Also noted 1-2 episodes of diarrhea, is unsure whether this is related.  Last UTI was about a year ago.  Hx of renal calculi years ago, but states this does not feel similar.  The history is provided by the patient and medical records.       Home Medications Prior to Admission medications   Medication Sig Start Date End Date Taking? Authorizing Provider  Accu-Chek Softclix Lancets lancets USE TO CHECK BLOOD SUGAR 4 TIMES DAILY. Dx E11.8 10/07/20   Bennie Pierini, FNP  aspirin EC 81 MG tablet Take 1 tablet (81 mg total) by mouth daily. Swallow whole. 03/19/21   Jonelle Sidle, MD  atorvastatin (LIPITOR) 80 MG tablet Take 1 tablet (80 mg total) by mouth daily. 05/09/21   Bennie Pierini, FNP  blood glucose meter kit and supplies Dispense based on patient and insurance preference. Use up to four times daily as directed. (FOR ICD-10 E10.9, E11.9). 04/26/19   Daphine Deutscher, Mary-Margaret, FNP  carvedilol (COREG) 12.5 MG tablet TAKE 1 & 1/2 TABLET TWICE DAILY WITH A MEAL. 08/14/21   Daphine Deutscher Mary-Margaret, FNP  cetirizine (ZYRTEC) 10 MG tablet Take 10 mg by mouth daily.     [provider]  CINNAMON PO Take 1 tablet by mouth daily.    [provider]  CRANBERRY PO Take 1 tablet by mouth 2 (two) times daily.    [provider]  furosemide (LASIX) 20 MG tablet Take 20 mg daily as needed for leg swelling 05/09/21   Daphine Deutscher, Mary-Margaret, FNP  glucose blood (ACCU-CHEK AVIVA PLUS) test strip CHECK BLOOD SUGAR UP TO 4 TIMES A DAY.Dx E11.8 07/03/21   Bennie Pierini, FNP  Insulin Aspart FlexPen (NOVOLOG) 100 UNIT/ML Sliding scale 40-75u BID 08/22/21   Daphine Deutscher, Mary-Margaret, FNP  insulin glargine (LANTUS SOLOSTAR) 100 UNIT/ML Solostar Pen INJECT 44 UNITS IN MORNING AND 75 UNITS AT NIGHT. 09/24/21   Daphine Deutscher, Mary-Margaret, FNP  Insulin Pen Needle (GLOBAL EASE INJECT PEN NEEDLES) 31G X 8 MM MISC USE UP TO 8 TIMES A DAY. Dx E11.8 05/27/21   Daphine Deutscher Mary-Margaret, FNP  isosorbide mononitrate (IMDUR) 30 MG 24 hr tablet Take 0.5 tablets (15 mg total) by mouth every evening. 05/09/21 09/26/22  Daphine Deutscher, Mary-Margaret, FNP  lisinopril (ZESTRIL) 2.5 MG tablet Take 1 tablet (2.5 mg total) by mouth daily. 08/22/21   Daphine Deutscher, Mary-Margaret, FNP  metFORMIN (GLUCOPHAGE) 500 MG tablet Take 2 tablets (1,000 mg total) by mouth 2 (two) times daily. 08/22/21   Daphine Deutscher, Mary-Margaret, FNP  nitroGLYCERIN (NITROSTAT) 0.4 MG SL tablet Place 1 tablet (0.4 mg total) under the tongue every 5 (five) minutes x 3 doses  as needed for chest pain. 06/06/21   Jonelle Sidle, MD  pantoprazole (PROTONIX) 40 MG tablet Take 1 tablet (40 mg total) by mouth daily as needed (for acid reflux). 05/09/21   Daphine Deutscher, Mary-Margaret, FNP  Semaglutide, 1 MG/DOSE, (OZEMPIC, 1 MG/DOSE,) 2 MG/1.5ML SOPN Inject 1 mg into the skin once a week. 05/09/21   Daphine Deutscher Mary-Margaret, FNP  sulfamethoxazole-trimethoprim (BACTRIM DS) 800-160 MG tablet Take 1 tablet by mouth 2 (two) times daily. 10/09/21   Bennie Pierini, FNP  tamsulosin (FLOMAX) 0.4 MG CAPS capsule TAKE 1 CAPSULE BY MOUTH AT BEDTIME. 09/03/21    Bennie Pierini, FNP      Allergies    Patient has no known allergies.    Review of Systems   Review of Systems  Genitourinary:  Positive for dysuria and frequency.       Dark urine color    Physical Exam Updated Vital Signs BP 121/68 (BP Location: Right Arm)   Pulse 85   Temp 98.2 F (36.8 C) (Oral)   Resp 20   Ht 5\' 8"  (1.727 m)   Wt 108.9 kg   SpO2 97%   BMI 36.49 kg/m  Physical Exam Vitals and nursing note reviewed.  Constitutional:      General: He is not in acute distress.    Appearance: Normal appearance. He is well-developed. He is not ill-appearing, toxic-appearing or diaphoretic.  HENT:     Head: Normocephalic and atraumatic.  Eyes:     Conjunctiva/sclera: Conjunctivae normal.  Cardiovascular:     Rate and Rhythm: Normal rate and regular rhythm.     Pulses: Normal pulses.     Heart sounds: No murmur heard. Pulmonary:     Effort: Pulmonary effort is normal. No respiratory distress.     Breath sounds: Normal breath sounds.  Abdominal:     Palpations: Abdomen is soft.     Tenderness: There is no abdominal tenderness.  Musculoskeletal:        General: No swelling.     Cervical back: Neck supple. No rigidity.  Skin:    General: Skin is warm and dry.     Capillary Refill: Capillary refill takes less than 2 seconds.     Coloration: Skin is not jaundiced or pale.  Neurological:     Mental Status: He is alert and oriented to person, place, and time.  Psychiatric:        Mood and Affect: Mood normal.     ED Results / Procedures / Treatments   Labs (all labs ordered are listed, but only abnormal results are displayed) Labs Reviewed  URINE CULTURE  COMPREHENSIVE METABOLIC PANEL  CBC WITH DIFFERENTIAL/PLATELET  URINALYSIS, ROUTINE W REFLEX MICROSCOPIC    EKG None  Radiology No results found.  Procedures Procedures    Medications Ordered in ED Medications - No data to display  ED Course/ Medical Decision Making/ A&P                            Medical Decision Making Amount and/or Complexity of Data Reviewed Labs: ordered.   66 y.o. male presents to the ED for concern of Dysuria   This involves an extensive number of treatment options, and is a complaint that carries with it a high risk of complications and morbidity.     Past Medical History / Co-morbidities / Social History: Hx of essential HTN, CABG, DMT2, BPH, GERD Social Determinants of Health include: Elderly  Additional History:  Internal  and external records from outside source obtained and reviewed including family medicine, which indicates Bactrim was initiated on Thursday    Lab Tests: I ordered, and personally interpreted labs.  The pertinent results include:   CBC, CMP, UA: Pending Urine culture: Pending  Imaging Studies: None  ED Course: Pt well-appearing on exam.  Nonseptic, nontoxic appearing in NAD.  Presenting with worsening dysuria and increased urinary frequency over the last few days.  Initially symptomatic 5 days ago, began Bactrim shortly after.  Noticed improvement for the first day, then symptoms became worse.  Evaluated at [facility] and was encouraged to come to the ED for further evaluation, specifically due to concern for hepatic or biliary dysfunction in addition to likely UTI. Only complaints are dysuria and increased urinary frequency.  Without flank pain, fever, abdominal pain, CVA tenderness, or chills.  Low suspicion of pyelonephritis or obstructing renal calculus at this time.  Does not meet SIRS or sepsis criteria.  UA, urine culture, basic labs pending.  Hemodynamically stable.  Disposition: 1900 care of [name] [name] transferred to [facility] at the end of my shift.  Patient case discussed at length.  Please see his/her note for further details.  Plan at time of handoff is dependent on outstanding labs.  If UA indicative of moderate-severe UTI, consider switching antibiotic therapy.  If hepatic or biliary dysfunction appreciated,  may require further investigation.  Otherwise anticipate discharge with antibiotics and Urology close follow up.  This may be altered or completely changed at the discretion of the oncoming team pending results of further workup.   This chart was dictated using voice recognition software.  Despite best efforts to proofread, errors can occur which can change the documentation meaning.         Final Clinical Impression(s) / ED Diagnoses Final diagnoses:  Urinary tract infection with hematuria, site unspecified    Rx / DC Orders ED Discharge Orders     None         Sandrea Hammond 10/12/21 1911    Benjiman Core, MD 10/13/21 1239

## 2021-10-12 NOTE — ED Provider Notes (Signed)
Care assumed from [place] at shift change, please see her note for full details, but in brief [name] [name] is a 66 y.o. male who presents with worsening urinary symptoms.  He was recently started on antibiotics for a presumed UTI by his PCP after he complained of some dysuria.  He started taking Bactrim on Thursday initially felt like symptoms were improving but then started to have a worsening of the dysuria and noticed dark discoloration to his urine.  No associated fevers or chills, nausea vomiting, abdominal pain or flank pain.  Was seen at urgent care and sent to the ED for further evaluation.  Labs and urinalysis pending at shift change, plan is to follow-up on pending blood work, anticipate discharge but may need alternate antibiotic therapy.  BP 113/63   Pulse 84   Temp 98.2 F (36.8 C) (Oral)   Resp 16   Ht '5\' 8"'$  (1.727 m)   Wt 108.9 kg   SpO2 94%   BMI 36.49 kg/m   Labs Reviewed  COMPREHENSIVE METABOLIC PANEL - Abnormal; Notable for the following components:      Result Value   Sodium 134 (*)    Chloride 96 (*)    Glucose, Bld 149 (*)    BUN 32 (*)    Creatinine, Ser 1.45 (*)    Calcium 8.8 (*)    Albumin 3.3 (*)    GFR, Estimated 52 (*)    All other components within normal limits  CBC WITH DIFFERENTIAL/PLATELET - Abnormal; Notable for the following components:   Hemoglobin 12.6 (*)    RDW 15.9 (*)    Monocytes Absolute 1.1 (*)    All other components within normal limits  URINALYSIS, ROUTINE W REFLEX MICROSCOPIC - Abnormal; Notable for the following components:   Color, Urine AMBER (*)    APPearance CLOUDY (*)    Hgb urine dipstick MODERATE (*)    Protein, ur 100 (*)    Leukocytes,Ua MODERATE (*)    RBC / HPF >50 (*)    WBC, UA >50 (*)    Non Squamous Epithelial 11-20 (*)    All other components within normal limits  URINE CULTURE     ED Course / MDM    Medical Decision Making Amount and/or Complexity of Data Reviewed Labs: ordered.  I  reviewed urine culture sent by PCP, preliminary report shows gram-negative rods, most likely E. coli.  I have personally reviewed and interpreted patient's lab work, he has no leukocytosis and stable hemoglobin, does have a slight bump in his creatinine at 1.45 with elevated BUN and mild hyponatremia and hypochloremia suggestive of dehydration.  I went and spoke with the patient regarding this and he reports he has not been drinking much to try and avoid going to the bathroom due to pain with urination.  His urinalysis does still show some signs of infection with hemoglobin and leukocytes present, no current bacteria seen but patient does have white blood cell clumps.  He has no fever and does not meet SIRS criteria and has no abdominal or flank tenderness on exam.  Given worsening of symptoms will change antibiotic therapy.  Patient given IV fluid bolus for slight increase in creatinine and IV Rocephin, will be discharged home on Keflex and instructed to follow-up closely with his primary care provider.   Patient feeling much better after fluids, has been able to urinate here in the ED without difficulty.  Will discharge home with prescription for Keflex.  At this  time there does not appear to be any evidence of an acute emergency medical condition requiring further emergent evaluation and the patient appears stable for discharge with appropriate outpatient follow up. Diagnosis and return precautions discussed with patient who verbalizes understanding and is agreeable to discharge.      Janet Berlin 10/12/21 2057    Davonna Belling, MD 10/13/21 1239

## 2021-10-12 NOTE — ED Triage Notes (Signed)
Pt states "I think I have a UTI." Pt report been given ABX by PCP. He states things got better for a little while but then got worse today. He went to [facility] but his urine was brown so they sent him to the hospital. Burning/pain on urination. Frequently going.

## 2021-10-13 ENCOUNTER — Other Ambulatory Visit: Payer: Self-pay | Admitting: Nurse Practitioner

## 2021-10-13 DIAGNOSIS — I2510 Atherosclerotic heart disease of native coronary artery without angina pectoris: Secondary | ICD-10-CM

## 2021-10-15 LAB — URINE CULTURE: Culture: 10000 — AB

## 2021-10-16 ENCOUNTER — Telehealth: Payer: Self-pay | Admitting: *Deleted

## 2021-10-16 NOTE — ED Provider Notes (Signed)
Patient presented 7/16 with uti previously treated 7/13 with uti treated op with bactrim. Treated here with keflex Urine positive esbl Plan nitrofurantoin     Pattricia Boss, MD 10/16/21 1016

## 2021-10-16 NOTE — Telephone Encounter (Signed)
Post ED Visit - Positive Culture Follow-up: Successful Patient Follow-Up  Culture assessed and recommendations reviewed by:  '[]'$  Elenor Quinones, Pharm.D. '[]'$  Heide Guile, Pharm.D., BCPS AQ-ID '[]'$  Parks Neptune, Pharm.D., BCPS '[]'$  Alycia Rossetti, Pharm.D., BCPS '[]'$  [facility], Pharm.D., BCPS, AAHIVP '[]'$  Legrand Como, Pharm.D., BCPS, AAHIVP '[]'$  Salome Arnt, PharmD, BCPS '[]'$  Johnnette Gourd, PharmD, BCPS '[]'$  Klunk Better, PharmD, BCPS '[]'$  Leeroy Cha, PharmD  Positive urine culture  '[]'$  Patient discharged without antimicrobial prescription and treatment is now indicated '[x]'$  Organism is resistant to prescribed ED discharge antimicrobial '[]'$  Patient with positive blood cultures  Changes discussed with ED provider: Pattricia Boss, MD New antibiotic prescription MacroBid '100mg'$  PO BID x 14 days Called to [facility], [place]  Contacted patient, date 10/16/2021, time [place], [place] 10/16/2021, 11:38 AM

## 2021-10-16 NOTE — Progress Notes (Signed)
ED Antimicrobial Stewardship Positive Culture Follow Up   [name] [name] is an 66 y.o. male who presented to [facility] on 10/12/2021 with a chief complaint of  Chief Complaint  Patient presents with   Dysuria    Recent Results (from the past 720 hour(s))  Urine Culture     Status: Abnormal (Preliminary result)   Collection Time: 10/09/21  1:55 PM   Specimen: Urine   UC  Result Value Ref Range Status   Urine Culture, Routine Preliminary report (A)  Preliminary   Organism ID, Bacteria Gram negative rods (A)  Preliminary    Comment: Greater than 100,000 colony forming units per mL  Urine Culture     Status: Abnormal   Collection Time: 10/12/21  5:34 PM   Specimen: Urine, Clean Catch  Result Value Ref Range Status   Specimen Description   Final    URINE, CLEAN CATCH Performed at [facility], [address] [place], [place] [address]    Special Requests   Final    NONE Performed at [facility], [address] [place], [place] [address]    Culture (A)  Final    10,000 COLONIES/mL ESCHERICHIA COLI Confirmed Extended Spectrum Beta-Lactamase Producer (ESBL).  In bloodstream infections from ESBL organisms, carbapenems are preferred over piperacillin/tazobactam. They are shown to have a lower risk of mortality.    Report Status 10/15/2021 FINAL  Final   Organism ID, Bacteria ESCHERICHIA COLI (A)  Final      Susceptibility   Escherichia coli - MIC*    AMPICILLIN >=32 RESISTANT Resistant     CEFAZOLIN >=64 RESISTANT Resistant     CEFEPIME >=32 RESISTANT Resistant     CEFTRIAXONE >=64 RESISTANT Resistant     CIPROFLOXACIN >=4 RESISTANT Resistant     GENTAMICIN >=16 RESISTANT Resistant     IMIPENEM <=0.25 SENSITIVE Sensitive     NITROFURANTOIN <=16 SENSITIVE Sensitive     TRIMETH/SULFA >=320 RESISTANT Resistant     AMPICILLIN/SULBACTAM >=32 RESISTANT Resistant     PIP/TAZO 16 SENSITIVE Sensitive     * 10,000 COLONIES/mL ESCHERICHIA COLI    '[x]'$  Treated with cephalexin, organism  resistant to prescribed antimicrobial.  New antibiotic prescription: MacroBID (nitrofurantoin) '100mg'$  by mouth twice daily x 14 days. Stop cephalexin.  ED Provider: Pattricia Boss, MD   Kaleen Mask 10/16/2021, 10:37 AM Clinical Pharmacist Monday - Friday phone -  [phone] Saturday - Sunday phone - [phone]

## 2021-10-17 LAB — URINE CULTURE

## 2021-10-25 ENCOUNTER — Other Ambulatory Visit: Payer: Self-pay | Admitting: Nurse Practitioner

## 2021-10-27 ENCOUNTER — Other Ambulatory Visit: Payer: Self-pay | Admitting: Nurse Practitioner

## 2021-10-27 DIAGNOSIS — Z794 Long term (current) use of insulin: Secondary | ICD-10-CM

## 2021-11-10 ENCOUNTER — Other Ambulatory Visit: Payer: Self-pay | Admitting: Nurse Practitioner

## 2021-11-10 ENCOUNTER — Other Ambulatory Visit (HOSPITAL_COMMUNITY): Payer: Self-pay

## 2021-11-10 DIAGNOSIS — N410 Acute prostatitis: Secondary | ICD-10-CM | POA: Diagnosis not present

## 2021-11-10 DIAGNOSIS — Z794 Long term (current) use of insulin: Secondary | ICD-10-CM

## 2021-11-10 DIAGNOSIS — R8279 Other abnormal findings on microbiological examination of urine: Secondary | ICD-10-CM | POA: Diagnosis not present

## 2021-11-10 MED ORDER — FOSFOMYCIN TROMETHAMINE 3 G PO PACK
PACK | ORAL | 0 refills | Status: DC
  Filled 2021-11-10: qty 9, 3d supply, fill #0
  Filled 2021-11-11: qty 12, 4d supply, fill #0

## 2021-11-11 ENCOUNTER — Other Ambulatory Visit (HOSPITAL_COMMUNITY): Payer: Self-pay

## 2021-11-11 ENCOUNTER — Other Ambulatory Visit: Payer: Self-pay | Admitting: Nurse Practitioner

## 2021-11-11 DIAGNOSIS — I2510 Atherosclerotic heart disease of native coronary artery without angina pectoris: Secondary | ICD-10-CM

## 2021-11-13 ENCOUNTER — Other Ambulatory Visit (HOSPITAL_COMMUNITY): Payer: Self-pay

## 2021-11-14 IMAGING — DX DG CHEST 2V
2 series · 2 of 2 positions shown · non-contrast
Comparison: October 29, 2020.

CLINICAL DATA: Pleural effusion.

EXAM:
CHEST - 2 VIEW

[chest pa]
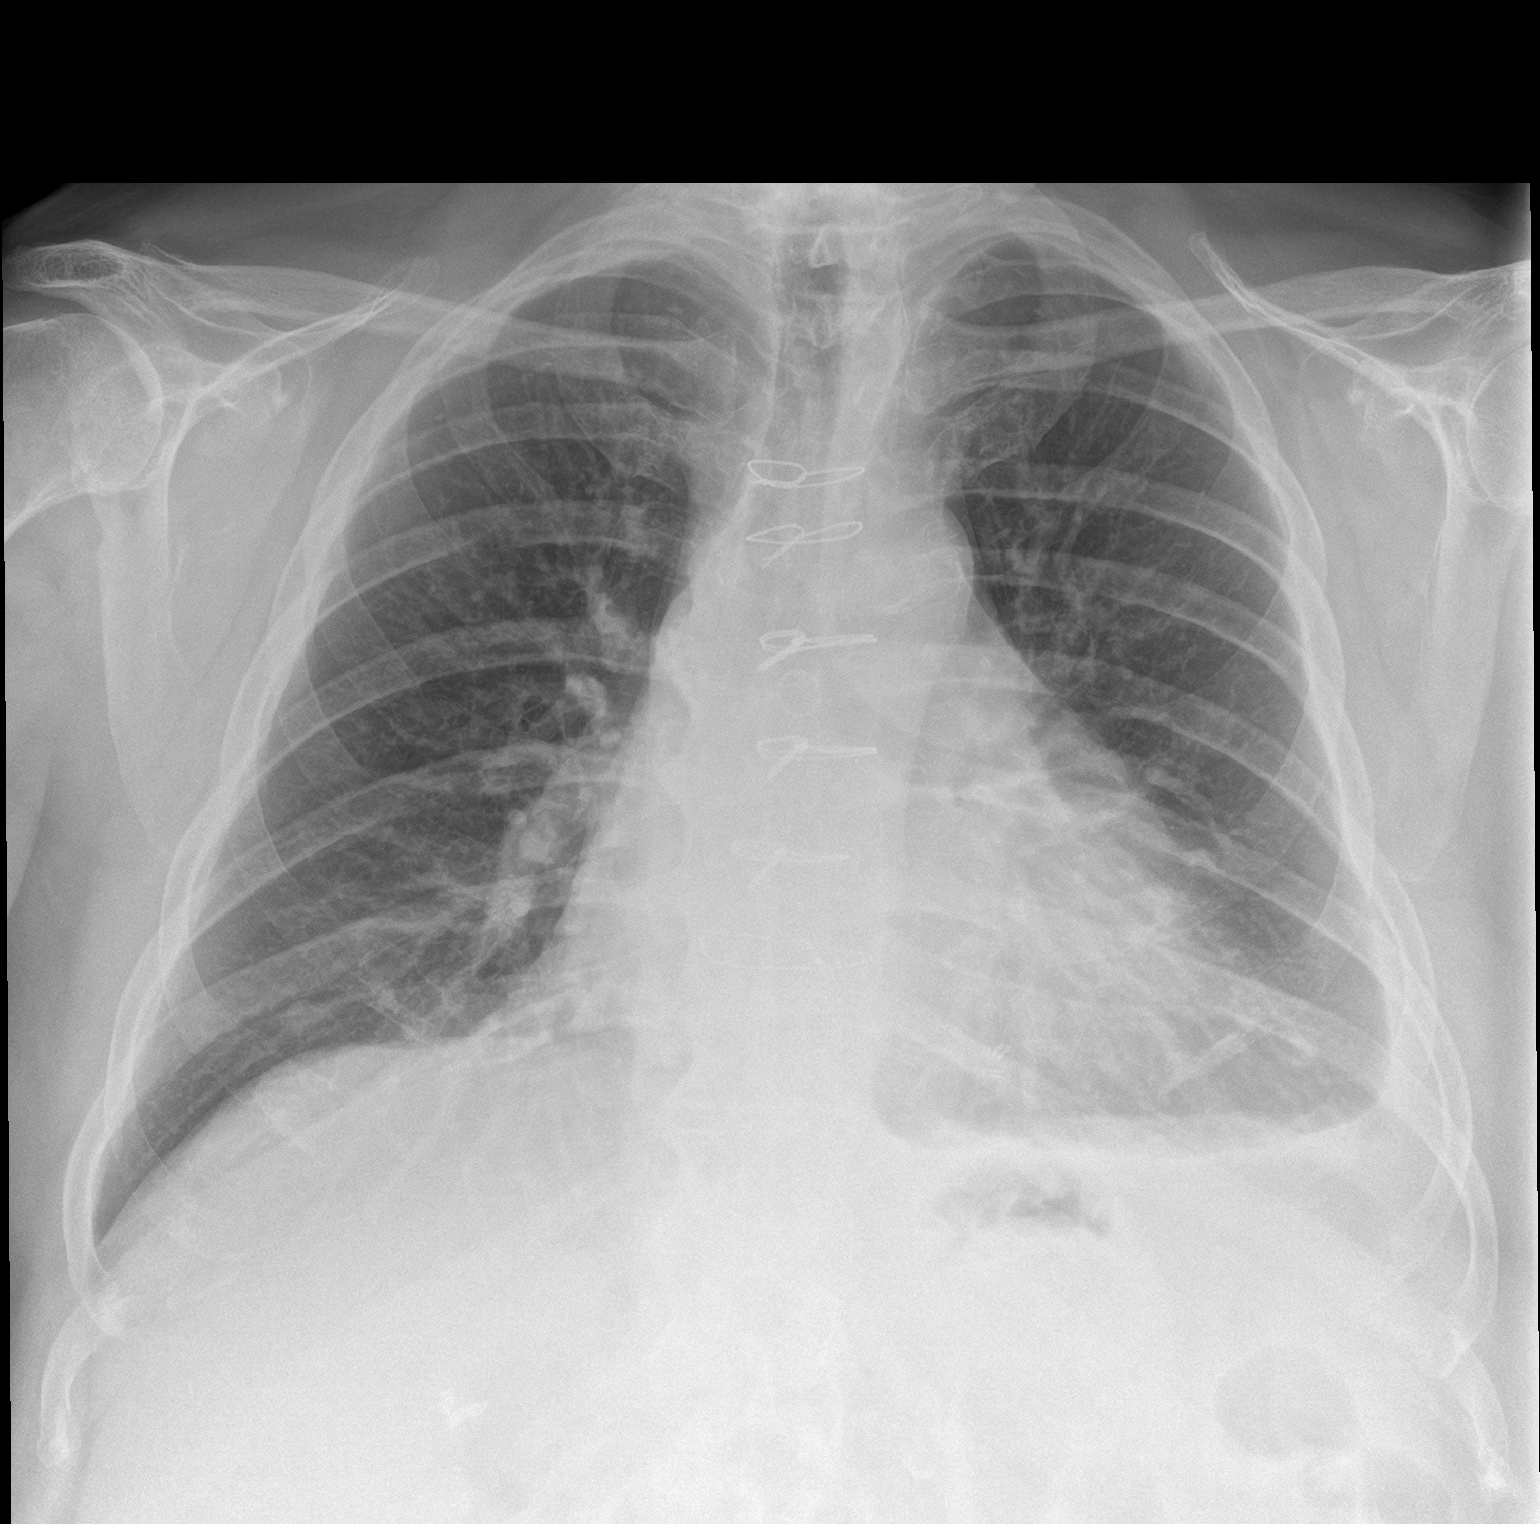

[chest lat]
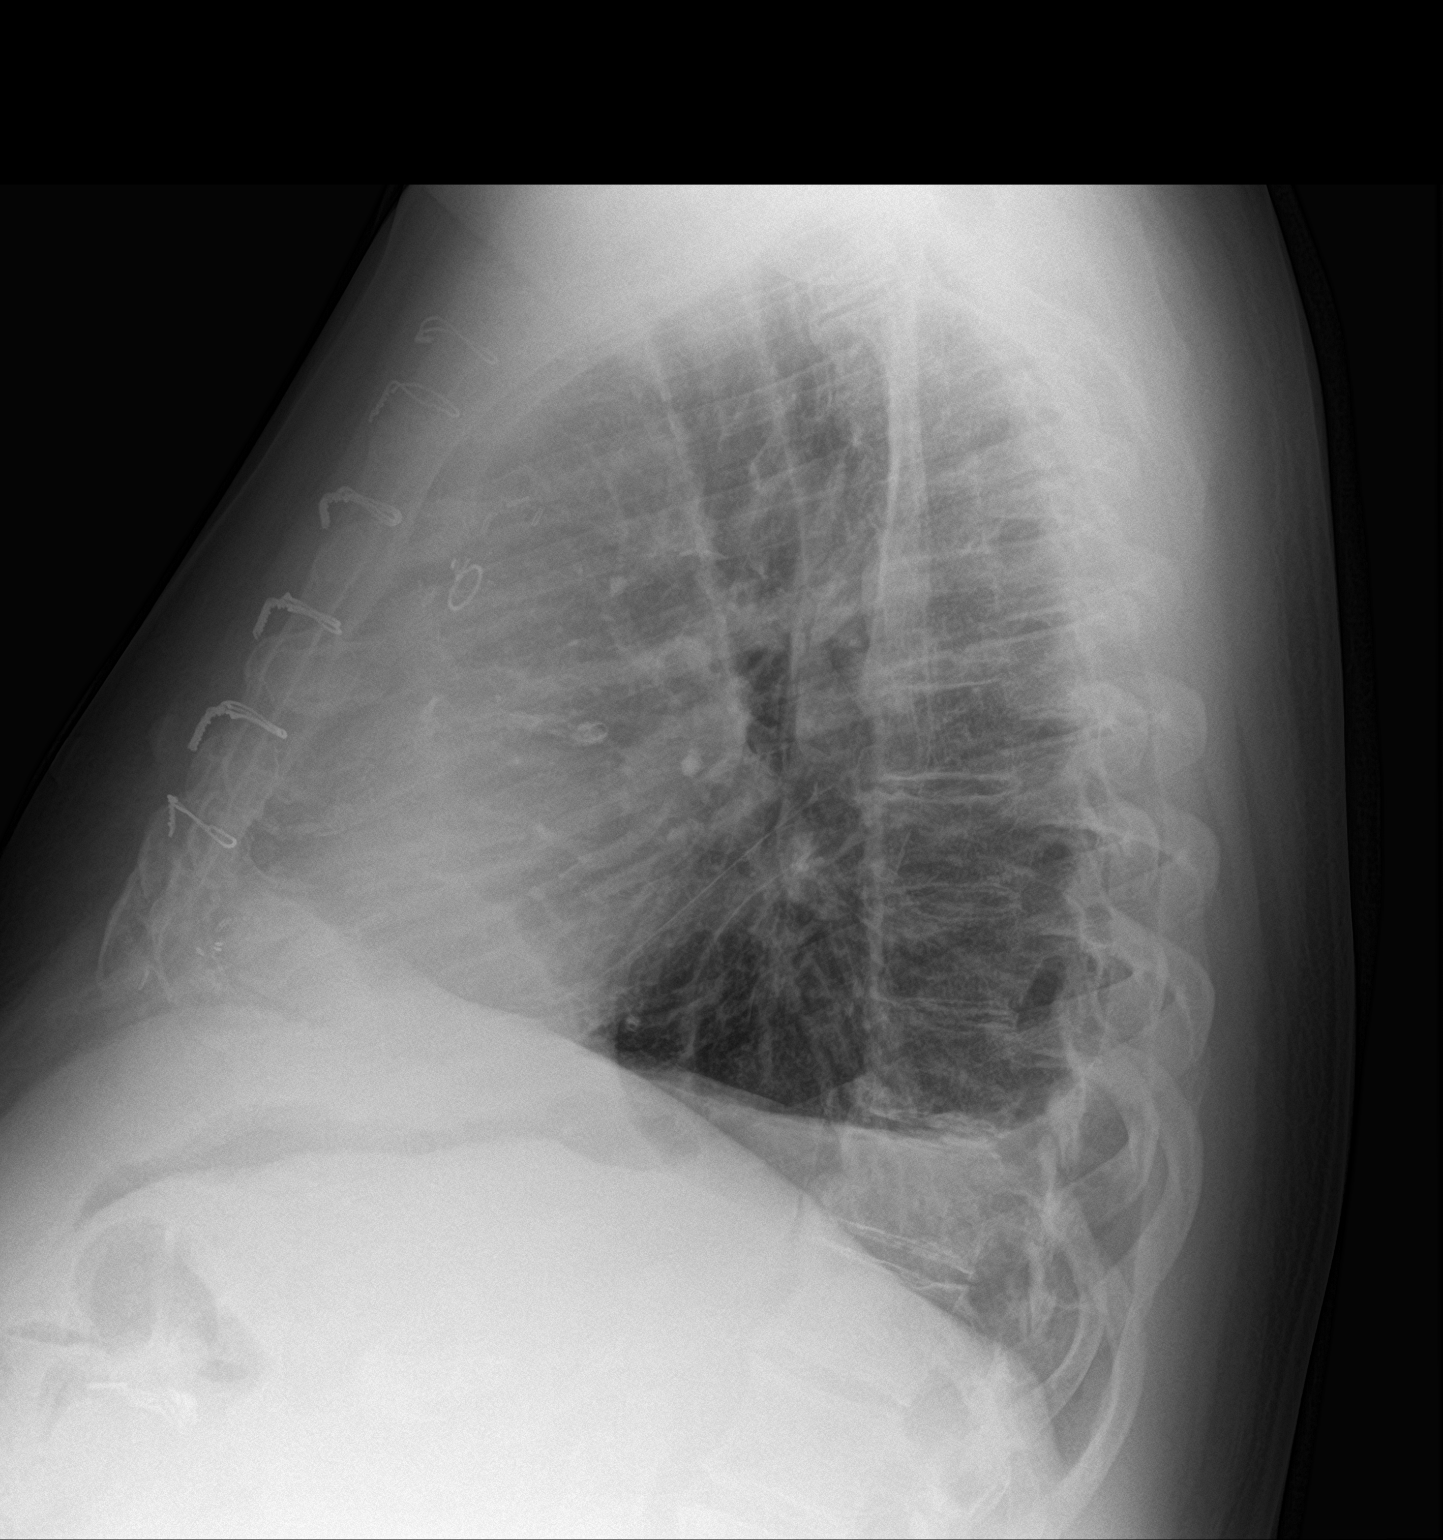

[2 of 2 positions shown; findings below may reference images not displayed]

FINDINGS: Stable cardiomediastinal silhouette. Status post coronary bypass
graft. No pneumothorax is noted. Right lung is clear. Small left
pleural effusion is noted. Minimal left basilar subsegmental
atelectasis is noted. Bony thorax is unremarkable.
IMPRESSION: Small left pleural effusion. Minimal left basilar subsegmental
atelectasis.

## 2021-11-18 ENCOUNTER — Other Ambulatory Visit: Payer: Self-pay | Admitting: Nurse Practitioner

## 2021-11-18 DIAGNOSIS — E118 Type 2 diabetes mellitus with unspecified complications: Secondary | ICD-10-CM

## 2021-11-25 ENCOUNTER — Other Ambulatory Visit: Payer: Self-pay | Admitting: Nurse Practitioner

## 2021-11-28 ENCOUNTER — Other Ambulatory Visit: Payer: Self-pay | Admitting: Nurse Practitioner

## 2021-11-28 DIAGNOSIS — Z794 Long term (current) use of insulin: Secondary | ICD-10-CM

## 2021-12-02 DIAGNOSIS — N410 Acute prostatitis: Secondary | ICD-10-CM | POA: Diagnosis not present

## 2021-12-08 ENCOUNTER — Other Ambulatory Visit: Payer: Self-pay | Admitting: Nurse Practitioner

## 2021-12-08 MED ORDER — AMOXICILLIN-POT CLAVULANATE 875-125 MG PO TABS: 1.0000 | ORAL_TABLET | Freq: Two times a day (BID) | ORAL | 0 refills | Status: DC

## 2021-12-16 ENCOUNTER — Other Ambulatory Visit: Payer: Self-pay | Admitting: Nurse Practitioner

## 2021-12-16 DIAGNOSIS — E118 Type 2 diabetes mellitus with unspecified complications: Secondary | ICD-10-CM

## 2021-12-26 ENCOUNTER — Other Ambulatory Visit: Payer: Self-pay | Admitting: Nurse Practitioner

## 2021-12-26 DIAGNOSIS — Z794 Long term (current) use of insulin: Secondary | ICD-10-CM

## 2021-12-26 DIAGNOSIS — I2510 Atherosclerotic heart disease of native coronary artery without angina pectoris: Secondary | ICD-10-CM

## 2021-12-30 ENCOUNTER — Other Ambulatory Visit: Payer: Self-pay | Admitting: Nurse Practitioner

## 2021-12-30 DIAGNOSIS — I2510 Atherosclerotic heart disease of native coronary artery without angina pectoris: Secondary | ICD-10-CM

## 2021-12-30 MED ORDER — CARVEDILOL 12.5 MG PO TABS: 12.5000 mg | ORAL_TABLET | Freq: Three times a day (TID) | ORAL | 1 refills | Status: DC

## 2022-01-06 ENCOUNTER — Other Ambulatory Visit: Payer: Self-pay | Admitting: Nurse Practitioner

## 2022-01-06 DIAGNOSIS — Z794 Long term (current) use of insulin: Secondary | ICD-10-CM

## 2022-01-17 ENCOUNTER — Other Ambulatory Visit: Payer: Self-pay | Admitting: Nurse Practitioner

## 2022-01-17 DIAGNOSIS — E118 Type 2 diabetes mellitus with unspecified complications: Secondary | ICD-10-CM

## 2022-01-20 ENCOUNTER — Other Ambulatory Visit: Payer: Self-pay | Admitting: Nurse Practitioner

## 2022-01-20 DIAGNOSIS — E118 Type 2 diabetes mellitus with unspecified complications: Secondary | ICD-10-CM

## 2022-01-20 DIAGNOSIS — Z794 Long term (current) use of insulin: Secondary | ICD-10-CM

## 2022-02-10 ENCOUNTER — Other Ambulatory Visit: Payer: Self-pay | Admitting: Nurse Practitioner

## 2022-02-10 DIAGNOSIS — Z794 Long term (current) use of insulin: Secondary | ICD-10-CM

## 2022-02-13 ENCOUNTER — Encounter: Payer: Self-pay | Admitting: Nurse Practitioner

## 2022-02-13 ENCOUNTER — Ambulatory Visit: Payer: Medicare PPO | Admitting: Nurse Practitioner

## 2022-02-13 VITALS — BP 129/79 | HR 87 | Temp 97.9°F | Resp 20 | Ht 68.0 in | Wt 251.0 lb

## 2022-02-13 DIAGNOSIS — I25110 Atherosclerotic heart disease of native coronary artery with unstable angina pectoris: Secondary | ICD-10-CM

## 2022-02-13 DIAGNOSIS — I10 Essential (primary) hypertension: Secondary | ICD-10-CM

## 2022-02-13 DIAGNOSIS — Z23 Encounter for immunization: Secondary | ICD-10-CM

## 2022-02-13 DIAGNOSIS — E1169 Type 2 diabetes mellitus with other specified complication: Secondary | ICD-10-CM

## 2022-02-13 DIAGNOSIS — Z6836 Body mass index (BMI) 36.0-36.9, adult: Secondary | ICD-10-CM | POA: Diagnosis not present

## 2022-02-13 DIAGNOSIS — K219 Gastro-esophageal reflux disease without esophagitis: Secondary | ICD-10-CM

## 2022-02-13 DIAGNOSIS — N401 Enlarged prostate with lower urinary tract symptoms: Secondary | ICD-10-CM

## 2022-02-13 DIAGNOSIS — E785 Hyperlipidemia, unspecified: Secondary | ICD-10-CM | POA: Diagnosis not present

## 2022-02-13 DIAGNOSIS — I2510 Atherosclerotic heart disease of native coronary artery without angina pectoris: Secondary | ICD-10-CM | POA: Diagnosis not present

## 2022-02-13 DIAGNOSIS — R3911 Hesitancy of micturition: Secondary | ICD-10-CM

## 2022-02-13 DIAGNOSIS — Z794 Long term (current) use of insulin: Secondary | ICD-10-CM | POA: Diagnosis not present

## 2022-02-13 DIAGNOSIS — E1159 Type 2 diabetes mellitus with other circulatory complications: Secondary | ICD-10-CM

## 2022-02-13 DIAGNOSIS — E118 Type 2 diabetes mellitus with unspecified complications: Secondary | ICD-10-CM | POA: Diagnosis not present

## 2022-02-13 LAB — BAYER DCA HB A1C WAIVED: HB A1C (BAYER DCA - WAIVED): 7.5 % — ABNORMAL HIGH (ref 4.8–5.6)

## 2022-02-13 MED ORDER — LISINOPRIL 2.5 MG PO TABS: 2.5000 mg | ORAL_TABLET | Freq: Every day | ORAL | 1 refills | Status: DC

## 2022-02-13 MED ORDER — FUROSEMIDE 20 MG PO TABS: ORAL_TABLET | ORAL | 1 refills | Status: DC

## 2022-02-13 MED ORDER — METFORMIN HCL 500 MG PO TABS: ORAL_TABLET | ORAL | 1 refills | Status: DC

## 2022-02-13 MED ORDER — PANTOPRAZOLE SODIUM 40 MG PO TBEC: 40.0000 mg | DELAYED_RELEASE_TABLET | Freq: Every day | ORAL | 1 refills | Status: DC | PRN

## 2022-02-13 MED ORDER — ATORVASTATIN CALCIUM 80 MG PO TABS: 80.0000 mg | ORAL_TABLET | Freq: Every day | ORAL | 1 refills | Status: DC

## 2022-02-13 MED ORDER — LANTUS SOLOSTAR 100 UNIT/ML ~~LOC~~ SOPN
44.0000 [IU] | PEN_INJECTOR | Freq: Every day | SUBCUTANEOUS | 5 refills | Status: DC
Start: 2022-02-13 — End: 2022-02-16

## 2022-02-13 MED ORDER — CARVEDILOL 12.5 MG PO TABS: 12.5000 mg | ORAL_TABLET | Freq: Three times a day (TID) | ORAL | 1 refills | Status: DC

## 2022-02-13 MED ORDER — INSULIN PEN NEEDLE 32G X 4 MM MISC: 1.0000 | Freq: Every day | 1 refills | Status: DC

## 2022-02-13 MED ORDER — SEMAGLUTIDE (2 MG/DOSE) 8 MG/3ML ~~LOC~~ SOPN: 2.0000 mg | PEN_INJECTOR | SUBCUTANEOUS | 5 refills | Status: DC

## 2022-02-13 MED ORDER — ISOSORBIDE MONONITRATE ER 30 MG PO TB24: 15.0000 mg | ORAL_TABLET | Freq: Every evening | ORAL | 3 refills | Status: DC

## 2022-02-13 NOTE — Addendum Note (Signed)
Addended by: Rolena Infante on: 02/13/2022 04:56 PM   Modules accepted: Orders

## 2022-02-13 NOTE — Progress Notes (Signed)
Subjective:    Patient ID: [name] [name], male    DOB: June 09, 1955, 66 y.o.   MRN: [id number]   Chief Complaint: medical management of chronic issues     HPI:  [name] [name] is a 66 y.o. who identifies as a male who was assigned male at birth.   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 Essential hypertension, benign No c/o chest pain, sob or headache. Does nt check his blood pressure at home. BP Readings from Last 3 Encounters:  10/12/21 134/70  09/25/21 122/78  08/12/21 128/68     2. Coronary artery disease involving native coronary artery of native heart with unstable angina pectoris [facility]) Last saw cardiology on 09/25/21. According to office note, no changes were made to plan of care.  3. Hyperlipidemia associated with type 2 diabetes mellitus ([place]) Does try to watch diet but doe snot do much dedicated exercise. Lab Results  Component Value Date   CHOL 99 (L) 08/12/2021   HDL 22 (L) 08/12/2021   LDLCALC 44 08/12/2021   TRIG 204 (H) 08/12/2021   CHOLHDL 4.5 08/12/2021     4. Type 2 diabetes mellitus with complication, with long-term current use of insulin (HCC) Fasting blood sugars are running around 90-150. No low blood sugars  5. Gastroesophageal reflux disease without esophagitis Is on protonix daily and is doing well  6. Benign prostatic hyperplasia with urinary hesitancy He has frequent UTI and has had to go to urgent care for treatment. He denies any hesitancy.  7. BMI 36.0-36.9,adult Weight is up 11 lbs Wt Readings from Last 3 Encounters:  02/13/22 251 lb (113.9 kg)  10/12/21 240 lb (108.9 kg)  09/25/21 243 lb 3.2 oz (110.3 kg)   BMI Readings from Last 3 Encounters:  02/13/22 38.16 kg/m  10/12/21 36.49 kg/m  09/25/21 36.98 kg/m      New complaints: None today  No Known Allergies Outpatient  Encounter Medications as of 02/13/2022  Medication Sig   Accu-Chek Softclix Lancets lancets CHECK BLOOD SUGAR UP TO 4 TIMES A DAY.Dx E11.8   amoxicillin-clavulanate (AUGMENTIN) 875-125 MG tablet Take 1 tablet by mouth 2 (two) times daily.   aspirin EC 81 MG tablet Take 1 tablet (81 mg total) by mouth daily. Swallow whole.   atorvastatin (LIPITOR) 80 MG tablet Take 1 tablet (80 mg total) by mouth daily.   blood glucose meter kit and supplies Dispense based on patient and insurance preference. Use up to four times daily as directed. (FOR ICD-10 E10.9, E11.9).   carvedilol (COREG) 12.5 MG tablet Take 1 tablet (12.5 mg total) by mouth 3 (three) times daily.   cephALEXin (KEFLEX) 500 MG capsule Take 1 capsule (500 mg total) by mouth 4 (four) times daily.   cetirizine (ZYRTEC) 10 MG tablet Take 10 mg by mouth daily.   CINNAMON PO Take 1 tablet by mouth daily.   CRANBERRY PO Take 1 tablet by mouth 2 (two) times daily.   fosfomycin (MONUROL) 3 g PACK Take 1 packet (3 gram) by mouth daily   furosemide (LASIX) 20 MG tablet Take 20 mg daily as needed for leg swelling   glucose blood (ACCU-CHEK AVIVA PLUS) test strip CHECK BLOOD SUGAR UP TO 4 TIMES A DAY.Dx E11.8   Insulin Aspart FlexPen (NOVOLOG) 100 UNIT/ML INJECT PER SLIDING SCALE 40-75 UNITS TWICE DAILY   Insulin Pen Needle (  GLOBAL EASE INJECT PEN NEEDLES) 31G X 8 MM MISC USE UP TO 8 TIMES A DAY. Dx E11.8   isosorbide mononitrate (IMDUR) 30 MG 24 hr tablet Take 0.5 tablets (15 mg total) by mouth every evening.   LANTUS SOLOSTAR 100 UNIT/ML Solostar Pen INJECT 44 UNITS IN MORNING AND 75 UNITS AT NIGHT.   lisinopril (ZESTRIL) 2.5 MG tablet Take 1 tablet (2.5 mg total) by mouth daily.   metFORMIN (GLUCOPHAGE) 500 MG tablet TAKE 2 TABLETS BY MOUTHTWICE DAILY.   nitroGLYCERIN (NITROSTAT) 0.4 MG SL tablet Place 1 tablet (0.4 mg total) under the tongue every 5 (five) minutes x 3 doses as needed for chest pain.   pantoprazole (PROTONIX) 40 MG tablet Take 1  tablet (40 mg total) by mouth daily as needed (for acid reflux).   Semaglutide, 1 MG/DOSE, (OZEMPIC, 1 MG/DOSE,) 4 MG/3ML SOPN Inject 1 mg into the skin once a week. (NEEDS TO BE SEEN BEFORE NEXT REFILL)   sulfamethoxazole-trimethoprim (BACTRIM DS) 800-160 MG tablet Take 1 tablet by mouth 2 (two) times daily.   tamsulosin (FLOMAX) 0.4 MG CAPS capsule TAKE 1 CAPSULE BY MOUTH AT BEDTIME.   No facility-administered encounter medications on file as of 02/13/2022.    Past Surgical History:  Procedure Laterality Date   CARDIAC CATHETERIZATION  2012   CAROTID STENT     stents x 2    CHOLECYSTECTOMY N/A 12/04/2015   Procedure: LAPAROSCOPIC CHOLECYSTECTOMY;  Surgeon: Aviva Signs, MD;  Location: [facility];  Service: General;  Laterality: N/A;   CHOLECYSTECTOMY, LAPAROSCOPIC  12/05/2015   CORONARY ANGIOPLASTY  2012   STENT X 1 2012, STENT X 1 YRS BEFORE   CORONARY ARTERY BYPASS GRAFT N/A 08/21/2020   Procedure: CORONARY ARTERY BYPASS GRAFTING (CABG) X 4, USING LEFT INTERNAL MAMMARY ARTERY AND RIGHT GREATER SAPHENOUS VEIN HARVESTED ENDOSCOPICALLY. SVG TO OM1, OM3 SEQUENTIALLY, SVG TO DIAG., LIMA TO LAD;  Surgeon: Melrose Nakayama, MD;  Location: [facility];  Service: Open Heart Surgery;  Laterality: N/A;   CORONARY ATHERECTOMY N/A 02/09/2020   Procedure: CORONARY ATHERECTOMY;  Surgeon: Wellington Hampshire, MD;  Location: [place] [facility];  Service: Cardiovascular;  Laterality: N/A;   CORONARY STENT INTERVENTION N/A 02/09/2020   Procedure: CORONARY STENT INTERVENTION;  Surgeon: Wellington Hampshire, MD;  Location: [place] [facility];  Service: Cardiovascular;  Laterality: N/A;   CORONARY STENT INTERVENTION N/A 05/22/2020   Procedure: CORONARY STENT INTERVENTION;  Surgeon: Jettie Booze, MD;  Location: [place] [facility];  Service: Cardiovascular;  Laterality: N/A;   ENDOVEIN HARVEST OF GREATER SAPHENOUS VEIN Right 08/21/2020   Procedure: ENDOVEIN HARVEST OF GREATER SAPHENOUS VEIN;  Surgeon: Melrose Nakayama, MD;  Location: [place];  Service: Open Heart Surgery;  Laterality: Right;   HYDROCELE EXCISION Bilateral 06/02/2017   Procedure: HYDROCELECTOMY ADULT;  Surgeon: Ceasar Mons, MD;  Location: [facility];  Service: Urology;  Laterality: Bilateral;  ONLY NEEDS 45 MIN   INGUINAL HERNIA REPAIR     RIGHT GROIN   INTRAVASCULAR IMAGING/OCT N/A 05/22/2020   Procedure: INTRAVASCULAR IMAGING/OCT;  Surgeon: Jettie Booze, MD;  Location: [place] [facility];  Service: Cardiovascular;  Laterality: N/A;   INTRAVASCULAR ULTRASOUND/IVUS N/A 02/09/2020   Procedure: Intravascular Ultrasound/IVUS;  Surgeon: Wellington Hampshire, MD;  Location: [place] [facility];  Service: Cardiovascular;  Laterality: N/A;   KNEE ARTHROSCOPY Left 05/23/2019   Procedure: LEFT KNEE ARTHROSCOPY AND DEBRIDEMENT PARTIAL MEDIAL MENISECTOMY;  Surgeon: Newt Minion, MD;  Location: [facility];  Service: Orthopedics;  Laterality: Left;   LEFT HEART CATH AND CORONARY ANGIOGRAPHY N/A 02/07/2020   Procedure: LEFT HEART CATH AND CORONARY ANGIOGRAPHY;  Surgeon: Leonie Man, MD;  Location: [place] [facility];  Service: Cardiovascular;  Laterality: N/A;   LEFT HEART CATH AND CORONARY ANGIOGRAPHY N/A 05/22/2020   Procedure: LEFT HEART CATH AND CORONARY ANGIOGRAPHY;  Surgeon: Jettie Booze, MD;  Location: [place] [facility];  Service: Cardiovascular;  Laterality: N/A;   LEFT HEART CATH AND CORONARY ANGIOGRAPHY N/A 08/15/2020   Procedure: LEFT HEART CATH AND CORONARY ANGIOGRAPHY;  Surgeon: Lorretta Harp, MD;  Location: [place] [facility];  Service: Cardiovascular;  Laterality: N/A;   TEE WITHOUT CARDIOVERSION N/A 08/21/2020   Procedure: TRANSESOPHAGEAL ECHOCARDIOGRAM (TEE);  Surgeon: Melrose Nakayama, MD;  Location: [place];  Service: Open Heart Surgery;  Laterality: N/A;   TRIGGER FINGER RELEASE Right 01/08/2015   Procedure: RELEASE TRIGGER FINGER/A-1 PULLEY RIGHT MIDDLE FINGER;  Surgeon: Daryll Brod, MD;   Location: [place];  Service: Orthopedics;  Laterality: Right;    Family History  Problem Relation Age of Onset   Hyperlipidemia Mother    Hypertension Mother    Hyperlipidemia Father    Hypertension Father    Diabetes Father    Dementia Father    Diabetes Maternal Grandfather       Controlled substance contract: n/a     Review of Systems  Constitutional:  Negative for diaphoresis.  Eyes:  Negative for pain.  Respiratory:  Negative for shortness of breath.   Cardiovascular:  Negative for chest pain, palpitations and leg swelling.  Gastrointestinal:  Negative for abdominal pain.  Endocrine: Negative for polydipsia.  Skin:  Negative for rash.  Neurological:  Negative for dizziness, weakness and headaches.  Hematological:  Does not bruise/bleed easily.  All other systems reviewed and are negative.      Objective:   Physical Exam Vitals and nursing note reviewed.  Constitutional:      Appearance: Normal appearance. He is well-developed.  HENT:     Head: Normocephalic.     Nose: Nose normal.     Mouth/Throat:     Mouth: Mucous membranes are moist.     Pharynx: Oropharynx is clear.  Eyes:     Pupils: Pupils are equal, round, and reactive to light.  Neck:     Thyroid: No thyroid mass or thyromegaly.     Vascular: No carotid bruit or JVD.     Trachea: Phonation normal.  Cardiovascular:     Rate and Rhythm: Normal rate and regular rhythm.  Pulmonary:     Effort: Pulmonary effort is normal. No respiratory distress.     Breath sounds: Normal breath sounds.  Abdominal:     General: Bowel sounds are normal.     Palpations: Abdomen is soft.     Tenderness: There is no abdominal tenderness.  Musculoskeletal:        General: Normal range of motion.     Cervical back: Normal range of motion and neck supple.  Lymphadenopathy:     Cervical: No cervical adenopathy.  Skin:    General: Skin is warm and dry.  Neurological:     Mental Status: He is alert  and oriented to person, place, and time.  Psychiatric:        Behavior: Behavior normal.        Thought Content: Thought content normal.        Judgment: Judgment normal.  BP 129/79   Pulse 87   Temp 97.9 F (36.6 C) (Temporal)   Resp 20   Ht _0  (1.727 m)   Wt 251 lb (113.9 kg)   SpO2 92%   BMI 38.16 kg/m    Hgba1c 7.5%     Assessment & Plan:   [name] [name] comes in today with chief complaint of Medical Management of Chronic Issues   Diagnosis and orders addressed:  1. Essential hypertension, benign Low sodium diet - CMP14+EGFR - CBC with Differential/Platelet - lisinopril (ZESTRIL) 2.5 MG tablet; Take 1 tablet (2.5 mg total) by mouth daily.  Dispense: 90 tablet; Refill: 1  2. Coronary artery disease involving native coronary artery of native heart with unstable angina pectoris [facility]) Keep yearly follow up with cardiology  3. Hyperlipidemia associated with type 2 diabetes mellitus (HCC) Low fat diet - Lipid panel - atorvastatin (LIPITOR) 80 MG tablet; Take 1 tablet (80 mg total) by mouth daily.  Dispense: 90 tablet; Refill: 1  4. Type 2 diabetes mellitus with other circulatory complication, with long-term current use of insulin (HCC) - insulin glargine (LANTUS SOLOSTAR) 100 UNIT/ML Solostar Pen; Inject 44 Units into the skin daily.  Dispense: 30 mL; Refill: 5 Stricter carb counting Increase ozempic to 54m weekly - Bayer DCA Hb A1c Waived - Microalbumin / creatinine urine ratio - metFORMIN (GLUCOPHAGE) 500 MG tablet; TAKE 2 TABLETS BY MOUTHTWICE DAILY.  Dispense: 120 tablet; Refill: 1 - Semaglutide, 2 MG/DOSE, 8 MG/3ML SOPN; Inject 2 mg as directed once a week.  Dispense: 3 mL; Refill: 5 - Insulin Pen Needle 32G X 4 MM MISC; 1 each by Does not apply route daily.  Dispense: 100 each; Refill: 1  5. Gastroesophageal reflux disease without esophagitis Avoid spicy foods Do not eat 2 hours prior to bedtime - pantoprazole (PROTONIX) 40 MG tablet; Take 1 tablet  (40 mg total) by mouth daily as needed (for acid reflux).  Dispense: 90 tablet; Refill: 1  6. Benign prostatic hyperplasia with urinary hesitancy Report any voiding issues  7. BMI 36.0-36.9,adult Discussed diet and exercise for person with BMI >25 Will recheck weight in 3-6 months   8. Coronary artery disease involving native coronary artery of native heart without angina pectoris - carvedilol (COREG) 12.5 MG tablet; Take 1 tablet (12.5 mg total) by mouth 3 (three) times daily.  Dispense: 270 tablet; Refill: 1 - furosemide (LASIX) 20 MG tablet; Take 20 mg daily as needed for leg swelling  Dispense: 90 tablet; Refill: 1 - isosorbide mononitrate (IMDUR) 30 MG 24 hr tablet; Take 0.5 tablets (15 mg total) by mouth every evening.  Dispense: 45 tablet; Refill: 3   Labs pending Health Maintenance reviewed Diet and exercise encouraged  Follow up plan: 3 months   Mary-Margaret MHassell Done FNP

## 2022-02-14 LAB — CBC WITH DIFFERENTIAL/PLATELET
Basophils Absolute: 0 10*3/uL (ref 0.0–0.2)
Basos: 1 %
EOS (ABSOLUTE): 0.2 10*3/uL (ref 0.0–0.4)
Eos: 3 %
Hematocrit: 39.8 % (ref 37.5–51.0)
Hemoglobin: 13.1 g/dL (ref 13.0–17.7)
Immature Grans (Abs): 0 10*3/uL (ref 0.0–0.1)
Immature Granulocytes: 0 %
Lymphocytes Absolute: 1.7 10*3/uL (ref 0.7–3.1)
Lymphs: 30 %
MCH: 27.6 pg (ref 26.6–33.0)
MCHC: 32.9 g/dL (ref 31.5–35.7)
MCV: 84 fL (ref 79–97)
Monocytes Absolute: 0.5 10*3/uL (ref 0.1–0.9)
Monocytes: 10 %
Neutrophils Absolute: 3.1 10*3/uL (ref 1.4–7.0)
Neutrophils: 56 %
Platelets: 260 10*3/uL (ref 150–450)
RBC: 4.75 x10E6/uL (ref 4.14–5.80)
RDW: 14.3 % (ref 11.6–15.4)
WBC: 5.5 10*3/uL (ref 3.4–10.8)

## 2022-02-14 LAB — CMP14+EGFR
ALT: 33 IU/L (ref 0–44)
AST: 26 IU/L (ref 0–40)
Albumin/Globulin Ratio: 1.7 (ref 1.2–2.2)
Albumin: 4.4 g/dL (ref 3.9–4.9)
Alkaline Phosphatase: 59 IU/L (ref 44–121)
BUN/Creatinine Ratio: 19 (ref 10–24)
BUN: 18 mg/dL (ref 8–27)
Bilirubin Total: 0.4 mg/dL (ref 0.0–1.2)
CO2: 24 mmol/L (ref 20–29)
Calcium: 9.3 mg/dL (ref 8.6–10.2)
Chloride: 96 mmol/L (ref 96–106)
Creatinine, Ser: 0.97 mg/dL (ref 0.76–1.27)
Globulin, Total: 2.6 g/dL (ref 1.5–4.5)
Glucose: 233 mg/dL — ABNORMAL HIGH (ref 70–99)
Potassium: 4.4 mmol/L (ref 3.5–5.2)
Sodium: 139 mmol/L (ref 134–144)
Total Protein: 7 g/dL (ref 6.0–8.5)
eGFR: 85 mL/min/{1.73_m2} (ref >59)

## 2022-02-14 LAB — LIPID PANEL
Chol/HDL Ratio: 4 ratio (ref 0.0–5.0)
Cholesterol, Total: 85 mg/dL — ABNORMAL LOW (ref 100–199)
HDL: 21 mg/dL — ABNORMAL LOW (ref >39)
LDL Chol Calc (NIH): 36 mg/dL (ref 0–99)
Triglycerides: 164 mg/dL — ABNORMAL HIGH (ref 0–149)
VLDL Cholesterol Cal: 28 mg/dL (ref 5–40)

## 2022-02-14 LAB — MICROALBUMIN / CREATININE URINE RATIO
Creatinine, Urine: 59.7 mg/dL
Microalb/Creat Ratio: 27 mg/g creat (ref 0–29)
Microalbumin, Urine: 16 ug/mL

## 2022-02-16 ENCOUNTER — Other Ambulatory Visit: Payer: Self-pay | Admitting: Nurse Practitioner

## 2022-02-16 DIAGNOSIS — Z794 Long term (current) use of insulin: Secondary | ICD-10-CM

## 2022-02-16 MED ORDER — LANTUS SOLOSTAR 100 UNIT/ML ~~LOC~~ SOPN: PEN_INJECTOR | SUBCUTANEOUS | 5 refills | Status: DC

## 2022-03-04 ENCOUNTER — Other Ambulatory Visit: Payer: Self-pay | Admitting: Nurse Practitioner

## 2022-03-04 DIAGNOSIS — E118 Type 2 diabetes mellitus with unspecified complications: Secondary | ICD-10-CM

## 2022-03-04 DIAGNOSIS — N401 Enlarged prostate with lower urinary tract symptoms: Secondary | ICD-10-CM

## 2022-03-09 DIAGNOSIS — Z794 Long term (current) use of insulin: Secondary | ICD-10-CM | POA: Diagnosis not present

## 2022-03-09 DIAGNOSIS — H25813 Combined forms of age-related cataract, bilateral: Secondary | ICD-10-CM | POA: Diagnosis not present

## 2022-03-09 DIAGNOSIS — E119 Type 2 diabetes mellitus without complications: Secondary | ICD-10-CM | POA: Diagnosis not present

## 2022-03-09 LAB — HM DIABETES EYE EXAM

## 2022-03-18 ENCOUNTER — Other Ambulatory Visit: Payer: Self-pay | Admitting: Nurse Practitioner

## 2022-03-18 DIAGNOSIS — E118 Type 2 diabetes mellitus with unspecified complications: Secondary | ICD-10-CM

## 2022-03-18 DIAGNOSIS — Z794 Long term (current) use of insulin: Secondary | ICD-10-CM

## 2022-04-12 NOTE — Progress Notes (Unsigned)
Cardiology Office Note  Date: 04/13/2022   ID: [name], [name] [birth year], MRN [id number]  PCP:  Chevis Pretty, FNP  Cardiologist:  Rozann Lesches, MD Electrophysiologist:  None   Chief Complaint  Patient presents with   Cardiac follow-up    History of Present Illness: [name] [name] is a 67 y.o. male last seen in June 2023.  He is here for a routine visit.  He does not report any angina or nitroglycerin use in the interim.  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  We went over his medications, he reports compliance with cardiac regimen and no obvious intolerances.  His last LDL was 36 on Lipitor.  I personally reviewed his ECG today which shows sinus rhythm with nonspecific ST-T changes.  No palpitations or syncope.  No orthopnea or PND.  Did have some wheezing with recent cold wet weather, but resolved and has not been of major recurrence.  Past Medical History:  Diagnosis Date   Anxiety    Coronary atherosclerosis of native coronary artery    DES distal circumflex 2005; DES LAD/diagonal bifurcation 09/2010; DES ostial LAD and DES left PL 01/2020; CABG May 2022 (LIMA to LAD, SVG to diagonal, SVG to OM1 and OM 3)   Essential hypertension    GERD (gastroesophageal reflux disease)    History of kidney stones    Mixed hyperlipidemia    Myocardial infarction ([place])    Anterolateral with VF arrest 7/12   Type 2 diabetes mellitus (HCC)     Current Outpatient Medications  Medication Sig Dispense Refill   Accu-Chek Softclix Lancets lancets CHECK BLOOD SUGAR UP TO 4 TIMES A DAY.Dx E11.8 100 each 12   aspirin EC 81 MG tablet Take 1 tablet (81 mg total) by mouth daily. Swallow whole. 90 tablet 3   atorvastatin (LIPITOR) 80 MG tablet Take 1 tablet (80 mg total) by mouth daily. 90 tablet 1   blood glucose meter kit and supplies Dispense based on patient and insurance preference. Use up to four times daily as directed. (FOR ICD-10 E10.9, E11.9).  1 each 0   carvedilol (COREG) 12.5 MG tablet Take 1 tablet (12.5 mg total) by mouth 3 (three) times daily. 270 tablet 1   cetirizine (ZYRTEC) 10 MG tablet Take 10 mg by mouth daily.     CINNAMON PO Take 1 tablet by mouth daily.     CRANBERRY PO Take 1 tablet by mouth 2 (two) times daily.     furosemide (LASIX) 20 MG tablet Take 20 mg daily as needed for leg swelling 90 tablet 1   glucose blood (ACCU-CHEK AVIVA PLUS) test strip CHECK BLOOD SUGAR UP TO 4 TIMES A DAY.Dx E11.8 400 strip 3   Insulin Aspart FlexPen (NOVOLOG) 100 UNIT/ML INJECT PER SLIDING SCALE 40-75 UNITS TWICE DAILY 15 mL 1   insulin glargine (LANTUS SOLOSTAR) 100 UNIT/ML Solostar Pen 48 u in AM and 78u in PM 30 mL 5   Insulin Pen Needle 32G X 4 MM MISC 1 each by Does not apply route daily. 100 each 1   isosorbide mononitrate (IMDUR) 30 MG 24 hr tablet Take 0.5 tablets (15 mg total) by mouth every evening. 45 tablet 3   lisinopril (ZESTRIL) 2.5 MG tablet Take 1 tablet (2.5 mg total) by mouth daily. 90 tablet 1   metFORMIN (GLUCOPHAGE) 500 MG tablet TAKE 2 TABLETS BY MOUTH TWICE DAILY. 120 tablet 1   pantoprazole (PROTONIX) 40 MG tablet Take 1 tablet (40  mg total) by mouth daily as needed (for acid reflux). 90 tablet 1   Semaglutide, 2 MG/DOSE, 8 MG/3ML SOPN Inject 2 mg as directed once a week. 3 mL 5   tamsulosin (FLOMAX) 0.4 MG CAPS capsule TAKE 1 CAPSULE BY MOUTH AT BEDTIME. 90 capsule 0   nitroGLYCERIN (NITROSTAT) 0.4 MG SL tablet Place 1 tablet (0.4 mg total) under the tongue every 5 (five) minutes x 3 doses as needed for chest pain. 25 tablet 3   No current facility-administered medications for this visit.   Allergies:  Patient has no known allergies.   ROS: No leg swelling.  Physical Exam: VS:  BP 126/72   Pulse 85   Ht '5\' 8"'$  (1.727 m)   Wt 246 lb 12.8 oz (111.9 kg)   SpO2 96%   BMI 37.53 kg/m , BMI Body mass index is 37.53 kg/m.  Wt Readings from Last 3 Encounters:  04/13/22 246 lb 12.8 oz (111.9 kg)  02/13/22  251 lb (113.9 kg)  10/12/21 240 lb (108.9 kg)    General: Patient appears comfortable at rest. HEENT: Conjunctiva and lids normal. Neck: Supple, no elevated JVP or carotid bruits. Lungs: Clear to auscultation, nonlabored breathing at rest. Cardiac: Regular rate and rhythm, no S3 or significant systolic murmur, no pericardial rub.  ECG:  An ECG dated 11/27/2020 was personally reviewed today and demonstrated:  Sinus rhythm with prolonged PR interval, nonspecific ST-T changes.  Recent Labwork: 02/13/2022: ALT 33; AST 26; BUN 18; Creatinine, Ser 0.97; Hemoglobin 13.1; Platelets 260; Potassium 4.4; Sodium 139     Component Value Date/Time   CHOL 85 (L) 02/13/2022 1451   TRIG 164 (H) 02/13/2022 1451   HDL 21 (L) 02/13/2022 1451   CHOLHDL 4.0 02/13/2022 1451   CHOLHDL 3.7 08/16/2020 0319   VLDL 26 08/16/2020 0319   LDLCALC 36 02/13/2022 1451    Other Studies Reviewed Today:  Echocardiogram 08/16/2020:  1. Left ventricular ejection fraction, by estimation, is 60 to 65%. The  left ventricle has normal function. Left ventricular endocardial border  not optimally defined to evaluate regional wall motion. Grossly, no wall  motion abnormalities seen. There is  mild concentric left ventricular hypertrophy. Left ventricular diastolic  parameters are consistent with Grade I diastolic dysfunction (impaired  relaxation). Elevated left atrial pressure.   2. Right ventricular systolic function is normal. The right ventricular  size is normal. Tricuspid regurgitation signal is inadequate for assessing  PA pressure.   3. The mitral valve is normal in structure. No evidence of mitral valve  regurgitation.   4. The aortic valve is tricuspid. There is mild calcification of the  aortic valve. Aortic valve regurgitation is not visualized. Mild aortic  valve sclerosis is present, with no evidence of aortic valve stenosis.   Assessment and Plan:  1.  Multivessel CAD status post CABG in May 2022.  ECG  reviewed and stable.  He does not describe any angina or nitroglycerin use and we will continue with medical therapy and observation for now.  Current regimen includes aspirin, Lipitor, Coreg, lisinopril, Imdur, Ozempic, and as needed nitroglycerin.  2.  Essential hypertension, blood pressure is adequately controlled today, no changes made to current regimen.  3.  Mixed hyperlipidemia with aggressive LDL control, most recently 36 on Lipitor.  Medication Adjustments/Labs and Tests Ordered: Current medicines are reviewed at length with the patient today.  Concerns regarding medicines are outlined above.   Tests Ordered: Orders Placed This Encounter  Procedures   EKG 12-Lead  Medication Changes: Meds ordered this encounter  Medications   nitroGLYCERIN (NITROSTAT) 0.4 MG SL tablet    Sig: Place 1 tablet (0.4 mg total) under the tongue every 5 (five) minutes x 3 doses as needed for chest pain.    Dispense:  25 tablet    Refill:  3    Disposition:  Follow up  6 months.  Signed, Satira Sark, MD, [facility] 04/13/2022 10:46 AM    [place] [facility] [place], [place], [place] [address] Phone: [phone]; Fax: [phone]

## 2022-04-13 ENCOUNTER — Ambulatory Visit: Payer: Medicare PPO | Attending: Cardiology | Admitting: Cardiology

## 2022-04-13 ENCOUNTER — Encounter: Payer: Self-pay | Admitting: Cardiology

## 2022-04-13 VITALS — BP 126/72 | HR 85 | Ht 68.0 in | Wt 246.8 lb

## 2022-04-13 DIAGNOSIS — I10 Essential (primary) hypertension: Secondary | ICD-10-CM

## 2022-04-13 DIAGNOSIS — I25119 Atherosclerotic heart disease of native coronary artery with unspecified angina pectoris: Secondary | ICD-10-CM

## 2022-04-13 DIAGNOSIS — E782 Mixed hyperlipidemia: Secondary | ICD-10-CM

## 2022-04-13 MED ORDER — NITROGLYCERIN 0.4 MG SL SUBL: 0.4000 mg | SUBLINGUAL_TABLET | SUBLINGUAL | 3 refills | Status: DC | PRN

## 2022-04-13 NOTE — Patient Instructions (Signed)
Medication Instructions:  Your physician recommends that you continue on your current medications as directed. Please refer to the Current Medication list given to you today.   Labwork: None today  Testing/Procedures: None today  Follow-Up: 6 months  Any Other Special Instructions Will Be Listed Below (If Applicable).  If you need a refill on your cardiac medications before your next appointment, please call your pharmacy.  

## 2022-04-16 DIAGNOSIS — N400 Enlarged prostate without lower urinary tract symptoms: Secondary | ICD-10-CM | POA: Diagnosis not present

## 2022-04-16 DIAGNOSIS — N528 Other male erectile dysfunction: Secondary | ICD-10-CM | POA: Diagnosis not present

## 2022-04-16 DIAGNOSIS — N410 Acute prostatitis: Secondary | ICD-10-CM | POA: Diagnosis not present

## 2022-05-07 ENCOUNTER — Other Ambulatory Visit: Payer: Self-pay | Admitting: Nurse Practitioner

## 2022-05-07 DIAGNOSIS — E118 Type 2 diabetes mellitus with unspecified complications: Secondary | ICD-10-CM

## 2022-05-15 ENCOUNTER — Other Ambulatory Visit: Payer: Self-pay | Admitting: Nurse Practitioner

## 2022-05-15 DIAGNOSIS — I10 Essential (primary) hypertension: Secondary | ICD-10-CM

## 2022-05-18 ENCOUNTER — Ambulatory Visit: Payer: Medicare PPO | Admitting: Nurse Practitioner

## 2022-05-18 ENCOUNTER — Encounter: Payer: Self-pay | Admitting: Nurse Practitioner

## 2022-05-18 ENCOUNTER — Other Ambulatory Visit: Payer: Self-pay | Admitting: Nurse Practitioner

## 2022-05-18 VITALS — BP 129/72 | HR 80 | Temp 98.0°F | Resp 20 | Ht 68.0 in | Wt 244.0 lb

## 2022-05-18 DIAGNOSIS — E118 Type 2 diabetes mellitus with unspecified complications: Secondary | ICD-10-CM

## 2022-05-18 DIAGNOSIS — I10 Essential (primary) hypertension: Secondary | ICD-10-CM

## 2022-05-18 DIAGNOSIS — E785 Hyperlipidemia, unspecified: Secondary | ICD-10-CM | POA: Diagnosis not present

## 2022-05-18 DIAGNOSIS — K219 Gastro-esophageal reflux disease without esophagitis: Secondary | ICD-10-CM

## 2022-05-18 DIAGNOSIS — I2510 Atherosclerotic heart disease of native coronary artery without angina pectoris: Secondary | ICD-10-CM

## 2022-05-18 DIAGNOSIS — E1169 Type 2 diabetes mellitus with other specified complication: Secondary | ICD-10-CM | POA: Diagnosis not present

## 2022-05-18 DIAGNOSIS — I25110 Atherosclerotic heart disease of native coronary artery with unstable angina pectoris: Secondary | ICD-10-CM | POA: Diagnosis not present

## 2022-05-18 DIAGNOSIS — Z6836 Body mass index (BMI) 36.0-36.9, adult: Secondary | ICD-10-CM

## 2022-05-18 DIAGNOSIS — Z794 Long term (current) use of insulin: Secondary | ICD-10-CM | POA: Diagnosis not present

## 2022-05-18 DIAGNOSIS — N401 Enlarged prostate with lower urinary tract symptoms: Secondary | ICD-10-CM

## 2022-05-18 DIAGNOSIS — E1159 Type 2 diabetes mellitus with other circulatory complications: Secondary | ICD-10-CM

## 2022-05-18 DIAGNOSIS — R3911 Hesitancy of micturition: Secondary | ICD-10-CM | POA: Diagnosis not present

## 2022-05-18 DIAGNOSIS — I200 Unstable angina: Secondary | ICD-10-CM

## 2022-05-18 LAB — BAYER DCA HB A1C WAIVED: HB A1C (BAYER DCA - WAIVED): 8.6 % — ABNORMAL HIGH (ref 4.8–5.6)

## 2022-05-18 MED ORDER — LISINOPRIL 2.5 MG PO TABS: 2.5000 mg | ORAL_TABLET | Freq: Every day | ORAL | 0 refills | Status: DC

## 2022-05-18 MED ORDER — LANTUS SOLOSTAR 100 UNIT/ML ~~LOC~~ SOPN: PEN_INJECTOR | SUBCUTANEOUS | 5 refills | Status: DC

## 2022-05-18 MED ORDER — METFORMIN HCL 500 MG PO TABS: ORAL_TABLET | ORAL | 1 refills | Status: DC

## 2022-05-18 MED ORDER — SEMAGLUTIDE (2 MG/DOSE) 8 MG/3ML ~~LOC~~ SOPN: 2.0000 mg | PEN_INJECTOR | SUBCUTANEOUS | 5 refills | Status: DC

## 2022-05-18 MED ORDER — TAMSULOSIN HCL 0.4 MG PO CAPS: 0.4000 mg | ORAL_CAPSULE | Freq: Every day | ORAL | 1 refills | Status: DC

## 2022-05-18 MED ORDER — CARVEDILOL 12.5 MG PO TABS: 12.5000 mg | ORAL_TABLET | Freq: Three times a day (TID) | ORAL | 1 refills | Status: DC

## 2022-05-18 MED ORDER — ATORVASTATIN CALCIUM 80 MG PO TABS: 80.0000 mg | ORAL_TABLET | Freq: Every day | ORAL | 1 refills | Status: DC

## 2022-05-18 MED ORDER — FUROSEMIDE 20 MG PO TABS: ORAL_TABLET | ORAL | 1 refills | Status: DC

## 2022-05-18 MED ORDER — ISOSORBIDE MONONITRATE ER 30 MG PO TB24: 15.0000 mg | ORAL_TABLET | Freq: Every evening | ORAL | 3 refills | Status: DC

## 2022-05-18 MED ORDER — PANTOPRAZOLE SODIUM 40 MG PO TBEC: 40.0000 mg | DELAYED_RELEASE_TABLET | Freq: Every day | ORAL | 1 refills | Status: DC | PRN

## 2022-05-18 NOTE — Patient Instructions (Signed)

## 2022-05-18 NOTE — Progress Notes (Signed)
Subjective:    Patient ID: [name] [name], male    DOB: Dec 14, 1955, 67 y.o.   MRN: [id number]   Chief Complaint: medical management of chronic issues     HPI:  [name] [name] is a 67 y.o. who identifies as a male who was assigned male at birth.   Social history: Lives with: wife Work history: retired   Scientist, forensic in today for follow up of the following chronic medical issues:  1. Essential hypertension, benign No c/o chest pain, sob or headache. Does not check blood pressure at home BP Readings from Last 3 Encounters:  04/13/22 126/72  02/13/22 129/79  10/12/21 134/70     2. Accelerating angina ([place]) No recent angina complaints  3. Coronary artery disease involving native coronary artery of native heart with unstable angina pectoris [facility]) Last saw cardiology jan 2024. [place] of office note show no change inplan of care.  4. Type 2 diabetes mellitus with complication, with long-term current use of insulin (HCC) Fasting blood sugars are running round 80-90's. Lab Results  Component Value Date   HGBA1C 7.5 (H) 02/13/2022     5. Hyperlipidemia associated with type 2 diabetes mellitus ([place]) Does try to watch diet but does no dedicated exercise. Lab Results  Component Value Date   CHOL 85 (L) 02/13/2022   HDL 21 (L) 02/13/2022   LDLCALC 36 02/13/2022   TRIG 164 (H) 02/13/2022   CHOLHDL 4.0 02/13/2022     6. Benign prostatic hyperplasia with urinary hesitancy No voiding issues.   7. Gastroesophageal reflux disease without esophagitis Has to take protonix daily to keep symptoms under control  8. BMI 36.0-36.9,adult No recent weight changes Wt Readings from Last 3 Encounters:  05/18/22 244 lb (110.7 kg)  04/13/22 246 lb 12.8 oz (111.9 kg)  02/13/22 251 lb (113.9 kg)   BMI Readings from Last 3 Encounters:  05/18/22 37.10 kg/m  04/13/22 37.53 kg/m  02/13/22 38.16 kg/m     New complaints: None today  No Known Allergies Outpatient Encounter  Medications as of 05/18/2022  Medication Sig   Accu-Chek Softclix Lancets lancets CHECK BLOOD SUGAR UP TO 4 TIMES A DAY.Dx E11.8   aspirin EC 81 MG tablet Take 1 tablet (81 mg total) by mouth daily. Swallow whole.   atorvastatin (LIPITOR) 80 MG tablet Take 1 tablet (80 mg total) by mouth daily.   blood glucose meter kit and supplies Dispense based on patient and insurance preference. Use up to four times daily as directed. (FOR ICD-10 E10.9, E11.9).   carvedilol (COREG) 12.5 MG tablet Take 1 tablet (12.5 mg total) by mouth 3 (three) times daily.   cetirizine (ZYRTEC) 10 MG tablet Take 10 mg by mouth daily.   CINNAMON PO Take 1 tablet by mouth daily.   CRANBERRY PO Take 1 tablet by mouth 2 (two) times daily.   furosemide (LASIX) 20 MG tablet Take 20 mg daily as needed for leg swelling   glucose blood (ACCU-CHEK AVIVA PLUS) test strip CHECK BLOOD SUGAR UP TO 4 TIMES A DAY.Dx E11.8   Insulin Aspart FlexPen (NOVOLOG) 100 UNIT/ML INJECT PER SLIDING SCALE 40-75 UNITS TWICE DAILY   insulin glargine (LANTUS SOLOSTAR) 100 UNIT/ML Solostar Pen 48 u in AM and 78u in PM   Insulin Pen Needle 32G X 4 MM MISC 1 each by Does not apply route daily.   isosorbide mononitrate (IMDUR) 30 MG 24 hr tablet Take 0.5 tablets (15 mg total) by mouth every evening.   lisinopril (ZESTRIL) 2.5  MG tablet TAKE 1 TABLET DAILY.   metFORMIN (GLUCOPHAGE) 500 MG tablet TAKE 2 TABLETS BY MOUTH TWICE DAILY.   nitroGLYCERIN (NITROSTAT) 0.4 MG SL tablet Place 1 tablet (0.4 mg total) under the tongue every 5 (five) minutes x 3 doses as needed for chest pain.   pantoprazole (PROTONIX) 40 MG tablet Take 1 tablet (40 mg total) by mouth daily as needed (for acid reflux).   Semaglutide, 2 MG/DOSE, 8 MG/3ML SOPN Inject 2 mg as directed once a week.   tamsulosin (FLOMAX) 0.4 MG CAPS capsule TAKE 1 CAPSULE BY MOUTH AT BEDTIME.   No facility-administered encounter medications on file as of 05/18/2022.    Past Surgical History:  Procedure  Laterality Date   CARDIAC CATHETERIZATION  2012   CAROTID STENT     stents x 2    CHOLECYSTECTOMY N/A 12/04/2015   Procedure: LAPAROSCOPIC CHOLECYSTECTOMY;  Surgeon: Aviva Signs, MD;  Location: [facility];  Service: General;  Laterality: N/A;   CHOLECYSTECTOMY, LAPAROSCOPIC  12/05/2015   CORONARY ANGIOPLASTY  2012   STENT X 1 2012, STENT X 1 YRS BEFORE   CORONARY ARTERY BYPASS GRAFT N/A 08/21/2020   Procedure: CORONARY ARTERY BYPASS GRAFTING (CABG) X 4, USING LEFT INTERNAL MAMMARY ARTERY AND RIGHT GREATER SAPHENOUS VEIN HARVESTED ENDOSCOPICALLY. SVG TO OM1, OM3 SEQUENTIALLY, SVG TO DIAG., LIMA TO LAD;  Surgeon: Melrose Nakayama, MD;  Location: [facility];  Service: Open Heart Surgery;  Laterality: N/A;   CORONARY ATHERECTOMY N/A 02/09/2020   Procedure: CORONARY ATHERECTOMY;  Surgeon: Wellington Hampshire, MD;  Location: [place] [facility];  Service: Cardiovascular;  Laterality: N/A;   CORONARY STENT INTERVENTION N/A 02/09/2020   Procedure: CORONARY STENT INTERVENTION;  Surgeon: Wellington Hampshire, MD;  Location: [place] [facility] LAB;  Service: Cardiovascular;  Laterality: N/A;   CORONARY STENT INTERVENTION N/A 05/22/2020   Procedure: CORONARY STENT INTERVENTION;  Surgeon: Jettie Booze, MD;  Location: [place] [facility];  Service: Cardiovascular;  Laterality: N/A;   ENDOVEIN HARVEST OF GREATER SAPHENOUS VEIN Right 08/21/2020   Procedure: ENDOVEIN HARVEST OF GREATER SAPHENOUS VEIN;  Surgeon: Melrose Nakayama, MD;  Location: [place];  Service: Open Heart Surgery;  Laterality: Right;   HYDROCELE EXCISION Bilateral 06/02/2017   Procedure: HYDROCELECTOMY ADULT;  Surgeon: Ceasar Mons, MD;  Location: [facility];  Service: Urology;  Laterality: Bilateral;  ONLY NEEDS 45 MIN   INGUINAL HERNIA REPAIR     RIGHT GROIN   INTRAVASCULAR IMAGING/OCT N/A 05/22/2020   Procedure: INTRAVASCULAR IMAGING/OCT;  Surgeon: Jettie Booze, MD;  Location: [place] [facility];  Service: Cardiovascular;   Laterality: N/A;   INTRAVASCULAR ULTRASOUND/IVUS N/A 02/09/2020   Procedure: Intravascular Ultrasound/IVUS;  Surgeon: Wellington Hampshire, MD;  Location: [place] [facility];  Service: Cardiovascular;  Laterality: N/A;   KNEE ARTHROSCOPY Left 05/23/2019   Procedure: LEFT KNEE ARTHROSCOPY AND DEBRIDEMENT PARTIAL MEDIAL MENISECTOMY;  Surgeon: Newt Minion, MD;  Location: [place];  Service: Orthopedics;  Laterality: Left;   LEFT HEART CATH AND CORONARY ANGIOGRAPHY N/A 02/07/2020   Procedure: LEFT HEART CATH AND CORONARY ANGIOGRAPHY;  Surgeon: Leonie Man, MD;  Location: [place] [facility];  Service: Cardiovascular;  Laterality: N/A;   LEFT HEART CATH AND CORONARY ANGIOGRAPHY N/A 05/22/2020   Procedure: LEFT HEART CATH AND CORONARY ANGIOGRAPHY;  Surgeon: Jettie Booze, MD;  Location: [place] [facility] LAB;  Service: Cardiovascular;  Laterality: N/A;   LEFT HEART CATH AND CORONARY ANGIOGRAPHY N/A 08/15/2020   Procedure:  LEFT HEART CATH AND CORONARY ANGIOGRAPHY;  Surgeon: Lorretta Harp, MD;  Location: [place] [facility];  Service: Cardiovascular;  Laterality: N/A;   TEE WITHOUT CARDIOVERSION N/A 08/21/2020   Procedure: TRANSESOPHAGEAL ECHOCARDIOGRAM (TEE);  Surgeon: Melrose Nakayama, MD;  Location: [place];  Service: Open Heart Surgery;  Laterality: N/A;   TRIGGER FINGER RELEASE Right 01/08/2015   Procedure: RELEASE TRIGGER FINGER/A-1 PULLEY RIGHT MIDDLE FINGER;  Surgeon: Daryll Brod, MD;  Location: [place];  Service: Orthopedics;  Laterality: Right;    Family History  Problem Relation Age of Onset   Hyperlipidemia Mother    Hypertension Mother    Hyperlipidemia Father    Hypertension Father    Diabetes Father    Dementia Father    Diabetes Maternal Grandfather       Controlled substance contract: n/a     Review of Systems  Constitutional:  Negative for diaphoresis.  Eyes:  Negative for pain.  Respiratory:  Negative for shortness of  breath.   Cardiovascular:  Negative for chest pain, palpitations and leg swelling.  Gastrointestinal:  Negative for abdominal pain.  Endocrine: Negative for polydipsia.  Skin:  Negative for rash.  Neurological:  Negative for dizziness, weakness and headaches.  Hematological:  Does not bruise/bleed easily.  All other systems reviewed and are negative.      Objective:   Physical Exam Vitals and nursing note reviewed.  Constitutional:      Appearance: Normal appearance. He is well-developed.  HENT:     Head: Normocephalic.     Nose: Nose normal.     Mouth/Throat:     Mouth: Mucous membranes are moist.     Pharynx: Oropharynx is clear.  Eyes:     Pupils: Pupils are equal, round, and reactive to light.  Neck:     Thyroid: No thyroid mass or thyromegaly.     Vascular: No carotid bruit or JVD.     Trachea: Phonation normal.  Cardiovascular:     Rate and Rhythm: Normal rate and regular rhythm.  Pulmonary:     Effort: Pulmonary effort is normal. No respiratory distress.     Breath sounds: Normal breath sounds.  Abdominal:     General: Bowel sounds are normal.     Palpations: Abdomen is soft.     Tenderness: There is no abdominal tenderness.     Hernia: A hernia (soft unbilical hernia) is present.  Musculoskeletal:        General: Normal range of motion.     Cervical back: Normal range of motion and neck supple.  Lymphadenopathy:     Cervical: No cervical adenopathy.  Skin:    General: Skin is warm and dry.  Neurological:     Mental Status: He is alert and oriented to person, place, and time.  Psychiatric:        Behavior: Behavior normal.        Thought Content: Thought content normal.        Judgment: Judgment normal.    BP 129/72   Pulse 80   Temp 98 F (36.7 C) (Temporal)   Resp 20   Ht 5' 8"$  (1.727 m)   Wt 244 lb (110.7 kg)   SpO2 96%   BMI 37.10 kg/m   HGBa1c 8.6%       Assessment & Plan:  [name] [name] comes in today with chief complaint of Medical  Management of Chronic Issues   Diagnosis and orders addressed:  1. Essential hypertension, benign Low  sodium diet - CBC with Differential/Platelet - CMP14+EGFR - lisinopril (ZESTRIL) 2.5 MG tablet; Take 1 tablet (2.5 mg total) by mouth daily.  Dispense: 90 tablet; Refill: 0  2. Accelerating angina ([place]) 3. Coronary artery disease involving native coronary artery of native heart with unstable angina pectoris [facility]) Keep follow up with cardiology  4. Type 2 diabetes mellitus with other circulatory complication, with long-term current use of insulin (HCC) - metFORMIN (GLUCOPHAGE) 500 MG tablet; TAKE 2 TABLETS BY MOUTH TWICE DAILY.  Dispense: 120 tablet; Refill: 1 - Semaglutide, 2 MG/DOSE, 8 MG/3ML SOPN; Inject 2 mg as directed once a week.  Dispense: 3 mL; Refill: 5 - insulin glargine (LANTUS SOLOSTAR) 100 UNIT/ML Solostar Pen; 52 u in AM and 78u in PM  Dispense: 30 mL; Refill: 5 Stricter carb counting Increase lantus to 52u in morning and continue 78u at night - Bayer DCA Hb A1c Waived  5. Hyperlipidemia associated with type 2 diabetes mellitus (HCC) [place] fat diet - Lipid panel - atorvastatin (LIPITOR) 80 MG tablet; Take 1 tablet (80 mg total) by mouth daily.  Dispense: 90 tablet; Refill: 1  6. Benign prostatic hyperplasia with urinary hesitancy Report any voiding issues - PSA, total and free - tamsulosin (FLOMAX) 0.4 MG CAPS capsule; Take 1 capsule (0.4 mg total) by mouth at bedtime.  Dispense: 90 capsule; Refill: 1  7. Gastroesophageal reflux disease without esophagitis Avoid spicy foods Do not eat 2 hours prior to bedtime  - pantoprazole (PROTONIX) 40 MG tablet; Take 1 tablet (40 mg total) by mouth daily as needed (for acid reflux).  Dispense: 90 tablet; Refill: 1  8. BMI 36.0-36.9,adult Discussed diet and exercise for person with BMI >25 Will recheck weight in 3-6 months   9. Coronary artery disease involving native coronary artery of native heart without angina  pectoris Keep follow  up with cardiology - carvedilol (COREG) 12.5 MG tablet; Take 1 tablet (12.5 mg total) by mouth 3 (three) times daily.  Dispense: 270 tablet; Refill: 1 - furosemide (LASIX) 20 MG tablet; Take 20 mg daily as needed for leg swelling  Dispense: 90 tablet; Refill: 1 - isosorbide mononitrate (IMDUR) 30 MG 24 hr tablet; Take 0.5 tablets (15 mg total) by mouth every evening.  Dispense: 45 tablet; Refill: 3    Labs pending Health Maintenance reviewed Diet and exercise encouraged  Follow up plan: 3 months   [name], FNP

## 2022-05-19 LAB — CMP14+EGFR
ALT: 29 IU/L (ref 0–44)
AST: 21 IU/L (ref 0–40)
Albumin/Globulin Ratio: 1.7 (ref 1.2–2.2)
Albumin: 4.2 g/dL (ref 3.9–4.9)
Alkaline Phosphatase: 62 IU/L (ref 44–121)
BUN/Creatinine Ratio: 20 (ref 10–24)
BUN: 21 mg/dL (ref 8–27)
Bilirubin Total: 0.5 mg/dL (ref 0.0–1.2)
CO2: 23 mmol/L (ref 20–29)
Calcium: 9.5 mg/dL (ref 8.6–10.2)
Chloride: 98 mmol/L (ref 96–106)
Creatinine, Ser: 1.03 mg/dL (ref 0.76–1.27)
Globulin, Total: 2.5 g/dL (ref 1.5–4.5)
Glucose: 175 mg/dL — ABNORMAL HIGH (ref 70–99)
Potassium: 4.8 mmol/L (ref 3.5–5.2)
Sodium: 140 mmol/L (ref 134–144)
Total Protein: 6.7 g/dL (ref 6.0–8.5)
eGFR: 79 mL/min/{1.73_m2} (ref >59)

## 2022-05-19 LAB — CBC WITH DIFFERENTIAL/PLATELET
Basophils Absolute: 0 10*3/uL (ref 0.0–0.2)
Basos: 0 %
EOS (ABSOLUTE): 0.2 10*3/uL (ref 0.0–0.4)
Eos: 3 %
Hematocrit: 41.7 % (ref 37.5–51.0)
Hemoglobin: 13.2 g/dL (ref 13.0–17.7)
Immature Grans (Abs): 0 10*3/uL (ref 0.0–0.1)
Immature Granulocytes: 0 %
Lymphocytes Absolute: 2 10*3/uL (ref 0.7–3.1)
Lymphs: 28 %
MCH: 26.5 pg — ABNORMAL LOW (ref 26.6–33.0)
MCHC: 31.7 g/dL (ref 31.5–35.7)
MCV: 84 fL (ref 79–97)
Monocytes Absolute: 0.7 10*3/uL (ref 0.1–0.9)
Monocytes: 11 %
Neutrophils Absolute: 4 10*3/uL (ref 1.4–7.0)
Neutrophils: 58 %
Platelets: 283 10*3/uL (ref 150–450)
RBC: 4.98 x10E6/uL (ref 4.14–5.80)
RDW: 14.5 % (ref 11.6–15.4)
WBC: 7 10*3/uL (ref 3.4–10.8)

## 2022-05-19 LAB — LIPID PANEL
Chol/HDL Ratio: 4.1 ratio (ref 0.0–5.0)
Cholesterol, Total: 90 mg/dL — ABNORMAL LOW (ref 100–199)
HDL: 22 mg/dL — ABNORMAL LOW (ref >39)
LDL Chol Calc (NIH): 39 mg/dL (ref 0–99)
Triglycerides: 175 mg/dL — ABNORMAL HIGH (ref 0–149)
VLDL Cholesterol Cal: 29 mg/dL (ref 5–40)

## 2022-05-19 LAB — PSA, TOTAL AND FREE
PSA, Free Pct: 25.6 %
PSA, Free: 0.69 ng/mL
Prostate Specific Ag, Serum: 2.7 ng/mL (ref 0.0–4.0)

## 2022-05-22 ENCOUNTER — Other Ambulatory Visit: Payer: Self-pay | Admitting: Nurse Practitioner

## 2022-05-22 DIAGNOSIS — E118 Type 2 diabetes mellitus with unspecified complications: Secondary | ICD-10-CM

## 2022-06-02 ENCOUNTER — Other Ambulatory Visit: Payer: Self-pay | Admitting: Nurse Practitioner

## 2022-06-02 DIAGNOSIS — N401 Enlarged prostate with lower urinary tract symptoms: Secondary | ICD-10-CM

## 2022-06-09 ENCOUNTER — Other Ambulatory Visit: Payer: Self-pay | Admitting: Nurse Practitioner

## 2022-06-09 DIAGNOSIS — I2510 Atherosclerotic heart disease of native coronary artery without angina pectoris: Secondary | ICD-10-CM

## 2022-06-16 DIAGNOSIS — I10 Essential (primary) hypertension: Secondary | ICD-10-CM | POA: Diagnosis not present

## 2022-06-16 DIAGNOSIS — Z7984 Long term (current) use of oral hypoglycemic drugs: Secondary | ICD-10-CM | POA: Diagnosis not present

## 2022-06-16 DIAGNOSIS — R42 Dizziness and giddiness: Secondary | ICD-10-CM | POA: Diagnosis not present

## 2022-06-16 DIAGNOSIS — Z7982 Long term (current) use of aspirin: Secondary | ICD-10-CM | POA: Diagnosis not present

## 2022-06-16 DIAGNOSIS — R112 Nausea with vomiting, unspecified: Secondary | ICD-10-CM | POA: Diagnosis not present

## 2022-06-16 DIAGNOSIS — E119 Type 2 diabetes mellitus without complications: Secondary | ICD-10-CM | POA: Diagnosis not present

## 2022-06-16 DIAGNOSIS — E785 Hyperlipidemia, unspecified: Secondary | ICD-10-CM | POA: Diagnosis not present

## 2022-06-16 DIAGNOSIS — I252 Old myocardial infarction: Secondary | ICD-10-CM | POA: Diagnosis not present

## 2022-06-23 ENCOUNTER — Telehealth: Payer: Self-pay

## 2022-06-23 NOTE — Telephone Encounter (Signed)
Patient needs a prior authorization on his Insulin glargine solostar. Patient brought a letter by the office.

## 2022-06-26 ENCOUNTER — Other Ambulatory Visit: Payer: Self-pay | Admitting: Nurse Practitioner

## 2022-06-26 ENCOUNTER — Other Ambulatory Visit (HOSPITAL_COMMUNITY): Payer: Self-pay

## 2022-06-26 DIAGNOSIS — Z794 Long term (current) use of insulin: Secondary | ICD-10-CM

## 2022-06-29 ENCOUNTER — Other Ambulatory Visit: Payer: Self-pay | Admitting: Nurse Practitioner

## 2022-07-16 ENCOUNTER — Other Ambulatory Visit: Payer: Self-pay | Admitting: Nurse Practitioner

## 2022-07-16 DIAGNOSIS — Z794 Long term (current) use of insulin: Secondary | ICD-10-CM

## 2022-07-29 ENCOUNTER — Telehealth: Payer: Self-pay | Admitting: Nurse Practitioner

## 2022-07-29 ENCOUNTER — Ambulatory Visit (INDEPENDENT_AMBULATORY_CARE_PROVIDER_SITE_OTHER): Payer: Medicare PPO

## 2022-07-29 VITALS — Ht 69.0 in | Wt 240.0 lb

## 2022-07-29 DIAGNOSIS — Z0000 Encounter for general adult medical examination without abnormal findings: Secondary | ICD-10-CM | POA: Diagnosis not present

## 2022-07-29 NOTE — Progress Notes (Addendum)
Subjective:   [name] [name] is a 67 y.o. male who presents for Medicare Annual/Subsequent preventive examination. I connected with  Burnard Leigh on 07/29/22 by a audio enabled telemedicine application and verified that I am speaking with the correct person using two identifiers.  Patient Location: Home  Provider Location: Home Office  I discussed the limitations of evaluation and management by telemedicine. The patient expressed understanding and agreed to proceed.  Review of Systems     Cardiac Risk Factors include: advanced age (>20men, >46 women);diabetes mellitus;male gender;dyslipidemia;hypertension     Objective:    Today's Vitals   07/29/22 0820  Weight: 240 lb (108.9 kg)  Height: 5\' 9"  (1.753 m)   Body mass index is 35.44 kg/m.     07/29/2022    8:24 AM 10/12/2021    4:41 PM 07/23/2021    8:23 AM 01/16/2021    8:29 AM 08/18/2020    9:42 PM 08/15/2020    7:34 AM 07/22/2020    8:43 AM  Advanced Directives  Does Patient Have a Medical Advance Directive? No No Yes No No No No  Type of Surveyor, minerals;Living will      Copy of Healthcare Power of Attorney in Chart?   No - copy requested      Would patient like information on creating a medical advance directive? No - Patient declined No - Patient declined No - Patient declined No - Patient declined No - Patient declined No - Patient declined No - Patient declined    Current Medications (verified) Outpatient Encounter Medications as of 07/29/2022  Medication Sig   Accu-Chek Softclix Lancets lancets CHECK BLOOD SUGAR UP TO 4 TIMES A DAY.Dx E11.8   aspirin EC 81 MG tablet Take 1 tablet (81 mg total) by mouth daily. Swallow whole.   atorvastatin (LIPITOR) 80 MG tablet Take 1 tablet (80 mg total) by mouth daily.   blood glucose meter kit and supplies Dispense based on patient and insurance preference. Use up to four times daily as directed. (FOR ICD-10 E10.9, E11.9).   carvedilol (COREG) 12.5  MG tablet TAKE 1 TABLET BY MOUTH 3 TIMES DAILY.   cetirizine (ZYRTEC) 10 MG tablet Take 10 mg by mouth daily.   CINNAMON PO Take 1 tablet by mouth daily.   CRANBERRY PO Take 1 tablet by mouth 2 (two) times daily.   furosemide (LASIX) 20 MG tablet Take 20 mg daily as needed for leg swelling   glucose blood (ACCU-CHEK AVIVA PLUS) test strip CHECK BLOOD SUGAR UP TO 4 TIMES A DAY.Dx E11.8   Insulin Aspart FlexPen (NOVOLOG) 100 UNIT/ML INJECT PER SLIDING SCALE 40-75 UNITS TWICE DAILY   insulin glargine (LANTUS SOLOSTAR) 100 UNIT/ML Solostar Pen inject 48 units in the morning and 78 units in the evening.   Insulin Pen Needle (B-D ULTRAFINE III SHORT PEN) 31G X 8 MM MISC USE AS DIRECTED UP TO 8 TIMES A DAY AS NEEDED FOR INSULIN   Insulin Pen Needle 32G X 4 MM MISC 1 each by Does not apply route daily.   isosorbide mononitrate (IMDUR) 30 MG 24 hr tablet Take 0.5 tablets (15 mg total) by mouth every evening.   lisinopril (ZESTRIL) 2.5 MG tablet Take 1 tablet (2.5 mg total) by mouth daily.   metFORMIN (GLUCOPHAGE) 500 MG tablet TAKE 2 TABLETS BY MOUTH TWICE DAILY.   nitroGLYCERIN (NITROSTAT) 0.4 MG SL tablet Place 1 tablet (0.4 mg total) under the tongue every 5 (five) minutes  x 3 doses as needed for chest pain.   pantoprazole (PROTONIX) 40 MG tablet Take 1 tablet (40 mg total) by mouth daily as needed (for acid reflux).   Semaglutide, 2 MG/DOSE, 8 MG/3ML SOPN Inject 2 mg as directed once a week.   tamsulosin (FLOMAX) 0.4 MG CAPS capsule TAKE 1 CAPSULE BY MOUTH AT BEDTIME.   No facility-administered encounter medications on file as of 07/29/2022.    Allergies (verified) Patient has no known allergies.   History: Past Medical History:  Diagnosis Date   Anxiety    Coronary atherosclerosis of native coronary artery    DES distal circumflex 2005; DES LAD/diagonal bifurcation 09/2010; DES ostial LAD and DES left PL 01/2020; CABG May 2022 (LIMA to LAD, SVG to diagonal, SVG to OM1 and OM 3)   Essential  hypertension    GERD (gastroesophageal reflux disease)    History of kidney stones    Mixed hyperlipidemia    Myocardial infarction (HCC)    Anterolateral with VF arrest 7/12   Type 2 diabetes mellitus (HCC)    Past Surgical History:  Procedure Laterality Date   CARDIAC CATHETERIZATION  2012   CAROTID STENT     stents x 2    CHOLECYSTECTOMY N/A 12/04/2015   Procedure: LAPAROSCOPIC CHOLECYSTECTOMY;  Surgeon: Franky Macho, MD;  Location: [facility];  Service: General;  Laterality: N/A;   CHOLECYSTECTOMY, LAPAROSCOPIC  12/05/2015   CORONARY ANGIOPLASTY  2012   STENT X 1 2012, STENT X 1 YRS BEFORE   CORONARY ARTERY BYPASS GRAFT N/A 08/21/2020   Procedure: CORONARY ARTERY BYPASS GRAFTING (CABG) X 4, USING LEFT INTERNAL MAMMARY ARTERY AND RIGHT GREATER SAPHENOUS VEIN HARVESTED ENDOSCOPICALLY. SVG TO OM1, OM3 SEQUENTIALLY, SVG TO DIAG., LIMA TO LAD;  Surgeon: Loreli Slot, MD;  Location: [facility] OR;  Service: Open Heart Surgery;  Laterality: N/A;   CORONARY ATHERECTOMY N/A 02/09/2020   Procedure: CORONARY ATHERECTOMY;  Surgeon: Iran Ouch, MD;  Location: [facility] INVASIVE CV LAB;  Service: Cardiovascular;  Laterality: N/A;   CORONARY IMAGING/OCT N/A 05/22/2020   Procedure: INTRAVASCULAR IMAGING/OCT;  Surgeon: Corky Crafts, MD;  Location: [facility] INVASIVE CV LAB;  Service: Cardiovascular;  Laterality: N/A;   CORONARY STENT INTERVENTION N/A 02/09/2020   Procedure: CORONARY STENT INTERVENTION;  Surgeon: Iran Ouch, MD;  Location: [facility] INVASIVE CV LAB;  Service: Cardiovascular;  Laterality: N/A;   CORONARY STENT INTERVENTION N/A 05/22/2020   Procedure: CORONARY STENT INTERVENTION;  Surgeon: Corky Crafts, MD;  Location: [facility] INVASIVE CV LAB;  Service: Cardiovascular;  Laterality: N/A;   CORONARY ULTRASOUND/IVUS N/A 02/09/2020   Procedure: Intravascular Ultrasound/IVUS;  Surgeon: Iran Ouch, MD;  Location: [facility] INVASIVE CV LAB;  Service: Cardiovascular;  Laterality: N/A;   ENDOVEIN  HARVEST OF GREATER SAPHENOUS VEIN Right 08/21/2020   Procedure: ENDOVEIN HARVEST OF GREATER SAPHENOUS VEIN;  Surgeon: Loreli Slot, MD;  Location: [facility] OR;  Service: Open Heart Surgery;  Laterality: Right;   HYDROCELE EXCISION Bilateral 06/02/2017   Procedure: HYDROCELECTOMY ADULT;  Surgeon: Rene Paci, MD;  Location: [facility];  Service: Urology;  Laterality: Bilateral;  ONLY NEEDS 45 MIN   INGUINAL HERNIA REPAIR     RIGHT GROIN   KNEE ARTHROSCOPY Left 05/23/2019   Procedure: LEFT KNEE ARTHROSCOPY AND DEBRIDEMENT PARTIAL MEDIAL MENISECTOMY;  Surgeon: Nadara Mustard, MD;  Location: [facility];  Service: Orthopedics;  Laterality: Left;   LEFT HEART CATH AND CORONARY ANGIOGRAPHY N/A 02/07/2020   Procedure: LEFT HEART CATH AND CORONARY ANGIOGRAPHY;  Surgeon: Marykay Lex, MD;  Location: [facility] INVASIVE CV LAB;  Service: Cardiovascular;  Laterality: N/A;   LEFT HEART CATH AND CORONARY ANGIOGRAPHY N/A 05/22/2020   Procedure: LEFT HEART CATH AND CORONARY ANGIOGRAPHY;  Surgeon: Corky Crafts, MD;  Location: [facility] INVASIVE CV LAB;  Service: Cardiovascular;  Laterality: N/A;   LEFT HEART CATH AND CORONARY ANGIOGRAPHY N/A 08/15/2020   Procedure: LEFT HEART CATH AND CORONARY ANGIOGRAPHY;  Surgeon: Runell Gess, MD;  Location: [facility] INVASIVE CV LAB;  Service: Cardiovascular;  Laterality: N/A;   TEE WITHOUT CARDIOVERSION N/A 08/21/2020   Procedure: TRANSESOPHAGEAL ECHOCARDIOGRAM (TEE);  Surgeon: Loreli Slot, MD;  Location: [facility] OR;  Service: Open Heart Surgery;  Laterality: N/A;   TRIGGER FINGER RELEASE Right 01/08/2015   Procedure: RELEASE TRIGGER FINGER/A-1 PULLEY RIGHT MIDDLE FINGER;  Surgeon: Cindee Salt, MD;  Location: [facility];  Service: Orthopedics;  Laterality: Right;   Family History  Problem Relation Age of Onset   Hyperlipidemia Mother    Hypertension Mother    Hyperlipidemia Father    Hypertension Father    Diabetes Father    Dementia  Father    Diabetes Maternal Grandfather    Social History   Socioeconomic History   Marital status: Married    Spouse name: Ann   Number of children: 1   Years of education: 18   Highest education level: Manufacturing engineer (e.g., MA, MS, MEng, MEd, MSW, MBA)  Occupational History   Occupation: Retired    Associate Professor: [employer]  Tobacco Use   Smoking status: Never   Smokeless tobacco: Never  Vaping Use   Vaping Use: Never used  Substance and Sexual Activity   Alcohol use: No   Drug use: No   Sexual activity: Yes  Other Topics Concern   Not on file  Social History Narrative   Retired Buyer, retail; Lives with his wife.   1 son and family, lives close by.   Social Determinants of Health   Financial Resource Strain: Low Risk  (07/29/2022)   Overall Financial Resource Strain (CARDIA)    Difficulty of Paying Living Expenses: Not hard at all  Food Insecurity: No Food Insecurity (07/29/2022)   Hunger Vital Sign    Worried About Running Out of Food in the Last Year: Never true    Ran Out of Food in the Last Year: Never true  Transportation Needs: No Transportation Needs (07/29/2022)   PRAPARE - Administrator, Civil Service (Medical): No    Lack of Transportation (Non-Medical): No  Physical Activity: Sufficiently Active (07/29/2022)   Exercise Vital Sign    Days of Exercise per Week: 5 days    Minutes of Exercise per Session: 30 min  Stress: No Stress Concern Present (07/29/2022)   [employer] of Occupational Health - Occupational Stress Questionnaire    Feeling of Stress : Not at all  Social Connections: Moderately Integrated (07/29/2022)   Social Connection and Isolation Panel [NHANES]    Frequency of Communication with Friends and Family: More than three times a week    Frequency of Social Gatherings with Friends and Family: More than three times a week    Attends Religious Services: More than 4 times per year    Active Member of [employer] or Organizations: No     Attends Banker Meetings: Never    Marital Status: Married    Tobacco Counseling Counseling given: Not Answered   Clinical Intake:  Pre-visit preparation completed: Yes  Pain :  No/denies pain     Nutritional Risks: None Diabetes: Yes CBG done?: No Did pt. bring in CBG monitor from home?: No  How often do you need to have someone help you when you read instructions, pamphlets, or other written materials from your doctor or pharmacy?: 1 - Never  Diabetic?yes Nutrition Risk Assessment:  Has the patient had any N/V/D within the last 2 months?  No  Does the patient have any non-healing wounds?  No  Has the patient had any unintentional weight loss or weight gain?  No   Diabetes:  Is the patient diabetic?  Yes  If diabetic, was a CBG obtained today?  No  Did the patient bring in their glucometer from home?  No  How often do you monitor your CBG's? 3 x day .   Financial Strains and Diabetes Management:  Are you having any financial strains with the device, your supplies or your medication? No .  Does the patient want to be seen by Chronic Care Management for management of their diabetes?  No  Would the patient like to be referred to a Nutritionist or for Diabetic Management?  No   Diabetic Exams:  Diabetic Eye Exam: Completed 02/2022 Diabetic Foot Exam: Overdue, Pt has been advised about the importance in completing this exam. Pt is scheduled for diabetic foot exam on next office visit .   Interpreter Needed?: No  Information entered by :: Renie Ora, LPN   Activities of Daily Living    07/29/2022    8:24 AM 07/28/2022    3:50 PM  In your present state of health, do you have any difficulty performing the following activities:  Hearing? 0 0  Vision? 0 0  Difficulty concentrating or making decisions? 0 0  Walking or climbing stairs? 0 0  Dressing or bathing? 0 0  Doing errands, shopping? 0 0  Preparing Food and eating ? N N  Using the Toilet? N N   In the past six months, have you accidently leaked urine? N N  Do you have problems with loss of bowel control? N N  Managing your Medications? N N  Managing your Finances? N N  Housekeeping or managing your Housekeeping? N N    Patient Care Team: Bennie Pierini, FNP as PCP - General (Family Medicine) Jonelle Sidle, MD as PCP - Cardiology (Cardiology) Danella Maiers, [facility] (Pharmacist)  Indicate any recent Medical Services you may have received from other than [facility] providers in the past year (date may be approximate).     Assessment:   This is a routine wellness examination for Ary.  Hearing/Vision screen Vision Screening - Comments:: Wears rx glasses - up to date with routine eye exams with  Dr.Groat   Dietary issues and exercise activities discussed: Current Exercise Habits: Home exercise routine, Type of exercise: walking, Time (Minutes): 30, Frequency (Times/Week): 5, Weekly Exercise (Minutes/Week): 150, Intensity: Mild, Exercise limited by: None identified   Goals Addressed             This Visit's Progress    Have 3 meals a day   On track    07/20/2019 AWV Goal: Improved Nutrition/Diet  Patient will verbalize understanding that diet plays an important role in overall health and that a poor diet is a risk factor for many chronic medical conditions.  Over the next year, patient will improve self management of their diet by incorporating decreased fat intake, fewer sweetened foods & beverages, increased physical activity, and better food choices.  Patient will utilize available community resources to help with food acquisition if needed (ex: food pantries, [address], etc) Patient will work with nutrition specialist if a referral was made        Depression Screen    07/29/2022    8:23 AM 05/18/2022    1:50 PM 02/13/2022    2:52 PM 08/12/2021    2:14 PM 08/05/2021   12:13 PM 07/23/2021    8:20 AM 05/09/2021    3:04 PM  PHQ 2/9 Scores  PHQ - 2 Score 0 0 1 0  2 0 2  PHQ- 9 Score  0 3 4 7  5     Fall Risk    07/29/2022    8:21 AM 07/28/2022    3:50 PM 05/18/2022    1:50 PM 02/13/2022    2:52 PM 08/12/2021    2:14 PM  Fall Risk   Falls in the past year? 0 0 0 0 0  Number falls in past yr: 0 0     Injury with Fall? 0 0     Risk for fall due to : No Fall Risks      Follow up Falls prevention discussed        FALL RISK PREVENTION PERTAINING TO THE HOME:  Any stairs in or around the home? Yes  If so, are there any without handrails? No  Home free of loose throw rugs in walkways, pet beds, electrical cords, etc? Yes  Adequate lighting in your home to reduce risk of falls? Yes   ASSISTIVE DEVICES UTILIZED TO PREVENT FALLS:  Life alert? No  Use of a cane, walker or w/c? No  Grab bars in the bathroom? Yes  Shower chair or bench in shower? No  Elevated toilet seat or a handicapped toilet? No          07/29/2022    8:24 AM 07/23/2021    8:26 AM 07/20/2019    8:47 AM  6CIT Screen  What Year? 0 points 0 points 0 points  What month? 0 points 0 points 0 points  What time? 0 points 0 points 0 points  Count back from 20 0 points 0 points 0 points  Months in reverse 0 points 0 points 0 points  Repeat phrase 0 points 0 points 0 points  Total Score 0 points 0 points 0 points    Immunizations Immunization History  Administered Date(s) Administered   Fluad Quad(high Dose 65+) 12/25/2019, 02/13/2022   Influenza, High Dose Seasonal PF 01/17/2019   Influenza,inj,Quad PF,6+ Mos 04/11/2014, 02/07/2018   Influenza-Unspecified 11/28/2020   PFIZER(Purple Top)SARS-COV-2 Vaccination 05/06/2019, 05/27/2019, 12/25/2019, 08/02/2020   Pneumococcal Conjugate-13 11/06/2020   Tdap 02/13/2022   Zoster Recombinat (Shingrix) 11/06/2020, 02/06/2021    TDAP status: Up to date  Flu Vaccine status: Up to date  Pneumococcal vaccine status: Up to date  Covid-19 vaccine status: Completed vaccines  Qualifies for Shingles Vaccine? Yes   Zostavax completed Yes    Shingrix Completed?: Yes  Screening Tests Health Maintenance  Topic Date Due   Hepatitis C Screening  Never done   COVID-19 Vaccine (5 - 2023-24 season) 11/28/2021   Pneumonia Vaccine 51+ Years old (2 of 2 - PPSV23 or PCV20) 02/14/2023 (Originally 11/06/2021)   Fecal DNA (Cologuard)  08/16/2022   INFLUENZA VACCINE  10/29/2022   HEMOGLOBIN A1C  11/16/2022   Diabetic kidney evaluation - Urine ACR  02/14/2023   OPHTHALMOLOGY EXAM  03/10/2023   Diabetic kidney evaluation - eGFR measurement  05/19/2023  FOOT EXAM  05/19/2023   Medicare Annual Wellness (AWV)  07/29/2023   DTaP/Tdap/Td (2 - Td or Tdap) 02/14/2032   Zoster Vaccines- Shingrix  Completed   HPV VACCINES  Aged Out    Health Maintenance  Health Maintenance Due  Topic Date Due   Hepatitis C Screening  Never done   COVID-19 Vaccine (5 - 2023-24 season) 11/28/2021    Colorectal cancer screening: Type of screening: Cologuard. Completed 08/16/2019. Repeat every 3 years  Lung Cancer Screening: (Low Dose CT Chest recommended if Age 37-80 years, 30 pack-year currently smoking OR have quit w/in 15years.) does not qualify.   Lung Cancer Screening Referral: n/a  Additional Screening:  Hepatitis C Screening: does not qualify;   Vision Screening: Recommended annual ophthalmology exams for early detection of glaucoma and other disorders of the eye. Is the patient up to date with their annual eye exam?  Yes  Who is the provider or what is the name of the office in which the patient attends annual eye exams? Dr.Groat  If pt is not established with a provider, would they like to be referred to a provider to establish care? No .   Dental Screening: Recommended annual dental exams for proper oral hygiene  Community Resource Referral / Chronic Care Management: CRR required this visit?  No   CCM required this visit?  No      Plan:     I have personally reviewed and noted the following in the patient's chart:   Medical and  social history Use of alcohol, tobacco or illicit drugs  Current medications and supplements including opioid prescriptions. Patient is not currently taking opioid prescriptions. Functional ability and status Nutritional status Physical activity Advanced directives List of other physicians Hospitalizations, surgeries, and ER visits in previous 12 months Vitals Screenings to include cognitive, depression, and falls Referrals and appointments  In addition, I have reviewed and discussed with patient certain preventive protocols, quality metrics, and best practice recommendations. A written personalized care plan for preventive services as well as general preventive health recommendations were provided to patient.     Lorrene Reid, LPN   04/03/7827   Nurse Notes: none   I have reviewed and agree with the above AWV documentation.   Mary-Margaret Daphine Deutscher, FNP

## 2022-07-29 NOTE — Patient Instructions (Signed)
[name] [name] , Thank you for taking time to come for your Medicare Wellness Visit. I appreciate your ongoing commitment to your health goals. Please review the following plan we discussed and let me know if I can assist you in the future.   These are the goals we discussed:  Goals      Exercise 3x per week (30 min per time)     07/20/2019 AWV Goal: Exercise for General Health  Patient will verbalize understanding of the benefits of increased physical activity: Exercising regularly is important. It will improve your overall fitness, flexibility, and endurance. Regular exercise also will improve your overall health. It can help you control your weight, reduce stress, and improve your bone density. Over the next year, patient will increase physical activity as tolerated with a goal of at least 150 minutes of moderate physical activity per week.  You can tell that you are exercising at a moderate intensity if your heart starts beating faster and you start breathing faster but can still hold a conversation. Moderate-intensity exercise ideas include: Walking 1 mile (1.6 km) in about 15 minutes Biking Hiking Golfing Dancing Water aerobics Patient will verbalize understanding of everyday activities that increase physical activity by providing examples like the following: Yard work, such as: Insurance underwriter Gardening Washing windows or floors Patient will be able to explain general safety guidelines for exercising:  Before you start a new exercise program, talk with your health care provider. Do not exercise so much that you hurt yourself, feel dizzy, or get very short of breath. Wear comfortable clothes and wear shoes with good support. Drink plenty of water while you exercise to prevent dehydration or heat stroke. Work out until your breathing and your heartbeat get faster.      Have 3 meals a day      07/20/2019 AWV Goal: Improved Nutrition/Diet  Patient will verbalize understanding that diet plays an important role in overall health and that a poor diet is a risk factor for many chronic medical conditions.  Over the next year, patient will improve self management of their diet by incorporating decreased fat intake, fewer sweetened foods & beverages, increased physical activity, and better food choices. Patient will utilize available community resources to help with food acquisition if needed (ex: food pantries, [address], etc) Patient will work with nutrition specialist if a referral was made      Weight (lb) < 200 lb (90.7 kg)        This is a list of the screening recommended for you and due dates:  Health Maintenance  Topic Date Due   Hepatitis C Screening: USPSTF Recommendation to screen - Ages 9-79 yo.  Never done   COVID-19 Vaccine (5 - 2023-24 season) 11/28/2021   Pneumonia Vaccine (2 of 2 - PPSV23 or PCV20) 02/14/2023*   Cologuard (Stool DNA test)  08/16/2022   Flu Shot  10/29/2022   Hemoglobin A1C  11/16/2022   Yearly kidney health urinalysis for diabetes  02/14/2023   Eye exam for diabetics  03/10/2023   Yearly kidney function blood test for diabetes  05/19/2023   Complete foot exam   05/19/2023   Medicare Annual Wellness Visit  07/29/2023   DTaP/Tdap/Td vaccine (2 - Td or Tdap) 02/14/2032   Zoster (Shingles) Vaccine  Completed   HPV Vaccine  Aged Out  *Topic was postponed. The date shown is not the original due date.  Advanced directives: Advance directive discussed with you today. I have provided a copy for you to complete at home and have notarized. Once this is complete please bring a copy in to our office so we can scan it into your chart.   Conditions/risks identified: Aim for 30 minutes of exercise or brisk walking, 6-8 glasses of water, and 5 servings of fruits and vegetables each day.   Next appointment: Follow up in one year for your annual wellness visit.    Preventive Care 69 Years and Older, Male  Preventive care refers to lifestyle choices and visits with your health care provider that can promote health and wellness. What does preventive care include? A yearly physical exam. This is also called an annual well check. Dental exams once or twice a year. Routine eye exams. Ask your health care provider how often you should have your eyes checked. Personal lifestyle choices, including: Daily care of your teeth and gums. Regular physical activity. Eating a healthy diet. Avoiding tobacco and drug use. Limiting alcohol use. Practicing safe sex. Taking low doses of aspirin every day. Taking vitamin and mineral supplements as recommended by your health care provider. What happens during an annual well check? The services and screenings done by your health care provider during your annual well check will depend on your age, overall health, lifestyle risk factors, and family history of disease. Counseling  Your health care provider may ask you questions about your: Alcohol use. Tobacco use. Drug use. Emotional well-being. Home and relationship well-being. Sexual activity. Eating habits. History of falls. Memory and ability to understand (cognition). Work and work Astronomer. Screening  You may have the following tests or measurements: Height, weight, and BMI. Blood pressure. Lipid and cholesterol levels. These may be checked every 5 years, or more frequently if you are over 54 years old. Skin check. Lung cancer screening. You may have this screening every year starting at age 86 if you have a 30-pack-year history of smoking and currently smoke or have quit within the past 15 years. Fecal occult blood test (FOBT) of the stool. You may have this test every year starting at age 15. Flexible sigmoidoscopy or colonoscopy. You may have a sigmoidoscopy every 5 years or a colonoscopy every 10 years starting at age 36. Prostate cancer  screening. Recommendations will vary depending on your family history and other risks. Hepatitis C blood test. Hepatitis B blood test. Sexually transmitted disease (STD) testing. Diabetes screening. This is done by checking your blood sugar (glucose) after you have not eaten for a while (fasting). You may have this done every 1-3 years. Abdominal aortic aneurysm (AAA) screening. You may need this if you are a current or former smoker. Osteoporosis. You may be screened starting at age 41 if you are at high risk. Talk with your health care provider about your test results, treatment options, and if necessary, the need for more tests. Vaccines  Your health care provider may recommend certain vaccines, such as: Influenza vaccine. This is recommended every year. Tetanus, diphtheria, and acellular pertussis (Tdap, Td) vaccine. You may need a Td booster every 10 years. Zoster vaccine. You may need this after age 81. Pneumococcal 13-valent conjugate (PCV13) vaccine. One dose is recommended after age 100. Pneumococcal polysaccharide (PPSV23) vaccine. One dose is recommended after age 34. Talk to your health care provider about which screenings and vaccines you need and how often you need them. This information is not intended to replace advice given to you by your health  care provider. Make sure you discuss any questions you have with your health care provider. Document Released: 04/12/2015 Document Revised: 12/04/2015 Document Reviewed: 01/15/2015 Elsevier Interactive Patient Education  2017 [employer] Prevention in the Home Falls can cause injuries. They can happen to people of all ages. There are many things you can do to make your home safe and to help prevent falls. What can I do on the outside of my home? Regularly fix the edges of walkways and driveways and fix any cracks. Remove anything that might make you trip as you walk through a door, such as a raised step or threshold. Trim any  bushes or trees on the path to your home. Use bright outdoor lighting. Clear any walking paths of anything that might make someone trip, such as rocks or tools. Regularly check to see if handrails are loose or broken. Make sure that both sides of any steps have handrails. Any raised decks and porches should have guardrails on the edges. Have any leaves, snow, or ice cleared regularly. Use sand or salt on walking paths during winter. Clean up any spills in your garage right away. This includes oil or grease spills. What can I do in the bathroom? Use night lights. Install grab bars by the toilet and in the tub and shower. Do not use towel bars as grab bars. Use non-skid mats or decals in the tub or shower. If you need to sit down in the shower, use a plastic, non-slip stool. Keep the floor dry. Clean up any water that spills on the floor as soon as it happens. Remove soap buildup in the tub or shower regularly. Attach bath mats securely with double-sided non-slip rug tape. Do not have throw rugs and other things on the floor that can make you trip. What can I do in the bedroom? Use night lights. Make sure that you have a light by your bed that is easy to reach. Do not use any sheets or blankets that are too big for your bed. They should not hang down onto the floor. Have a firm chair that has side arms. You can use this for support while you get dressed. Do not have throw rugs and other things on the floor that can make you trip. What can I do in the kitchen? Clean up any spills right away. Avoid walking on wet floors. Keep items that you use a lot in easy-to-reach places. If you need to reach something above you, use a strong step stool that has a grab bar. Keep electrical cords out of the way. Do not use floor polish or wax that makes floors slippery. If you must use wax, use non-skid floor wax. Do not have throw rugs and other things on the floor that can make you trip. What can I do  with my stairs? Do not leave any items on the stairs. Make sure that there are handrails on both sides of the stairs and use them. Fix handrails that are broken or loose. Make sure that handrails are as long as the stairways. Check any carpeting to make sure that it is firmly attached to the stairs. Fix any carpet that is loose or worn. Avoid having throw rugs at the top or bottom of the stairs. If you do have throw rugs, attach them to the floor with carpet tape. Make sure that you have a light switch at the top of the stairs and the bottom of the stairs. If you do not  have them, ask someone to add them for you. What else can I do to help prevent falls? Wear shoes that: Do not have high heels. Have rubber bottoms. Are comfortable and fit you well. Are closed at the toe. Do not wear sandals. If you use a stepladder: Make sure that it is fully opened. Do not climb a closed stepladder. Make sure that both sides of the stepladder are locked into place. Ask someone to hold it for you, if possible. Clearly mark and make sure that you can see: Any grab bars or handrails. First and last steps. Where the edge of each step is. Use tools that help you move around (mobility aids) if they are needed. These include: Canes. Walkers. Scooters. Crutches. Turn on the lights when you go into a dark area. Replace any light bulbs as soon as they burn out. Set up your furniture so you have a clear path. Avoid moving your furniture around. If any of your floors are uneven, fix them. If there are any pets around you, be aware of where they are. Review your medicines with your doctor. Some medicines can make you feel dizzy. This can increase your chance of falling. Ask your doctor what other things that you can do to help prevent falls. This information is not intended to replace advice given to you by your health care provider. Make sure you discuss any questions you have with your health care  provider. Document Released: 01/10/2009 Document Revised: 08/22/2015 Document Reviewed: 04/20/2014 Elsevier Interactive Patient Education  2017 [employer].

## 2022-07-29 NOTE — Telephone Encounter (Signed)
Denied today We cover this drug when our criteria are met. The unmet criteria are: has tried or cannot use preferred insulin degludec: brand [place]. This decision was from [employer], museum/gallery.

## 2022-07-30 ENCOUNTER — Telehealth: Payer: Self-pay

## 2022-07-30 DIAGNOSIS — Z1211 Encounter for screening for malignant neoplasm of colon: Secondary | ICD-10-CM

## 2022-07-30 MED ORDER — TRESIBA FLEXTOUCH 200 UNIT/ML ~~LOC~~ SOPN: 48.0000 [IU] | PEN_INJECTOR | Freq: Every day | SUBCUTANEOUS | 5 refills | Status: DC

## 2022-07-30 NOTE — Telephone Encounter (Signed)
Please let patient know that insurance will no onger cover lantus. Had to change to tresiba - continue 48 u a day.  Meds ordered this encounter  Medications   insulin degludec (TRESIBA FLEXTOUCH) 200 UNIT/ML FlexTouch Pen    Sig: Inject 48 Units into the skin daily.    Dispense:  9 mL    Refill:  5    Order Specific Question:   Supervising Provider    Answer:   Nils Pyle [[id number]]   Mary-Margaret Daphine Deutscher, FNP

## 2022-07-30 NOTE — Telephone Encounter (Signed)
Patient calling to let you know he had an AWV yesterday and was told it is time for him to have another Cologuard, anytime after 08/16/22.  He would like you to place an order for this.

## 2022-07-30 NOTE — Telephone Encounter (Signed)
Patient aware.

## 2022-08-05 DIAGNOSIS — L820 Inflamed seborrheic keratosis: Secondary | ICD-10-CM | POA: Diagnosis not present

## 2022-08-05 DIAGNOSIS — Z1283 Encounter for screening for malignant neoplasm of skin: Secondary | ICD-10-CM | POA: Diagnosis not present

## 2022-08-05 DIAGNOSIS — M65322 Trigger finger, left index finger: Secondary | ICD-10-CM | POA: Diagnosis not present

## 2022-08-05 DIAGNOSIS — D485 Neoplasm of uncertain behavior of skin: Secondary | ICD-10-CM | POA: Diagnosis not present

## 2022-08-06 ENCOUNTER — Telehealth: Payer: Self-pay | Admitting: Cardiology

## 2022-08-06 NOTE — Telephone Encounter (Signed)
Reports carvedilol 12.5 mg directions changed to 1 by mouth three times daily and was refilled by PCP. Reports that he has been taking carvedilol 18.75 mg twice daily. Advised that directions from Dr. Diona Browner should be for carvedilol 12.5 mg taking 1&1/2 (18.75 mg)  by mouth twice daily. Says he will continue carvedilol 18.75 mg twice daily.

## 2022-08-06 NOTE — Telephone Encounter (Signed)
Please give pt a call concerning his Carvedilol  [phone]

## 2022-08-12 ENCOUNTER — Telehealth: Payer: Self-pay | Admitting: Nurse Practitioner

## 2022-08-12 DIAGNOSIS — I10 Essential (primary) hypertension: Secondary | ICD-10-CM

## 2022-08-12 DIAGNOSIS — I2510 Atherosclerotic heart disease of native coronary artery without angina pectoris: Secondary | ICD-10-CM

## 2022-08-12 MED ORDER — LISINOPRIL 2.5 MG PO TABS
2.5000 mg | ORAL_TABLET | Freq: Every day | ORAL | 0 refills | Status: DC
Start: 2022-08-12 — End: 2022-08-17

## 2022-08-12 MED ORDER — ISOSORBIDE MONONITRATE ER 30 MG PO TB24
15.0000 mg | ORAL_TABLET | Freq: Every evening | ORAL | 0 refills | Status: DC
Start: 2022-08-12 — End: 2022-08-17

## 2022-08-12 NOTE — Telephone Encounter (Signed)
Aware refills sent to pharmacy 

## 2022-08-12 NOTE — Telephone Encounter (Signed)
  Prescription Request  08/12/2022  Is this a "Controlled Substance" medicine?   Have you seen your PCP in the last 2 weeks? LOV 2/19 NOV 5/20  If YES, route message to pool  -  If NO, patient needs to be scheduled for appointment.  What is the name of the medication or equipment? isosorbide mononitrate (IMDUR) 30 MG 24 hr tablet lisinopril (ZESTRIL) 2.5 MG tablet  Have you contacted your pharmacy to request a refill? Yes, switching pharmacy    Which pharmacy would you like this sent to?  [facility] [address] [place] - [address]     Patient notified that their request is being sent to the clinical staff for review and that they should receive a response within 2 business days.

## 2022-08-17 ENCOUNTER — Encounter: Payer: Self-pay | Admitting: Nurse Practitioner

## 2022-08-17 ENCOUNTER — Ambulatory Visit: Payer: Medicare PPO | Admitting: Nurse Practitioner

## 2022-08-17 VITALS — BP 146/69 | HR 96 | Temp 97.9°F | Resp 20 | Ht 69.0 in | Wt 242.0 lb

## 2022-08-17 DIAGNOSIS — R3911 Hesitancy of micturition: Secondary | ICD-10-CM

## 2022-08-17 DIAGNOSIS — Z6836 Body mass index (BMI) 36.0-36.9, adult: Secondary | ICD-10-CM

## 2022-08-17 DIAGNOSIS — Z794 Long term (current) use of insulin: Secondary | ICD-10-CM

## 2022-08-17 DIAGNOSIS — I25110 Atherosclerotic heart disease of native coronary artery with unstable angina pectoris: Secondary | ICD-10-CM | POA: Diagnosis not present

## 2022-08-17 DIAGNOSIS — N401 Enlarged prostate with lower urinary tract symptoms: Secondary | ICD-10-CM

## 2022-08-17 DIAGNOSIS — E785 Hyperlipidemia, unspecified: Secondary | ICD-10-CM

## 2022-08-17 DIAGNOSIS — E1169 Type 2 diabetes mellitus with other specified complication: Secondary | ICD-10-CM

## 2022-08-17 DIAGNOSIS — I10 Essential (primary) hypertension: Secondary | ICD-10-CM | POA: Diagnosis not present

## 2022-08-17 DIAGNOSIS — K219 Gastro-esophageal reflux disease without esophagitis: Secondary | ICD-10-CM

## 2022-08-17 DIAGNOSIS — I200 Unstable angina: Secondary | ICD-10-CM

## 2022-08-17 DIAGNOSIS — E118 Type 2 diabetes mellitus with unspecified complications: Secondary | ICD-10-CM | POA: Diagnosis not present

## 2022-08-17 MED ORDER — INSULIN ASPART FLEXPEN 100 UNIT/ML ~~LOC~~ SOPN
PEN_INJECTOR | SUBCUTANEOUS | 2 refills | Status: DC
Start: 2022-08-17 — End: 2022-08-19

## 2022-08-17 MED ORDER — FUROSEMIDE 20 MG PO TABS: ORAL_TABLET | ORAL | 1 refills | Status: DC

## 2022-08-17 MED ORDER — TRESIBA FLEXTOUCH 200 UNIT/ML ~~LOC~~ SOPN
48.0000 [IU] | PEN_INJECTOR | Freq: Every day | SUBCUTANEOUS | 5 refills | Status: DC
Start: 2022-08-17 — End: 2022-08-17

## 2022-08-17 MED ORDER — TAMSULOSIN HCL 0.4 MG PO CAPS: 0.4000 mg | ORAL_CAPSULE | Freq: Every day | ORAL | 1 refills | Status: DC

## 2022-08-17 MED ORDER — ISOSORBIDE MONONITRATE ER 30 MG PO TB24: 15.0000 mg | ORAL_TABLET | Freq: Every evening | ORAL | 1 refills | Status: DC

## 2022-08-17 MED ORDER — LISINOPRIL 2.5 MG PO TABS
2.5000 mg | ORAL_TABLET | Freq: Every day | ORAL | 1 refills | Status: DC
Start: 2022-08-17 — End: 2022-11-20

## 2022-08-17 MED ORDER — PANTOPRAZOLE SODIUM 40 MG PO TBEC: 40.0000 mg | DELAYED_RELEASE_TABLET | Freq: Every day | ORAL | 1 refills | Status: DC | PRN

## 2022-08-17 MED ORDER — ATORVASTATIN CALCIUM 80 MG PO TABS
80.0000 mg | ORAL_TABLET | Freq: Every day | ORAL | 1 refills | Status: DC
Start: 2022-08-17 — End: 2022-11-20

## 2022-08-17 MED ORDER — TRESIBA FLEXTOUCH 200 UNIT/ML ~~LOC~~ SOPN
48.0000 [IU] | PEN_INJECTOR | Freq: Two times a day (BID) | SUBCUTANEOUS | 5 refills | Status: DC
Start: 2022-08-17 — End: 2022-10-05

## 2022-08-17 MED ORDER — CARVEDILOL 12.5 MG PO TABS: 12.5000 mg | ORAL_TABLET | Freq: Three times a day (TID) | ORAL | 1 refills | Status: DC

## 2022-08-17 MED ORDER — METFORMIN HCL 500 MG PO TABS: 1000.0000 mg | ORAL_TABLET | Freq: Two times a day (BID) | ORAL | 1 refills | Status: DC

## 2022-08-17 NOTE — Addendum Note (Signed)
Addended by: Bennie Pierini on: 08/17/2022 03:23 PM   Modules accepted: Orders

## 2022-08-17 NOTE — Patient Instructions (Signed)

## 2022-08-17 NOTE — Progress Notes (Signed)
Subjective:    Patient ID: [name] [name], male    DOB: [birth year], 67 y.o.   MRN: [id number]   Chief Complaint: Medical Management of Chronic Issues    HPI:  [name] [name] is a 67 y.o. who identifies as a male who was assigned male at birth.   Social history: Lives with: wife Work history: retired   Water engineer in today for follow up of the following chronic medical issues:  1. Essential hypertension, benign No c/o chest pain, sob or headache. Does occasionally check blood pressure at home. BP Readings from Last 3 Encounters:  08/17/22 (!) 146/69  05/18/22 129/72  04/13/22 126/72     2. Type 2 diabetes mellitus with complication, with long-term current use of insulin (HCC) Fasting blood sugars are running around 120-150. No low blood sugars.  Lab Results  Component Value Date   HGBA1C 8.6 (H) 05/18/2022     3. Hyperlipidemia associated with type 2 diabetes mellitus (HCC) Does try to watch diet but does no do much exercise. Lab Results  Component Value Date   CHOL 90 (L) 05/18/2022   HDL 22 (L) 05/18/2022   LDLCALC 39 05/18/2022   TRIG 175 (H) 05/18/2022   CHOLHDL 4.1 05/18/2022     4. Accelerating angina (HCC) 5. Coronary artery disease involving native coronary artery of native heart with unstable angina pectoris [facility]) Last saw cardiology on 04/13/22. Review of office  note showed no change in plan of care.  6. Gastroesophageal reflux disease without esophagitis Takes protonix daily or he will have reflux symptoms.  7. Benign prostatic hyperplasia with urinary hesitancy No voiding issues. Is on flomax. Has had some frequent UTI lately. Sees urology soon. Lab Results  Component Value Date   PSA1 2.7 05/18/2022   PSA1 10.0 (H) 05/09/2021   PSA1 2.4 08/05/2020      8. BMI 36.0-36.9,adult No recent weight changes Wt Readings from Last 3 Encounters:  08/17/22 242 lb (109.8 kg)  07/29/22 240 lb (108.9 kg)  05/18/22 244 lb (110.7 kg)   BMI Readings from  Last 3 Encounters:  08/17/22 35.74 kg/m  07/29/22 35.44 kg/m  05/18/22 37.10 kg/m      New complaints: None today  No Known Allergies Outpatient Encounter Medications as of 08/17/2022  Medication Sig   Accu-Chek Softclix Lancets lancets CHECK BLOOD SUGAR UP TO 4 TIMES A DAY.Dx E11.8   aspirin EC 81 MG tablet Take 1 tablet (81 mg total) by mouth daily. Swallow whole.   atorvastatin (LIPITOR) 80 MG tablet Take 1 tablet (80 mg total) by mouth daily.   blood glucose meter kit and supplies Dispense based on patient and insurance preference. Use up to four times daily as directed. (FOR ICD-10 E10.9, E11.9).   carvedilol (COREG) 12.5 MG tablet TAKE 1 TABLET BY MOUTH 3 TIMES DAILY.   CINNAMON PO Take 1 tablet by mouth daily.   CRANBERRY PO Take 1 tablet by mouth 2 (two) times daily.   furosemide (LASIX) 20 MG tablet Take 20 mg daily as needed for leg swelling   glucose blood (ACCU-CHEK AVIVA PLUS) test strip CHECK BLOOD SUGAR UP TO 4 TIMES A DAY.Dx E11.8   Insulin Aspart FlexPen (NOVOLOG) 100 UNIT/ML INJECT PER SLIDING SCALE 40-75 UNITS TWICE DAILY   Insulin Pen Needle (B-D ULTRAFINE III SHORT PEN) 31G X 8 MM MISC USE AS DIRECTED UP TO 8 TIMES A DAY AS NEEDED FOR INSULIN   Insulin Pen Needle 32G X 4 MM MISC 1 each  by Does not apply route daily.   isosorbide mononitrate (IMDUR) 30 MG 24 hr tablet Take 0.5 tablets (15 mg total) by mouth every evening.   lisinopril (ZESTRIL) 2.5 MG tablet Take 1 tablet (2.5 mg total) by mouth daily.   metFORMIN (GLUCOPHAGE) 500 MG tablet TAKE 2 TABLETS BY MOUTH TWICE DAILY.   nitroGLYCERIN (NITROSTAT) 0.4 MG SL tablet Place 1 tablet (0.4 mg total) under the tongue every 5 (five) minutes x 3 doses as needed for chest pain.   pantoprazole (PROTONIX) 40 MG tablet Take 1 tablet (40 mg total) by mouth daily as needed (for acid reflux).   Semaglutide, 2 MG/DOSE, 8 MG/3ML SOPN Inject 2 mg as directed once a week.   tamsulosin (FLOMAX) 0.4 MG CAPS capsule TAKE 1  CAPSULE BY MOUTH AT BEDTIME.   insulin degludec (TRESIBA FLEXTOUCH) 200 UNIT/ML FlexTouch Pen Inject 48 Units into the skin daily. (Patient not taking: Reported on 08/17/2022)   [DISCONTINUED] cetirizine (ZYRTEC) 10 MG tablet Take 10 mg by mouth daily.   No facility-administered encounter medications on file as of 08/17/2022.    Past Surgical History:  Procedure Laterality Date   CARDIAC CATHETERIZATION  2012   CAROTID STENT     stents x 2    CHOLECYSTECTOMY N/A 12/04/2015   Procedure: LAPAROSCOPIC CHOLECYSTECTOMY;  Surgeon: Franky Macho, MD;  Location: [facility];  Service: General;  Laterality: N/A;   CHOLECYSTECTOMY, LAPAROSCOPIC  12/05/2015   CORONARY ANGIOPLASTY  2012   STENT X 1 2012, STENT X 1 YRS BEFORE   CORONARY ARTERY BYPASS GRAFT N/A 08/21/2020   Procedure: CORONARY ARTERY BYPASS GRAFTING (CABG) X 4, USING LEFT INTERNAL MAMMARY ARTERY AND RIGHT GREATER SAPHENOUS VEIN HARVESTED ENDOSCOPICALLY. SVG TO OM1, OM3 SEQUENTIALLY, SVG TO DIAG., LIMA TO LAD;  Surgeon: Loreli Slot, MD;  Location: [facility] OR;  Service: Open Heart Surgery;  Laterality: N/A;   CORONARY ATHERECTOMY N/A 02/09/2020   Procedure: CORONARY ATHERECTOMY;  Surgeon: Iran Ouch, MD;  Location: [facility] INVASIVE CV LAB;  Service: Cardiovascular;  Laterality: N/A;   CORONARY IMAGING/OCT N/A 05/22/2020   Procedure: INTRAVASCULAR IMAGING/OCT;  Surgeon: Corky Crafts, MD;  Location: [facility] INVASIVE CV LAB;  Service: Cardiovascular;  Laterality: N/A;   CORONARY STENT INTERVENTION N/A 02/09/2020   Procedure: CORONARY STENT INTERVENTION;  Surgeon: Iran Ouch, MD;  Location: [facility] INVASIVE CV LAB;  Service: Cardiovascular;  Laterality: N/A;   CORONARY STENT INTERVENTION N/A 05/22/2020   Procedure: CORONARY STENT INTERVENTION;  Surgeon: Corky Crafts, MD;  Location: [facility] INVASIVE CV LAB;  Service: Cardiovascular;  Laterality: N/A;   CORONARY ULTRASOUND/IVUS N/A 02/09/2020   Procedure: Intravascular Ultrasound/IVUS;   Surgeon: Iran Ouch, MD;  Location: [facility] INVASIVE CV LAB;  Service: Cardiovascular;  Laterality: N/A;   ENDOVEIN HARVEST OF GREATER SAPHENOUS VEIN Right 08/21/2020   Procedure: ENDOVEIN HARVEST OF GREATER SAPHENOUS VEIN;  Surgeon: Loreli Slot, MD;  Location: [facility] OR;  Service: Open Heart Surgery;  Laterality: Right;   HYDROCELE EXCISION Bilateral 06/02/2017   Procedure: HYDROCELECTOMY ADULT;  Surgeon: Rene Paci, MD;  Location: [facility];  Service: Urology;  Laterality: Bilateral;  ONLY NEEDS 45 MIN   INGUINAL HERNIA REPAIR     RIGHT GROIN   KNEE ARTHROSCOPY Left 05/23/2019   Procedure: LEFT KNEE ARTHROSCOPY AND DEBRIDEMENT PARTIAL MEDIAL MENISECTOMY;  Surgeon: Nadara Mustard, MD;  Location: [facility];  Service: Orthopedics;  Laterality: Left;   LEFT HEART CATH AND CORONARY ANGIOGRAPHY N/A 02/07/2020   Procedure: LEFT  HEART CATH AND CORONARY ANGIOGRAPHY;  Surgeon: Marykay Lex, MD;  Location: [facility] INVASIVE CV LAB;  Service: Cardiovascular;  Laterality: N/A;   LEFT HEART CATH AND CORONARY ANGIOGRAPHY N/A 05/22/2020   Procedure: LEFT HEART CATH AND CORONARY ANGIOGRAPHY;  Surgeon: Corky Crafts, MD;  Location: [facility] INVASIVE CV LAB;  Service: Cardiovascular;  Laterality: N/A;   LEFT HEART CATH AND CORONARY ANGIOGRAPHY N/A 08/15/2020   Procedure: LEFT HEART CATH AND CORONARY ANGIOGRAPHY;  Surgeon: Runell Gess, MD;  Location: [facility] INVASIVE CV LAB;  Service: Cardiovascular;  Laterality: N/A;   TEE WITHOUT CARDIOVERSION N/A 08/21/2020   Procedure: TRANSESOPHAGEAL ECHOCARDIOGRAM (TEE);  Surgeon: Loreli Slot, MD;  Location: [facility] OR;  Service: Open Heart Surgery;  Laterality: N/A;   TRIGGER FINGER RELEASE Right 01/08/2015   Procedure: RELEASE TRIGGER FINGER/A-1 PULLEY RIGHT MIDDLE FINGER;  Surgeon: Cindee Salt, MD;  Location: [facility];  Service: Orthopedics;  Laterality: Right;    Family History  Problem Relation Age of Onset    Hyperlipidemia Mother    Hypertension Mother    Hyperlipidemia Father    Hypertension Father    Diabetes Father    Dementia Father    Diabetes Maternal Grandfather       Controlled substance contract: n/a     Review of Systems  Constitutional:  Negative for diaphoresis.  Eyes:  Negative for pain.  Respiratory:  Negative for shortness of breath.   Cardiovascular:  Negative for chest pain, palpitations and leg swelling.  Gastrointestinal:  Negative for abdominal pain.  Endocrine: Negative for polydipsia.  Skin:  Negative for rash.  Neurological:  Negative for dizziness, weakness and headaches.  Hematological:  Does not bruise/bleed easily.  All other systems reviewed and are negative.      Objective:   Physical Exam Vitals and nursing note reviewed.  Constitutional:      Appearance: Normal appearance. He is well-developed.  HENT:     Head: Normocephalic.     Nose: Nose normal.     Mouth/Throat:     Mouth: Mucous membranes are moist.     Pharynx: Oropharynx is clear.  Eyes:     Pupils: Pupils are equal, round, and reactive to light.  Neck:     Thyroid: No thyroid mass or thyromegaly.     Vascular: No carotid bruit or JVD.     Trachea: Phonation normal.  Cardiovascular:     Rate and Rhythm: Normal rate and regular rhythm.  Pulmonary:     Effort: Pulmonary effort is normal. No respiratory distress.     Breath sounds: Normal breath sounds.  Abdominal:     General: Bowel sounds are normal.     Palpations: Abdomen is soft.     Tenderness: There is no abdominal tenderness.  Musculoskeletal:        General: Normal range of motion.     Cervical back: Normal range of motion and neck supple.  Lymphadenopathy:     Cervical: No cervical adenopathy.  Skin:    General: Skin is warm and dry.  Neurological:     Mental Status: He is alert and oriented to person, place, and time.  Psychiatric:        Behavior: Behavior normal.        Thought Content: Thought content  normal.        Judgment: Judgment normal.    BP (!) 146/69   Pulse 96   Temp 97.9 F (36.6 C) (Temporal)   Resp 20  Ht 5\' 9"  (1.753 m)   Wt 242 lb (109.8 kg)   SpO2 96%   BMI 35.74 kg/m   Hgba1c 7.7%      Assessment & Plan:   [name] [name] comes in today with chief complaint of Medical Management of Chronic Issues   Diagnosis and orders addressed:  1. Essential hypertension, benign Low sodium diet - CBC with Differential/Platelet - CMP14+EGFR - lisinopril (ZESTRIL) 2.5 MG tablet; Take 1 tablet (2.5 mg total) by mouth daily.  Dispense: 90 tablet; Refill: 1  2. Type 2 diabetes mellitus with complication, with long-term current use of insulin (HCC) Continue to watch carbs in diet - Bayer DCA Hb A1c Waived - Insulin Aspart FlexPen (NOVOLOG) 100 UNIT/ML; INJECT PER SLIDING SCALE 40-75 UNITS TWICE DAILY  Dispense: 9 mL; Refill: 2 - metFORMIN (GLUCOPHAGE) 500 MG tablet; Take 2 tablets (1,000 mg total) by mouth 2 (two) times daily.  Dispense: 120 tablet; Refill: 1 - insulin degludec (TRESIBA FLEXTOUCH) 200 UNIT/ML FlexTouch Pen; Inject 48 Units into the skin daily.  Dispense: 9 mL; Refill: 5  3. Hyperlipidemia associated with type 2 diabetes mellitus (HCC) Low fat diet - Lipid panel - atorvastatin (LIPITOR) 80 MG tablet; Take 1 tablet (80 mg total) by mouth daily.  Dispense: 90 tablet; Refill: 1  4. Accelerating angina [facility]) Report any issues  5. Coronary artery disease involving native coronary artery of native heart with unstable angina pectoris [facility]) Keep follow up with cardiology  6. Gastroesophageal reflux disease without esophagitis Avoid spicy foods Do not eat 2 hours prior to bedtime - pantoprazole (PROTONIX) 40 MG tablet; Take 1 tablet (40 mg total) by mouth daily as needed (for acid reflux).  Dispense: 90 tablet; Refill: 1  7. Benign prostatic hyperplasia with urinary hesitancy Report any voiding issues - tamsulosin (FLOMAX) 0.4 MG CAPS capsule; Take 1  capsule (0.4 mg total) by mouth at bedtime.  Dispense: 90 capsule; Refill: 1  8. BMI 36.0-36.9,adult Discussed diet and exercise for person with BMI >25 Will recheck weight in 3-6 months    Labs pending Health Maintenance reviewed Diet and exercise encouraged  Follow up plan: 3 months   Mary-Margaret Daphine Deutscher, FNP

## 2022-08-18 LAB — BAYER DCA HB A1C WAIVED: HB A1C (BAYER DCA - WAIVED): 7.7 % — ABNORMAL HIGH (ref 4.8–5.6)

## 2022-08-19 ENCOUNTER — Other Ambulatory Visit: Payer: Self-pay | Admitting: Nurse Practitioner

## 2022-08-19 DIAGNOSIS — Z794 Long term (current) use of insulin: Secondary | ICD-10-CM

## 2022-08-19 MED ORDER — INSULIN ASPART FLEXPEN 100 UNIT/ML ~~LOC~~ SOPN
PEN_INJECTOR | SUBCUTANEOUS | 2 refills | Status: DC
Start: 2022-08-19 — End: 2022-10-12

## 2022-08-25 ENCOUNTER — Telehealth: Payer: Self-pay | Admitting: Nurse Practitioner

## 2022-08-25 MED ORDER — ACCU-CHEK SOFTCLIX LANCETS MISC: 12 refills | Status: DC

## 2022-08-25 MED ORDER — ACCU-CHEK AVIVA PLUS VI STRP: ORAL_STRIP | 3 refills | Status: DC

## 2022-08-25 NOTE — Telephone Encounter (Signed)
Refills sent and pt is aware.

## 2022-08-25 NOTE — Telephone Encounter (Signed)
  Prescription Request  08/25/2022  Is this a "Controlled Substance" medicine? no  Have you seen your PCP in the last 2 weeks? No next appt is on 11/20/22  If YES, route message to pool  -  If NO, patient needs to be scheduled for appointment.  What is the name of the medication or equipment? glucose blood (ACCU-CHEK AVIVA PLUS) test strip  Accu-Chek Softclix Lancets lancets  Have you contacted your pharmacy to request a refill? no   Which pharmacy would you like this sent to? [facility]   Patient notified that their request is being sent to the clinical staff for review and that they should receive a response within 2 business days.

## 2022-09-03 DIAGNOSIS — Z1211 Encounter for screening for malignant neoplasm of colon: Secondary | ICD-10-CM | POA: Diagnosis not present

## 2022-09-09 LAB — COLOGUARD: COLOGUARD: NEGATIVE

## 2022-09-16 ENCOUNTER — Telehealth: Payer: Self-pay | Admitting: Cardiology

## 2022-09-16 MED ORDER — NITROGLYCERIN 0.4 MG SL SUBL: 0.4000 mg | SUBLINGUAL_TABLET | SUBLINGUAL | 3 refills | Status: DC | PRN

## 2022-09-16 NOTE — Telephone Encounter (Signed)
He needs a refill on his nitroglycerin. Sent to [facility] here in [place].  Ty

## 2022-09-16 NOTE — Telephone Encounter (Signed)
Completed.

## 2022-09-27 NOTE — Progress Notes (Unsigned)
    Cardiology Office Note  Date: 09/28/2022   ID: Kayeden, Vandersluis 1956-01-07, MRN [id number]  History of Present Illness: KINGSLY SCURRY is a 67 y.o. male last seen in January.  He is here for a routine visit.  States that he has retired completely at this point.  Does not indicate any angina or nitroglycerin use, still intermittently short of breath with activity, worse with the heat and humidity.  He has had some lower leg skin changes, left greater than right, although no sores or claudication.  Vascular studies around the time of CABG were reassuring.  I reviewed his medications.  He continues on Lipitor 80 mg daily with last LDL 39 in February.  Blood pressure is reasonably controlled today.  Physical Exam: VS:  BP 128/82   Pulse 80   Ht 5' 8.5" (1.74 m)   Wt 242 lb (109.8 kg)   SpO2 98%   BMI 36.26 kg/m , BMI Body mass index is 36.26 kg/m.  Wt Readings from Last 3 Encounters:  09/28/22 242 lb (109.8 kg)  08/17/22 242 lb (109.8 kg)  07/29/22 240 lb (108.9 kg)    General: Patient appears comfortable at rest. HEENT: Conjunctiva and lids normal. Neck: Supple, no elevated JVP or carotid bruits. Lungs: Clear to auscultation, nonlabored breathing at rest. Cardiac: Regular rate and rhythm, no S3 or significant systolic murmur. Extremities: Mild ankle edema, DPs 1-2+..  ECG:  An ECG dated 04/13/2022 was personally reviewed today and demonstrated:  Sinus rhythm with nonspecific ST-T changes.  Labwork: 05/18/2022: ALT 29; AST 21; BUN 21; Creatinine, Ser 1.03; Hemoglobin 13.2; Platelets 283; Potassium 4.8; Sodium 140     Component Value Date/Time   CHOL 90 (L) 05/18/2022 1356   TRIG 175 (H) 05/18/2022 1356   HDL 22 (L) 05/18/2022 1356   CHOLHDL 4.1 05/18/2022 1356   CHOLHDL 3.7 08/16/2020 0319   VLDL 26 08/16/2020 0319   LDLCALC 39 05/18/2022 1356   Other Studies Reviewed Today:  No interval cardiac testing for review today.  Assessment and Plan:  1.  Multivessel CAD  status post DES to the distal circumflex in 2005, DES to the LAD/diagonal bifurcation in 2012, DES to the ostial LAD and DES to the posterolateral in 2021, and ultimately CABG in May 2022 with LIMA to LAD, SVG to diagonal, and SVG to OM1 and OM 3.  LVEF 60 to 65% by echocardiogram in May 2022.  He is symptomatically stable at this time on medical therapy.  Continue aspirin, Coreg, Imdur, lisinopril, Lipitor, and as needed nitroglycerin.  We discussed a walking plan for exercise.  2.  Essential hypertension.  Blood pressure is reasonably controlled today, no changes were made.  3.  Mixed hyperlipidemia.  LDL 39 in February.  Continue Lipitor 80 mg daily which she is tolerating.  4.  Type 2 diabetes mellitus.  Currently on Tresiba, NovoLog, and metformin with follow-up by PCP.  Last A1c was down to 7.7 in May.  Disposition:  Follow up  6 months.  Signed, Jonelle Sidle, M.D., F.A.C.C. [facility]

## 2022-09-28 ENCOUNTER — Encounter: Payer: Self-pay | Admitting: Cardiology

## 2022-09-28 ENCOUNTER — Ambulatory Visit: Payer: Medicare PPO | Attending: Cardiology | Admitting: Cardiology

## 2022-09-28 VITALS — BP 128/82 | HR 80 | Ht 68.5 in | Wt 242.0 lb

## 2022-09-28 DIAGNOSIS — E782 Mixed hyperlipidemia: Secondary | ICD-10-CM

## 2022-09-28 DIAGNOSIS — I10 Essential (primary) hypertension: Secondary | ICD-10-CM | POA: Diagnosis not present

## 2022-09-28 DIAGNOSIS — I25119 Atherosclerotic heart disease of native coronary artery with unspecified angina pectoris: Secondary | ICD-10-CM

## 2022-09-28 NOTE — Patient Instructions (Addendum)

## 2022-10-05 ENCOUNTER — Other Ambulatory Visit: Payer: Self-pay | Admitting: Nurse Practitioner

## 2022-10-05 DIAGNOSIS — Z794 Long term (current) use of insulin: Secondary | ICD-10-CM

## 2022-10-05 MED ORDER — TRESIBA FLEXTOUCH 200 UNIT/ML ~~LOC~~ SOPN
56.0000 [IU] | PEN_INJECTOR | Freq: Two times a day (BID) | SUBCUTANEOUS | 5 refills | Status: DC
Start: 2022-10-05 — End: 2022-11-20

## 2022-10-05 MED ORDER — INSULIN GLARGINE 100 UNIT/ML SOLOSTAR PEN
PEN_INJECTOR | SUBCUTANEOUS | 11 refills | Status: DC
Start: 2022-10-05 — End: 2022-11-20

## 2022-10-05 NOTE — Progress Notes (Signed)
Patient has been on tresiba because insurance would not pay for lantus anymore. The tresiba is not working as well. No change in diet. Blood sugars have been elevated and he is having to take mor eon his sliding scale to keep blood sugars down. Wants to go back on lantus.  Mary-Margaret Daphine Deutscher, FNP

## 2022-10-07 ENCOUNTER — Other Ambulatory Visit: Payer: Self-pay | Admitting: Nurse Practitioner

## 2022-10-10 ENCOUNTER — Other Ambulatory Visit: Payer: Self-pay | Admitting: Nurse Practitioner

## 2022-10-10 DIAGNOSIS — E118 Type 2 diabetes mellitus with unspecified complications: Secondary | ICD-10-CM

## 2022-10-12 DIAGNOSIS — N400 Enlarged prostate without lower urinary tract symptoms: Secondary | ICD-10-CM | POA: Diagnosis not present

## 2022-10-14 DIAGNOSIS — D485 Neoplasm of uncertain behavior of skin: Secondary | ICD-10-CM | POA: Diagnosis not present

## 2022-10-14 DIAGNOSIS — L570 Actinic keratosis: Secondary | ICD-10-CM | POA: Diagnosis not present

## 2022-10-14 DIAGNOSIS — Z1283 Encounter for screening for malignant neoplasm of skin: Secondary | ICD-10-CM | POA: Diagnosis not present

## 2022-10-23 ENCOUNTER — Other Ambulatory Visit: Payer: Self-pay | Admitting: Nurse Practitioner

## 2022-10-23 DIAGNOSIS — E118 Type 2 diabetes mellitus with unspecified complications: Secondary | ICD-10-CM

## 2022-10-31 ENCOUNTER — Other Ambulatory Visit: Payer: Self-pay | Admitting: Nurse Practitioner

## 2022-10-31 DIAGNOSIS — Z794 Long term (current) use of insulin: Secondary | ICD-10-CM

## 2022-11-05 ENCOUNTER — Other Ambulatory Visit: Payer: Self-pay | Admitting: Nurse Practitioner

## 2022-11-05 DIAGNOSIS — Z794 Long term (current) use of insulin: Secondary | ICD-10-CM

## 2022-11-20 ENCOUNTER — Ambulatory Visit: Payer: Medicare PPO | Admitting: Nurse Practitioner

## 2022-11-20 ENCOUNTER — Encounter: Payer: Self-pay | Admitting: Nurse Practitioner

## 2022-11-20 VITALS — BP 143/69 | HR 91 | Temp 98.1°F | Resp 20 | Ht 68.0 in | Wt 244.0 lb

## 2022-11-20 DIAGNOSIS — I10 Essential (primary) hypertension: Secondary | ICD-10-CM | POA: Diagnosis not present

## 2022-11-20 DIAGNOSIS — K219 Gastro-esophageal reflux disease without esophagitis: Secondary | ICD-10-CM

## 2022-11-20 DIAGNOSIS — E118 Type 2 diabetes mellitus with unspecified complications: Secondary | ICD-10-CM | POA: Diagnosis not present

## 2022-11-20 DIAGNOSIS — E1169 Type 2 diabetes mellitus with other specified complication: Secondary | ICD-10-CM

## 2022-11-20 DIAGNOSIS — Z794 Long term (current) use of insulin: Secondary | ICD-10-CM

## 2022-11-20 DIAGNOSIS — R3911 Hesitancy of micturition: Secondary | ICD-10-CM

## 2022-11-20 DIAGNOSIS — N401 Enlarged prostate with lower urinary tract symptoms: Secondary | ICD-10-CM | POA: Diagnosis not present

## 2022-11-20 DIAGNOSIS — E785 Hyperlipidemia, unspecified: Secondary | ICD-10-CM | POA: Diagnosis not present

## 2022-11-20 DIAGNOSIS — I25110 Atherosclerotic heart disease of native coronary artery with unstable angina pectoris: Secondary | ICD-10-CM | POA: Diagnosis not present

## 2022-11-20 DIAGNOSIS — Z6836 Body mass index (BMI) 36.0-36.9, adult: Secondary | ICD-10-CM | POA: Diagnosis not present

## 2022-11-20 LAB — BAYER DCA HB A1C WAIVED: HB A1C (BAYER DCA - WAIVED): 7.6 % — ABNORMAL HIGH (ref 4.8–5.6)

## 2022-11-20 MED ORDER — PANTOPRAZOLE SODIUM 40 MG PO TBEC
40.0000 mg | DELAYED_RELEASE_TABLET | Freq: Every day | ORAL | 1 refills | Status: DC | PRN
Start: 2022-11-20 — End: 2023-02-22

## 2022-11-20 MED ORDER — ATORVASTATIN CALCIUM 80 MG PO TABS: 80.0000 mg | ORAL_TABLET | Freq: Every day | ORAL | 1 refills | Status: DC

## 2022-11-20 MED ORDER — NOVOLOG FLEXPEN 100 UNIT/ML ~~LOC~~ SOPN
PEN_INJECTOR | SUBCUTANEOUS | 3 refills | Status: DC
Start: 2022-11-20 — End: 2023-01-04

## 2022-11-20 MED ORDER — TAMSULOSIN HCL 0.4 MG PO CAPS
0.4000 mg | ORAL_CAPSULE | Freq: Every day | ORAL | 1 refills | Status: DC
Start: 2022-11-20 — End: 2023-02-22

## 2022-11-20 MED ORDER — INSULIN GLARGINE 100 UNIT/ML SOLOSTAR PEN
PEN_INJECTOR | SUBCUTANEOUS | 11 refills | Status: DC
Start: 2022-11-20 — End: 2023-02-22

## 2022-11-20 MED ORDER — LISINOPRIL 2.5 MG PO TABS
2.5000 mg | ORAL_TABLET | Freq: Every day | ORAL | 1 refills | Status: DC
Start: 2022-11-20 — End: 2023-02-22

## 2022-11-20 MED ORDER — METFORMIN HCL 500 MG PO TABS
1000.0000 mg | ORAL_TABLET | Freq: Two times a day (BID) | ORAL | 1 refills | Status: DC
Start: 2022-11-20 — End: 2023-01-25

## 2022-11-20 NOTE — Progress Notes (Addendum)
Subjective:    Patient ID: [name] [name], male    DOB: 12/27/1955, 67 y.o.   MRN: [id number]   Chief Complaint: medical management of chronic issues     HPI:  [name] [name] is a 67 y.o. who identifies as a male who was assigned male at birth.   Social history: Lives with: wife Work history: retired Buyer, retail   Comes in today for follow up of the following chronic medical issues:  1. Essential hypertension, benign No c/o chest pain, sob or headache. Does not check blood pressure at home. BP Readings from Last 3 Encounters:  09/28/22 128/82  08/17/22 (!) 146/69  05/18/22 129/72    2. Coronary artery disease involving native coronary artery of native heart with unstable angina pectoris [facility]) Last saw cardiology on 09/28/22. Review of office note showed no change in plan of care. Was encouraged to start a walking program which he has really not done.  3. Hyperlipidemia associated with type 2 diabetes mellitus (HCC) Does try to watch diet. No dedicated exercise very frequently Lab Results  Component Value Date   CHOL 90 (L) 05/18/2022   HDL 22 (L) 05/18/2022   LDLCALC 39 05/18/2022   TRIG 175 (H) 05/18/2022   CHOLHDL 4.1 05/18/2022     4. Type 2 diabetes mellitus with complication, with long-term current use of insulin (HCC) Fasting blood sugars re running around 100-120.no low blood sugars Lab Results  Component Value Date   HGBA1C 7.7 (H) 08/17/2022     5. Gastroesophageal reflux disease without esophagitis Takes protonix daily which works well to keep symptoms under control.  6. Benign prostatic hyperplasia with urinary hesitancy No voiding issues  7. BMI 36.0-36.9,adult No recent weight changes Wt Readings from Last 3 Encounters:  11/20/22 244 lb (110.7 kg)  09/28/22 242 lb (109.8 kg)  08/17/22 242 lb (109.8 kg)   BMI Readings from Last 3 Encounters:  11/20/22 37.10 kg/m  09/28/22 36.26 kg/m  08/17/22 35.74 kg/m      New  complaints: None  today  No Known Allergies Outpatient Encounter Medications as of 11/20/2022  Medication Sig   Accu-Chek Softclix Lancets lancets CHECK BLOOD SUGAR UP TO 4 TIMES A DAY.Dx E11.8   aspirin EC 81 MG tablet Take 1 tablet (81 mg total) by mouth daily. Swallow whole.   atorvastatin (LIPITOR) 80 MG tablet Take 1 tablet (80 mg total) by mouth daily.   blood glucose meter kit and supplies Dispense based on patient and insurance preference. Use up to four times daily as directed. (FOR ICD-10 E10.9, E11.9).   carvedilol (COREG) 12.5 MG tablet Take 1 tablet (12.5 mg total) by mouth 3 (three) times daily.   CINNAMON PO Take 1 tablet by mouth daily.   CRANBERRY PO Take 1 tablet by mouth 2 (two) times daily.   furosemide (LASIX) 20 MG tablet Take 20 mg daily as needed for leg swelling   glucose blood (ACCU-CHEK AVIVA PLUS) test strip CHECK BLOOD SUGAR UP TO 4 TIMES A DAY.Dx E11.8   insulin degludec (TRESIBA FLEXTOUCH) 200 UNIT/ML FlexTouch Pen Inject 56 Units into the skin 2 (two) times daily.   insulin glargine (LANTUS) 100 UNIT/ML Solostar Pen 40u in am and 70 u in evening   Insulin Pen Needle (B-D ULTRAFINE III SHORT PEN) 31G X 8 MM MISC Use up to 8 times daily as needed with insulin Dx E11.8   Insulin Pen Needle 32G X 4 MM MISC 1 each by Does not apply  route daily.   isosorbide mononitrate (IMDUR) 30 MG 24 hr tablet Take 0.5 tablets (15 mg total) by mouth every evening.   lisinopril (ZESTRIL) 2.5 MG tablet Take 1 tablet (2.5 mg total) by mouth daily.   loratadine (CLARITIN) 10 MG tablet Take 10 mg by mouth daily as needed.   meclizine (ANTIVERT) 25 MG tablet Take 25 mg by mouth daily as needed for dizziness.   metFORMIN (GLUCOPHAGE) 500 MG tablet Take 2 tablets by mouth twice daily   nitroGLYCERIN (NITROSTAT) 0.4 MG SL tablet Place 1 tablet (0.4 mg total) under the tongue every 5 (five) minutes x 3 doses as needed for chest pain.   NOVOLOG FLEXPEN 100 UNIT/ML FlexPen INJECT 40 TO 75  UNITS PER SLIDING SCALE SUBCUTANEOUSLY TWICE DAILY   ondansetron (ZOFRAN-ODT) 4 MG disintegrating tablet Take 4 mg by mouth daily as needed for nausea.   pantoprazole (PROTONIX) 40 MG tablet Take 1 tablet (40 mg total) by mouth daily as needed (for acid reflux).   Semaglutide, 2 MG/DOSE, 8 MG/3ML SOPN Inject 2 mg as directed once a week.   tamsulosin (FLOMAX) 0.4 MG CAPS capsule Take 1 capsule (0.4 mg total) by mouth at bedtime.   No facility-administered encounter medications on file as of 11/20/2022.    Past Surgical History:  Procedure Laterality Date   CARDIAC CATHETERIZATION  2012   CAROTID STENT     stents x 2    CHOLECYSTECTOMY N/A 12/04/2015   Procedure: LAPAROSCOPIC CHOLECYSTECTOMY;  Surgeon: Franky Macho, MD;  Location: [facility];  Service: General;  Laterality: N/A;   CHOLECYSTECTOMY, LAPAROSCOPIC  12/05/2015   CORONARY ANGIOPLASTY  2012   STENT X 1 2012, STENT X 1 YRS BEFORE   CORONARY ARTERY BYPASS GRAFT N/A 08/21/2020   Procedure: CORONARY ARTERY BYPASS GRAFTING (CABG) X 4, USING LEFT INTERNAL MAMMARY ARTERY AND RIGHT GREATER SAPHENOUS VEIN HARVESTED ENDOSCOPICALLY. SVG TO OM1, OM3 SEQUENTIALLY, SVG TO DIAG., LIMA TO LAD;  Surgeon: Loreli Slot, MD;  Location: [facility] OR;  Service: Open Heart Surgery;  Laterality: N/A;   CORONARY ATHERECTOMY N/A 02/09/2020   Procedure: CORONARY ATHERECTOMY;  Surgeon: Iran Ouch, MD;  Location: [facility] INVASIVE CV LAB;  Service: Cardiovascular;  Laterality: N/A;   CORONARY IMAGING/OCT N/A 05/22/2020   Procedure: INTRAVASCULAR IMAGING/OCT;  Surgeon: Corky Crafts, MD;  Location: [facility] INVASIVE CV LAB;  Service: Cardiovascular;  Laterality: N/A;   CORONARY STENT INTERVENTION N/A 02/09/2020   Procedure: CORONARY STENT INTERVENTION;  Surgeon: Iran Ouch, MD;  Location: [facility] INVASIVE CV LAB;  Service: Cardiovascular;  Laterality: N/A;   CORONARY STENT INTERVENTION N/A 05/22/2020   Procedure: CORONARY STENT INTERVENTION;  Surgeon: Corky Crafts, MD;  Location: [facility] INVASIVE CV LAB;  Service: Cardiovascular;  Laterality: N/A;   CORONARY ULTRASOUND/IVUS N/A 02/09/2020   Procedure: Intravascular Ultrasound/IVUS;  Surgeon: Iran Ouch, MD;  Location: [facility] INVASIVE CV LAB;  Service: Cardiovascular;  Laterality: N/A;   ENDOVEIN HARVEST OF GREATER SAPHENOUS VEIN Right 08/21/2020   Procedure: ENDOVEIN HARVEST OF GREATER SAPHENOUS VEIN;  Surgeon: Loreli Slot, MD;  Location: [facility];  Service: Open Heart Surgery;  Laterality: Right;   HYDROCELE EXCISION Bilateral 06/02/2017   Procedure: HYDROCELECTOMY ADULT;  Surgeon: Rene Paci, MD;  Location: [facility];  Service: Urology;  Laterality: Bilateral;  ONLY NEEDS 45 MIN   INGUINAL HERNIA REPAIR     RIGHT GROIN   KNEE ARTHROSCOPY Left 05/23/2019   Procedure: LEFT KNEE ARTHROSCOPY AND DEBRIDEMENT PARTIAL MEDIAL MENISECTOMY;  Surgeon:  Nadara Mustard, MD;  Location: [facility];  Service: Orthopedics;  Laterality: Left;   LEFT HEART CATH AND CORONARY ANGIOGRAPHY N/A 02/07/2020   Procedure: LEFT HEART CATH AND CORONARY ANGIOGRAPHY;  Surgeon: Marykay Lex, MD;  Location: [facility] INVASIVE CV LAB;  Service: Cardiovascular;  Laterality: N/A;   LEFT HEART CATH AND CORONARY ANGIOGRAPHY N/A 05/22/2020   Procedure: LEFT HEART CATH AND CORONARY ANGIOGRAPHY;  Surgeon: Corky Crafts, MD;  Location: [facility] INVASIVE CV LAB;  Service: Cardiovascular;  Laterality: N/A;   LEFT HEART CATH AND CORONARY ANGIOGRAPHY N/A 08/15/2020   Procedure: LEFT HEART CATH AND CORONARY ANGIOGRAPHY;  Surgeon: Runell Gess, MD;  Location: [facility] INVASIVE CV LAB;  Service: Cardiovascular;  Laterality: N/A;   TEE WITHOUT CARDIOVERSION N/A 08/21/2020   Procedure: TRANSESOPHAGEAL ECHOCARDIOGRAM (TEE);  Surgeon: Loreli Slot, MD;  Location: [facility] OR;  Service: Open Heart Surgery;  Laterality: N/A;   TRIGGER FINGER RELEASE Right 01/08/2015   Procedure: RELEASE TRIGGER FINGER/A-1 PULLEY RIGHT MIDDLE  FINGER;  Surgeon: Cindee Salt, MD;  Location:  SURGERY CENTER;  Service: Orthopedics;  Laterality: Right;    Family History  Problem Relation Age of Onset   Hyperlipidemia Mother    Hypertension Mother    Hyperlipidemia Father    Hypertension Father    Diabetes Father    Dementia Father    Diabetes Maternal Grandfather       Controlled substance contract: n/a     Review of Systems  Constitutional:  Negative for diaphoresis.  Eyes:  Negative for pain.  Respiratory:  Negative for shortness of breath.   Cardiovascular:  Negative for chest pain, palpitations and leg swelling.  Gastrointestinal:  Negative for abdominal pain.  Endocrine: Negative for polydipsia.  Skin:  Negative for rash.  Neurological:  Negative for dizziness, weakness and headaches.  Hematological:  Does not bruise/bleed easily.  All other systems reviewed and are negative.      Objective:   Physical Exam Vitals and nursing note reviewed.  Constitutional:      Appearance: Normal appearance. He is well-developed.  HENT:     Head: Normocephalic.     Nose: Nose normal.     Mouth/Throat:     Mouth: Mucous membranes are moist.     Pharynx: Oropharynx is clear.  Eyes:     Pupils: Pupils are equal, round, and reactive to light.  Neck:     Thyroid: No thyroid mass or thyromegaly.     Vascular: No carotid bruit or JVD.     Trachea: Phonation normal.  Cardiovascular:     Rate and Rhythm: Normal rate and regular rhythm.  Pulmonary:     Effort: Pulmonary effort is normal. No respiratory distress.     Breath sounds: Normal breath sounds.  Abdominal:     General: Bowel sounds are normal.     Palpations: Abdomen is soft.     Tenderness: There is no abdominal tenderness.     Hernia: A hernia (soft nontender umbilical hernia) is present.  Musculoskeletal:        General: Normal range of motion.     Cervical back: Normal range of motion and neck supple.  Lymphadenopathy:     Cervical: No cervical  adenopathy.  Skin:    General: Skin is warm and dry.  Neurological:     Mental Status: He is alert and oriented to person, place, and time.  Psychiatric:        Behavior: Behavior normal.  Thought Content: Thought content normal.        Judgment: Judgment normal.    BP (!) 143/69   Pulse 91   Temp 98.1 F (36.7 C) (Temporal)   Resp 20   Ht 5\' 8"  (1.727 m)   Wt 244 lb (110.7 kg)   SpO2 98%   BMI 37.10 kg/m         Assessment & Plan:   [name] [name] comes in today with chief complaint of Medical Management of Chronic Issues   Diagnosis and orders addressed:  1. Essential hypertension, benign Low sodium diet - CBC with Differential/Platelet - CMP14+EGFR - lisinopril (ZESTRIL) 2.5 MG tablet; Take 1 tablet (2.5 mg total) by mouth daily.  Dispense: 90 tablet; Refill: 1  2. Coronary artery disease involving native coronary artery of native heart with unstable angina pectoris [facility]) Keep follow up with cardiology every 6 months  3. Hyperlipidemia associated with type 2 diabetes mellitus (HCC) Low fat diet - Lipid panel - atorvastatin (LIPITOR) 80 MG tablet; Take 1 tablet (80 mg total) by mouth daily.  Dispense: 90 tablet; Refill: 1  4. Type 2 diabetes mellitus with complication, with long-term current use of insulin (HCC) Strict carb counting        - Bayer DCA Hb A1c Waived - insulin glargine (LANTUS) 100 UNIT/ML Solostar Pen; 40u in am and 70 u in evening  Dispense: 15 mL; Refill: 11 - metFORMIN (GLUCOPHAGE) 500 MG tablet; Take 2 tablets (1,000 mg total) by mouth 2 (two) times daily.  Dispense: 120 tablet; Refill: 1 - NOVOLOG FLEXPEN 100 UNIT/ML FlexPen; Up toINJECT 40 TO 75 UNITS PER SLIDING SCALE SUBCUTANEOUSLY TWICE DAILY  Dispense: 15 mL; Refill: 3  5. Gastroesophageal reflux disease without esophagitis Avoid spicy foods Do not eat 2 hours prior to bedtime - pantoprazole (PROTONIX) 40 MG tablet; Take 1 tablet (40 mg total) by mouth daily as needed (for  acid reflux).  Dispense: 90 tablet; Refill: 1  6. Benign prostatic hyperplasia with urinary hesitancy Report any voiding problems - tamsulosin (FLOMAX) 0.4 MG CAPS capsule; Take 1 capsule (0.4 mg total) by mouth at bedtime.  Dispense: 90 capsule; Refill: 1  7. BMI 36.0-36.9,adult Discussed diet and exercise for person with BMI >25 Will recheck weight in 3-6 months    Labs pending Health Maintenance reviewed Diet and exercise encouraged  Follow up plan: 3 months   Mary-Margaret Daphine Deutscher, FNP

## 2022-11-20 NOTE — Patient Instructions (Signed)

## 2022-11-21 LAB — CMP14+EGFR
ALT: 27 IU/L (ref 0–44)
AST: 24 IU/L (ref 0–40)
Albumin: 4.1 g/dL (ref 3.9–4.9)
Alkaline Phosphatase: 64 IU/L (ref 44–121)
BUN/Creatinine Ratio: 20 (ref 10–24)
BUN: 20 mg/dL (ref 8–27)
Bilirubin Total: 0.5 mg/dL (ref 0.0–1.2)
CO2: 22 mmol/L (ref 20–29)
Calcium: 9.1 mg/dL (ref 8.6–10.2)
Chloride: 98 mmol/L (ref 96–106)
Creatinine, Ser: 1.01 mg/dL (ref 0.76–1.27)
Globulin, Total: 2.6 g/dL (ref 1.5–4.5)
Glucose: 240 mg/dL — ABNORMAL HIGH (ref 70–99)
Potassium: 4.9 mmol/L (ref 3.5–5.2)
Sodium: 139 mmol/L (ref 134–144)
Total Protein: 6.7 g/dL (ref 6.0–8.5)
eGFR: 81 mL/min/{1.73_m2} (ref >59)

## 2022-11-21 LAB — CBC WITH DIFFERENTIAL/PLATELET
Basophils Absolute: 0 10*3/uL (ref 0.0–0.2)
Basos: 0 %
EOS (ABSOLUTE): 0.2 10*3/uL (ref 0.0–0.4)
Eos: 4 %
Hematocrit: 40.2 % (ref 37.5–51.0)
Hemoglobin: 12.8 g/dL — ABNORMAL LOW (ref 13.0–17.7)
Immature Grans (Abs): 0 10*3/uL (ref 0.0–0.1)
Immature Granulocytes: 0 %
Lymphocytes Absolute: 2.1 10*3/uL (ref 0.7–3.1)
Lymphs: 35 %
MCH: 27.4 pg (ref 26.6–33.0)
MCHC: 31.8 g/dL (ref 31.5–35.7)
MCV: 86 fL (ref 79–97)
Monocytes Absolute: 0.7 10*3/uL (ref 0.1–0.9)
Monocytes: 12 %
Neutrophils Absolute: 2.9 10*3/uL (ref 1.4–7.0)
Neutrophils: 49 %
Platelets: 260 10*3/uL (ref 150–450)
RBC: 4.68 x10E6/uL (ref 4.14–5.80)
RDW: 14.5 % (ref 11.6–15.4)
WBC: 6 10*3/uL (ref 3.4–10.8)

## 2022-11-21 LAB — LIPID PANEL
Chol/HDL Ratio: 4.6 ratio (ref 0.0–5.0)
Cholesterol, Total: 97 mg/dL — ABNORMAL LOW (ref 100–199)
HDL: 21 mg/dL — ABNORMAL LOW (ref >39)
LDL Chol Calc (NIH): 40 mg/dL (ref 0–99)
Triglycerides: 222 mg/dL — ABNORMAL HIGH (ref 0–149)
VLDL Cholesterol Cal: 36 mg/dL (ref 5–40)

## 2022-11-24 ENCOUNTER — Telehealth: Payer: Self-pay | Admitting: Nurse Practitioner

## 2022-11-24 ENCOUNTER — Other Ambulatory Visit: Payer: Self-pay | Admitting: Nurse Practitioner

## 2022-11-24 MED ORDER — FREESTYLE LIBRE 3 SENSOR MISC: 1.0000 | 5 refills | Status: DC

## 2022-12-30 ENCOUNTER — Telehealth: Payer: Self-pay | Admitting: Nurse Practitioner

## 2022-12-30 MED ORDER — FREESTYLE LIBRE 3 SENSOR MISC: 1.0000 | 5 refills | Status: DC

## 2022-12-30 NOTE — Telephone Encounter (Signed)
Pt found Continuous Glucose Sensor (FREESTYLE LIBRE 3 SENSOR) MISC rx in stock at [facility] in [place].

## 2022-12-30 NOTE — Telephone Encounter (Signed)
Aware sent sensors to [facility] pharmacy

## 2022-12-31 DIAGNOSIS — M79676 Pain in unspecified toe(s): Secondary | ICD-10-CM | POA: Diagnosis not present

## 2022-12-31 DIAGNOSIS — B351 Tinea unguium: Secondary | ICD-10-CM | POA: Diagnosis not present

## 2022-12-31 DIAGNOSIS — E1142 Type 2 diabetes mellitus with diabetic polyneuropathy: Secondary | ICD-10-CM | POA: Diagnosis not present

## 2022-12-31 DIAGNOSIS — L84 Corns and callosities: Secondary | ICD-10-CM | POA: Diagnosis not present

## 2023-01-04 ENCOUNTER — Other Ambulatory Visit: Payer: Self-pay | Admitting: Nurse Practitioner

## 2023-01-04 DIAGNOSIS — E118 Type 2 diabetes mellitus with unspecified complications: Secondary | ICD-10-CM

## 2023-01-13 ENCOUNTER — Other Ambulatory Visit: Payer: Self-pay | Admitting: Nurse Practitioner

## 2023-01-13 DIAGNOSIS — E118 Type 2 diabetes mellitus with unspecified complications: Secondary | ICD-10-CM

## 2023-01-22 ENCOUNTER — Telehealth: Payer: Self-pay | Admitting: *Deleted

## 2023-01-22 MED ORDER — FREESTYLE LIBRE 3 PLUS SENSOR MISC: 5 refills | Status: DC

## 2023-01-22 NOTE — Addendum Note (Signed)
Addended by: Bennie Pierini on: 01/22/2023 04:34 PM   Modules accepted: Orders

## 2023-01-22 NOTE — Telephone Encounter (Signed)
Fax from [facility] Note: Freestyle Libre 3 on backorder Can this be switched to [place] plus

## 2023-01-24 ENCOUNTER — Other Ambulatory Visit: Payer: Self-pay | Admitting: Nurse Practitioner

## 2023-01-24 DIAGNOSIS — E118 Type 2 diabetes mellitus with unspecified complications: Secondary | ICD-10-CM

## 2023-01-27 ENCOUNTER — Other Ambulatory Visit: Payer: Self-pay | Admitting: Nurse Practitioner

## 2023-01-27 DIAGNOSIS — E118 Type 2 diabetes mellitus with unspecified complications: Secondary | ICD-10-CM

## 2023-01-27 DIAGNOSIS — E1159 Type 2 diabetes mellitus with other circulatory complications: Secondary | ICD-10-CM

## 2023-02-03 ENCOUNTER — Other Ambulatory Visit: Payer: Self-pay | Admitting: Nurse Practitioner

## 2023-02-03 DIAGNOSIS — Z794 Long term (current) use of insulin: Secondary | ICD-10-CM

## 2023-02-15 ENCOUNTER — Other Ambulatory Visit: Payer: Self-pay | Admitting: Nurse Practitioner

## 2023-02-15 DIAGNOSIS — E118 Type 2 diabetes mellitus with unspecified complications: Secondary | ICD-10-CM

## 2023-02-19 ENCOUNTER — Other Ambulatory Visit: Payer: Self-pay | Admitting: Nurse Practitioner

## 2023-02-21 ENCOUNTER — Other Ambulatory Visit: Payer: Self-pay | Admitting: Nurse Practitioner

## 2023-02-21 DIAGNOSIS — E118 Type 2 diabetes mellitus with unspecified complications: Secondary | ICD-10-CM

## 2023-02-22 ENCOUNTER — Ambulatory Visit: Payer: Medicare PPO | Admitting: Nurse Practitioner

## 2023-02-22 VITALS — BP 126/63 | HR 79 | Temp 98.0°F | Resp 20 | Ht 68.0 in | Wt 250.0 lb

## 2023-02-22 DIAGNOSIS — E785 Hyperlipidemia, unspecified: Secondary | ICD-10-CM | POA: Diagnosis not present

## 2023-02-22 DIAGNOSIS — I25110 Atherosclerotic heart disease of native coronary artery with unstable angina pectoris: Secondary | ICD-10-CM | POA: Diagnosis not present

## 2023-02-22 DIAGNOSIS — E1159 Type 2 diabetes mellitus with other circulatory complications: Secondary | ICD-10-CM | POA: Diagnosis not present

## 2023-02-22 DIAGNOSIS — E1169 Type 2 diabetes mellitus with other specified complication: Secondary | ICD-10-CM

## 2023-02-22 DIAGNOSIS — Z794 Long term (current) use of insulin: Secondary | ICD-10-CM

## 2023-02-22 DIAGNOSIS — N401 Enlarged prostate with lower urinary tract symptoms: Secondary | ICD-10-CM

## 2023-02-22 DIAGNOSIS — Z6836 Body mass index (BMI) 36.0-36.9, adult: Secondary | ICD-10-CM | POA: Diagnosis not present

## 2023-02-22 DIAGNOSIS — I200 Unstable angina: Secondary | ICD-10-CM

## 2023-02-22 DIAGNOSIS — I10 Essential (primary) hypertension: Secondary | ICD-10-CM | POA: Diagnosis not present

## 2023-02-22 DIAGNOSIS — K219 Gastro-esophageal reflux disease without esophagitis: Secondary | ICD-10-CM

## 2023-02-22 DIAGNOSIS — E118 Type 2 diabetes mellitus with unspecified complications: Secondary | ICD-10-CM

## 2023-02-22 DIAGNOSIS — R3911 Hesitancy of micturition: Secondary | ICD-10-CM

## 2023-02-22 LAB — BAYER DCA HB A1C WAIVED: HB A1C (BAYER DCA - WAIVED): 6.9 % — ABNORMAL HIGH (ref 4.8–5.6)

## 2023-02-22 MED ORDER — LISINOPRIL 2.5 MG PO TABS: 2.5000 mg | ORAL_TABLET | Freq: Every day | ORAL | 1 refills | Status: DC

## 2023-02-22 MED ORDER — INSULIN GLARGINE 100 UNIT/ML SOLOSTAR PEN: PEN_INJECTOR | SUBCUTANEOUS | 11 refills | Status: DC

## 2023-02-22 MED ORDER — OZEMPIC (2 MG/DOSE) 8 MG/3ML ~~LOC~~ SOPN: 2.0000 mg | PEN_INJECTOR | SUBCUTANEOUS | 5 refills | Status: DC

## 2023-02-22 MED ORDER — TAMSULOSIN HCL 0.4 MG PO CAPS: 0.4000 mg | ORAL_CAPSULE | Freq: Every day | ORAL | 1 refills | Status: DC

## 2023-02-22 MED ORDER — PANTOPRAZOLE SODIUM 40 MG PO TBEC: 40.0000 mg | DELAYED_RELEASE_TABLET | Freq: Every day | ORAL | 1 refills | Status: DC | PRN

## 2023-02-22 MED ORDER — ATORVASTATIN CALCIUM 80 MG PO TABS: 80.0000 mg | ORAL_TABLET | Freq: Every day | ORAL | 1 refills | Status: DC

## 2023-02-22 MED ORDER — NOVOLOG FLEXPEN 100 UNIT/ML ~~LOC~~ SOPN: PEN_INJECTOR | SUBCUTANEOUS | 3 refills | Status: DC

## 2023-02-22 MED ORDER — METFORMIN HCL 500 MG PO TABS: 1000.0000 mg | ORAL_TABLET | Freq: Two times a day (BID) | ORAL | 1 refills | Status: DC

## 2023-02-22 NOTE — Progress Notes (Signed)
Subjective:    Patient ID: [name] [name], male    DOB: 1956-03-02, 67 y.o.   MRN: [id number]   Chief Complaint: medical management of chronic issues     HPI:  [name] [name] is a 67 y.o. who identifies as a male who was assigned male at birth.   Social history: Lives with: wife Work history: retired Buyer, retail   Comes in today for follow up of the following chronic medical issues:  1. Essential hypertension, benign No c/o chest pain, sob or headache. Doe snot check blood pressure at home. BP Readings from Last 3 Encounters:  11/20/22 (!) 143/69  09/28/22 128/82  08/17/22 (!) 146/69     2. Accelerating angina (HCC) 3. Coronary artery disease involving native coronary artery of native heart with unstable angina pectoris [facility]) Last saw cardiology on 09/28/22. No changes were made to plan of care  He was to follow up in 6 months  4. Hyperlipidemia associated with type 2 diabetes mellitus (HCC) Does not watch diet very closely and does very little exercise. Lab Results  Component Value Date   CHOL 97 (L) 11/20/2022   HDL 21 (L) 11/20/2022   LDLCALC 40 11/20/2022   TRIG 222 (H) 11/20/2022   CHOLHDL 4.6 11/20/2022     5. Type 2 diabetes mellitus with complication, with long-term current use of insulin (HCC) Fating blood sugars are running around 90-110. Some  low blood sugars at night. Has dexcom and checks it frequently Lab Results  Component Value Date   HGBA1C 7.6 (H) 11/20/2022     6. Gastroesophageal reflux disease without esophagitis S on daily dose of protonix and is dong well.  7. Benign prostatic hyperplasia with urinary hesitancy No voiding issues  8. BMI 36.0-36.9,adult Weight is up 6lbs Wt Readings from Last 3 Encounters:  02/22/23 250 lb (113.4 kg)  11/20/22 244 lb (110.7 kg)  09/28/22 242 lb (109.8 kg)   BMI Readings from Last 3 Encounters:  02/22/23 38.01 kg/m  11/20/22 37.10 kg/m  09/28/22 36.26 kg/m      New  complaints: None today  No Known Allergies Outpatient Encounter Medications as of 02/22/2023  Medication Sig   Accu-Chek Softclix Lancets lancets CHECK BLOOD SUGAR UP TO 4 TIMES A DAY.Dx E11.8   aspirin EC 81 MG tablet Take 1 tablet (81 mg total) by mouth daily. Swallow whole.   atorvastatin (LIPITOR) 80 MG tablet Take 1 tablet (80 mg total) by mouth daily.   blood glucose meter kit and supplies Dispense based on patient and insurance preference. Use up to four times daily as directed. (FOR ICD-10 E10.9, E11.9).   carvedilol (COREG) 12.5 MG tablet TAKE 1 TABLET BY MOUTH THREE TIMES DAILY   CINNAMON PO Take 1 tablet by mouth daily.   Continuous Glucose Sensor (FREESTYLE LIBRE 3 PLUS SENSOR) MISC Change sensor every 15 days.   Continuous Glucose Sensor (FREESTYLE LIBRE 3 SENSOR) MISC 1 each by Does not apply route every 14 (fourteen) days. Place 1 sensor on the skin every 14 days. Use to check glucose continuously   CRANBERRY PO Take 1 tablet by mouth 2 (two) times daily.   furosemide (LASIX) 20 MG tablet TAKE 1 TABLET BY MOUTH ONCE DAILY AS NEEDED FOR LEG SWELLING   glucose blood (ACCU-CHEK AVIVA PLUS) test strip CHECK BLOOD SUGAR UP TO 4 TIMES A DAY.Dx E11.8   insulin glargine (LANTUS) 100 UNIT/ML Solostar Pen 40u in am and 70 u in evening   Insulin Pen Needle (  B-D ULTRAFINE III SHORT PEN) 31G X 8 MM MISC Use up to 8 times daily as needed with insulin Dx E11.8   Insulin Pen Needle 32G X 4 MM MISC 1 each by Does not apply route daily.   isosorbide mononitrate (IMDUR) 30 MG 24 hr tablet Take 0.5 tablets (15 mg total) by mouth every evening.   lisinopril (ZESTRIL) 2.5 MG tablet Take 1 tablet (2.5 mg total) by mouth daily.   loratadine (CLARITIN) 10 MG tablet Take 10 mg by mouth daily as needed.   meclizine (ANTIVERT) 25 MG tablet Take 25 mg by mouth daily as needed for dizziness.   metFORMIN (GLUCOPHAGE) 500 MG tablet Take 2 tablets by mouth twice daily   nitroGLYCERIN (NITROSTAT) 0.4 MG SL  tablet Place 1 tablet (0.4 mg total) under the tongue every 5 (five) minutes x 3 doses as needed for chest pain.   NOVOLOG FLEXPEN 100 UNIT/ML FlexPen INJECT 40 TO 75 UNITS SUBCUTANEOUSLY TWICE DAILY PER  SLIDING  SCALE.   ondansetron (ZOFRAN-ODT) 4 MG disintegrating tablet Take 4 mg by mouth daily as needed for nausea.   pantoprazole (PROTONIX) 40 MG tablet Take 1 tablet (40 mg total) by mouth daily as needed (for acid reflux).   Semaglutide, 2 MG/DOSE, (OZEMPIC, 2 MG/DOSE,) 8 MG/3ML SOPN INJECT 2 MG ONCE A WEEK AS  DIRECTED   tamsulosin (FLOMAX) 0.4 MG CAPS capsule Take 1 capsule (0.4 mg total) by mouth at bedtime.   No facility-administered encounter medications on file as of 02/22/2023.    Past Surgical History:  Procedure Laterality Date   CARDIAC CATHETERIZATION  2012   CAROTID STENT     stents x 2    CHOLECYSTECTOMY N/A 12/04/2015   Procedure: LAPAROSCOPIC CHOLECYSTECTOMY;  Surgeon: Franky Macho, MD;  Location: [facility];  Service: General;  Laterality: N/A;   CHOLECYSTECTOMY, LAPAROSCOPIC  12/05/2015   CORONARY ANGIOPLASTY  2012   STENT X 1 2012, STENT X 1 YRS BEFORE   CORONARY ARTERY BYPASS GRAFT N/A 08/21/2020   Procedure: CORONARY ARTERY BYPASS GRAFTING (CABG) X 4, USING LEFT INTERNAL MAMMARY ARTERY AND RIGHT GREATER SAPHENOUS VEIN HARVESTED ENDOSCOPICALLY. SVG TO OM1, OM3 SEQUENTIALLY, SVG TO DIAG., LIMA TO LAD;  Surgeon: Loreli Slot, MD;  Location: [facility] OR;  Service: Open Heart Surgery;  Laterality: N/A;   CORONARY ATHERECTOMY N/A 02/09/2020   Procedure: CORONARY ATHERECTOMY;  Surgeon: Iran Ouch, MD;  Location: [facility] INVASIVE CV LAB;  Service: Cardiovascular;  Laterality: N/A;   CORONARY IMAGING/OCT N/A 05/22/2020   Procedure: INTRAVASCULAR IMAGING/OCT;  Surgeon: Corky Crafts, MD;  Location: [facility] INVASIVE CV LAB;  Service: Cardiovascular;  Laterality: N/A;   CORONARY STENT INTERVENTION N/A 02/09/2020   Procedure: CORONARY STENT INTERVENTION;  Surgeon: Iran Ouch, MD;  Location: [facility] INVASIVE CV LAB;  Service: Cardiovascular;  Laterality: N/A;   CORONARY STENT INTERVENTION N/A 05/22/2020   Procedure: CORONARY STENT INTERVENTION;  Surgeon: Corky Crafts, MD;  Location: [facility] INVASIVE CV LAB;  Service: Cardiovascular;  Laterality: N/A;   CORONARY ULTRASOUND/IVUS N/A 02/09/2020   Procedure: Intravascular Ultrasound/IVUS;  Surgeon: Iran Ouch, MD;  Location: [facility] INVASIVE CV LAB;  Service: Cardiovascular;  Laterality: N/A;   ENDOVEIN HARVEST OF GREATER SAPHENOUS VEIN Right 08/21/2020   Procedure: ENDOVEIN HARVEST OF GREATER SAPHENOUS VEIN;  Surgeon: Loreli Slot, MD;  Location: [facility] OR;  Service: Open Heart Surgery;  Laterality: Right;   HYDROCELE EXCISION Bilateral 06/02/2017   Procedure: HYDROCELECTOMY ADULT;  Surgeon: Rene Paci, MD;  Location: [facility];  Service: Urology;  Laterality: Bilateral;  ONLY NEEDS 45 MIN   INGUINAL HERNIA REPAIR     RIGHT GROIN   KNEE ARTHROSCOPY Left 05/23/2019   Procedure: LEFT KNEE ARTHROSCOPY AND DEBRIDEMENT PARTIAL MEDIAL MENISECTOMY;  Surgeon: Nadara Mustard, MD;  Location: [facility];  Service: Orthopedics;  Laterality: Left;   LEFT HEART CATH AND CORONARY ANGIOGRAPHY N/A 02/07/2020   Procedure: LEFT HEART CATH AND CORONARY ANGIOGRAPHY;  Surgeon: Marykay Lex, MD;  Location: [facility] INVASIVE CV LAB;  Service: Cardiovascular;  Laterality: N/A;   LEFT HEART CATH AND CORONARY ANGIOGRAPHY N/A 05/22/2020   Procedure: LEFT HEART CATH AND CORONARY ANGIOGRAPHY;  Surgeon: Corky Crafts, MD;  Location: [facility] INVASIVE CV LAB;  Service: Cardiovascular;  Laterality: N/A;   LEFT HEART CATH AND CORONARY ANGIOGRAPHY N/A 08/15/2020   Procedure: LEFT HEART CATH AND CORONARY ANGIOGRAPHY;  Surgeon: Runell Gess, MD;  Location: [facility] INVASIVE CV LAB;  Service: Cardiovascular;  Laterality: N/A;   TEE WITHOUT CARDIOVERSION N/A 08/21/2020   Procedure: TRANSESOPHAGEAL ECHOCARDIOGRAM (TEE);   Surgeon: Loreli Slot, MD;  Location: [facility] OR;  Service: Open Heart Surgery;  Laterality: N/A;   TRIGGER FINGER RELEASE Right 01/08/2015   Procedure: RELEASE TRIGGER FINGER/A-1 PULLEY RIGHT MIDDLE FINGER;  Surgeon: Cindee Salt, MD;  Location: [facility];  Service: Orthopedics;  Laterality: Right;    Family History  Problem Relation Age of Onset   Hyperlipidemia Mother    Hypertension Mother    Hyperlipidemia Father    Hypertension Father    Diabetes Father    Dementia Father    Diabetes Maternal Grandfather       Controlled substance contract: n/a     Review of Systems  Constitutional:  Negative for diaphoresis.  Eyes:  Negative for pain.  Respiratory:  Negative for shortness of breath.   Cardiovascular:  Negative for chest pain, palpitations and leg swelling.  Gastrointestinal:  Negative for abdominal pain.  Endocrine: Negative for polydipsia.  Skin:  Negative for rash.  Neurological:  Negative for dizziness, weakness and headaches.  Hematological:  Does not bruise/bleed easily.  All other systems reviewed and are negative.      Objective:   Physical Exam Vitals and nursing note reviewed.  Constitutional:      Appearance: Normal appearance. He is well-developed.  HENT:     Head: Normocephalic.     Nose: Nose normal.     Mouth/Throat:     Mouth: Mucous membranes are moist.     Pharynx: Oropharynx is clear.  Eyes:     Pupils: Pupils are equal, round, and reactive to light.  Neck:     Thyroid: No thyroid mass or thyromegaly.     Vascular: No carotid bruit or JVD.     Trachea: Phonation normal.  Cardiovascular:     Rate and Rhythm: Normal rate and regular rhythm.  Pulmonary:     Effort: Pulmonary effort is normal. No respiratory distress.     Breath sounds: Normal breath sounds.  Abdominal:     General: Bowel sounds are normal.     Palpations: Abdomen is soft.     Tenderness: There is no abdominal tenderness.  Musculoskeletal:         General: Normal range of motion.     Cervical back: Normal range of motion and neck supple.     Right lower leg: No edema.     Left lower leg: No edema.  Lymphadenopathy:  Cervical: No cervical adenopathy.  Skin:    General: Skin is warm and dry.  Neurological:     Mental Status: He is alert and oriented to person, place, and time.  Psychiatric:        Behavior: Behavior normal.        Thought Content: Thought content normal.        Judgment: Judgment normal.    BP 126/63   Pulse 79   Temp 98 F (36.7 C) (Temporal)   Resp 20   Ht 5\' 8"  (1.727 m)   Wt 250 lb (113.4 kg)   SpO2 97%   BMI 38.01 kg/m    HGBA1c 6.9%     Assessment & Plan:  [name] [name] comes in today with chief complaint of Medical Management of Chronic Issues   Diagnosis and orders addressed:  1. Essential hypertension, benign Low sodium diet - CBC with Differential/Platelet - CMP14+EGFR - lisinopril (ZESTRIL) 2.5 MG tablet; Take 1 tablet (2.5 mg total) by mouth daily.  Dispense: 90 tablet; Refill: 1  2. Accelerating angina (HCC)  3. Coronary artery disease involving native coronary artery of native heart with unstable angina pectoris [facility]) Keep follow up with cardiology  4. Hyperlipidemia associated with type 2 diabetes mellitus (HCC) Low fat diet - Lipid panel - atorvastatin (LIPITOR) 80 MG tablet; Take 1 tablet (80 mg total) by mouth daily.  Dispense: 90 tablet; Refill: 1  5. Type 2 diabetes mellitus with complication, with long-term current use of insulin (HCC) Continue to watch carbs in diet - Bayer DCA Hb A1c Waived - Microalbumin / creatinine urine ratio - insulin glargine (LANTUS) 100 UNIT/ML Solostar Pen; 40u in am and 70 u in evening  Dispense: 15 mL; Refill: 11 - metFORMIN (GLUCOPHAGE) 500 MG tablet; Take 2 tablets (1,000 mg total) by mouth 2 (two) times daily.  Dispense: 120 tablet; Refill: 1 - NOVOLOG FLEXPEN 100 UNIT/ML FlexPen; INJECT 40 TO 75 UNITS SUBCUTANEOUSLY TWICE DAILY  PER SLIDING SCALE  Dispense: 15 mL; Refill: 3  6. Gastroesophageal reflux disease without esophagitis Avoid spicy foods Do not eat 2 hours prior to bedtime  - pantoprazole (PROTONIX) 40 MG tablet; Take 1 tablet (40 mg total) by mouth daily as needed (for acid reflux).  Dispense: 90 tablet; Refill: 1  7. Benign prostatic hyperplasia with urinary hesitancy - tamsulosin (FLOMAX) 0.4 MG CAPS capsule; Take 1 capsule (0.4 mg total) by mouth at bedtime.  Dispense: 90 capsule; Refill: 1  8. BMI 36.0-36.9,adult Discussed diet and exercise for person with BMI >25 Will recheck weight in 3-6 months   9. Type 2 diabetes mellitus with other circulatory complication, with long-term current use of insulin (HCC) - Semaglutide, 2 MG/DOSE, (OZEMPIC, 2 MG/DOSE,) 8 MG/3ML SOPN; Inject 2 mg into the skin once a week.  Dispense: 9 mL; Refill: 5   Labs pending Health Maintenance reviewed Diet and exercise encouraged  Follow up plan: 6 months   Mary-Margaret Daphine Deutscher, FNP

## 2023-02-23 LAB — CMP14+EGFR
ALT: 39 [IU]/L (ref 0–44)
AST: 33 [IU]/L (ref 0–40)
Albumin: 4.4 g/dL (ref 3.9–4.9)
Alkaline Phosphatase: 62 [IU]/L (ref 44–121)
BUN/Creatinine Ratio: 18 (ref 10–24)
BUN: 19 mg/dL (ref 8–27)
Bilirubin Total: 0.5 mg/dL (ref 0.0–1.2)
CO2: 24 mmol/L (ref 20–29)
Calcium: 9.3 mg/dL (ref 8.6–10.2)
Chloride: 98 mmol/L (ref 96–106)
Creatinine, Ser: 1.06 mg/dL (ref 0.76–1.27)
Globulin, Total: 2.6 g/dL (ref 1.5–4.5)
Glucose: 144 mg/dL — ABNORMAL HIGH (ref 70–99)
Potassium: 4.9 mmol/L (ref 3.5–5.2)
Sodium: 139 mmol/L (ref 134–144)
Total Protein: 7 g/dL (ref 6.0–8.5)
eGFR: 76 mL/min/{1.73_m2} (ref >59)

## 2023-02-23 LAB — CBC WITH DIFFERENTIAL/PLATELET
Basophils Absolute: 0 10*3/uL (ref 0.0–0.2)
Basos: 1 %
EOS (ABSOLUTE): 0.2 10*3/uL (ref 0.0–0.4)
Eos: 3 %
Hematocrit: 42.9 % (ref 37.5–51.0)
Hemoglobin: 13.6 g/dL (ref 13.0–17.7)
Immature Grans (Abs): 0 10*3/uL (ref 0.0–0.1)
Immature Granulocytes: 0 %
Lymphocytes Absolute: 2 10*3/uL (ref 0.7–3.1)
Lymphs: 31 %
MCH: 27.7 pg (ref 26.6–33.0)
MCHC: 31.7 g/dL (ref 31.5–35.7)
MCV: 87 fL (ref 79–97)
Monocytes Absolute: 0.7 10*3/uL (ref 0.1–0.9)
Monocytes: 11 %
Neutrophils Absolute: 3.6 10*3/uL (ref 1.4–7.0)
Neutrophils: 54 %
Platelets: 294 10*3/uL (ref 150–450)
RBC: 4.91 x10E6/uL (ref 4.14–5.80)
RDW: 14.2 % (ref 11.6–15.4)
WBC: 6.6 10*3/uL (ref 3.4–10.8)

## 2023-02-23 LAB — LIPID PANEL
Chol/HDL Ratio: 4.4 {ratio} (ref 0.0–5.0)
Cholesterol, Total: 97 mg/dL — ABNORMAL LOW (ref 100–199)
HDL: 22 mg/dL — ABNORMAL LOW (ref >39)
LDL Chol Calc (NIH): 40 mg/dL (ref 0–99)
Triglycerides: 219 mg/dL — ABNORMAL HIGH (ref 0–149)
VLDL Cholesterol Cal: 35 mg/dL (ref 5–40)

## 2023-02-23 LAB — MICROALBUMIN / CREATININE URINE RATIO
Creatinine, Urine: 96.6 mg/dL
Microalb/Creat Ratio: 22 mg/g{creat} (ref 0–29)
Microalbumin, Urine: 21.2 ug/mL

## 2023-02-28 ENCOUNTER — Other Ambulatory Visit: Payer: Self-pay | Admitting: Nurse Practitioner

## 2023-02-28 DIAGNOSIS — E118 Type 2 diabetes mellitus with unspecified complications: Secondary | ICD-10-CM

## 2023-03-02 ENCOUNTER — Other Ambulatory Visit: Payer: Self-pay | Admitting: Nurse Practitioner

## 2023-03-02 DIAGNOSIS — Z794 Long term (current) use of insulin: Secondary | ICD-10-CM

## 2023-03-15 DIAGNOSIS — E113392 Type 2 diabetes mellitus with moderate nonproliferative diabetic retinopathy without macular edema, left eye: Secondary | ICD-10-CM | POA: Diagnosis not present

## 2023-03-15 DIAGNOSIS — H25813 Combined forms of age-related cataract, bilateral: Secondary | ICD-10-CM | POA: Diagnosis not present

## 2023-03-15 DIAGNOSIS — E113491 Type 2 diabetes mellitus with severe nonproliferative diabetic retinopathy without macular edema, right eye: Secondary | ICD-10-CM | POA: Diagnosis not present

## 2023-03-15 LAB — HM DIABETES EYE EXAM

## 2023-03-26 DIAGNOSIS — H2513 Age-related nuclear cataract, bilateral: Secondary | ICD-10-CM | POA: Diagnosis not present

## 2023-03-26 DIAGNOSIS — H43813 Vitreous degeneration, bilateral: Secondary | ICD-10-CM | POA: Diagnosis not present

## 2023-03-26 DIAGNOSIS — E113393 Type 2 diabetes mellitus with moderate nonproliferative diabetic retinopathy without macular edema, bilateral: Secondary | ICD-10-CM | POA: Diagnosis not present

## 2023-03-26 DIAGNOSIS — H35373 Puckering of macula, bilateral: Secondary | ICD-10-CM | POA: Diagnosis not present

## 2023-04-01 ENCOUNTER — Encounter: Payer: Self-pay | Admitting: Nurse Practitioner

## 2023-04-01 ENCOUNTER — Ambulatory Visit: Payer: Medicare PPO | Attending: Nurse Practitioner | Admitting: Nurse Practitioner

## 2023-04-01 VITALS — BP 128/72 | HR 81 | Ht 68.0 in | Wt 254.4 lb

## 2023-04-01 DIAGNOSIS — I10 Essential (primary) hypertension: Secondary | ICD-10-CM | POA: Diagnosis not present

## 2023-04-01 DIAGNOSIS — I25119 Atherosclerotic heart disease of native coronary artery with unspecified angina pectoris: Secondary | ICD-10-CM | POA: Diagnosis not present

## 2023-04-01 DIAGNOSIS — E669 Obesity, unspecified: Secondary | ICD-10-CM | POA: Diagnosis not present

## 2023-04-01 DIAGNOSIS — E782 Mixed hyperlipidemia: Secondary | ICD-10-CM

## 2023-04-01 MED ORDER — ISOSORBIDE MONONITRATE ER 30 MG PO TB24: 30.0000 mg | ORAL_TABLET | Freq: Every evening | ORAL | Status: DC

## 2023-04-01 MED ORDER — CARVEDILOL 6.25 MG PO TABS: 6.2500 mg | ORAL_TABLET | Freq: Two times a day (BID) | ORAL | 0 refills | Status: DC

## 2023-04-01 MED ORDER — ISOSORBIDE MONONITRATE ER 30 MG PO TB24: 30.0000 mg | ORAL_TABLET | Freq: Every evening | ORAL | 1 refills | Status: DC

## 2023-04-01 NOTE — Patient Instructions (Addendum)
 Medication Instructions:  Your physician has recommended you make the following change in your medication:  Please Increase isosorbide  mononitrate (IMDUR ) to 30 MG daily  Please Decrease carvedilol  (COREG ) to 6.25 MG tablet twice daily   Labwork: None   Testing/Procedures: None   Follow-Up: Your physician recommends that you schedule a follow-up appointment in: 2-3 months   Any Other Special Instructions Will Be Listed Below (If Applicable).  If you need a refill on your cardiac medications before your next appointment, please call your pharmacy.

## 2023-04-01 NOTE — Progress Notes (Signed)
 Cardiology Office Note:  .   Date:  04/01/2023 ID:  [name] [name], DOB [birth year], MRN [id number] PCP: Gladis Mustard, FNP  [facility] Providers Cardiologist:  [name], MD    History of Present Illness: .   [name] [name] is a 68 y.o. male with a PMH of CAD, s/p CABG in 2022, mixed HLD, HTN, T2DM, and obesity, who presents today for 68-month follow-up.  Previous cardiovascular history of drug-eluting stent to the distal circumflex in 2005.  In 2012, he received drug-eluting stent to the LAD/diagonal bifurcation.  In 2021 he received drug-eluting stent to the ostial LAD and drug-eluting stent to the posterolateral in 2021, ultimately underwent CABG in May 2022 with SVG-diagonal, LIMA-LAD, SVG-OM1 and OM 3.  EF at that time was 60-65%.  Last seen by Dr. Sierras on September 28, 2022.  He was overall doing well at the time.  Today he presents for follow-up. Admits to chest pain ever since his surgery that is noticed during cold/ rainy/wet weather. Doesn't notice this in the summer months. States it occurs across his chest, describes pain as a burning sensation or a hot poker is in his chest. Denies any shortness of breath, palpitations, syncope, presyncope, dizziness, orthopnea, PND, swelling or significant weight changes, acute bleeding, or claudication.  ROS: Negative. See HPI.   Studies Reviewed: SABRA    EKG:  EKG Interpretation Date/Time:  Thursday April 01 2023 09:32:50 EST Ventricular Rate:  83 PR Interval:  196 QRS Duration:  94 QT Interval:  378 QTC Calculation: 444 R Axis:   82  Text Interpretation: Normal sinus rhythm ST & T wave abnormality, consider lateral ischemia When compared with ECG of 22-Aug-2020 06:58, Criteria for Septal infarct are no longer Present T wave inversion more evident in Lateral leads Confirmed by [name] [name] [phone]) on 04/01/2023 9:44:27 AM   Echo intraoperative TEE 07/2020:  POST-OP IMPRESSIONS  - Left Ventricle: The left ventricle  is unchanged from pre-bypass.  - Right Ventricle: The right ventricle appears unchanged from pre-bypass.  - Aorta: The aorta appears unchanged from pre-bypass.  - Left Atrium: The left atrium appears unchanged from pre-bypass.  - Left Atrial Appendage: The left atrial appendage appears unchanged from  pre-bypass.  - Aortic Valve: The aortic valve appears unchanged from pre-bypass.  - Mitral Valve: The mitral valve appears unchanged from pre-bypass.  - Tricuspid Valve: The tricuspid valve appears unchanged from pre-bypass.  - Pulmonic Valve: The pulmonic valve appears unchanged from pre-bypass.  - Interatrial Septum: The interatrial septum appears unchanged from  pre-bypass.  - Interventricular Septum: The interventricular septum appears unchanged  from  pre-bypass.  - Pericardium: The pericardium appears unchanged from pre-bypass.  Vascular doppler pre-CABG 07/2020:  Summary:  Right Carotid: Velocities in the right ICA are consistent with a 1-39%  stenosis.   Left Carotid: Velocities in the left ICA are consistent with a 1-39%  stenosis.  Vertebrals: Bilateral vertebral arteries demonstrate antegrade flow.   Right Upper Extremity: Doppler waveforms remain within normal limits with  right radial compression. Doppler waveforms remain within normal limits  with right ulnar compression.  Left Upper Extremity: Doppler waveforms remain within normal limits with left radial compression. Doppler waveforms decrease 50% with left ulnar compression.    Echo 07/2020:  1. Left ventricular ejection fraction, by estimation, is 60 to 65%. The  left ventricle has normal function. Left ventricular endocardial border  not optimally defined to evaluate regional wall motion. Grossly, no wall  motion abnormalities seen.  There is  mild concentric left ventricular hypertrophy. Left ventricular diastolic  parameters are consistent with Grade I diastolic dysfunction (impaired  relaxation). Elevated left  atrial pressure.   2. Right ventricular systolic function is normal. The right ventricular  size is normal. Tricuspid regurgitation signal is inadequate for assessing  PA pressure.   3. The mitral valve is normal in structure. No evidence of mitral valve  regurgitation.   4. The aortic valve is tricuspid. There is mild calcification of the  aortic valve. Aortic valve regurgitation is not visualized. Mild aortic  valve sclerosis is present, with no evidence of aortic valve stenosis.   Comparison(s): No significant change from prior study. Prior echo in  01/2020 demonstrated mild ascending aorta dilation (40mm) which was not  well visualized on current study. Will need continued monitoring as  outpatient.  LHC 07/2020:  Prox RCA lesion is 70% stenosed. LPAV lesion is 100% stenosed. Ost LAD to Prox LAD lesion is 95% stenosed. Mid LAD lesion is 80% stenosed. 2nd Mrg lesion is 90% stenosed. Previously placed 1st LPL stent (unknown type) is widely patent.  LHC 04/2020:  Prox RCA lesion is 70% stenosed. Dist LAD lesion is 60% stenosed. 1st Mrg lesion is 85% stenosed. 1st LPL-2 lesion is 80% stenosed. Previously placed 1st LPL-1 drug eluting stent is widely patent. LPAV-2 lesion is 100% stenosed. Dist LM to Prox LAD lesion is 90% stenosed, instent restenosis. Treated with 3.25 mm Wolverine and then 4.0 Gifford balloon. Dissection noted by OCT past the edge of old stent. A drug-eluting stent was successfully placed using a SYNERGY XD 3.50X12., post dilated to 4.0 mm. Post intervention, there is a 0% residual stenosis. Stent optimized with OCT. Mid LAD-1 lesion is 20% stenosed. Mid LAD-2 lesion is 75% stenosed. In-stent restenosis. Scoring balloon angioplasty was performed using a BALLOON WOLVERINE 3.25X10. Post intervention, there is a 0% residual stenosis. The left ventricular systolic function is normal. LV end diastolic pressure is normal. The left ventricular ejection fraction is 55-65% by  visual estimate. There is no aortic valve stenosis. A drug-eluting stent was successfully placed using a SYNERGY XD 3.50X12.   Continue dual antiplatelet therapy along with aggressive secondary prevention.    Diffuse, distal and small vessel disease in the LAD and OM territories.  Aggressive medical therapy.   Coronary atherectomy, LHC 01/2020: Prox RCA lesion is 70% stenosed. Dist LM to Prox LAD lesion is 95% stenosed. Dist LAD lesion is 60% stenosed. Mid LAD lesion is 20% stenosed. Prox LAD to Mid LAD lesion is 20% stenosed. 1st Mrg lesion is 85% stenosed. 1st LPL-1 lesion is 95% stenosed. 1st LPL-2 lesion is 80% stenosed. LPAV-1 lesion is 30% stenosed. LPAV-2 lesion is 100% stenosed. Post intervention, there is a 0% residual stenosis. A drug-eluting stent was successfully placed using a SYNERGY XD 3.50X12. Post intervention, there is a 0% residual stenosis. A drug-eluting stent was successfully placed using a SYNERGY XD 2.25X16.   1. Successful IVUS guided orbital atherectomy and drug eluting stent placement to the ostial LAD. 2.  Successful angioplasty and drug-eluting stent placement to first PL branch of the left circumflex.     Recommendations: Continue indefinite dual antiplatelet therapy. Aggressive treatment of risk factors.   LHC 01/2020: Ostial LAD lesion is 95% stenosed. (Image does not allow drawing ostial LAD lesions) Prox LAD to Mid LAD lesion is 20% stenosed. Mid LAD STENT is 20% stenosed Dist LAD lesion is 60% stenosed. Very small caliber 1st Mrg lesion is 85% stenosed. No  change from 2012 LPAV-1 lesion is 30% stenosed just prior to giving off LPL 1. LPAV-2 STENT is 100% stenosed -which leaves the L PDA occluded 1st LPL-1 lesion is 95% stenosed. 1st LPL-2 lesion is 80% stenosed. Prox RCA lesion is 70% stenosed. ---------------------------- The left ventricular ejection fraction is 35-45% by visual estimate. There is moderate left ventricular systolic  dysfunction. LV end diastolic pressure is normal.   SUMMARY Severe multivessel CAD:  Heavily calcified eccentric 95% ostial LAD with mild in-stent restenosis of proximal-mid LAD stent and downstream diffuse mild to moderate disease with a focal 60% lesion;  small caliber ramus intermedius mild diffuse disease, heavily diseased OM1 (no change from 2012).   Very patulous dilated dominant LCx: 100% CTO of previously placed Stent leading from the AV groove circumflex to the PDA (no collateral)  95% mid LPL stenosis.  Nondominant RCA with proximal 70%. At least moderately reduced LVEF estimated roughly 45% with apical anterior hypokinesis. Severely elevated LVEDP - 28 mm.     RECOMMENDATIONS Very difficult situation with significant disease.  Need to review with heart team approach discussed between CVTS surgeons and other IC physicians.  Brief discussion with Dr. Army would indicate that only the distal LAD would be a target for LIMA.  Dr. Darron feels that the ostial LAD could be treated with atherectomy and stenting as could the LPL lesion.  Perhaps this would be a better option, however we need to assess EF. Anticipate full CVTS consultation note tomorrow.  We will hold off on stopping Effient  until the decision is made about CABG versus PCI If CABG: Would not be until Wednesday 11/17 If PCI: Would wait until Friday 11/12 with Dr. Darron -> depending on EF, may or may not need to consider Impella support With unstable angina presentation-chest pain and dyspnea with minimal exertion, I do not feel comfortable discharging this gentleman till a full plan is in place.  We will admit him to telemetry on the Interventional Team Service to allow time for HEART TEAM DISCUSSION for best option going forward. Check 2D echocardiogram The patient has a relatively difficult diabetes regimen, will start with a Lantus  (at the lower end of his range) with mealtime and sliding scale coverage.  Diabetes  education team to assist since he has a [employer]' monitor. Continue to titrate antianginals per IC Team (Team C)   Physical Exam:   VS:  BP 128/72   Pulse 81   Ht 5' 8 (1.727 m)   Wt 254 lb 6.4 oz (115.4 kg)   SpO2 94%   BMI 38.68 kg/m    Wt Readings from Last 3 Encounters:  04/01/23 254 lb 6.4 oz (115.4 kg)  02/22/23 250 lb (113.4 kg)  11/20/22 244 lb (110.7 kg)    GEN: Obese, 68 y.o. male in no acute distress NECK: No JVD; No carotid bruits CARDIAC: S1/S2, RRR, no murmurs, rubs, gallops RESPIRATORY:  Clear to auscultation without rales, wheezing or rhonchi  ABDOMEN: Soft, non-tender, non-distended EXTREMITIES:  No edema; No deformity   ASSESSMENT AND PLAN: .    CAD, s/p CABG in 2022, vasospastic chest pain? Admits to stable, chronic chest pain noticed with cold/rainy or wet weather.  Possible etiology is due to vasospasm.  Will increase Imdur  to 30 mg daily, as well as decrease carvedilol  to 6.25 mg twice daily.  No other medication changes at this time.  Heart healthy diet and regular cardiovascular exercise encouraged.  Care and ED precautions discussed.  Mixed HLD LDL  40 in November 2024.  Triglycerides 219.  Patient is requesting to work on lifestyle changes first.  Mediterranean diet and regular cardiovascular exercise encouraged.  Continue current medication regimen. Continue to follow with PCP.  HTN Blood pressure stable. Discussed to monitor BP at home at least 2 hours after medications and sitting for 5-10 minutes.  No medication changes at this time except what is mentioned above.  Heart healthy diet and regular cardiovascular exercise encouraged.  Obesity Weight loss via diet and exercise encouraged. Discussed the impact being overweight would have on cardiovascular risk.   Dispo: Follow-up with me/APP in 2 to 3 months or sooner if anything changes.  Signed, Almarie Crate, NP

## 2023-04-08 DIAGNOSIS — H2512 Age-related nuclear cataract, left eye: Secondary | ICD-10-CM | POA: Diagnosis not present

## 2023-04-08 DIAGNOSIS — H5703 Miosis: Secondary | ICD-10-CM | POA: Diagnosis not present

## 2023-04-12 ENCOUNTER — Other Ambulatory Visit: Payer: Self-pay | Admitting: Nurse Practitioner

## 2023-04-12 DIAGNOSIS — E118 Type 2 diabetes mellitus with unspecified complications: Secondary | ICD-10-CM

## 2023-04-12 DIAGNOSIS — N400 Enlarged prostate without lower urinary tract symptoms: Secondary | ICD-10-CM | POA: Diagnosis not present

## 2023-04-17 ENCOUNTER — Other Ambulatory Visit: Payer: Self-pay | Admitting: Nurse Practitioner

## 2023-04-19 ENCOUNTER — Telehealth: Payer: Self-pay | Admitting: Nurse Practitioner

## 2023-04-19 DIAGNOSIS — N400 Enlarged prostate without lower urinary tract symptoms: Secondary | ICD-10-CM | POA: Diagnosis not present

## 2023-04-19 DIAGNOSIS — R972 Elevated prostate specific antigen [PSA]: Secondary | ICD-10-CM | POA: Diagnosis not present

## 2023-04-19 MED ORDER — ISOSORBIDE MONONITRATE ER 30 MG PO TB24: 30.0000 mg | ORAL_TABLET | Freq: Every evening | ORAL | 1 refills | Status: DC

## 2023-04-19 MED ORDER — CARVEDILOL 6.25 MG PO TABS: 6.2500 mg | ORAL_TABLET | Freq: Two times a day (BID) | ORAL | 1 refills | Status: DC

## 2023-04-19 NOTE — Telephone Encounter (Signed)
Filled

## 2023-04-19 NOTE — Telephone Encounter (Signed)
*  STAT* If patient is at the pharmacy, call can be transferred to refill team.   1. Which medications need to be refilled? (please list name of each medication and dose if known) patient said new prescriptions were supposed to be called in on 04-01-23 for Carvedilol and Isosorbide   2. Would you like to learn more about the convenience, safety, & potential cost savings by using the [facility]?      3. Are you open to using the [facility] (Type [facility].    4. Which pharmacy/location (including street and city if local pharmacy) is medication to be sent to?  [facility] [place], [place]   5. Do they need a 30 day or 90 day supply?  90 days and refills- please call in today

## 2023-04-20 ENCOUNTER — Encounter: Payer: Self-pay | Admitting: Cardiology

## 2023-04-20 ENCOUNTER — Other Ambulatory Visit: Payer: Self-pay | Admitting: Nurse Practitioner

## 2023-04-20 ENCOUNTER — Telehealth: Payer: Self-pay | Admitting: Nurse Practitioner

## 2023-04-20 DIAGNOSIS — H2511 Age-related nuclear cataract, right eye: Secondary | ICD-10-CM | POA: Diagnosis not present

## 2023-04-20 MED ORDER — ISOSORBIDE MONONITRATE ER 30 MG PO TB24: 30.0000 mg | ORAL_TABLET | Freq: Every evening | ORAL | 1 refills | Status: DC

## 2023-04-20 NOTE — Telephone Encounter (Signed)
Refill re-sent  

## 2023-04-20 NOTE — Telephone Encounter (Signed)
Patient walked into the office stating that [name] [name] states that they didn't receive refill for sosorbide mononitrate (IMDUR) 30 MG 24 hr tablet  He took his last pill yesterday.

## 2023-04-21 ENCOUNTER — Telehealth: Payer: Self-pay | Admitting: Cardiology

## 2023-04-21 NOTE — Telephone Encounter (Signed)
Spoke with the pharmacy and they stated thatb they have receved patient carvedilol. I provided pharmacy a verbal order to fill the IMDUR for patient.  Called patient and informed him of this. Patient informed and verbalized understanding of plan.

## 2023-04-22 DIAGNOSIS — H5703 Miosis: Secondary | ICD-10-CM | POA: Diagnosis not present

## 2023-04-22 DIAGNOSIS — H2511 Age-related nuclear cataract, right eye: Secondary | ICD-10-CM | POA: Diagnosis not present

## 2023-04-29 ENCOUNTER — Other Ambulatory Visit: Payer: Self-pay | Admitting: Nurse Practitioner

## 2023-04-29 DIAGNOSIS — Z794 Long term (current) use of insulin: Secondary | ICD-10-CM

## 2023-04-29 MED ORDER — NOVOLOG FLEXPEN 100 UNIT/ML ~~LOC~~ SOPN: PEN_INJECTOR | SUBCUTANEOUS | 1 refills | Status: DC

## 2023-05-01 ENCOUNTER — Other Ambulatory Visit: Payer: Self-pay | Admitting: Nurse Practitioner

## 2023-05-17 ENCOUNTER — Other Ambulatory Visit: Payer: Self-pay | Admitting: Nurse Practitioner

## 2023-05-18 ENCOUNTER — Other Ambulatory Visit: Payer: Self-pay | Admitting: *Deleted

## 2023-05-18 DIAGNOSIS — E118 Type 2 diabetes mellitus with unspecified complications: Secondary | ICD-10-CM

## 2023-05-18 MED ORDER — NOVOLOG FLEXPEN 100 UNIT/ML ~~LOC~~ SOPN: PEN_INJECTOR | SUBCUTANEOUS | 1 refills | Status: DC

## 2023-05-25 ENCOUNTER — Other Ambulatory Visit: Payer: Self-pay | Admitting: Nurse Practitioner

## 2023-05-25 DIAGNOSIS — Z794 Long term (current) use of insulin: Secondary | ICD-10-CM

## 2023-05-29 DIAGNOSIS — R070 Pain in throat: Secondary | ICD-10-CM | POA: Diagnosis not present

## 2023-05-29 DIAGNOSIS — J0110 Acute frontal sinusitis, unspecified: Secondary | ICD-10-CM | POA: Diagnosis not present

## 2023-06-02 ENCOUNTER — Encounter: Payer: Self-pay | Admitting: Nurse Practitioner

## 2023-06-02 MED ORDER — CEFDINIR 300 MG PO CAPS: 300.0000 mg | ORAL_CAPSULE | Freq: Two times a day (BID) | ORAL | 0 refills | Status: DC

## 2023-06-13 ENCOUNTER — Other Ambulatory Visit: Payer: Self-pay | Admitting: Nurse Practitioner

## 2023-06-13 DIAGNOSIS — E118 Type 2 diabetes mellitus with unspecified complications: Secondary | ICD-10-CM

## 2023-06-14 ENCOUNTER — Other Ambulatory Visit: Payer: Self-pay | Admitting: Nurse Practitioner

## 2023-06-14 DIAGNOSIS — R531 Weakness: Secondary | ICD-10-CM

## 2023-06-14 NOTE — Progress Notes (Signed)
 Order for lift chair  [name] Daphine Deutscher, FNP

## 2023-06-22 ENCOUNTER — Other Ambulatory Visit: Payer: Self-pay | Admitting: Nurse Practitioner

## 2023-06-22 DIAGNOSIS — E118 Type 2 diabetes mellitus with unspecified complications: Secondary | ICD-10-CM

## 2023-06-29 ENCOUNTER — Telehealth: Payer: Self-pay | Admitting: Cardiology

## 2023-06-29 ENCOUNTER — Encounter: Payer: Self-pay | Admitting: *Deleted

## 2023-06-29 ENCOUNTER — Ambulatory Visit: Payer: Medicare PPO | Attending: Cardiology | Admitting: Cardiology

## 2023-06-29 ENCOUNTER — Encounter: Payer: Self-pay | Admitting: Cardiology

## 2023-06-29 VITALS — BP 148/82 | HR 92 | Ht 68.0 in | Wt 258.6 lb

## 2023-06-29 DIAGNOSIS — I10 Essential (primary) hypertension: Secondary | ICD-10-CM

## 2023-06-29 DIAGNOSIS — I25119 Atherosclerotic heart disease of native coronary artery with unspecified angina pectoris: Secondary | ICD-10-CM

## 2023-06-29 DIAGNOSIS — E782 Mixed hyperlipidemia: Secondary | ICD-10-CM | POA: Diagnosis not present

## 2023-06-29 MED ORDER — LISINOPRIL 5 MG PO TABS: 5.0000 mg | ORAL_TABLET | Freq: Every day | ORAL | 3 refills | Status: DC

## 2023-06-29 MED ORDER — NITROGLYCERIN 0.4 MG SL SUBL: 0.4000 mg | SUBLINGUAL_TABLET | SUBLINGUAL | 3 refills | Status: AC | PRN

## 2023-06-29 MED ORDER — ISOSORBIDE MONONITRATE ER 30 MG PO TB24: 30.0000 mg | ORAL_TABLET | Freq: Two times a day (BID) | ORAL | 1 refills | Status: DC

## 2023-06-29 NOTE — Telephone Encounter (Signed)
 Checking percert on the following patient for testing scheduled at [facility].    LEXISCAN   07/01/2023

## 2023-06-29 NOTE — Patient Instructions (Addendum)
 Medication Instructions:  Your physician has recommended you make the following change in your medication:  Increase lisinopril to 5 mg daily Change isosorbide mononitrate to 30 mg twice daily Continue all other medications as prescribed  Labwork: none  Testing/Procedures: Your physician has requested that you have a lexiscan myoview. For further information please visit [email or web address] Please follow instruction sheet, as given.  Follow-Up: Your physician recommends that you schedule a follow-up appointment in: pending  Any Other Special Instructions Will Be Listed Below (If Applicable).  If you need a refill on your cardiac medications before your next appointment, please call your pharmacy.

## 2023-06-29 NOTE — Progress Notes (Signed)
    Cardiology Office Note  Date: 06/29/2023   ID: Howell, [place]esbeck [birth year], MRN [id number]  History of Present Illness: [name] [name] is a 68 y.o. male last seen in January by Ms. Philis Nettle NP, reviewed the note.  He is here for a routine visit.  He reports exertional angina and dyspnea on exertion despite adjustments in medications made at last visit.  States that this limits his efforts to exercise.  He has gained some weight despite being on Ozempic and he reports compliance with his medications.  We went over his medications.  Discussed uptitrating Imdur to 30 mg twice daily and also increasing his lisinopril to 5 mg daily.  I rechecked his blood pressure today as noted below.  I asked him to get a new home blood pressure cuff to check measurements at home periodically.  We also discussed getting a follow-up Lexiscan Myoview to evaluate ischemic burden given the likelihood of graft disease.  Physical Exam: VS:  BP (!) 148/82 (BP Location: Left Arm)   Pulse 92   Ht 5\' 8"  (1.727 m)   Wt 258 lb 9.6 oz (117.3 kg)   SpO2 92%   BMI 39.32 kg/m , BMI Body mass index is 39.32 kg/m.  Wt Readings from Last 3 Encounters:  06/29/23 258 lb 9.6 oz (117.3 kg)  04/01/23 254 lb 6.4 oz (115.4 kg)  02/22/23 250 lb (113.4 kg)    General: Patient appears comfortable at rest. HEENT: Conjunctiva and lids normal. Neck: Supple, no elevated JVP or carotid bruits. Lungs: Clear to auscultation, nonlabored breathing at rest. Cardiac: Regular rate and rhythm, no S3, 1/6 systolic murmur, no pericardial rub.  ECG:  An ECG dated 04/01/2023 was personally reviewed today and demonstrated:  Sinus rhythm with nonspecific ST-T changes.  Labwork: 02/22/2023: ALT 39; AST 33; BUN 19; Creatinine, Ser 1.06; Hemoglobin 13.6; Platelets 294; Potassium 4.9; Sodium 139     Component Value Date/Time   CHOL 97 (L) 02/22/2023 1141   TRIG 219 (H) 02/22/2023 1141   HDL 22 (L) 02/22/2023 1141   CHOLHDL 4.4 02/22/2023 1141    CHOLHDL 3.7 08/16/2020 0319   VLDL 26 08/16/2020 0319   LDLCALC 40 02/22/2023 1141   Other Studies Reviewed Today:  No interval cardiac testing for review today.  Assessment and Plan:  1.  Multivessel CAD status post DES to the distal circumflex in 2005, DES to the LAD/diagonal bifurcation in 2012, DES to the ostial LAD and DES to the posterolateral in 2021, and ultimately CABG in May 2022 with LIMA to LAD, SVG to diagonal, and SVG to OM1 and OM 3.  LVEF 60 to 65% by echocardiogram in May 2022.  He reports exertional angina and dyspnea despite medication adjustments at last visit.  Increase Imdur to 30 mg twice daily, increase lisinopril to 5 mg daily.  Otherwise continue aspirin 81 mg daily, Lipitor 80 mg daily, and Coreg 6.25 mg twice daily.  Refill as needed nitroglycerin.  Scheduling a Lexiscan Myoview to evaluate ischemic burden.  Depending on how he does with symptom control on medications, may ultimately need to consider follow-up cardiac catheterization to assess for revascularization options.  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   2.  Primary hypertension.  Increase lisinopril to 5 mg daily.   3.  Mixed hyperlipidemia.  LDL 40 in November 2024.  Continue Lipitor 80 mg daily.  Disposition:  Follow up  test results.  Signed, Jonelle Sidle, M.D., F.A.C.C. [facility] at [facility]

## 2023-07-01 ENCOUNTER — Ambulatory Visit (HOSPITAL_COMMUNITY)
Admission: RE | Admit: 2023-07-01 | Discharge: 2023-07-01 | Disposition: A | Discharge status: Home / Self Care | Source: Ambulatory Visit | Attending: Cardiology | Admitting: Cardiology

## 2023-07-01 ENCOUNTER — Encounter (HOSPITAL_COMMUNITY): Payer: Self-pay

## 2023-07-01 DIAGNOSIS — I25119 Atherosclerotic heart disease of native coronary artery with unspecified angina pectoris: Secondary | ICD-10-CM

## 2023-07-01 LAB — NM MYOCAR MULTI W/SPECT W/WALL MOTION / EF
Estimated workload: 1
LV dias vol: 116 mL (ref 62–150)
LV sys vol: 66 mL
MPHR: 150 {beats}/min
Nuc Stress EF: 43 %
Peak HR: 103 {beats}/min
Percent HR: 68 %
RATE: 0.4
Rest HR: 82 {beats}/min
Rest Nuclear Isotope Dose: 11 mCi
SDS: 14
SRS: 4
SSS: 18
ST Depression (mm): 0 mm
Stress Nuclear Isotope Dose: 33 mCi
TID: 1.09

## 2023-07-01 MED ORDER — TECHNETIUM TC 99M TETROFOSMIN IV KIT
30.0000 | PACK | Freq: Once | INTRAVENOUS | Status: AC | PRN
  Administered 2023-07-01: 33 via INTRAVENOUS

## 2023-07-01 MED ORDER — SODIUM CHLORIDE FLUSH 0.9 % IV SOLN
INTRAVENOUS | Status: AC
  Administered 2023-07-01: 10 mL via INTRAVENOUS
  Filled 2023-07-01: qty 10

## 2023-07-01 MED ORDER — TECHNETIUM TC 99M TETROFOSMIN IV KIT
10.0000 | PACK | Freq: Once | INTRAVENOUS | Status: AC | PRN
  Administered 2023-07-01: 11 via INTRAVENOUS

## 2023-07-01 MED ORDER — REGADENOSON 0.4 MG/5ML IV SOLN
INTRAVENOUS | Status: AC
  Administered 2023-07-01: 0.4 mg via INTRAVENOUS
  Filled 2023-07-01: qty 5

## 2023-07-04 ENCOUNTER — Other Ambulatory Visit: Payer: Self-pay | Admitting: Nurse Practitioner

## 2023-07-04 DIAGNOSIS — E118 Type 2 diabetes mellitus with unspecified complications: Secondary | ICD-10-CM

## 2023-07-05 ENCOUNTER — Ambulatory Visit: Attending: Nurse Practitioner | Admitting: Nurse Practitioner

## 2023-07-05 ENCOUNTER — Encounter: Payer: Self-pay | Admitting: Nurse Practitioner

## 2023-07-05 ENCOUNTER — Other Ambulatory Visit: Payer: Self-pay | Admitting: Nurse Practitioner

## 2023-07-05 VITALS — BP 122/70 | HR 89 | Ht 68.0 in | Wt 259.0 lb

## 2023-07-05 DIAGNOSIS — I10 Essential (primary) hypertension: Secondary | ICD-10-CM | POA: Diagnosis not present

## 2023-07-05 DIAGNOSIS — E669 Obesity, unspecified: Secondary | ICD-10-CM | POA: Diagnosis not present

## 2023-07-05 DIAGNOSIS — Z951 Presence of aortocoronary bypass graft: Secondary | ICD-10-CM | POA: Diagnosis not present

## 2023-07-05 DIAGNOSIS — R0609 Other forms of dyspnea: Secondary | ICD-10-CM

## 2023-07-05 DIAGNOSIS — R079 Chest pain, unspecified: Secondary | ICD-10-CM

## 2023-07-05 DIAGNOSIS — E782 Mixed hyperlipidemia: Secondary | ICD-10-CM

## 2023-07-05 DIAGNOSIS — I25118 Atherosclerotic heart disease of native coronary artery with other forms of angina pectoris: Secondary | ICD-10-CM

## 2023-07-05 DIAGNOSIS — I25119 Atherosclerotic heart disease of native coronary artery with unspecified angina pectoris: Secondary | ICD-10-CM

## 2023-07-05 NOTE — Patient Instructions (Signed)
 Medication Instructions:  Your physician recommends that you continue on your current medications as directed. Please refer to the Current Medication list given to you today.  Labwork: In 1 week at [facility]   Testing/Procedures: None   Follow-Up: Your physician recommends that you schedule a follow-up appointment in: 4 weeks   Any Other Special Instructions Will Be Listed Below (If Applicable).  If you need a refill on your cardiac medications before your next appointment, please call your pharmacy.

## 2023-07-05 NOTE — Progress Notes (Addendum)
 Cardiology Office Note:  .   Date: 07/05/2023 ID:  [name] [name], DOB 28-Dec-1955, MRN [id number] PCP: Delfina Feller, FNP  [facility] Providers Cardiologist:  Teddie Favre, MD    History of Present Illness: .   [name] [name] is a 68 y.o. male with a PMH of CAD, s/p CABG in 2022, mixed HLD, HTN, T2DM, and obesity, who presents today for scheduled follow-up.  Previous cardiovascular history of drug-eluting stent to the distal circumflex in 2005.  In 2012, he received drug-eluting stent to the LAD/diagonal bifurcation.  In 2021 he received drug-eluting stent to the ostial LAD and drug-eluting stent to the posterolateral in 2021, ultimately underwent CABG in May 2022 with SVG-diagonal, LIMA-LAD, SVG-OM1 and OM 3.  EF at that time was 60-65%.  04/01/2023 - Today he presents for follow-up. Admits to chest pain ever since his surgery that is noticed during cold/ rainy/wet weather. Doesn't notice this in the summer months. States it occurs across his chest, describes pain as a burning sensation or a "hot poker" is in his chest. Denies any shortness of breath, palpitations, syncope, presyncope, dizziness, orthopnea, PND, swelling or significant weight changes, acute bleeding, or claudication.  Seen by Dr. Londa Rival on June 29, 2023.  Patient continued to note exertional angina and DOE despite medication adjustments from previous office visit. Due to symptoms, he said it limited his ability to exercise. Lexiscan Myoview was arranged - see report below.   07/05/2023 - Presents today for follow-up.  Says he has not noticed any improvement/worsening of his symptoms since I last saw him. Denies any palpitations, syncope, presyncope, dizziness, orthopnea, PND, swelling or significant weight changes, acute bleeding, or claudication.  ROS: Negative. See HPI.   Studies Reviewed: Aaron Aas    EKG:  EKG Interpretation Date/Time:  Monday July 05 2023 09:31:31 EDT Ventricular Rate:  84 PR  Interval:  200 QRS Duration:  80 QT Interval:  376 QTC Calculation: 444 R Axis:   109  Text Interpretation: Normal sinus rhythm Rightward axis ST & T wave abnormality, consider lateral ischemia When compared with ECG of 01-Apr-2023 09:32, No significant change was found Confirmed by Lasalle Pointer [phone]) on 07/05/2023 9:41:47 AM   Lexiscan Myoview 06/2023:   Findings are consistent with large area of  ischemia in both the lateral and inferior/inferoapical walls. The study is high risk.   No ST deviation was noted.   LV perfusion is abnormal. Large moderate intensity lateral defect nearly completely reversible. Large moderate intensity inferior/infero apical defect nearly completely reversible. SSS 18 SRS 4 SDS 14.   Left ventricular function is abnormal. Nuclear stress EF: 43%. The left ventricular ejection fraction is moderately decreased (30-44%). End diastolic cavity size is normal.  Echo intraoperative TEE 07/2020:  POST-OP IMPRESSIONS  - Left Ventricle: The left ventricle is unchanged from pre-bypass.  - Right Ventricle: The right ventricle appears unchanged from pre-bypass.  - Aorta: The aorta appears unchanged from pre-bypass.  - Left Atrium: The left atrium appears unchanged from pre-bypass.  - Left Atrial Appendage: The left atrial appendage appears unchanged from  pre-bypass.  - Aortic Valve: The aortic valve appears unchanged from pre-bypass.  - Mitral Valve: The mitral valve appears unchanged from pre-bypass.  - Tricuspid Valve: The tricuspid valve appears unchanged from pre-bypass.  - Pulmonic Valve: The pulmonic valve appears unchanged from pre-bypass.  - Interatrial Septum: The interatrial septum appears unchanged from  pre-bypass.  - Interventricular Septum: The interventricular septum appears unchanged  from  pre-bypass.  -  Pericardium: The pericardium appears unchanged from pre-bypass.  Vascular doppler pre-CABG 07/2020:  Summary:  Right Carotid: Velocities in the  right ICA are consistent with a 1-39%  stenosis.   Left Carotid: Velocities in the left ICA are consistent with a 1-39%  stenosis.  Vertebrals: Bilateral vertebral arteries demonstrate antegrade flow.   Right Upper Extremity: Doppler waveforms remain within normal limits with  right radial compression. Doppler waveforms remain within normal limits  with right ulnar compression.  Left Upper Extremity: Doppler waveforms remain within normal limits with left radial compression. Doppler waveforms decrease 50% with left ulnar compression.    Echo 07/2020:  1. Left ventricular ejection fraction, by estimation, is 60 to 65%. The  left ventricle has normal function. Left ventricular endocardial border  not optimally defined to evaluate regional wall motion. Grossly, no wall  motion abnormalities seen. There is  mild concentric left ventricular hypertrophy. Left ventricular diastolic  parameters are consistent with Grade I diastolic dysfunction (impaired  relaxation). Elevated left atrial pressure.   2. Right ventricular systolic function is normal. The right ventricular  size is normal. Tricuspid regurgitation signal is inadequate for assessing  PA pressure.   3. The mitral valve is normal in structure. No evidence of mitral valve  regurgitation.   4. The aortic valve is tricuspid. There is mild calcification of the  aortic valve. Aortic valve regurgitation is not visualized. Mild aortic  valve sclerosis is present, with no evidence of aortic valve stenosis.   Comparison(s): No significant change from prior study. Prior echo in  01/2020 demonstrated mild ascending aorta dilation (40mm) which was not  well visualized on current study. Will need continued monitoring as  outpatient.  LHC 07/2020:  Prox RCA lesion is 70% stenosed. LPAV lesion is 100% stenosed. Ost LAD to Prox LAD lesion is 95% stenosed. Mid LAD lesion is 80% stenosed. 2nd Mrg lesion is 90% stenosed. Previously placed 1st  LPL stent (unknown type) is widely patent.  LHC 04/2020:  Prox RCA lesion is 70% stenosed. Dist LAD lesion is 60% stenosed. 1st Mrg lesion is 85% stenosed. 1st LPL-2 lesion is 80% stenosed. Previously placed 1st LPL-1 drug eluting stent is widely patent. LPAV-2 lesion is 100% stenosed. Dist LM to Prox LAD lesion is 90% stenosed, instent restenosis. Treated with 3.25 mm [place] and then 4.0 [place] balloon. Dissection noted by OCT past the edge of old stent. A drug-eluting stent was successfully placed using a SYNERGY XD 3.50X12., post dilated to 4.0 mm. Post intervention, there is a 0% residual stenosis. Stent optimized with OCT. Mid LAD-1 lesion is 20% stenosed. Mid LAD-2 lesion is 75% stenosed. In-stent restenosis. Scoring balloon angioplasty was performed using a BALLOON WOLVERINE 3.25X10. Post intervention, there is a 0% residual stenosis. The left ventricular systolic function is normal. LV end diastolic pressure is normal. The left ventricular ejection fraction is 55-65% by visual estimate. There is no aortic valve stenosis. A drug-eluting stent was successfully placed using a SYNERGY XD 3.50X12.   Continue dual antiplatelet therapy along with aggressive secondary prevention.    Diffuse, distal and small vessel disease in the LAD and OM territories.  Aggressive medical therapy.   Coronary atherectomy, LHC 01/2020: Prox RCA lesion is 70% stenosed. Dist LM to Prox LAD lesion is 95% stenosed. Dist LAD lesion is 60% stenosed. Mid LAD lesion is 20% stenosed. Prox LAD to Mid LAD lesion is 20% stenosed. 1st Mrg lesion is 85% stenosed. 1st LPL-1 lesion is 95% stenosed. 1st LPL-2 lesion is 80%  stenosed. LPAV-1 lesion is 30% stenosed. LPAV-2 lesion is 100% stenosed. Post intervention, there is a 0% residual stenosis. A drug-eluting stent was successfully placed using a SYNERGY XD 3.50X12. Post intervention, there is a 0% residual stenosis. A drug-eluting stent was successfully placed  using a SYNERGY XD 2.25X16.   1. Successful IVUS guided orbital atherectomy and drug eluting stent placement to the ostial LAD. 2.  Successful angioplasty and drug-eluting stent placement to first PL branch of the left circumflex.     Recommendations: Continue indefinite dual antiplatelet therapy. Aggressive treatment of risk factors.   LHC 01/2020: Ostial LAD lesion is 95% stenosed. (Image does not allow drawing ostial LAD lesions) Prox LAD to Mid LAD lesion is 20% stenosed. Mid LAD STENT is 20% stenosed Dist LAD lesion is 60% stenosed. Very small caliber 1st Mrg lesion is 85% stenosed. No change from 2012 LPAV-1 lesion is 30% stenosed just prior to giving off LPL 1. LPAV-2 STENT is 100% stenosed -which leaves the L PDA occluded 1st LPL-1 lesion is 95% stenosed. 1st LPL-2 lesion is 80% stenosed. Prox RCA lesion is 70% stenosed. ---------------------------- The left ventricular ejection fraction is 35-45% by visual estimate. There is moderate left ventricular systolic dysfunction. LV end diastolic pressure is normal.   SUMMARY Severe multivessel CAD:  Heavily calcified eccentric 95% ostial LAD with mild in-stent restenosis of proximal-mid LAD stent and downstream diffuse mild to moderate disease with a focal 60% lesion;  small caliber ramus intermedius mild diffuse disease, heavily diseased OM1 (no change from 2012).   Very patulous dilated dominant LCx: 100% CTO of previously placed Stent leading from the AV groove circumflex to the PDA (no collateral)  95% mid LPL stenosis.  Nondominant RCA with proximal 70%. At least moderately reduced LVEF estimated roughly 45% with apical anterior hypokinesis. Severely elevated LVEDP - 28 mm.     RECOMMENDATIONS Very difficult situation with significant disease.  Need to review with heart team approach discussed between CVTS surgeons and other IC physicians.  Brief discussion with Dr. Nicanor Barge would indicate that only the distal LAD would be  a target for LIMA.  Dr. Alvenia Aus feels that the ostial LAD could be treated with atherectomy and stenting as could the LPL lesion.  Perhaps this would be a better option, however we need to assess EF. Anticipate full CVTS consultation note tomorrow.  We will hold off on stopping Effient until the decision is made about CABG versus PCI If CABG: Would not be until Wednesday 11/17 If PCI: Would wait until Friday 11/12 with Dr. Alvenia Aus -> depending on EF, may or may not need to consider Impella support With unstable angina presentation-chest pain and dyspnea with minimal exertion, I do not feel comfortable discharging this gentleman till a full plan is in place.  We will admit him to telemetry on the Interventional Team Service to allow time for HEART TEAM DISCUSSION for best option going forward. Check 2D echocardiogram The patient has a relatively difficult diabetes regimen, will start with a Lantus (at the lower end of his range) with mealtime and sliding scale coverage.  Diabetes education team to assist since he has a [employer]' monitor. Continue to titrate antianginals per IC Team (Team C)   Physical Exam:   VS:  BP 122/70   Pulse 89   Ht 5\' 8"  (1.727 m)   Wt 259 lb (117.5 kg)   SpO2 98%   BMI 39.38 kg/m    Wt Readings from Last 3 Encounters:  07/05/23 259  lb (117.5 kg)  06/29/23 258 lb 9.6 oz (117.3 kg)  04/01/23 254 lb 6.4 oz (115.4 kg)    GEN: Obese, 68 y.o. male in no acute distress NECK: No JVD; No carotid bruits CARDIAC: S1/S2, RRR, no murmurs, rubs, gallops RESPIRATORY:  Clear to auscultation without rales, wheezing or rhonchi  ABDOMEN: Soft, non-tender, non-distended EXTREMITIES:  No edema; No deformity   ASSESSMENT AND PLAN: .    CAD, s/p CABG in 2022, exertional chest pain, DOE Admits to stable, chronic chest pain noticed with exertion along with DOE. See Lexiscan results noted above, study noted to be high risk.  EKG today shows normal sinus rhythm with ST/T wave  abnormality.  Discussed treatment options including medication adjustments, however patient declines.  Politely requests to proceed with cardiac catheterization for definitive evaluation of his symptoms.  Will route note to his cardiologist, Dr. Londa Rival, to consider pursuing right and left heart cath due to his DOE and exertional angina.  Discussed risk versus benefits of cardiac catheterization, and he verbalized understanding and is agreeable to proceed if given okay by attending cardiologist.  Continue aspirin, atorvastatin, carvedilol, Imdur, lisinopril, and nitroglycerin as needed.  Heart healthy diet and regular cardiovascular exercise encouraged.  Care and ED precautions discussed.  Will obtain CBC, CMET, proBNP for further evaluation.  Addendum 07/06/2023 - Case d/w Dr. Londa Rival via Epic who agreed to arrange right and left heart cath based on his high risk Lexiscan and no improvement in his symptoms. Will update patient and place pre-procedure orders once procedure has been scheduled.   Addendum 07/13/2023: Pre-procedure labs are under signed and held.   Informed Consent   Shared Decision Making/Informed Consent The risks [stroke (1 in 1000), death (1 in 1000), kidney failure [usually temporary] (1 in 500), bleeding (1 in 200), allergic reaction [possibly serious] (1 in 200)], benefits (diagnostic support and management of coronary artery disease) and alternatives of a cardiac catheterization were discussed in detail with Mr. Goodson and he is willing to proceed.   Mixed HLD LDL 40 in November 2024.  Triglycerides 219.  Patient is requesting to work on lifestyle changes first.  Mediterranean diet and regular cardiovascular exercise encouraged.  Continue current medication regimen. Continue to follow with PCP.  HTN Blood pressure stable. Discussed to monitor BP at home at least 2 hours after medications and sitting for 5-10 minutes.  Continue current medication regimen.  Heart healthy diet and  regular cardiovascular exercise encouraged.  Obesity Weight loss via diet and exercise encouraged. Discussed the impact being overweight would have on cardiovascular risk.   Dispo: Follow-up with me/APP in 4 weeks or sooner if anything changes.  Signed, Lasalle Pointer, NP

## 2023-07-05 NOTE — H&P (View-Only) (Signed)
 Cardiology Office Note:  .   Date: 07/05/2023 ID:  [name] [name], DOB 28-Dec-1955, MRN [id number] PCP: Delfina Feller, FNP  [facility] Providers Cardiologist:  Teddie Favre, MD    History of Present Illness: .   [name] [name] is a 68 y.o. male with a PMH of CAD, s/p CABG in 2022, mixed HLD, HTN, T2DM, and obesity, who presents today for scheduled follow-up.  Previous cardiovascular history of drug-eluting stent to the distal circumflex in 2005.  In 2012, he received drug-eluting stent to the LAD/diagonal bifurcation.  In 2021 he received drug-eluting stent to the ostial LAD and drug-eluting stent to the posterolateral in 2021, ultimately underwent CABG in May 2022 with SVG-diagonal, LIMA-LAD, SVG-OM1 and OM 3.  EF at that time was 60-65%.  04/01/2023 - Today he presents for follow-up. Admits to chest pain ever since his surgery that is noticed during cold/ rainy/wet weather. Doesn't notice this in the summer months. States it occurs across his chest, describes pain as a burning sensation or a "hot poker" is in his chest. Denies any shortness of breath, palpitations, syncope, presyncope, dizziness, orthopnea, PND, swelling or significant weight changes, acute bleeding, or claudication.  Seen by Dr. Londa Rival on June 29, 2023.  Patient continued to note exertional angina and DOE despite medication adjustments from previous office visit. Due to symptoms, he said it limited his ability to exercise. Lexiscan Myoview was arranged - see report below.   07/05/2023 - Presents today for follow-up.  Says he has not noticed any improvement/worsening of his symptoms since I last saw him. Denies any palpitations, syncope, presyncope, dizziness, orthopnea, PND, swelling or significant weight changes, acute bleeding, or claudication.  ROS: Negative. See HPI.   Studies Reviewed: Aaron Aas    EKG:  EKG Interpretation Date/Time:  Monday July 05 2023 09:31:31 EDT Ventricular Rate:  84 PR  Interval:  200 QRS Duration:  80 QT Interval:  376 QTC Calculation: 444 R Axis:   109  Text Interpretation: Normal sinus rhythm Rightward axis ST & T wave abnormality, consider lateral ischemia When compared with ECG of 01-Apr-2023 09:32, No significant change was found Confirmed by Lasalle Pointer [phone]) on 07/05/2023 9:41:47 AM   Lexiscan Myoview 06/2023:   Findings are consistent with large area of  ischemia in both the lateral and inferior/inferoapical walls. The study is high risk.   No ST deviation was noted.   LV perfusion is abnormal. Large moderate intensity lateral defect nearly completely reversible. Large moderate intensity inferior/infero apical defect nearly completely reversible. SSS 18 SRS 4 SDS 14.   Left ventricular function is abnormal. Nuclear stress EF: 43%. The left ventricular ejection fraction is moderately decreased (30-44%). End diastolic cavity size is normal.  Echo intraoperative TEE 07/2020:  POST-OP IMPRESSIONS  - Left Ventricle: The left ventricle is unchanged from pre-bypass.  - Right Ventricle: The right ventricle appears unchanged from pre-bypass.  - Aorta: The aorta appears unchanged from pre-bypass.  - Left Atrium: The left atrium appears unchanged from pre-bypass.  - Left Atrial Appendage: The left atrial appendage appears unchanged from  pre-bypass.  - Aortic Valve: The aortic valve appears unchanged from pre-bypass.  - Mitral Valve: The mitral valve appears unchanged from pre-bypass.  - Tricuspid Valve: The tricuspid valve appears unchanged from pre-bypass.  - Pulmonic Valve: The pulmonic valve appears unchanged from pre-bypass.  - Interatrial Septum: The interatrial septum appears unchanged from  pre-bypass.  - Interventricular Septum: The interventricular septum appears unchanged  from  pre-bypass.  -  Pericardium: The pericardium appears unchanged from pre-bypass.  Vascular doppler pre-CABG 07/2020:  Summary:  Right Carotid: Velocities in the  right ICA are consistent with a 1-39%  stenosis.   Left Carotid: Velocities in the left ICA are consistent with a 1-39%  stenosis.  Vertebrals: Bilateral vertebral arteries demonstrate antegrade flow.   Right Upper Extremity: Doppler waveforms remain within normal limits with  right radial compression. Doppler waveforms remain within normal limits  with right ulnar compression.  Left Upper Extremity: Doppler waveforms remain within normal limits with left radial compression. Doppler waveforms decrease 50% with left ulnar compression.    Echo 07/2020:  1. Left ventricular ejection fraction, by estimation, is 60 to 65%. The  left ventricle has normal function. Left ventricular endocardial border  not optimally defined to evaluate regional wall motion. Grossly, no wall  motion abnormalities seen. There is  mild concentric left ventricular hypertrophy. Left ventricular diastolic  parameters are consistent with Grade I diastolic dysfunction (impaired  relaxation). Elevated left atrial pressure.   2. Right ventricular systolic function is normal. The right ventricular  size is normal. Tricuspid regurgitation signal is inadequate for assessing  PA pressure.   3. The mitral valve is normal in structure. No evidence of mitral valve  regurgitation.   4. The aortic valve is tricuspid. There is mild calcification of the  aortic valve. Aortic valve regurgitation is not visualized. Mild aortic  valve sclerosis is present, with no evidence of aortic valve stenosis.   Comparison(s): No significant change from prior study. Prior echo in  01/2020 demonstrated mild ascending aorta dilation (40mm) which was not  well visualized on current study. Will need continued monitoring as  outpatient.  LHC 07/2020:  Prox RCA lesion is 70% stenosed. LPAV lesion is 100% stenosed. Ost LAD to Prox LAD lesion is 95% stenosed. Mid LAD lesion is 80% stenosed. 2nd Mrg lesion is 90% stenosed. Previously placed 1st  LPL stent (unknown type) is widely patent.  LHC 04/2020:  Prox RCA lesion is 70% stenosed. Dist LAD lesion is 60% stenosed. 1st Mrg lesion is 85% stenosed. 1st LPL-2 lesion is 80% stenosed. Previously placed 1st LPL-1 drug eluting stent is widely patent. LPAV-2 lesion is 100% stenosed. Dist LM to Prox LAD lesion is 90% stenosed, instent restenosis. Treated with 3.25 mm [place] and then 4.0 [place] balloon. Dissection noted by OCT past the edge of old stent. A drug-eluting stent was successfully placed using a SYNERGY XD 3.50X12., post dilated to 4.0 mm. Post intervention, there is a 0% residual stenosis. Stent optimized with OCT. Mid LAD-1 lesion is 20% stenosed. Mid LAD-2 lesion is 75% stenosed. In-stent restenosis. Scoring balloon angioplasty was performed using a BALLOON WOLVERINE 3.25X10. Post intervention, there is a 0% residual stenosis. The left ventricular systolic function is normal. LV end diastolic pressure is normal. The left ventricular ejection fraction is 55-65% by visual estimate. There is no aortic valve stenosis. A drug-eluting stent was successfully placed using a SYNERGY XD 3.50X12.   Continue dual antiplatelet therapy along with aggressive secondary prevention.    Diffuse, distal and small vessel disease in the LAD and OM territories.  Aggressive medical therapy.   Coronary atherectomy, LHC 01/2020: Prox RCA lesion is 70% stenosed. Dist LM to Prox LAD lesion is 95% stenosed. Dist LAD lesion is 60% stenosed. Mid LAD lesion is 20% stenosed. Prox LAD to Mid LAD lesion is 20% stenosed. 1st Mrg lesion is 85% stenosed. 1st LPL-1 lesion is 95% stenosed. 1st LPL-2 lesion is 80%  stenosed. LPAV-1 lesion is 30% stenosed. LPAV-2 lesion is 100% stenosed. Post intervention, there is a 0% residual stenosis. A drug-eluting stent was successfully placed using a SYNERGY XD 3.50X12. Post intervention, there is a 0% residual stenosis. A drug-eluting stent was successfully placed  using a SYNERGY XD 2.25X16.   1. Successful IVUS guided orbital atherectomy and drug eluting stent placement to the ostial LAD. 2.  Successful angioplasty and drug-eluting stent placement to first PL branch of the left circumflex.     Recommendations: Continue indefinite dual antiplatelet therapy. Aggressive treatment of risk factors.   LHC 01/2020: Ostial LAD lesion is 95% stenosed. (Image does not allow drawing ostial LAD lesions) Prox LAD to Mid LAD lesion is 20% stenosed. Mid LAD STENT is 20% stenosed Dist LAD lesion is 60% stenosed. Very small caliber 1st Mrg lesion is 85% stenosed. No change from 2012 LPAV-1 lesion is 30% stenosed just prior to giving off LPL 1. LPAV-2 STENT is 100% stenosed -which leaves the L PDA occluded 1st LPL-1 lesion is 95% stenosed. 1st LPL-2 lesion is 80% stenosed. Prox RCA lesion is 70% stenosed. ---------------------------- The left ventricular ejection fraction is 35-45% by visual estimate. There is moderate left ventricular systolic dysfunction. LV end diastolic pressure is normal.   SUMMARY Severe multivessel CAD:  Heavily calcified eccentric 95% ostial LAD with mild in-stent restenosis of proximal-mid LAD stent and downstream diffuse mild to moderate disease with a focal 60% lesion;  small caliber ramus intermedius mild diffuse disease, heavily diseased OM1 (no change from 2012).   Very patulous dilated dominant LCx: 100% CTO of previously placed Stent leading from the AV groove circumflex to the PDA (no collateral)  95% mid LPL stenosis.  Nondominant RCA with proximal 70%. At least moderately reduced LVEF estimated roughly 45% with apical anterior hypokinesis. Severely elevated LVEDP - 28 mm.     RECOMMENDATIONS Very difficult situation with significant disease.  Need to review with heart team approach discussed between CVTS surgeons and other IC physicians.  Brief discussion with Dr. Nicanor Barge would indicate that only the distal LAD would be  a target for LIMA.  Dr. Alvenia Aus feels that the ostial LAD could be treated with atherectomy and stenting as could the LPL lesion.  Perhaps this would be a better option, however we need to assess EF. Anticipate full CVTS consultation note tomorrow.  We will hold off on stopping Effient until the decision is made about CABG versus PCI If CABG: Would not be until Wednesday 11/17 If PCI: Would wait until Friday 11/12 with Dr. Alvenia Aus -> depending on EF, may or may not need to consider Impella support With unstable angina presentation-chest pain and dyspnea with minimal exertion, I do not feel comfortable discharging this gentleman till a full plan is in place.  We will admit him to telemetry on the Interventional Team Service to allow time for HEART TEAM DISCUSSION for best option going forward. Check 2D echocardiogram The patient has a relatively difficult diabetes regimen, will start with a Lantus (at the lower end of his range) with mealtime and sliding scale coverage.  Diabetes education team to assist since he has a [employer]' monitor. Continue to titrate antianginals per IC Team (Team C)   Physical Exam:   VS:  BP 122/70   Pulse 89   Ht 5\' 8"  (1.727 m)   Wt 259 lb (117.5 kg)   SpO2 98%   BMI 39.38 kg/m    Wt Readings from Last 3 Encounters:  07/05/23 259  lb (117.5 kg)  06/29/23 258 lb 9.6 oz (117.3 kg)  04/01/23 254 lb 6.4 oz (115.4 kg)    GEN: Obese, 68 y.o. male in no acute distress NECK: No JVD; No carotid bruits CARDIAC: S1/S2, RRR, no murmurs, rubs, gallops RESPIRATORY:  Clear to auscultation without rales, wheezing or rhonchi  ABDOMEN: Soft, non-tender, non-distended EXTREMITIES:  No edema; No deformity   ASSESSMENT AND PLAN: .    CAD, s/p CABG in 2022, exertional chest pain, DOE Admits to stable, chronic chest pain noticed with exertion along with DOE. See Lexiscan results noted above, study noted to be high risk.  EKG today shows normal sinus rhythm with ST/T wave  abnormality.  Discussed treatment options including medication adjustments, however patient declines.  Politely requests to proceed with cardiac catheterization for definitive evaluation of his symptoms.  Will route note to his cardiologist, Dr. Londa Rival, to consider pursuing right and left heart cath due to his DOE and exertional angina.  Discussed risk versus benefits of cardiac catheterization, and he verbalized understanding and is agreeable to proceed if given okay by attending cardiologist.  Continue aspirin, atorvastatin, carvedilol, Imdur, lisinopril, and nitroglycerin as needed.  Heart healthy diet and regular cardiovascular exercise encouraged.  Care and ED precautions discussed.  Will obtain CBC, CMET, proBNP for further evaluation.  Addendum 07/06/2023 - Case d/w Dr. Londa Rival via Epic who agreed to arrange right and left heart cath based on his high risk Lexiscan and no improvement in his symptoms. Will update patient and place pre-procedure orders once procedure has been scheduled.   Addendum 07/13/2023: Pre-procedure labs are under signed and held.   Informed Consent   Shared Decision Making/Informed Consent The risks [stroke (1 in 1000), death (1 in 1000), kidney failure [usually temporary] (1 in 500), bleeding (1 in 200), allergic reaction [possibly serious] (1 in 200)], benefits (diagnostic support and management of coronary artery disease) and alternatives of a cardiac catheterization were discussed in detail with [name] [name] and he is willing to proceed.   Mixed HLD LDL 40 in November 2024.  Triglycerides 219.  Patient is requesting to work on lifestyle changes first.  Mediterranean diet and regular cardiovascular exercise encouraged.  Continue current medication regimen. Continue to follow with PCP.  HTN Blood pressure stable. Discussed to monitor BP at home at least 2 hours after medications and sitting for 5-10 minutes.  Continue current medication regimen.  Heart healthy diet and  regular cardiovascular exercise encouraged.  Obesity Weight loss via diet and exercise encouraged. Discussed the impact being overweight would have on cardiovascular risk.   Dispo: Follow-up with me/APP in 4 weeks or sooner if anything changes.  Signed, Lasalle Pointer, NP

## 2023-07-06 ENCOUNTER — Telehealth: Payer: Self-pay | Admitting: Nurse Practitioner

## 2023-07-06 ENCOUNTER — Encounter: Payer: Self-pay | Admitting: Nurse Practitioner

## 2023-07-06 NOTE — Telephone Encounter (Signed)
 Patient informed and verbalized understanding of plan. Patient took his ozempic this past Sunday

## 2023-07-06 NOTE — Telephone Encounter (Signed)
-----   Message from Sharlene Dory sent at 07/06/2023  4:14 PM EDT ----- Regarding: Arrange right and left heart cath for patient Hello Tori,   I spoke with Dr. Diona Browner about this patient. He said okay to arrange right and left heart cath due to patient experiencing exertional angina and dyspnea on exertion. Please update patient and let him know. He should be aware, as we discussed this yesterday in the office.  He gave me consent regarding this procedure.  When did he last take his Ozempic? Will need to know that prior to scheduling the cath.   Thanks!   Best, Sharlene Dory, NP ----- Message ----- From: Jonelle Sidle, MD Sent: 07/06/2023   3:37 PM EDT To: Sharlene Dory, NP  Agree with cardiac catheterization in light of symptoms and high risk Myoview results. ----- Message ----- From: Sharlene Dory, NP Sent: 07/06/2023   3:34 PM EDT To: Jonelle Sidle, MD  Went over results of his nuclear stress test, findings were considered to be high risk.  Patient is requesting to do heart catheterization.  Did discuss a right and left heart cath with him, and he gave me informed consent.  Wanted to see if you are okay to arrange right and left heart cath due to his symptoms (exertional angina and DOE).   Thanks!   Best, Sharlene Dory, NP

## 2023-07-06 NOTE — Telephone Encounter (Signed)
 Checking percert on the following patient for   07/14/23 with Dr.Harding Arrive at 6:30 CATH at 8:30

## 2023-07-06 NOTE — Telephone Encounter (Signed)
 Patient having labs done this week cath scheduled for 07/14/23 with Dr.Harding at 8:30 arrive at 6:30  Patient instructions reviewed with patient and MyChart message sent to patient.

## 2023-07-07 DIAGNOSIS — R0609 Other forms of dyspnea: Secondary | ICD-10-CM | POA: Diagnosis not present

## 2023-07-07 DIAGNOSIS — Z951 Presence of aortocoronary bypass graft: Secondary | ICD-10-CM | POA: Diagnosis not present

## 2023-07-07 DIAGNOSIS — I10 Essential (primary) hypertension: Secondary | ICD-10-CM | POA: Diagnosis not present

## 2023-07-07 DIAGNOSIS — I25119 Atherosclerotic heart disease of native coronary artery with unspecified angina pectoris: Secondary | ICD-10-CM | POA: Diagnosis not present

## 2023-07-08 LAB — CBC
Hematocrit: 39.5 % (ref 37.5–51.0)
Hemoglobin: 12.8 g/dL — ABNORMAL LOW (ref 13.0–17.7)
MCH: 27.6 pg (ref 26.6–33.0)
MCHC: 32.4 g/dL (ref 31.5–35.7)
MCV: 85 fL (ref 79–97)
Platelets: 260 10*3/uL (ref 150–450)
RBC: 4.63 x10E6/uL (ref 4.14–5.80)
RDW: 14.9 % (ref 11.6–15.4)
WBC: 6.1 10*3/uL (ref 3.4–10.8)

## 2023-07-08 LAB — COMPREHENSIVE METABOLIC PANEL WITH GFR
ALT: 33 IU/L (ref 0–44)
AST: 29 IU/L (ref 0–40)
Albumin: 4.2 g/dL (ref 3.9–4.9)
Alkaline Phosphatase: 56 IU/L (ref 44–121)
BUN/Creatinine Ratio: 22 (ref 10–24)
BUN: 21 mg/dL (ref 8–27)
Bilirubin Total: 0.5 mg/dL (ref 0.0–1.2)
CO2: 23 mmol/L (ref 20–29)
Calcium: 9.4 mg/dL (ref 8.6–10.2)
Chloride: 98 mmol/L (ref 96–106)
Creatinine, Ser: 0.97 mg/dL (ref 0.76–1.27)
Globulin, Total: 2.1 g/dL (ref 1.5–4.5)
Glucose: 94 mg/dL (ref 70–99)
Potassium: 4.8 mmol/L (ref 3.5–5.2)
Sodium: 140 mmol/L (ref 134–144)
Total Protein: 6.3 g/dL (ref 6.0–8.5)
eGFR: 84 mL/min/{1.73_m2} (ref >59)

## 2023-07-08 LAB — BRAIN NATRIURETIC PEPTIDE: BNP: 88.2 pg/mL (ref 0.0–100.0)

## 2023-07-12 ENCOUNTER — Telehealth: Payer: Self-pay | Admitting: *Deleted

## 2023-07-12 ENCOUNTER — Other Ambulatory Visit: Payer: Self-pay | Admitting: Nurse Practitioner

## 2023-07-12 DIAGNOSIS — Z794 Long term (current) use of insulin: Secondary | ICD-10-CM

## 2023-07-12 NOTE — Telephone Encounter (Signed)
 Cardiac Catheterization scheduled at [facility] for: Wednesday July 14, 2023 8:30 AM Arrival time [facility] Main Entrance A at: 6:30 AM  Nothing to eat after midnight prior to procedure, clear liquids until 5 AM day of procedure.  Medication instructions: -Hold:  Insulin-AM of procedure-1/2 usual dose HS prior to procedure   Metformin-day of procedure and 48 hours post procedure  Lasix-AM of procedure  Ozempic-weekly on Sundays-pt did not take 07/11/23 -Other usual morning medications can be taken with sips of water including aspirin 81 mg.  Plan to go home the same day, you will only stay overnight if medically necessary.  You must have responsible adult to drive you home.  Someone must be with you the first 24 hours after you arrive home.  Reviewed procedure instructions with patient.

## 2023-07-13 NOTE — Addendum Note (Signed)
 Addended by: Lasalle Pointer on: 07/13/2023 12:09 PM   Modules accepted: Orders

## 2023-07-14 ENCOUNTER — Other Ambulatory Visit (HOSPITAL_COMMUNITY): Payer: Self-pay

## 2023-07-14 ENCOUNTER — Observation Stay (HOSPITAL_COMMUNITY)
Admission: EM | Admit: 2023-07-14 | Discharge: 2023-07-15 | Disposition: A | Discharge status: Home / Self Care | Attending: Cardiology | Admitting: Cardiology

## 2023-07-14 ENCOUNTER — Ambulatory Visit (HOSPITAL_COMMUNITY)
Admission: RE | Admit: 2023-07-14 | Discharge: 2023-07-14 | Disposition: A | Discharge status: Home / Self Care | Source: Home / Self Care | Attending: Cardiology | Admitting: Cardiology

## 2023-07-14 ENCOUNTER — Other Ambulatory Visit: Payer: Self-pay

## 2023-07-14 ENCOUNTER — Encounter (HOSPITAL_COMMUNITY): Payer: Self-pay

## 2023-07-14 ENCOUNTER — Emergency Department (HOSPITAL_BASED_OUTPATIENT_CLINIC_OR_DEPARTMENT_OTHER)

## 2023-07-14 ENCOUNTER — Emergency Department (HOSPITAL_COMMUNITY)

## 2023-07-14 ENCOUNTER — Encounter (HOSPITAL_COMMUNITY)
Admission: RE | Disposition: A | Discharge status: Home / Self Care | Payer: Self-pay | Source: Home / Self Care | Attending: Cardiology

## 2023-07-14 DIAGNOSIS — R0602 Shortness of breath: Secondary | ICD-10-CM | POA: Diagnosis not present

## 2023-07-14 DIAGNOSIS — I517 Cardiomegaly: Secondary | ICD-10-CM | POA: Diagnosis not present

## 2023-07-14 DIAGNOSIS — I200 Unstable angina: Secondary | ICD-10-CM | POA: Diagnosis not present

## 2023-07-14 DIAGNOSIS — Z7982 Long term (current) use of aspirin: Secondary | ICD-10-CM | POA: Insufficient documentation

## 2023-07-14 DIAGNOSIS — I10 Essential (primary) hypertension: Secondary | ICD-10-CM | POA: Insufficient documentation

## 2023-07-14 DIAGNOSIS — Z794 Long term (current) use of insulin: Secondary | ICD-10-CM | POA: Diagnosis not present

## 2023-07-14 DIAGNOSIS — Z955 Presence of coronary angioplasty implant and graft: Secondary | ICD-10-CM | POA: Insufficient documentation

## 2023-07-14 DIAGNOSIS — Z79899 Other long term (current) drug therapy: Secondary | ICD-10-CM | POA: Insufficient documentation

## 2023-07-14 DIAGNOSIS — E782 Mixed hyperlipidemia: Secondary | ICD-10-CM | POA: Insufficient documentation

## 2023-07-14 DIAGNOSIS — E785 Hyperlipidemia, unspecified: Secondary | ICD-10-CM | POA: Insufficient documentation

## 2023-07-14 DIAGNOSIS — Z951 Presence of aortocoronary bypass graft: Secondary | ICD-10-CM | POA: Insufficient documentation

## 2023-07-14 DIAGNOSIS — E119 Type 2 diabetes mellitus without complications: Secondary | ICD-10-CM | POA: Insufficient documentation

## 2023-07-14 DIAGNOSIS — R079 Chest pain, unspecified: Principal | ICD-10-CM

## 2023-07-14 DIAGNOSIS — I2510 Atherosclerotic heart disease of native coronary artery without angina pectoris: Secondary | ICD-10-CM | POA: Diagnosis not present

## 2023-07-14 DIAGNOSIS — I25119 Atherosclerotic heart disease of native coronary artery with unspecified angina pectoris: Secondary | ICD-10-CM | POA: Insufficient documentation

## 2023-07-14 DIAGNOSIS — R0609 Other forms of dyspnea: Secondary | ICD-10-CM

## 2023-07-14 DIAGNOSIS — Z7984 Long term (current) use of oral hypoglycemic drugs: Secondary | ICD-10-CM | POA: Insufficient documentation

## 2023-07-14 DIAGNOSIS — R0600 Dyspnea, unspecified: Principal | ICD-10-CM | POA: Insufficient documentation

## 2023-07-14 DIAGNOSIS — Z6838 Body mass index (BMI) 38.0-38.9, adult: Secondary | ICD-10-CM | POA: Insufficient documentation

## 2023-07-14 DIAGNOSIS — E1169 Type 2 diabetes mellitus with other specified complication: Secondary | ICD-10-CM | POA: Diagnosis present

## 2023-07-14 DIAGNOSIS — R0789 Other chest pain: Secondary | ICD-10-CM | POA: Diagnosis not present

## 2023-07-14 DIAGNOSIS — E669 Obesity, unspecified: Secondary | ICD-10-CM | POA: Insufficient documentation

## 2023-07-14 DIAGNOSIS — I25118 Atherosclerotic heart disease of native coronary artery with other forms of angina pectoris: Secondary | ICD-10-CM

## 2023-07-14 HISTORY — PX: RIGHT/LEFT HEART CATH AND CORONARY/GRAFT ANGIOGRAPHY: CATH118267

## 2023-07-14 LAB — CBC
HCT: 41.1 % (ref 39.0–52.0)
Hemoglobin: 12.7 g/dL — ABNORMAL LOW (ref 13.0–17.0)
MCH: 27 pg (ref 26.0–34.0)
MCHC: 30.9 g/dL (ref 30.0–36.0)
MCV: 87.4 fL (ref 80.0–100.0)
Platelets: 242 10*3/uL (ref 150–400)
RBC: 4.7 MIL/uL (ref 4.22–5.81)
RDW: 15.3 % (ref 11.5–15.5)
WBC: 5.5 10*3/uL (ref 4.0–10.5)
nRBC: 0 % (ref 0.0–0.2)

## 2023-07-14 LAB — POCT I-STAT EG7
Acid-base deficit: 1 mmol/L (ref 0.0–2.0)
Bicarbonate: 26.1 mmol/L (ref 20.0–28.0)
Calcium, Ion: 1.15 mmol/L (ref 1.15–1.40)
HCT: 39 % (ref 39.0–52.0)
Hemoglobin: 13.3 g/dL (ref 13.0–17.0)
O2 Saturation: 62 %
Potassium: 4.6 mmol/L (ref 3.5–5.1)
Sodium: 136 mmol/L (ref 135–145)
TCO2: 28 mmol/L (ref 22–32)
pCO2, Ven: 50.1 mmHg (ref 44–60)
pH, Ven: 7.325 (ref 7.25–7.43)
pO2, Ven: 35 mmHg (ref 32–45)

## 2023-07-14 LAB — POCT I-STAT 7, (LYTES, BLD GAS, ICA,H+H)
Acid-Base Excess: 0 mmol/L (ref 0.0–2.0)
Bicarbonate: 25.6 mmol/L (ref 20.0–28.0)
Calcium, Ion: 1.17 mmol/L (ref 1.15–1.40)
HCT: 38 % — ABNORMAL LOW (ref 39.0–52.0)
Hemoglobin: 12.9 g/dL — ABNORMAL LOW (ref 13.0–17.0)
O2 Saturation: 93 %
Potassium: 4.7 mmol/L (ref 3.5–5.1)
Sodium: 138 mmol/L (ref 135–145)
TCO2: 27 mmol/L (ref 22–32)
pCO2 arterial: 42.5 mmHg (ref 32–48)
pH, Arterial: 7.388 (ref 7.35–7.45)
pO2, Arterial: 69 mmHg — ABNORMAL LOW (ref 83–108)

## 2023-07-14 LAB — GLUCOSE, CAPILLARY
Glucose-Capillary: 205 mg/dL — ABNORMAL HIGH (ref 70–99)
Glucose-Capillary: 186 mg/dL — ABNORMAL HIGH (ref 70–99)

## 2023-07-14 LAB — BASIC METABOLIC PANEL WITH GFR
Anion gap: 12 (ref 5–15)
BUN: 23 mg/dL (ref 8–23)
CO2: 24 mmol/L (ref 22–32)
Calcium: 9.1 mg/dL (ref 8.9–10.3)
Chloride: 98 mmol/L (ref 98–111)
Creatinine, Ser: 1.05 mg/dL (ref 0.61–1.24)
GFR, Estimated: >60 mL/min (ref >60)
Glucose, Bld: 344 mg/dL — ABNORMAL HIGH (ref 70–99)
Potassium: 4.5 mmol/L (ref 3.5–5.1)
Sodium: 134 mmol/L — ABNORMAL LOW (ref 135–145)

## 2023-07-14 LAB — TROPONIN I (HIGH SENSITIVITY)
Troponin I (High Sensitivity): 28 ng/L — ABNORMAL HIGH (ref <18)
Troponin I (High Sensitivity): 25 ng/L — ABNORMAL HIGH (ref <18)

## 2023-07-14 LAB — CBG MONITORING, ED
Glucose-Capillary: 224 mg/dL — ABNORMAL HIGH (ref 70–99)
Glucose-Capillary: 260 mg/dL — ABNORMAL HIGH (ref 70–99)

## 2023-07-14 LAB — ECHOCARDIOGRAM LIMITED
Height: 68 in
Weight: 4000.03 [oz_av]

## 2023-07-14 LAB — HEMOGLOBIN A1C
Hgb A1c MFr Bld: 7.3 % — ABNORMAL HIGH (ref 4.8–5.6)
Mean Plasma Glucose: 162.81 mg/dL

## 2023-07-14 SURGERY — RIGHT/LEFT HEART CATH AND CORONARY/GRAFT ANGIOGRAPHY
Anesthesia: LOCAL

## 2023-07-14 MED ORDER — ASPIRIN 81 MG PO CHEW: 81.0000 mg | CHEWABLE_TABLET | ORAL | Status: DC

## 2023-07-14 MED ORDER — SODIUM CHLORIDE 0.9 % IV SOLN: INTRAVENOUS | Status: DC

## 2023-07-14 MED ORDER — CARVEDILOL 3.125 MG PO TABS
6.2500 mg | ORAL_TABLET | Freq: Two times a day (BID) | ORAL | Status: DC
  Administered 2023-07-14 – 2023-07-15 (×2): 6.25 mg via ORAL
  Filled 2023-07-14 (×2): qty 2

## 2023-07-14 MED ORDER — FENTANYL CITRATE (PF) 100 MCG/2ML IJ SOLN
INTRAMUSCULAR | Status: AC
  Filled 2023-07-14: qty 2

## 2023-07-14 MED ORDER — ONDANSETRON HCL 4 MG/2ML IJ SOLN: 4.0000 mg | Freq: Four times a day (QID) | INTRAMUSCULAR | Status: DC | PRN

## 2023-07-14 MED ORDER — SODIUM CHLORIDE 0.9 % IV SOLN
INTRAVENOUS | Status: DC | PRN
  Administered 2023-07-14: 10 mL/h via INTRAVENOUS

## 2023-07-14 MED ORDER — SODIUM CHLORIDE 0.9 % WEIGHT BASED INFUSION: 3.0000 mL/kg/h | INTRAVENOUS | Status: AC

## 2023-07-14 MED ORDER — HEPARIN SODIUM (PORCINE) 1000 UNIT/ML IJ SOLN
INTRAMUSCULAR | Status: AC
  Filled 2023-07-14: qty 10

## 2023-07-14 MED ORDER — ISOSORBIDE MONONITRATE ER 30 MG PO TB24
30.0000 mg | ORAL_TABLET | Freq: Two times a day (BID) | ORAL | Status: DC
Start: 2023-07-14 — End: 2023-07-15
  Administered 2023-07-14 – 2023-07-15 (×2): 30 mg via ORAL
  Filled 2023-07-14 (×2): qty 1

## 2023-07-14 MED ORDER — NITROGLYCERIN 0.4 MG SL SUBL: 0.4000 mg | SUBLINGUAL_TABLET | SUBLINGUAL | Status: DC | PRN

## 2023-07-14 MED ORDER — SODIUM CHLORIDE 0.9 % WEIGHT BASED INFUSION: 1.0000 mL/kg/h | INTRAVENOUS | Status: DC

## 2023-07-14 MED ORDER — VERAPAMIL HCL 2.5 MG/ML IV SOLN
INTRAVENOUS | Status: AC
  Filled 2023-07-14: qty 2

## 2023-07-14 MED ORDER — INSULIN ASPART 100 UNIT/ML IJ SOLN
0.0000 [IU] | Freq: Three times a day (TID) | INTRAMUSCULAR | Status: DC
  Administered 2023-07-14 – 2023-07-15 (×2): 8 [IU] via SUBCUTANEOUS

## 2023-07-14 MED ORDER — PRASUGREL HCL 10 MG PO TABS
10.0000 mg | ORAL_TABLET | Freq: Every day | ORAL | Status: DC
  Administered 2023-07-15: 10 mg via ORAL
  Filled 2023-07-14: qty 1

## 2023-07-14 MED ORDER — LISINOPRIL 10 MG PO TABS
5.0000 mg | ORAL_TABLET | Freq: Every day | ORAL | Status: DC
  Administered 2023-07-15: 5 mg via ORAL
  Filled 2023-07-14: qty 1

## 2023-07-14 MED ORDER — ACETAMINOPHEN 325 MG PO TABS
650.0000 mg | ORAL_TABLET | ORAL | Status: DC | PRN
  Administered 2023-07-15: 650 mg via ORAL
  Filled 2023-07-14: qty 2

## 2023-07-14 MED ORDER — ALUM & MAG HYDROXIDE-SIMETH 200-200-20 MG/5ML PO SUSP
30.0000 mL | Freq: Once | ORAL | Status: AC
  Administered 2023-07-14: 30 mL via ORAL
  Filled 2023-07-14: qty 30

## 2023-07-14 MED ORDER — FENTANYL CITRATE (PF) 100 MCG/2ML IJ SOLN
INTRAMUSCULAR | Status: DC | PRN
  Administered 2023-07-14: 25 ug via INTRAVENOUS

## 2023-07-14 MED ORDER — HYDRALAZINE HCL 20 MG/ML IJ SOLN: 10.0000 mg | INTRAMUSCULAR | Status: DC | PRN

## 2023-07-14 MED ORDER — SODIUM CHLORIDE 0.9 % IV SOLN: 250.0000 mL | INTRAVENOUS | Status: DC | PRN

## 2023-07-14 MED ORDER — LIDOCAINE HCL (PF) 1 % IJ SOLN
INTRAMUSCULAR | Status: DC | PRN
  Administered 2023-07-14 (×2): 2 mL

## 2023-07-14 MED ORDER — ATORVASTATIN CALCIUM 80 MG PO TABS
80.0000 mg | ORAL_TABLET | Freq: Every day | ORAL | Status: DC
Start: 2023-07-14 — End: 2023-07-15
  Administered 2023-07-14: 80 mg via ORAL
  Filled 2023-07-14: qty 1

## 2023-07-14 MED ORDER — VERAPAMIL HCL 2.5 MG/ML IV SOLN
INTRAVENOUS | Status: DC | PRN
  Administered 2023-07-14: 10 mL via INTRA_ARTERIAL

## 2023-07-14 MED ORDER — MIDAZOLAM HCL 2 MG/2ML IJ SOLN
INTRAMUSCULAR | Status: DC | PRN
  Administered 2023-07-14: 1 mg via INTRAVENOUS

## 2023-07-14 MED ORDER — LABETALOL HCL 5 MG/ML IV SOLN: 10.0000 mg | INTRAVENOUS | Status: DC | PRN

## 2023-07-14 MED ORDER — ASPIRIN 81 MG PO TBEC
81.0000 mg | DELAYED_RELEASE_TABLET | Freq: Every day | ORAL | Status: DC
  Administered 2023-07-15: 81 mg via ORAL
  Filled 2023-07-14: qty 1

## 2023-07-14 MED ORDER — PRASUGREL HCL 10 MG PO TABS: 10.0000 mg | ORAL_TABLET | Freq: Every day | ORAL | Status: DC

## 2023-07-14 MED ORDER — PRASUGREL HCL 10 MG PO TABS
60.0000 mg | ORAL_TABLET | Freq: Once | ORAL | Status: AC
  Administered 2023-07-14: 60 mg via ORAL
  Filled 2023-07-14: qty 6

## 2023-07-14 MED ORDER — LIDOCAINE HCL (PF) 1 % IJ SOLN
INTRAMUSCULAR | Status: AC
  Filled 2023-07-14: qty 30

## 2023-07-14 MED ORDER — PRASUGREL HCL 10 MG PO TABS
10.0000 mg | ORAL_TABLET | Freq: Every day | ORAL | 2 refills | Status: AC
  Filled 2023-07-14: qty 30, 30d supply, fill #0

## 2023-07-14 MED ORDER — IOHEXOL 350 MG/ML SOLN
INTRAVENOUS | Status: DC | PRN
  Administered 2023-07-14: 85 mL

## 2023-07-14 MED ORDER — PANTOPRAZOLE SODIUM 40 MG PO TBEC
40.0000 mg | DELAYED_RELEASE_TABLET | Freq: Every morning | ORAL | Status: DC
Start: 2023-07-14 — End: 2023-07-15
  Administered 2023-07-14 – 2023-07-15 (×2): 40 mg via ORAL
  Filled 2023-07-14 (×2): qty 1

## 2023-07-14 MED ORDER — HEPARIN SODIUM (PORCINE) 1000 UNIT/ML IJ SOLN
INTRAMUSCULAR | Status: DC | PRN
  Administered 2023-07-14: 6500 [IU] via INTRAVENOUS

## 2023-07-14 MED ORDER — HEPARIN (PORCINE) IN NACL 1000-0.9 UT/500ML-% IV SOLN
INTRAVENOUS | Status: DC | PRN
  Administered 2023-07-14: 1000 mL

## 2023-07-14 MED ORDER — SODIUM CHLORIDE 0.9% FLUSH: 3.0000 mL | INTRAVENOUS | Status: DC | PRN

## 2023-07-14 MED ORDER — ACETAMINOPHEN 325 MG PO TABS: 650.0000 mg | ORAL_TABLET | ORAL | Status: DC | PRN

## 2023-07-14 MED ORDER — MIDAZOLAM HCL 2 MG/2ML IJ SOLN
INTRAMUSCULAR | Status: AC
  Filled 2023-07-14: qty 2

## 2023-07-14 MED ORDER — PRASUGREL HCL 10 MG PO TABS: 10.0000 mg | ORAL_TABLET | Freq: Every day | ORAL | 2 refills | Status: DC

## 2023-07-14 MED ORDER — SODIUM CHLORIDE 0.9% FLUSH: 3.0000 mL | Freq: Two times a day (BID) | INTRAVENOUS | Status: DC

## 2023-07-14 MED ORDER — TAMSULOSIN HCL 0.4 MG PO CAPS
0.4000 mg | ORAL_CAPSULE | Freq: Every day | ORAL | Status: DC
Start: 2023-07-14 — End: 2023-07-15
  Administered 2023-07-14: 0.4 mg via ORAL
  Filled 2023-07-14: qty 1

## 2023-07-14 SURGICAL SUPPLY — 16 items
CATH BALLN WEDGE 5F 110CM (CATHETERS) IMPLANT
CATH INFINITI 5FR AL1 (CATHETERS) IMPLANT
CATH INFINITI 5FR MULTPACK ANG (CATHETERS) IMPLANT
DEVICE RAD COMP TR BAND LRG (VASCULAR PRODUCTS) IMPLANT
ELECT DEFIB PAD ADLT CADENCE (PAD) IMPLANT
GLIDESHEATH SLEND SS 6F .021 (SHEATH) IMPLANT
GUIDEWIRE INQWIRE 1.5J.035X260 (WIRE) IMPLANT
INQWIRE 1.5J .035X260CM (WIRE) ×1 IMPLANT
KIT MICROPUNCTURE NIT STIFF (SHEATH) IMPLANT
KIT SYRINGE INJ CVI SPIKEX1 (MISCELLANEOUS) IMPLANT
PACK CARDIAC CATHETERIZATION (CUSTOM PROCEDURE TRAY) ×1 IMPLANT
SET ATX-X65L (MISCELLANEOUS) IMPLANT
SHEATH GLIDE SLENDER 4/5FR (SHEATH) IMPLANT
SHEATH PROBE COVER 6X72 (BAG) IMPLANT
WIRE EMERALD 3MM-J .025X260CM (WIRE) IMPLANT
WIRE MICROINTRODUCER 60CM (WIRE) IMPLANT

## 2023-07-14 NOTE — ED Triage Notes (Signed)
 Pt was in cath lab an hour ago and was heading home and started having SOB and left sided chest pain.

## 2023-07-14 NOTE — H&P (Addendum)
 Cardiology Admission History and Physical   Patient ID: YIGIT NORKUS MRN: [id number]; DOB: 03/13/1956   Admission date: 07/14/2023  PCP:  Bennie Pierini, FNP   [facility] Providers Cardiologist:  Nona Dell, MD        Chief Complaint:  chest pain and shortness of breath  Patient Profile:   [name] [name] is a 68 y.o. male with CAD (s/p CABG 2022), hyperlipidemia, hypertension, DM type II, obesity who is being seen 07/14/2023 for the evaluation of recurrent chest pain and dyspnea.  History of Present Illness:   Mr. Radu has significant CAD history. He received drug-eluting stent to the LAD/diagonal bifurcation. In 2021 he received drug-eluting stent to the ostial LAD and drug-eluting stent to the posterolateral in 2021, ultimately underwent CABG in May 2022 with SVG-diagonal, LIMA-LAD, SVG-OM1 and OM 3. Patient reported chronic chest pain at OV in January of this year, Imdur increased to 30mg  daily. He again reported exertional chest pain at OV on 06/29/23 with Dr. Diona Browner and Imdur again increased, 30mg  BID. He also underwent Myoview stress test that showed ischemia in lateral and inferoapical walls. Close follow up arranged and patient seen on 07/05/23 reporting no improvement in symptoms. Given persistent symptoms, cardiac catheterization was arranged.   Patient arrived on 04/16 for outpatient LHC with Dr. Herbie Baltimore. Patient found with 100% flush occlusion ostial LAD and more significantly, ostial Lcx with focal 70% stenosis followed by major Lateral Marginal (PL) with ostial 70% and then known subtotal occlusion of the distal AVG LCx to the PDA. Per Dr. Herbie Baltimore, "plan for staged PCI to the left circumflex and multiple locations on Monday morning." Patient loaded on Effient. Shortly after departing the hospital today, patient and spouse stopped at [facility] for lunch. Patient reports overall small portion but heavier food, patty melt and pie. Immediately following  this meal, patient had recurrence of mild chest pressure and more notably exertional dyspnea. Due to these symptoms and proximity to diagnostic LHC, patient returned to the ED.   ED ECG without acute changes from recent tracings, no acute ischemia, stable infero-lateral TWI. ED provider with reasonable suspicion for possible reflux following a heavy meal gave Maalox. On exam, patient reports improving symptoms, says the spells of dyspnea are not lasting as long. Clarifies that symptoms this afternoon not exactly the same as what he felt prior to [facility]. He is not having any chest pressure, palpitations, lightheadedness or dizziness. Patient does report a history of GERD but denies any similar of today's symptoms with what he typically experiences.   Given recurrent dyspnea with plans for staged PCI next week, will admit patient and plan intervention tomorrow with Dr. Swaziland.   Past Medical History:  Diagnosis Date   Anxiety    Coronary atherosclerosis of native coronary artery    DES distal circumflex 2005; DES LAD/diagonal bifurcation 09/2010; DES ostial LAD and DES left PL 01/2020; CABG May 2022 (LIMA to LAD, SVG to diagonal, SVG to OM1 and OM 3)   Essential hypertension    GERD (gastroesophageal reflux disease)    History of kidney stones    Mixed hyperlipidemia    Myocardial infarction (HCC)    Anterolateral with VF arrest 7/12   Type 2 diabetes mellitus (HCC)     Past Surgical History:  Procedure Laterality Date   CARDIAC CATHETERIZATION  2012   CAROTID STENT     stents x 2    CHOLECYSTECTOMY N/A 12/04/2015   Procedure: LAPAROSCOPIC CHOLECYSTECTOMY;  Surgeon: Loraine Leriche  Lovell Sheehan, MD;  Location: [facility];  Service: General;  Laterality: N/A;   CHOLECYSTECTOMY, LAPAROSCOPIC  12/05/2015   CORONARY ANGIOPLASTY  2012   STENT X 1 2012, STENT X 1 YRS BEFORE   CORONARY ARTERY BYPASS GRAFT N/A 08/21/2020   Procedure: CORONARY ARTERY BYPASS GRAFTING (CABG) X 4, USING LEFT INTERNAL MAMMARY ARTERY AND RIGHT  GREATER SAPHENOUS VEIN HARVESTED ENDOSCOPICALLY. SVG TO OM1, OM3 SEQUENTIALLY, SVG TO DIAG., LIMA TO LAD;  Surgeon: Loreli Slot, MD;  Location: [facility] OR;  Service: Open Heart Surgery;  Laterality: N/A;   CORONARY ATHERECTOMY N/A 02/09/2020   Procedure: CORONARY ATHERECTOMY;  Surgeon: Iran Ouch, MD;  Location: [facility] INVASIVE CV LAB;  Service: Cardiovascular;  Laterality: N/A;   CORONARY IMAGING/OCT N/A 05/22/2020   Procedure: INTRAVASCULAR IMAGING/OCT;  Surgeon: Corky Crafts, MD;  Location: [facility] INVASIVE CV LAB;  Service: Cardiovascular;  Laterality: N/A;   CORONARY STENT INTERVENTION N/A 02/09/2020   Procedure: CORONARY STENT INTERVENTION;  Surgeon: Iran Ouch, MD;  Location: [facility] INVASIVE CV LAB;  Service: Cardiovascular;  Laterality: N/A;   CORONARY STENT INTERVENTION N/A 05/22/2020   Procedure: CORONARY STENT INTERVENTION;  Surgeon: Corky Crafts, MD;  Location: [facility] INVASIVE CV LAB;  Service: Cardiovascular;  Laterality: N/A;   CORONARY ULTRASOUND/IVUS N/A 02/09/2020   Procedure: Intravascular Ultrasound/IVUS;  Surgeon: Iran Ouch, MD;  Location: [facility] INVASIVE CV LAB;  Service: Cardiovascular;  Laterality: N/A;   ENDOVEIN HARVEST OF GREATER SAPHENOUS VEIN Right 08/21/2020   Procedure: ENDOVEIN HARVEST OF GREATER SAPHENOUS VEIN;  Surgeon: Loreli Slot, MD;  Location: [facility] OR;  Service: Open Heart Surgery;  Laterality: Right;   HYDROCELE EXCISION Bilateral 06/02/2017   Procedure: HYDROCELECTOMY ADULT;  Surgeon: Rene Paci, MD;  Location: [facility];  Service: Urology;  Laterality: Bilateral;  ONLY NEEDS 45 MIN   INGUINAL HERNIA REPAIR     RIGHT GROIN   KNEE ARTHROSCOPY Left 05/23/2019   Procedure: LEFT KNEE ARTHROSCOPY AND DEBRIDEMENT PARTIAL MEDIAL MENISECTOMY;  Surgeon: Nadara Mustard, MD;  Location: [facility];  Service: Orthopedics;  Laterality: Left;   LEFT HEART CATH AND CORONARY ANGIOGRAPHY N/A 02/07/2020   Procedure: LEFT HEART  CATH AND CORONARY ANGIOGRAPHY;  Surgeon: Marykay Lex, MD;  Location: [facility] INVASIVE CV LAB;  Service: Cardiovascular;  Laterality: N/A;   LEFT HEART CATH AND CORONARY ANGIOGRAPHY N/A 05/22/2020   Procedure: LEFT HEART CATH AND CORONARY ANGIOGRAPHY;  Surgeon: Corky Crafts, MD;  Location: [facility] INVASIVE CV LAB;  Service: Cardiovascular;  Laterality: N/A;   LEFT HEART CATH AND CORONARY ANGIOGRAPHY N/A 08/15/2020   Procedure: LEFT HEART CATH AND CORONARY ANGIOGRAPHY;  Surgeon: Runell Gess, MD;  Location: [facility] INVASIVE CV LAB;  Service: Cardiovascular;  Laterality: N/A;   TEE WITHOUT CARDIOVERSION N/A 08/21/2020   Procedure: TRANSESOPHAGEAL ECHOCARDIOGRAM (TEE);  Surgeon: Loreli Slot, MD;  Location: [facility] OR;  Service: Open Heart Surgery;  Laterality: N/A;   TRIGGER FINGER RELEASE Right 01/08/2015   Procedure: RELEASE TRIGGER FINGER/A-1 PULLEY RIGHT MIDDLE FINGER;  Surgeon: Cindee Salt, MD;  Location: [facility];  Service: Orthopedics;  Laterality: Right;     Medications Prior to Admission: Prior to Admission medications   Medication Sig Start Date End Date Taking? Authorizing Provider  Accu-Chek Softclix Lancets lancets CHECK BLOOD SUGAR UP TO 4 TIMES A DAY.Dx E11.8 08/25/22   Bennie Pierini, FNP  aspirin EC 81 MG tablet Take 1 tablet (81 mg total) by mouth daily. Swallow whole. 03/19/21  Jonelle Sidle, MD  atorvastatin (LIPITOR) 80 MG tablet Take 1 tablet (80 mg total) by mouth daily. 02/22/23   Daphine Deutscher, Mary-Margaret, FNP  B-D ULTRAFINE III SHORT PEN 31G X 8 MM MISC USE 1 PEN NEEDLE UP TO 8 TIMES DAILY AS NEEDED WITH INSULIN Dx E11.8 04/19/23   Bennie Pierini, FNP  blood glucose meter kit and supplies Dispense based on patient and insurance preference. Use up to four times daily as directed. (FOR ICD-10 E10.9, E11.9). 04/26/19   Daphine Deutscher, Mary-Margaret, FNP  carvedilol (COREG) 6.25 MG tablet Take 1 tablet (6.25 mg total) by mouth 2 (two) times daily.  04/19/23   Jonelle Sidle, MD  CINNAMON PO Take 1,000 mg by mouth 2 (two) times daily.    [provider]  Continuous Glucose Sensor (FREESTYLE LIBRE 3 PLUS SENSOR) MISC CHANGE SENSOR EVERY 15 DAYS 07/05/23   Daphine Deutscher, Mary-Margaret, FNP  CRANBERRY PO Take 4,200 mg by mouth 2 (two) times daily.    [provider]  furosemide (LASIX) 20 MG tablet TAKE 1 TABLET BY MOUTH ONCE DAILY AS NEEDED FOR  LEG  SWELLING. Patient taking differently: Take 20 mg by mouth daily. 05/03/23   Daphine Deutscher, Mary-Margaret, FNP  glucose blood (ACCU-CHEK AVIVA PLUS) test strip CHECK BLOOD SUGAR UP TO 4 TIMES A DAY.Dx E11.8 08/25/22   Daphine Deutscher Mary-Margaret, FNP  insulin glargine (LANTUS) 100 UNIT/ML injection Inject 48-78 Units into the skin See admin instructions. 48 units every morning & 78 units at night    [provider]  Insulin Pen Needle 32G X 4 MM MISC 1 each by Does not apply route daily. 02/13/22   Daphine Deutscher, Mary-Margaret, FNP  isosorbide mononitrate (IMDUR) 30 MG 24 hr tablet Take 1 tablet (30 mg total) by mouth in the morning and at bedtime. 06/29/23   Jonelle Sidle, MD  lisinopril (ZESTRIL) 5 MG tablet Take 1 tablet (5 mg total) by mouth daily. 06/29/23   Jonelle Sidle, MD  loratadine (CLARITIN) 10 MG tablet Take 10 mg by mouth daily. 02/15/15   [provider]  metFORMIN (GLUCOPHAGE) 500 MG tablet Take 2 tablets by mouth twice daily 05/25/23   Daphine Deutscher, Mary-Margaret, FNP  nitroGLYCERIN (NITROSTAT) 0.4 MG SL tablet Place 1 tablet (0.4 mg total) under the tongue every 5 (five) minutes x 3 doses as needed for chest pain (if no relief after 2nd dose, proceed to ED or call 911). 06/29/23   Jonelle Sidle, MD  NOVOLOG FLEXPEN 100 UNIT/ML FlexPen INJECT 48 TO 78 UNITS SUBCUTANEOUSLY TWICE DAILY PER SLIDING SCALE Patient taking differently: Inject 48-78 Units into the skin See admin instructions. INJECT 48 TO 78 UNITS SUBCUTANEOUSLY THREE TIMES DAILY PER SLIDING SCALE 07/05/23   Daphine Deutscher,  Mary-Margaret, FNP  pantoprazole (PROTONIX) 40 MG tablet Take 1 tablet (40 mg total) by mouth daily as needed (for acid reflux). 02/22/23   Daphine Deutscher, Mary-Margaret, FNP  prasugrel (EFFIENT) 10 MG TABS tablet Take 1 tablet (10 mg total) by mouth daily. 07/14/23   Marykay Lex, MD  Semaglutide, 2 MG/DOSE, (OZEMPIC, 2 MG/DOSE,) 8 MG/3ML SOPN Inject 2 mg into the skin once a week. 02/22/23   Daphine Deutscher Mary-Margaret, FNP  tamsulosin (FLOMAX) 0.4 MG CAPS capsule Take 1 capsule (0.4 mg total) by mouth at bedtime. 02/22/23   Bennie Pierini, FNP     Allergies:   No Known Allergies  Social History:   Social History   Socioeconomic History   Marital status: Married    Spouse name: Dewayne Hatch  Number of children: 1   Years of education: 57   Highest education level: Master's degree (e.g., MA, MS, MEng, MEd, MSW, MBA)  Occupational History   Occupation: Retired    Associate Professor: [employer]  Tobacco Use   Smoking status: Never   Smokeless tobacco: Never  Vaping Use   Vaping status: Never Used  Substance and Sexual Activity   Alcohol use: No   Drug use: No   Sexual activity: Yes  Other Topics Concern   Not on file  Social History Narrative   Retired Buyer, retail; Lives with his wife.   1 son and family, lives close by.   Social Drivers of Corporate investment banker Strain: Low Risk  (02/22/2023)   Overall Financial Resource Strain (CARDIA)    Difficulty of Paying Living Expenses: Not hard at all  Food Insecurity: No Food Insecurity (02/22/2023)   Hunger Vital Sign    Worried About Running Out of Food in the Last Year: Never true    Ran Out of Food in the Last Year: Never true  Transportation Needs: No Transportation Needs (02/22/2023)   PRAPARE - Administrator, Civil Service (Medical): No    Lack of Transportation (Non-Medical): No  Physical Activity: Insufficiently Active (02/22/2023)   Exercise Vital Sign    Days of Exercise per Week: 3 days    Minutes of  Exercise per Session: 20 min  Stress: No Stress Concern Present (02/22/2023)   [employer] of Occupational Health - Occupational Stress Questionnaire    Feeling of Stress : Only a little  Social Connections: Unknown (02/22/2023)   Social Connection and Isolation Panel [NHANES]    Frequency of Communication with Friends and Family: Twice a week    Frequency of Social Gatherings with Friends and Family: Patient declined    Attends Religious Services: More than 4 times per year    Active Member of [employer] or Organizations: Yes    Attends Engineer, structural: More than 4 times per year    Marital Status: Married  Catering manager Violence: Not At Risk (07/29/2022)   Humiliation, Afraid, Rape, and Kick questionnaire    Fear of Current or Ex-Partner: No    Emotionally Abused: No    Physically Abused: No    Sexually Abused: No    Family History:   The patient's family history includes Dementia in his father; Diabetes in his father and maternal grandfather; Hyperlipidemia in his father and mother; Hypertension in his father and mother.    ROS:  Please see the history of present illness.  All other ROS reviewed and negative.     Physical Exam/Data:   Vitals:   07/14/23 1358 07/14/23 1358  BP:  (!) 178/90  Pulse:  89  Resp:  (!) 22  Temp:  97.9 F (36.6 C)  SpO2:  98%  Weight: 113.4 kg   Height: 5\' 8"  (1.727 m)    No intake or output data in the 24 hours ending 07/14/23 1446    07/14/2023    1:58 PM 07/14/2023    6:43 AM 07/05/2023    9:25 AM  Last 3 Weights  Weight (lbs) 250 lb 250 lb 259 lb  Weight (kg) 113.4 kg 113.399 kg 117.482 kg     Body mass index is 38.01 kg/m.  General:  Well nourished, well developed, in no acute distress. Mildly anxious appearing HEENT: normal Neck: no JVD Vascular: No carotid bruits; Distal pulses 2+ bilaterally   Cardiac:  normal S1, S2; RRR; no murmur  Lungs:  clear to auscultation bilaterally, no wheezing, rhonchi or rales  Abd:  soft, nontender, no hepatomegaly  Ext: trace LE edema Musculoskeletal:  No deformities, BUE and BLE strength normal and equal Skin: warm and dry  Neuro:  CNs 2-12 intact, no focal abnormalities noted Psych:  Normal affect    EKG:  The ECG that was done upon arrival in ED Triage was personally reviewed and demonstrates sinus rhythm without acute changes from recent tracings, no acute ischemia, stable infero-lateral TWI  Relevant CV Studies:  07/14/23 LHC    Prox LAD lesion is 100% stenosed.  Prox LAD to Mid LAD stent is 20% stenosed. Mid LAD-1 lesion is 20% stenosed with 100% stenosed side branch in 2nd Diag.  There is retrograde filling to the occlusion from the LIMA graft.   LIMA-dLAD graft was visualized by angiography and is normal in caliber. The graft exhibits no disease.   Dist LAD lesion is 60% stenosed beyond graft insertion.   Ost Cx to Prox Cx lesion is 70% stenosed.   1st Mrg lesion is 85% stenosed.   LPAV-1 lesion is 50% stenosed with 90% stenosed side branch in 1st LPL. 1st LPL-1 stent is widely patent. 1st LPL-2 lesion is 80% stenosed.   LPAV-2 lesion is 95% stenosed.   Prox RCA lesion is 80% stenosed.   --------------------------------------------   -----------------Grafts: Patent LIMA to LAD but otherwise occluded vein graft.--------------   SVG-1st Diag graft was not visualized due to known occlusion. Origin to Prox Graft lesion is 100% stenosed.   Seq SVG- SVG-OM1/RI-OM 3 graft was not visualized due to occlusion. Origin to Prox Graft lesion before Ramus is 100% stenosed.   --------------------------------------------   Hemodynamic findings consistent with borderline pulmonary hypertension.   There is no aortic valve stenosis.       Multivessel Disease: -100% flush occlusion ostial LAD => fills via LIMA to distal LAD with retrograde filling back up to most of the vessel, but occluded SVG-diagonal - Ostial LCx focal 70% stenosis followed by major Lateral Marginal  (PL) with ostial 70% and then known subtotal occlusion of the distal AVG LCx to the PDA - Small nondominant RCA with mid 80% stenosis-stable   Grafts: Patent LIMA to LAD but flush occlusion of SVG-D1 and SVG-OM1-OM3. RHC: RA mean 11 mmHg, RV-EDP 37/1-10 mmHg; PAP-mean 32/13-21 mmHg; PCP mean 18 mmHg. Ao sat 93%, PA sat 63%. Fick CO-CI 5.71-2.55  Arterial hemodynamics: LVEDP-EDP 142/9-18 mmHg; ALP-MAP 136/67-99 mmHg.     RECOMMENDATION   In the absence of any other complications or medical issues, we expect the patient to be ready for discharge from a cath perspective on 07/14/2023.   Plan will be for staged PCI to the circumflex and multiple locations on Monday morning 07/19/2023: Will be scheduled for first case.   Recommend uninterrupted dual antiplatelet therapy with Prasugrel 10mg  daily for a minimum of 6 months (stable ischemic heart disease-Class I recommendation).   Load with 60 mg Effient and will be discharged on 10 mg daily. With plan for staged PCI to the LCx in multiple locations, with plan to continue thienopyridine coverage beyond 6 months, at 6 months could convert to complete available monotherapy for long-term management.  Diagnostic Dominance: Left   Laboratory Data:  High Sensitivity Troponin:  No results for input(s): "TROPONINIHS" in the last 720 hours.    ChemistryNo results for input(s): "NA", "K", "CL", "CO2", "GLUCOSE", "BUN", "CREATININE", "CALCIUM", "MG", "GFRNONAA", "GFRAA", "ANIONGAP" in the  last 168 hours.  No results for input(s): "PROT", "ALBUMIN", "AST", "ALT", "ALKPHOS", "BILITOT" in the last 168 hours. Lipids No results for input(s): "CHOL", "TRIG", "HDL", "LABVLDL", "LDLCALC", "CHOLHDL" in the last 168 hours. HematologyNo results for input(s): "WBC", "RBC", "HGB", "HCT", "MCV", "MCH", "MCHC", "RDW", "PLT" in the last 168 hours. Thyroid No results for input(s): "TSH", "FREET4" in the last 168 hours. BNPNo results for input(s): "BNP", "PROBNP" in the last 168  hours.  DDimer No results for input(s): "DDIMER" in the last 168 hours.   Radiology/Studies:  CARDIAC CATHETERIZATION Result Date: 07/14/2023   Prox LAD lesion is 100% stenosed.  Prox LAD to Mid LAD stent is 20% stenosed. Mid LAD-1 lesion is 20% stenosed with 100% stenosed side branch in 2nd Diag.  There is retrograde filling to the occlusion from the LIMA graft.   LIMA-dLAD graft was visualized by angiography and is normal in caliber. The graft exhibits no disease.   Dist LAD lesion is 60% stenosed beyond graft insertion.   Ost Cx to Prox Cx lesion is 70% stenosed.   1st Mrg lesion is 85% stenosed.   LPAV-1 lesion is 50% stenosed with 90% stenosed side branch in 1st LPL. 1st LPL-1 stent is widely patent. 1st LPL-2 lesion is 80% stenosed.   LPAV-2 lesion is 95% stenosed.   Prox RCA lesion is 80% stenosed.   --------------------------------------------   -----------------Grafts: Patent LIMA to LAD but otherwise occluded vein graft.--------------   SVG-1st Diag graft was not visualized due to known occlusion. Origin to Prox Graft lesion is 100% stenosed.   Seq SVG- SVG-OM1/RI-OM 3 graft was not visualized due to occlusion. Origin to Prox Graft lesion before Ramus is 100% stenosed.   --------------------------------------------   Hemodynamic findings consistent with borderline pulmonary hypertension.   There is no aortic valve stenosis. Multivessel Disease: -100% flush occlusion ostial LAD => fills via LIMA to distal LAD with retrograde filling back up to most of the vessel, but occluded SVG-diagonal - Ostial LCx focal 70% stenosis followed by major Lateral Marginal (PL) with ostial 70% and then known subtotal occlusion of the distal AVG LCx to the PDA - Small nondominant RCA with mid 80% stenosis-stable Grafts: Patent LIMA to LAD but flush occlusion of SVG-D1 and SVG-OM1-OM3. RHC: RA mean 11 mmHg, RV-EDP 37/1-10 mmHg; PAP-mean 32/13-21 mmHg; PCP mean 18 mmHg. Ao sat 93%, PA sat 63%. Fick CO-CI 5.71-2.55 Arterial  hemodynamics: LVEDP-EDP 142/9-18 mmHg; ALP-MAP 136/67-99 mmHg. RECOMMENDATION   In the absence of any other complications or medical issues, we expect the patient to be ready for discharge from a cath perspective on 07/14/2023.   Plan will be for staged PCI to the circumflex and multiple locations on Monday morning 07/19/2023: Will be scheduled for first case.   Recommend uninterrupted dual antiplatelet therapy with Prasugrel 10mg  daily for a minimum of 6 months (stable ischemic heart disease-Class I recommendation).   Load with 60 mg Effient and will be discharged on 10 mg daily. With plan for staged PCI to the LCx in multiple locations, with plan to continue thienopyridine coverage beyond 6 months, at 6 months could convert to complete available monotherapy for long-term management. Bryan Lemma, MD    Assessment and Plan:   Multivessel CAD Patient status post DES to the distal circumflex in 2005, DES to the LAD/diagonal bifurcation in 2012, DES to the ostial LAD and DES to the posterolateral in 2021, and ultimately CABG in May 2022 with LIMA to LAD, SVG to diagonal, and SVG to OM1 and  OM. Patient with worsening chronic chest pain and dyspnea on exertion despite titration of medical therapy. Underwent LHC today with plans for staged PCI on Monday but had acute dyspnea with some chest discomfort shortly after departing the hospital today/eating lunch and returned to the ED. ECG without acute ischemic changes, stable appearing to recent tracings with inferolateral TWI.  ED provider POCUS without pericardial effusion. STAT limited echo ordered to formally assess. No muffling of heart sounds on auscultation. Admit for stage PCI with Dr. Swaziland tomorrow, check troponin.  Continue beta blocker, Imdur, ASA, Effient, statin Will also given Protonix given potential reflux etiology of symptoms.  Informed Consent   Shared Decision Making/Informed Consent The risks [stroke (1 in 1000), death (1 in 1000), kidney  failure [usually temporary] (1 in 500), bleeding (1 in 200), allergic reaction [possibly serious] (1 in 200)], benefits (diagnostic support and management of coronary artery disease) and alternatives of a cardiac catheterization were discussed in detail with Mr. Shough and he is willing to proceed.      Hyperlipidemia Continue Atorvastatin 80mg . AM lipid panel ordered.  Lab Results  Component Value Date   CHOL 97 (L) 02/22/2023   HDL 22 (L) 02/22/2023   LDLCALC 40 02/22/2023   TRIG 219 (H) 02/22/2023   CHOLHDL 4.4 02/22/2023   Hypertension Patient with elevated BP in the ED this afternoon.  Continue Coreg 6.25mg  BID, Imdur 30mg  BID, Lisinopril 5mg   DM type II Will maintain patient on SSI while admitted.   Risk Assessment/Risk Scores:       Code Status: Full Code  Severity of Illness: The appropriate patient status for this patient is OBSERVATION. Observation status is judged to be reasonable and necessary in order to provide the required intensity of service to ensure the patient's safety. The patient's presenting symptoms, physical exam findings, and initial radiographic and laboratory data in the context of their medical condition is felt to place them at decreased risk for further clinical deterioration. Furthermore, it is anticipated that the patient will be medically stable for discharge from the hospital within 2 midnights of admission.    For questions or updates, please contact [facility] Please consult [email or web address] for contact info under     Signed, Leala Prince, PA-C  07/14/2023 2:46 PM

## 2023-07-14 NOTE — Interval H&P Note (Signed)
 History and Physical Interval Note:  07/14/2023 8:42 AM  [name] [name]  has presented today for surgery, with the diagnosis of chest pain.  The various methods of treatment have been discussed with the patient and family. After consideration of risks, benefits and other options for treatment, the patient has consented to  Procedure(s): RIGHT/LEFT HEART CATH AND CORONARY/GRAFT ANGIOGRAPHY (N/A)  PERCUTANEOUS CORONARY INTERVENTION  as a surgical intervention.  The patient's history has been reviewed, patient examined, no change in status, stable for surgery.  I have reviewed the patient's chart and labs.  Questions were answered to the patient's satisfaction.    Cath Lab Visit (complete for each Cath Lab visit)  Clinical Evaluation Leading to the Procedure:   ACS: No.  Non-ACS:    Anginal Classification: CCS II  Anti-ischemic medical therapy: Minimal Therapy (1 class of medications)  Non-Invasive Test Results: Intermediate-risk stress test findings: cardiac mortality 1-3%/year  Prior CABG: Previous CABG     [name] [name]

## 2023-07-14 NOTE — Discharge Instructions (Addendum)
 NO METFORMIN FOR 2 DAYS    Drink plenty of fluids for 48 hours and keep wrist elevated at heart level for 24 hours  Radial Site Care   This sheet gives you information about how to care for yourself after your procedure. Your health care provider may also give you more specific instructions. If you have problems or questions, contact your health care provider. What can I expect after the procedure? After the procedure, it is common to have: Bruising and tenderness at the catheter insertion area. Follow these instructions at home: Medicines Take over-the-counter and prescription medicines only as told by your health care provider. Insertion site care Follow instructions from your health care provider about how to take care of your insertion site. Make sure you: Wash your hands with soap and water before you change your bandage (dressing). If soap and water are not available, use hand sanitizer. Remove your dressing as told by your health care provider. In 24 hours Check your insertion site every day for signs of infection. Check for: Redness, swelling, or pain. Fluid or blood. Pus or a bad smell. Warmth. Do not take baths, swim, or use a hot tub until your health care provider approves. For 5 days You may shower 24 hours after the procedure, or as directed by your health care provider. Remove the dressing and gently wash the site with plain soap and water. Pat the area dry with a clean towel. Do not rub the site. That could cause bleeding. Do not apply powder or lotion to the site. Activity   For 24 hours after the procedure, or as directed by your health care provider: Do not flex or bend the affected arm. Do not push or pull heavy objects with the affected arm. Do not drive yourself home from the hospital or clinic. You may drive 24 hours after the procedure unless your health care provider tells you not to. Do not operate machinery or power tools. Do not lift anything that is  heavier than 10 lb (4.5 kg), or the limit that you are told, until your health care provider says that it is safe.  For 4 days Ask your health care provider when it is okay to: Return to work or school. Resume usual physical activities or sports. Resume sexual activity. General instructions If the catheter site starts to bleed, raise your arm and put firm pressure on the site. If the bleeding does not stop, get help right away. This is a medical emergency. If you went home on the same day as your procedure, a responsible adult should be with you for the first 24 hours after you arrive home. Keep all follow-up visits as told by your health care provider. This is important. Contact a health care provider if: You have a fever. You have redness, swelling, or yellow drainage around your insertion site. Get help right away if: You have unusual pain at the radial site. The catheter insertion area swells very fast. The insertion area is bleeding, and the bleeding does not stop when you hold steady pressure on the area. Your arm or hand becomes pale, cool, tingly, or numb. These symptoms may represent a serious problem that is an emergency. Do not wait to see if the symptoms will go away. Get medical help right away. Call your local emergency services (911 in the [place] Do not drive yourself to the hospital. Summary After the procedure, it is common to have bruising and tenderness at the site. Follow instructions from  your health care provider about how to take care of your radial site wound. Check the wound every day for signs of infection. Do not lift anything that is heavier than 10 lb (4.5 kg), or the limit that you are told, until your health care provider says that it is safe. This information is not intended to replace advice given to you by your health care provider. Make sure you discuss any questions you have with your health care provider. Document Revised: 04/21/2017 Document Reviewed:  04/21/2017 Elsevier Patient Education  2020 [employer].

## 2023-07-14 NOTE — ED Provider Notes (Signed)
 [place] [facility] AT [facility] Provider Note   CSN: [id number] Arrival date & time: 07/14/23  1353     History  Chief Complaint  Patient presents with   Shortness of Breath    [name] [name] is a 68 y.o. male.  68 yo M with a chief complaints chest pressure and difficulty breathing.  The patient just had a cardiac catheterization done earlier today he had gone to get some lunch and then noticed that he was having significant difficulty breathing.  Ended up coming back to the ER to be evaluated.  Feels little bit better now but is still having some fullness to his chest.   Shortness of Breath      Home Medications Prior to Admission medications   Medication Sig Start Date End Date Taking? Authorizing Provider  Accu-Chek Softclix Lancets lancets CHECK BLOOD SUGAR UP TO 4 TIMES A DAY.Dx E11.8 08/25/22   Bennie Pierini, FNP  aspirin EC 81 MG tablet Take 1 tablet (81 mg total) by mouth daily. Swallow whole. 03/19/21   Jonelle Sidle, MD  atorvastatin (LIPITOR) 80 MG tablet Take 1 tablet (80 mg total) by mouth daily. 02/22/23   Daphine Deutscher, Mary-Margaret, FNP  B-D ULTRAFINE III SHORT PEN 31G X 8 MM MISC USE 1 PEN NEEDLE UP TO 8 TIMES DAILY AS NEEDED WITH INSULIN Dx E11.8 04/19/23   Bennie Pierini, FNP  blood glucose meter kit and supplies Dispense based on patient and insurance preference. Use up to four times daily as directed. (FOR ICD-10 E10.9, E11.9). 04/26/19   Daphine Deutscher, Mary-Margaret, FNP  carvedilol (COREG) 6.25 MG tablet Take 1 tablet (6.25 mg total) by mouth 2 (two) times daily. 04/19/23   Jonelle Sidle, MD  CINNAMON PO Take 1,000 mg by mouth 2 (two) times daily.    [provider]  Continuous Glucose Sensor (FREESTYLE LIBRE 3 PLUS SENSOR) MISC CHANGE SENSOR EVERY 15 DAYS 07/05/23   Daphine Deutscher, Mary-Margaret, FNP  CRANBERRY PO Take 4,200 mg by mouth 2 (two) times daily.    [provider]  furosemide (LASIX) 20 MG tablet TAKE 1  TABLET BY MOUTH ONCE DAILY AS NEEDED FOR  LEG  SWELLING. Patient taking differently: Take 20 mg by mouth daily. 05/03/23   Daphine Deutscher, Mary-Margaret, FNP  glucose blood (ACCU-CHEK AVIVA PLUS) test strip CHECK BLOOD SUGAR UP TO 4 TIMES A DAY.Dx E11.8 08/25/22   Daphine Deutscher Mary-Margaret, FNP  insulin glargine (LANTUS) 100 UNIT/ML injection Inject 48-78 Units into the skin See admin instructions. 48 units every morning & 78 units at night    [provider]  Insulin Pen Needle 32G X 4 MM MISC 1 each by Does not apply route daily. 02/13/22   Daphine Deutscher, Mary-Margaret, FNP  isosorbide mononitrate (IMDUR) 30 MG 24 hr tablet Take 1 tablet (30 mg total) by mouth in the morning and at bedtime. 06/29/23   Jonelle Sidle, MD  lisinopril (ZESTRIL) 5 MG tablet Take 1 tablet (5 mg total) by mouth daily. 06/29/23   Jonelle Sidle, MD  loratadine (CLARITIN) 10 MG tablet Take 10 mg by mouth daily. 02/15/15   [provider]  metFORMIN (GLUCOPHAGE) 500 MG tablet Take 2 tablets by mouth twice daily 05/25/23   Daphine Deutscher, Mary-Margaret, FNP  nitroGLYCERIN (NITROSTAT) 0.4 MG SL tablet Place 1 tablet (0.4 mg total) under the tongue every 5 (five) minutes x 3 doses as needed for chest pain (if no relief after 2nd dose, proceed to ED or call 911). 06/29/23  Gerard Knight, MD  NOVOLOG FLEXPEN 100 UNIT/ML FlexPen INJECT 48 TO 78 UNITS SUBCUTANEOUSLY TWICE DAILY PER SLIDING SCALE Patient taking differently: Inject 48-78 Units into the skin See admin instructions. INJECT 48 TO 78 UNITS SUBCUTANEOUSLY THREE TIMES DAILY PER SLIDING SCALE 07/05/23   Gaylyn Keas, Mary-Margaret, FNP  pantoprazole (PROTONIX) 40 MG tablet Take 1 tablet (40 mg total) by mouth daily as needed (for acid reflux). 02/22/23   Gaylyn Keas, Mary-Margaret, FNP  prasugrel (EFFIENT) 10 MG TABS tablet Take 1 tablet (10 mg total) by mouth daily. 07/14/23   Arleen Lacer, MD  Semaglutide, 2 MG/DOSE, (OZEMPIC, 2 MG/DOSE,) 8 MG/3ML SOPN Inject 2 mg into the skin once a  week. 02/22/23   Gaylyn Keas Mary-Margaret, FNP  tamsulosin (FLOMAX) 0.4 MG CAPS capsule Take 1 capsule (0.4 mg total) by mouth at bedtime. 02/22/23   Delfina Feller, FNP      Allergies    Patient has no known allergies.    Review of Systems   Review of Systems  Respiratory:  Positive for shortness of breath.     Physical Exam Updated Vital Signs BP (!) 143/79   Pulse 76   Temp 97.9 F (36.6 C)   Resp 16   Ht 5\' 8"  (1.727 m)   Wt 113.4 kg   SpO2 98%   BMI 38.01 kg/m  Physical Exam Vitals and nursing note reviewed.  Constitutional:      Appearance: He is well-developed.  HENT:     Head: Normocephalic and atraumatic.  Eyes:     Pupils: Pupils are equal, round, and reactive to light.  Neck:     Vascular: No JVD.  Cardiovascular:     Rate and Rhythm: Normal rate and regular rhythm.     Heart sounds: No murmur heard.    No friction rub. No gallop.  Pulmonary:     Effort: No respiratory distress.     Breath sounds: No wheezing.  Abdominal:     General: There is no distension.     Tenderness: There is no abdominal tenderness. There is no guarding or rebound.  Musculoskeletal:        General: Normal range of motion.     Cervical back: Normal range of motion and neck supple.  Skin:    Coloration: Skin is not pale.     Findings: No rash.  Neurological:     Mental Status: He is alert and oriented to person, place, and time.  Psychiatric:        Behavior: Behavior normal.     ED Results / Procedures / Treatments   Labs (all labs ordered are listed, but only abnormal results are displayed) Labs Reviewed  BASIC METABOLIC PANEL WITH GFR  CBC  HEMOGLOBIN A1C  HIV ANTIBODY (ROUTINE TESTING W REFLEX)  TROPONIN I (HIGH SENSITIVITY)  TROPONIN I (HIGH SENSITIVITY)    EKG EKG Interpretation Date/Time:  Wednesday July 14 2023 14:01:39 EDT Ventricular Rate:  86 PR Interval:  206 QRS Duration:  82 QT Interval:  368 QTC Calculation: 440 R Axis:   69  Text  Interpretation: Normal sinus rhythm ST & T wave abnormality, consider lateral ischemia Abnormal ECG No significant change since last tracing Confirmed by Albertus Hughs [phone]) on 07/14/2023 2:02:28 PM  Radiology CARDIAC CATHETERIZATION Result Date: 07/14/2023   Prox LAD lesion is 100% stenosed.  Prox LAD to Mid LAD stent is 20% stenosed. Mid LAD-1 lesion is 20% stenosed with 100% stenosed side branch in 2nd Diag.  There is retrograde  filling to the occlusion from the LIMA graft.   LIMA-dLAD graft was visualized by angiography and is normal in caliber. The graft exhibits no disease.   Dist LAD lesion is 60% stenosed beyond graft insertion.   Ost Cx to Prox Cx lesion is 70% stenosed.   1st Mrg lesion is 85% stenosed.   LPAV-1 lesion is 50% stenosed with 90% stenosed side branch in 1st LPL. 1st LPL-1 stent is widely patent. 1st LPL-2 lesion is 80% stenosed.   LPAV-2 lesion is 95% stenosed.   Prox RCA lesion is 80% stenosed.   --------------------------------------------   -----------------Grafts: Patent LIMA to LAD but otherwise occluded vein graft.--------------   SVG-1st Diag graft was not visualized due to known occlusion. Origin to Prox Graft lesion is 100% stenosed.   Seq SVG- SVG-OM1/RI-OM 3 graft was not visualized due to occlusion. Origin to Prox Graft lesion before Ramus is 100% stenosed.   --------------------------------------------   Hemodynamic findings consistent with borderline pulmonary hypertension.   There is no aortic valve stenosis. Multivessel Disease: -100% flush occlusion ostial LAD => fills via LIMA to distal LAD with retrograde filling back up to most of the vessel, but occluded SVG-diagonal - Ostial LCx focal 70% stenosis followed by major Lateral Marginal (PL) with ostial 70% and then known subtotal occlusion of the distal AVG LCx to the PDA - Small nondominant RCA with mid 80% stenosis-stable Grafts: Patent LIMA to LAD but flush occlusion of SVG-D1 and SVG-OM1-OM3. RHC: RA mean 11 mmHg,  RV-EDP 37/1-10 mmHg; PAP-mean 32/13-21 mmHg; PCP mean 18 mmHg. Ao sat 93%, PA sat 63%. Fick CO-CI 5.71-2.55 Arterial hemodynamics: LVEDP-EDP 142/9-18 mmHg; ALP-MAP 136/67-99 mmHg. RECOMMENDATION   In the absence of any other complications or medical issues, we expect the patient to be ready for discharge from a cath perspective on 07/14/2023.   Plan will be for staged PCI to the circumflex and multiple locations on Monday morning 07/19/2023: Will be scheduled for first case.   Recommend uninterrupted dual antiplatelet therapy with Prasugrel 10mg  daily for a minimum of 6 months (stable ischemic heart disease-Class I recommendation).   Load with 60 mg Effient and will be discharged on 10 mg daily. With plan for staged PCI to the LCx in multiple locations, with plan to continue thienopyridine coverage beyond 6 months, at 6 months could convert to complete available monotherapy for long-term management. Bryan Lemma, MD   Procedures Procedures     EMERGENCY DEPARTMENT [place] CARDIAC EXAM "Study: Limited Ultrasound of the Heart and Pericardium"  INDICATIONS:Chest pain and Dyspnea Multiple views of the heart and pericardium were obtained in real-time with a multi-frequency probe.  PERFORMED ZO:XWRUEA IMAGES ARCHIVED?: Yes LIMITATIONS:  Body habitus and Emergent procedure VIEWS USED: Subcostal 4 chamber, Parasternal long axis, and Apical 4 chamber  INTERPRETATION: Cardiac activity present, Pericardial effusioin absent, Cardiac tamponade absent, and Normal contractility    Medications Ordered in ED Medications  pantoprazole (PROTONIX) EC tablet 40 mg (has no administration in time range)  aspirin EC tablet 81 mg (has no administration in time range)  nitroGLYCERIN (NITROSTAT) SL tablet 0.4 mg (has no administration in time range)  acetaminophen (TYLENOL) tablet 650 mg (has no administration in time range)  ondansetron (ZOFRAN) injection 4 mg (has no administration in time range)  atorvastatin (LIPITOR)  tablet 80 mg (has no administration in time range)  carvedilol (COREG) tablet 6.25 mg (has no administration in time range)  isosorbide mononitrate (IMDUR) 24 hr tablet 30 mg (has no administration in time range)  lisinopril (ZESTRIL) tablet 5 mg (has no administration in time range)  prasugrel (EFFIENT) tablet 10 mg (has no administration in time range)  tamsulosin (FLOMAX) capsule 0.4 mg (has no administration in time range)  insulin aspart (novoLOG) injection 0-15 Units (has no administration in time range)  alum & mag hydroxide-simeth (MAALOX/MYLANTA) 200-200-20 MG/5ML suspension 30 mL (30 mLs Oral Given 07/14/23 1446)    ED Course/ Medical Decision Making/ A&P                                 Medical Decision Making Amount and/or Complexity of Data Reviewed Labs: ordered. Radiology: ordered.  Risk OTC drugs. Decision regarding hospitalization.   68 yo M with chief complaints of chest pressure and difficulty breathing.  History of multiple MIs.  Just had a cardiac cath earlier today.  No obvious pneumothorax on lung exam.  Bedside ultrasound without obvious pericardial effusion will discuss with cardiology.  Cards will admit.   The patients results and plan were reviewed and discussed.   Any x-rays performed were independently reviewed by myself.   Differential diagnosis were considered with the presenting HPI.  Medications  pantoprazole (PROTONIX) EC tablet 40 mg (has no administration in time range)  aspirin EC tablet 81 mg (has no administration in time range)  nitroGLYCERIN (NITROSTAT) SL tablet 0.4 mg (has no administration in time range)  acetaminophen (TYLENOL) tablet 650 mg (has no administration in time range)  ondansetron (ZOFRAN) injection 4 mg (has no administration in time range)  atorvastatin (LIPITOR) tablet 80 mg (has no administration in time range)  carvedilol (COREG) tablet 6.25 mg (has no administration in time range)  isosorbide mononitrate (IMDUR) 24  hr tablet 30 mg (has no administration in time range)  lisinopril (ZESTRIL) tablet 5 mg (has no administration in time range)  prasugrel (EFFIENT) tablet 10 mg (has no administration in time range)  tamsulosin (FLOMAX) capsule 0.4 mg (has no administration in time range)  insulin aspart (novoLOG) injection 0-15 Units (has no administration in time range)  alum & mag hydroxide-simeth (MAALOX/MYLANTA) 200-200-20 MG/5ML suspension 30 mL (30 mLs Oral Given 07/14/23 1446)    Vitals:   07/14/23 1358 07/14/23 1358 07/14/23 1445  BP:  (!) 178/90 (!) 143/79  Pulse:  89 76  Resp:  (!) 22 16  Temp:  97.9 F (36.6 C)   SpO2:  98% 98%  Weight: 113.4 kg    Height: 5\' 8"  (1.727 m)      Final diagnoses:  Chest pain with high risk for cardiac etiology          Final Clinical Impression(s) / ED Diagnoses Final diagnoses:  Chest pain with high risk for cardiac etiology    Rx / DC Orders ED Discharge Orders     None         Albertus Hughs, DO 07/14/23 1613

## 2023-07-15 ENCOUNTER — Ambulatory Visit (HOSPITAL_COMMUNITY)
Admission: EM | Disposition: A | Discharge status: Home / Self Care | Payer: Self-pay | Source: Home / Self Care | Attending: Emergency Medicine

## 2023-07-15 ENCOUNTER — Encounter (HOSPITAL_COMMUNITY): Payer: Self-pay | Admitting: Cardiology

## 2023-07-15 ENCOUNTER — Ambulatory Visit (HOSPITAL_COMMUNITY): Admission: RE | Admit: 2023-07-15 | Source: Home / Self Care | Admitting: Cardiology

## 2023-07-15 ENCOUNTER — Other Ambulatory Visit: Payer: Self-pay

## 2023-07-15 DIAGNOSIS — I10 Essential (primary) hypertension: Secondary | ICD-10-CM | POA: Diagnosis not present

## 2023-07-15 DIAGNOSIS — I2510 Atherosclerotic heart disease of native coronary artery without angina pectoris: Secondary | ICD-10-CM

## 2023-07-15 DIAGNOSIS — Z7982 Long term (current) use of aspirin: Secondary | ICD-10-CM | POA: Diagnosis not present

## 2023-07-15 DIAGNOSIS — Z955 Presence of coronary angioplasty implant and graft: Secondary | ICD-10-CM | POA: Diagnosis not present

## 2023-07-15 DIAGNOSIS — R0600 Dyspnea, unspecified: Secondary | ICD-10-CM | POA: Diagnosis not present

## 2023-07-15 DIAGNOSIS — E785 Hyperlipidemia, unspecified: Secondary | ICD-10-CM | POA: Diagnosis not present

## 2023-07-15 DIAGNOSIS — Z951 Presence of aortocoronary bypass graft: Secondary | ICD-10-CM | POA: Diagnosis not present

## 2023-07-15 DIAGNOSIS — Z79899 Other long term (current) drug therapy: Secondary | ICD-10-CM | POA: Diagnosis not present

## 2023-07-15 DIAGNOSIS — E119 Type 2 diabetes mellitus without complications: Secondary | ICD-10-CM | POA: Diagnosis not present

## 2023-07-15 HISTORY — PX: CORONARY STENT INTERVENTION: CATH118234

## 2023-07-15 HISTORY — PX: CORONARY LITHOTRIPSY: CATH118330

## 2023-07-15 LAB — BASIC METABOLIC PANEL WITH GFR
Anion gap: 8 (ref 5–15)
BUN: 20 mg/dL (ref 8–23)
CO2: 24 mmol/L (ref 22–32)
Calcium: 9 mg/dL (ref 8.9–10.3)
Chloride: 103 mmol/L (ref 98–111)
Creatinine, Ser: 1 mg/dL (ref 0.61–1.24)
GFR, Estimated: >60 mL/min (ref >60)
Glucose, Bld: 241 mg/dL — ABNORMAL HIGH (ref 70–99)
Potassium: 4.4 mmol/L (ref 3.5–5.1)
Sodium: 135 mmol/L (ref 135–145)

## 2023-07-15 LAB — POCT I-STAT EG7
Acid-Base Excess: 1 mmol/L (ref 0.0–2.0)
Bicarbonate: 27 mmol/L (ref 20.0–28.0)
Calcium, Ion: 1.18 mmol/L (ref 1.15–1.40)
HCT: 40 % (ref 39.0–52.0)
Hemoglobin: 13.6 g/dL (ref 13.0–17.0)
O2 Saturation: 64 %
Potassium: 4.7 mmol/L (ref 3.5–5.1)
Sodium: 138 mmol/L (ref 135–145)
TCO2: 29 mmol/L (ref 22–32)
pCO2, Ven: 49.4 mmHg (ref 44–60)
pH, Ven: 7.346 (ref 7.25–7.43)
pO2, Ven: 35 mmHg (ref 32–45)

## 2023-07-15 LAB — GLUCOSE, CAPILLARY: Glucose-Capillary: 206 mg/dL — ABNORMAL HIGH (ref 70–99)

## 2023-07-15 LAB — CBC
HCT: 41.1 % (ref 39.0–52.0)
Hemoglobin: 12.9 g/dL — ABNORMAL LOW (ref 13.0–17.0)
MCH: 27.2 pg (ref 26.0–34.0)
MCHC: 31.4 g/dL (ref 30.0–36.0)
MCV: 86.5 fL (ref 80.0–100.0)
Platelets: 218 10*3/uL (ref 150–400)
RBC: 4.75 MIL/uL (ref 4.22–5.81)
RDW: 15.3 % (ref 11.5–15.5)
WBC: 7.7 10*3/uL (ref 4.0–10.5)
nRBC: 0 % (ref 0.0–0.2)

## 2023-07-15 LAB — LIPID PANEL
Cholesterol: 82 mg/dL (ref 0–200)
HDL: 22 mg/dL — ABNORMAL LOW (ref >40)
LDL Cholesterol: 39 mg/dL (ref 0–99)
Total CHOL/HDL Ratio: 3.7 ratio
Triglycerides: 106 mg/dL (ref <150)
VLDL: 21 mg/dL (ref 0–40)

## 2023-07-15 LAB — POCT ACTIVATED CLOTTING TIME
Activated Clotting Time: 654 s
Activated Clotting Time: 273 s
Activated Clotting Time: 348 s

## 2023-07-15 LAB — CBG MONITORING, ED: Glucose-Capillary: 254 mg/dL — ABNORMAL HIGH (ref 70–99)

## 2023-07-15 SURGERY — CORONARY STENT INTERVENTION
Anesthesia: LOCAL

## 2023-07-15 MED ORDER — LIDOCAINE HCL (PF) 1 % IJ SOLN
INTRAMUSCULAR | Status: DC | PRN
  Administered 2023-07-15: 2 mL

## 2023-07-15 MED ORDER — MIDAZOLAM HCL 2 MG/2ML IJ SOLN
INTRAMUSCULAR | Status: AC
  Filled 2023-07-15: qty 2

## 2023-07-15 MED ORDER — HEPARIN SODIUM (PORCINE) 1000 UNIT/ML IJ SOLN
INTRAMUSCULAR | Status: DC | PRN
  Administered 2023-07-15: 3000 [IU] via INTRAVENOUS
  Administered 2023-07-15: 10000 [IU] via INTRAVENOUS

## 2023-07-15 MED ORDER — SODIUM CHLORIDE 0.9 % IV SOLN: 250.0000 mL | INTRAVENOUS | Status: DC | PRN

## 2023-07-15 MED ORDER — IOHEXOL 350 MG/ML SOLN
INTRAVENOUS | Status: DC | PRN
  Administered 2023-07-15: 150 mL

## 2023-07-15 MED ORDER — SODIUM CHLORIDE 0.9% FLUSH: 3.0000 mL | Freq: Two times a day (BID) | INTRAVENOUS | Status: DC

## 2023-07-15 MED ORDER — FENTANYL CITRATE (PF) 100 MCG/2ML IJ SOLN
INTRAMUSCULAR | Status: AC
  Filled 2023-07-15: qty 2

## 2023-07-15 MED ORDER — MIDAZOLAM HCL 2 MG/2ML IJ SOLN
INTRAMUSCULAR | Status: DC | PRN
  Administered 2023-07-15: 2 mg via INTRAVENOUS

## 2023-07-15 MED ORDER — SODIUM CHLORIDE 0.9% FLUSH: 3.0000 mL | INTRAVENOUS | Status: DC | PRN

## 2023-07-15 MED ORDER — NITROGLYCERIN 1 MG/10 ML FOR IR/CATH LAB
INTRA_ARTERIAL | Status: AC
  Filled 2023-07-15: qty 10

## 2023-07-15 MED ORDER — HEPARIN SODIUM (PORCINE) 1000 UNIT/ML IJ SOLN
INTRAMUSCULAR | Status: AC
  Filled 2023-07-15: qty 10

## 2023-07-15 MED ORDER — SODIUM CHLORIDE 0.9 % WEIGHT BASED INFUSION: 3.0000 mL/kg/h | INTRAVENOUS | Status: AC

## 2023-07-15 MED ORDER — SODIUM CHLORIDE 0.9 % WEIGHT BASED INFUSION: 3.0000 mL/kg/h | INTRAVENOUS | Status: DC

## 2023-07-15 MED ORDER — LIDOCAINE HCL (PF) 1 % IJ SOLN
INTRAMUSCULAR | Status: AC
  Filled 2023-07-15: qty 30

## 2023-07-15 MED ORDER — FENTANYL CITRATE (PF) 100 MCG/2ML IJ SOLN
INTRAMUSCULAR | Status: DC | PRN
  Administered 2023-07-15: 25 ug via INTRAVENOUS

## 2023-07-15 MED ORDER — VERAPAMIL HCL 2.5 MG/ML IV SOLN
INTRAVENOUS | Status: AC
  Filled 2023-07-15: qty 2

## 2023-07-15 MED ORDER — SODIUM CHLORIDE 0.9 % WEIGHT BASED INFUSION
1.0000 mL/kg/h | INTRAVENOUS | Status: DC
  Administered 2023-07-15: 1 mL/kg/h via INTRAVENOUS

## 2023-07-15 MED ORDER — SODIUM CHLORIDE 0.9 % WEIGHT BASED INFUSION: 1.0000 mL/kg/h | INTRAVENOUS | Status: DC

## 2023-07-15 MED ORDER — VERAPAMIL HCL 2.5 MG/ML IV SOLN
INTRAVENOUS | Status: DC | PRN
  Administered 2023-07-15: 10 mL via INTRA_ARTERIAL

## 2023-07-15 MED ORDER — HEPARIN (PORCINE) IN NACL 1000-0.9 UT/500ML-% IV SOLN
INTRAVENOUS | Status: DC | PRN
  Administered 2023-07-15: 1000 mL via SURGICAL_CAVITY

## 2023-07-15 MED ORDER — HYDRALAZINE HCL 20 MG/ML IJ SOLN: 10.0000 mg | INTRAMUSCULAR | Status: DC | PRN

## 2023-07-15 SURGICAL SUPPLY — 24 items
BALL SAPPHIRE NC24 2.50X12 (BALLOONS) ×1 IMPLANT
BALL SAPPHIRE NC24 4.5X8 (BALLOONS) ×1 IMPLANT
BALLN EMERGE MR 2.0X12 (BALLOONS) ×1 IMPLANT
BALLN ~~LOC~~ EMERGE MR 2.75X8 (BALLOONS) ×1 IMPLANT
BALLOON EMERGE MR 2.0X12 (BALLOONS) IMPLANT
BALLOON SAPPHIRE NC24 2.50X12 (BALLOONS) IMPLANT
BALLOON SAPPHIRE NC24 4.5X8 (BALLOONS) IMPLANT
BALLOON ~~LOC~~ EMERGE MR 2.75X8 (BALLOONS) IMPLANT
CATH SHOCKWAVE C2 4.0X12 (CATHETERS) IMPLANT
CATH VISTA GUIDE 6FR XB3.5 EPK (CATHETERS) IMPLANT
DEVICE RAD COMP TR BAND LRG (VASCULAR PRODUCTS) IMPLANT
GLIDESHEATH SLEND SS 6F .021 (SHEATH) IMPLANT
GUIDEWIRE INQWIRE 1.5J.035X260 (WIRE) IMPLANT
INQWIRE 1.5J .035X260CM (WIRE) ×1 IMPLANT
KIT ENCORE 26 ADVANTAGE (KITS) IMPLANT
PACK CARDIAC CATHETERIZATION (CUSTOM PROCEDURE TRAY) ×1 IMPLANT
SET ATX-X65L (MISCELLANEOUS) IMPLANT
SHEATH PROBE COVER 6X72 (BAG) IMPLANT
STENT ONYX FRONTIER 2.5X12 (Permanent Stent) IMPLANT
STENT SYNERGY XD 4.0X12 (Permanent Stent) IMPLANT
SYNERGY XD 4.0X12 (Permanent Stent) ×1 IMPLANT
TUBING CIL FLEX 10 FLL-RA (TUBING) IMPLANT
WIRE ASAHI PROWATER 180CM (WIRE) IMPLANT
WIRE HI TORQ WHISPER MS 190CM (WIRE) IMPLANT

## 2023-07-15 NOTE — Discharge Summary (Signed)
 Discharge Summary    Patient ID: [name] [name] MRN: [id number]; DOB: Mar 02, 1956  Admit date: 07/14/2023 Discharge date: 07/15/2023  PCP:  [name] Pierini, FNP   [facility] Providers Cardiologist:  [name] Dell, MD   {  Discharge Diagnoses    Principal Problem:   Exertional dyspnea Active Problems:   Essential hypertension, benign   Hyperlipidemia associated with type 2 diabetes mellitus (HCC)   S/P CABG x 4    Diagnostic Studies/Procedures    Left heart catheterization with intervention 07/15/2023   Prox RCA lesion is 80% stenosed.   Dist LM to Prox LAD lesion is 100% stenosed.   Dist LAD lesion is 60% stenosed.   Mid LAD-1 lesion is 20% stenosed with 100% stenosed side branch in 2nd Diag.   Prox LAD to Mid LAD lesion is 20% stenosed.   Ost Cx to Prox Cx lesion is 70% stenosed.   Origin to Prox Graft lesion is 100% stenosed.   Origin to Prox Graft lesion before Ramus  is 100% stenosed.   1st Mrg lesion is 85% stenosed.   1st LPL-2 lesion is 80% stenosed.   LPAV-1 lesion is 50% stenosed with 90% stenosed side branch in 1st LPL.   LPAV-2 lesion is 100% stenosed.   Non-stenotic Mid LAD-2 lesion was previously treated.   Non-stenotic 1st LPL-1 lesion was previously treated.   Non-stenotic Ost LM to Mid LM lesion.   A drug-eluting stent was successfully placed using a STENT ONYX FRONTIER 2.5X12.   A drug-eluting stent was successfully placed using a SYNERGY XD 4.0X12.   A stent was successfully placed.   Post intervention, there is a 100% residual stenosis.   Post intervention, there is a 0% residual stenosis.   Post intervention, there is a 0% residual stenosis.   Post intervention, there is a 0% residual stenosis.   Post intervention, the side branch was reduced to 0% residual stenosis.   LIMA and is normal in caliber.   SVG due to known occlusion.   Seq SVG- SVG-OM1/RI-OM 3 due to known occlusion.   The graft exhibits no disease.   Recommend  uninterrupted dual antiplatelet therapy with Aspirin 81mg  daily and Prasugrel 10mg  daily for a minimum of 12 months (ACS-Class I recommendation).   Successful PCI of the third OM with DES Successful PCI of the ostial LCX with DES Unsuccessful PCI of the distal LCx. This is occluded. Unable to cross with a balloon   Plan: anticipate same day DC. DAPT for one year  Echocardiogram 07/14/2023 1. Left ventricular ejection fraction, by estimation, is 55 to 60%. The  left ventricle has normal function.   2. Right ventricular systolic function is normal.   3. No clear evidence of pericardial effusion though technically difficult  study.   Right left heart catheterization 07/14/2023 Prox LAD lesion is 100% stenosed.  Prox LAD to Mid LAD stent is 20% stenosed. Mid LAD-1 lesion is 20% stenosed with 100% stenosed side branch in 2nd Diag.  There is retrograde filling to the occlusion from the LIMA graft.   LIMA-dLAD graft was visualized by angiography and is normal in caliber. The graft exhibits no disease.   Dist LAD lesion is 60% stenosed beyond graft insertion.   Ost Cx to Prox Cx lesion is 70% stenosed.   1st Mrg lesion is 85% stenosed.   LPAV-1 lesion is 50% stenosed with 90% stenosed side branch in 1st LPL. 1st LPL-1 stent is widely patent. 1st LPL-2 lesion is 80% stenosed.  LPAV-2 lesion is 95% stenosed.   Prox RCA lesion is 80% stenosed.   --------------------------------------------   -----------------Grafts: Patent LIMA to LAD but otherwise occluded vein graft.--------------   SVG-1st Diag graft was not visualized due to known occlusion. Origin to Prox Graft lesion is 100% stenosed.   Seq SVG- SVG-OM1/RI-OM 3 graft was not visualized due to occlusion. Origin to Prox Graft lesion before Ramus is 100% stenosed.   --------------------------------------------   Hemodynamic findings consistent with borderline pulmonary hypertension.   There is no aortic valve stenosis.       Multivessel  Disease: -100% flush occlusion ostial LAD => fills via LIMA to distal LAD with retrograde filling back up to most of the vessel, but occluded SVG-diagonal - Ostial LCx focal 70% stenosis followed by major Lateral Marginal (PL) with ostial 70% and then known subtotal occlusion of the distal AVG LCx to the PDA - Small nondominant RCA with mid 80% stenosis-stable   Grafts: Patent LIMA to LAD but flush occlusion of SVG-D1 and SVG-OM1-OM3. RHC: RA mean 11 mmHg, RV-EDP 37/1-10 mmHg; PAP-mean 32/13-21 mmHg; PCP mean 18 mmHg. Ao sat 93%, PA sat 63%. Fick CO-CI 5.71-2.55  Arterial hemodynamics: LVEDP-EDP 142/9-18 mmHg; ALP-MAP 136/67-99 mmHg.     RECOMMENDATION   In the absence of any other complications or medical issues, we expect the patient to be ready for discharge from a cath perspective on 07/14/2023.   Plan will be for staged PCI to the circumflex and multiple locations on Monday morning 07/19/2023: Will be scheduled for first case.   Recommend uninterrupted dual antiplatelet therapy with Prasugrel 10mg  daily for a minimum of 6 months (stable ischemic heart disease-Class I recommendation).   Load with 60 mg Effient and will be discharged on 10 mg daily. With plan for staged PCI to the LCx in multiple locations, with plan to continue thienopyridine coverage beyond 6 months, at 6 months could convert to complete available monotherapy for long-term management.   _____________   History of Present Illness     [name] [name] is a 68 y.o. male with CAD (s/p CABG 2022), hyperlipidemia, hypertension, DM type II, obesity.  [name] has significant CAD history. He received drug-eluting stent to the LAD/diagonal bifurcation. In 2021 he received drug-eluting stent to the ostial LAD and drug-eluting stent to the posterolateral in 2021, ultimately underwent CABG in May 2022 with SVG-diagonal, LIMA-LAD, SVG-OM1 and OM 3. Patient reported chronic chest pain at OV in January of this year, Imdur increased to  30mg  daily. He again reported exertional chest pain at OV on 06/29/23 with Dr. Diona [name] and Imdur again increased, 30mg  BID. He also underwent Myoview stress test that showed ischemia in lateral and inferoapical walls. Close follow up arranged and patient seen on 07/05/23 reporting no improvement in symptoms. Given persistent symptoms, cardiac catheterization was arranged.    Patient arrived on 04/16 for outpatient LHC with Dr. Herbie Baltimore. Patient found with 100% flush occlusion ostial LAD and more significantly, ostial Lcx with focal 70% stenosis followed by major Lateral Marginal (PL) with ostial 70% and then known subtotal occlusion of the distal AVG LCx to the PDA. Per Dr. Herbie Baltimore, "plan for staged PCI to the left circumflex and multiple locations on Monday morning." Patient loaded on Effient. Shortly after departing the hospital today, patient and spouse stopped at [facility] for lunch. Patient reports overall small portion but heavier food, patty melt and pie. Immediately following this meal, patient had recurrence of mild chest pressure and more notably exertional dyspnea.  Due to these symptoms and proximity to diagnostic LHC, patient returned to the ED.    ED ECG without acute changes from recent tracings, no acute ischemia, stable infero-lateral TWI. ED provider with reasonable suspicion for possible reflux following a heavy meal gave Maalox. On exam, patient reports improving symptoms, says the spells of dyspnea are not lasting as long. Clarifies that symptoms this afternoon not exactly the same as what he felt prior to [facility]. He is not having any chest pressure, palpitations, lightheadedness or dizziness. Patient does report a history of GERD but denies any similar of today's symptoms with what he typically experiences.    Admitted to expedite staged procedure.   Hospital Course     Consultants:    Multivessel CAD Post CABG in May 2022 (LIMA to LAD, SVG to diagonal, SVG to OM1 and OM)  Patient  has been reporting worsening chest pain with DOE despite titration of medical therapy.  Underwent left heart catheterization that demonstrated multivessel disease, initially planned for staged PCI of the LCx and other locations but due to recurrent episode of chest pain she returned back to emergency room.  Underwent staged procedure today with successful PCI of the third OM, ostial CX.  Had unsuccessful PCI of the distal LCx due to occlusion and inability to cross with a balloon.  Please see official cath report to view extensive cath report. Continue with DAPT with aspirin and Effient 10 mg for at least 1 year, atorvastatin 80 mg, carvedilol 6.25 mg, Imdur 30 mg twice daily Will ensure patient has nitroglycerin at home Right heart cath with slightly elevated right filling pressures, preserved cardiac output.  Hyperlipidemia LDL is well-controlled, 39 this admission. Continue statin.  Hypertension Needs to bring blood pressure log at upcoming appointment Continue with carvedilol 6.25 mg twice daily, lisinopril 5 mg daily  Type 2 diabetes A1c 7.3%. Resume home medications, metformin 500 mg can be started 48 hours post catheterization  Patient seen and examined by myself and Dr. [place] and deemed stable for discharge.  Follow-up has been arranged.  Cath site free of any acute complications.  He will discharge home according to protocol.   Did the patient have an acute coronary syndrome (MI, NSTEMI, STEMI, etc) this admission?:  No                               Did the patient have a percutaneous coronary intervention (stent / angioplasty)?:  Yes.     Cath/PCI Registry Performance & Quality Measures: Aspirin prescribed? - Yes ADP Receptor Inhibitor (Plavix/Clopidogrel, Brilinta/Ticagrelor or Effient/Prasugrel) prescribed (includes medically managed patients)? - Yes High Intensity Statin (Lipitor 40-80mg  or Crestor 20-40mg ) prescribed? - Yes For EF <40%, was ACEI/ARB prescribed? - Not  Applicable (EF >/= 40%) For EF <40%, Aldosterone Antagonist (Spironolactone or Eplerenone) prescribed? - Not Applicable (EF >/= 40%) Cardiac Rehab Phase II ordered? - Yes    _____________  Discharge Vitals Blood pressure 125/64, pulse 84, temperature 98.4 F (36.9 C), temperature source Oral, resp. rate (!) 21, height 5\' 8"  (1.727 m), weight 113.4 kg, SpO2 93%.  Filed Weights   07/14/23 1358  Weight: 113.4 kg    Labs & Radiologic Studies    CBC Recent Labs    07/14/23 1401 07/15/23 0500  WBC 5.5 7.7  HGB 12.7* 12.9*  HCT 41.1 41.1  MCV 87.4 86.5  PLT 242 218   Basic Metabolic Panel Recent Labs    16/10/96  1401 07/15/23 0500  NA 134* 135  K 4.5 4.4  CL 98 103  CO2 24 24  GLUCOSE 344* 241*  BUN 23 20  CREATININE 1.05 1.00  CALCIUM 9.1 9.0   Liver Function Tests No results for input(s): "AST", "ALT", "ALKPHOS", "BILITOT", "PROT", "ALBUMIN" in the last 72 hours. No results for input(s): "LIPASE", "AMYLASE" in the last 72 hours. High Sensitivity Troponin:   Recent Labs  Lab 07/14/23 1401 07/14/23 1826  TROPONINIHS 28* 25*    BNP Invalid input(s): "POCBNP" D-Dimer No results for input(s): "DDIMER" in the last 72 hours. Hemoglobin A1C Recent Labs    07/14/23 1553  HGBA1C 7.3*   Fasting Lipid Panel Recent Labs    07/15/23 0500  CHOL 82  HDL 22*  LDLCALC 39  TRIG 604  CHOLHDL 3.7   Thyroid Function Tests No results for input(s): "TSH", "T4TOTAL", "T3FREE", "THYROIDAB" in the last 72 hours.  Invalid input(s): "FREET3" _____________  CARDIAC CATHETERIZATION Result Date: 07/15/2023   Prox RCA lesion is 80% stenosed.   Dist LM to Prox LAD lesion is 100% stenosed.   Dist LAD lesion is 60% stenosed.   Mid LAD-1 lesion is 20% stenosed with 100% stenosed side branch in 2nd Diag.   Prox LAD to Mid LAD lesion is 20% stenosed.   Ost Cx to Prox Cx lesion is 70% stenosed.   Origin to Prox Graft lesion is 100% stenosed.   Origin to Prox Graft lesion before Ramus   is 100% stenosed.   1st Mrg lesion is 85% stenosed.   1st LPL-2 lesion is 80% stenosed.   LPAV-1 lesion is 50% stenosed with 90% stenosed side branch in 1st LPL.   LPAV-2 lesion is 100% stenosed.   Non-stenotic Mid LAD-2 lesion was previously treated.   Non-stenotic 1st LPL-1 lesion was previously treated.   Non-stenotic Ost LM to Mid LM lesion.   A drug-eluting stent was successfully placed using a STENT ONYX FRONTIER 2.5X12.   A drug-eluting stent was successfully placed using a SYNERGY XD 4.0X12.   A stent was successfully placed.   Post intervention, there is a 100% residual stenosis.   Post intervention, there is a 0% residual stenosis.   Post intervention, there is a 0% residual stenosis.   Post intervention, there is a 0% residual stenosis.   Post intervention, the side branch was reduced to 0% residual stenosis.   LIMA and is normal in caliber.   SVG due to known occlusion.   Seq SVG- SVG-OM1/RI-OM 3 due to known occlusion.   The graft exhibits no disease.   Recommend uninterrupted dual antiplatelet therapy with Aspirin 81mg  daily and Prasugrel 10mg  daily for a minimum of 12 months (ACS-Class I recommendation). Successful PCI of the third OM with DES Successful PCI of the ostial LCX with DES Unsuccessful PCI of the distal LCx. This is occluded. Unable to cross with a balloon Plan: anticipate same day DC. DAPT for one year   DG Chest Port 1 View Result Date: 07/14/2023 CLINICAL DATA:  dyspnea EXAM: PORTABLE CHEST 1 VIEW COMPARISON:  November 28, 2020 FINDINGS: Mild cardiomegaly. Sternotomy wires. Resolution of the left pleural effusion. No focal airspace consolidation, pleural effusion, or pneumothorax. Multilevel thoracic osteophytosis. IMPRESSION: Mild cardiomegaly.  Otherwise, no acute cardiopulmonary abnormality. Electronically Signed   By: Wallie Char M.D.   On: 07/14/2023 18:28   ECHOCARDIOGRAM LIMITED Result Date: 07/14/2023    ECHOCARDIOGRAM LIMITED REPORT   Patient Name:   [name] [name]  Date of  Exam: 07/14/2023 Medical Rec #:  [id number]      Height:       68.0 in Accession #:    [id number]     Weight:       250.0 lb Date of Birth:  [birth year]      BSA:          2.247 m Patient Age:    68 years       BP:           157/81 mmHg Patient Gender: M              HR:           81 bpm. Exam Location:  Inpatient Procedure: 2D Echo Indications:    Dyspnea  History:        Patient has prior history of Echocardiogram examinations, most                 recent 08/16/2020. CAD, Prior CABG; Risk Factors:Dyslipidemia,                 Diabetes and Hypertension.  Sonographer:    Astrid Blamer Referring Phys: [id number] Leala Prince IMPRESSIONS  1. Left ventricular ejection fraction, by estimation, is 55 to 60%. The left ventricle has normal function.  2. Right ventricular systolic function is normal.  3. No clear evidence of pericardial effusion though technically difficult study. FINDINGS  Left Ventricle: Left ventricular ejection fraction, by estimation, is 55 to 60%. The left ventricle has normal function. Right Ventricle: Right ventricular systolic function is normal. Pericardium: No clear evidence of pericardial effusion though technically difficult study. There is no evidence of pericardial effusion. Venous: The inferior vena cava was not well visualized. Arta Lark Electronically signed by Arta Lark Signature Date/Time: 07/14/2023/4:43:02 PM    Final    CARDIAC CATHETERIZATION Result Date: 07/14/2023   Prox LAD lesion is 100% stenosed.  Prox LAD to Mid LAD stent is 20% stenosed. Mid LAD-1 lesion is 20% stenosed with 100% stenosed side branch in 2nd Diag.  There is retrograde filling to the occlusion from the LIMA graft.   LIMA-dLAD graft was visualized by angiography and is normal in caliber. The graft exhibits no disease.   Dist LAD lesion is 60% stenosed beyond graft insertion.   Ost Cx to Prox Cx lesion is 70% stenosed.   1st Mrg lesion is 85% stenosed.   LPAV-1 lesion is 50% stenosed with 90%  stenosed side branch in 1st LPL. 1st LPL-1 stent is widely patent. 1st LPL-2 lesion is 80% stenosed.   LPAV-2 lesion is 95% stenosed.   Prox RCA lesion is 80% stenosed.   --------------------------------------------   -----------------Grafts: Patent LIMA to LAD but otherwise occluded vein graft.--------------   SVG-1st Diag graft was not visualized due to known occlusion. Origin to Prox Graft lesion is 100% stenosed.   Seq SVG- SVG-OM1/RI-OM 3 graft was not visualized due to occlusion. Origin to Prox Graft lesion before Ramus is 100% stenosed.   --------------------------------------------   Hemodynamic findings consistent with borderline pulmonary hypertension.   There is no aortic valve stenosis. Multivessel Disease: -100% flush occlusion ostial LAD => fills via LIMA to distal LAD with retrograde filling back up to most of the vessel, but occluded SVG-diagonal - Ostial LCx focal 70% stenosis followed by major Lateral Marginal (PL) with ostial 70% and then known subtotal occlusion of the distal AVG LCx to the PDA - Small nondominant RCA with mid 80% stenosis-stable Grafts: Patent LIMA to LAD but flush  occlusion of SVG-D1 and SVG-OM1-OM3. RHC: RA mean 11 mmHg, RV-EDP 37/1-10 mmHg; PAP-mean 32/13-21 mmHg; PCP mean 18 mmHg. Ao sat 93%, PA sat 63%. Fick CO-CI 5.71-2.55 Arterial hemodynamics: LVEDP-EDP 142/9-18 mmHg; ALP-MAP 136/67-99 mmHg. RECOMMENDATION   In the absence of any other complications or medical issues, we expect the patient to be ready for discharge from a cath perspective on 07/14/2023.   Plan will be for staged PCI to the circumflex and multiple locations on Monday morning 07/19/2023: Will be scheduled for first case.   Recommend uninterrupted dual antiplatelet therapy with Prasugrel 10mg  daily for a minimum of 6 months (stable ischemic heart disease-Class I recommendation).   Load with 60 mg Effient and will be discharged on 10 mg daily. With plan for staged PCI to the LCx in multiple locations, with  plan to continue thienopyridine coverage beyond 6 months, at 6 months could convert to complete available monotherapy for long-term management. Bryan Lemma, MD  NM Myocar Multi W/Spect Izetta Dakin Motion / EF Result Date: 07/01/2023   Findings are consistent with large area of  ischemia in both the lateral and inferior/inferoapical walls. The study is high risk.   No ST deviation was noted.   LV perfusion is abnormal. Large moderate intensity lateral defect nearly completely reversible. Large moderate intensity inferior/infero apical defect nearly completely reversible. SSS 18 SRS 4 SDS 14.   Left ventricular function is abnormal. Nuclear stress EF: 43%. The left ventricular ejection fraction is moderately decreased (30-44%). End diastolic cavity size is normal.   Disposition   Pt is being discharged home today in good condition.  Follow-up Plans & Appointments     Follow-up Information     Sharlene Dory, NP Follow up.   Specialty: Cardiology Why: 08/06/2023 at 1:30 Contact information: [address] [place] [address] [phone]                Discharge Instructions     Amb Referral to Cardiac Rehabilitation   Complete by: As directed    Diagnosis: Coronary Stents   After initial evaluation and assessments completed: Virtual Based Care may be provided alone or in conjunction with Phase 2 Cardiac Rehab based on patient barriers.: Yes   Intensive Cardiac Rehabilitation (ICR) [facility] location only OR Traditional Cardiac Rehabilitation (TCR) *If criteria for ICR are not met will enroll in TCR [facility] only): Yes   Diet - low sodium heart healthy   Complete by: As directed    Discharge instructions   Complete by: As directed    Radial Site Care Refer to this sheet in the next few weeks. These instructions provide you with information on caring for yourself after your procedure. Your caregiver may also give you more specific instructions. Your treatment has been planned according  to current medical practices, but problems sometimes occur. Call your caregiver if you have any problems or questions after your procedure. HOME CARE INSTRUCTIONS You may shower the day after the procedure. Remove the bandage (dressing) and gently wash the site with plain soap and water. Gently pat the site dry.  Do not apply powder or lotion to the site.  Do not submerge the affected site in water for 3 to 5 days.  Inspect the site at least twice daily.  Do not flex or bend the affected arm for 24 hours.  No lifting over 5 pounds (2.3 kg) for 5 days after your procedure.  Do not drive home if you are discharged the same day of the  procedure. Have someone else drive you.  You may drive 24 hours after the procedure unless otherwise instructed by your caregiver.  What to expect: Any bruising will usually fade within 1 to 2 weeks.  Blood that collects in the tissue (hematoma) may be painful to the touch. It should usually decrease in size and tenderness within 1 to 2 weeks.  SEEK IMMEDIATE MEDICAL CARE IF: You have unusual pain at the radial site.  You have redness, warmth, swelling, or pain at the radial site.  You have drainage (other than a small amount of blood on the dressing).  You have chills.  You have a fever or persistent symptoms for more than 72 hours.  You have a fever and your symptoms suddenly get worse.  Your arm becomes pale, cool, tingly, or numb.  You have heavy bleeding from the site. Hold pressure on the site.   Increase activity slowly   Complete by: As directed         Discharge Medications   Allergies as of 07/15/2023   No Known Allergies      Medication List     PAUSE taking these medications    metFORMIN 500 MG tablet Wait to take this until: July 18, 2023 Commonly known as: GLUCOPHAGE Take 2 tablets by mouth twice daily       TAKE these medications    Accu-Chek Aviva Plus test strip Generic drug: glucose blood CHECK BLOOD SUGAR UP TO 4  TIMES A DAY.Dx E11.8   Accu-Chek Softclix Lancets lancets CHECK BLOOD SUGAR UP TO 4 TIMES A DAY.Dx E11.8   aspirin EC 81 MG tablet Take 1 tablet (81 mg total) by mouth daily. Swallow whole.   atorvastatin 80 MG tablet Commonly known as: LIPITOR Take 1 tablet (80 mg total) by mouth daily.   blood glucose meter kit and supplies Dispense based on patient and insurance preference. Use up to four times daily as directed. (FOR ICD-10 E10.9, E11.9).   carvedilol 6.25 MG tablet Commonly known as: COREG Take 1 tablet (6.25 mg total) by mouth 2 (two) times daily.   CINNAMON PO Take 1,000 mg by mouth 2 (two) times daily.   CRANBERRY PO Take 4,200 mg by mouth 2 (two) times daily.   FreeStyle Libre 3 Plus Sensor Misc CHANGE SENSOR EVERY 15 DAYS   furosemide 20 MG tablet Commonly known as: LASIX TAKE 1 TABLET BY MOUTH ONCE DAILY AS NEEDED FOR  LEG  SWELLING. What changed: See the new instructions.   insulin glargine 100 UNIT/ML injection Commonly known as: LANTUS Inject 48-78 Units into the skin See admin instructions. Inject 48 units SQ every morning & then take 78 units SQ at night per patient   Insulin Pen Needle 32G X 4 MM Misc 1 each by Does not apply route daily.   B-D ULTRAFINE III SHORT PEN 31G X 8 MM Misc Generic drug: Insulin Pen Needle USE 1 PEN NEEDLE UP TO 8 TIMES DAILY AS NEEDED WITH INSULIN Dx E11.8   isosorbide mononitrate 30 MG 24 hr tablet Commonly known as: IMDUR Take 1 tablet (30 mg total) by mouth in the morning and at bedtime.   lisinopril 5 MG tablet Commonly known as: ZESTRIL Take 1 tablet (5 mg total) by mouth daily.   loratadine 10 MG tablet Commonly known as: CLARITIN Take 10 mg by mouth daily.   nitroGLYCERIN 0.4 MG SL tablet Commonly known as: NITROSTAT Place 1 tablet (0.4 mg total) under the tongue every 5 (five) minutes  x 3 doses as needed for chest pain (if no relief after 2nd dose, proceed to ED or call 911).   NovoLOG FlexPen 100 UNIT/ML  FlexPen Generic drug: insulin aspart INJECT 48 TO 78 UNITS SUBCUTANEOUSLY TWICE DAILY PER SLIDING SCALE What changed: See the new instructions.   Ozempic (2 MG/DOSE) 8 MG/3ML Sopn Generic drug: Semaglutide (2 MG/DOSE) Inject 2 mg into the skin once a week.   pantoprazole 40 MG tablet Commonly known as: PROTONIX Take 1 tablet (40 mg total) by mouth daily as needed (for acid reflux).   prasugrel 10 MG Tabs tablet Commonly known as: Effient Take 1 tablet (10 mg total) by mouth daily.   tamsulosin 0.4 MG Caps capsule Commonly known as: FLOMAX Take 1 capsule (0.4 mg total) by mouth at bedtime.           Outstanding Labs/Studies    Duration of Discharge Encounter: APP Time: 30 minutes   Signed, Burnetta Cart, PA-C 07/15/2023, 3:14 PM

## 2023-07-15 NOTE — Interval H&P Note (Signed)
 History and Physical Interval Note:  07/15/2023 9:45 AM  [name] [name]  has presented today for surgery, with the diagnosis of cad.  The various methods of treatment have been discussed with the patient and family. After consideration of risks, benefits and other options for treatment, the patient has consented to  Procedure(s): CORONARY STENT INTERVENTION (N/A) as a surgical intervention.  The patient's history has been reviewed, patient examined, no change in status, stable for surgery.  I have reviewed the patient's chart and labs.  Questions were answered to the patient's satisfaction.   Cath Lab Visit (complete for each Cath Lab visit)  Clinical Evaluation Leading to the Procedure:   ACS: Yes.    Non-ACS:    Anginal Classification: CCS IV  Anti-ischemic medical therapy: Maximal Therapy (2 or more classes of medications)  Non-Invasive Test Results: No non-invasive testing performed  Prior CABG: Previous CABG        Donata Fryer [facility] 07/15/2023 9:45 AM

## 2023-07-15 NOTE — Progress Notes (Signed)
 Assumed care of pt at this time.

## 2023-07-15 NOTE — Progress Notes (Signed)
 CARDIAC REHAB PHASE I     Post stent education including site care, restrictions, risk factors, exercise guidelines, NTG use, antiplatelet therapy importance, heart healthy diabetic diet and CRP2 reviewed. All questions and concerns addressed. Will refer to AP for CRP2. Plan for home later today.     Ronny Colas, RN BSN 07/15/2023 2:49 PM

## 2023-07-15 NOTE — Discharge Instructions (Signed)

## 2023-07-16 ENCOUNTER — Encounter (HOSPITAL_COMMUNITY): Payer: Self-pay | Admitting: Cardiology

## 2023-07-16 ENCOUNTER — Other Ambulatory Visit: Payer: Self-pay | Admitting: Nurse Practitioner

## 2023-07-16 DIAGNOSIS — E118 Type 2 diabetes mellitus with unspecified complications: Secondary | ICD-10-CM

## 2023-07-16 LAB — MISC LABCORP TEST (SEND OUT): Labcorp test code: 83935

## 2023-07-16 LAB — LIPOPROTEIN A (LPA): Lipoprotein (a): 17.3 nmol/L (ref <75.0)

## 2023-07-16 MED FILL — Nitroglycerin IV Soln 100 MCG/ML in D5W: INTRA_ARTERIAL | Qty: 10 | Status: AC

## 2023-07-17 ENCOUNTER — Other Ambulatory Visit: Payer: Self-pay

## 2023-07-17 ENCOUNTER — Encounter (HOSPITAL_COMMUNITY): Payer: Self-pay

## 2023-07-17 ENCOUNTER — Emergency Department (HOSPITAL_COMMUNITY)
Admission: EM | Admit: 2023-07-17 | Discharge: 2023-07-17 | Disposition: A | Discharge status: Home / Self Care | Attending: Emergency Medicine | Admitting: Emergency Medicine

## 2023-07-17 ENCOUNTER — Emergency Department (HOSPITAL_COMMUNITY)

## 2023-07-17 DIAGNOSIS — M7989 Other specified soft tissue disorders: Secondary | ICD-10-CM

## 2023-07-17 DIAGNOSIS — I2510 Atherosclerotic heart disease of native coronary artery without angina pectoris: Secondary | ICD-10-CM | POA: Insufficient documentation

## 2023-07-17 DIAGNOSIS — R609 Edema, unspecified: Secondary | ICD-10-CM | POA: Diagnosis not present

## 2023-07-17 DIAGNOSIS — S60211A Contusion of right wrist, initial encounter: Secondary | ICD-10-CM | POA: Diagnosis not present

## 2023-07-17 DIAGNOSIS — Z7982 Long term (current) use of aspirin: Secondary | ICD-10-CM | POA: Insufficient documentation

## 2023-07-17 DIAGNOSIS — Z794 Long term (current) use of insulin: Secondary | ICD-10-CM | POA: Insufficient documentation

## 2023-07-17 DIAGNOSIS — X58XXXA Exposure to other specified factors, initial encounter: Secondary | ICD-10-CM | POA: Insufficient documentation

## 2023-07-17 DIAGNOSIS — R2231 Localized swelling, mass and lump, right upper limb: Secondary | ICD-10-CM | POA: Diagnosis not present

## 2023-07-17 NOTE — ED Provider Notes (Signed)
 [place] EMERGENCY DEPARTMENT AT [facility] Provider Note   CSN: [id number] Arrival date & time: 07/17/23  1437     History  Chief Complaint  Patient presents with   Arm Swelling    [name] [name] is a 68 y.o. male.  Patient here for some swelling and discomfort of his right radial artery site.  He had a heart catheterization there 2 days ago.  He took the wrapping off and noticed some swelling and discomfort there.  He actually had a left approach as well did not notice that there was that type of swelling and bruising on that side.  He states that swelling has not really changed at all and has been stable here all day today.  She has noticed some discomfort and bruising and thought it should get evaluated.  He denies any chest pain shortness of breath fever chills.  Nothing makes it worse or better.  The history is provided by the patient.       Home Medications Prior to Admission medications   Medication Sig Start Date End Date Taking? Authorizing Provider  Accu-Chek Softclix Lancets lancets CHECK BLOOD SUGAR UP TO 4 TIMES A DAY.Dx E11.8 08/25/22   Delfina Feller, FNP  aspirin  EC 81 MG tablet Take 1 tablet (81 mg total) by mouth daily. Swallow whole. 03/19/21   Gerard Knight, MD  atorvastatin  (LIPITOR ) 80 MG tablet Take 1 tablet (80 mg total) by mouth daily. 02/22/23   Gaylyn Keas, Mary-Margaret, FNP  B-D ULTRAFINE III SHORT PEN 31G X 8 MM MISC USE 1 PEN NEEDLE UP TO 8 TIMES DAILY AS NEEDED WITH INSULIN  Dx E11.8 04/19/23   Delfina Feller, FNP  blood glucose meter kit and supplies Dispense based on patient and insurance preference. Use up to four times daily as directed. (FOR ICD-10 E10.9, E11.9). 04/26/19   Gaylyn Keas Mary-Margaret, FNP  carvedilol  (COREG ) 6.25 MG tablet Take 1 tablet (6.25 mg total) by mouth 2 (two) times daily. 04/19/23   Gerard Knight, MD  CINNAMON  PO Take 1,000 mg by mouth 2 (two) times daily.    [provider]  Continuous  Glucose Sensor (FREESTYLE LIBRE 3 PLUS SENSOR) MISC CHANGE SENSOR EVERY 15 DAYS 07/05/23   Gaylyn Keas, Mary-Margaret, FNP  CRANBERRY PO Take 4,200 mg by mouth 2 (two) times daily.    [provider]  furosemide  (LASIX ) 20 MG tablet TAKE 1 TABLET BY MOUTH ONCE DAILY AS NEEDED FOR  LEG  SWELLING. Patient taking differently: Take 20 mg by mouth daily. 05/03/23   Gaylyn Keas, Mary-Margaret, FNP  glucose blood (ACCU-CHEK AVIVA PLUS) test strip CHECK BLOOD SUGAR UP TO 4 TIMES A DAY.Dx E11.8 08/25/22   Delfina Feller, FNP  insulin  glargine (LANTUS ) 100 UNIT/ML injection Inject 48-78 Units into the skin See admin instructions. Inject 48 units SQ every morning & then take 78 units SQ at night per patient    [provider]  Insulin  Pen Needle 32G X 4 MM MISC 1 each by Does not apply route daily. 02/13/22   Gaylyn Keas Mary-Margaret, FNP  isosorbide  mononitrate (IMDUR ) 30 MG 24 hr tablet Take 1 tablet (30 mg total) by mouth in the morning and at bedtime. 06/29/23   Gerard Knight, MD  lisinopril  (ZESTRIL ) 5 MG tablet Take 1 tablet (5 mg total) by mouth daily. 06/29/23   Gerard Knight, MD  loratadine  (CLARITIN ) 10 MG tablet Take 10 mg by mouth daily. 02/15/15   [provider]  metFORMIN  (GLUCOPHAGE ) 500 MG tablet  Take 2 tablets by mouth twice daily 05/25/23   Gaylyn Keas, Mary-Margaret, FNP  nitroGLYCERIN  (NITROSTAT ) 0.4 MG SL tablet Place 1 tablet (0.4 mg total) under the tongue every 5 (five) minutes x 3 doses as needed for chest pain (if no relief after 2nd dose, proceed to ED or call 911). 06/29/23   Gerard Knight, MD  NOVOLOG  FLEXPEN 100 UNIT/ML FlexPen INJECT 48 TO 78 UNITS SUBCUTANEOUSLY TWICE DAILY PER SLIDING SCALE Patient taking differently: Inject 48-78 Units into the skin See admin instructions. INJECT 48 TO 78 UNITS SUBCUTANEOUSLY THREE TIMES DAILY PER SLIDING SCALE 07/05/23   Gaylyn Keas, Mary-Margaret, FNP  pantoprazole  (PROTONIX ) 40 MG tablet Take 1 tablet (40 mg total) by mouth daily  as needed (for acid reflux). 02/22/23   Gaylyn Keas Mary-Margaret, FNP  prasugrel  (EFFIENT ) 10 MG TABS tablet Take 1 tablet (10 mg total) by mouth daily. 07/14/23   Arleen Lacer, MD  Semaglutide , 2 MG/DOSE, (OZEMPIC , 2 MG/DOSE,) 8 MG/3ML SOPN Inject 2 mg into the skin once a week. 02/22/23   Gaylyn Keas, Mary-Margaret, FNP  tamsulosin  (FLOMAX ) 0.4 MG CAPS capsule Take 1 capsule (0.4 mg total) by mouth at bedtime. 02/22/23   Delfina Feller, FNP      Allergies    Patient has no known allergies.    Review of Systems   Review of Systems  Physical Exam Updated Vital Signs BP (!) 144/77 (BP Location: Left Arm)   Pulse 77   Temp 98.1 F (36.7 C)   Resp 17   SpO2 96%  Physical Exam Vitals and nursing note reviewed.  Constitutional:      General: He is not in acute distress.    Appearance: He is well-developed. He is not ill-appearing.  HENT:     Head: Normocephalic and atraumatic.     Nose: Nose normal.     Mouth/Throat:     Mouth: Mucous membranes are moist.  Eyes:     Extraocular Movements: Extraocular movements intact.     Conjunctiva/sclera: Conjunctivae normal.     Pupils: Pupils are equal, round, and reactive to light.  Cardiovascular:     Rate and Rhythm: Normal rate and regular rhythm.     Pulses: Normal pulses.     Heart sounds: No murmur heard.    Comments: He has palpable radial pulses above and below his radial artery puncture site Pulmonary:     Effort: Pulmonary effort is normal. No respiratory distress.     Breath sounds: Normal breath sounds.  Abdominal:     Palpations: Abdomen is soft.     Tenderness: There is no abdominal tenderness.  Musculoskeletal:        General: No swelling.     Cervical back: Normal range of motion and neck supple.  Skin:    General: Skin is warm and dry.     Capillary Refill: Capillary refill takes less than 2 seconds.     Comments: He has a little bit of bruising at the right wrist above and below his arterial puncture site,  compartments are soft, is able to flex and extend at the wrist without any issues, there is no major fluctuance or cellulitic process or major hematoma  Neurological:     Mental Status: He is alert.  Psychiatric:        Mood and Affect: Mood normal.     ED Results / Procedures / Treatments   Labs (all labs ordered are listed, but only abnormal results are displayed) Labs Reviewed - No data to  display  EKG None  Radiology DG Forearm Right Result Date: 07/17/2023 CLINICAL DATA:  Arm swelling post catheterization EXAM: RIGHT FOREARM - 2 VIEW COMPARISON:  None Available. FINDINGS: No fracture or malalignment. Edema within the subcutaneous soft tissues. No significant elbow effusion. Mild vascular calcifications. No radiopaque foreign body IMPRESSION: No acute osseous abnormality. Edema within the subcutaneous soft tissues. Electronically Signed   By: Esmeralda Hedge M.D.   On: 07/17/2023 18:29    Procedures Procedures    Medications Ordered in ED Medications - No data to display  ED Course/ Medical Decision Making/ A&P                                 Medical Decision Making  [name] [name] is here for evaluation of bruising to his right radial artery approach site.  He had a heart catheterization done 2 days ago through the right wrist.  He has a history of CAD.  He is on Effient .  Overall he is got strong pulses above and below the arterial puncture site.  He had a little bit of bruising and swelling around this area.  I suspect minor injury from arterial puncture but he is neurovascular neuromuscular intact.  There is no expanding hematoma.  Compartments are soft.  Very fairly mild swelling and bruising.  I do expect that this will improve and this is likely expected bruising and swelling from this procedure.  He has been in the waiting room for about 6 or 7 hours and he has not noticed any change in the swelling or bruising.  Ultimately I think he can continue conservative management  at home with rest ice mild compression.  Told to return if things worsen.  Discharged in good condition.  Understands turn precautions.  This chart was dictated using voice recognition software.  Despite best efforts to proofread,  errors can occur which can change the documentation meaning.         Final Clinical Impression(s) / ED Diagnoses Final diagnoses:  Arm swelling    Rx / DC Orders ED Discharge Orders     None         Lowery Rue, DO 07/17/23 2006

## 2023-07-17 NOTE — Discharge Instructions (Addendum)
 Continue ice, Tylenol , rest.  Return if swelling worsens as we discussed

## 2023-07-17 NOTE — ED Provider Triage Note (Signed)
 Emergency Medicine Provider Triage Evaluation Note  [name] [name] , a 68 y.o. male  was evaluated in triage.  Pt complains of right arm swelling.  Patient reports that he had a heart catheterization performed on Thursday of this week.  States that he noted some swelling worsening primarily today.  States that he was concerned that the bandage was removed prematurely after the heart catheterization which resulted in the swelling.  Denies any chest pain or shortness of breath at this time.  Does report pain at the site of the catheterization.  Some mild bruising seen..  Review of Systems  Positive: As above Negative: As above  Physical Exam  BP (!) 166/76   Pulse 85   Temp (!) 97.5 F (36.4 C)   Resp 17   SpO2 98%  Gen:   Awake, no distress  Resp:  Normal effort  MSK:   Moves extremities without difficulty  Other:  Mild swelling and induration present in the distal right forearm.  No obvious signs of infection such as cellulitis or erythema.  Medical Decision Making  Medically screening exam initiated at 3:22 PM.  Appropriate orders placed.  [name] [name] was informed that the remainder of the evaluation will be completed by another provider, this initial triage assessment does not replace that evaluation, and the importance of remaining in the ED until their evaluation is complete.     Vinita Prentiss A, PA-C 07/17/23 1721

## 2023-07-17 NOTE — ED Triage Notes (Signed)
 Pt states he had a heart cath on Thursday in which they went in his right radial artery. C/o pain and swelling to the site that he noticed yesterday that is worse today.

## 2023-07-19 ENCOUNTER — Telehealth: Payer: Self-pay | Admitting: Family Medicine

## 2023-07-19 NOTE — Telephone Encounter (Signed)
 Copied from CRM [id number]. Topic: Clinical - Prescription Issue >> Jul 16, 2023  9:30 AM Georgeann Kindred wrote: Reason for CRM: Wife called and stated that [facility] sent over a request of authorization for the patient's Novolog  medication due to patient being out of refills. Pharmacy states they have not heard anything back. Mrs. Cato states they are not wanting to go into the weekend without the medication due the patient just getting out of the hospital. Please contact Mrs. Parcher at [phone]

## 2023-07-20 ENCOUNTER — Other Ambulatory Visit (HOSPITAL_COMMUNITY): Payer: Self-pay

## 2023-07-20 ENCOUNTER — Telehealth: Payer: Self-pay

## 2023-07-20 DIAGNOSIS — M79675 Pain in left toe(s): Secondary | ICD-10-CM | POA: Diagnosis not present

## 2023-07-20 DIAGNOSIS — E1142 Type 2 diabetes mellitus with diabetic polyneuropathy: Secondary | ICD-10-CM | POA: Diagnosis not present

## 2023-07-20 DIAGNOSIS — M79674 Pain in right toe(s): Secondary | ICD-10-CM | POA: Diagnosis not present

## 2023-07-20 DIAGNOSIS — B351 Tinea unguium: Secondary | ICD-10-CM | POA: Diagnosis not present

## 2023-07-20 DIAGNOSIS — L84 Corns and callosities: Secondary | ICD-10-CM | POA: Diagnosis not present

## 2023-07-20 NOTE — Telephone Encounter (Signed)
 Called and spoke with spouse. Novolog  was sent to the pharmacy and spouse will be picking it up today

## 2023-07-20 NOTE — Telephone Encounter (Signed)
 Pharmacy Patient Advocate Encounter   Received notification from Pt Calls Messages that prior authorization for NOVOLOG  FLEXPEN is required/requested.   Insurance verification completed.   The patient is insured through [employer] .   Per test claim: Refill too soon. PA is not needed at this time. Medication was filled 07/16/23. Next eligible fill date is 07/23/23.

## 2023-07-20 NOTE — Telephone Encounter (Signed)
 Per test claim: Refill too soon. PA is not needed at this time. Medication was filled 07/16/23. Next eligible fill date is 07/23/23.

## 2023-07-30 ENCOUNTER — Other Ambulatory Visit: Payer: Self-pay | Admitting: Nurse Practitioner

## 2023-07-30 DIAGNOSIS — H43813 Vitreous degeneration, bilateral: Secondary | ICD-10-CM | POA: Diagnosis not present

## 2023-07-30 DIAGNOSIS — E113313 Type 2 diabetes mellitus with moderate nonproliferative diabetic retinopathy with macular edema, bilateral: Secondary | ICD-10-CM | POA: Diagnosis not present

## 2023-07-30 DIAGNOSIS — H35373 Puckering of macula, bilateral: Secondary | ICD-10-CM | POA: Diagnosis not present

## 2023-08-03 ENCOUNTER — Ambulatory Visit: Payer: Medicare PPO

## 2023-08-03 VITALS — BP 122/70 | HR 89 | Ht 68.0 in | Wt 259.0 lb

## 2023-08-03 DIAGNOSIS — Z0000 Encounter for general adult medical examination without abnormal findings: Secondary | ICD-10-CM | POA: Diagnosis not present

## 2023-08-03 NOTE — Progress Notes (Signed)
 Subjective:   [name] [name] is a 68 y.o. who presents for a Medicare Wellness preventive visit.  Visit Complete: Virtual I connected with  [name] [name] on 08/03/23 by a audio enabled telemedicine application and verified that I am speaking with the correct person using two identifiers.  Patient Location: Home  Provider Location: Home Office  I discussed the limitations of evaluation and management by telemedicine. The patient expressed understanding and agreed to proceed.  Vital Signs: Because this visit was a virtual/telehealth visit, some criteria may be missing or patient reported. Any vitals not documented were not able to be obtained and vitals that have been documented are patient reported.  VideoDeclined- This patient declined Librarian, academic. Therefore the visit was completed with audio only.  Persons Participating in Visit: Patient.  AWV Questionnaire: No: Patient Medicare AWV questionnaire was not completed prior to this visit.  Cardiac Risk Factors include: advanced age (>80men, >79 women);male gender;obesity (BMI >30kg/m2);diabetes mellitus;dyslipidemia;hypertension     Objective:    Today's Vitals   08/03/23 1331  BP: 122/70  Pulse: 89  Weight: 259 lb (117.5 kg)  Height: 5\' 8"  (1.727 m)   Body mass index is 39.38 kg/m.     08/03/2023    1:49 PM 07/14/2023    7:59 PM 07/14/2023    7:18 AM 07/29/2022    8:24 AM 10/12/2021    4:41 PM 07/23/2021    8:23 AM 01/16/2021    8:29 AM  Advanced Directives  Does Patient Have a Medical Advance Directive? Yes No No No No Yes No  Type of Dispensing optician of [place];Living will   Copy of Healthcare Power of Attorney in Chart?      No - copy requested   Would patient like information on creating a medical advance directive?  No - Patient declined No - Patient declined No - Patient declined No - Patient declined No - Patient declined No - Patient declined    Current  Medications (verified) Outpatient Encounter Medications as of 08/03/2023  Medication Sig   Accu-Chek Softclix Lancets lancets CHECK BLOOD SUGAR UP TO 4 TIMES A DAY.Dx E11.8   aspirin  EC 81 MG tablet Take 1 tablet (81 mg total) by mouth daily. Swallow whole.   atorvastatin  (LIPITOR ) 80 MG tablet Take 1 tablet (80 mg total) by mouth daily.   B-D ULTRAFINE III SHORT PEN 31G X 8 MM MISC USE 1 PEN NEEDLE UP TO 8 TIMES DAILY AS NEEDED WITH INSULIN  Dx E11.8   blood glucose meter kit and supplies Dispense based on patient and insurance preference. Use up to four times daily as directed. (FOR ICD-10 E10.9, E11.9).   carvedilol  (COREG ) 6.25 MG tablet Take 1 tablet (6.25 mg total) by mouth 2 (two) times daily.   CINNAMON  PO Take 1,000 mg by mouth 2 (two) times daily.   Continuous Glucose Sensor (FREESTYLE LIBRE 3 PLUS SENSOR) MISC CHANGE SENSOR EVERY 15 DAYS   CRANBERRY PO Take 4,200 mg by mouth 2 (two) times daily.   furosemide  (LASIX ) 20 MG tablet TAKE 1 TABLET BY MOUTH ONCE DAILY AS NEEDED FOR  LEG  SWELLING   glucose blood (ACCU-CHEK AVIVA PLUS) test strip CHECK BLOOD SUGAR UP TO 4 TIMES A DAY.Dx E11.8   insulin  glargine (LANTUS ) 100 UNIT/ML injection Inject 48-78 Units into the skin See admin instructions. Inject 48 units SQ every morning & then take 78 units SQ at night per patient  Insulin  Pen Needle 32G X 4 MM MISC 1 each by Does not apply route daily.   isosorbide  mononitrate (IMDUR ) 30 MG 24 hr tablet Take 1 tablet (30 mg total) by mouth in the morning and at bedtime.   lisinopril  (ZESTRIL ) 5 MG tablet Take 1 tablet (5 mg total) by mouth daily.   loratadine  (CLARITIN ) 10 MG tablet Take 10 mg by mouth daily.   metFORMIN  (GLUCOPHAGE ) 500 MG tablet Take 2 tablets by mouth twice daily   nitroGLYCERIN  (NITROSTAT ) 0.4 MG SL tablet Place 1 tablet (0.4 mg total) under the tongue every 5 (five) minutes x 3 doses as needed for chest pain (if no relief after 2nd dose, proceed to ED or call 911).   NOVOLOG   FLEXPEN 100 UNIT/ML FlexPen INJECT 48-78 UNITS SUBCUTANEOUSLY TWICE DAILY PER SLIDING SCALE   pantoprazole  (PROTONIX ) 40 MG tablet Take 1 tablet (40 mg total) by mouth daily as needed (for acid reflux).   prasugrel  (EFFIENT ) 10 MG TABS tablet Take 1 tablet (10 mg total) by mouth daily.   Semaglutide , 2 MG/DOSE, (OZEMPIC , 2 MG/DOSE,) 8 MG/3ML SOPN Inject 2 mg into the skin once a week.   tamsulosin  (FLOMAX ) 0.4 MG CAPS capsule Take 1 capsule (0.4 mg total) by mouth at bedtime.   No facility-administered encounter medications on file as of 08/03/2023.    Allergies (verified) Patient has no known allergies.   History: Past Medical History:  Diagnosis Date   Anxiety    Coronary atherosclerosis of native coronary artery    DES distal circumflex 2005; DES LAD/diagonal bifurcation 09/2010; DES ostial LAD and DES left PL 01/2020; CABG May 2022 (LIMA to LAD, SVG to diagonal, SVG to OM1 and OM 3)   Essential hypertension    GERD (gastroesophageal reflux disease)    History of kidney stones    Mixed hyperlipidemia    Myocardial infarction (HCC)    Anterolateral with VF arrest 7/12   Type 2 diabetes mellitus (HCC)    Past Surgical History:  Procedure Laterality Date   CARDIAC CATHETERIZATION  2012   CAROTID STENT     stents x 2    CHOLECYSTECTOMY N/A 12/04/2015   Procedure: LAPAROSCOPIC CHOLECYSTECTOMY;  Surgeon: Alanda Allegra, MD;  Location: [facility];  Service: General;  Laterality: N/A;   CHOLECYSTECTOMY, LAPAROSCOPIC  12/05/2015   CORONARY ANGIOPLASTY  2012   STENT X 1 2012, STENT X 1 YRS BEFORE   CORONARY ARTERY BYPASS GRAFT N/A 08/21/2020   Procedure: CORONARY ARTERY BYPASS GRAFTING (CABG) X 4, USING LEFT INTERNAL MAMMARY ARTERY AND RIGHT GREATER SAPHENOUS VEIN HARVESTED ENDOSCOPICALLY. SVG TO OM1, OM3 SEQUENTIALLY, SVG TO DIAG., LIMA TO LAD;  Surgeon: Zelphia Higashi, MD;  Location: [facility] OR;  Service: Open Heart Surgery;  Laterality: N/A;   CORONARY ATHERECTOMY N/A 02/09/2020   Procedure:  CORONARY ATHERECTOMY;  Surgeon: Wenona Hamilton, MD;  Location: [facility] INVASIVE CV LAB;  Service: Cardiovascular;  Laterality: N/A;   CORONARY IMAGING/OCT N/A 05/22/2020   Procedure: INTRAVASCULAR IMAGING/OCT;  Surgeon: Lucendia Rusk, MD;  Location: [facility] INVASIVE CV LAB;  Service: Cardiovascular;  Laterality: N/A;   CORONARY LITHOTRIPSY N/A 07/15/2023   Procedure: CORONARY LITHOTRIPSY;  Surgeon: Swaziland, Peter M, MD;  Location: [facility] INVASIVE CV LAB;  Service: Cardiovascular;  Laterality: N/A;   CORONARY STENT INTERVENTION N/A 02/09/2020   Procedure: CORONARY STENT INTERVENTION;  Surgeon: Wenona Hamilton, MD;  Location: [facility] INVAS[facility] LAB;  Service: Cardiovascular;  Laterality: N/A;   CORONARY STENT INTERVENTION N/A 05/22/2020  Procedure: CORONARY STENT INTERVENTION;  Surgeon: Lucendia Rusk, MD;  Location: [facility] INVASIVE CV LAB;  Service: Cardiovascular;  Laterality: N/A;   CORONARY STENT INTERVENTION N/A 07/15/2023   Procedure: CORONARY STENT INTERVENTION;  Surgeon: Swaziland, Peter M, MD;  Location: [facility] INVASIVE CV LAB;  Service: Cardiovascular;  Laterality: N/A;   CORONARY ULTRASOUND/IVUS N/A 02/09/2020   Procedure: Intravascular Ultrasound/IVUS;  Surgeon: Wenona Hamilton, MD;  Location: [facility] INVAS[facility] LAB;  Service: Cardiovascular;  Laterality: N/A;   ENDOVEIN HARVEST OF GREATER SAPHENOUS VEIN Right 08/21/2020   Procedure: ENDOVEIN HARVEST OF GREATER SAPHENOUS VEIN;  Surgeon: Zelphia Higashi, MD;  Location: [facility] OR;  Service: Open Heart Surgery;  Laterality: Right;   HYDROCELE EXCISION Bilateral 06/02/2017   Procedure: HYDROCELECTOMY ADULT;  Surgeon: Adelbert Homans, MD;  Location: [facility];  Service: Urology;  Laterality: Bilateral;  ONLY NEEDS 45 MIN   INGUINAL HERNIA REPAIR     RIGHT GROIN   KNEE ARTHROSCOPY Left 05/23/2019   Procedure: LEFT KNEE ARTHROSCOPY AND DEBRIDEMENT PARTIAL MEDIAL MENISECTOMY;  Surgeon: Timothy Ford, MD;  Location: [facility];  Service:  Orthopedics;  Laterality: Left;   LEFT HEART CATH AND CORONARY ANGIOGRAPHY N/A 02/07/2020   Procedure: LEFT HEART CATH AND CORONARY ANGIOGRAPHY;  Surgeon: Arleen Lacer, MD;  Location: [facility] INVASIVE CV LAB;  Service: Cardiovascular;  Laterality: N/A;   LEFT HEART CATH AND CORONARY ANGIOGRAPHY N/A 05/22/2020   Procedure: LEFT HEART CATH AND CORONARY ANGIOGRAPHY;  Surgeon: Lucendia Rusk, MD;  Location: [facility] INVASIVE CV LAB;  Service: Cardiovascular;  Laterality: N/A;   LEFT HEART CATH AND CORONARY ANGIOGRAPHY N/A 08/15/2020   Procedure: LEFT HEART CATH AND CORONARY ANGIOGRAPHY;  Surgeon: Avanell Leigh, MD;  Location: [facility] INVASIVE CV LAB;  Service: Cardiovascular;  Laterality: N/A;   RIGHT/LEFT HEART CATH AND CORONARY/GRAFT ANGIOGRAPHY N/A 07/14/2023   Procedure: RIGHT/LEFT HEART CATH AND CORONARY/GRAFT ANGIOGRAPHY;  Surgeon: Arleen Lacer, MD;  Location: [facility] INVASIVE CV LAB;  Service: Cardiovascular;  Laterality: N/A;   TEE WITHOUT CARDIOVERSION N/A 08/21/2020   Procedure: TRANSESOPHAGEAL ECHOCARDIOGRAM (TEE);  Surgeon: Zelphia Higashi, MD;  Location: [facility] OR;  Service: Open Heart Surgery;  Laterality: N/A;   TRIGGER FINGER RELEASE Right 01/08/2015   Procedure: RELEASE TRIGGER FINGER/A-1 PULLEY RIGHT MIDDLE FINGER;  Surgeon: Lyanne Sample, MD;  Location: [facility];  Service: Orthopedics;  Laterality: Right;   Family History  Problem Relation Age of Onset   Hyperlipidemia Mother    Hypertension Mother    Hyperlipidemia Father    Hypertension Father    Diabetes Father    Dementia Father    Diabetes Maternal Grandfather    Social History   Socioeconomic History   Marital status: Married    Spouse name: Ann   Number of children: 1   Years of education: 18   Highest education level: Manufacturing engineer (e.g., MA, MS, MEng, MEd, MSW, MBA)  Occupational History   Occupation: Retired    Associate Professor: [employer]  Tobacco Use   Smoking status: Never   Smokeless tobacco:  Never  Vaping Use   Vaping status: Never Used  Substance and Sexual Activity   Alcohol use: No   Drug use: No   Sexual activity: Yes  Other Topics Concern   Not on file  Social History Narrative   Retired Buyer, retail; Lives with his wife.   1 son and family, lives close by.   Social Drivers of Corporate investment banker Strain:  Low Risk  (08/03/2023)   Overall Financial Resource Strain (CARDIA)    Difficulty of Paying Living Expenses: Not hard at all  Food Insecurity: No Food Insecurity (08/03/2023)   Hunger Vital Sign    Worried About Running Out of Food in the Last Year: Never true    Ran Out of Food in the Last Year: Never true  Transportation Needs: No Transportation Needs (02/22/2023)   PRAPARE - Administrator, Civil Service (Medical): No    Lack of Transportation (Non-Medical): No  Physical Activity: Sufficiently Active (08/03/2023)   Exercise Vital Sign    Days of Exercise per Week: 7 days    Minutes of Exercise per Session: 30 min  Stress: No Stress Concern Present (08/03/2023)   [employer] of Occupational Health - Occupational Stress Questionnaire    Feeling of Stress : Not at all  Social Connections: Moderately Integrated (08/03/2023)   Social Connection and Isolation Panel [NHANES]    Frequency of Communication with Friends and Family: Three times a week    Frequency of Social Gatherings with Friends and Family: Three times a week    Attends Religious Services: Never    Active Member of Clubs or Organizations: Yes    Attends Engineer, structural: More than 4 times per year    Marital Status: Married    Tobacco Counseling Counseling given: Not Answered    Clinical Intake:  Pre-visit preparation completed: Yes  Pain : No/denies pain     BMI - recorded: 39.38 Nutritional Status: BMI > 30  Obese Nutritional Risks: None Diabetes: Yes CBG done?: No (104)  Lab Results  Component Value Date   HGBA1C 7.3 (H) 07/14/2023    HGBA1C 6.9 (H) 02/22/2023   HGBA1C 7.6 (H) 11/20/2022     How often do you need to have someone help you when you read instructions, pamphlets, or other written materials from your doctor or pharmacy?: 1 - Never  Interpreter Needed?: No  Information entered by :: Anola Basques T/CMA   Activities of Daily Living     08/03/2023    1:38 PM  In your present state of health, do you have any difficulty performing the following activities:  Hearing? 0  Vision? 0  Difficulty concentrating or making decisions? 0  Walking or climbing stairs? 0  Dressing or bathing? 0  Doing errands, shopping? 0  Preparing Food and eating ? N  Using the Toilet? N  In the past six months, have you accidently leaked urine? N  Do you have problems with loss of bowel control? N  Managing your Medications? N  Managing your Finances? N  Housekeeping or managing your Housekeeping? N    Patient Care Team: Delfina Feller, FNP as PCP - General (Family Medicine) Gerard Knight, MD as PCP - Cardiology (Cardiology) Delilah Fend, [facility] (Pharmacist)  Indicate any recent Medical Services you may have received from other than [facility] providers in the past year (date may be approximate).     Assessment:   This is a routine wellness examination for Loyal.  Hearing/Vision screen Hearing Screening - Comments:: Pt denies hearing dif Vision Screening - Comments:: pt wea reading glasses--pt goes to Dr. Candi Chafe in [place] &amp; Dr. Geanie Keen in [place]   Goals Addressed             This Visit's Progress    Exercise 3x per week (30 min per time)   On track    07/20/2019 AWV Goal: Exercise for General Health  Patient will verbalize understanding of the benefits of increased physical activity: Exercising regularly is important. It will improve your overall fitness, flexibility, and endurance. Regular exercise also will improve your overall health. It can help you control your weight, reduce stress, and improve  your bone density. Over the next year, patient will increase physical activity as tolerated with a goal of at least 150 minutes of moderate physical activity per week.  You can tell that you are exercising at a moderate intensity if your heart starts beating faster and you start breathing faster but can still hold a conversation. Moderate-intensity exercise ideas include: Walking 1 mile (1.6 km) in about 15 minutes Biking Hiking Golfing Dancing Water  aerobics Patient will verbalize understanding of everyday activities that increase physical activity by providing examples like the following: Yard work, such as: Insurance underwriter Gardening Washing windows or floors Patient will be able to explain general safety guidelines for exercising:  Before you start a new exercise program, talk with your health care provider. Do not exercise so much that you hurt yourself, feel dizzy, or get very short of breath. Wear comfortable clothes and wear shoes with good support. Drink plenty of water  while you exercise to prevent dehydration or heat stroke. Work out until your breathing and your heartbeat get faster.        Depression Screen     08/03/2023    1:43 PM 02/22/2023   11:36 AM 11/20/2022    2:29 PM 08/17/2022    2:51 PM 07/29/2022    8:23 AM 05/18/2022    1:50 PM 02/13/2022    2:52 PM  PHQ 2/9 Scores  PHQ - 2 Score 0 0 0 0 0 0 1  PHQ- 9 Score 0 4 0 2  0 3    Fall Risk     08/03/2023    1:37 PM 02/22/2023   11:36 AM 11/20/2022    2:29 PM 08/17/2022    2:51 PM 07/29/2022    8:21 AM  Fall Risk   Falls in the past year? 0 0 0 0 0  Number falls in past yr: 0    0  Injury with Fall? 0    0  Risk for fall due to : No Fall Risks    No Fall Risks  Follow up Falls prevention discussed;Falls evaluation completed    Falls prevention discussed    MEDICARE RISK AT HOME:  Medicare Risk at Home Any stairs in or  around the home?: No If so, are there any without handrails?: No Home free of loose throw rugs in walkways, pet beds, electrical cords, etc?: Yes Adequate lighting in your home to reduce risk of falls?: Yes Life alert?: No Use of a cane, walker or w/c?: No Grab bars in the bathroom?: Yes Shower chair or bench in shower?: Yes Elevated toilet seat or a handicapped toilet?: No  TIMED UP AND GO:  Was the test performed?  no  Cognitive Function: 6CIT completed        08/03/2023    1:44 PM 07/29/2022    8:24 AM 07/23/2021    8:26 AM 07/20/2019    8:47 AM  6CIT Screen  What Year? 0 points 0 points 0 points 0 points  What month? 0 points 0 points 0 points 0 points  What time? 0 points 0 points 0 points 0 points  Count back from 20 0 points 0 points 0  points 0 points  Months in reverse 0 points 0 points 0 points 0 points  Repeat phrase 0 points 0 points 0 points 0 points  Total Score 0 points 0 points 0 points 0 points    Immunizations Immunization History  Administered Date(s) Administered   Fluad Quad(high Dose 65+) 12/25/2019, 02/13/2022   Influenza, High Dose Seasonal PF 01/17/2019   Influenza,inj,Quad PF,6+ Mos 04/11/2014, 02/07/2018   Influenza-Unspecified 11/28/2020   PFIZER(Purple Top)SARS-COV-2 Vaccination 05/06/2019, 05/27/2019, 12/25/2019, 08/02/2020   Pneumococcal Conjugate-13 11/06/2020   Tdap 02/13/2022   Zoster Recombinant(Shingrix) 11/06/2020, 02/06/2021    Screening Tests Health Maintenance  Topic Date Due   Hepatitis C Screening  Never done   Pneumonia Vaccine 22+ Years old (2 of 2 - PPSV23) 02/22/2024 (Originally 01/01/2021)   COVID-19 Vaccine (5 - 2024-25 season) 08/18/2024 (Originally 11/29/2022)   INFLUENZA VACCINE  10/29/2023   HEMOGLOBIN A1C  01/13/2024   Diabetic kidney evaluation - Urine ACR  02/22/2024   FOOT EXAM  02/22/2024   OPHTHALMOLOGY EXAM  03/14/2024   Diabetic kidney evaluation - eGFR measurement  07/14/2024   Medicare Annual Wellness (AWV)   08/02/2024   Fecal DNA (Cologuard)  09/02/2025   DTaP/Tdap/Td (2 - Td or Tdap) 02/14/2032   Zoster Vaccines- Shingrix  Completed   HPV VACCINES  Aged Out   Meningococcal B Vaccine  Aged Out    Health Maintenance  Health Maintenance Due  Topic Date Due   Hepatitis C Screening  Never done   Health Maintenance Items Addressed: See Nurse Notes  Additional Screening:  Vision Screening: Recommended annual ophthalmology exams for early detection of glaucoma and other disorders of the eye.  Dental Screening: Recommended annual dental exams for proper oral hygiene  Community Resource Referral / Chronic Care Management: CRR required this visit?  No   CCM required this visit?  No     Plan:     I have personally reviewed and noted the following in the patient's chart:   Medical and social history Use of alcohol, tobacco or illicit drugs  Current medications and supplements including opioid prescriptions. Patient is not currently taking opioid prescriptions. Functional ability and status Nutritional status Physical activity Advanced directives List of other physicians Hospitalizations, surgeries, and ER visits in previous 12 months Vitals Screenings to include cognitive, depression, and falls Referrals and appointments  In addition, I have reviewed and discussed with patient certain preventive protocols, quality metrics, and best practice recommendations. A written personalized care plan for preventive services as well as general preventive health recommendations were provided to patient.     Michaelle Adolphus, CMA   08/03/2023   After Visit Summary: (MyChart) Due to this being a telephonic visit, the after visit summary with patients personalized plan was offered to patient via MyChart   Notes: Nothing significant to report at this time.

## 2023-08-03 NOTE — Patient Instructions (Signed)
 [name] [name] , Thank you for taking time to come for your Medicare Wellness Visit. I appreciate your ongoing commitment to your health goals. Please review the following plan we discussed and let me know if I can assist you in the future.   Referrals/Orders/Follow-Ups/Clinician Recommendations: n/a  This is a list of the screening recommended for you and due dates:  Health Maintenance  Topic Date Due   Hepatitis C Screening  Never done   Pneumonia Vaccine (2 of 2 - PPSV23) 02/22/2024*   COVID-19 Vaccine (5 - 2024-25 season) 08/18/2024*   Flu Shot  10/29/2023   Hemoglobin A1C  01/13/2024   Yearly kidney health urinalysis for diabetes  02/22/2024   Complete foot exam   02/22/2024   Eye exam for diabetics  03/14/2024   Yearly kidney function blood test for diabetes  07/14/2024   Medicare Annual Wellness Visit  08/02/2024   Cologuard (Stool DNA test)  09/02/2025   DTaP/Tdap/Td vaccine (2 - Td or Tdap) 02/14/2032   Zoster (Shingles) Vaccine  Completed   HPV Vaccine  Aged Out   Meningitis B Vaccine  Aged Out  *Topic was postponed. The date shown is not the original due date.    Advanced directives: (Declined) Advance directive discussed with you today. Even though you declined this today, please call our office should you change your mind, and we can give you the proper paperwork for you to fill out.  Next Medicare Annual Wellness Visit scheduled for next year: Yes  Have you seen your provider in the last 6 months (3 months if uncontrolled diabetes)? Yes4/16/25

## 2023-08-05 ENCOUNTER — Other Ambulatory Visit: Payer: Self-pay | Admitting: Nurse Practitioner

## 2023-08-05 DIAGNOSIS — Z794 Long term (current) use of insulin: Secondary | ICD-10-CM

## 2023-08-05 MED ORDER — NOVOLOG FLEXPEN 100 UNIT/ML ~~LOC~~ SOPN: PEN_INJECTOR | SUBCUTANEOUS | 2 refills | Status: DC

## 2023-08-06 ENCOUNTER — Ambulatory Visit: Attending: Nurse Practitioner | Admitting: Nurse Practitioner

## 2023-08-06 ENCOUNTER — Encounter: Payer: Self-pay | Admitting: Nurse Practitioner

## 2023-08-06 VITALS — BP 128/80 | HR 79 | Ht 68.0 in | Wt 255.0 lb

## 2023-08-06 DIAGNOSIS — R0609 Other forms of dyspnea: Secondary | ICD-10-CM | POA: Diagnosis not present

## 2023-08-06 DIAGNOSIS — I2510 Atherosclerotic heart disease of native coronary artery without angina pectoris: Secondary | ICD-10-CM

## 2023-08-06 DIAGNOSIS — E669 Obesity, unspecified: Secondary | ICD-10-CM

## 2023-08-06 DIAGNOSIS — I25118 Atherosclerotic heart disease of native coronary artery with other forms of angina pectoris: Secondary | ICD-10-CM

## 2023-08-06 DIAGNOSIS — E782 Mixed hyperlipidemia: Secondary | ICD-10-CM | POA: Diagnosis not present

## 2023-08-06 DIAGNOSIS — I10 Essential (primary) hypertension: Secondary | ICD-10-CM

## 2023-08-06 DIAGNOSIS — Z79899 Other long term (current) drug therapy: Secondary | ICD-10-CM

## 2023-08-06 MED ORDER — FUROSEMIDE 40 MG PO TABS: 40.0000 mg | ORAL_TABLET | Freq: Every day | ORAL | 5 refills | Status: DC

## 2023-08-06 NOTE — Progress Notes (Unsigned)
 Cardiology Office Note:  .   Date: 08/06/2023 ID:  [name] [name], DOB [birth year], MRN [id number] PCP: Delfina Feller, FNP  [facility] Providers Cardiologist:  Teddie Favre, MD    History of Present Illness: .   [name] [name] is a 68 y.o. male with a PMH of CAD, s/p CABG in 2022, mixed HLD, HTN, T2DM, and obesity, who presents today for scheduled follow-up.  Previous cardiovascular history of drug-eluting stent to the distal circumflex in 2005.  In 2012, he received drug-eluting stent to the LAD/diagonal bifurcation.  In 2021 he received drug-eluting stent to the ostial LAD and drug-eluting stent to the posterolateral in 2021, ultimately underwent CABG in May 2022 with SVG-diagonal, LIMA-LAD, SVG-OM1 and OM 3.  EF at that time was 60-65%.  04/01/2023 - Today he presents for follow-up. Admits to chest pain ever since his surgery that is noticed during cold/ rainy/wet weather. Doesn't notice this in the summer months. States it occurs across his chest, describes pain as a burning sensation or a "hot poker" is in his chest. Denies any shortness of breath, palpitations, syncope, presyncope, dizziness, orthopnea, PND, swelling or significant weight changes, acute bleeding, or claudication.  Seen by Dr. Londa Rival on June 29, 2023.  Patient continued to note exertional angina and DOE despite medication adjustments from previous office visit. Due to symptoms, he said it limited his ability to exercise. Lexiscan  Myoview  was arranged - see report below.   07/05/2023 - Presents today for follow-up.  Says he has not noticed any improvement/worsening of his symptoms since I last saw him. Denies any palpitations, syncope, presyncope, dizziness, orthopnea, PND, swelling or significant weight changes, acute bleeding, or claudication.    08/06/2023 - Presents today for follow-up postcardiac catheterization.  Says he does seem to be breathing better, however not completely resolved.  Does have some  swelling located along the right wrist from cardiac catheterization, says this looks better than it did before. Says he has been noticing DOE since his bypass surgery. Denies any chest pain,  palpitations, syncope, presyncope, dizziness, orthopnea, PND, significant weight changes, acute bleeding, or claudication.  ROS: Negative. See HPI.   Studies Reviewed: Aaron Aas    EKG: EKG is not ordered today.  Coronary stent intervention 06/2023:   Prox RCA lesion is 80% stenosed.   Dist LM to Prox LAD lesion is 100% stenosed.   Dist LAD lesion is 60% stenosed.   Mid LAD-1 lesion is 20% stenosed with 100% stenosed side branch in 2nd Diag.   Prox LAD to Mid LAD lesion is 20% stenosed.   Ost Cx to Prox Cx lesion is 70% stenosed.   Origin to Prox Graft lesion is 100% stenosed.   Origin to Prox Graft lesion before Ramus  is 100% stenosed.   1st Mrg lesion is 85% stenosed.   1st LPL-2 lesion is 80% stenosed.   LPAV-1 lesion is 50% stenosed with 90% stenosed side branch in 1st LPL.   LPAV-2 lesion is 100% stenosed.   Non-stenotic Mid LAD-2 lesion was previously treated.   Non-stenotic 1st LPL-1 lesion was previously treated.   Non-stenotic Ost LM to Mid LM lesion.   A drug-eluting stent was successfully placed using a STENT ONYX FRONTIER 2.5X12.   A drug-eluting stent was successfully placed using a SYNERGY XD 4.0X12.   A stent was successfully placed.   Post intervention, there is a 100% residual stenosis.   Post intervention, there is a 0% residual stenosis.   Post intervention, there is  a 0% residual stenosis.   Post intervention, there is a 0% residual stenosis.   Post intervention, the side branch was reduced to 0% residual stenosis.   LIMA and is normal in caliber.   SVG due to known occlusion.   Seq SVG- SVG-OM1/RI-OM 3 due to known occlusion.   The graft exhibits no disease.   Recommend uninterrupted dual antiplatelet therapy with Aspirin  81mg  daily and Prasugrel  10mg  daily for a minimum of 12  months (ACS-Class I recommendation).   Successful PCI of the third OM with DES Successful PCI of the ostial LCX with DES Unsuccessful PCI of the distal LCx. This is occluded. Unable to cross with a balloon   Plan: anticipate same day DC. DAPT for one year  Limited echo 06/2023: 1. Left ventricular ejection fraction, by estimation, is 55 to 60%. The  left ventricle has normal function.   2. Right ventricular systolic function is normal.   3. No clear evidence of pericardial effusion though technically difficult  study.   Right/left heart cath 06/2023:    Prox LAD lesion is 100% stenosed.  Prox LAD to Mid LAD stent is 20% stenosed. Mid LAD-1 lesion is 20% stenosed with 100% stenosed side branch in 2nd Diag.  There is retrograde filling to the occlusion from the LIMA graft.   LIMA-dLAD graft was visualized by angiography and is normal in caliber. The graft exhibits no disease.   Dist LAD lesion is 60% stenosed beyond graft insertion.   Ost Cx to Prox Cx lesion is 70% stenosed.   1st Mrg lesion is 85% stenosed.   LPAV-1 lesion is 50% stenosed with 90% stenosed side branch in 1st LPL. 1st LPL-1 stent is widely patent. 1st LPL-2 lesion is 80% stenosed.   LPAV-2 lesion is 95% stenosed.   Prox RCA lesion is 80% stenosed.   --------------------------------------------   -----------------Grafts: Patent LIMA to LAD but otherwise occluded vein graft.--------------   SVG-1st Diag graft was not visualized due to known occlusion. Origin to Prox Graft lesion is 100% stenosed.   Seq SVG- SVG-OM1/RI-OM 3 graft was not visualized due to occlusion. Origin to Prox Graft lesion before Ramus is 100% stenosed.   --------------------------------------------   Hemodynamic findings consistent with borderline pulmonary hypertension.   There is no aortic valve stenosis.       Multivessel Disease: -100% flush occlusion ostial LAD => fills via LIMA to distal LAD with retrograde filling back up to most of the  vessel, but occluded SVG-diagonal - Ostial LCx focal 70% stenosis followed by major Lateral Marginal (PL) with ostial 70% and then known subtotal occlusion of the distal AVG LCx to the PDA - Small nondominant RCA with mid 80% stenosis-stable   Grafts: Patent LIMA to LAD but flush occlusion of SVG-D1 and SVG-OM1-OM3. RHC: RA mean 11 mmHg, RV-EDP 37/1-10 mmHg; PAP-mean 32/13-21 mmHg; PCP mean 18 mmHg. Ao sat 93%, PA sat 63%. Fick CO-CI 5.71-2.55  Arterial hemodynamics: LVEDP-EDP 142/9-18 mmHg; ALP-MAP 136/67-99 mmHg.     RECOMMENDATION   In the absence of any other complications or medical issues, we expect the patient to be ready for discharge from a cath perspective on 07/14/2023.   Plan will be for staged PCI to the circumflex and multiple locations on Monday morning 07/19/2023: Will be scheduled for first case.   Recommend uninterrupted dual antiplatelet therapy with Prasugrel  10mg  daily for a minimum of 6 months (stable ischemic heart disease-Class I recommendation).   Load with 60 mg Effient  and will be discharged on  10 mg daily. With plan for staged PCI to the LCx in multiple locations, with plan to continue thienopyridine coverage beyond 6 months, at 6 months could convert to complete available monotherapy for long-term management.      Lexiscan  Myoview  06/2023:   Findings are consistent with large area of  ischemia in both the lateral and inferior/inferoapical walls. The study is high risk.   No ST deviation was noted.   LV perfusion is abnormal. Large moderate intensity lateral defect nearly completely reversible. Large moderate intensity inferior/infero apical defect nearly completely reversible. SSS 18 SRS 4 SDS 14.   Left ventricular function is abnormal. Nuclear stress EF: 43%. The left ventricular ejection fraction is moderately decreased (30-44%). End diastolic cavity size is normal.  Echo intraoperative TEE 07/2020:  POST-OP IMPRESSIONS  - Left Ventricle: The left ventricle is  unchanged from pre-bypass.  - Right Ventricle: The right ventricle appears unchanged from pre-bypass.  - Aorta: The aorta appears unchanged from pre-bypass.  - Left Atrium: The left atrium appears unchanged from pre-bypass.  - Left Atrial Appendage: The left atrial appendage appears unchanged from  pre-bypass.  - Aortic Valve: The aortic valve appears unchanged from pre-bypass.  - Mitral Valve: The mitral valve appears unchanged from pre-bypass.  - Tricuspid Valve: The tricuspid valve appears unchanged from pre-bypass.  - Pulmonic Valve: The pulmonic valve appears unchanged from pre-bypass.  - Interatrial Septum: The interatrial septum appears unchanged from  pre-bypass.  - Interventricular Septum: The interventricular septum appears unchanged  from  pre-bypass.  - Pericardium: The pericardium appears unchanged from pre-bypass.  Vascular doppler pre-CABG 07/2020:  Summary:  Right Carotid: Velocities in the right ICA are consistent with a 1-39%  stenosis.   Left Carotid: Velocities in the left ICA are consistent with a 1-39%  stenosis.  Vertebrals: Bilateral vertebral arteries demonstrate antegrade flow.   Right Upper Extremity: Doppler waveforms remain within normal limits with  right radial compression. Doppler waveforms remain within normal limits  with right ulnar compression.  Left Upper Extremity: Doppler waveforms remain within normal limits with left radial compression. Doppler waveforms decrease 50% with left ulnar compression.    Echo 07/2020:  1. Left ventricular ejection fraction, by estimation, is 60 to 65%. The  left ventricle has normal function. Left ventricular endocardial border  not optimally defined to evaluate regional wall motion. Grossly, no wall  motion abnormalities seen. There is  mild concentric left ventricular hypertrophy. Left ventricular diastolic  parameters are consistent with Grade I diastolic dysfunction (impaired  relaxation). Elevated left  atrial pressure.   2. Right ventricular systolic function is normal. The right ventricular  size is normal. Tricuspid regurgitation signal is inadequate for assessing  PA pressure.   3. The mitral valve is normal in structure. No evidence of mitral valve  regurgitation.   4. The aortic valve is tricuspid. There is mild calcification of the  aortic valve. Aortic valve regurgitation is not visualized. Mild aortic  valve sclerosis is present, with no evidence of aortic valve stenosis.   Comparison(s): No significant change from prior study. Prior echo in  01/2020 demonstrated mild ascending aorta dilation (40mm) which was not  well visualized on current study. Will need continued monitoring as  outpatient.  LHC 07/2020:  Prox RCA lesion is 70% stenosed. LPAV lesion is 100% stenosed. Ost LAD to Prox LAD lesion is 95% stenosed. Mid LAD lesion is 80% stenosed. 2nd Mrg lesion is 90% stenosed. Previously placed 1st LPL stent (unknown type) is widely patent.  LHC 04/2020:  Prox RCA lesion is 70% stenosed. Dist LAD lesion is 60% stenosed. 1st Mrg lesion is 85% stenosed. 1st LPL-2 lesion is 80% stenosed. Previously placed 1st LPL-1 drug eluting stent is widely patent. LPAV-2 lesion is 100% stenosed. Dist LM to Prox LAD lesion is 90% stenosed, instent restenosis. Treated with 3.25 mm [place] and then 4.0 [place] balloon. Dissection noted by OCT past the edge of old stent. A drug-eluting stent was successfully placed using a SYNERGY XD 3.50X12., post dilated to 4.0 mm. Post intervention, there is a 0% residual stenosis. Stent optimized with OCT. Mid LAD-1 lesion is 20% stenosed. Mid LAD-2 lesion is 75% stenosed. In-stent restenosis. Scoring balloon angioplasty was performed using a BALLOON WOL[place]INE 3.25X10. Post intervention, there is a 0% residual stenosis. The left ventricular systolic function is normal. LV end diastolic pressure is normal. The left ventricular ejection fraction is 55-65% by  visual estimate. There is no aortic valve stenosis. A drug-eluting stent was successfully placed using a SYNERGY XD 3.50X12.   Continue dual antiplatelet therapy along with aggressive secondary prevention.    Diffuse, distal and small vessel disease in the LAD and OM territories.  Aggressive medical therapy.   Coronary atherectomy, LHC 01/2020: Prox RCA lesion is 70% stenosed. Dist LM to Prox LAD lesion is 95% stenosed. Dist LAD lesion is 60% stenosed. Mid LAD lesion is 20% stenosed. Prox LAD to Mid LAD lesion is 20% stenosed. 1st Mrg lesion is 85% stenosed. 1st LPL-1 lesion is 95% stenosed. 1st LPL-2 lesion is 80% stenosed. LPAV-1 lesion is 30% stenosed. LPAV-2 lesion is 100% stenosed. Post intervention, there is a 0% residual stenosis. A drug-eluting stent was successfully placed using a SYNERGY XD 3.50X12. Post intervention, there is a 0% residual stenosis. A drug-eluting stent was successfully placed using a SYNERGY XD 2.25X16.   1. Successful IVUS guided orbital atherectomy and drug eluting stent placement to the ostial LAD. 2.  Successful angioplasty and drug-eluting stent placement to first PL branch of the left circumflex.     Recommendations: Continue indefinite dual antiplatelet therapy. Aggressive treatment of risk factors.   LHC 01/2020: Ostial LAD lesion is 95% stenosed. (Image does not allow drawing ostial LAD lesions) Prox LAD to Mid LAD lesion is 20% stenosed. Mid LAD STENT is 20% stenosed Dist LAD lesion is 60% stenosed. Very small caliber 1st Mrg lesion is 85% stenosed. No change from 2012 LPAV-1 lesion is 30% stenosed just prior to giving off LPL 1. LPAV-2 STENT is 100% stenosed -which leaves the L PDA occluded 1st LPL-1 lesion is 95% stenosed. 1st LPL-2 lesion is 80% stenosed. Prox RCA lesion is 70% stenosed. ---------------------------- The left ventricular ejection fraction is 35-45% by visual estimate. There is moderate left ventricular systolic  dysfunction. LV end diastolic pressure is normal.   SUMMARY Severe multivessel CAD:  Heavily calcified eccentric 95% ostial LAD with mild in-stent restenosis of proximal-mid LAD stent and downstream diffuse mild to moderate disease with a focal 60% lesion;  small caliber ramus intermedius mild diffuse disease, heavily diseased OM1 (no change from 2012).   Very patulous dilated dominant LCx: 100% CTO of previously placed Stent leading from the AV groove circumflex to the PDA (no collateral)  95% mid LPL stenosis.  Nondominant RCA with proximal 70%. At least moderately reduced LVEF estimated roughly 45% with apical anterior hypokinesis. Severely elevated LVEDP - 28 mm.     RECOMMENDATIONS Very difficult situation with significant disease.  Need to review with heart team approach discussed between  CVTS surgeons and other IC physicians.  Brief discussion with Dr. Nicanor Barge would indicate that only the distal LAD would be a target for LIMA.  Dr. Alvenia Aus feels that the ostial LAD could be treated with atherectomy and stenting as could the LPL lesion.  Perhaps this would be a better option, however we need to assess EF. Anticipate full CVTS consultation note tomorrow.  We will hold off on stopping Effient  until the decision is made about CABG versus PCI If CABG: Would not be until Wednesday 11/17 If PCI: Would wait until Friday 11/12 with Dr. Alvenia Aus -> depending on EF, may or may not need to consider Impella support With unstable angina presentation-chest pain and dyspnea with minimal exertion, I do not feel comfortable discharging this gentleman till a full plan is in place.  We will admit him to telemetry on the Interventional Team Service to allow time for HEART TEAM DISCUSSION for best option going forward. Check 2D echocardiogram The patient has a relatively difficult diabetes regimen, will start with a Lantus  (at the lower end of his range) with mealtime and sliding scale coverage.  Diabetes  education team to assist since he has a [employer]' monitor. Continue to titrate antianginals per IC Team (Team C)   Physical Exam:   VS:  BP 128/80   Pulse 79   Ht 5\' 8"  (1.727 m)   Wt 255 lb (115.7 kg)   SpO2 96%   BMI 38.77 kg/m    Wt Readings from Last 3 Encounters:  08/06/23 255 lb (115.7 kg)  08/03/23 259 lb (117.5 kg)  07/14/23 250 lb (113.4 kg)    GEN: Obese, 68 y.o. male in no acute distress NECK: No JVD; No carotid bruits CARDIAC: S1/S2, RRR, no murmurs, rubs, gallops RESPIRATORY:  Clear to auscultation without rales, wheezing or rhonchi  ABDOMEN: Soft, non-tender, non-distended EXTREMITIES: Minimal, trace edema to right radial site, no pain per his report and improving per his report, otherwise no edema; No deformity   ASSESSMENT AND PLAN: .    CAD, s/p CABG in 2022, DOE, medication management Admits to improved DOE. See recent left heart catheterization results as noted above.  Underwent successful PCI of third OM with DES and PCI of ostial left circumflex with DES, did have occlusion of distal left circumflex and was unable to do PCI of this location.  Recommended DAPT for 1 year.  No explanation of DOE noted during right heart catheterization.  Most recent limited echo revealed EF 55 to 60%.  Continue aspirin , atorvastatin , carvedilol , Imdur , lisinopril , Effient , and nitroglycerin  as needed.  Heart healthy diet and regular cardiovascular exercise encouraged.  He has what appears to be resolving, trace hematoma to right radial site.  Conservative measures discussed.  Care and ED precautions discussed.  Will increase Lasix  to 40 mg daily to help improve DOE.  Will obtain CBC, BMET, and Magnesium  level in 1 week.    Mixed HLD LDL 40 in November 2024.  Triglycerides 219.  Patient is requesting to work on lifestyle changes first.  Mediterranean diet and regular cardiovascular exercise encouraged.  Continue current medication regimen. Continue to follow with  PCP.  HTN Blood pressure stable. Discussed to monitor BP at home at least 2 hours after medications and sitting for 5-10 minutes.  Continue current medication regimen.  Heart healthy diet and regular cardiovascular exercise encouraged.  Obesity Weight loss via diet and exercise encouraged. Discussed the impact being overweight would have on cardiovascular risk.   Dispo: Follow-up with  me/APP in 6 weeks or sooner if anything changes.  Signed, Lasalle Pointer, NP

## 2023-08-06 NOTE — Patient Instructions (Addendum)
 Medication Instructions:  Your physician has recommended you make the following change in your medication:  Increase your Lasix  20 mg to 40 mg once daily  Continue taking all other medications as prescribed   Labwork: BMET, CBC, and Magnesium  to be completed in 1 week  Testing/Procedures: None  Follow-Up: Your physician recommends that you schedule a follow-up appointment in: 6 weeks  Any Other Special Instructions Will Be Listed Below (If Applicable). Thank you for choosing [facility]!     If you need a refill on your cardiac medications before your next appointment, please call your pharmacy.

## 2023-08-13 DIAGNOSIS — R0609 Other forms of dyspnea: Secondary | ICD-10-CM | POA: Diagnosis not present

## 2023-08-13 DIAGNOSIS — Z79899 Other long term (current) drug therapy: Secondary | ICD-10-CM | POA: Diagnosis not present

## 2023-08-13 DIAGNOSIS — I25118 Atherosclerotic heart disease of native coronary artery with other forms of angina pectoris: Secondary | ICD-10-CM | POA: Diagnosis not present

## 2023-08-14 LAB — BASIC METABOLIC PANEL WITH GFR
BUN/Creatinine Ratio: 27 — ABNORMAL HIGH (ref 10–24)
BUN: 26 mg/dL (ref 8–27)
CO2: 24 mmol/L (ref 20–29)
Calcium: 9.7 mg/dL (ref 8.6–10.2)
Chloride: 102 mmol/L (ref 96–106)
Creatinine, Ser: 0.95 mg/dL (ref 0.76–1.27)
Glucose: 105 mg/dL — ABNORMAL HIGH (ref 70–99)
Potassium: 4.3 mmol/L (ref 3.5–5.2)
Sodium: 144 mmol/L (ref 134–144)
eGFR: 86 mL/min/{1.73_m2} (ref >59)

## 2023-08-14 LAB — CBC
Hematocrit: 41.2 % (ref 37.5–51.0)
Hemoglobin: 13.1 g/dL (ref 13.0–17.7)
MCH: 27.6 pg (ref 26.6–33.0)
MCHC: 31.8 g/dL (ref 31.5–35.7)
MCV: 87 fL (ref 79–97)
Platelets: 253 10*3/uL (ref 150–450)
RBC: 4.75 x10E6/uL (ref 4.14–5.80)
RDW: 14.5 % (ref 11.6–15.4)
WBC: 6.4 10*3/uL (ref 3.4–10.8)

## 2023-08-14 LAB — MAGNESIUM: Magnesium: 1.7 mg/dL (ref 1.6–2.3)

## 2023-08-17 ENCOUNTER — Encounter: Payer: Self-pay | Admitting: Nurse Practitioner

## 2023-08-17 ENCOUNTER — Ambulatory Visit: Payer: Medicare PPO | Admitting: Nurse Practitioner

## 2023-08-17 VITALS — BP 125/67 | HR 88 | Temp 97.5°F | Ht 68.0 in | Wt 248.0 lb

## 2023-08-17 DIAGNOSIS — E1159 Type 2 diabetes mellitus with other circulatory complications: Secondary | ICD-10-CM | POA: Diagnosis not present

## 2023-08-17 DIAGNOSIS — I25110 Atherosclerotic heart disease of native coronary artery with unstable angina pectoris: Secondary | ICD-10-CM | POA: Diagnosis not present

## 2023-08-17 DIAGNOSIS — Z6836 Body mass index (BMI) 36.0-36.9, adult: Secondary | ICD-10-CM | POA: Diagnosis not present

## 2023-08-17 DIAGNOSIS — K219 Gastro-esophageal reflux disease without esophagitis: Secondary | ICD-10-CM

## 2023-08-17 DIAGNOSIS — R3911 Hesitancy of micturition: Secondary | ICD-10-CM

## 2023-08-17 DIAGNOSIS — N401 Enlarged prostate with lower urinary tract symptoms: Secondary | ICD-10-CM

## 2023-08-17 DIAGNOSIS — E1169 Type 2 diabetes mellitus with other specified complication: Secondary | ICD-10-CM | POA: Diagnosis not present

## 2023-08-17 DIAGNOSIS — E118 Type 2 diabetes mellitus with unspecified complications: Secondary | ICD-10-CM | POA: Diagnosis not present

## 2023-08-17 DIAGNOSIS — I10 Essential (primary) hypertension: Secondary | ICD-10-CM | POA: Diagnosis not present

## 2023-08-17 DIAGNOSIS — E785 Hyperlipidemia, unspecified: Secondary | ICD-10-CM

## 2023-08-17 DIAGNOSIS — Z794 Long term (current) use of insulin: Secondary | ICD-10-CM

## 2023-08-17 DIAGNOSIS — I200 Unstable angina: Secondary | ICD-10-CM

## 2023-08-17 MED ORDER — ATORVASTATIN CALCIUM 80 MG PO TABS: 80.0000 mg | ORAL_TABLET | Freq: Every day | ORAL | 1 refills | Status: DC

## 2023-08-17 MED ORDER — FUROSEMIDE 40 MG PO TABS
40.0000 mg | ORAL_TABLET | Freq: Every day | ORAL | 1 refills | Status: DC
Start: 2023-08-17 — End: 2024-01-25

## 2023-08-17 MED ORDER — ISOSORBIDE MONONITRATE ER 30 MG PO TB24
30.0000 mg | ORAL_TABLET | Freq: Two times a day (BID) | ORAL | 1 refills | Status: DC
Start: 2023-08-17 — End: 2024-01-25

## 2023-08-17 MED ORDER — NOVOLOG FLEXPEN 100 UNIT/ML ~~LOC~~ SOPN: PEN_INJECTOR | SUBCUTANEOUS | 2 refills | Status: DC

## 2023-08-17 MED ORDER — LISINOPRIL 5 MG PO TABS: 5.0000 mg | ORAL_TABLET | Freq: Every day | ORAL | 1 refills | Status: DC

## 2023-08-17 MED ORDER — PANTOPRAZOLE SODIUM 40 MG PO TBEC: 40.0000 mg | DELAYED_RELEASE_TABLET | Freq: Every day | ORAL | 1 refills | Status: DC | PRN

## 2023-08-17 MED ORDER — TAMSULOSIN HCL 0.4 MG PO CAPS: 0.4000 mg | ORAL_CAPSULE | Freq: Every day | ORAL | 1 refills | Status: DC

## 2023-08-17 MED ORDER — CARVEDILOL 6.25 MG PO TABS: 6.2500 mg | ORAL_TABLET | Freq: Two times a day (BID) | ORAL | 1 refills | Status: DC

## 2023-08-17 MED ORDER — METFORMIN HCL 500 MG PO TABS: 1000.0000 mg | ORAL_TABLET | Freq: Two times a day (BID) | ORAL | 1 refills | Status: DC

## 2023-08-17 MED ORDER — OZEMPIC (2 MG/DOSE) 8 MG/3ML ~~LOC~~ SOPN: 2.0000 mg | PEN_INJECTOR | SUBCUTANEOUS | 5 refills | Status: DC

## 2023-08-17 MED ORDER — INSULIN GLARGINE 100 UNIT/ML ~~LOC~~ SOLN: 48.0000 [IU] | SUBCUTANEOUS | 5 refills | Status: DC

## 2023-08-17 NOTE — Progress Notes (Signed)
 Subjective:    Patient ID: [name] [name], male    DOB: 07-12-1955, 68 y.o.   MRN: [id number]   Chief Complaint: medical management of chronic issues     HPI:  [name] [name] is a 68 y.o. who identifies as a male who was assigned male at birth.   Social history: Lives with: wife Work history: retired Buyer, retail   Comes in today for follow up of the following chronic medical issues:  1. Essential hypertension, benign No c/o chest pain, sob or headache. Doe snot check blood pressure at home. BP Readings from Last 3 Encounters:  08/17/23 125/67  08/06/23 128/80  08/03/23 122/70     2. Accelerating angina (HCC) 3. Coronary artery disease involving native coronary artery of native heart with unstable angina pectoris [facility]) Last saw cardiology on 09/28/22. No changes were made to plan of care  He was to follow up in 6 months  4. Hyperlipidemia associated with type 2 diabetes mellitus (HCC) Does not watch diet very closely and does very little exercise. Lab Results  Component Value Date   CHOL 82 07/15/2023   HDL 22 (L) 07/15/2023   LDLCALC 39 07/15/2023   TRIG 106 07/15/2023   CHOLHDL 3.7 07/15/2023     5. Type 2 diabetes mellitus with complication, with long-term current use of insulin  (HCC) Fating blood sugars are running around 90-110. Some  low blood sugars at night. Has dexcom and checks it frequently Lab Results  Component Value Date   HGBA1C 7.3 (H) 07/14/2023     6. Gastroesophageal reflux disease without esophagitis S on daily dose of protonix  and is dong well.  7. Benign prostatic hyperplasia with urinary hesitancy No voiding issues  8. BMI 36.0-36.9,adult Weight is down 7lbs Wt Readings from Last 3 Encounters:  08/17/23 248 lb (112.5 kg)  08/06/23 255 lb (115.7 kg)  08/03/23 259 lb (117.5 kg)   BMI Readings from Last 3 Encounters:  08/17/23 37.71 kg/m  08/06/23 38.77 kg/m  08/03/23 39.38 kg/m      New complaints: Patient has  had 2 cardiac caths since last visit. He had to blockage in bypass areas and 2 new blocks. He had old bypass areas cleaned out and 2 new stents put in new areas. Is doing well. He says he feels the same  No Known Allergies Outpatient Encounter Medications as of 08/17/2023  Medication Sig   Accu-Chek Softclix Lancets lancets CHECK BLOOD SUGAR UP TO 4 TIMES A DAY.Dx E11.8   aspirin  EC 81 MG tablet Take 1 tablet (81 mg total) by mouth daily. Swallow whole.   atorvastatin  (LIPITOR ) 80 MG tablet Take 1 tablet (80 mg total) by mouth daily.   B-D ULTRAFINE III SHORT PEN 31G X 8 MM MISC USE 1 PEN NEEDLE UP TO 8 TIMES DAILY AS NEEDED WITH INSULIN  Dx E11.8   blood glucose meter kit and supplies Dispense based on patient and insurance preference. Use up to four times daily as directed. (FOR ICD-10 E10.9, E11.9).   carvedilol  (COREG ) 6.25 MG tablet Take 1 tablet (6.25 mg total) by mouth 2 (two) times daily. (Patient taking differently: Take 6.25 mg by mouth 2 (two) times daily. Take 1/2 tab daily)   CINNAMON  PO Take 1,000 mg by mouth 2 (two) times daily.   Continuous Glucose Sensor (FREESTYLE LIBRE 3 PLUS SENSOR) MISC CHANGE SENSOR EVERY 15 DAYS   CRANBERRY PO Take 4,200 mg by mouth 2 (two) times daily.   furosemide  (LASIX ) 40 MG tablet  Take 1 tablet (40 mg total) by mouth daily.   glucose blood (ACCU-CHEK AVIVA PLUS) test strip CHECK BLOOD SUGAR UP TO 4 TIMES A DAY.Dx E11.8   insulin  glargine (LANTUS ) 100 UNIT/ML injection Inject 48-78 Units into the skin See admin instructions. Inject 48 units SQ every morning & then take 78 units SQ at night per patient   Insulin  Pen Needle 32G X 4 MM MISC 1 each by Does not apply route daily.   isosorbide  mononitrate (IMDUR ) 30 MG 24 hr tablet Take 1 tablet (30 mg total) by mouth in the morning and at bedtime.   lisinopril  (ZESTRIL ) 5 MG tablet Take 1 tablet (5 mg total) by mouth daily.   loratadine  (CLARITIN ) 10 MG tablet Take 10 mg by mouth daily.   metFORMIN   (GLUCOPHAGE ) 500 MG tablet Take 2 tablets by mouth twice daily   nitroGLYCERIN  (NITROSTAT ) 0.4 MG SL tablet Place 1 tablet (0.4 mg total) under the tongue every 5 (five) minutes x 3 doses as needed for chest pain (if no relief after 2nd dose, proceed to ED or call 911).   NOVOLOG  FLEXPEN 100 UNIT/ML FlexPen INJECT 48 TO 78 UNITS SUBCUTANEOUSLY TWICE DAILY PER SLIDING SCALE   pantoprazole  (PROTONIX ) 40 MG tablet Take 1 tablet (40 mg total) by mouth daily as needed (for acid reflux).   prasugrel  (EFFIENT ) 10 MG TABS tablet Take 1 tablet (10 mg total) by mouth daily.   Semaglutide , 2 MG/DOSE, (OZEMPIC , 2 MG/DOSE,) 8 MG/3ML SOPN Inject 2 mg into the skin once a week.   tamsulosin  (FLOMAX ) 0.4 MG CAPS capsule Take 1 capsule (0.4 mg total) by mouth at bedtime.   No facility-administered encounter medications on file as of 08/17/2023.    Past Surgical History:  Procedure Laterality Date   CARDIAC CATHETERIZATION  2012   CAROTID STENT     stents x 2    CHOLECYSTECTOMY N/A 12/04/2015   Procedure: LAPAROSCOPIC CHOLECYSTECTOMY;  Surgeon: Alanda Allegra, MD;  Location: [facility];  Service: General;  Laterality: N/A;   CHOLECYSTECTOMY, LAPAROSCOPIC  12/05/2015   CORONARY ANGIOPLASTY  2012   STENT X 1 2012, STENT X 1 YRS BEFORE   CORONARY ARTERY BYPASS GRAFT N/A 08/21/2020   Procedure: CORONARY ARTERY BYPASS GRAFTING (CABG) X 4, USING LEFT INTERNAL MAMMARY ARTERY AND RIGHT GREATER SAPHENOUS VEIN HARVESTED ENDOSCOPICALLY. SVG TO OM1, OM3 SEQUENTIALLY, SVG TO DIAG., LIMA TO LAD;  Surgeon: Zelphia Higashi, MD;  Location: [facility] OR;  Service: Open Heart Surgery;  Laterality: N/A;   CORONARY ATHERECTOMY N/A 02/09/2020   Procedure: CORONARY ATHERECTOMY;  Surgeon: Wenona Hamilton, MD;  Location: [facility] INVASIVE CV LAB;  Service: Cardiovascular;  Laterality: N/A;   CORONARY IMAGING/OCT N/A 05/22/2020   Procedure: INTRAVASCULAR IMAGING/OCT;  Surgeon: Lucendia Rusk, MD;  Location: [facility] INVASIVE CV LAB;  Service:  Cardiovascular;  Laterality: N/A;   CORONARY LITHOTRIPSY N/A 07/15/2023   Procedure: CORONARY LITHOTRIPSY;  Surgeon: Swaziland, Peter M, MD;  Location: [facility] INVASIVE CV LAB;  Service: Cardiovascular;  Laterality: N/A;   CORONARY STENT INTERVENTION N/A 02/09/2020   Procedure: CORONARY STENT INTERVENTION;  Surgeon: Wenona Hamilton, MD;  Location: [facility] INVASIVE CV LAB;  Service: Cardiovascular;  Laterality: N/A;   CORONARY STENT INTERVENTION N/A 05/22/2020   Procedure: CORONARY STENT INTERVENTION;  Surgeon: Lucendia Rusk, MD;  Location: [facility] INVASIVE CV LAB;  Service: Cardiovascular;  Laterality: N/A;   CORONARY STENT INTERVENTION N/A 07/15/2023   Procedure: CORONARY STENT INTERVENTION;  Surgeon: Swaziland, Peter M, MD;  Location: [facility]  INVASIVE CV LAB;  Service: Cardiovascular;  Laterality: N/A;   CORONARY ULTRASOUND/IVUS N/A 02/09/2020   Procedure: Intravascular Ultrasound/IVUS;  Surgeon: Wenona Hamilton, MD;  Location: [facility] INVASIVE CV LAB;  Service: Cardiovascular;  Laterality: N/A;   ENDOVEIN HARVEST OF GREATER SAPHENOUS VEIN Right 08/21/2020   Procedure: ENDOVEIN HARVEST OF GREATER SAPHENOUS VEIN;  Surgeon: Zelphia Higashi, MD;  Location: [facility] OR;  Service: Open Heart Surgery;  Laterality: Right;   HYDROCELE EXCISION Bilateral 06/02/2017   Procedure: HYDROCELECTOMY ADULT;  Surgeon: Adelbert Homans, MD;  Location: [facility];  Service: Urology;  Laterality: Bilateral;  ONLY NEEDS 45 MIN   INGUINAL HERNIA REPAIR     RIGHT GROIN   KNEE ARTHROSCOPY Left 05/23/2019   Procedure: LEFT KNEE ARTHROSCOPY AND DEBRIDEMENT PARTIAL MEDIAL MENISECTOMY;  Surgeon: Timothy Ford, MD;  Location: [facility];  Service: Orthopedics;  Laterality: Left;   LEFT HEART CATH AND CORONARY ANGIOGRAPHY N/A 02/07/2020   Procedure: LEFT HEART CATH AND CORONARY ANGIOGRAPHY;  Surgeon: Arleen Lacer, MD;  Location: [facility] INVASIVE CV LAB;  Service: Cardiovascular;  Laterality: N/A;   LEFT HEART CATH AND CORONARY  ANGIOGRAPHY N/A 05/22/2020   Procedure: LEFT HEART CATH AND CORONARY ANGIOGRAPHY;  Surgeon: Lucendia Rusk, MD;  Location: [facility] INVASIVE CV LAB;  Service: Cardiovascular;  Laterality: N/A;   LEFT HEART CATH AND CORONARY ANGIOGRAPHY N/A 08/15/2020   Procedure: LEFT HEART CATH AND CORONARY ANGIOGRAPHY;  Surgeon: Avanell Leigh, MD;  Location: [facility] INVASIVE CV LAB;  Service: Cardiovascular;  Laterality: N/A;   RIGHT/LEFT HEART CATH AND CORONARY/GRAFT ANGIOGRAPHY N/A 07/14/2023   Procedure: RIGHT/LEFT HEART CATH AND CORONARY/GRAFT ANGIOGRAPHY;  Surgeon: Arleen Lacer, MD;  Location: [facility] INVASIVE CV LAB;  Service: Cardiovascular;  Laterality: N/A;   TEE WITHOUT CARDIOVERSION N/A 08/21/2020   Procedure: TRANSESOPHAGEAL ECHOCARDIOGRAM (TEE);  Surgeon: Zelphia Higashi, MD;  Location: [facility] OR;  Service: Open Heart Surgery;  Laterality: N/A;   TRIGGER FINGER RELEASE Right 01/08/2015   Procedure: RELEASE TRIGGER FINGER/A-1 PULLEY RIGHT MIDDLE FINGER;  Surgeon: Lyanne Sample, MD;  Location: [facility];  Service: Orthopedics;  Laterality: Right;    Family History  Problem Relation Age of Onset   Hyperlipidemia Mother    Hypertension Mother    Hyperlipidemia Father    Hypertension Father    Diabetes Father    Dementia Father    Diabetes Maternal Grandfather       Controlled substance contract: n/a     Review of Systems  Constitutional:  Negative for diaphoresis.  Eyes:  Negative for pain.  Respiratory:  Negative for shortness of breath.   Cardiovascular:  Negative for chest pain, palpitations and leg swelling.  Gastrointestinal:  Negative for abdominal pain.  Endocrine: Negative for polydipsia.  Skin:  Negative for rash.  Neurological:  Negative for dizziness, weakness and headaches.  Hematological:  Does not bruise/bleed easily.  All other systems reviewed and are negative.      Objective:   Physical Exam Vitals and nursing note reviewed.  Constitutional:       Appearance: Normal appearance. He is well-developed.  HENT:     Head: Normocephalic.     Nose: Nose normal.     Mouth/Throat:     Mouth: Mucous membranes are moist.     Pharynx: Oropharynx is clear.  Eyes:     Pupils: Pupils are equal, round, and reactive to light.  Neck:     Thyroid: No thyroid mass or thyromegaly.  Vascular: No carotid bruit or JVD.     Trachea: Phonation normal.  Cardiovascular:     Rate and Rhythm: Normal rate and regular rhythm.  Pulmonary:     Effort: Pulmonary effort is normal. No respiratory distress.     Breath sounds: Normal breath sounds.  Abdominal:     General: Bowel sounds are normal.     Palpations: Abdomen is soft.     Tenderness: There is no abdominal tenderness.  Musculoskeletal:        General: Normal range of motion.     Cervical back: Normal range of motion and neck supple.     Right lower leg: No edema.     Left lower leg: No edema.  Lymphadenopathy:     Cervical: No cervical adenopathy.  Skin:    General: Skin is warm and dry.  Neurological:     Mental Status: He is alert and oriented to person, place, and time.  Psychiatric:        Behavior: Behavior normal.        Thought Content: Thought content normal.        Judgment: Judgment normal.    BP 125/67   Pulse 88   Temp (!) 97.5 F (36.4 C) (Temporal)   Ht 5\' 8"  (1.727 m)   Wt 248 lb (112.5 kg)   SpO2 93%   BMI 37.71 kg/m    HGBA1c 7.3% 07/14/23     Assessment & Plan:  [name] [name] comes in today with chief complaint of Medical Management of Chronic Issues   Diagnosis and orders addressed:  1. Essential hypertension, benign Low sodium diet - CBC with Differential/Platelet - CMP14+EGFR - lisinopril  (ZESTRIL ) 2.5 MG tablet; Take 1 tablet (2.5 mg total) by mouth daily.  Dispense: 90 tablet; Refill: 1  2. Accelerating angina (HCC)  3. Coronary artery disease involving native coronary artery of native heart with unstable angina pectoris [facility]) Keep follow up  with cardiology  4. Hyperlipidemia associated with type 2 diabetes mellitus (HCC) Low fat diet - Lipid panel - atorvastatin  (LIPITOR ) 80 MG tablet; Take 1 tablet (80 mg total) by mouth daily.  Dispense: 90 tablet; Refill: 1  5. Type 2 diabetes mellitus with complication, with long-term current use of insulin  (HCC) Continue to watch carbs in diet - Bayer DCA Hb A1c Waived - Microalbumin / creatinine urine ratio - insulin  glargine (LANTUS ) 100 UNIT/ML Solostar Pen; 40u in am and 70 u in evening  Dispense: 15 mL; Refill: 11 - metFORMIN  (GLUCOPHAGE ) 500 MG tablet; Take 2 tablets (1,000 mg total) by mouth 2 (two) times daily.  Dispense: 120 tablet; Refill: 1 - NOVOLOG  FLEXPEN 100 UNIT/ML FlexPen; INJECT 40 TO 75 UNITS SUBCUTANEOUSLY TWICE DAILY PER SLIDING SCALE  Dispense: 15 mL; Refill: 3  6. Gastroesophageal reflux disease without esophagitis Avoid spicy foods Do not eat 2 hours prior to bedtime  - pantoprazole  (PROTONIX ) 40 MG tablet; Take 1 tablet (40 mg total) by mouth daily as needed (for acid reflux).  Dispense: 90 tablet; Refill: 1  7. Benign prostatic hyperplasia with urinary hesitancy - tamsulosin  (FLOMAX ) 0.4 MG CAPS capsule; Take 1 capsule (0.4 mg total) by mouth at bedtime.  Dispense: 90 capsule; Refill: 1  8. BMI 36.0-36.9,adult Discussed diet and exercise for person with BMI >25 Will recheck weight in 3-6 months   9. Type 2 diabetes mellitus with other circulatory complication, with long-term current use of insulin  (HCC) - Semaglutide , 2 MG/DOSE, (OZEMPIC , 2 MG/DOSE,) 8 MG/3ML SOPN; Inject 2  mg into the skin once a week.  Dispense: 9 mL; Refill: 5   Labs pending Health Maintenance reviewed Diet and exercise encouraged  Follow up plan: 6 months   Mary-Margaret Gaylyn Keas, FNP

## 2023-08-17 NOTE — Patient Instructions (Signed)

## 2023-08-19 ENCOUNTER — Ambulatory Visit: Payer: Self-pay | Admitting: Nurse Practitioner

## 2023-09-08 ENCOUNTER — Ambulatory Visit: Payer: Self-pay

## 2023-09-08 NOTE — Telephone Encounter (Signed)
 FYI Only or Action Required?: Action required by provider  Patient was last seen in primary care on 08/17/2023 by Delfina Feller, FNP. Called Nurse Triage reporting Nasal Congestion. Symptoms began Saturday. Interventions attempted: OTC medications: cold products. Symptoms are: gradually worsening.  Triage Disposition: See Physician Within 24 Hours  Patient/caregiver understands and will follow disposition?: Yes    Copied from CRM [id number]. Topic: Clinical - Red Word Triage >> Sep 08, 2023  1:35 PM Emylou G wrote: [facility] that prompted transfer to Nurse Triage: Wife Jammy Plotkin called.. cough green mucus, sinus.Aaron Aas getting worse.. head pain ( pressure ) Reason for Disposition  SEVERE coughing spells (e.g., whooping sound after coughing, vomiting after coughing)    While attempting to schedule appt for pt: wife stated she received information from the PCP stating the pt can be seen in the office tomorrow: wife was unsure of time.  Wife asked if nurse could schedule the appt: nurse informed wife nurse would only be able to schedule current openings & the appt she is being offered is something the PCP may have to create / work you in therefore they will inform the pt & wife of the actual date & time of the appt.  Offered to schedule pt with any of the open appts with other providers: wife refused & stated they would wait for the notification regarding the other appointment.  Answer Assessment - Initial Assessment Questions 1. LOCATION: Where does it hurt?      Under bilateral eye 2. ONSET: When did the sinus pain start?  (e.g., hours, days)      Saturday 3. SEVERITY: How bad is the pain?   (Scale 1-10; mild, moderate or severe)   - MILD (1-3): doesn't interfere with normal activities    - MODERATE (4-7): interferes with normal activities (e.g., work or school) or awakens from sleep   - SEVERE (8-10): excruciating pain and patient unable to do any normal activities        Moderate but  cough is severe 4. RECURRENT SYMPTOM: Have you ever had sinus problems before? If Yes, ask: When was the last time? and What happened that time?      Yes - and Abx relieved 5. NASAL CONGESTION: Is the nose blocked? If Yes, ask: Can you open it or must you breathe through your mouth?     N/a 6. NASAL DISCHARGE: Do you have discharge from your nose? If so ask, What color?     N/a 7. FEVER: Do you have a fever? If Yes, ask: What is it, how was it measured, and when did it start?      no 8. OTHER SYMPTOMS: Do you have any other symptoms? (e.g., sore throat, cough, earache, difficulty breathing)     Cough, green mucous, SOB at times, mostly right pain ear,  4/10, some sore throat, drainage 9. PREGNANCY: Is there any chance you are pregnant? When was your last menstrual period?     N/a  Answer Assessment - Initial Assessment Questions 1. ONSET: When did the cough begin?      Saturday 2. SEVERITY: How bad is the cough today?      severe 3. SPUTUM: Describe the color of your sputum (none, dry cough; clear, white, yellow, green)     Green sputum 4. HEMOPTYSIS: Are you coughing up any blood? If so ask: How much? (flecks, streaks, tablespoons, etc.)     N/a 5. DIFFICULTY BREATHING: Are you having difficulty breathing? If Yes, ask: How bad  is it? (e.g., mild, moderate, severe)    - MILD: No SOB at rest, mild SOB with walking, speaks normally in sentences, can lie down, no retractions, pulse < 100.    - MODERATE: SOB at rest, SOB with minimal exertion and prefers to sit, cannot lie down flat, speaks in phrases, mild retractions, audible wheezing, pulse 100-120.    - SEVERE: Very SOB at rest, speaks in single words, struggling to breathe, sitting hunched forward, retractions, pulse > 120      Mild only at times 6. FEVER: Do you have a fever? If Yes, ask: What is your temperature, how was it measured, and when did it start?     no 7. CARDIAC HISTORY: Do you  have any history of heart disease? (e.g., heart attack, congestive heart failure)      N/a 8. LUNG HISTORY: Do you have any history of lung disease?  (e.g., pulmonary embolus, asthma, emphysema)     N/a 9. PE RISK FACTORS: Do you have a history of blood clots? (or: recent major surgery, recent prolonged travel, bedridden)     N/a 10. OTHER SYMPTOMS: Do you have any other symptoms? (e.g., runny nose, wheezing, chest pain)       Congestion, green mucous, head pain/pressure 11. PREGNANCY: Is there any chance you are pregnant? When was your last menstrual period?       N/a 12. TRAVEL: Have you traveled out of the country in the last month? (e.g., travel history, exposures)       N/a  Protocols used: Sinus Pain or Congestion-A-AH, Cough - Acute Productive-A-AH

## 2023-09-08 NOTE — Telephone Encounter (Signed)
 Appt scheduled for tomorrow. Pt aware.

## 2023-09-09 ENCOUNTER — Other Ambulatory Visit: Payer: Self-pay | Admitting: Nurse Practitioner

## 2023-09-09 ENCOUNTER — Ambulatory Visit: Admitting: Nurse Practitioner

## 2023-09-09 MED ORDER — AZITHROMYCIN 250 MG PO TABS: ORAL_TABLET | ORAL | 0 refills | Status: DC

## 2023-10-05 DIAGNOSIS — M79674 Pain in right toe(s): Secondary | ICD-10-CM | POA: Diagnosis not present

## 2023-10-05 DIAGNOSIS — L84 Corns and callosities: Secondary | ICD-10-CM | POA: Diagnosis not present

## 2023-10-05 DIAGNOSIS — E1142 Type 2 diabetes mellitus with diabetic polyneuropathy: Secondary | ICD-10-CM | POA: Diagnosis not present

## 2023-10-05 DIAGNOSIS — M79675 Pain in left toe(s): Secondary | ICD-10-CM | POA: Diagnosis not present

## 2023-10-05 DIAGNOSIS — B351 Tinea unguium: Secondary | ICD-10-CM | POA: Diagnosis not present

## 2023-10-08 ENCOUNTER — Encounter: Payer: Self-pay | Admitting: Nurse Practitioner

## 2023-10-08 ENCOUNTER — Ambulatory Visit: Attending: Nurse Practitioner | Admitting: Nurse Practitioner

## 2023-10-08 VITALS — BP 126/70 | HR 91 | Ht 68.0 in | Wt 253.2 lb

## 2023-10-08 DIAGNOSIS — I2510 Atherosclerotic heart disease of native coronary artery without angina pectoris: Secondary | ICD-10-CM | POA: Diagnosis not present

## 2023-10-08 DIAGNOSIS — E669 Obesity, unspecified: Secondary | ICD-10-CM

## 2023-10-08 DIAGNOSIS — I10 Essential (primary) hypertension: Secondary | ICD-10-CM

## 2023-10-08 DIAGNOSIS — E782 Mixed hyperlipidemia: Secondary | ICD-10-CM | POA: Diagnosis not present

## 2023-10-08 DIAGNOSIS — R0609 Other forms of dyspnea: Secondary | ICD-10-CM | POA: Diagnosis not present

## 2023-10-08 NOTE — Progress Notes (Unsigned)
 Cardiology Office Note:  .   Date: 10/08/2023 ID:  [name] [name], DOB 08-Oct-1955, MRN [id number] PCP: Gladis Mustard, FNP  [facility] Providers Cardiologist:  [name], MD    History of Present Illness: .   [name] [name] is a 68 y.o. male with a PMH of CAD, s/p CABG in 2022, mixed HLD, HTN, T2DM, and obesity, who presents today for scheduled follow-up.  Previous cardiovascular history of drug-eluting stent to the distal circumflex in 2005.  In 2012, he received drug-eluting stent to the LAD/diagonal bifurcation.  In 2021 he received drug-eluting stent to the ostial LAD and drug-eluting stent to the posterolateral in 2021, ultimately underwent CABG in May 2022 with SVG-diagonal, LIMA-LAD, SVG-OM1 and OM 3.  EF at that time was 60-65%.  04/01/2023 - Today he presents for follow-up. Admits to chest pain ever since his surgery that is noticed during cold/ rainy/wet weather. Doesn't notice this in the summer months. States it occurs across his chest, describes pain as a burning sensation or a hot poker is in his chest. Denies any shortness of breath, palpitations, syncope, presyncope, dizziness, orthopnea, PND, swelling or significant weight changes, acute bleeding, or claudication.  Seen by Dr. Sierras on June 29, 2023.  Patient continued to note exertional angina and DOE despite medication adjustments from previous office visit. Due to symptoms, he said it limited his ability to exercise. Lexiscan  Myoview  was arranged - see report below.   07/05/2023 - Presents today for follow-up.  Says he has not noticed any improvement/worsening of his symptoms since I last saw him. Denies any palpitations, syncope, presyncope, dizziness, orthopnea, PND, swelling or significant weight changes, acute bleeding, or claudication.    08/06/2023 - Presents today for follow-up postcardiac catheterization.  Says he does seem to be breathing better, however not completely resolved.  Does have some  swelling located along the right wrist from cardiac catheterization, says this looks better than it did before. Says he has been noticing DOE since his bypass surgery. Denies any chest pain,  palpitations, syncope, presyncope, dizziness, orthopnea, PND, significant weight changes, acute bleeding, or claudication.  10/08/2023 - Doing well. Breathing has improved since last office visit. Denies any recent chest pain, shortness of breath, palpitations, syncope, presyncope, dizziness, orthopnea, PND, swelling or significant weight changes, acute bleeding, or claudication.  ROS: Negative. See HPI.  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   Studies Reviewed: SABRA    EKG: EKG is not ordered today.  Coronary stent intervention 06/2023:   Prox RCA lesion is 80% stenosed.   Dist LM to Prox LAD lesion is 100% stenosed.   Dist LAD lesion is 60% stenosed.   Mid LAD-1 lesion is 20% stenosed with 100% stenosed side branch in 2nd Diag.   Prox LAD to Mid LAD lesion is 20% stenosed.   Ost Cx to Prox Cx lesion is 70% stenosed.   Origin to Prox Graft lesion is 100% stenosed.   Origin to Prox Graft lesion before Ramus  is 100% stenosed.   1st Mrg lesion is 85% stenosed.   1st LPL-2 lesion is 80% stenosed.   LPAV-1 lesion is 50% stenosed with 90% stenosed side branch in 1st LPL.   LPAV-2 lesion is 100% stenosed.   Non-stenotic Mid LAD-2 lesion was previously treated.   Non-stenotic 1st LPL-1 lesion was previously treated.   Non-stenotic Ost LM to Mid LM lesion.   A drug-eluting stent  was successfully placed using a STENT ONYX FRONTIER 2.5X12.   A drug-eluting stent was successfully placed using a SYNERGY XD 4.0X12.   A stent was successfully placed.   Post intervention, there is a 100% residual stenosis.   Post intervention, there is a 0% residual stenosis.   Post intervention, there is a 0% residual stenosis.   Post intervention, there  is a 0% residual stenosis.   Post intervention, the side branch was reduced to 0% residual stenosis.   LIMA and is normal in caliber.   SVG due to known occlusion.   Seq SVG- SVG-OM1/RI-OM 3 due to known occlusion.   The graft exhibits no disease.   Recommend uninterrupted dual antiplatelet therapy with Aspirin  81mg  daily and Prasugrel  10mg  daily for a minimum of 12 months (ACS-Class I recommendation).   Successful PCI of the third OM with DES Successful PCI of the ostial LCX with DES Unsuccessful PCI of the distal LCx. This is occluded. Unable to cross with a balloon   Plan: anticipate same day DC. DAPT for one year  Limited echo 06/2023: 1. Left ventricular ejection fraction, by estimation, is 55 to 60%. The  left ventricle has normal function.   2. Right ventricular systolic function is normal.   3. No clear evidence of pericardial effusion though technically difficult  study.   Right/left heart cath 06/2023:    Prox LAD lesion is 100% stenosed.  Prox LAD to Mid LAD stent is 20% stenosed. Mid LAD-1 lesion is 20% stenosed with 100% stenosed side branch in 2nd Diag.  There is retrograde filling to the occlusion from the LIMA graft.   LIMA-dLAD graft was visualized by angiography and is normal in caliber. The graft exhibits no disease.   Dist LAD lesion is 60% stenosed beyond graft insertion.   Ost Cx to Prox Cx lesion is 70% stenosed.   1st Mrg lesion is 85% stenosed.   LPAV-1 lesion is 50% stenosed with 90% stenosed side branch in 1st LPL. 1st LPL-1 stent is widely patent. 1st LPL-2 lesion is 80% stenosed.   LPAV-2 lesion is 95% stenosed.   Prox RCA lesion is 80% stenosed.   --------------------------------------------   -----------------Grafts: Patent LIMA to LAD but otherwise occluded vein graft.--------------   SVG-1st Diag graft was not visualized due to known occlusion. Origin to Prox Graft lesion is 100% stenosed.   Seq SVG- SVG-OM1/RI-OM 3 graft was not visualized due to  occlusion. Origin to Prox Graft lesion before Ramus is 100% stenosed.   --------------------------------------------   Hemodynamic findings consistent with borderline pulmonary hypertension.   There is no aortic valve stenosis.       Multivessel Disease: -100% flush occlusion ostial LAD => fills via LIMA to distal LAD with retrograde filling back up to most of the vessel, but occluded SVG-diagonal - Ostial LCx focal 70% stenosis followed by major Lateral Marginal (PL) with ostial 70% and then known subtotal occlusion of the distal AVG LCx to the PDA - Small nondominant RCA with mid 80% stenosis-stable   Grafts: Patent LIMA to LAD but flush occlusion of SVG-D1 and SVG-OM1-OM3. RHC: RA mean 11 mmHg, RV-EDP 37/1-10 mmHg; PAP-mean 32/13-21 mmHg; PCP mean 18 mmHg. Ao sat 93%, PA sat 63%. Fick CO-CI 5.71-2.55  Arterial hemodynamics: LVEDP-EDP 142/9-18 mmHg; ALP-MAP 136/67-99 mmHg.     RECOMMENDATION   In the absence of any other complications or medical issues, we expect the patient to be ready for discharge from a cath perspective on 07/14/2023.   Plan will  be for staged PCI to the circumflex and multiple locations on Monday morning 07/19/2023: Will be scheduled for first case.   Recommend uninterrupted dual antiplatelet therapy with Prasugrel  10mg  daily for a minimum of 6 months (stable ischemic heart disease-Class I recommendation).   Load with 60 mg Effient  and will be discharged on 10 mg daily. With plan for staged PCI to the LCx in multiple locations, with plan to continue thienopyridine coverage beyond 6 months, at 6 months could convert to complete available monotherapy for long-term management.      Lexiscan  Myoview  06/2023:   Findings are consistent with large area of  ischemia in both the lateral and inferior/inferoapical walls. The study is high risk.   No ST deviation was noted.   LV perfusion is abnormal. Large moderate intensity lateral defect nearly completely reversible. Large  moderate intensity inferior/infero apical defect nearly completely reversible. SSS 18 SRS 4 SDS 14.   Left ventricular function is abnormal. Nuclear stress EF: 43%. The left ventricular ejection fraction is moderately decreased (30-44%). End diastolic cavity size is normal.  Echo intraoperative TEE 07/2020:  POST-OP IMPRESSIONS  - Left Ventricle: The left ventricle is unchanged from pre-bypass.  - Right Ventricle: The right ventricle appears unchanged from pre-bypass.  - Aorta: The aorta appears unchanged from pre-bypass.  - Left Atrium: The left atrium appears unchanged from pre-bypass.  - Left Atrial Appendage: The left atrial appendage appears unchanged from  pre-bypass.  - Aortic Valve: The aortic valve appears unchanged from pre-bypass.  - Mitral Valve: The mitral valve appears unchanged from pre-bypass.  - Tricuspid Valve: The tricuspid valve appears unchanged from pre-bypass.  - Pulmonic Valve: The pulmonic valve appears unchanged from pre-bypass.  - Interatrial Septum: The interatrial septum appears unchanged from  pre-bypass.  - Interventricular Septum: The interventricular septum appears unchanged  from  pre-bypass.  - Pericardium: The pericardium appears unchanged from pre-bypass.  Vascular doppler pre-CABG 07/2020:  Summary:  Right Carotid: Velocities in the right ICA are consistent with a 1-39%  stenosis.   Left Carotid: Velocities in the left ICA are consistent with a 1-39%  stenosis.  Vertebrals: Bilateral vertebral arteries demonstrate antegrade flow.   Right Upper Extremity: Doppler waveforms remain within normal limits with  right radial compression. Doppler waveforms remain within normal limits  with right ulnar compression.  Left Upper Extremity: Doppler waveforms remain within normal limits with left radial compression. Doppler waveforms decrease 50% with left ulnar compression.    Echo 07/2020:  1. Left ventricular ejection fraction, by estimation, is 60 to  65%. The  left ventricle has normal function. Left ventricular endocardial border  not optimally defined to evaluate regional wall motion. Grossly, no wall  motion abnormalities seen. There is  mild concentric left ventricular hypertrophy. Left ventricular diastolic  parameters are consistent with Grade I diastolic dysfunction (impaired  relaxation). Elevated left atrial pressure.   2. Right ventricular systolic function is normal. The right ventricular  size is normal. Tricuspid regurgitation signal is inadequate for assessing  PA pressure.   3. The mitral valve is normal in structure. No evidence of mitral valve  regurgitation.   4. The aortic valve is tricuspid. There is mild calcification of the  aortic valve. Aortic valve regurgitation is not visualized. Mild aortic  valve sclerosis is present, with no evidence of aortic valve stenosis.   Comparison(s): No significant change from prior study. Prior echo in  01/2020 demonstrated mild ascending aorta dilation (40mm) which was not  well visualized on  current study. Will need continued monitoring as  outpatient.  LHC 07/2020:  Prox RCA lesion is 70% stenosed. LPAV lesion is 100% stenosed. Ost LAD to Prox LAD lesion is 95% stenosed. Mid LAD lesion is 80% stenosed. 2nd Mrg lesion is 90% stenosed. Previously placed 1st LPL stent (unknown type) is widely patent.  LHC 04/2020:  Prox RCA lesion is 70% stenosed. Dist LAD lesion is 60% stenosed. 1st Mrg lesion is 85% stenosed. 1st LPL-2 lesion is 80% stenosed. Previously placed 1st LPL-1 drug eluting stent is widely patent. LPAV-2 lesion is 100% stenosed. Dist LM to Prox LAD lesion is 90% stenosed, instent restenosis. Treated with 3.25 mm [place] and then 4.0 [place] balloon. Dissection noted by OCT past the edge of old stent. A drug-eluting stent was successfully placed using a SYNERGY XD 3.50X12., post dilated to 4.0 mm. Post intervention, there is a 0% residual stenosis. Stent optimized  with OCT. Mid LAD-1 lesion is 20% stenosed. Mid LAD-2 lesion is 75% stenosed. In-stent restenosis. Scoring balloon angioplasty was performed using a BALLOON [place]OL[place] 3.25X10. Post intervention, there is a 0% residual stenosis. The left ventricular systolic function is normal. LV end diastolic pressure is normal. The left ventricular ejection fraction is 55-65% by visual estimate. There is no aortic valve stenosis. A drug-eluting stent was successfully placed using a SYNERGY XD 3.50X12.   Continue dual antiplatelet therapy along with aggressive secondary prevention.    Diffuse, distal and small vessel disease in the LAD and OM territories.  Aggressive medical therapy.   Coronary atherectomy, LHC 01/2020: Prox RCA lesion is 70% stenosed. Dist LM to Prox LAD lesion is 95% stenosed. Dist LAD lesion is 60% stenosed. Mid LAD lesion is 20% stenosed. Prox LAD to Mid LAD lesion is 20% stenosed. 1st Mrg lesion is 85% stenosed. 1st LPL-1 lesion is 95% stenosed. 1st LPL-2 lesion is 80% stenosed. LPAV-1 lesion is 30% stenosed. LPAV-2 lesion is 100% stenosed. Post intervention, there is a 0% residual stenosis. A drug-eluting stent was successfully placed using a SYNERGY XD 3.50X12. Post intervention, there is a 0% residual stenosis. A drug-eluting stent was successfully placed using a SYNERGY XD 2.25X16.   1. Successful IVUS guided orbital atherectomy and drug eluting stent placement to the ostial LAD. 2.  Successful angioplasty and drug-eluting stent placement to first PL branch of the left circumflex.     Recommendations: Continue indefinite dual antiplatelet therapy. Aggressive treatment of risk factors.   LHC 01/2020: Ostial LAD lesion is 95% stenosed. (Image does not allow drawing ostial LAD lesions) Prox LAD to Mid LAD lesion is 20% stenosed. Mid LAD STENT is 20% stenosed Dist LAD lesion is 60% stenosed. Very small caliber 1st Mrg lesion is 85% stenosed. No change from 2012 LPAV-1  lesion is 30% stenosed just prior to giving off LPL 1. LPAV-2 STENT is 100% stenosed -which leaves the L PDA occluded 1st LPL-1 lesion is 95% stenosed. 1st LPL-2 lesion is 80% stenosed. Prox RCA lesion is 70% stenosed. ---------------------------- The left ventricular ejection fraction is 35-45% by visual estimate. There is moderate left ventricular systolic dysfunction. LV end diastolic pressure is normal.   SUMMARY Severe multivessel CAD:  Heavily calcified eccentric 95% ostial LAD with mild in-stent restenosis of proximal-mid LAD stent and downstream diffuse mild to moderate disease with a focal 60% lesion;  small caliber ramus intermedius mild diffuse disease, heavily diseased OM1 (no change from 2012).   Very patulous dilated dominant LCx: 100% CTO of previously placed Stent leading from the AV groove  circumflex to the PDA (no collateral)  95% mid LPL stenosis.  Nondominant RCA with proximal 70%. At least moderately reduced LVEF estimated roughly 45% with apical anterior hypokinesis. Severely elevated LVEDP - 28 mm.     RECOMMENDATIONS Very difficult situation with significant disease.  Need to review with heart team approach discussed between CVTS surgeons and other IC physicians.  Brief discussion with Dr. Army would indicate that only the distal LAD would be a target for LIMA.  Dr. Darron feels that the ostial LAD could be treated with atherectomy and stenting as could the LPL lesion.  Perhaps this would be a better option, however we need to assess EF. Anticipate full CVTS consultation note tomorrow.  We will hold off on stopping Effient  until the decision is made about CABG versus PCI If CABG: Would not be until Wednesday 11/17 If PCI: Would wait until Friday 11/12 with Dr. Darron -> depending on EF, may or may not need to consider Impella support With unstable angina presentation-chest pain and dyspnea with minimal exertion, I do not feel comfortable discharging this gentleman  till a full plan is in place.  We will admit him to telemetry on the Interventional Team Service to allow time for HEART TEAM DISCUSSION for best option going forward. Check 2D echocardiogram The patient has a relatively difficult diabetes regimen, will start with a Lantus  (at the lower end of his range) with mealtime and sliding scale coverage.  Diabetes education team to assist since he has a [employer]' monitor. Continue to titrate antianginals per IC Team (Team C)   Physical Exam:   VS:  BP 126/70 (BP Location: Left Arm)   Pulse 91   Ht 5' 8 (1.727 m)   Wt 253 lb 3.2 oz (114.9 kg)   SpO2 93%   BMI 38.50 kg/m    Wt Readings from Last 3 Encounters:  10/08/23 253 lb 3.2 oz (114.9 kg)  08/17/23 248 lb (112.5 kg)  08/06/23 255 lb (115.7 kg)    GEN: Obese, 68 y.o. male in no acute distress NECK: No JVD; No carotid bruits CARDIAC: S1/S2, RRR, no murmurs, rubs, gallops RESPIRATORY:  Clear to auscultation without rales, wheezing or rhonchi  ABDOMEN: Soft, non-tender, non-distended EXTREMITIES: Minimal, trace edema to right radial site, no pain per his report and improving per his report, otherwise no edema; No deformity   ASSESSMENT AND PLAN: .    CAD, s/p CABG in 2022, DOE Admits to improved DOE. See recent left heart catheterization results as noted above.  Underwent successful PCI of third OM with DES and PCI of ostial left circumflex with DES, did have occlusion of distal left circumflex and was unable to do PCI of this location.  Recommended DAPT for 1 year.  No explanation of DOE noted during right heart catheterization.  Most recent limited echo revealed EF 55 to 60%.  Continue current medication regimen. Heart healthy diet and regular cardiovascular exercise encouraged.   Care and ED precautions discussed.    Mixed HLD LDL 39 06/2023. Patient is requesting to work on lifestyle changes first.  Mediterranean diet and regular cardiovascular exercise encouraged.  Continue current  medication regimen. Continue to follow with PCP.  HTN Blood pressure stable. Discussed to monitor BP at home at least 2 hours after medications and sitting for 5-10 minutes.  Continue current medication regimen.  Heart healthy diet and regular cardiovascular exercise encouraged.  Obesity Weight loss via diet and exercise encouraged. Discussed the impact being overweight would have  on cardiovascular risk.   Dispo: Follow-up with MD/APP in 6 months or sooner if anything changes.  Signed, Almarie Crate, NP

## 2023-10-08 NOTE — Patient Instructions (Addendum)

## 2023-10-10 ENCOUNTER — Other Ambulatory Visit: Payer: Self-pay | Admitting: Nurse Practitioner

## 2023-10-10 DIAGNOSIS — Z794 Long term (current) use of insulin: Secondary | ICD-10-CM

## 2023-10-12 DIAGNOSIS — L821 Other seborrheic keratosis: Secondary | ICD-10-CM | POA: Diagnosis not present

## 2023-10-12 DIAGNOSIS — L853 Xerosis cutis: Secondary | ICD-10-CM | POA: Diagnosis not present

## 2023-10-12 DIAGNOSIS — L814 Other melanin hyperpigmentation: Secondary | ICD-10-CM | POA: Diagnosis not present

## 2023-10-21 ENCOUNTER — Other Ambulatory Visit: Payer: Self-pay | Admitting: Nurse Practitioner

## 2023-10-22 ENCOUNTER — Encounter: Payer: Self-pay | Admitting: Nurse Practitioner

## 2023-10-22 DIAGNOSIS — L03032 Cellulitis of left toe: Secondary | ICD-10-CM | POA: Diagnosis not present

## 2023-10-22 DIAGNOSIS — M79674 Pain in right toe(s): Secondary | ICD-10-CM | POA: Diagnosis not present

## 2023-10-22 DIAGNOSIS — M79675 Pain in left toe(s): Secondary | ICD-10-CM | POA: Diagnosis not present

## 2023-10-22 MED ORDER — LANTUS SOLOSTAR 100 UNIT/ML ~~LOC~~ SOPN: PEN_INJECTOR | SUBCUTANEOUS | 99 refills | Status: DC

## 2023-10-22 MED ORDER — LANTUS SOLOSTAR 100 UNIT/ML ~~LOC~~ SOPN: PEN_INJECTOR | SUBCUTANEOUS | 99 refills | Status: AC

## 2023-10-23 ENCOUNTER — Other Ambulatory Visit: Payer: Self-pay | Admitting: Nurse Practitioner

## 2023-10-29 ENCOUNTER — Other Ambulatory Visit: Payer: Self-pay | Admitting: *Deleted

## 2023-10-29 DIAGNOSIS — E118 Type 2 diabetes mellitus with unspecified complications: Secondary | ICD-10-CM

## 2023-10-29 MED ORDER — NOVOLOG FLEXPEN 100 UNIT/ML ~~LOC~~ SOPN: 48.0000 [IU] | PEN_INJECTOR | Freq: Two times a day (BID) | SUBCUTANEOUS | 0 refills | Status: DC

## 2023-10-29 NOTE — Telephone Encounter (Signed)
 Pt came in to get refill on his Novolog  flexpen which was sent in on 10/11/23 TC to pharmacy, 15 ml only last 10 days RF sent in for 45 ml

## 2023-11-09 DIAGNOSIS — M79675 Pain in left toe(s): Secondary | ICD-10-CM | POA: Diagnosis not present

## 2023-11-09 DIAGNOSIS — L03032 Cellulitis of left toe: Secondary | ICD-10-CM | POA: Diagnosis not present

## 2023-11-09 DIAGNOSIS — M79674 Pain in right toe(s): Secondary | ICD-10-CM | POA: Diagnosis not present

## 2023-11-15 ENCOUNTER — Other Ambulatory Visit: Payer: Self-pay | Admitting: Nurse Practitioner

## 2023-11-15 DIAGNOSIS — E1159 Type 2 diabetes mellitus with other circulatory complications: Secondary | ICD-10-CM

## 2023-11-15 MED ORDER — DEXCOM G7 SENSOR MISC: 5 refills | Status: DC

## 2023-11-16 ENCOUNTER — Other Ambulatory Visit: Payer: Self-pay

## 2023-11-16 MED ORDER — BLOOD GLUCOSE TEST VI STRP
1.0000 | ORAL_STRIP | Freq: Three times a day (TID) | 0 refills | Status: DC
Start: 2023-11-16 — End: 2023-12-07

## 2023-11-16 MED ORDER — LANCET DEVICE MISC: 1.0000 | Freq: Three times a day (TID) | 0 refills | Status: AC

## 2023-11-16 MED ORDER — BLOOD GLUCOSE MONITORING SUPPL DEVI: 1.0000 | Freq: Three times a day (TID) | 0 refills | Status: AC

## 2023-11-16 MED ORDER — LANCETS MISC. MISC: 1.0000 | Freq: Three times a day (TID) | 0 refills | Status: AC

## 2023-11-17 DIAGNOSIS — E113392 Type 2 diabetes mellitus with moderate nonproliferative diabetic retinopathy without macular edema, left eye: Secondary | ICD-10-CM | POA: Diagnosis not present

## 2023-11-17 DIAGNOSIS — E113491 Type 2 diabetes mellitus with severe nonproliferative diabetic retinopathy without macular edema, right eye: Secondary | ICD-10-CM | POA: Diagnosis not present

## 2023-11-22 ENCOUNTER — Other Ambulatory Visit: Payer: Self-pay | Admitting: Nurse Practitioner

## 2023-11-22 DIAGNOSIS — Z794 Long term (current) use of insulin: Secondary | ICD-10-CM

## 2023-12-07 ENCOUNTER — Other Ambulatory Visit: Payer: Self-pay | Admitting: Nurse Practitioner

## 2023-12-10 ENCOUNTER — Other Ambulatory Visit: Payer: Self-pay | Admitting: Nurse Practitioner

## 2023-12-10 MED ORDER — ACCU-CHEK GUIDE TEST VI STRP: ORAL_STRIP | 3 refills | Status: AC

## 2023-12-10 MED ORDER — ACCU-CHEK SOFTCLIX LANCETS MISC: 3 refills | Status: AC

## 2023-12-10 NOTE — Progress Notes (Signed)
 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

## 2023-12-15 ENCOUNTER — Other Ambulatory Visit: Payer: Self-pay | Admitting: Nurse Practitioner

## 2023-12-15 DIAGNOSIS — E118 Type 2 diabetes mellitus with unspecified complications: Secondary | ICD-10-CM

## 2024-01-06 ENCOUNTER — Other Ambulatory Visit: Payer: Self-pay | Admitting: Nurse Practitioner

## 2024-01-06 DIAGNOSIS — E118 Type 2 diabetes mellitus with unspecified complications: Secondary | ICD-10-CM

## 2024-01-18 DIAGNOSIS — M79674 Pain in right toe(s): Secondary | ICD-10-CM | POA: Diagnosis not present

## 2024-01-18 DIAGNOSIS — B351 Tinea unguium: Secondary | ICD-10-CM | POA: Diagnosis not present

## 2024-01-18 DIAGNOSIS — L84 Corns and callosities: Secondary | ICD-10-CM | POA: Diagnosis not present

## 2024-01-18 DIAGNOSIS — E1142 Type 2 diabetes mellitus with diabetic polyneuropathy: Secondary | ICD-10-CM | POA: Diagnosis not present

## 2024-01-18 DIAGNOSIS — M79675 Pain in left toe(s): Secondary | ICD-10-CM | POA: Diagnosis not present

## 2024-01-25 ENCOUNTER — Ambulatory Visit: Admitting: Nurse Practitioner

## 2024-01-25 ENCOUNTER — Encounter: Payer: Self-pay | Admitting: Nurse Practitioner

## 2024-01-25 VITALS — BP 130/78 | HR 88 | Temp 97.4°F | Ht 68.0 in | Wt 257.0 lb

## 2024-01-25 DIAGNOSIS — Z794 Long term (current) use of insulin: Secondary | ICD-10-CM

## 2024-01-25 DIAGNOSIS — E785 Hyperlipidemia, unspecified: Secondary | ICD-10-CM | POA: Diagnosis not present

## 2024-01-25 DIAGNOSIS — Z0001 Encounter for general adult medical examination with abnormal findings: Secondary | ICD-10-CM

## 2024-01-25 DIAGNOSIS — Z6836 Body mass index (BMI) 36.0-36.9, adult: Secondary | ICD-10-CM

## 2024-01-25 DIAGNOSIS — K219 Gastro-esophageal reflux disease without esophagitis: Secondary | ICD-10-CM | POA: Diagnosis not present

## 2024-01-25 DIAGNOSIS — E118 Type 2 diabetes mellitus with unspecified complications: Secondary | ICD-10-CM | POA: Diagnosis not present

## 2024-01-25 DIAGNOSIS — N401 Enlarged prostate with lower urinary tract symptoms: Secondary | ICD-10-CM | POA: Diagnosis not present

## 2024-01-25 DIAGNOSIS — I10 Essential (primary) hypertension: Secondary | ICD-10-CM

## 2024-01-25 DIAGNOSIS — E1169 Type 2 diabetes mellitus with other specified complication: Secondary | ICD-10-CM | POA: Diagnosis not present

## 2024-01-25 DIAGNOSIS — I200 Unstable angina: Secondary | ICD-10-CM

## 2024-01-25 DIAGNOSIS — Z23 Encounter for immunization: Secondary | ICD-10-CM

## 2024-01-25 DIAGNOSIS — I25110 Atherosclerotic heart disease of native coronary artery with unstable angina pectoris: Secondary | ICD-10-CM | POA: Diagnosis not present

## 2024-01-25 DIAGNOSIS — Z0000 Encounter for general adult medical examination without abnormal findings: Secondary | ICD-10-CM

## 2024-01-25 LAB — BAYER DCA HB A1C WAIVED: HB A1C (BAYER DCA - WAIVED): 6.4 % — ABNORMAL HIGH (ref 4.8–5.6)

## 2024-01-25 MED ORDER — ATORVASTATIN CALCIUM 80 MG PO TABS: 80.0000 mg | ORAL_TABLET | Freq: Every day | ORAL | 1 refills | Status: AC

## 2024-01-25 MED ORDER — LISINOPRIL 5 MG PO TABS: 5.0000 mg | ORAL_TABLET | Freq: Every day | ORAL | 1 refills | Status: AC

## 2024-01-25 MED ORDER — NOVOLOG FLEXPEN 100 UNIT/ML ~~LOC~~ SOPN: PEN_INJECTOR | SUBCUTANEOUS | 5 refills | Status: AC

## 2024-01-25 MED ORDER — METFORMIN HCL 500 MG PO TABS: 1000.0000 mg | ORAL_TABLET | Freq: Two times a day (BID) | ORAL | 1 refills | Status: AC

## 2024-01-25 MED ORDER — CARVEDILOL 6.25 MG PO TABS: 6.2500 mg | ORAL_TABLET | Freq: Two times a day (BID) | ORAL | 1 refills | Status: AC

## 2024-01-25 MED ORDER — TAMSULOSIN HCL 0.4 MG PO CAPS: 0.4000 mg | ORAL_CAPSULE | Freq: Every day | ORAL | 1 refills | Status: DC

## 2024-01-25 MED ORDER — OZEMPIC (2 MG/DOSE) 8 MG/3ML ~~LOC~~ SOPN: 2.0000 mg | PEN_INJECTOR | SUBCUTANEOUS | 5 refills | Status: AC

## 2024-01-25 MED ORDER — FUROSEMIDE 40 MG PO TABS: 40.0000 mg | ORAL_TABLET | Freq: Every day | ORAL | 1 refills | Status: AC

## 2024-01-25 MED ORDER — PANTOPRAZOLE SODIUM 40 MG PO TBEC: 40.0000 mg | DELAYED_RELEASE_TABLET | Freq: Every day | ORAL | 1 refills | Status: AC | PRN

## 2024-01-25 MED ORDER — ISOSORBIDE MONONITRATE ER 30 MG PO TB24: 30.0000 mg | ORAL_TABLET | Freq: Two times a day (BID) | ORAL | 1 refills | Status: AC

## 2024-01-25 NOTE — Progress Notes (Signed)
 Subjective:    Patient ID: [name] [name], male    DOB: 1955/11/21, 68 y.o.   MRN: [id number]   Chief Complaint: annual physical    HPI:  [name] [name] is a 68 y.o. who identifies as a male who was assigned male at birth.   Social history: Lives with: wife Work history: retired buyer, retail   Comes in today for follow up of the following chronic medical issues:  1. Essential hypertension, benign No c/o chest pain, sob or headache. Does not check blood pressure at home. BP Readings from Last 3 Encounters:  10/08/23 126/70  08/17/23 125/67  08/06/23 128/80     2. Accelerating angina (HCC) 3. Coronary artery disease involving native coronary artery of native heart with unstable angina pectoris [facility]) Last saw cardiology on 10/08/23. No changes were made to plan of care  He was to follow up in 6 months. Follow up in January  4. Hyperlipidemia associated with type 2 diabetes mellitus (HCC) Does not watch diet very closely and does very little exercise. Lab Results  Component Value Date   CHOL 82 07/15/2023   HDL 22 (L) 07/15/2023   LDLCALC 39 07/15/2023   TRIG 106 07/15/2023   CHOLHDL 3.7 07/15/2023     5. Type 2 diabetes mellitus with complication, with long-term current use of insulin  (HCC) Fating blood sugars are running around 90-110. Some  low blood sugars at night. Has dexcom and checks it frequently Lab Results  Component Value Date   HGBA1C 7.3 (H) 07/14/2023     6. Gastroesophageal reflux disease without esophagitis S on daily dose of protonix  and is dong well.  7. Benign prostatic hyperplasia with urinary hesitancy No voiding issues. Urology is keeping check of PSA- results not in chart from 2025. Lab Results  Component Value Date   PSA1 2.7 05/18/2022   PSA1 10.0 (H) 05/09/2021   PSA1 2.4 08/05/2020      8. BMI 36.0-36.9,adult Weight is up 4lbs  Wt Readings from Last 3 Encounters:  01/25/24 257 lb (116.6 kg)  10/08/23 253 lb 3.2  oz (114.9 kg)  08/17/23 248 lb (112.5 kg)   BMI Readings from Last 3 Encounters:  01/25/24 39.08 kg/m  10/08/23 38.50 kg/m  08/17/23 37.71 kg/m       New complaints: None today  No Known Allergies Outpatient Encounter Medications as of 01/25/2024  Medication Sig   Blood Glucose Monitoring Suppl DEVI 1 each by Does not apply route in the morning, at noon, and at bedtime. May substitute to any manufacturer covered by patient's insurance.   Accu-Chek Softclix Lancets lancets Test BS 4-6X as needed daioly Dx E11.8   aspirin  EC 81 MG tablet Take 1 tablet (81 mg total) by mouth daily. Swallow whole.   atorvastatin  (LIPITOR ) 80 MG tablet Take 1 tablet (80 mg total) by mouth daily.   azithromycin  (ZITHROMAX  Z-PAK) 250 MG tablet As directed   BD PEN NEEDLE SHORT ULTRAFINE 31G X 8 MM MISC USE 1 PEN NEEDLE UP TO 8 TIMES DAILY AS NEEDED WITH INSULIN  Dx E11.8   blood glucose meter kit and supplies Dispense based on patient and insurance preference. Use up to four times daily as directed. (FOR ICD-10 E10.9, E11.9).   carvedilol  (COREG ) 6.25 MG tablet Take 1 tablet (6.25 mg total) by mouth 2 (two) times daily.   CINNAMON  PO Take 1,000 mg by mouth 2 (two) times daily.   Continuous Glucose Sensor (DEXCOM G7 SENSOR) MISC Change sensor every 10  days   CRANBERRY PO Take 4,200 mg by mouth 2 (two) times daily.   furosemide  (LASIX ) 40 MG tablet Take 1 tablet (40 mg total) by mouth daily.   glucose blood (ACCU-CHEK GUIDE TEST) test strip Test BS 4-6x daily as needed Dx E11.8   insulin  glargine (LANTUS  SOLOSTAR) 100 UNIT/ML Solostar Pen 47u qam and 78u q evening   isosorbide  mononitrate (IMDUR ) 30 MG 24 hr tablet Take 1 tablet (30 mg total) by mouth in the morning and at bedtime.   lisinopril  (ZESTRIL ) 5 MG tablet Take 1 tablet (5 mg total) by mouth daily.   loratadine  (CLARITIN ) 10 MG tablet Take 10 mg by mouth daily.   metFORMIN  (GLUCOPHAGE ) 500 MG tablet Take 2 tablets (1,000 mg total) by mouth 2  (two) times daily.   nitroGLYCERIN  (NITROSTAT ) 0.4 MG SL tablet Place 1 tablet (0.4 mg total) under the tongue every 5 (five) minutes x 3 doses as needed for chest pain (if no relief after 2nd dose, proceed to ED or call 911).   NOVOLOG  FLEXPEN 100 UNIT/ML FlexPen INJECT 48-78 UNITS SUBCUTANEOUSLY TWICE DAILY PER  SLIDING  SCALE   pantoprazole  (PROTONIX ) 40 MG tablet Take 1 tablet (40 mg total) by mouth daily as needed (for acid reflux).   prasugrel  (EFFIENT ) 10 MG TABS tablet Take 1 tablet (10 mg total) by mouth daily.   Semaglutide , 2 MG/DOSE, (OZEMPIC , 2 MG/DOSE,) 8 MG/3ML SOPN Inject 2 mg into the skin once a week.   tamsulosin  (FLOMAX ) 0.4 MG CAPS capsule Take 1 capsule (0.4 mg total) by mouth at bedtime.   No facility-administered encounter medications on file as of 01/25/2024.    Past Surgical History:  Procedure Laterality Date   CARDIAC CATHETERIZATION  2012   CAROTID STENT     stents x 2    CHOLECYSTECTOMY N/A 12/04/2015   Procedure: LAPAROSCOPIC CHOLECYSTECTOMY;  Surgeon: Oneil Budge, MD;  Location: [facility];  Service: General;  Laterality: N/A;   CHOLECYSTECTOMY, LAPAROSCOPIC  12/05/2015   CORONARY ANGIOPLASTY  2012   STENT X 1 2012, STENT X 1 YRS BEFORE   CORONARY ARTERY BYPASS GRAFT N/A 08/21/2020   Procedure: CORONARY ARTERY BYPASS GRAFTING (CABG) X 4, USING LEFT INTERNAL MAMMARY ARTERY AND RIGHT GREATER SAPHENOUS VEIN HARVESTED ENDOSCOPICALLY. SVG TO OM1, OM3 SEQUENTIALLY, SVG TO DIAG., LIMA TO LAD;  Surgeon: Kerrin Elspeth BROCKS, MD;  Location: [facility] OR;  Service: Open Heart Surgery;  Laterality: N/A;   CORONARY ATHERECTOMY N/A 02/09/2020   Procedure: CORONARY ATHERECTOMY;  Surgeon: Darron Deatrice LABOR, MD;  Location: [facility] INVASIVE CV LAB;  Service: Cardiovascular;  Laterality: N/A;   CORONARY IMAGING/OCT N/A 05/22/2020   Procedure: INTRAVASCULAR IMAGING/OCT;  Surgeon: Dann Candyce RAMAN, MD;  Location: [facility] INVASIVE CV LAB;  Service: Cardiovascular;  Laterality: N/A;   CORONARY  LITHOTRIPSY N/A 07/15/2023   Procedure: CORONARY LITHOTRIPSY;  Surgeon: Jordan, Peter M, MD;  Location: [facility] INVASIVE CV LAB;  Service: Cardiovascular;  Laterality: N/A;   CORONARY STENT INTERVENTION N/A 02/09/2020   Procedure: CORONARY STENT INTERVENTION;  Surgeon: Darron Deatrice LABOR, MD;  Location: [facility] INVASIVE CV LAB;  Service: Cardiovascular;  Laterality: N/A;   CORONARY STENT INTERVENTION N/A 05/22/2020   Procedure: CORONARY STENT INTERVENTION;  Surgeon: Dann Candyce RAMAN, MD;  Location: [facility] INVASIVE CV LAB;  Service: Cardiovascular;  Laterality: N/A;   CORONARY STENT INTERVENTION N/A 07/15/2023   Procedure: CORONARY STENT INTERVENTION;  Surgeon: Jordan, Peter M, MD;  Location: [facility] INVASIVE CV LAB;  Service: Cardiovascular;  Laterality: N/A;  CORONARY ULTRASOUND/IVUS N/A 02/09/2020   Procedure: Intravascular Ultrasound/IVUS;  Surgeon: Darron Deatrice LABOR, MD;  Location: [facility] INVASIVE CV LAB;  Service: Cardiovascular;  Laterality: N/A;   ENDOVEIN HARVEST OF GREATER SAPHENOUS VEIN Right 08/21/2020   Procedure: ENDOVEIN HARVEST OF GREATER SAPHENOUS VEIN;  Surgeon: Kerrin Elspeth BROCKS, MD;  Location: [facility] OR;  Service: Open Heart Surgery;  Laterality: Right;   HYDROCELE EXCISION Bilateral 06/02/2017   Procedure: HYDROCELECTOMY ADULT;  Surgeon: Devere Lonni Righter, MD;  Location: [facility];  Service: Urology;  Laterality: Bilateral;  ONLY NEEDS 45 MIN   INGUINAL HERNIA REPAIR     RIGHT GROIN   KNEE ARTHROSCOPY Left 05/23/2019   Procedure: LEFT KNEE ARTHROSCOPY AND DEBRIDEMENT PARTIAL MEDIAL MENISECTOMY;  Surgeon: Harden Jerona GAILS, MD;  Location: [facility];  Service: Orthopedics;  Laterality: Left;   LEFT HEART CATH AND CORONARY ANGIOGRAPHY N/A 02/07/2020   Procedure: LEFT HEART CATH AND CORONARY ANGIOGRAPHY;  Surgeon: Anner Alm ORN, MD;  Location: [facility] INVASIVE CV LAB;  Service: Cardiovascular;  Laterality: N/A;   LEFT HEART CATH AND CORONARY ANGIOGRAPHY N/A 05/22/2020   Procedure: LEFT  HEART CATH AND CORONARY ANGIOGRAPHY;  Surgeon: Dann Candyce RAMAN, MD;  Location: [facility] INVASIVE CV LAB;  Service: Cardiovascular;  Laterality: N/A;   LEFT HEART CATH AND CORONARY ANGIOGRAPHY N/A 08/15/2020   Procedure: LEFT HEART CATH AND CORONARY ANGIOGRAPHY;  Surgeon: Court Dorn PARAS, MD;  Location: [facility] INVASIVE CV LAB;  Service: Cardiovascular;  Laterality: N/A;   RIGHT/LEFT HEART CATH AND CORONARY/GRAFT ANGIOGRAPHY N/A 07/14/2023   Procedure: RIGHT/LEFT HEART CATH AND CORONARY/GRAFT ANGIOGRAPHY;  Surgeon: Anner Alm ORN, MD;  Location: [facility] INVASIVE CV LAB;  Service: Cardiovascular;  Laterality: N/A;   TEE WITHOUT CARDIOVERSION N/A 08/21/2020   Procedure: TRANSESOPHAGEAL ECHOCARDIOGRAM (TEE);  Surgeon: Kerrin Elspeth BROCKS, MD;  Location: [facility] OR;  Service: Open Heart Surgery;  Laterality: N/A;   TRIGGER FINGER RELEASE Right 01/08/2015   Procedure: RELEASE TRIGGER FINGER/A-1 PULLEY RIGHT MIDDLE FINGER;  Surgeon: Arley Curia, MD;  Location: [facility];  Service: Orthopedics;  Laterality: Right;    Family History  Problem Relation Age of Onset   Hyperlipidemia Mother    Hypertension Mother    Hyperlipidemia Father    Hypertension Father    Diabetes Father    Dementia Father    Diabetes Maternal Grandfather       Controlled substance contract: n/a     Review of Systems  Constitutional:  Negative for diaphoresis.  Eyes:  Negative for pain.  Respiratory:  Negative for shortness of breath.   Cardiovascular:  Negative for chest pain, palpitations and leg swelling.  Gastrointestinal:  Negative for abdominal pain.  Endocrine: Negative for polydipsia.  Skin:  Negative for rash.  Neurological:  Negative for dizziness, weakness and headaches.  Hematological:  Does not bruise/bleed easily.  All other systems reviewed and are negative.      Objective:   Physical Exam Vitals and nursing note reviewed.  Constitutional:      Appearance: Normal appearance. He is  well-developed.  HENT:     Head: Normocephalic.     Nose: Nose normal.     Mouth/Throat:     Mouth: Mucous membranes are moist.     Pharynx: Oropharynx is clear.  Eyes:     Pupils: Pupils are equal, round, and reactive to light.  Neck:     Thyroid: No thyroid mass or thyromegaly.     Vascular: No carotid bruit or JVD.  Trachea: Phonation normal.  Cardiovascular:     Rate and Rhythm: Normal rate and regular rhythm.  Pulmonary:     Effort: Pulmonary effort is normal. No respiratory distress.     Breath sounds: Normal breath sounds.  Abdominal:     General: Bowel sounds are normal.     Palpations: Abdomen is soft.     Tenderness: There is no abdominal tenderness.     Hernia: A hernia (umbilical hernia) is present.  Musculoskeletal:        General: Normal range of motion.     Cervical back: Normal range of motion and neck supple.     Right lower leg: No edema.     Left lower leg: No edema.     Comments: Compression hose in place  Lymphadenopathy:     Cervical: No cervical adenopathy.  Skin:    General: Skin is warm and dry.  Neurological:     Mental Status: He is alert and oriented to person, place, and time.  Psychiatric:        Behavior: Behavior normal.        Thought Content: Thought content normal.        Judgment: Judgment normal.    BP 130/78   Pulse 88   Temp (!) 97.4 F (36.3 C) (Temporal)   Ht 5' 8 (1.727 m)   Wt 257 lb (116.6 kg)   SpO2 97%   BMI 39.08 kg/m     HGBA1c 6.4%      Assessment & Plan:   [name] [name] comes in today with chief complaint of Medical Management of Chronic Issues   Diagnosis and orders addressed:  1. Annual physical exam (Primary)   2. Essential hypertension, benign Low sodium diet - CBC with Differential/Platelet - CMP14+EGFR - carvedilol  (COREG ) 6.25 MG tablet; Take 1 tablet (6.25 mg total) by mouth 2 (two) times daily.  Dispense: 180 tablet; Refill: 1 - furosemide  (LASIX ) 40 MG tablet; Take 1 tablet (40 mg  total) by mouth daily.  Dispense: 90 tablet; Refill: 1 - lisinopril  (ZESTRIL ) 5 MG tablet; Take 1 tablet (5 mg total) by mouth daily.  Dispense: 90 tablet; Refill: 1  3. Accelerating angina (HCC) 4. Coronary artery disease involving native coronary artery of native heart with unstable angina pectoris (HCC) Keep follow up with cardiology - isosorbide  mononitrate (IMDUR ) 30 MG 24 hr tablet; Take 1 tablet (30 mg total) by mouth in the morning and at bedtime.  Dispense: 180 tablet; Refill: 1  5. Hyperlipidemia associated with type 2 diabetes mellitus (HCC) Low fat diet - Lipid panel - atorvastatin  (LIPITOR ) 80 MG tablet; Take 1 tablet (80 mg total) by mouth daily.  Dispense: 90 tablet; Refill: 1  6. Type 2 diabetes mellitus with complication, with long-term current use of insulin  (HCC) Continue to watch carbs in diet - Bayer DCA Hb A1c Waived - Microalbumin / creatinine urine ratio - Vitamin B12 - metFORMIN  (GLUCOPHAGE ) 500 MG tablet; Take 2 tablets (1,000 mg total) by mouth 2 (two) times daily.  Dispense: 360 tablet; Refill: 1 - NOVOLOG  FLEXPEN 100 UNIT/ML FlexPen; INJECT 48-78 UNITS SUBCUTANEOUSLY TWICE DAILY PER  SLIDING  SCALE  Dispense: 45 mL; Refill: 5 - Semaglutide , 2 MG/DOSE, (OZEMPIC , 2 MG/DOSE,) 8 MG/3ML SOPN; Inject 2 mg into the skin once a week.  Dispense: 9 mL; Refill: 5  7. Gastroesophageal reflux disease without esophagitis Avoid spicy foods Do not eat 2 hours prior to bedtime - pantoprazole  (PROTONIX ) 40 MG tablet; Take 1 tablet (  40 mg total) by mouth daily as needed (for acid reflux).  Dispense: 90 tablet; Refill: 1  8. Benign prostatic hyperplasia with urinary hesitancy Defer to urology - tamsulosin  (FLOMAX ) 0.4 MG CAPS capsule; Take 1 capsule (0.4 mg total) by mouth at bedtime.  Dispense: 90 capsule; Refill: 1  9. BMI 36.0-36.9,adult Discussed diet and exercise for person with BMI >25 Will recheck weight in 3-6 months    Labs pending Health Maintenance  reviewed Diet and exercise encouraged  Follow up plan: 6 months   Mary-Margaret Gladis, FNP

## 2024-01-25 NOTE — Addendum Note (Signed)
 Addended by: GLADIS MUSTARD on: 01/25/2024 09:14 AM   Modules accepted: Level of Service

## 2024-01-26 LAB — CBC WITH DIFFERENTIAL/PLATELET
Basophils Absolute: 0 x10E3/uL (ref 0.0–0.2)
Basos: 0 %
EOS (ABSOLUTE): 0.2 x10E3/uL (ref 0.0–0.4)
Eos: 4 %
Hematocrit: 40.5 % (ref 37.5–51.0)
Hemoglobin: 12.7 g/dL — ABNORMAL LOW (ref 13.0–17.7)
Immature Grans (Abs): 0 x10E3/uL (ref 0.0–0.1)
Immature Granulocytes: 0 %
Lymphocytes Absolute: 1.6 x10E3/uL (ref 0.7–3.1)
Lymphs: 29 %
MCH: 27 pg (ref 26.6–33.0)
MCHC: 31.4 g/dL — ABNORMAL LOW (ref 31.5–35.7)
MCV: 86 fL (ref 79–97)
Monocytes Absolute: 0.6 x10E3/uL (ref 0.1–0.9)
Monocytes: 11 %
Neutrophils Absolute: 3 x10E3/uL (ref 1.4–7.0)
Neutrophils: 56 %
Platelets: 264 x10E3/uL (ref 150–450)
RBC: 4.71 x10E6/uL (ref 4.14–5.80)
RDW: 14.6 % (ref 11.6–15.4)
WBC: 5.4 x10E3/uL (ref 3.4–10.8)

## 2024-01-26 LAB — MICROALBUMIN / CREATININE URINE RATIO
Creatinine, Urine: 54.9 mg/dL
Microalb/Creat Ratio: 50 mg/g{creat} — ABNORMAL HIGH (ref 0–29)
Microalbumin, Urine: 27.5 ug/mL

## 2024-01-26 LAB — LIPID PANEL
Chol/HDL Ratio: 4.5 ratio (ref 0.0–5.0)
Cholesterol, Total: 94 mg/dL — ABNORMAL LOW (ref 100–199)
HDL: 21 mg/dL — ABNORMAL LOW (ref >39)
LDL Chol Calc (NIH): 52 mg/dL (ref 0–99)
Triglycerides: 114 mg/dL (ref 0–149)
VLDL Cholesterol Cal: 21 mg/dL (ref 5–40)

## 2024-01-26 LAB — CMP14+EGFR
ALT: 31 IU/L (ref 0–44)
AST: 23 IU/L (ref 0–40)
Albumin: 4.3 g/dL (ref 3.9–4.9)
Alkaline Phosphatase: 63 IU/L (ref 47–123)
BUN/Creatinine Ratio: 23 (ref 10–24)
BUN: 22 mg/dL (ref 8–27)
Bilirubin Total: 0.4 mg/dL (ref 0.0–1.2)
CO2: 23 mmol/L (ref 20–29)
Calcium: 9.4 mg/dL (ref 8.6–10.2)
Chloride: 101 mmol/L (ref 96–106)
Creatinine, Ser: 0.94 mg/dL (ref 0.76–1.27)
Globulin, Total: 2.5 g/dL (ref 1.5–4.5)
Glucose: 126 mg/dL — ABNORMAL HIGH (ref 70–99)
Potassium: 4.1 mmol/L (ref 3.5–5.2)
Sodium: 140 mmol/L (ref 134–144)
Total Protein: 6.8 g/dL (ref 6.0–8.5)
eGFR: 87 mL/min/1.73 (ref >59)

## 2024-01-26 LAB — VITAMIN B12: Vitamin B-12: 271 pg/mL (ref 232–1245)

## 2024-01-27 ENCOUNTER — Ambulatory Visit: Payer: Self-pay | Admitting: Nurse Practitioner

## 2024-03-20 ENCOUNTER — Other Ambulatory Visit: Payer: Self-pay

## 2024-03-20 ENCOUNTER — Emergency Department (HOSPITAL_COMMUNITY): Admission: EM | Admit: 2024-03-20 | Discharge: 2024-03-20 | Disposition: A | Discharge status: Home / Self Care

## 2024-03-20 ENCOUNTER — Encounter (HOSPITAL_COMMUNITY): Payer: Self-pay

## 2024-03-20 ENCOUNTER — Emergency Department (HOSPITAL_COMMUNITY)

## 2024-03-20 DIAGNOSIS — I2510 Atherosclerotic heart disease of native coronary artery without angina pectoris: Secondary | ICD-10-CM | POA: Insufficient documentation

## 2024-03-20 DIAGNOSIS — Z7984 Long term (current) use of oral hypoglycemic drugs: Secondary | ICD-10-CM | POA: Diagnosis not present

## 2024-03-20 DIAGNOSIS — N132 Hydronephrosis with renal and ureteral calculous obstruction: Secondary | ICD-10-CM | POA: Diagnosis not present

## 2024-03-20 DIAGNOSIS — E119 Type 2 diabetes mellitus without complications: Secondary | ICD-10-CM | POA: Insufficient documentation

## 2024-03-20 DIAGNOSIS — Z794 Long term (current) use of insulin: Secondary | ICD-10-CM | POA: Diagnosis not present

## 2024-03-20 DIAGNOSIS — Z79899 Other long term (current) drug therapy: Secondary | ICD-10-CM | POA: Insufficient documentation

## 2024-03-20 DIAGNOSIS — Z951 Presence of aortocoronary bypass graft: Secondary | ICD-10-CM | POA: Insufficient documentation

## 2024-03-20 DIAGNOSIS — N200 Calculus of kidney: Secondary | ICD-10-CM

## 2024-03-20 DIAGNOSIS — Z7982 Long term (current) use of aspirin: Secondary | ICD-10-CM | POA: Diagnosis not present

## 2024-03-20 DIAGNOSIS — I10 Essential (primary) hypertension: Secondary | ICD-10-CM | POA: Insufficient documentation

## 2024-03-20 DIAGNOSIS — R1031 Right lower quadrant pain: Secondary | ICD-10-CM | POA: Diagnosis present

## 2024-03-20 LAB — URINALYSIS, ROUTINE W REFLEX MICROSCOPIC
Bilirubin Urine: NEGATIVE
Glucose, UA: NEGATIVE mg/dL
Hgb urine dipstick: NEGATIVE
Ketones, ur: NEGATIVE mg/dL
Leukocytes,Ua: NEGATIVE
Nitrite: NEGATIVE
Protein, ur: 100 mg/dL — AB
Specific Gravity, Urine: 1.03 (ref 1.005–1.030)
pH: 5 (ref 5.0–8.0)

## 2024-03-20 LAB — COMPREHENSIVE METABOLIC PANEL WITH GFR
ALT: 31 U/L (ref 0–44)
AST: 27 U/L (ref 15–41)
Albumin: 4.2 g/dL (ref 3.5–5.0)
Alkaline Phosphatase: 61 U/L (ref 38–126)
Anion gap: 14 (ref 5–15)
BUN: 22 mg/dL (ref 8–23)
CO2: 25 mmol/L (ref 22–32)
Calcium: 9.1 mg/dL (ref 8.9–10.3)
Chloride: 100 mmol/L (ref 98–111)
Creatinine, Ser: 0.79 mg/dL (ref 0.61–1.24)
GFR, Estimated: >60 mL/min
Glucose, Bld: 133 mg/dL — ABNORMAL HIGH (ref 70–99)
Potassium: 4.4 mmol/L (ref 3.5–5.1)
Sodium: 139 mmol/L (ref 135–145)
Total Bilirubin: 0.7 mg/dL (ref 0.0–1.2)
Total Protein: 7.1 g/dL (ref 6.5–8.1)

## 2024-03-20 LAB — CBC
HCT: 39.7 % (ref 39.0–52.0)
Hemoglobin: 12.5 g/dL — ABNORMAL LOW (ref 13.0–17.0)
MCH: 27.4 pg (ref 26.0–34.0)
MCHC: 31.5 g/dL (ref 30.0–36.0)
MCV: 87.1 fL (ref 80.0–100.0)
Platelets: 256 K/uL (ref 150–400)
RBC: 4.56 MIL/uL (ref 4.22–5.81)
RDW: 16.2 % — ABNORMAL HIGH (ref 11.5–15.5)
WBC: 7.9 K/uL (ref 4.0–10.5)
nRBC: 0 % (ref 0.0–0.2)

## 2024-03-20 LAB — LIPASE, BLOOD: Lipase: 29 U/L (ref 11–51)

## 2024-03-20 MED ORDER — TAMSULOSIN HCL 0.4 MG PO CAPS: 0.4000 mg | ORAL_CAPSULE | Freq: Two times a day (BID) | ORAL | 0 refills | Status: AC

## 2024-03-20 MED ORDER — ONDANSETRON HCL 4 MG PO TABS: 4.0000 mg | ORAL_TABLET | Freq: Three times a day (TID) | ORAL | 0 refills | Status: AC | PRN

## 2024-03-20 MED ORDER — IOHEXOL 350 MG/ML SOLN
100.0000 mL | Freq: Once | INTRAVENOUS | Status: AC | PRN
  Administered 2024-03-20: 100 mL via INTRAVENOUS

## 2024-03-20 MED ORDER — OXYCODONE HCL 5 MG PO TABS: 2.5000 mg | ORAL_TABLET | ORAL | 0 refills | Status: DC | PRN

## 2024-03-20 NOTE — ED Triage Notes (Signed)
 Pt arrived via POV c/o right flank pain that began yesterday. Pt denies dysuria, denies hematuria but does endorse mild nausea.

## 2024-03-20 NOTE — ED Notes (Signed)
 Pt verbalized understanding of discharge instructions. Opportunity for questions provided.

## 2024-03-20 NOTE — Discharge Instructions (Signed)
 ### Ureteral Stone Discharge Instructions     **Your Diagnosis**      You have been diagnosed with a ureteral stone (kidney stone in the tube connecting your kidney to your bladder). Most stones pass on their own within a few weeks, though the exact time varies based on stone size and location.[1]      **Your Medications**      You have been prescribed the following medications:      - **Oxycodone **: Take as directed for pain. This is a narcotic pain medication that can cause drowsiness, constipation, and nausea. Do not drive or operate machinery while taking this medication. Use only when needed for severe pain.[2]      - **Tamsulosin **: Take twice daily as prescribed. This medication relaxes the muscles in your ureter to help the stone pass more easily. It may cause dizziness, especially when standing up quickly.[3][4]      - **Ondansetron **: Take as directed for nausea and vomiting.      **Pain Management**      - Over-the-counter anti-inflammatory medications like ibuprofen  or naproxen are very effective for kidney stone pain and should be used as your first choice for pain relief.[1]      - Use oxycodone  only for severe pain that does not respond to anti-inflammatory medications.      - Most patients stop needing pain medication within a few weeks, and many stop shortly after the stone passes.[2]      **What to Expect**      - You may experience episodes of pain as the stone moves through your ureter.      - You may see blood in your urine--this is common and usually not dangerous.      - Strain your urine through a coffee filter or strainer to catch the stone. Bring it to your follow-up appointment if you find it.      - The stone may take days to weeks to pass.[1]      **Activity and Diet**      - Drink plenty of fluids (8-10 glasses of water  per day) to help flush the stone.      - You may resume normal activities as tolerated.      - Avoid dehydration.       **Return to the Emergency Department Immediately If You Experience:**      - **Fever over 100.77F (38C) or chills** - this may indicate infection, which requires urgent treatment[1]      - **Inability to urinate or significantly decreased urine output** - this may indicate blockage[1]      - **Uncontrollable pain or vomiting** despite taking your medications[1]      - **Pain on both sides** - this may indicate stones in both ureters      - **Severe weakness or feeling faint**      **Follow-Up Care**      - Schedule a follow-up appointment with a urologist within 1-2 weeks.      - If your stone does not pass within 4 weeks, you may need additional treatment such as a procedure to remove it.[1]      - Bring your stone to your appointment if you are able to catch it.      - A metabolic evaluation may be recommended to help prevent future stones.      **Important Notes**      - Do not take tamsulosin  if you experience severe dizziness or fainting.      -  Keep all medications out of reach of children.      - Do not share your oxycodone  with others--it is prescribed specifically for you.      ### References  1. The Evaluation and Management of Urolithiasis in the ED: A Review of the Literature. Lora HERO, Long B, Koyfman A. The American Journal of Emergency Medicine. 2018;36(4):699-706. doi:10.1016/j.ajem.2018.01.003. 2. Analgesic and Opioid Use for Patients Discharged From the Emergency Department With Ureteral Stones. Meltzer AC, Wolfson AB, Mufarrij P, et al. Journal of Endourology. 2021;35(7):1067-1071. doi:10.1089/end.2020.0835. 3. 2007 Guideline for the Management of Ureteral Calculi. Preminger GM, Tiselius HG, Assimos DG, et al. The Journal of Urology. 2007;178(6):2418-34. doi:10.1016/j.juro.2007.09.107. 4. Tamsulosin  as a Medical Expulsive Therapy for Ureteral Stones: A Systematic Review and Meta-Analysis of Randomized Controlled Trials. Cui Y, Chen J, Zeng F, et al. The  Journal of Urology. 2019;201(5):950-955. doi:10.1097/JU.0000000000000029.

## 2024-03-20 NOTE — ED Notes (Signed)
 ED Provider at bedside.

## 2024-03-20 NOTE — ED Provider Notes (Signed)
 " [place] EMERGENCY DEPARTMENT AT [facility] Provider Note   CSN: [id number] Arrival date & time: 03/20/24  9163     Patient presents with: Abdominal Pain   [name] [name] is a 68 y.o. male who presents emergency department with a chief complaint of right sided abdominal pain.  Symptoms began yesterday.  Patient rates pain as 2 out of 10.  He states the pain starts on the right side and radiates to his right flank.  Is worse with movement and bending better with rest he denies any urinary symptoms nausea vomiting he denies any injuries or strains.  He is status post cholecystectomy.  Patient reports that he wanted make sure he did not have acute appendicitis.  He denies nausea, vomiting, diarrhea constipation inability to pass gas.  He has a history of vasculopathy, CAD status post CABG and stents, type 2 diabetes, hyperlipidemia, hypertension    Abdominal Pain      Prior to Admission medications  Medication Sig Start Date End Date Taking? Authorizing Provider  ondansetron  (ZOFRAN ) 4 MG tablet Take 1 tablet (4 mg total) by mouth every 8 (eight) hours as needed for nausea or vomiting. 03/20/24  Yes Rhiana Morash, PA-C  oxyCODONE  (ROXICODONE ) 5 MG immediate release tablet Take 0.5-1 tablets (2.5-5 mg total) by mouth every 4 (four) hours as needed for severe pain (pain score 7-10). 03/20/24  Yes Royer Cristobal, PA-C  tamsulosin  (FLOMAX ) 0.4 MG CAPS capsule Take 1 capsule (0.4 mg total) by mouth 2 (two) times daily. 03/20/24  Yes Treyven Lafauci, PA-C  Accu-Chek Softclix Lancets lancets Test BS 4-6X as needed daioly Dx E11.8 12/10/23   Gladis Mustard, FNP  aspirin  EC 81 MG tablet Take 1 tablet (81 mg total) by mouth daily. Swallow whole. 03/19/21   Debera Jayson MATSU, MD  atorvastatin  (LIPITOR ) 80 MG tablet Take 1 tablet (80 mg total) by mouth daily. 01/25/24   Gladis Mustard, FNP  BD PEN NEEDLE SHORT ULTRAFINE 31G X 8 MM MISC USE 1 PEN NEEDLE UP TO 8 TIMES DAILY  AS NEEDED WITH INSULIN  Dx E11.8 10/25/23   Gladis Mustard, FNP  blood glucose meter kit and supplies Dispense based on patient and insurance preference. Use up to four times daily as directed. (FOR ICD-10 E10.9, E11.9). 04/26/19   Gladis Mary-Margaret, FNP  Blood Glucose Monitoring Suppl DEVI 1 each by Does not apply route in the morning, at noon, and at bedtime. May substitute to any manufacturer covered by patient's insurance. 11/16/23   Gladis, Mary-Margaret, FNP  carvedilol  (COREG ) 6.25 MG tablet Take 1 tablet (6.25 mg total) by mouth 2 (two) times daily. 01/25/24   Gladis, Mary-Margaret, FNP  CINNAMON  PO Take 1,000 mg by mouth 2 (two) times daily.    [provider]  Continuous Glucose Sensor (DEXCOM G7 SENSOR) MISC Change sensor every 10 days 11/15/23   Gladis, Mary-Margaret, FNP  CRANBERRY PO Take 4,200 mg by mouth 2 (two) times daily.    [provider]  furosemide  (LASIX ) 40 MG tablet Take 1 tablet (40 mg total) by mouth daily. 01/25/24   Gladis Mary-Margaret, FNP  glucose blood (ACCU-CHEK GUIDE TEST) test strip Test BS 4-6x daily as needed Dx E11.8 12/10/23   Gladis Mustard, FNP  insulin  glargine (LANTUS  SOLOSTAR) 100 UNIT/ML Solostar Pen 47u qam and 78u q evening 10/22/23   Gladis Mustard, FNP  isosorbide  mononitrate (IMDUR ) 30 MG 24 hr tablet Take 1 tablet (30 mg total) by mouth in the morning and at bedtime. 01/25/24  Gladis, Mary-Margaret, FNP  lisinopril  (ZESTRIL ) 5 MG tablet Take 1 tablet (5 mg total) by mouth daily. 01/25/24   Gladis, Mary-Margaret, FNP  loratadine  (CLARITIN ) 10 MG tablet Take 10 mg by mouth daily. 02/15/15   [provider]  metFORMIN  (GLUCOPHAGE ) 500 MG tablet Take 2 tablets (1,000 mg total) by mouth 2 (two) times daily. 01/25/24   Gladis, Mary-Margaret, FNP  nitroGLYCERIN  (NITROSTAT ) 0.4 MG SL tablet Place 1 tablet (0.4 mg total) under the tongue every 5 (five) minutes x 3 doses as needed for chest pain (if no relief  after 2nd dose, proceed to ED or call 911). 06/29/23   Debera Jayson MATSU, MD  NOVOLOG  FLEXPEN 100 UNIT/ML FlexPen INJECT 48-78 UNITS SUBCUTANEOUSLY TWICE DAILY PER  SLIDING  SCALE 01/25/24   Gladis, Mary-Margaret, FNP  pantoprazole  (PROTONIX ) 40 MG tablet Take 1 tablet (40 mg total) by mouth daily as needed (for acid reflux). 01/25/24   Gladis Mary-Margaret, FNP  prasugrel  (EFFIENT ) 10 MG TABS tablet Take 1 tablet (10 mg total) by mouth daily. 07/14/23   Anner Alm ORN, MD  Semaglutide , 2 MG/DOSE, (OZEMPIC , 2 MG/DOSE,) 8 MG/3ML SOPN Inject 2 mg into the skin once a week. 01/25/24   Gladis Mary-Margaret, FNP  tamsulosin  (FLOMAX ) 0.4 MG CAPS capsule Take 1 capsule (0.4 mg total) by mouth at bedtime. 01/25/24   Gladis Mustard, FNP    Allergies: Patient has no known allergies.    Review of Systems  Gastrointestinal:  Positive for abdominal pain.    Updated Vital Signs BP 138/72 (BP Location: Right Arm)   Pulse 91   Temp 98.2 F (36.8 C) (Oral)   Resp 18   Ht 5' 8 (1.727 m)   Wt 116.6 kg   SpO2 93%   BMI 39.09 kg/m   Physical Exam Vitals and nursing note reviewed.  Constitutional:      General: He is not in acute distress.    Appearance: He is well-developed. He is not diaphoretic.  HENT:     Head: Normocephalic and atraumatic.  Eyes:     General: No scleral icterus.    Conjunctiva/sclera: Conjunctivae normal.  Cardiovascular:     Rate and Rhythm: Normal rate and regular rhythm.     Heart sounds: Normal heart sounds.  Pulmonary:     Effort: Pulmonary effort is normal. No respiratory distress.     Breath sounds: Normal breath sounds.  Abdominal:     Palpations: Abdomen is soft.     Tenderness: There is abdominal tenderness in the right upper quadrant and right lower quadrant.  Musculoskeletal:     Cervical back: Normal range of motion and neck supple.  Skin:    General: Skin is warm and dry.  Neurological:     Mental Status: He is alert.  Psychiatric:         Behavior: Behavior normal.     (all labs ordered are listed, but only abnormal results are displayed) Labs Reviewed  COMPREHENSIVE METABOLIC PANEL WITH GFR - Abnormal; Notable for the following components:      Result Value   Glucose, Bld 133 (*)    All other components within normal limits  CBC - Abnormal; Notable for the following components:   Hemoglobin 12.5 (*)    RDW 16.2 (*)    All other components within normal limits  URINALYSIS, ROUTINE W REFLEX MICROSCOPIC - Abnormal; Notable for the following components:   Protein, ur 100 (*)    Bacteria, UA RARE (*)    All  other components within normal limits  LIPASE, BLOOD    EKG: None  Radiology: CT Angio Chest/Abd/Pel for Dissection W and/or Wo Contrast Result Date: 03/20/2024 CLINICAL DATA:  Right flank pain and nausea. EXAM: CT ANGIOGRAPHY CHEST, ABDOMEN AND PELVIS TECHNIQUE: Non-contrast CT of the chest was initially obtained. Multidetector CT imaging through the chest, abdomen and pelvis was performed using the standard protocol during bolus administration of intravenous contrast. Multiplanar reconstructed images and MIPs were obtained and reviewed to evaluate the vascular anatomy. RADIATION DOSE REDUCTION: This exam was performed according to the departmental dose-optimization program which includes automated exposure control, adjustment of the mA and/or kV according to patient size and/or use of iterative reconstruction technique. CONTRAST:  OMNIPAQUE  IOHEXOL  350 MG/ML SOLN COMPARISON:  June 08, 2014 FINDINGS: CTA CHEST FINDINGS Cardiovascular: There is marked severity calcification of the aortic arch and descending thoracic aorta, without evidence of aortic aneurysm or dissection. Satisfactory opacification of the pulmonary arteries to the segmental level. No evidence of pulmonary embolism. Normal heart size with marked severity coronary artery calcification. No pericardial effusion. Mediastinum/Nodes: No enlarged  mediastinal, hilar, or axillary lymph nodes. Thyroid gland, trachea, and esophagus demonstrate no significant findings. Lungs/Pleura: Lungs are clear. No pleural effusion or pneumothorax. Musculoskeletal: Multiple sternal wires are present. Multilevel degenerative changes are seen throughout the thoracic spine. Review of the MIP images confirms the above findings. CTA ABDOMEN AND PELVIS FINDINGS VASCULAR Aorta: Marked severity atherosclerosis of a normal caliber aorta without aneurysm, dissection, vasculitis or significant stenosis. Celiac: Patent with marked severity atherosclerosis and without evidence of aneurysm, dissection, vasculitis or significant stenosis. SMA: Patent with marked severity atherosclerosis and without evidence of aneurysm, dissection, vasculitis or significant stenosis. Renals: Both renal arteries are patent without evidence of aneurysm, dissection, vasculitis, fibromuscular dysplasia or significant stenosis. IMA: Patent without evidence of aneurysm, dissection, vasculitis or significant stenosis. Inflow: Patent without evidence of aneurysm, dissection, vasculitis or significant stenosis. Veins: No obvious venous abnormality within the limitations of this arterial phase study. Review of the MIP images confirms the above findings. NON-VASCULAR Hepatobiliary: There is diffuse fatty infiltration of the liver parenchyma. No focal liver abnormality is seen. Status post cholecystectomy. No biliary dilatation. Pancreas: Unremarkable. No pancreatic ductal dilatation or surrounding inflammatory changes. Spleen: Normal in size without focal abnormality. Adrenals/Urinary Tract: Adrenal glands are unremarkable. Kidneys are normal in size. A 3.1 cm diameter simple cyst is seen within the mid right kidney. A 2 mm obstructing renal calculus is seen within the distal right ureter, approximately 3.0 cm proximal to the right UVJ (axial CT image 271, CT series 8/sagittal reformatted image 105, CT series 18).  Mild sections of hydroureter are seen involving the mid and distal right ureter. There is mild, bilateral nonspecific perinephric inflammatory fat stranding. There is mild diffuse urinary bladder wall thickening with very mild surrounding inflammatory fat stranding. Stomach/Bowel: Stomach is within normal limits. Appendix appears normal. No evidence of bowel wall thickening, distention, or inflammatory changes. Lymphatic: No abnormal abdominal or pelvic lymph nodes are identified. Reproductive: Mild to moderate severity prostate gland enlargement is noted. Other: There is a 3.2 cm x 4.1 cm x 4.2 cm fat containing umbilical hernia. A 2.6 cm x 4.5 cm fat containing left inguinal hernia is seen. An additional 2.1 cm x 4.2 cm fat containing right inguinal hernia is noted. No abdominopelvic ascites. Musculoskeletal: Multilevel degenerative changes are seen throughout the lumbar spine. Review of the MIP images confirms the above findings. IMPRESSION: 1. 2 mm obstructing renal calculus within  the distal right ureter. 2. Findings which may represent sequelae associated with mild cystitis. Correlation with urinalysis is recommended. 3. Hepatic steatosis. 4. Evidence of prior cholecystectomy. 5. Fat containing umbilical and bilateral inguinal hernias. 6. Aortic atherosclerosis with diffuse atherosclerotic disease involving the arterial structures of the abdomen and pelvis. Electronically Signed   By: Suzen Dials M.D.   On: 03/20/2024 13:09     Procedures   Medications Ordered in the ED  iohexol  (OMNIPAQUE ) 350 MG/ML injection 100 mL (100 mLs Intravenous Contrast Given 03/20/24 1147)    Clinical Course as of 03/20/24 1721  Mon Mar 20, 2024  1340 CT Angio Chest/Abd/Pel for Dissection W and/or Wo Contrast 2 mm obstructing stone  [AH]    Clinical Course User Index [AH] Arloa Chroman, PA-C                                 Medical Decision Making Amount and/or Complexity of Data Reviewed Labs:  ordered. Radiology: ordered. Decision-making details documented in ED Course.  Risk Prescription drug management.   This patient presents to the ED for concern of flank pan , this involves an extensive number of treatment options, and is a complaint that carries with it a high risk of complications and morbidity.  The differential diagnosis of emergent flank pain includes, but is not limited to :Abdominal aortic aneurysm,, Renal artery embolism,Renal vein thrombosis, Aortic dissection, Mesenteric ischemia, Pyelonephritis, Renal infarction, Renal hemorrhage, Nephrolithiasis/ Renal Colic, Bladder tumor,Cystitis, Biliary colic, Pancreatitis Perforated peptic ulcer Appendicitis ,Inguinal Hernia, Diverticulitis, Bowel obstruction Shingles Lower lobe pneumonia, Retroperitoneal hematoma/abscess/tumor, Epidural abscess, Epidural hematoma   Co morbidities:   has a past medical history of Anxiety, Coronary atherosclerosis of native coronary artery, Essential hypertension, GERD (gastroesophageal reflux disease), History of kidney stones, Mixed hyperlipidemia, Myocardial infarction (HCC), and Type 2 diabetes mellitus (HCC).   Social Determinants of Health:   SDOH Screenings   Food Insecurity: No Food Insecurity (09/08/2023)  Housing: Low Risk (09/08/2023)  Transportation Needs: No Transportation Needs (09/08/2023)  Utilities: Not At Risk (08/03/2023)  Alcohol Screen: Low Risk (08/03/2023)  Depression (PHQ2-9): Low Risk (01/25/2024)  Financial Resource Strain: Low Risk (09/08/2023)  Physical Activity: Insufficiently Active (09/08/2023)  Social Connections: Socially Integrated (09/08/2023)  Stress: No Stress Concern Present (09/08/2023)  Tobacco Use: Low Risk (03/20/2024)  Health Literacy: Adequate Health Literacy (08/03/2023)     Additional history:  {Additional history obtained from emr   Lab Tests:  I Ordered, and personally interpreted labs.  The pertinent results include:   Labs reviewed.   Urine is negative.  CMP with mildly elevated blood glucose, CBC with mildly elevated hemoglobin likely due to acute phase reaction.  Imaging Studies:  I ordered imaging studies including CT angiogram chest abdomen pelvis I independently visualized and interpreted imaging which showed 2 mm obstructing stone on right kidney I agree with the radiologist interpretation    Test Considered:    Critical Interventions:    Consultations Obtained:   Problem List / ED Course:     ICD-10-CM   1. Kidney stone on right side  N20.0       MDM: Patient here with left abdominal flank pain.  It appears that he has a kidney stone in the right kidney, pain is well-controlled at this time no vomiting.  PDMP reviewed.  Discharged with medications for treatment of pain.  Encourage fluid resuscitation, he has an established relationship with urology and may follow-up  in the outpatient setting.   Dispostion:  After consideration of the diagnostic results and the patients response to treatment, I feel that the patent would benefit from discharge with strict return precautions  PDMP reviewed during this encounter. .      Final diagnoses:  Kidney stone on right side    ED Discharge Orders          Ordered    oxyCODONE  (ROXICODONE ) 5 MG immediate release tablet  Every 4 hours PRN       Note to Pharmacy: N13.2 (Hydronephrosis with calculous obstruction)   03/20/24 1418    tamsulosin  (FLOMAX ) 0.4 MG CAPS capsule  2 times daily        03/20/24 1418    ondansetron  (ZOFRAN ) 4 MG tablet  Every 8 hours PRN        03/20/24 1418               Arloa Chroman, [place] 03/20/24 1721  "

## 2024-03-27 ENCOUNTER — Ambulatory Visit: Admitting: Nurse Practitioner

## 2024-03-27 ENCOUNTER — Encounter: Payer: Self-pay | Admitting: Nurse Practitioner

## 2024-03-27 VITALS — BP 137/75 | HR 93 | Temp 97.4°F | Ht 68.0 in | Wt 258.0 lb

## 2024-03-27 DIAGNOSIS — J0101 Acute recurrent maxillary sinusitis: Secondary | ICD-10-CM

## 2024-03-27 MED ORDER — AMOXICILLIN-POT CLAVULANATE 875-125 MG PO TABS: 1.0000 | ORAL_TABLET | Freq: Two times a day (BID) | ORAL | 0 refills | Status: DC

## 2024-03-27 MED ORDER — PROMETHAZINE-DM 6.25-15 MG/5ML PO SYRP: 5.0000 mL | ORAL_SOLUTION | Freq: Four times a day (QID) | ORAL | 0 refills | Status: DC | PRN

## 2024-03-27 NOTE — Progress Notes (Signed)
 "  Subjective:    Patient ID: [name] [name], male    DOB: July 26, 1955, 68 y.o.   MRN: [id number]   Chief Complaint: Sinus Problem   Sinus Problem This is a new problem. The current episode started in the past 7 days. The problem has been waxing and waning since onset. There has been no fever. Associated symptoms include congestion, coughing, headaches, sinus pressure and sneezing. Pertinent negatives include no chills. Treatments tried: mucinex , dayquil and nitequil. The treatment provided mild relief.    Patient Active Problem List   Diagnosis Date Noted   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   Hx of non-ST elevation myocardial infarction (NSTEMI) 08/19/2020   Accelerating angina (HCC)    Type 2 diabetes mellitus with complication, with long-term current use of insulin  (HCC)    Coronary artery disease involving native coronary artery of native heart with unstable angina pectoris (HCC) 02/07/2020   Bleeds easily 07/20/2019   BMI 36.0-36.9,adult 07/19/2019   Benign prostatic hyperplasia with urinary hesitancy 07/19/2019   Gastroesophageal reflux disease without esophagitis 07/19/2019   Old complex tear of medial meniscus of left knee    Old peripheral tear of lateral meniscus of left knee    Acquired trigger finger 02/15/2015   Essential hypertension, benign 10/23/2010   Hyperlipidemia associated with type 2 diabetes mellitus (HCC) 10/23/2010        Review of Systems  Constitutional:  Negative for chills and fever.  HENT:  Positive for congestion, sinus pressure and sneezing.   Respiratory:  Positive for cough.   Neurological:  Positive for headaches.       Objective:   Physical Exam Constitutional:      Appearance: Normal appearance. He is obese.  HENT:     Right Ear: Tympanic membrane normal.     Left Ear: Tympanic membrane normal.     Nose: Congestion and rhinorrhea present.     Right Sinus: Maxillary sinus tenderness present.     Left Sinus: Maxillary sinus tenderness present.      Mouth/Throat:     Pharynx: No oropharyngeal exudate or posterior oropharyngeal erythema.  Cardiovascular:     Rate and Rhythm: Normal rate and regular rhythm.     Heart sounds: Normal heart sounds.  Pulmonary:     Breath sounds: Normal breath sounds.  Skin:    General: Skin is warm.  Neurological:     General: No focal deficit present.     Mental Status: He is alert and oriented to person, place, and time.  Psychiatric:        Mood and Affect: Mood normal.        Behavior: Behavior normal.     BP 137/75   Pulse 93   Temp (!) 97.4 F (36.3 C) (Temporal)   Ht 5' 8 (1.727 m)   Wt 258 lb (117 kg)   SpO2 92%   BMI 39.23 kg/m        Assessment & Plan:    [name] in today with chief complaint of Sinus Problem   1. Acute recurrent maxillary sinusitis (Primary) 1. Take meds as prescribed 2. Use a cool mist humidifier especially during the winter months and when heat has been humid. 3. Use saline nose sprays frequently 4. Saline irrigations of the nose can be very helpful if done frequently.  * 4X daily for 1 week*  * Use of a nettie pot can be helpful with this. Follow directions with this* 5. Drink plenty of fluids 6.  Keep thermostat turn down low 7.For any cough or congestion- promethazine  DM 8. For fever or aces or pains- take tylenol  or ibuprofen  appropriate for age and weight.  * for fevers greater than 101 orally you may alternate ibuprofen  and tylenol  every  3 hours.    - promethazine -dextromethorphan (PROMETHAZINE -DM) 6.25-15 MG/5ML syrup; Take 5 mLs by mouth 4 (four) times daily as needed for cough.  Dispense: 118 mL; Refill: 0 - amoxicillin -clavulanate (AUGMENTIN ) 875-125 MG tablet; Take 1 tablet by mouth 2 (two) times daily.  Dispense: 14 tablet; Refill: 0    The above assessment and management plan was discussed with the patient. The patient verbalized understanding of and has agreed to the management plan. Patient is aware to call the clinic if  symptoms persist or worsen. Patient is aware when to return to the clinic for a follow-up visit. Patient educated on when it is appropriate to go to the emergency department.   Mary-Margaret Gladis, FNP   "

## 2024-03-27 NOTE — Patient Instructions (Signed)

## 2024-04-10 ENCOUNTER — Other Ambulatory Visit: Payer: Self-pay | Admitting: Nurse Practitioner

## 2024-04-12 ENCOUNTER — Emergency Department (HOSPITAL_COMMUNITY)

## 2024-04-12 ENCOUNTER — Inpatient Hospital Stay (HOSPITAL_COMMUNITY)
Admission: EM | Admit: 2024-04-12 | Discharge: 2024-04-14 | DRG: 872 | Disposition: A | Discharge status: Home / Self Care | Attending: Internal Medicine | Admitting: Internal Medicine

## 2024-04-12 ENCOUNTER — Encounter (HOSPITAL_COMMUNITY): Payer: Self-pay

## 2024-04-12 ENCOUNTER — Other Ambulatory Visit: Payer: Self-pay

## 2024-04-12 DIAGNOSIS — I252 Old myocardial infarction: Secondary | ICD-10-CM

## 2024-04-12 DIAGNOSIS — Z7984 Long term (current) use of oral hypoglycemic drugs: Secondary | ICD-10-CM

## 2024-04-12 DIAGNOSIS — E669 Obesity, unspecified: Secondary | ICD-10-CM | POA: Diagnosis present

## 2024-04-12 DIAGNOSIS — A4150 Gram-negative sepsis, unspecified: Principal | ICD-10-CM | POA: Diagnosis present

## 2024-04-12 DIAGNOSIS — E118 Type 2 diabetes mellitus with unspecified complications: Secondary | ICD-10-CM | POA: Diagnosis not present

## 2024-04-12 DIAGNOSIS — Z951 Presence of aortocoronary bypass graft: Secondary | ICD-10-CM

## 2024-04-12 DIAGNOSIS — Z833 Family history of diabetes mellitus: Secondary | ICD-10-CM

## 2024-04-12 DIAGNOSIS — E782 Mixed hyperlipidemia: Secondary | ICD-10-CM | POA: Diagnosis present

## 2024-04-12 DIAGNOSIS — Z7982 Long term (current) use of aspirin: Secondary | ICD-10-CM

## 2024-04-12 DIAGNOSIS — N400 Enlarged prostate without lower urinary tract symptoms: Secondary | ICD-10-CM | POA: Diagnosis present

## 2024-04-12 DIAGNOSIS — Z95828 Presence of other vascular implants and grafts: Secondary | ICD-10-CM

## 2024-04-12 DIAGNOSIS — K4090 Unilateral inguinal hernia, without obstruction or gangrene, not specified as recurrent: Secondary | ICD-10-CM | POA: Diagnosis present

## 2024-04-12 DIAGNOSIS — K429 Umbilical hernia without obstruction or gangrene: Secondary | ICD-10-CM | POA: Diagnosis present

## 2024-04-12 DIAGNOSIS — E860 Dehydration: Secondary | ICD-10-CM | POA: Diagnosis present

## 2024-04-12 DIAGNOSIS — E872 Acidosis, unspecified: Secondary | ICD-10-CM | POA: Diagnosis present

## 2024-04-12 DIAGNOSIS — Z7985 Long-term (current) use of injectable non-insulin antidiabetic drugs: Secondary | ICD-10-CM

## 2024-04-12 DIAGNOSIS — K760 Fatty (change of) liver, not elsewhere classified: Secondary | ICD-10-CM | POA: Diagnosis present

## 2024-04-12 DIAGNOSIS — I10 Essential (primary) hypertension: Secondary | ICD-10-CM | POA: Diagnosis present

## 2024-04-12 DIAGNOSIS — Z7902 Long term (current) use of antithrombotics/antiplatelets: Secondary | ICD-10-CM

## 2024-04-12 DIAGNOSIS — I959 Hypotension, unspecified: Secondary | ICD-10-CM | POA: Diagnosis present

## 2024-04-12 DIAGNOSIS — A419 Sepsis, unspecified organism: Principal | ICD-10-CM | POA: Diagnosis present

## 2024-04-12 DIAGNOSIS — I2510 Atherosclerotic heart disease of native coronary artery without angina pectoris: Secondary | ICD-10-CM | POA: Diagnosis present

## 2024-04-12 DIAGNOSIS — N419 Inflammatory disease of prostate, unspecified: Secondary | ICD-10-CM | POA: Diagnosis present

## 2024-04-12 DIAGNOSIS — N390 Urinary tract infection, site not specified: Secondary | ICD-10-CM | POA: Diagnosis present

## 2024-04-12 DIAGNOSIS — Z1152 Encounter for screening for COVID-19: Secondary | ICD-10-CM | POA: Diagnosis not present

## 2024-04-12 DIAGNOSIS — R6520 Severe sepsis without septic shock: Secondary | ICD-10-CM

## 2024-04-12 DIAGNOSIS — Z79899 Other long term (current) drug therapy: Secondary | ICD-10-CM

## 2024-04-12 DIAGNOSIS — N179 Acute kidney failure, unspecified: Secondary | ICD-10-CM

## 2024-04-12 DIAGNOSIS — E1165 Type 2 diabetes mellitus with hyperglycemia: Secondary | ICD-10-CM | POA: Diagnosis present

## 2024-04-12 DIAGNOSIS — Z8349 Family history of other endocrine, nutritional and metabolic diseases: Secondary | ICD-10-CM

## 2024-04-12 DIAGNOSIS — Z794 Long term (current) use of insulin: Secondary | ICD-10-CM | POA: Diagnosis not present

## 2024-04-12 DIAGNOSIS — Z9049 Acquired absence of other specified parts of digestive tract: Secondary | ICD-10-CM

## 2024-04-12 DIAGNOSIS — Z6839 Body mass index (BMI) 39.0-39.9, adult: Secondary | ICD-10-CM

## 2024-04-12 DIAGNOSIS — Z955 Presence of coronary angioplasty implant and graft: Secondary | ICD-10-CM

## 2024-04-12 DIAGNOSIS — Z8249 Family history of ischemic heart disease and other diseases of the circulatory system: Secondary | ICD-10-CM

## 2024-04-12 LAB — URINALYSIS, W/ REFLEX TO CULTURE (INFECTION SUSPECTED)
Bilirubin Urine: NEGATIVE
Glucose, UA: NEGATIVE mg/dL
Ketones, ur: NEGATIVE mg/dL
Nitrite: NEGATIVE
Protein, ur: 100 mg/dL — AB
Specific Gravity, Urine: 1.018 (ref 1.005–1.030)
WBC, UA: >50 WBC/hpf (ref 0–5)
pH: 5 (ref 5.0–8.0)

## 2024-04-12 LAB — COMPREHENSIVE METABOLIC PANEL WITH GFR
ALT: 25 U/L (ref 0–44)
AST: 28 U/L (ref 15–41)
Albumin: 3.8 g/dL (ref 3.5–5.0)
Alkaline Phosphatase: 66 U/L (ref 38–126)
Anion gap: 17 — ABNORMAL HIGH (ref 5–15)
BUN: 32 mg/dL — ABNORMAL HIGH (ref 8–23)
CO2: 22 mmol/L (ref 22–32)
Calcium: 8.9 mg/dL (ref 8.9–10.3)
Chloride: 99 mmol/L (ref 98–111)
Creatinine, Ser: 1.78 mg/dL — ABNORMAL HIGH (ref 0.61–1.24)
GFR, Estimated: 41 mL/min — ABNORMAL LOW
Glucose, Bld: 137 mg/dL — ABNORMAL HIGH (ref 70–99)
Potassium: 4.6 mmol/L (ref 3.5–5.1)
Sodium: 138 mmol/L (ref 135–145)
Total Bilirubin: 1 mg/dL (ref 0.0–1.2)
Total Protein: 7.2 g/dL (ref 6.5–8.1)

## 2024-04-12 LAB — PROTIME-INR
INR: 1.2 (ref 0.8–1.2)
Prothrombin Time: 16.4 s — ABNORMAL HIGH (ref 11.4–15.2)

## 2024-04-12 LAB — CBC WITH DIFFERENTIAL/PLATELET
Abs Immature Granulocytes: 0.4 K/uL — ABNORMAL HIGH (ref 0.00–0.07)
Basophils Absolute: 0 K/uL (ref 0.0–0.1)
Basophils Relative: 0 %
Eosinophils Absolute: 0 K/uL (ref 0.0–0.5)
Eosinophils Relative: 0 %
HCT: 37.6 % — ABNORMAL LOW (ref 39.0–52.0)
Hemoglobin: 11.5 g/dL — ABNORMAL LOW (ref 13.0–17.0)
Lymphocytes Relative: 5 %
Lymphs Abs: 1 K/uL (ref 0.7–4.0)
MCH: 27.5 pg (ref 26.0–34.0)
MCHC: 30.6 g/dL (ref 30.0–36.0)
MCV: 90 fL (ref 80.0–100.0)
Metamyelocytes Relative: 2 %
Monocytes Absolute: 2.5 K/uL — ABNORMAL HIGH (ref 0.1–1.0)
Monocytes Relative: 13 %
Neutro Abs: 15.4 K/uL — ABNORMAL HIGH (ref 1.7–7.7)
Neutrophils Relative %: 80 %
Platelets: 260 K/uL (ref 150–400)
RBC: 4.18 MIL/uL — ABNORMAL LOW (ref 4.22–5.81)
RDW: 16.8 % — ABNORMAL HIGH (ref 11.5–15.5)
Smear Review: NORMAL
WBC: 19.3 K/uL — ABNORMAL HIGH (ref 4.0–10.5)
nRBC: 0 % (ref 0.0–0.2)

## 2024-04-12 LAB — RESP PANEL BY RT-PCR (RSV, FLU A&B, COVID)  RVPGX2
Influenza A by PCR: NEGATIVE
Influenza B by PCR: NEGATIVE
Resp Syncytial Virus by PCR: NEGATIVE
SARS Coronavirus 2 by RT PCR: NEGATIVE

## 2024-04-12 LAB — LACTIC ACID, PLASMA
Lactic Acid, Venous: 3.8 mmol/L (ref 0.5–1.9)
Lactic Acid, Venous: 2.2 mmol/L (ref 0.5–1.9)
Lactic Acid, Venous: 1.9 mmol/L (ref 0.5–1.9)

## 2024-04-12 LAB — GLUCOSE, CAPILLARY: Glucose-Capillary: 146 mg/dL — ABNORMAL HIGH (ref 70–99)

## 2024-04-12 LAB — CBG MONITORING, ED: Glucose-Capillary: 144 mg/dL — ABNORMAL HIGH (ref 70–99)

## 2024-04-12 MED ORDER — VANCOMYCIN HCL IN DEXTROSE 1-5 GM/200ML-% IV SOLN
1000.0000 mg | Freq: Once | INTRAVENOUS | Status: AC
  Administered 2024-04-12: 1000 mg via INTRAVENOUS
  Filled 2024-04-12: qty 200

## 2024-04-12 MED ORDER — PROCHLORPERAZINE EDISYLATE 10 MG/2ML IJ SOLN: 5.0000 mg | Freq: Four times a day (QID) | INTRAMUSCULAR | Status: DC | PRN

## 2024-04-12 MED ORDER — ACETAMINOPHEN 325 MG PO TABS
650.0000 mg | ORAL_TABLET | Freq: Four times a day (QID) | ORAL | Status: DC | PRN
  Administered 2024-04-13 (×3): 650 mg via ORAL
  Filled 2024-04-12 (×3): qty 2

## 2024-04-12 MED ORDER — LACTATED RINGERS IV BOLUS (SEPSIS)
1000.0000 mL | Freq: Once | INTRAVENOUS | Status: AC
  Administered 2024-04-12: 1000 mL via INTRAVENOUS

## 2024-04-12 MED ORDER — ACETAMINOPHEN 500 MG PO TABS
1000.0000 mg | ORAL_TABLET | Freq: Once | ORAL | Status: AC
  Administered 2024-04-12: 1000 mg via ORAL
  Filled 2024-04-12: qty 2

## 2024-04-12 MED ORDER — PRASUGREL HCL 10 MG PO TABS
10.0000 mg | ORAL_TABLET | Freq: Every day | ORAL | Status: DC
  Administered 2024-04-13 – 2024-04-14 (×2): 10 mg via ORAL
  Filled 2024-04-12 (×3): qty 1

## 2024-04-12 MED ORDER — LACTATED RINGERS IV SOLN
INTRAVENOUS | Status: AC
  Administered 2024-04-13: 150 mL/h via INTRAVENOUS

## 2024-04-12 MED ORDER — SODIUM CHLORIDE 0.9 % IV SOLN
2.0000 g | Freq: Once | INTRAVENOUS | Status: AC
  Administered 2024-04-12: 2 g via INTRAVENOUS
  Filled 2024-04-12: qty 12.5

## 2024-04-12 MED ORDER — POLYETHYLENE GLYCOL 3350 17 G PO PACK: 17.0000 g | PACK | Freq: Every day | ORAL | Status: DC | PRN

## 2024-04-12 MED ORDER — ENOXAPARIN SODIUM 40 MG/0.4ML IJ SOSY: 40.0000 mg | PREFILLED_SYRINGE | INTRAMUSCULAR | Status: DC

## 2024-04-12 MED ORDER — METRONIDAZOLE 500 MG/100ML IV SOLN
500.0000 mg | Freq: Once | INTRAVENOUS | Status: AC
  Administered 2024-04-12: 500 mg via INTRAVENOUS
  Filled 2024-04-12: qty 100

## 2024-04-12 MED ORDER — ASPIRIN 81 MG PO TBEC
81.0000 mg | DELAYED_RELEASE_TABLET | Freq: Every day | ORAL | Status: DC
  Administered 2024-04-12 – 2024-04-14 (×3): 81 mg via ORAL
  Filled 2024-04-12 (×3): qty 1

## 2024-04-12 MED ORDER — LACTATED RINGERS IV BOLUS (SEPSIS)
200.0000 mL | Freq: Once | INTRAVENOUS | Status: AC
  Administered 2024-04-12: 200 mL via INTRAVENOUS

## 2024-04-12 MED ORDER — SODIUM CHLORIDE 0.9 % IV SOLN
1.0000 g | Freq: Two times a day (BID) | INTRAVENOUS | Status: DC
  Administered 2024-04-12 – 2024-04-13 (×2): 1 g via INTRAVENOUS
  Filled 2024-04-12 (×2): qty 20

## 2024-04-12 MED ORDER — INSULIN ASPART 100 UNIT/ML IJ SOLN
0.0000 [IU] | Freq: Every day | INTRAMUSCULAR | Status: DC
  Administered 2024-04-13: 2 [IU] via SUBCUTANEOUS
  Filled 2024-04-12: qty 1

## 2024-04-12 MED ORDER — TAMSULOSIN HCL 0.4 MG PO CAPS
0.4000 mg | ORAL_CAPSULE | Freq: Two times a day (BID) | ORAL | Status: DC
  Administered 2024-04-12 – 2024-04-14 (×4): 0.4 mg via ORAL
  Filled 2024-04-12 (×4): qty 1

## 2024-04-12 MED ORDER — ENOXAPARIN SODIUM 60 MG/0.6ML IJ SOSY
55.0000 mg | PREFILLED_SYRINGE | INTRAMUSCULAR | Status: DC
  Administered 2024-04-12 – 2024-04-13 (×2): 55 mg via SUBCUTANEOUS
  Filled 2024-04-12 (×2): qty 0.6

## 2024-04-12 MED ORDER — INSULIN ASPART 100 UNIT/ML IJ SOLN
0.0000 [IU] | Freq: Three times a day (TID) | INTRAMUSCULAR | Status: DC
  Administered 2024-04-13: 1 [IU] via SUBCUTANEOUS
  Administered 2024-04-13: 3 [IU] via SUBCUTANEOUS
  Administered 2024-04-13 – 2024-04-14 (×2): 2 [IU] via SUBCUTANEOUS
  Administered 2024-04-14: 3 [IU] via SUBCUTANEOUS
  Filled 2024-04-12 (×5): qty 1

## 2024-04-12 MED ORDER — IOHEXOL 300 MG/ML  SOLN
80.0000 mL | Freq: Once | INTRAMUSCULAR | Status: AC | PRN
  Administered 2024-04-12: 80 mL via INTRAVENOUS

## 2024-04-12 MED ORDER — ATORVASTATIN CALCIUM 40 MG PO TABS
80.0000 mg | ORAL_TABLET | Freq: Every day | ORAL | Status: DC
  Administered 2024-04-13 – 2024-04-14 (×2): 80 mg via ORAL
  Filled 2024-04-12 (×2): qty 2

## 2024-04-12 MED ORDER — OXYCODONE HCL 5 MG PO TABS
5.0000 mg | ORAL_TABLET | ORAL | Status: DC | PRN
  Filled 2024-04-12: qty 2

## 2024-04-12 MED ORDER — MELATONIN 3 MG PO TABS
6.0000 mg | ORAL_TABLET | Freq: Every evening | ORAL | Status: DC | PRN
  Administered 2024-04-12 – 2024-04-13 (×2): 6 mg via ORAL
  Filled 2024-04-12 (×2): qty 2

## 2024-04-12 NOTE — H&P (Addendum)
 " History and Physical  [name] [name] FMW:[id number] DOB: 20-Jul-1955 DOA: 04/12/2024  Referring physician:  Dr. Francesca, EDP PCP: Gladis Mustard, FNP  Outpatient Specialists: Cardiology Patient coming from: Home  Chief Complaint: Dysuria, lower back pain, nausea, and vomiting.   HPI: [name] [name] is a 69 y.o. male with medical history significant for coronary artery disease status post PCI with stenting in 2005, status post CABG in 2022, hypertension, type 2 diabetes, hyperlipidemia, obesity, BPH, who presents to the ED with complaints of dysuria, lower back pain, nausea, and vomiting for the past 2 days.  Associated with poor oral intake.  Today his symptoms worsened.  The pain from the dysuria was unbearable.  Denies any postprandial abdominal pain.  Denies unintentional weight loss.  He presented to the ER for further evaluation.  In the ER, febrile with Tmax 102.5.  Hypotensive with SBP of 92, tachycardic 105, tachypneic 26.  Lab studies notable for lactic acidosis 3.8, leukocytosis 19.  UA positive for pyuria.  CT abdomen and pelvis with contrast revealed circumferential bladder wall thickening and moderate perivesicular inflammatory stranding suggestive of infectious or inflammatory cystitis.  Moderate prostatic hypertrophy with superimposed periprostatic and perivesicular inflammatory stranding suggestive of infectious cystoprostatitis.  Code sepsis was called in the ER.  Peripheral blood cultures x 2 and urine culture were collected.  The patient received IV vancomycin , IV Flagyl  and cefepime .  TR[facility], hospitalist service, was asked to admit.  Incidental finding on CT scan: Prominent atherosclerotic calcification at the origin of the mesenteric vasculature with stenosis not well assessed on this arteriographic study.  Assess for symptoms of chronic mesenteric ischemia, and if present CT arteriography will be helpful for further evaluation.  Moderate hepatic steatosis.  Small  fat-containing umbilical hernia and moderate fat-containing left inguinal hernia.   ED Course: Temperature 102.5.  BP 102/66, pulse 94, respiratory rate 21, O2 saturation 96% on room air.  Review of Systems: Review of systems as noted in the HPI. All other systems reviewed and are negative.   Past Medical History:  Diagnosis Date   Anxiety    Coronary atherosclerosis of native coronary artery    DES distal circumflex 2005; DES LAD/diagonal bifurcation 09/2010; DES ostial LAD and DES left PL 01/2020; CABG May 2022 (LIMA to LAD, SVG to diagonal, SVG to OM1 and OM 3)   Essential hypertension    GERD (gastroesophageal reflux disease)    History of kidney stones    Mixed hyperlipidemia    Myocardial infarction (HCC)    Anterolateral with VF arrest 7/12   Type 2 diabetes mellitus (HCC)    Past Surgical History:  Procedure Laterality Date   CARDIAC CATHETERIZATION  2012   CAROTID STENT     stents x 2    CHOLECYSTECTOMY N/A 12/04/2015   Procedure: LAPAROSCOPIC CHOLECYSTECTOMY;  Surgeon: Oneil Budge, MD;  Location: [facility];  Service: General;  Laterality: N/A;   CHOLECYSTECTOMY, LAPAROSCOPIC  12/05/2015   CORONARY ANGIOPLASTY  2012   STENT X 1 2012, STENT X 1 YRS BEFORE   CORONARY ARTERY BYPASS GRAFT N/A 08/21/2020   Procedure: CORONARY ARTERY BYPASS GRAFTING (CABG) X 4, USING LEFT INTERNAL MAMMARY ARTERY AND RIGHT GREATER SAPHENOUS VEIN HARVESTED ENDOSCOPICALLY. SVG TO OM1, OM3 SEQUENTIALLY, SVG TO DIAG., LIMA TO LAD;  Surgeon: Kerrin Elspeth BROCKS, MD;  Location: [facility] OR;  Service: Open Heart Surgery;  Laterality: N/A;   CORONARY ATHERECTOMY N/A 02/09/2020   Procedure: CORONARY ATHERECTOMY;  Surgeon: Darron Deatrice LABOR, MD;  Location: [facility] INVASIVE  CV LAB;  Service: Cardiovascular;  Laterality: N/A;   CORONARY IMAGING/OCT N/A 05/22/2020   Procedure: INTRAVASCULAR IMAGING/OCT;  Surgeon: Dann Candyce RAMAN, MD;  Location: [facility] INVASIVE CV LAB;  Service: Cardiovascular;  Laterality: N/A;    CORONARY LITHOTRIPSY N/A 07/15/2023   Procedure: CORONARY LITHOTRIPSY;  Surgeon: Jordan, Peter M, MD;  Location: [facility] INVASIVE CV LAB;  Service: Cardiovascular;  Laterality: N/A;   CORONARY STENT INTERVENTION N/A 02/09/2020   Procedure: CORONARY STENT INTERVENTION;  Surgeon: Darron Deatrice LABOR, MD;  Location: [facility] INVASIVE CV LAB;  Service: Cardiovascular;  Laterality: N/A;   CORONARY STENT INTERVENTION N/A 05/22/2020   Procedure: CORONARY STENT INTERVENTION;  Surgeon: Dann Candyce RAMAN, MD;  Location: [facility] INVASIVE CV LAB;  Service: Cardiovascular;  Laterality: N/A;   CORONARY STENT INTERVENTION N/A 07/15/2023   Procedure: CORONARY STENT INTERVENTION;  Surgeon: Jordan, Peter M, MD;  Location: [facility] INVASIVE CV LAB;  Service: Cardiovascular;  Laterality: N/A;   CORONARY ULTRASOUND/IVUS N/A 02/09/2020   Procedure: Intravascular Ultrasound/IVUS;  Surgeon: Darron Deatrice LABOR, MD;  Location: [facility] INVASIVE CV LAB;  Service: Cardiovascular;  Laterality: N/A;   ENDOVEIN HARVEST OF GREATER SAPHENOUS VEIN Right 08/21/2020   Procedure: ENDOVEIN HARVEST OF GREATER SAPHENOUS VEIN;  Surgeon: Kerrin Elspeth BROCKS, MD;  Location: [facility] OR;  Service: Open Heart Surgery;  Laterality: Right;   HYDROCELE EXCISION Bilateral 06/02/2017   Procedure: HYDROCELECTOMY ADULT;  Surgeon: Devere Lonni Righter, MD;  Location: [facility] ORS;  Service: Urology;  Laterality: Bilateral;  ONLY NEEDS 45 MIN   INGUINAL HERNIA REPAIR     RIGHT GROIN   KNEE ARTHROSCOPY Left 05/23/2019   Procedure: LEFT KNEE ARTHROSCOPY AND DEBRIDEMENT PARTIAL MEDIAL MENISECTOMY;  Surgeon: Harden Jerona GAILS, MD;  Location: [facility];  Service: Orthopedics;  Laterality: Left;   LEFT HEART CATH AND CORONARY ANGIOGRAPHY N/A 02/07/2020   Procedure: LEFT HEART CATH AND CORONARY ANGIOGRAPHY;  Surgeon: Anner Alm ORN, MD;  Location: [facility] INVASIVE CV LAB;  Service: Cardiovascular;  Laterality: N/A;   LEFT HEART CATH AND CORONARY ANGIOGRAPHY N/A 05/22/2020   Procedure:  LEFT HEART CATH AND CORONARY ANGIOGRAPHY;  Surgeon: Dann Candyce RAMAN, MD;  Location: [facility] INVASIVE CV LAB;  Service: Cardiovascular;  Laterality: N/A;   LEFT HEART CATH AND CORONARY ANGIOGRAPHY N/A 08/15/2020   Procedure: LEFT HEART CATH AND CORONARY ANGIOGRAPHY;  Surgeon: Court Dorn PARAS, MD;  Location: [facility] INVASIVE CV LAB;  Service: Cardiovascular;  Laterality: N/A;   RIGHT/LEFT HEART CATH AND CORONARY/GRAFT ANGIOGRAPHY N/A 07/14/2023   Procedure: RIGHT/LEFT HEART CATH AND CORONARY/GRAFT ANGIOGRAPHY;  Surgeon: Anner Alm ORN, MD;  Location: [facility] INVASIVE CV LAB;  Service: Cardiovascular;  Laterality: N/A;   TEE WITHOUT CARDIOVERSION N/A 08/21/2020   Procedure: TRANSESOPHAGEAL ECHOCARDIOGRAM (TEE);  Surgeon: Kerrin Elspeth BROCKS, MD;  Location: [facility] OR;  Service: Open Heart Surgery;  Laterality: N/A;   TRIGGER FINGER RELEASE Right 01/08/2015   Procedure: RELEASE TRIGGER FINGER/A-1 PULLEY RIGHT MIDDLE FINGER;  Surgeon: Arley Curia, MD;  Location:  SURGERY CENTER;  Service: Orthopedics;  Laterality: Right;    Social History:  reports that he has never smoked. He has never been exposed to tobacco smoke. He has never used smokeless tobacco. He reports that he does not drink alcohol and does not use drugs.   Allergies[1]  Family History  Problem Relation Age of Onset   Hyperlipidemia Mother    Hypertension Mother    Hyperlipidemia Father    Hypertension Father    Diabetes Father    Dementia Father  Diabetes Maternal Grandfather       Prior to Admission medications  Medication Sig Start Date End Date Taking? Authorizing Provider  Accu-Chek Softclix Lancets lancets Test BS 4-6X as needed daioly Dx E11.8 12/10/23   Gladis Mustard, FNP  amoxicillin -clavulanate (AUGMENTIN ) 875-125 MG tablet Take 1 tablet by mouth 2 (two) times daily. 03/27/24   Gladis Mary-Margaret, FNP  aspirin  EC 81 MG tablet Take 1 tablet (81 mg total) by mouth daily. Swallow whole. 03/19/21   Debera Jayson MATSU, MD  atorvastatin  (LIPITOR ) 80 MG tablet Take 1 tablet (80 mg total) by mouth daily. 01/25/24   Gladis Mustard, FNP  blood glucose meter kit and supplies Dispense based on patient and insurance preference. Use up to four times daily as directed. (FOR ICD-10 E10.9, E11.9). 04/26/19   Gladis Mary-Margaret, FNP  Blood Glucose Monitoring Suppl DEVI 1 each by Does not apply route in the morning, at noon, and at bedtime. May substitute to any manufacturer covered by patient's insurance. 11/16/23   Gladis, Mary-Margaret, FNP  carvedilol  (COREG ) 6.25 MG tablet Take 1 tablet (6.25 mg total) by mouth 2 (two) times daily. 01/25/24   Gladis Mary-Margaret, FNP  CINNAMON  PO Take 1,000 mg by mouth 2 (two) times daily.    [provider]  Continuous Glucose Sensor (DEXCOM G7 SENSOR) MISC Change sensor every 10 days 11/15/23   Gladis, Mary-Margaret, FNP  CRANBERRY PO Take 4,200 mg by mouth 2 (two) times daily.    [provider]  furosemide  (LASIX ) 40 MG tablet Take 1 tablet (40 mg total) by mouth daily. 01/25/24   Gladis Mustard, FNP  glucose blood (ACCU-CHEK GUIDE TEST) test strip Test BS 4-6x daily as needed Dx E11.8 12/10/23   Gladis Mustard, FNP  insulin  glargine (LANTUS  SOLOSTAR) 100 UNIT/ML Solostar Pen 47u qam and 78u q evening 10/22/23   Gladis, Mary-Margaret, FNP  Insulin  Pen Needle (EMBECTA PEN NEEDLE ULTRAFINE) 31G X 8 MM MISC USE 1 PEN NEEDLE UP TO 8 TIMES DAILY AS NEEDED WITH INSULIN  Dx E11.8 04/10/24   Gladis Mustard, FNP  isosorbide  mononitrate (IMDUR ) 30 MG 24 hr tablet Take 1 tablet (30 mg total) by mouth in the morning and at bedtime. 01/25/24   Gladis Mary-Margaret, FNP  lisinopril  (ZESTRIL ) 5 MG tablet Take 1 tablet (5 mg total) by mouth daily. 01/25/24   Gladis, Mary-Margaret, FNP  loratadine  (CLARITIN ) 10 MG tablet Take 10 mg by mouth daily. 02/15/15   [provider]  metFORMIN  (GLUCOPHAGE ) 500 MG tablet Take 2 tablets (1,000 mg  total) by mouth 2 (two) times daily. 01/25/24   Gladis, Mary-Margaret, FNP  nitroGLYCERIN  (NITROSTAT ) 0.4 MG SL tablet Place 1 tablet (0.4 mg total) under the tongue every 5 (five) minutes x 3 doses as needed for chest pain (if no relief after 2nd dose, proceed to ED or call 911). 06/29/23   Debera Jayson MATSU, MD  NOVOLOG  FLEXPEN 100 UNIT/ML FlexPen INJECT 48-78 UNITS SUBCUTANEOUSLY TWICE DAILY PER  SLIDING  SCALE 01/25/24   Gladis, Mary-Margaret, FNP  ondansetron  (ZOFRAN ) 4 MG tablet Take 1 tablet (4 mg total) by mouth every 8 (eight) hours as needed for nausea or vomiting. 03/20/24   Harris, Abigail, PA-C  oxyCODONE  (ROXICODONE ) 5 MG immediate release tablet Take 0.5-1 tablets (2.5-5 mg total) by mouth every 4 (four) hours as needed for severe pain (pain score 7-10). 03/20/24   Harris, Abigail, PA-C  pantoprazole  (PROTONIX ) 40 MG tablet Take 1 tablet (40 mg total) by mouth daily as needed (for acid  reflux). 01/25/24   Gladis, Mary-Margaret, FNP  prasugrel  (EFFIENT ) 10 MG TABS tablet Take 1 tablet (10 mg total) by mouth daily. 07/14/23   Anner Alm ORN, MD  promethazine -dextromethorphan (PROMETHAZINE -DM) 6.25-15 MG/5ML syrup Take 5 mLs by mouth 4 (four) times daily as needed for cough. 03/27/24   Gladis, Mary-Margaret, FNP  Semaglutide , 2 MG/DOSE, (OZEMPIC , 2 MG/DOSE,) 8 MG/3ML SOPN Inject 2 mg into the skin once a week. 01/25/24   Gladis, Mary-Margaret, FNP  tamsulosin  (FLOMAX ) 0.4 MG CAPS capsule Take 1 capsule (0.4 mg total) by mouth at bedtime. Patient not taking: Reported on 03/27/2024 01/25/24   Gladis Mustard, FNP  tamsulosin  (FLOMAX ) 0.4 MG CAPS capsule Take 1 capsule (0.4 mg total) by mouth 2 (two) times daily. 03/20/24   Arloa Chroman, PA-C    Physical Exam: BP 113/67   Pulse (!) 101   Temp (!) 102.5 F (39.2 C) (Rectal)   Resp (!) 24   Ht 5' 8 (1.727 m)   Wt 113.4 kg   SpO2 98%   BMI 38.01 kg/m   General: 69 y.o. year-old male well developed well nourished, weak  appearing.  Alert and oriented x3. Cardiovascular: Regular rate and rhythm with no rubs or gallops.  No thyromegaly or JVD noted.  Trace lower extremity edema bilaterally. Respiratory: Clear to auscultation with no wheezes or rales. Good inspiratory effort. Abdomen: Soft nontender nondistended with normal bowel sounds x4 quadrants. Muskuloskeletal: No cyanosis or clubbing noted bilaterally Neuro: CN II-XII intact, strength, sensation, reflexes Skin: No ulcerative lesions noted or rashes Psychiatry: Judgement and insight appear normal. Mood is appropriate for condition and setting          Labs on Admission:  Basic Metabolic Panel: Recent Labs  Lab 04/12/24 1659  NA 138  K 4.6  CL 99  CO2 22  GLUCOSE 137*  BUN 32*  CREATININE 1.78*  CALCIUM  8.9   Liver Function Tests: Recent Labs  Lab 04/12/24 1659  AST 28  ALT 25  ALKPHOS 66  BILITOT 1.0  PROT 7.2  ALBUMIN  3.8   No results for input(s): LIPASE, AMYLASE in the last 168 hours. No results for input(s): AMMONIA in the last 168 hours. CBC: Recent Labs  Lab 04/12/24 1659  WBC 19.3*  NEUTROABS 15.4*  HGB 11.5*  HCT 37.6*  MCV 90.0  PLT 260   Cardiac Enzymes: No results for input(s): CKTOTAL, CKMB, CKMBINDEX, TROPONINI in the last 168 hours.  BNP (last 3 results) Recent Labs    07/07/23 0934  BNP 88.2    ProBNP (last 3 results) No results for input(s): PROBNP in the last 8760 hours.  CBG: Recent Labs  Lab 04/12/24 1642  GLUCAP 144*    Radiological Exams on Admission: CT ABDOMEN PELVIS W CONTRAST Result Date: 04/12/2024 EXAM: CT ABDOMEN AND PELVIS WITH CONTRAST 04/12/2024 06:32:52 PM TECHNIQUE: CT of the abdomen and pelvis was performed with the administration of 80 mL of iohexol  (OMNIPAQUE ) 300 MG/ML solution. Multiplanar reformatted images are provided for review. Automated exposure control, iterative reconstruction, and/or weight-based adjustment of the mA/kV was utilized to reduce the  radiation dose to as low as reasonably achievable. COMPARISON: 03/20/2024 CLINICAL HISTORY: Abdominal pain, acute, nonlocalized. Sepsis. Fatigue, anorexia, cough. FINDINGS: LOWER CHEST: No acute abnormality. LIVER: Moderate hepatic steatosis. No intrahepatic mass. No intrahepatic biliary ductal dilation. GALLBLADDER AND BILE DUCTS: Status post cholecystectomy. No biliary ductal dilatation. SPLEEN: No acute abnormality. PANCREAS: No acute abnormality. ADRENAL GLANDS: No acute abnormality. KIDNEYS, URETERS AND BLADDER: Simple cortical cyst  within the interpolar region of the right kidney for which no follow up imaging is recommended. No stones in the kidneys or ureters. No hydronephrosis. No perinephric or periureteral stranding. The bladder is decompressed. There is, however, circumferential bladder wall thickening and moderate perivesicular inflammatory stranding suggesting a superimposed infectious or inflammatory cystitis. Correlation with urinalysis and urine culture may be helpful for further management. Moderate prostatic hypertrophy with superimposed periprosthetic inflammatory stranding which involves the seminal vesicles as well. Together, and with the findings involving the bladder, the findings may reflect changes of infectious cystoprostatitis. GI AND BOWEL: Appendix normal. The stomach, small bowel, and large bowel are otherwise unremarkable. There is no bowel obstruction. PERITONEUM AND RETROPERITONEUM: No ascites. No free air. VASCULATURE: Aorta is normal in caliber. There is extensive aortoiliac atherosclerotic calcification. Particularly prominent atherosclerotic calcification is seen at the origin of the mesenteric vasculature, though the degree of stenosis is not well assessed on this non-arteriographic study. Clinical correlation for signs and symptoms of chronic mesenteric ischemia are recommended. If present, CT arteriography would be helpful for further evaluation. LYMPH NODES: No  lymphadenopathy. REPRODUCTIVE ORGANS: No acute abnormality. BONES AND SOFT TISSUES: Small fat-containing umbilical hernia. Mild subcutaneous body wall edema. Moderate fat-containing left inguinal hernia. Osseous structures are age appropriate. No acute bone abnormality. No lytic or blastic bone lesion. IMPRESSION: 1. Circumferential bladder wall thickening and moderate perivesicular inflammatory stranding, suggestive of infectious or inflammatory cystitis; urinalysis and urine culture may be helpful for further management. 2. Moderate prostatic hypertrophy with superimposed periprostatic and perivesicular inflammatory stranding, suggestive of infectious cystoprostatitis. 3. Prominent atherosclerotic calcification at the origin of the mesenteric vasculature with stenosis not well assessed on this non-arteriographic study; assess for symptoms of chronic mesenteric ischemia, and if present CT arteriography would be helpful for further evaluation. 4. Moderate hepatic steatosis. 5. Small fat-containing umbilical hernia and moderate fat-containing left inguinal hernia. 6. RAF score includes Aortic atherosclerosis (ICD10-I70.0). Electronically signed by: Dorethia Molt MD 04/12/2024 06:47 PM EST RP Workstation: [id number]   DG Chest Port 1 View Result Date: 04/12/2024 CLINICAL DATA:  Questionable sepsis. EXAM: PORTABLE CHEST 1 VIEW COMPARISON:  Chest radiograph dated 07/14/2023. FINDINGS: Minimal left lung base atelectasis. No focal consolidation, pleural effusion, pneumothorax. Stable cardiac silhouette. Median sternotomy wires. No acute osseous pathology. IMPRESSION: No active disease. Electronically Signed   By: Vanetta Chou M.D.   On: 04/12/2024 17:29    EKG: I independently viewed the EKG done and my findings are as followed: Sinus tachycardia rate of 103.  QTc 430.  Assessment/Plan Present on Admission:  Sepsis (HCC)  Principal Problem:   Sepsis (HCC)  Sepsis secondary to cystoprostatitis, seen on  CT scan, POA. Presented with fever 102.5, leukocytosis 19K, tachypnea 26, tachycardia 105, lactic acidosis 3.8, UA positive for pyuria.  Cystoprostatitis seen on CT scan. Previous history of ESBL E. coli UTI in 2023 Received 1 dose of cefepime , IV vancomycin , and IV Flagyl  in the ER. Switched to Merrem , continue Continue IV fluid hydration. Follow-up peripheral blood cultures x 2 and urine culture for ID and sensitivities Consider infectious disease consultation in the morning The patient will need 4 to 6 weeks of antibiotics for prostatitis.  Florastor 250 mg twice daily probiotics added. Monitor fever curve and WBCs Maintain MAP greater than 65.  AKI, prerenal in the setting of dehydration from poor oral intake At baseline creatinine is 0.7 with GFR greater than 60 Presented with creatinine of 1.78 with GFR 41. Continue IV fluid hydration Monitor urine output Repeat BMP  in the morning.  Coronary artery disease status post PCI with stenting and CABG Denies any anginal symptoms. Resume home regimen, aspirin , prasugrel , Lipitor . Monitor on telemetry  BPH Resume home Flomax  Monitor urine output  Type 2 diabetes with hyperglycemia Last hemoglobin A1c 7.3 on 07/14/2023 Heart healthy carb modified diet Insulin  coverage.  Hypertension BPs are currently soft Hold off home oral antihypertensives Closely monitor vital signs Maintain MAP greater than 65 in the setting of sepsis.  Hyperlipidemia Resume home statin  Obesity BMI 38 Recommend weight loss outpatient with regular physical activity and healthy dieting.  Prominent atherosclerotic calcification at the origin of the mesenteric vasculature with stenosis, seen on CT scan. Not well assessed on this arteriographic study.   Denies any postprandial abdominal pain Denies unintentional weight loss Denies chronic nausea Continue to assess for symptoms of chronic mesenteric ischemia, and if present CT arteriography will be  helpful for further evaluation.    Moderate hepatic steatosis.  Recommend weight loss  Small fat-containing umbilical hernia and moderate fat-containing left inguinal hernia. Recommend outpatient follow-up  Generalized weakness PT OT evaluation Fall precautions.   Critical care time: 55 minutes.   DVT prophylaxis: Subcu Lovenox  daily.  Code Status: Full code.  Family Communication: Patient's wife at bedside.  Disposition Plan: Admitted to telemetry unit.  Consults called: None.  Admission status: Inpatient status.   Status is: Inpatient The patient requires at least 2 midnights for further evaluation and treatment of present condition.   Terry LOISE Hurst MD [facility] Pager [phone]  If 7PM-7AM, please contact night-coverage [email or web address] Password [facility]  04/12/2024, 7:36 PM      [1] No Known Allergies  "

## 2024-04-12 NOTE — ED Triage Notes (Signed)
 Pt bib rcems with complaints of fatigue. Pt states he has not eaten or drank much in the last couple days. Pt states that he has been coughing for about 2 weeks.

## 2024-04-12 NOTE — Sepsis Progress Note (Signed)
 Elink will follow per sepsis protocol.

## 2024-04-12 NOTE — Progress Notes (Signed)
 CODE SEPSIS - PHARMACY COMMUNICATION  **Broad Spectrum Antibiotics should be administered within 1 hour of Sepsis diagnosis**  Time Code Sepsis Called/Page Received: 1505  Antibiotics Ordered: cefepime , metronidazole , vancomycin   Time of 1st antibiotic administration: 1726  Additional action taken by pharmacy: n/a   If necessary, Name of Provider/Nurse Contacted: n/a    Bari Glendia BIRCH ,PharmD Clinical Pharmacist  04/12/2024  5:16 PM

## 2024-04-12 NOTE — Progress Notes (Signed)
 PHARMACIST - PHYSICIAN COMMUNICATION  CONCERNING:  Enoxaparin  (Lovenox ) for DVT Prophylaxis    RECOMMENDATION: Patient was prescribed enoxaprin 40mg  q24 hours for VTE prophylaxis.   Filed Weights   04/12/24 1646  Weight: 113.4 kg (250 lb)    Body mass index is 38.01 kg/m.  Estimated Creatinine Clearance: 47.2 mL/min (A) (by C-G formula based on SCr of 1.78 mg/dL (H)).   Based on [facility] policy patient is candidate for enoxaparin  0.5mg /kg TBW SQ every 24 hours based on BMI being >30.   DESCRIPTION: Pharmacy has adjusted enoxaparin  dose per [facility] policy.  Patient is now receiving enoxaparin  55 mg every 24 hours    Estill CHRISTELLA Lutes, PharmD, BCPS Clinical Pharmacist 04/12/2024 7:54 PM

## 2024-04-12 NOTE — ED Provider Notes (Signed)
 " [place] EMERGENCY DEPARTMENT AT [facility] Provider Note  CSN: [id number] Arrival date & time: 04/12/24 1631  Chief Complaint(s) Fatigue  HPI [name] [name] is a 69 y.o. male 69 year old history of obesity, diabetes, hypertension presenting with weakness.  Patient reports weakness and fatigue for the past couple days, decreased p.o. intake due to decreased appetite.  Had not noticed fevers or chills.  Reports feeling dehydrated.  He reports some right sided flank and abdominal pain, thought he might have a kidney stone based off of symptoms but not as bad as his old kidney stone.  He does report some pain with urination.  Also been having some cough for the past couple weeks.  Reports occasional nausea but no vomiting, diarrhea.  No headache.   Past Medical History Past Medical History:  Diagnosis Date   Anxiety    Coronary atherosclerosis of native coronary artery    DES distal circumflex 2005; DES LAD/diagonal bifurcation 09/2010; DES ostial LAD and DES left PL 01/2020; CABG May 2022 (LIMA to LAD, SVG to diagonal, SVG to OM1 and OM 3)   Essential hypertension    GERD (gastroesophageal reflux disease)    History of kidney stones    Mixed hyperlipidemia    Myocardial infarction (HCC)    Anterolateral with VF arrest 7/12   Type 2 diabetes mellitus (HCC)    Patient Active Problem List   Diagnosis Date Noted   Sepsis (HCC) 04/12/2024   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   Hx of non-ST elevation myocardial infarction (NSTEMI) 08/19/2020   Accelerating angina (HCC)    Type 2 diabetes mellitus with complication, with long-term current use of insulin  (HCC)    Coronary artery disease involving native coronary artery of native heart with unstable angina pectoris (HCC) 02/07/2020   Bleeds easily 07/20/2019   BMI 36.0-36.9,adult 07/19/2019   Benign prostatic hyperplasia with urinary hesitancy 07/19/2019   Gastroesophageal reflux disease without esophagitis 07/19/2019   Old complex tear  of medial meniscus of left knee    Old peripheral tear of lateral meniscus of left knee    Acquired trigger finger 02/15/2015   Essential hypertension, benign 10/23/2010   Hyperlipidemia associated with type 2 diabetes mellitus (HCC) 10/23/2010   Home Medication(s) Prior to Admission medications  Medication Sig Start Date End Date Taking? Authorizing Provider  Accu-Chek Softclix Lancets lancets Test BS 4-6X as needed daioly Dx E11.8 12/10/23   Gladis Mustard, FNP  amoxicillin -clavulanate (AUGMENTIN ) 875-125 MG tablet Take 1 tablet by mouth 2 (two) times daily. 03/27/24   Gladis Mary-Margaret, FNP  aspirin  EC 81 MG tablet Take 1 tablet (81 mg total) by mouth daily. Swallow whole. 03/19/21   Debera Jayson MATSU, MD  atorvastatin  (LIPITOR ) 80 MG tablet Take 1 tablet (80 mg total) by mouth daily. 01/25/24   Gladis Mustard, FNP  blood glucose meter kit and supplies Dispense based on patient and insurance preference. Use up to four times daily as directed. (FOR ICD-10 E10.9, E11.9). 04/26/19   Gladis Mary-Margaret, FNP  Blood Glucose Monitoring Suppl DEVI 1 each by Does not apply route in the morning, at noon, and at bedtime. May substitute to any manufacturer covered by patient's insurance. 11/16/23   Gladis, Mary-Margaret, FNP  carvedilol  (COREG ) 6.25 MG tablet Take 1 tablet (6.25 mg total) by mouth 2 (two) times daily. 01/25/24   Gladis Mary-Margaret, FNP  CINNAMON  PO Take 1,000 mg by mouth 2 (two) times daily.    [provider]  Continuous Glucose Sensor (DEXCOM  G7 SENSOR) MISC Change sensor every 10 days 11/15/23   Gladis, Mary-Margaret, FNP  CRANBERRY PO Take 4,200 mg by mouth 2 (two) times daily.    [provider]  furosemide  (LASIX ) 40 MG tablet Take 1 tablet (40 mg total) by mouth daily. 01/25/24   Gladis Mustard, FNP  glucose blood (ACCU-CHEK GUIDE TEST) test strip Test BS 4-6x daily as needed Dx E11.8 12/10/23   Gladis Mustard, FNP  insulin   glargine (LANTUS  SOLOSTAR) 100 UNIT/ML Solostar Pen 47u qam and 78u q evening 10/22/23   Gladis, Mary-Margaret, FNP  Insulin  Pen Needle (EMBECTA PEN NEEDLE ULTRAFINE) 31G X 8 MM MISC USE 1 PEN NEEDLE UP TO 8 TIMES DAILY AS NEEDED WITH INSULIN  Dx E11.8 04/10/24   Gladis Mustard, FNP  isosorbide  mononitrate (IMDUR ) 30 MG 24 hr tablet Take 1 tablet (30 mg total) by mouth in the morning and at bedtime. 01/25/24   Gladis, Mary-Margaret, FNP  lisinopril  (ZESTRIL ) 5 MG tablet Take 1 tablet (5 mg total) by mouth daily. 01/25/24   Gladis Mary-Margaret, FNP  loratadine  (CLARITIN ) 10 MG tablet Take 10 mg by mouth daily. 02/15/15   [provider]  metFORMIN  (GLUCOPHAGE ) 500 MG tablet Take 2 tablets (1,000 mg total) by mouth 2 (two) times daily. 01/25/24   Gladis, Mary-Margaret, FNP  nitroGLYCERIN  (NITROSTAT ) 0.4 MG SL tablet Place 1 tablet (0.4 mg total) under the tongue every 5 (five) minutes x 3 doses as needed for chest pain (if no relief after 2nd dose, proceed to ED or call 911). 06/29/23   Debera Jayson MATSU, MD  NOVOLOG  FLEXPEN 100 UNIT/ML FlexPen INJECT 48-78 UNITS SUBCUTANEOUSLY TWICE DAILY PER  SLIDING  SCALE 01/25/24   Gladis, Mary-Margaret, FNP  ondansetron  (ZOFRAN ) 4 MG tablet Take 1 tablet (4 mg total) by mouth every 8 (eight) hours as needed for nausea or vomiting. 03/20/24   Harris, Abigail, PA-C  oxyCODONE  (ROXICODONE ) 5 MG immediate release tablet Take 0.5-1 tablets (2.5-5 mg total) by mouth every 4 (four) hours as needed for severe pain (pain score 7-10). 03/20/24   Harris, Abigail, PA-C  pantoprazole  (PROTONIX ) 40 MG tablet Take 1 tablet (40 mg total) by mouth daily as needed (for acid reflux). 01/25/24   Gladis Mary-Margaret, FNP  prasugrel  (EFFIENT ) 10 MG TABS tablet Take 1 tablet (10 mg total) by mouth daily. 07/14/23   Anner Alm ORN, MD  promethazine -dextromethorphan (PROMETHAZINE -DM) 6.25-15 MG/5ML syrup Take 5 mLs by mouth 4 (four) times daily as needed for cough. 03/27/24    Gladis, Mary-Margaret, FNP  Semaglutide , 2 MG/DOSE, (OZEMPIC , 2 MG/DOSE,) 8 MG/3ML SOPN Inject 2 mg into the skin once a week. 01/25/24   Gladis Mary-Margaret, FNP  tamsulosin  (FLOMAX ) 0.4 MG CAPS capsule Take 1 capsule (0.4 mg total) by mouth at bedtime. Patient not taking: Reported on 03/27/2024 01/25/24   Gladis Mustard, FNP  tamsulosin  (FLOMAX ) 0.4 MG CAPS capsule Take 1 capsule (0.4 mg total) by mouth 2 (two) times daily. 03/20/24   Harris, Abigail, PA-C  Past Surgical History Past Surgical History:  Procedure Laterality Date   CARDIAC CATHETERIZATION  2012   CAROTID STENT     stents x 2    CHOLECYSTECTOMY N/A 12/04/2015   Procedure: LAPAROSCOPIC CHOLECYSTECTOMY;  Surgeon: Oneil Budge, MD;  Location: [facility];  Service: General;  Laterality: N/A;   CHOLECYSTECTOMY, LAPAROSCOPIC  12/05/2015   CORONARY ANGIOPLASTY  2012   STENT X 1 2012, STENT X 1 YRS BEFORE   CORONARY ARTERY BYPASS GRAFT N/A 08/21/2020   Procedure: CORONARY ARTERY BYPASS GRAFTING (CABG) X 4, USING LEFT INTERNAL MAMMARY ARTERY AND RIGHT GREATER SAPHENOUS VEIN HARVESTED ENDOSCOPICALLY. SVG TO OM1, OM3 SEQUENTIALLY, SVG TO DIAG., LIMA TO LAD;  Surgeon: Kerrin Elspeth BROCKS, MD;  Location: [facility] OR;  Service: Open Heart Surgery;  Laterality: N/A;   CORONARY ATHERECTOMY N/A 02/09/2020   Procedure: CORONARY ATHERECTOMY;  Surgeon: Darron Deatrice LABOR, MD;  Location: [facility] INVASIVE CV LAB;  Service: Cardiovascular;  Laterality: N/A;   CORONARY IMAGING/OCT N/A 05/22/2020   Procedure: INTRAVASCULAR IMAGING/OCT;  Surgeon: Dann Candyce RAMAN, MD;  Location: [facility] INVASIVE CV LAB;  Service: Cardiovascular;  Laterality: N/A;   CORONARY LITHOTRIPSY N/A 07/15/2023   Procedure: CORONARY LITHOTRIPSY;  Surgeon: Jordan, Peter M, MD;  Location: [facility] INVASIVE CV LAB;  Service: Cardiovascular;  Laterality: N/A;   CORONARY  STENT INTERVENTION N/A 02/09/2020   Procedure: CORONARY STENT INTERVENTION;  Surgeon: Darron Deatrice LABOR, MD;  Location: [facility] INVASIVE CV LAB;  Service: Cardiovascular;  Laterality: N/A;   CORONARY STENT INTERVENTION N/A 05/22/2020   Procedure: CORONARY STENT INTERVENTION;  Surgeon: Dann Candyce RAMAN, MD;  Location: [facility] INVASIVE CV LAB;  Service: Cardiovascular;  Laterality: N/A;   CORONARY STENT INTERVENTION N/A 07/15/2023   Procedure: CORONARY STENT INTERVENTION;  Surgeon: Jordan, Peter M, MD;  Location: [facility] INVASIVE CV LAB;  Service: Cardiovascular;  Laterality: N/A;   CORONARY ULTRASOUND/IVUS N/A 02/09/2020   Procedure: Intravascular Ultrasound/IVUS;  Surgeon: Darron Deatrice LABOR, MD;  Location: [facility] INVASIVE CV LAB;  Service: Cardiovascular;  Laterality: N/A;   ENDOVEIN HARVEST OF GREATER SAPHENOUS VEIN Right 08/21/2020   Procedure: ENDOVEIN HARVEST OF GREATER SAPHENOUS VEIN;  Surgeon: Kerrin Elspeth BROCKS, MD;  Location: [facility] OR;  Service: Open Heart Surgery;  Laterality: Right;   HYDROCELE EXCISION Bilateral 06/02/2017   Procedure: HYDROCELECTOMY ADULT;  Surgeon: Devere Lonni Righter, MD;  Location: [facility]S;  Service: Urology;  Laterality: Bilateral;  ONLY NEEDS 45 MIN   INGUINAL HERNIA REPAIR     RIGHT GROIN   KNEE ARTHROSCOPY Left 05/23/2019   Procedure: LEFT KNEE ARTHROSCOPY AND DEBRIDEMENT PARTIAL MEDIAL MENISECTOMY;  Surgeon: Harden Jerona GAILS, MD;  Location: [facility];  Service: Orthopedics;  Laterality: Left;   LEFT HEART CATH AND CORONARY ANGIOGRAPHY N/A 02/07/2020   Procedure: LEFT HEART CATH AND CORONARY ANGIOGRAPHY;  Surgeon: Anner Alm ORN, MD;  Location: [facility] INVASIVE CV LAB;  Service: Cardiovascular;  Laterality: N/A;   LEFT HEART CATH AND CORONARY ANGIOGRAPHY N/A 05/22/2020   Procedure: LEFT HEART CATH AND CORONARY ANGIOGRAPHY;  Surgeon: Dann Candyce RAMAN, MD;  Location: [facility] INVASIVE CV LAB;  Service: Cardiovascular;  Laterality: N/A;   LEFT HEART CATH AND CORONARY  ANGIOGRAPHY N/A 08/15/2020   Procedure: LEFT HEART CATH AND CORONARY ANGIOGRAPHY;  Surgeon: Court Dorn PARAS, MD;  Location: [facility] INVASIVE CV LAB;  Service: Cardiovascular;  Laterality: N/A;   RIGHT/LEFT HEART CATH AND CORONARY/GRAFT ANGIOGRAPHY N/A 07/14/2023   Procedure: RIGHT/LEFT HEART CATH AND CORONARY/GRAFT ANGIOGRAPHY;  Surgeon: Anner Alm ORN, MD;  Location: [facility]  INVASIVE CV LAB;  Service: Cardiovascular;  Laterality: N/A;   TEE WITHOUT CARDIOVERSION N/A 08/21/2020   Procedure: TRANSESOPHAGEAL ECHOCARDIOGRAM (TEE);  Surgeon: Kerrin Elspeth BROCKS, MD;  Location: [facility] OR;  Service: Open Heart Surgery;  Laterality: N/A;   TRIGGER FINGER RELEASE Right 01/08/2015   Procedure: RELEASE TRIGGER FINGER/A-1 PULLEY RIGHT MIDDLE FINGER;  Surgeon: Arley Curia, MD;  Location: [facility];  Service: Orthopedics;  Laterality: Right;   Family History Family History  Problem Relation Age of Onset   Hyperlipidemia Mother    Hypertension Mother    Hyperlipidemia Father    Hypertension Father    Diabetes Father    Dementia Father    Diabetes Maternal Grandfather     Social History Social History[1] Allergies Patient has no known allergies.  Review of Systems Review of Systems  All other systems reviewed and are negative.   Physical Exam Vital Signs  I have reviewed the triage vital signs BP 97/62   Pulse 93   Temp (!) 102.5 F (39.2 C) (Rectal)   Resp 20   Ht 5' 8 (1.727 m)   Wt 113.4 kg   SpO2 94%   BMI 38.01 kg/m  Physical Exam Vitals and nursing note reviewed.  Constitutional:      General: He is not in acute distress.    Appearance: Normal appearance. He is obese. He is ill-appearing.  HENT:     Mouth/Throat:     Mouth: Mucous membranes are dry.  Eyes:     Conjunctiva/sclera: Conjunctivae normal.  Cardiovascular:     Rate and Rhythm: Regular rhythm. Tachycardia present.  Pulmonary:     Effort: Pulmonary effort is normal. No respiratory distress.     Breath  sounds: Normal breath sounds.     Comments: Distant breath sounds but no obvious crackles Abdominal:     General: Abdomen is flat.     Palpations: Abdomen is soft.     Tenderness: There is no abdominal tenderness. There is no right CVA tenderness or left CVA tenderness.  Musculoskeletal:     Right lower leg: No edema.     Left lower leg: No edema.  Skin:    General: Skin is warm and dry.     Capillary Refill: Capillary refill takes less than 2 seconds.  Neurological:     Mental Status: He is alert and oriented to person, place, and time. Mental status is at baseline.  Psychiatric:        Mood and Affect: Mood normal.        Behavior: Behavior normal.     ED Results and Treatments Labs (all labs ordered are listed, but only abnormal results are displayed) Labs Reviewed  LACTIC ACID, PLASMA - Abnormal; Notable for the following components:      Result Value   Lactic Acid, Venous 3.8 (*)    All other components within normal limits  COMPREHENSIVE METABOLIC PANEL WITH GFR - Abnormal; Notable for the following components:   Glucose, Bld 137 (*)    BUN 32 (*)    Creatinine, Ser 1.78 (*)    GFR, Estimated 41 (*)    Anion gap 17 (*)    All other components within normal limits  CBC WITH DIFFERENTIAL/PLATELET - Abnormal; Notable for the following components:   WBC 19.3 (*)    RBC 4.18 (*)    Hemoglobin 11.5 (*)    HCT 37.6 (*)    RDW 16.8 (*)    Neutro Abs 15.4 (*)  Monocytes Absolute 2.5 (*)    Abs Immature Granulocytes 0.40 (*)    All other components within normal limits  PROTIME-INR - Abnormal; Notable for the following components:   Prothrombin Time 16.4 (*)    All other components within normal limits  URINALYSIS, W/ REFLEX TO CULTURE (INFECTION SUSPECTED) - Abnormal; Notable for the following components:   Color, Urine AMBER (*)    APPearance CLOUDY (*)    Hgb urine dipstick MODERATE (*)    Protein, ur 100 (*)    Leukocytes,Ua MODERATE (*)    Bacteria, UA FEW (*)     All other components within normal limits  LACTIC ACID, PLASMA - Abnormal; Notable for the following components:   Lactic Acid, Venous 2.2 (*)    All other components within normal limits  CBG MONITORING, ED - Abnormal; Notable for the following components:   Glucose-Capillary 144 (*)    All other components within normal limits  RESP PANEL BY RT-PCR (RSV, FLU A&B, COVID)  RVPGX2  URINE CULTURE  CULTURE, BLOOD (ROUTINE X 2)  CULTURE, BLOOD (ROUTINE X 2)  LACTIC ACID, PLASMA  HIV ANTIBODY (ROUTINE TESTING W REFLEX)  HEMOGLOBIN A1C                                                                                                                          Radiology CT ABDOMEN PELVIS W CONTRAST Result Date: 04/12/2024 EXAM: CT ABDOMEN AND PELVIS WITH CONTRAST 04/12/2024 06:32:52 PM TECHNIQUE: CT of the abdomen and pelvis was performed with the administration of 80 mL of iohexol  (OMNIPAQUE ) 300 MG/ML solution. Multiplanar reformatted images are provided for review. Automated exposure control, iterative reconstruction, and/or weight-based adjustment of the mA/kV was utilized to reduce the radiation dose to as low as reasonably achievable. COMPARISON: 03/20/2024 CLINICAL HISTORY: Abdominal pain, acute, nonlocalized. Sepsis. Fatigue, anorexia, cough. FINDINGS: LOWER CHEST: No acute abnormality. LIVER: Moderate hepatic steatosis. No intrahepatic mass. No intrahepatic biliary ductal dilation. GALLBLADDER AND BILE DUCTS: Status post cholecystectomy. No biliary ductal dilatation. SPLEEN: No acute abnormality. PANCREAS: No acute abnormality. ADRENAL GLANDS: No acute abnormality. KIDNEYS, URETERS AND BLADDER: Simple cortical cyst within the interpolar region of the right kidney for which no follow up imaging is recommended. No stones in the kidneys or ureters. No hydronephrosis. No perinephric or periureteral stranding. The bladder is decompressed. There is, however, circumferential bladder wall thickening and  moderate perivesicular inflammatory stranding suggesting a superimposed infectious or inflammatory cystitis. Correlation with urinalysis and urine culture may be helpful for further management. Moderate prostatic hypertrophy with superimposed periprosthetic inflammatory stranding which involves the seminal vesicles as well. Together, and with the findings involving the bladder, the findings may reflect changes of infectious cystoprostatitis. GI AND BOWEL: Appendix normal. The stomach, small bowel, and large bowel are otherwise unremarkable. There is no bowel obstruction. PERITONEUM AND RETROPERITONEUM: No ascites. No free air. VASCULATURE: Aorta is normal in caliber. There is extensive aortoiliac atherosclerotic calcification. Particularly prominent atherosclerotic calcification is seen at the  origin of the mesenteric vasculature, though the degree of stenosis is not well assessed on this non-arteriographic study. Clinical correlation for signs and symptoms of chronic mesenteric ischemia are recommended. If present, CT arteriography would be helpful for further evaluation. LYMPH NODES: No lymphadenopathy. REPRODUCTIVE ORGANS: No acute abnormality. BONES AND SOFT TISSUES: Small fat-containing umbilical hernia. Mild subcutaneous body wall edema. Moderate fat-containing left inguinal hernia. Osseous structures are age appropriate. No acute bone abnormality. No lytic or blastic bone lesion. IMPRESSION: 1. Circumferential bladder wall thickening and moderate perivesicular inflammatory stranding, suggestive of infectious or inflammatory cystitis; urinalysis and urine culture may be helpful for further management. 2. Moderate prostatic hypertrophy with superimposed periprostatic and perivesicular inflammatory stranding, suggestive of infectious cystoprostatitis. 3. Prominent atherosclerotic calcification at the origin of the mesenteric vasculature with stenosis not well assessed on this non-arteriographic study; assess for  symptoms of chronic mesenteric ischemia, and if present CT arteriography would be helpful for further evaluation. 4. Moderate hepatic steatosis. 5. Small fat-containing umbilical hernia and moderate fat-containing left inguinal hernia. 6. RAF score includes Aortic atherosclerosis (ICD10-I70.0). Electronically signed by: Dorethia Molt MD 04/12/2024 06:47 PM EST RP Workstation: [id number]   DG Chest Port 1 View Result Date: 04/12/2024 CLINICAL DATA:  Questionable sepsis. EXAM: PORTABLE CHEST 1 VIEW COMPARISON:  Chest radiograph dated 07/14/2023. FINDINGS: Minimal left lung base atelectasis. No focal consolidation, pleural effusion, pneumothorax. Stable cardiac silhouette. Median sternotomy wires. No acute osseous pathology. IMPRESSION: No active disease. Electronically Signed   By: Vanetta Chou M.D.   On: 04/12/2024 17:29    Pertinent labs & imaging results that were available during my care of the patient were reviewed by me and considered in my medical decision making (see MDM for details).  Medications Ordered in ED Medications  lactated ringers  infusion ( Intravenous New Bag/Given 04/12/24 1935)  atorvastatin  (LIPITOR ) tablet 80 mg (has no administration in time range)  tamsulosin  (FLOMAX ) capsule 0.4 mg (has no administration in time range)  acetaminophen  (TYLENOL ) tablet 650 mg (has no administration in time range)  prochlorperazine  (COMPAZINE ) injection 5 mg (has no administration in time range)  melatonin tablet 6 mg (has no administration in time range)  polyethylene glycol (MIRALAX  / GLYCOLAX ) packet 17 g (has no administration in time range)  oxyCODONE  (Oxy IR/ROXICODONE ) immediate release tablet 5-10 mg (has no administration in time range)  insulin  aspart (novoLOG ) injection 0-9 Units (has no administration in time range)  insulin  aspart (novoLOG ) injection 0-5 Units (has no administration in time range)  enoxaparin  (LOVENOX ) injection 55 mg (has no administration in time range)   lactated ringers  bolus 1,000 mL (0 mLs Intravenous Stopped 04/12/24 1755)    And  lactated ringers  bolus 1,000 mL (0 mLs Intravenous Stopped 04/12/24 1757)    And  lactated ringers  bolus 200 mL (0 mLs Intravenous Stopped 04/12/24 1733)  ceFEPIme  (MAXIPIME ) 2 g in sodium chloride  0.9 % 100 mL IVPB (2 g Intravenous New Bag/Given 04/12/24 1936)  metroNIDAZOLE  (FLAGYL ) IVPB 500 mg (0 mg Intravenous Stopped 04/12/24 1826)  vancomycin  (VANCOCIN ) IVPB 1000 mg/200 mL premix (0 mg Intravenous Stopped 04/12/24 1826)  acetaminophen  (TYLENOL ) tablet 1,000 mg (1,000 mg Oral Given 04/12/24 1729)  iohexol  (OMNIPAQUE ) 300 MG/ML solution 80 mL (80 mLs Intravenous Contrast Given 04/12/24 1817)  Procedures .Critical Care  Performed by: Francesca Elsie CROME, MD Authorized by: Francesca Elsie CROME, MD   Critical care provider statement:    Critical care time (minutes):  30   Critical care was necessary to treat or prevent imminent or life-threatening deterioration of the following conditions:  Sepsis   Critical care was time spent personally by me on the following activities:  Development of treatment plan with patient or surrogate, discussions with consultants, evaluation of patient's response to treatment, examination of patient, ordering and review of laboratory studies, ordering and review of radiographic studies, ordering and performing treatments and interventions, pulse oximetry, re-evaluation of patient's condition and review of old charts   (including critical care time)  Medical Decision Making / ED Course   MDM:  69 year old presenting with weakness, decreased p.o. intake and fatigue.  Suspect underlying infectious process, patient meets sepsis criteria with hypotension, tachycardia, tachypnea, fever.  Also found to be hypoxic and placed on 4 L nasal  cannula.  Differential includes pneumonia although lungs seem overall fairly clear, differential also includes process such as urinary tract infection or sepsis superimposed upon renal stone given symptoms.  Will obtain CT abdomen.  Also obtain chest x-ray, my interpretation with no obvious acute infiltrate but will follow radiologist read.  Labs notable for leukocytosis.  Doubt skin or soft tissue infection, CNS infection.  CT abdomen also evaluate for intra-abdominal process.  Will reassess  Clinical Course as of 04/12/24 2010  Wed Apr 12, 2024  2009 Workup shows signs of cystitis, possible prostatitis, UA is positive.  Patient has been covered with broad-spectrum antibiotics.  CT scan without evidence of any obstructive uropathy, no hydronephrosis.  Was seen recently for renal stone but per radiology report, in comparison to prior images this is not present.  Discussed results with patient who is agreeable to admission given underlying sepsis.  Discussed with hospitalist who will admit patient. [WS]    Clinical Course User Index [WS] Francesca Elsie CROME, MD     Additional history obtained: -Additional history obtained from spouse -External records from outside source obtained and reviewed including: Chart review including previous notes, labs, imaging, consultation notes including prior notes    Lab Tests: -I ordered, reviewed, and interpreted labs.   The pertinent results include:   Labs Reviewed  LACTIC ACID, PLASMA - Abnormal; Notable for the following components:      Result Value   Lactic Acid, Venous 3.8 (*)    All other components within normal limits  COMPREHENSIVE METABOLIC PANEL WITH GFR - Abnormal; Notable for the following components:   Glucose, Bld 137 (*)    BUN 32 (*)    Creatinine, Ser 1.78 (*)    GFR, Estimated 41 (*)    Anion gap 17 (*)    All other components within normal limits  CBC WITH DIFFERENTIAL/PLATELET - Abnormal; Notable for the following components:    WBC 19.3 (*)    RBC 4.18 (*)    Hemoglobin 11.5 (*)    HCT 37.6 (*)    RDW 16.8 (*)    Neutro Abs 15.4 (*)    Monocytes Absolute 2.5 (*)    Abs Immature Granulocytes 0.40 (*)    All other components within normal limits  PROTIME-INR - Abnormal; Notable for the following components:   Prothrombin Time 16.4 (*)    All other components within normal limits  URINALYSIS, W/ REFLEX TO CULTURE (INFECTION SUSPECTED) - Abnormal; Notable for the following components:   Color, Urine AMBER (*)  APPearance CLOUDY (*)    Hgb urine dipstick MODERATE (*)    Protein, ur 100 (*)    Leukocytes,Ua MODERATE (*)    Bacteria, UA FEW (*)    All other components within normal limits  LACTIC ACID, PLASMA - Abnormal; Notable for the following components:   Lactic Acid, Venous 2.2 (*)    All other components within normal limits  CBG MONITORING, ED - Abnormal; Notable for the following components:   Glucose-Capillary 144 (*)    All other components within normal limits  RESP PANEL BY RT-PCR (RSV, FLU A&B, COVID)  RVPGX2  URINE CULTURE  CULTURE, BLOOD (ROUTINE X 2)  CULTURE, BLOOD (ROUTINE X 2)  LACTIC ACID, PLASMA  HIV ANTIBODY (ROUTINE TESTING W REFLEX)  HEMOGLOBIN A1C    Notable for leukocytosis, lactic acidosis, aki   Imaging Studies ordered: I ordered imaging studies including CT scan On my interpretation imaging demonstrates UTI  I independently visualized and interpreted imaging. I agree with the radiologist interpretation   Medicines ordered and prescription drug management: Meds ordered this encounter  Medications   lactated ringers  infusion   AND Linked Order Group    lactated ringers  bolus 1,000 mL     BMI >/= 35, Ideal Body Weight basis in kg for 30 mL/kg bolus delivery:   68.4    lactated ringers  bolus 1,000 mL     BMI >/= 35, Ideal Body Weight basis in kg for 30 mL/kg bolus delivery:   68.4    lactated ringers  bolus 200 mL     BMI >/= 35, Ideal Body Weight basis in kg for  30 mL/kg bolus delivery:   68.4   ceFEPIme  (MAXIPIME ) 2 g in sodium chloride  0.9 % 100 mL IVPB    Antibiotic Indication::   Other Indication (list below)    Other Indication::   Unknown Source.   metroNIDAZOLE  (FLAGYL ) IVPB 500 mg    Antibiotic Indication::   Other Indication (list below)    Other Indication::   Unknown Source.   vancomycin  (VANCOCIN ) IVPB 1000 mg/200 mL premix    Indication::   Other Indication (list below)    Other Indication::   Unknown Source.   acetaminophen  (TYLENOL ) tablet 1,000 mg   iohexol  (OMNIPAQUE ) 300 MG/ML solution 80 mL   atorvastatin  (LIPITOR ) tablet 80 mg   tamsulosin  (FLOMAX ) capsule 0.4 mg   acetaminophen  (TYLENOL ) tablet 650 mg   prochlorperazine  (COMPAZINE ) injection 5 mg   melatonin tablet 6 mg   polyethylene glycol (MIRALAX  / GLYCOLAX ) packet 17 g   oxyCODONE  (Oxy IR/ROXICODONE ) immediate release tablet 5-10 mg    Refill:  0   DISCONTD: enoxaparin  (LOVENOX ) injection 40 mg   insulin  aspart (novoLOG ) injection 0-9 Units    Correction coverage::   Sensitive (thin, NPO, renal)    CBG < 70::   Implement Hypoglycemia Standing Orders and refer to Hypoglycemia Standing Orders sidebar report    CBG 70 - 120::   0 units    CBG 121 - 150::   1 unit    CBG 151 - 200::   2 units    CBG 201 - 250::   3 units    CBG 251 - 300::   5 units    CBG 301 - 350::   7 units    CBG 351 - 400:   9 units    CBG > 400:   call MD and obtain STAT lab verification   insulin  aspart (novoLOG ) injection 0-5 Units  Correction coverage::   HS scale    CBG < 70::   Implement Hypoglycemia Standing Orders and refer to Hypoglycemia Standing Orders sidebar report    CBG 70 - 120::   0 units    CBG 121 - 150::   0 units    CBG 151 - 200::   0 units    CBG 201 - 250::   2 units    CBG 251 - 300::   3 units    CBG 301 - 350::   4 units    CBG 351 - 400::   5 units    CBG > 400:   call MD and obtain STAT lab verification   enoxaparin  (LOVENOX ) injection 55 mg    -I have  reviewed the patients home medicines and have made adjustments as needed   Consultations Obtained: I requested consultation with the hospitalist,  and discussed lab and imaging findings as well as pertinent plan - they recommend: admission   Cardiac Monitoring: The patient was maintained on a cardiac monitor.  I personally viewed and interpreted the cardiac monitored which showed an underlying rhythm of: sinus tachycardia   Social Determinants of Health:  Diagnosis or treatment significantly limited by social determinants of health: obesity   Reevaluation: After the interventions noted above, I reevaluated the patient and found that their symptoms have improved  Co morbidities that complicate the patient evaluation  Past Medical History:  Diagnosis Date   Anxiety    Coronary atherosclerosis of native coronary artery    DES distal circumflex 2005; DES LAD/diagonal bifurcation 09/2010; DES ostial LAD and DES left PL 01/2020; CABG May 2022 (LIMA to LAD, SVG to diagonal, SVG to OM1 and OM 3)   Essential hypertension    GERD (gastroesophageal reflux disease)    History of kidney stones    Mixed hyperlipidemia    Myocardial infarction (HCC)    Anterolateral with VF arrest 7/12   Type 2 diabetes mellitus (HCC)       Dispostion: Disposition decision including need for hospitalization was considered, and patient admitted to the hospital.    Final Clinical Impression(s) / ED Diagnoses Final diagnoses:  Sepsis secondary to UTI [facility])     This chart was dictated using voice recognition software.  Despite best efforts to proofread,  errors can occur which can change the documentation meaning.     [1]  Social History Tobacco Use   Smoking status: Never    Passive exposure: Never   Smokeless tobacco: Never  Vaping Use   Vaping status: Never Used  Substance Use Topics   Alcohol use: No   Drug use: No     Francesca Elsie CROME, MD 04/12/24 2011  "

## 2024-04-12 NOTE — ED Notes (Signed)
Dr. Hall at bedside.

## 2024-04-13 DIAGNOSIS — E118 Type 2 diabetes mellitus with unspecified complications: Secondary | ICD-10-CM | POA: Diagnosis not present

## 2024-04-13 DIAGNOSIS — N390 Urinary tract infection, site not specified: Secondary | ICD-10-CM

## 2024-04-13 DIAGNOSIS — Z794 Long term (current) use of insulin: Secondary | ICD-10-CM | POA: Diagnosis not present

## 2024-04-13 DIAGNOSIS — A419 Sepsis, unspecified organism: Secondary | ICD-10-CM | POA: Diagnosis not present

## 2024-04-13 LAB — CBC
HCT: 35.3 % — ABNORMAL LOW (ref 39.0–52.0)
Hemoglobin: 10.9 g/dL — ABNORMAL LOW (ref 13.0–17.0)
MCH: 27.7 pg (ref 26.0–34.0)
MCHC: 30.9 g/dL (ref 30.0–36.0)
MCV: 89.6 fL (ref 80.0–100.0)
Platelets: 231 K/uL (ref 150–400)
RBC: 3.94 MIL/uL — ABNORMAL LOW (ref 4.22–5.81)
RDW: 16.7 % — ABNORMAL HIGH (ref 11.5–15.5)
WBC: 15.9 K/uL — ABNORMAL HIGH (ref 4.0–10.5)
nRBC: 0 % (ref 0.0–0.2)

## 2024-04-13 LAB — MAGNESIUM: Magnesium: 1.6 mg/dL — ABNORMAL LOW (ref 1.7–2.4)

## 2024-04-13 LAB — BASIC METABOLIC PANEL WITH GFR
Anion gap: 14 (ref 5–15)
BUN: 31 mg/dL — ABNORMAL HIGH (ref 8–23)
CO2: 23 mmol/L (ref 22–32)
Calcium: 8.6 mg/dL — ABNORMAL LOW (ref 8.9–10.3)
Chloride: 100 mmol/L (ref 98–111)
Creatinine, Ser: 1.42 mg/dL — ABNORMAL HIGH (ref 0.61–1.24)
GFR, Estimated: 53 mL/min — ABNORMAL LOW
Glucose, Bld: 180 mg/dL — ABNORMAL HIGH (ref 70–99)
Potassium: 4.2 mmol/L (ref 3.5–5.1)
Sodium: 137 mmol/L (ref 135–145)

## 2024-04-13 LAB — GLUCOSE, CAPILLARY
Glucose-Capillary: 145 mg/dL — ABNORMAL HIGH (ref 70–99)
Glucose-Capillary: 236 mg/dL — ABNORMAL HIGH (ref 70–99)
Glucose-Capillary: 189 mg/dL — ABNORMAL HIGH (ref 70–99)
Glucose-Capillary: 225 mg/dL — ABNORMAL HIGH (ref 70–99)

## 2024-04-13 LAB — PHOSPHORUS: Phosphorus: 2.9 mg/dL (ref 2.5–4.6)

## 2024-04-13 LAB — HEMOGLOBIN A1C
Hgb A1c MFr Bld: 7.2 % — ABNORMAL HIGH (ref 4.8–5.6)
Mean Plasma Glucose: 159.94 mg/dL

## 2024-04-13 LAB — HIV ANTIBODY (ROUTINE TESTING W REFLEX): HIV Screen 4th Generation wRfx: NONREACTIVE

## 2024-04-13 MED ORDER — PANTOPRAZOLE SODIUM 40 MG PO TBEC: 40.0000 mg | DELAYED_RELEASE_TABLET | Freq: Every day | ORAL | Status: DC | PRN

## 2024-04-13 MED ORDER — SODIUM CHLORIDE 0.9 % IV SOLN
1.0000 g | Freq: Three times a day (TID) | INTRAVENOUS | Status: DC
  Administered 2024-04-13 – 2024-04-14 (×2): 1 g via INTRAVENOUS
  Filled 2024-04-13 (×2): qty 20

## 2024-04-13 MED ORDER — INSULIN GLARGINE 100 UNIT/ML ~~LOC~~ SOLN
20.0000 [IU] | Freq: Every day | SUBCUTANEOUS | Status: DC
  Administered 2024-04-13 – 2024-04-14 (×2): 20 [IU] via SUBCUTANEOUS
  Filled 2024-04-13 (×4): qty 0.2

## 2024-04-13 MED ORDER — SACCHAROMYCES BOULARDII 250 MG PO CAPS
250.0000 mg | ORAL_CAPSULE | Freq: Two times a day (BID) | ORAL | Status: DC
  Administered 2024-04-13 – 2024-04-14 (×4): 250 mg via ORAL
  Filled 2024-04-13 (×4): qty 1

## 2024-04-13 MED ORDER — CARVEDILOL 3.125 MG PO TABS
6.2500 mg | ORAL_TABLET | Freq: Two times a day (BID) | ORAL | Status: DC
  Administered 2024-04-13 – 2024-04-14 (×2): 6.25 mg via ORAL
  Filled 2024-04-13 (×2): qty 2

## 2024-04-13 MED ORDER — MAGNESIUM SULFATE 2 GM/50ML IV SOLN
2.0000 g | Freq: Once | INTRAVENOUS | Status: AC
  Administered 2024-04-13: 2 g via INTRAVENOUS
  Filled 2024-04-13: qty 50

## 2024-04-13 NOTE — Evaluation (Signed)
 Physical Therapy Evaluation Patient Details Name: [name] [name] MRN: [id number] DOB: July 17, 1955 Today's Date: 04/13/2024  History of Present Illness  [name] [name] is a 69 y.o. male with medical history significant for coronary artery disease status post PCI with stenting in 2005, status post CABG in 2022, hypertension, type 2 diabetes, hyperlipidemia, obesity, BPH, who presents to the ED with complaints of dysuria, lower back pain, nausea, and vomiting for the past 2 days.  Associated with poor oral intake.  Today his symptoms worsened.  The pain from the dysuria was unbearable.  Denies any postprandial abdominal pain.  Denies unintentional weight loss.  He presented to the ER for further evaluation.   Clinical Impression  Patient functioning near baseline for functional mobility and gait demonstrating good return for ambulating in room, hallway without loss of balance or need for an AD, on room air with SpO2 at 94%. Patient encouraged to ambulate with nursing staff/mobility tech as tolerated for length of stay. Plan:  Patient discharged from physical therapy to care of nursing for ambulation daily as tolerated for length of stay.          If plan is discharge home, recommend the following: Help with stairs or ramp for entrance;Assist for transportation;Assistance with cooking/housework;A little help with walking and/or transfers   Can travel by private vehicle        Equipment Recommendations None recommended by PT  Recommendations for Other Services       Functional Status Assessment Patient has had a recent decline in their functional status and/or demonstrates limited ability to make significant improvements in function in a reasonable and predictable amount of time     Precautions / Restrictions Precautions Precautions: Fall Recall of Precautions/Restrictions: Intact Restrictions Weight Bearing Restrictions Per Provider Order: No      Mobility  Bed Mobility Overal bed  mobility: Modified Independent                  Transfers Overall transfer level: Independent                      Ambulation/Gait Ambulation/Gait assistance: Modified independent (Device/Increase time) Gait Distance (Feet): 100 Feet Assistive device: None Gait Pattern/deviations: WFL(Within Functional Limits) Gait velocity: decreased     General Gait Details: grossly WFL with good return for ambulating in room, hallway without loss of balance or need for an AD  Stairs            Wheelchair Mobility     Tilt Bed    Modified Rankin (Stroke Patients Only)       Balance Overall balance assessment: Independent                                           Pertinent Vitals/Pain Pain Assessment Pain Assessment: No/denies pain    Home Living Family/patient expects to be discharged to:: Private residence Living Arrangements: Spouse/significant other Available Help at Discharge: Family Type of Home: House   Entrance Stairs-Rails: None Entrance Stairs-Number of Steps: 1   Home Layout: Two level;Able to live on main level with bedroom/bathroom Home Equipment: Rolling Walker (2 wheels);Cane - single point;Shower seat - built in;Grab bars - tub/shower;Lift chair;Other (comment)      Prior Function Prior Level of Function : Independent/Modified Independent;History of Falls (last six months)  Mobility Comments: Community ambulator without AD. ADLs Comments: Independent     Extremity/Trunk Assessment   Upper Extremity Assessment Upper Extremity Assessment: Defer to OT evaluation    Lower Extremity Assessment Lower Extremity Assessment: Overall WFL for tasks assessed    Cervical / Trunk Assessment Cervical / Trunk Assessment: Normal  Communication   Communication Communication: No apparent difficulties    Cognition Arousal: Alert Behavior During Therapy: WFL for tasks assessed/performed                              Following commands: Intact       Cueing Cueing Techniques: Verbal cues     General Comments      Exercises     Assessment/Plan    PT Assessment Patient does not need any further PT services  PT Problem List         PT Treatment Interventions      PT Goals (Current goals can be found in the Care Plan section)  Acute Rehab PT Goals Patient Stated Goal: return home with family to assist PT Goal Formulation: With patient Time For Goal Achievement: 04/13/24 Potential to Achieve Goals: Good    Frequency       Co-evaluation PT/OT/SLP Co-Evaluation/Treatment: Yes Reason for Co-Treatment: To address functional/ADL transfers PT goals addressed during session: Mobility/safety with mobility;Balance         AM-PAC PT 6 Clicks Mobility  Outcome Measure Help needed turning from your back to your side while in a flat bed without using bedrails?: None Help needed moving from lying on your back to sitting on the side of a flat bed without using bedrails?: None Help needed moving to and from a bed to a chair (including a wheelchair)?: None Help needed standing up from a chair using your arms (e.g., wheelchair or bedside chair)?: None Help needed to walk in hospital room?: A Little Help needed climbing 3-5 steps with a railing? : A Little 6 Click Score: 22    End of Session   Activity Tolerance: Patient tolerated treatment well;Patient limited by fatigue Patient left: in chair;with call bell/phone within reach Nurse Communication: Mobility status PT Visit Diagnosis: Unsteadiness on feet (R26.81);Other abnormalities of gait and mobility (R26.89);Muscle weakness (generalized) (M62.81)    Time: 9096-9080 PT Time Calculation (min) (ACUTE ONLY): 16 min   Charges:   PT Evaluation $PT Eval Low Complexity: 1 Low PT Treatments $Therapeutic Activity: 8-22 mins PT General Charges $$ ACUTE PT VISIT: 1 Visit        2:13 PM, 04/13/24 Lynwood Music,  MPT Physical Therapist with [facility] [phone] office [phone] mobile phone

## 2024-04-13 NOTE — Evaluation (Signed)
 Occupational Therapy Evaluation Patient Details Name: [name] [name] MRN: [id number] DOB: 08-Nov-1955 Today's Date: 04/13/2024   History of Present Illness   [name] [name] is a 69 y.o. male with medical history significant for coronary artery disease status post PCI with stenting in 2005, status post CABG in 2022, hypertension, type 2 diabetes, hyperlipidemia, obesity, BPH, who presents to the ED with complaints of dysuria, lower back pain, nausea, and vomiting for the past 2 days.  Associated with poor oral intake.  Today his symptoms worsened.  The pain from the dysuria was unbearable.  Denies any postprandial abdominal pain.  Denies unintentional weight loss.  He presented to the ER for further evaluation. (per DO)     Clinical Impressions Pt agreeable to OT and PT co-evaluation. Pt appears to be near baseline function with no assist needed for bed mobility, transfers, or ADL's other than socks/shoes which the pt is assisted with at baseline. Pt left on room air with WFL saturation. Pt left in the chair with family present. Pt is not recommended for any further acute OT services and will be discharged to care of nursing staff for remaining length of stay.                Functional Status Assessment   Patient has not had a recent decline in their functional status     Equipment Recommendations   None recommended by OT            Precautions/Restrictions   Precautions Precautions: Fall Recall of Precautions/Restrictions: Intact Restrictions Weight Bearing Restrictions Per Provider Order: No     Mobility Bed Mobility Overal bed mobility: Modified Independent             General bed mobility comments: HOB elevated.    Transfers Overall transfer level: Independent                        Balance Overall balance assessment: Independent                                         ADL either performed or assessed with clinical  judgement   ADL Overall ADL's : Independent (Wife assist with donning socks/shoes at baseline. Independent otherwise.)                                             Vision Baseline Vision/History: 1 Wears glasses Ability to See in Adequate Light: 0 Adequate Patient Visual Report: No change from baseline Vision Assessment?: No apparent visual deficits     Perception Perception: Not tested       Praxis Praxis: Not tested       Pertinent Vitals/Pain Pain Assessment Pain Assessment: No/denies pain     Extremity/Trunk Assessment Upper Extremity Assessment Upper Extremity Assessment: Overall WFL for tasks assessed   Lower Extremity Assessment Lower Extremity Assessment: Defer to PT evaluation   Cervical / Trunk Assessment Cervical / Trunk Assessment: Normal   Communication Communication Communication: No apparent difficulties   Cognition Arousal: Alert Behavior During Therapy: WFL for tasks assessed/performed Cognition: No apparent impairments  Following commands: Intact       Cueing  General Comments   Cueing Techniques: Verbal cues  On room air pt's saturaton remained 90 or above most of the time with some dips into 89% SpO2.              Home Living Family/patient expects to be discharged to:: Private residence Living Arrangements: Spouse/significant other Available Help at Discharge: Family (most the time) Type of Home: House Home Access: Stairs to enter [employer] of Steps: 1 Entrance Stairs-Rails: None Home Layout: Two level;Able to live on main level with bedroom/bathroom     Bathroom Shower/Tub: Producer, Television/film/video: Handicapped height Bathroom Accessibility: Yes How Accessible: Accessible via wheelchair;Accessible via walker Home Equipment: Rolling Walker (2 wheels);Cane - single point;Shower seat - built in;Grab bars - tub/shower;Lift chair;Other (comment)  (adjustable bed)          Prior Functioning/Environment Prior Level of Function : Independent/Modified Independent;History of Falls (last six months) (1 fall)             Mobility Comments: Community ambulator without AD. ADLs Comments: Independent                            Co-evaluation PT/OT/SLP Co-Evaluation/Treatment: Yes Reason for Co-Treatment: To address functional/ADL transfers   OT goals addressed during session: ADL's and self-care                       End of Session    Activity Tolerance: Patient tolerated treatment well Patient left: in chair;with family/visitor present  OT Visit Diagnosis: History of falling (Z91.81);Unsteadiness on feet (R26.81)                Time: 9094-9078 OT Time Calculation (min): 16 min Charges:  OT General Charges $OT Visit: 1 Visit OT Evaluation $OT Eval Low Complexity: 1 Low  Fiona Coto OT, MOT  Jayson Person 04/13/2024, 10:53 AM

## 2024-04-13 NOTE — Plan of Care (Signed)
  Problem: Skin Integrity: Goal: Risk for impaired skin integrity will decrease Outcome: Progressing   Problem: Activity: Goal: Risk for activity intolerance will decrease Outcome: Progressing

## 2024-04-13 NOTE — Progress Notes (Signed)
" °   04/13/24 1050  TOC Brief Assessment  Insurance and Status Reviewed  Patient has primary care physician Yes  Home environment has been reviewed From Home  Prior level of function: Independent  Prior/Current Home Services No current home services  Social Drivers of Health Review SDOH reviewed no interventions necessary  Readmission risk has been reviewed Yes  Transition of care needs no transition of care needs at this time   Inpatient Care Management (ICM) has reviewed patient and no other ICM needs have been identified at this time. We will continue to monitor patient advancement through interdisciplinary progression rounds. If new patient transition needs arise, please place a ICM consult. "

## 2024-04-13 NOTE — Plan of Care (Signed)

## 2024-04-13 NOTE — Progress Notes (Signed)
 Pt placed on 3L [place], after checking 0100 vitals; SPO2 was 80 on RA, RR 20. SPO2 now 93 on 3L [place], RR 16. Pt has no complaints of pain or feeling short of breath. States that he does not wear home oxygen.

## 2024-04-13 NOTE — Plan of Care (Signed)
   Problem: Nutritional: Goal: Maintenance of adequate nutrition will improve Outcome: Progressing   Problem: Skin Integrity: Goal: Risk for impaired skin integrity will decrease Outcome: Progressing   Problem: Education: Goal: Knowledge of General Education information will improve Description: Including pain rating scale, medication(s)/side effects and non-pharmacologic comfort measures Outcome: Progressing

## 2024-04-13 NOTE — Progress Notes (Signed)
 " PROGRESS NOTE    [name]  FMW:[id number] DOB: 17-Aug-1955 DOA: 04/12/2024 PCP: Gladis Mustard, FNP    Brief Narrative:  [name] [name] is a 69 y.o. male with medical history significant for coronary artery disease status post PCI with stenting in 2005, status post CABG in 2022, hypertension, type 2 diabetes, hyperlipidemia, obesity, BPH, who presents to the ED with complaints of dysuria, lower back pain, nausea, and vomiting for the past 2 days.  Associated with poor oral intake.  Today his symptoms worsened.  The pain from the dysuria was unbearable.  Denies any postprandial abdominal pain.  Denies unintentional weight loss.  He presented to the ER for further evaluation.  CT abdomen and pelvis with contrast revealed circumferential bladder wall thickening and moderate perivesicular inflammatory stranding suggestive of infectious or inflammatory cystitis.  Moderate prostatic hypertrophy with superimposed periprostatic and perivesicular inflammatory stranding suggestive of infectious cystoprostatitis.   Assessment and Plan:  Sepsis secondary to cystoprostatitis, seen on CT scan, POA. Cystoprostatitis seen on CT scan. Previous history of ESBL E. coli UTI in 2023 Received 1 dose of cefepime , IV vancomycin , and IV Flagyl  in the ER. Switched to Merrem  Follow-up peripheral blood cultures x 2 and urine culture for ID and sensitivities -will need 4 to 6 weeks of antibiotics for prostatitis.  Florastor 250 mg twice daily probiotics added.    AKI, prerenal in the setting of dehydration from poor oral intake At baseline creatinine is 0.7 with GFR greater than 60 Presented with creatinine of 1.78 with GFR 41. - A.m. labs   Coronary artery disease status post PCI with stenting and CABG Denies any anginal symptoms. Resume home regimen, aspirin , prasugrel , Lipitor .   BPH Resume home Flomax  Monitor urine output   Type 2 diabetes with hyperglycemia Last hemoglobin A1c 7.3 on  07/14/2023 -[facility] -Hold semaglutide  - Smaller dose of Lantus    Hypertension BPs are currently soft Hold off home oral antihypertensives Closely monitor vital signs Maintain MAP greater than 65 in the setting of sepsis.   Hyperlipidemia Resume home statin   Obesity Estimated body mass index is 39.55 kg/m as calculated from the following:   Height as of this encounter: 5' 8 (1.727 m).   Weight as of this encounter: 118 kg.    Moderate hepatic steatosis.  Recommend weight loss   Small fat-containing umbilical hernia and moderate fat-containing left inguinal hernia. Recommend outpatient follow-up   Generalized weakness PT OT evaluation Fall precautions.  Hypomagnesemia - Replete    DVT prophylaxis:     Code Status: Full Code   Disposition Plan:  Level of care: Telemetry Status is: Inpatient     Consultants:  none   Subjective: Overall tolerating diet with increased pain control Did not sleep well last night  Objective: Vitals:   04/13/24 0116 04/13/24 0559 04/13/24 0843 04/13/24 1100  BP: (!) 122/59 128/71 123/65 122/64  Pulse: 98 91 88   Resp: 16 18 18 18   Temp: 100.3 F (37.9 C) 99.2 F (37.3 C) 97.7 F (36.5 C) 99.1 F (37.3 C)  TempSrc: Oral Oral Oral Oral  SpO2: 92% 94% 94% 90%  Weight:      Height:        Intake/Output Summary (Last 24 hours) at 04/13/2024 1251 Last data filed at 04/13/2024 0300 Gross per 24 hour  Intake --  Output 450 ml  Net -450 ml   Filed Weights   04/12/24 1646 04/12/24 2142  Weight: 113.4 kg 118 kg    Examination:  General: Appearance:    Obese male in no acute distress     Lungs:     respirations unlabored  Heart:    Normal heart rate.    MS:   All extremities are intact.    Neurologic:   Awake, alert       Data Reviewed: I have personally reviewed following labs and imaging studies  CBC: Recent Labs  Lab 04/12/24 1659 04/13/24 0242  WBC 19.3* 15.9*  NEUTROABS 15.4*  --   HGB 11.5* 10.9*   HCT 37.6* 35.3*  MCV 90.0 89.6  PLT 260 231   Basic Metabolic Panel: Recent Labs  Lab 04/12/24 1659 04/13/24 0242  NA 138 137  K 4.6 4.2  CL 99 100  CO2 22 23  GLUCOSE 137* 180*  BUN 32* 31*  CREATININE 1.78* 1.42*  CALCIUM  8.9 8.6*  MG  --  1.6*  PHOS  --  2.9   GFR: Estimated Creatinine Clearance: 60.4 mL/min (A) (by C-G formula based on SCr of 1.42 mg/dL (H)). Liver Function Tests: Recent Labs  Lab 04/12/24 1659  AST 28  ALT 25  ALKPHOS 66  BILITOT 1.0  PROT 7.2  ALBUMIN  3.8   No results for input(s): LIPASE, AMYLASE in the last 168 hours. No results for input(s): AMMONIA in the last 168 hours. Coagulation Profile: Recent Labs  Lab 04/12/24 1659  INR 1.2   Cardiac Enzymes: No results for input(s): CKTOTAL, CKMB, CKMBINDEX, TROPONINI in the last 168 hours. BNP (last 3 results) No results for input(s): PROBNP in the last 8760 hours. HbA1C: Recent Labs    04/12/24 2119  HGBA1C 7.2*   CBG: Recent Labs  Lab 04/12/24 1642 04/12/24 2145 04/13/24 0725 04/13/24 1104  GLUCAP 144* 146* 145* 236*   Lipid Profile: No results for input(s): CHOL, HDL, LDLCALC, TRIG, CHOLHDL, LDLDIRECT in the last 72 hours. Thyroid Function Tests: No results for input(s): TSH, T4TOTAL, FREET4, T3FREE, THYROIDAB in the last 72 hours. Anemia Panel: No results for input(s): VITAMINB12, FOLATE, FERRITIN, TIBC, IRON, RETICCTPCT in the last 72 hours. Sepsis Labs: Recent Labs  Lab 04/12/24 1659 04/12/24 1908 04/12/24 2113  LATICACIDVEN 3.8* 2.2* 1.9    Recent Results (from the past 240 hours)  Blood Culture (routine x 2)     Status: None (Preliminary result)   Collection Time: 04/12/24  4:59 PM   Specimen: BLOOD  Result Value Ref Range Status   Specimen Description BLOOD BLOOD RIGHT HAND  Final   Special Requests   Final    BOTTLES DRAWN AEROBIC AND ANAEROBIC Blood Culture results may not be optimal due to an inadequate  volume of blood received in culture bottles   Culture   Final    NO GROWTH < 24 HOURS Performed at [facility], [address], [place], [place] [address]    Report Status PENDING  Incomplete  Urine Culture     Status: None (Preliminary result)   Collection Time: 04/12/24  5:07 PM   Specimen: Urine, Clean Catch  Result Value Ref Range Status   Specimen Description   Final    URINE, CLEAN CATCH Performed at [facility], [address], [place], [place] [address]    Special Requests   Final    NONE Reflexed from [id number] Performed at [facility], [address], [place], [place] [address]    Culture PENDING  Incomplete   Report Status PENDING  Incomplete  Resp panel by RT-PCR (RSV, Flu A&B, Covid) Anterior Nasal Swab  Status: None   Collection Time: 04/12/24  5:11 PM   Specimen: Anterior Nasal Swab  Result Value Ref Range Status   SARS Coronavirus 2 by RT PCR NEGATIVE NEGATIVE Final    Comment: (NOTE) SARS-CoV-2 target nucleic acids are NOT DETECTED.  The SARS-CoV-2 RNA is generally detectable in upper respiratory specimens during the acute phase of infection. The lowest concentration of SARS-CoV-2 viral copies this assay can detect is 138 copies/mL. A negative result does not preclude SARS-Cov-2 infection and should not be used as the sole basis for treatment or other patient management decisions. A negative result may occur with  improper specimen collection/handling, submission of specimen other than nasopharyngeal swab, presence of viral mutation(s) within the areas targeted by this assay, and inadequate number of viral copies(<138 copies/mL). A negative result must be combined with clinical observations, patient history, and epidemiological information. The expected result is Negative.  Fact Sheet for Patients:  bloggercourse.com  Fact Sheet for Healthcare Providers:  seriousbroker.it  This test is no t yet  approved or cleared by the [place]  FDA and  has been authorized for detection and/or diagnosis of SARS-CoV-2 by FDA under an Emergency Use Authorization (EUA). This EUA will remain  in effect (meaning this test can be used) for the duration of the COVID-19 declaration under Section 564(b)(1) of the Act, 21 U.S.C.section 360bbb-3(b)(1), unless the authorization is terminated  or revoked sooner.       Influenza A by PCR NEGATIVE NEGATIVE Final   Influenza B by PCR NEGATIVE NEGATIVE Final    Comment: (NOTE) The Xpert Xpress SARS-CoV-2/FLU/RSV plus assay is intended as an aid in the diagnosis of influenza from Nasopharyngeal swab specimens and should not be used as a sole basis for treatment. Nasal washings and aspirates are unacceptable for Xpert Xpress SARS-CoV-2/FLU/RSV testing.  Fact Sheet for Patients: bloggercourse.com  Fact Sheet for Healthcare Providers: seriousbroker.it  This test is not yet approved or cleared by the [place]  FDA and has been authorized for detection and/or diagnosis of SARS-CoV-2 by FDA under an Emergency Use Authorization (EUA). This EUA will remain in effect (meaning this test can be used) for the duration of the COVID-19 declaration under Section 564(b)(1) of the Act, 21 U.S.C. section 360bbb-3(b)(1), unless the authorization is terminated or revoked.     Resp Syncytial Virus by PCR NEGATIVE NEGATIVE Final    Comment: (NOTE) Fact Sheet for Patients: bloggercourse.com  Fact Sheet for Healthcare Providers: seriousbroker.it  This test is not yet approved or cleared by the [place]  FDA and has been authorized for detection and/or diagnosis of SARS-CoV-2 by FDA under an Emergency Use Authorization (EUA). This EUA will remain in effect (meaning this test can be used) for the duration of the COVID-19 declaration under Section 564(b)(1) of  the Act, 21 U.S.C. section 360bbb-3(b)(1), unless the authorization is terminated or revoked.  Performed at [facility], [address], [place], [place] [address]   Blood Culture (routine x 2)     Status: None (Preliminary result)   Collection Time: 04/12/24  5:11 PM   Specimen: BLOOD  Result Value Ref Range Status   Specimen Description BLOOD BLOOD RIGHT FOREARM  Final   Special Requests   Final    BOTTLES DRAWN AEROBIC AND ANAEROBIC Blood Culture adequate volume   Culture   Final    NO GROWTH < 24 HOURS Performed at [facility], [address] [place], [place] [address]    Report Status PENDING  Incomplete  Radiology Studies: CT ABDOMEN PELVIS W CONTRAST Result Date: 04/12/2024 EXAM: CT ABDOMEN AND PELVIS WITH CONTRAST 04/12/2024 06:32:52 PM TECHNIQUE: CT of the abdomen and pelvis was performed with the administration of 80 mL of iohexol  (OMNIPAQUE ) 300 MG/ML solution. Multiplanar reformatted images are provided for review. Automated exposure control, iterative reconstruction, and/or weight-based adjustment of the mA/kV was utilized to reduce the radiation dose to as low as reasonably achievable. COMPARISON: 03/20/2024 CLINICAL HISTORY: Abdominal pain, acute, nonlocalized. Sepsis. Fatigue, anorexia, cough. FINDINGS: LOWER CHEST: No acute abnormality. LIVER: Moderate hepatic steatosis. No intrahepatic mass. No intrahepatic biliary ductal dilation. GALLBLADDER AND BILE DUCTS: Status post cholecystectomy. No biliary ductal dilatation. SPLEEN: No acute abnormality. PANCREAS: No acute abnormality. ADRENAL GLANDS: No acute abnormality. KIDNEYS, URETERS AND BLADDER: Simple cortical cyst within the interpolar region of the right kidney for which no follow up imaging is recommended. No stones in the kidneys or ureters. No hydronephrosis. No perinephric or periureteral stranding. The bladder is decompressed. There is, however, circumferential bladder wall thickening and moderate  perivesicular inflammatory stranding suggesting a superimposed infectious or inflammatory cystitis. Correlation with urinalysis and urine culture may be helpful for further management. Moderate prostatic hypertrophy with superimposed periprosthetic inflammatory stranding which involves the seminal vesicles as well. Together, and with the findings involving the bladder, the findings may reflect changes of infectious cystoprostatitis. GI AND BOWEL: Appendix normal. The stomach, small bowel, and large bowel are otherwise unremarkable. There is no bowel obstruction. PERITONEUM AND RETROPERITONEUM: No ascites. No free air. VASCULATURE: Aorta is normal in caliber. There is extensive aortoiliac atherosclerotic calcification. Particularly prominent atherosclerotic calcification is seen at the origin of the mesenteric vasculature, though the degree of stenosis is not well assessed on this non-arteriographic study. Clinical correlation for signs and symptoms of chronic mesenteric ischemia are recommended. If present, CT arteriography would be helpful for further evaluation. LYMPH NODES: No lymphadenopathy. REPRODUCTIVE ORGANS: No acute abnormality. BONES AND SOFT TISSUES: Small fat-containing umbilical hernia. Mild subcutaneous body wall edema. Moderate fat-containing left inguinal hernia. Osseous structures are age appropriate. No acute bone abnormality. No lytic or blastic bone lesion. IMPRESSION: 1. Circumferential bladder wall thickening and moderate perivesicular inflammatory stranding, suggestive of infectious or inflammatory cystitis; urinalysis and urine culture may be helpful for further management. 2. Moderate prostatic hypertrophy with superimposed periprostatic and perivesicular inflammatory stranding, suggestive of infectious cystoprostatitis. 3. Prominent atherosclerotic calcification at the origin of the mesenteric vasculature with stenosis not well assessed on this non-arteriographic study; assess for symptoms  of chronic mesenteric ischemia, and if present CT arteriography would be helpful for further evaluation. 4. Moderate hepatic steatosis. 5. Small fat-containing umbilical hernia and moderate fat-containing left inguinal hernia. 6. RAF score includes Aortic atherosclerosis (ICD10-I70.0). Electronically signed by: Dorethia Molt MD 04/12/2024 06:47 PM EST RP Workstation: [id number]   DG Chest Port 1 View Result Date: 04/12/2024 CLINICAL DATA:  Questionable sepsis. EXAM: PORTABLE CHEST 1 VIEW COMPARISON:  Chest radiograph dated 07/14/2023. FINDINGS: Minimal left lung base atelectasis. No focal consolidation, pleural effusion, pneumothorax. Stable cardiac silhouette. Median sternotomy wires. No acute osseous pathology. IMPRESSION: No active disease. Electronically Signed   By: Vanetta Chou M.D.   On: 04/12/2024 17:29        Scheduled Meds:  aspirin  EC  81 mg Oral Daily   atorvastatin   80 mg Oral Daily   enoxaparin  (LOVENOX ) injection  55 mg Subcutaneous Q24H   insulin  aspart  0-5 Units Subcutaneous QHS   insulin  aspart  0-9 Units Subcutaneous TID WC   prasugrel   10 mg Oral Daily   saccharomyces boulardii  250 mg Oral BID   tamsulosin   0.4 mg Oral BID   Continuous Infusions:  lactated ringers  150 mL/hr (04/13/24 0300)   meropenem  (MERREM ) IV       LOS: 1 day    Time spent: 45 minutes spent on chart review, discussion with nursing staff, consultants, updating family and interview/physical exam; more than 50% of that time was spent in counseling and/or coordination of care.    Harlene RAYMOND Bowl, DO [facility] Available via Epic secure chat 7am-7pm After these hours, please refer to coverage provider listed on [email or web address] 04/13/2024, 12:51 PM   "

## 2024-04-14 ENCOUNTER — Ambulatory Visit: Admitting: Nurse Practitioner

## 2024-04-14 ENCOUNTER — Other Ambulatory Visit: Payer: Self-pay | Admitting: Nurse Practitioner

## 2024-04-14 DIAGNOSIS — I10 Essential (primary) hypertension: Secondary | ICD-10-CM

## 2024-04-14 DIAGNOSIS — A419 Sepsis, unspecified organism: Secondary | ICD-10-CM | POA: Diagnosis not present

## 2024-04-14 DIAGNOSIS — E118 Type 2 diabetes mellitus with unspecified complications: Secondary | ICD-10-CM

## 2024-04-14 DIAGNOSIS — Z794 Long term (current) use of insulin: Secondary | ICD-10-CM

## 2024-04-14 DIAGNOSIS — N390 Urinary tract infection, site not specified: Secondary | ICD-10-CM | POA: Diagnosis not present

## 2024-04-14 LAB — BASIC METABOLIC PANEL WITH GFR
Anion gap: 13 (ref 5–15)
BUN: 25 mg/dL — ABNORMAL HIGH (ref 8–23)
CO2: 28 mmol/L (ref 22–32)
Calcium: 9 mg/dL (ref 8.9–10.3)
Chloride: 99 mmol/L (ref 98–111)
Creatinine, Ser: 1 mg/dL (ref 0.61–1.24)
GFR, Estimated: >60 mL/min
Glucose, Bld: 180 mg/dL — ABNORMAL HIGH (ref 70–99)
Potassium: 4.3 mmol/L (ref 3.5–5.1)
Sodium: 139 mmol/L (ref 135–145)

## 2024-04-14 LAB — CBC
HCT: 37.9 % — ABNORMAL LOW (ref 39.0–52.0)
Hemoglobin: 11.6 g/dL — ABNORMAL LOW (ref 13.0–17.0)
MCH: 27.2 pg (ref 26.0–34.0)
MCHC: 30.6 g/dL (ref 30.0–36.0)
MCV: 89 fL (ref 80.0–100.0)
Platelets: 231 K/uL (ref 150–400)
RBC: 4.26 MIL/uL (ref 4.22–5.81)
RDW: 16.4 % — ABNORMAL HIGH (ref 11.5–15.5)
WBC: 11.8 K/uL — ABNORMAL HIGH (ref 4.0–10.5)
nRBC: 0 % (ref 0.0–0.2)

## 2024-04-14 LAB — GLUCOSE, CAPILLARY
Glucose-Capillary: 195 mg/dL — ABNORMAL HIGH (ref 70–99)
Glucose-Capillary: 202 mg/dL — ABNORMAL HIGH (ref 70–99)

## 2024-04-14 LAB — URINE CULTURE: Culture: >=100000 — AB

## 2024-04-14 MED ORDER — SULFAMETHOXAZOLE-TRIMETHOPRIM 800-160 MG PO TABS
1.0000 | ORAL_TABLET | Freq: Two times a day (BID) | ORAL | Status: DC
  Administered 2024-04-14: 1 via ORAL
  Filled 2024-04-14: qty 1

## 2024-04-14 MED ORDER — TRAZODONE HCL 50 MG PO TABS
50.0000 mg | ORAL_TABLET | Freq: Once | ORAL | Status: AC
  Administered 2024-04-14: 50 mg via ORAL
  Filled 2024-04-14: qty 1

## 2024-04-14 MED ORDER — ISOSORBIDE MONONITRATE ER 30 MG PO TB24
30.0000 mg | ORAL_TABLET | Freq: Two times a day (BID) | ORAL | Status: DC
  Administered 2024-04-14: 30 mg via ORAL
  Filled 2024-04-14: qty 1

## 2024-04-14 MED ORDER — SACCHAROMYCES BOULARDII 250 MG PO CAPS: 250.0000 mg | ORAL_CAPSULE | Freq: Two times a day (BID) | ORAL | Status: AC

## 2024-04-14 MED ORDER — OXYCODONE HCL 5 MG PO TABS: 2.5000 mg | ORAL_TABLET | ORAL | 0 refills | Status: AC | PRN

## 2024-04-14 MED ORDER — PHENAZOPYRIDINE HCL 100 MG PO TABS
100.0000 mg | ORAL_TABLET | Freq: Three times a day (TID) | ORAL | Status: DC
  Administered 2024-04-14: 100 mg via ORAL
  Filled 2024-04-14: qty 1

## 2024-04-14 MED ORDER — LISINOPRIL 5 MG PO TABS
5.0000 mg | ORAL_TABLET | Freq: Every day | ORAL | Status: DC
  Administered 2024-04-14: 5 mg via ORAL
  Filled 2024-04-14: qty 1

## 2024-04-14 MED ORDER — PHENAZOPYRIDINE HCL 100 MG PO TABS: 100.0000 mg | ORAL_TABLET | Freq: Three times a day (TID) | ORAL | 0 refills | Status: AC | PRN

## 2024-04-14 MED ORDER — PHENAZOPYRIDINE HCL 100 MG PO TABS: 100.0000 mg | ORAL_TABLET | Freq: Three times a day (TID) | ORAL | Status: DC

## 2024-04-14 MED ORDER — SULFAMETHOXAZOLE-TRIMETHOPRIM 800-160 MG PO TABS: 1.0000 | ORAL_TABLET | Freq: Two times a day (BID) | ORAL | 0 refills | Status: AC

## 2024-04-14 NOTE — Discharge Summary (Signed)
 "     Physician Discharge Summary  [name] [name] FMW:[id number] DOB: 1955/07/15 DOA: 04/12/2024  PCP: Gladis Mustard, FNP  Admit date: 04/12/2024 Discharge date: 04/14/2024  Admitted From:  Discharge disposition: home   Recommendations for Outpatient Follow-Up:   Close follow up with urology 4-6 weeks of abx unless plan changed by urology BMP 1 week   Discharge Diagnosis:   Principal Problem:   Sepsis (HCC)    Discharge Condition: Improved.  Diet recommendation: Low sodium, heart healthy  Wound care: None.  Code status: Full.   History of Present Illness:   [name] [name] is a 69 y.o. male with medical history significant for coronary artery disease status post PCI with stenting in 2005, status post CABG in 2022, hypertension, type 2 diabetes, hyperlipidemia, obesity, BPH, who presents to the ED with complaints of dysuria, lower back pain, nausea, and vomiting for the past 2 days.  Associated with poor oral intake.  Today his symptoms worsened.  The pain from the dysuria was unbearable.  Denies any postprandial abdominal pain.  Denies unintentional weight loss.  He presented to the ER for further evaluation.   In the ER, febrile with Tmax 102.5.  Hypotensive with SBP of 92, tachycardic 105, tachypneic 26.  Lab studies notable for lactic acidosis 3.8, leukocytosis 19.  UA positive for pyuria.  CT abdomen and pelvis with contrast revealed circumferential bladder wall thickening and moderate perivesicular inflammatory stranding suggestive of infectious or inflammatory cystitis.  Moderate prostatic hypertrophy with superimposed periprostatic and perivesicular inflammatory stranding suggestive of infectious cystoprostatitis.   Code sepsis was called in the ER.  Peripheral blood cultures x 2 and urine culture were collected.  The patient received IV vancomycin , IV Flagyl  and cefepime .  [facility], hospitalist service, was asked to admit.   Incidental finding on CT scan: Prominent  atherosclerotic calcification at the origin of the mesenteric vasculature with stenosis not well assessed on this arteriographic study.  Assess for symptoms of chronic mesenteric ischemia, and if present CT arteriography will be helpful for further evaluation.  Moderate hepatic steatosis.  Small fat-containing umbilical hernia and moderate fat-containing left inguinal hernia.     Hospital Course by Problem:   Sepsis secondary to cystoprostatitis, seen on CT scan, POA. Cystoprostatitis seen on CT scan. Previous history of ESBL E. coli UTI in 2023 Received 1 dose of cefepime , IV vancomycin , and IV Flagyl  in the ER. Switched to Merrem -- cultures with klebsiella-- discussed with pharmacy and plan to change to bactrim  -patient to see Dr. Devere next week -likely will need 4 to 6 weeks of antibiotics for prostatitis.  Florastor 250 mg twice daily probiotics added.     AKI, prerenal in the setting of dehydration from poor oral intake At baseline creatinine is 0.7 with GFR greater than 60 Presented with creatinine of 1.78 with GFR 41. -resolved   Coronary artery disease status post PCI with stenting and CABG Denies any anginal symptoms. Resume home regimen, aspirin , prasugrel , Lipitor .   BPH Resume home Flomax  Monitor urine output   Type 2 diabetes with hyperglycemia Last hemoglobin A1c 7.3 on 07/14/2023 -resume home meds   Hypertension -resume home meds   Hyperlipidemia Resume home statin   Obesity Estimated body mass index is 39.55 kg/m as calculated from the following:   Height as of this encounter: 5' 8 (1.727 m).   Weight as of this encounter: 118 kg.    Moderate hepatic steatosis.  Recommend weight loss   Small fat-containing umbilical hernia and moderate  fat-containing left inguinal hernia. Recommend outpatient follow-up   Generalized weakness PT OT evaluation Fall precautions.   Hypomagnesemia - Repleted    Medical Consultants:      Discharge Exam:    Vitals:   04/14/24 0519 04/14/24 0929  BP: (!) 156/88 134/84  Pulse: 93 84  Resp: 17 18  Temp: 98.1 F (36.7 C) 98.6 F (37 C)  SpO2: 91% 92%   Vitals:   04/13/24 1547 04/13/24 1900 04/14/24 0519 04/14/24 0929  BP: (!) 107/51 (!) 144/81 (!) 156/88 134/84  Pulse: 85 94 93 84  Resp: 18 18 17 18   Temp: 97.6 F (36.4 C) 98.3 F (36.8 C) 98.1 F (36.7 C) 98.6 F (37 C)  TempSrc: Oral Oral Oral Oral  SpO2: 92% 93% 91% 92%  Weight:      Height:        General exam: Appears calm and comfortable.    The results of significant diagnostics from this hospitalization (including imaging, microbiology, ancillary and laboratory) are listed below for reference.     Procedures and Diagnostic Studies:   CT ABDOMEN PELVIS W CONTRAST Result Date: 04/12/2024 EXAM: CT ABDOMEN AND PELVIS WITH CONTRAST 04/12/2024 06:32:52 PM TECHNIQUE: CT of the abdomen and pelvis was performed with the administration of 80 mL of iohexol  (OMNIPAQUE ) 300 MG/ML solution. Multiplanar reformatted images are provided for review. Automated exposure control, iterative reconstruction, and/or weight-based adjustment of the mA/kV was utilized to reduce the radiation dose to as low as reasonably achievable. COMPARISON: 03/20/2024 CLINICAL HISTORY: Abdominal pain, acute, nonlocalized. Sepsis. Fatigue, anorexia, cough. FINDINGS: LOWER CHEST: No acute abnormality. LIVER: Moderate hepatic steatosis. No intrahepatic mass. No intrahepatic biliary ductal dilation. GALLBLADDER AND BILE DUCTS: Status post cholecystectomy. No biliary ductal dilatation. SPLEEN: No acute abnormality. PANCREAS: No acute abnormality. ADRENAL GLANDS: No acute abnormality. KIDNEYS, URETERS AND BLADDER: Simple cortical cyst within the interpolar region of the right kidney for which no follow up imaging is recommended. No stones in the kidneys or ureters. No hydronephrosis. No perinephric or periureteral stranding. The bladder is decompressed. There is, however,  circumferential bladder wall thickening and moderate perivesicular inflammatory stranding suggesting a superimposed infectious or inflammatory cystitis. Correlation with urinalysis and urine culture may be helpful for further management. Moderate prostatic hypertrophy with superimposed periprosthetic inflammatory stranding which involves the seminal vesicles as well. Together, and with the findings involving the bladder, the findings may reflect changes of infectious cystoprostatitis. GI AND BOWEL: Appendix normal. The stomach, small bowel, and large bowel are otherwise unremarkable. There is no bowel obstruction. PERITONEUM AND RETROPERITONEUM: No ascites. No free air. VASCULATURE: Aorta is normal in caliber. There is extensive aortoiliac atherosclerotic calcification. Particularly prominent atherosclerotic calcification is seen at the origin of the mesenteric vasculature, though the degree of stenosis is not well assessed on this non-arteriographic study. Clinical correlation for signs and symptoms of chronic mesenteric ischemia are recommended. If present, CT arteriography would be helpful for further evaluation. LYMPH NODES: No lymphadenopathy. REPRODUCTIVE ORGANS: No acute abnormality. BONES AND SOFT TISSUES: Small fat-containing umbilical hernia. Mild subcutaneous body wall edema. Moderate fat-containing left inguinal hernia. Osseous structures are age appropriate. No acute bone abnormality. No lytic or blastic bone lesion. IMPRESSION: 1. Circumferential bladder wall thickening and moderate perivesicular inflammatory stranding, suggestive of infectious or inflammatory cystitis; urinalysis and urine culture may be helpful for further management. 2. Moderate prostatic hypertrophy with superimposed periprostatic and perivesicular inflammatory stranding, suggestive of infectious cystoprostatitis. 3. Prominent atherosclerotic calcification at the origin of the mesenteric vasculature with stenosis  not well assessed  on this non-arteriographic study; assess for symptoms of chronic mesenteric ischemia, and if present CT arteriography would be helpful for further evaluation. 4. Moderate hepatic steatosis. 5. Small fat-containing umbilical hernia and moderate fat-containing left inguinal hernia. 6. RAF score includes Aortic atherosclerosis (ICD10-I70.0). Electronically signed by: Dorethia Molt MD 04/12/2024 06:47 PM EST RP Workstation: [id number]   DG Chest Port 1 View Result Date: 04/12/2024 CLINICAL DATA:  Questionable sepsis. EXAM: PORTABLE CHEST 1 VIEW COMPARISON:  Chest radiograph dated 07/14/2023. FINDINGS: Minimal left lung base atelectasis. No focal consolidation, pleural effusion, pneumothorax. Stable cardiac silhouette. Median sternotomy wires. No acute osseous pathology. IMPRESSION: No active disease. Electronically Signed   By: Vanetta Chou M.D.   On: 04/12/2024 17:29     Labs:   Basic Metabolic Panel: Recent Labs  Lab 04/12/24 1659 04/13/24 0242 04/14/24 0421  NA 138 137 139  K 4.6 4.2 4.3  CL 99 100 99  CO2 22 23 28   GLUCOSE 137* 180* 180*  BUN 32* 31* 25*  CREATININE 1.78* 1.42* 1.00  CALCIUM  8.9 8.6* 9.0  MG  --  1.6*  --   PHOS  --  2.9  --    GFR Estimated Creatinine Clearance: 85.8 mL/min (by C-G formula based on SCr of 1 mg/dL). Liver Function Tests: Recent Labs  Lab 04/12/24 1659  AST 28  ALT 25  ALKPHOS 66  BILITOT 1.0  PROT 7.2  ALBUMIN  3.8   No results for input(s): LIPASE, AMYLASE in the last 168 hours. No results for input(s): AMMONIA in the last 168 hours. Coagulation profile Recent Labs  Lab 04/12/24 1659  INR 1.2    CBC: Recent Labs  Lab 04/12/24 1659 04/13/24 0242 04/14/24 0421  WBC 19.3* 15.9* 11.8*  NEUTROABS 15.4*  --   --   HGB 11.5* 10.9* 11.6*  HCT 37.6* 35.3* 37.9*  MCV 90.0 89.6 89.0  PLT 260 231 231   Cardiac Enzymes: No results for input(s): CKTOTAL, CKMB, CKMBINDEX, TROPONINI in the last 168 hours. BNP: Invalid  input(s): POCBNP CBG: Recent Labs  Lab 04/13/24 1104 04/13/24 1634 04/13/24 2002 04/14/24 0734 04/14/24 1113  GLUCAP 236* 189* 225* 195* 202*   D-Dimer No results for input(s): DDIMER in the last 72 hours. Hgb A1c Recent Labs    04/12/24 2119  HGBA1C 7.2*   Lipid Profile No results for input(s): CHOL, HDL, LDLCALC, TRIG, CHOLHDL, LDLDIRECT in the last 72 hours. Thyroid function studies No results for input(s): TSH, T4TOTAL, T3FREE, THYROIDAB in the last 72 hours.  Invalid input(s): FREET3 Anemia work up No results for input(s): VITAMINB12, FOLATE, FERRITIN, TIBC, IRON, RETICCTPCT in the last 72 hours. Microbiology Recent Results (from the past 240 hours)  Blood Culture (routine x 2)     Status: None (Preliminary result)   Collection Time: 04/12/24  4:59 PM   Specimen: BLOOD  Result Value Ref Range Status   Specimen Description BLOOD BLOOD RIGHT HAND  Final   Special Requests   Final    BOTTLES DRAWN AEROBIC AND ANAEROBIC Blood Culture results may not be optimal due to an inadequate volume of blood received in culture bottles   Culture   Final    NO GROWTH 2 DAYS Performed at [facility], [address] [place], [place] [address]    Report Status PENDING  Incomplete  Urine Culture     Status: Abnormal   Collection Time: 04/12/24  5:07 PM   Specimen: Urine, Clean Catch  Result Value Ref Range  Status   Specimen Description   Final    URINE, CLEAN CATCH Performed at [facility], [address], [place], [place] [address]    Special Requests   Final    NONE Reflexed from [id number] Performed at [facility], [address], [place], [place] [address]    Culture >=100,000 COLONIES/mL KLEBSIELLA PNEUMONIAE (A)  Final   Report Status 04/14/2024 FINAL  Final   Organism ID, Bacteria KLEBSIELLA PNEUMONIAE (A)  Final      Susceptibility   Klebsiella pneumoniae - MIC*    AMPICILLIN RESISTANT Resistant     CEFAZOLIN  (URINE) Value  in next row Sensitive      2 SENSITIVEThis is a modified FDA-approved test that has been validated and its performance characteristics determined by the reporting laboratory.  This laboratory is certified under the Clinical Laboratory Improvement Amendments CLIA as qualified to perform high complexity clinical laboratory testing.    CEFEPIME  Value in next row Sensitive      2 SENSITIVEThis is a modified FDA-approved test that has been validated and its performance characteristics determined by the reporting laboratory.  This laboratory is certified under the Clinical Laboratory Improvement Amendments CLIA as qualified to perform high complexity clinical laboratory testing.    ERTAPENEM Value in next row Sensitive      2 SENSITIVEThis is a modified FDA-approved test that has been validated and its performance characteristics determined by the reporting laboratory.  This laboratory is certified under the Clinical Laboratory Improvement Amendments CLIA as qualified to perform high complexity clinical laboratory testing.    CEFTRIAXONE  Value in next row Sensitive      2 SENSITIVEThis is a modified FDA-approved test that has been validated and its performance characteristics determined by the reporting laboratory.  This laboratory is certified under the Clinical Laboratory Improvement Amendments CLIA as qualified to perform high complexity clinical laboratory testing.    CIPROFLOXACIN  Value in next row Sensitive      2 SENSITIVEThis is a modified FDA-approved test that has been validated and its performance characteristics determined by the reporting laboratory.  This laboratory is certified under the Clinical Laboratory Improvement Amendments CLIA as qualified to perform high complexity clinical laboratory testing.    GENTAMICIN Value in next row Sensitive      2 SENSITIVEThis is a modified FDA-approved test that has been validated and its performance characteristics determined by the reporting laboratory.   This laboratory is certified under the Clinical Laboratory Improvement Amendments CLIA as qualified to perform high complexity clinical laboratory testing.    NITROFURANTOIN Value in next row Sensitive      2 SENSITIVEThis is a modified FDA-approved test that has been validated and its performance characteristics determined by the reporting laboratory.  This laboratory is certified under the Clinical Laboratory Improvement Amendments CLIA as qualified to perform high complexity clinical laboratory testing.    TRIMETH /SULFA  Value in next row Sensitive      2 SENSITIVEThis is a modified FDA-approved test that has been validated and its performance characteristics determined by the reporting laboratory.  This laboratory is certified under the Clinical Laboratory Improvement Amendments CLIA as qualified to perform high complexity clinical laboratory testing.    AMPICILLIN/SULBACTAM Value in next row Sensitive      2 SENSITIVEThis is a modified FDA-approved test that has been validated and its performance characteristics determined by the reporting laboratory.  This laboratory is certified under the Clinical Laboratory Improvement Amendments CLIA as qualified to perform high complexity clinical laboratory testing.  PIP/TAZO Value in next row Sensitive      <=4 SENSITIVEThis is a modified FDA-approved test that has been validated and its performance characteristics determined by the reporting laboratory.  This laboratory is certified under the Clinical Laboratory Improvement Amendments CLIA as qualified to perform high complexity clinical laboratory testing.    MEROPENEM  Value in next row Sensitive      <=4 SENSITIVEThis is a modified FDA-approved test that has been validated and its performance characteristics determined by the reporting laboratory.  This laboratory is certified under the Clinical Laboratory Improvement Amendments CLIA as qualified to perform high complexity clinical laboratory testing.    *  >=100,000 COLONIES/mL KLEBSIELLA PNEUMONIAE  Resp panel by RT-PCR (RSV, Flu A&B, Covid) Anterior Nasal Swab     Status: None   Collection Time: 04/12/24  5:11 PM   Specimen: Anterior Nasal Swab  Result Value Ref Range Status   SARS Coronavirus 2 by RT PCR NEGATIVE NEGATIVE Final    Comment: (NOTE) SARS-CoV-2 target nucleic acids are NOT DETECTED.  The SARS-CoV-2 RNA is generally detectable in upper respiratory specimens during the acute phase of infection. The lowest concentration of SARS-CoV-2 viral copies this assay can detect is 138 copies/mL. A negative result does not preclude SARS-Cov-2 infection and should not be used as the sole basis for treatment or other patient management decisions. A negative result may occur with  improper specimen collection/handling, submission of specimen other than nasopharyngeal swab, presence of viral mutation(s) within the areas targeted by this assay, and inadequate number of viral copies(<138 copies/mL). A negative result must be combined with clinical observations, patient history, and epidemiological information. The expected result is Negative.  Fact Sheet for Patients:  bloggercourse.com  Fact Sheet for Healthcare Providers:  seriousbroker.it  This test is no t yet approved or cleared by the [place]  FDA and  has been authorized for detection and/or diagnosis of SARS-CoV-2 by FDA under an Emergency Use Authorization (EUA). This EUA will remain  in effect (meaning this test can be used) for the duration of the COVID-19 declaration under Section 564(b)(1) of the Act, 21 U.S.C.section 360bbb-3(b)(1), unless the authorization is terminated  or revoked sooner.       Influenza A by PCR NEGATIVE NEGATIVE Final   Influenza B by PCR NEGATIVE NEGATIVE Final    Comment: (NOTE) The Xpert Xpress SARS-CoV-2/FLU/RSV plus assay is intended as an aid in the diagnosis of influenza from  Nasopharyngeal swab specimens and should not be used as a sole basis for treatment. Nasal washings and aspirates are unacceptable for Xpert Xpress SARS-CoV-2/FLU/RSV testing.  Fact Sheet for Patients: bloggercourse.com  Fact Sheet for Healthcare Providers: seriousbroker.it  This test is not yet approved or cleared by the [place]  FDA and has been authorized for detection and/or diagnosis of SARS-CoV-2 by FDA under an Emergency Use Authorization (EUA). This EUA will remain in effect (meaning this test can be used) for the duration of the COVID-19 declaration under Section 564(b)(1) of the Act, 21 U.S.C. section 360bbb-3(b)(1), unless the authorization is terminated or revoked.     Resp Syncytial Virus by PCR NEGATIVE NEGATIVE Final    Comment: (NOTE) Fact Sheet for Patients: bloggercourse.com  Fact Sheet for Healthcare Providers: seriousbroker.it  This test is not yet approved or cleared by the [place]  FDA and has been authorized for detection and/or diagnosis of SARS-CoV-2 by FDA under an Emergency Use Authorization (EUA). This EUA will remain in effect (meaning this test can be used) for the duration  of the COVID-19 declaration under Section 564(b)(1) of the Act, 21 U.S.C. section 360bbb-3(b)(1), unless the authorization is terminated or revoked.  Performed at [facility], [address], [place], [place] [address]   Blood Culture (routine x 2)     Status: None (Preliminary result)   Collection Time: 04/12/24  5:11 PM   Specimen: BLOOD  Result Value Ref Range Status   Specimen Description BLOOD BLOOD RIGHT FOREARM  Final   Special Requests   Final    BOTTLES DRAWN AEROBIC AND ANAEROBIC Blood Culture adequate volume   Culture   Final    NO GROWTH 2 DAYS Performed at [facility], [address], [place], [place] [address]    Report Status PENDING  Incomplete      Discharge Instructions:   Discharge Instructions     Diet - low sodium heart healthy   Complete by: As directed    Diet Carb Modified   Complete by: As directed    Discharge instructions   Complete by: As directed    Close outpatient follow up with Dr. Devere (urology)   Increase activity slowly   Complete by: As directed       Allergies as of 04/14/2024   No Known Allergies      Medication List     PAUSE taking these medications    furosemide  40 MG tablet Wait to take this until: April 17, 2024 Commonly known as: Lasix  Take 1 tablet (40 mg total) by mouth daily.       STOP taking these medications    amoxicillin -clavulanate 875-125 MG tablet Commonly known as: AUGMENTIN        TAKE these medications    Accu-Chek Guide Test test strip Generic drug: glucose blood Test BS 4-6x daily as needed Dx E11.8   Accu-Chek Softclix Lancets lancets Test BS 4-6X as needed daioly Dx E11.8   aspirin  EC 81 MG tablet Take 1 tablet (81 mg total) by mouth daily. Swallow whole.   atorvastatin  80 MG tablet Commonly known as: LIPITOR  Take 1 tablet (80 mg total) by mouth daily.   blood glucose meter kit and supplies Dispense based on patient and insurance preference. Use up to four times daily as directed. (FOR ICD-10 E10.9, E11.9).   Blood Glucose Monitoring Suppl Devi 1 each by Does not apply route in the morning, at noon, and at bedtime. May substitute to any manufacturer covered by patient's insurance.   carvedilol  6.25 MG tablet Commonly known as: COREG  Take 1 tablet (6.25 mg total) by mouth 2 (two) times daily.   CINNAMON  PO Take 1,000 mg by mouth 2 (two) times daily.   CRANBERRY PO Take 4,200 mg by mouth 2 (two) times daily.   Embecta Pen Needle Ultrafine 31G X 8 MM Misc Generic drug: Insulin  Pen Needle USE 1 PEN NEEDLE UP TO 8 TIMES DAILY AS NEEDED WITH INSULIN  Dx E11.8   isosorbide  mononitrate 30 MG 24 hr tablet Commonly known as: IMDUR  Take 1  tablet (30 mg total) by mouth in the morning and at bedtime.   Lantus  SoloStar 100 UNIT/ML Solostar Pen Generic drug: insulin  glargine 47u qam and 78u q evening What changed:  how to take this when to take this additional instructions   lisinopril  5 MG tablet Commonly known as: ZESTRIL  Take 1 tablet (5 mg total) by mouth daily. What changed: Another medication with the same name was removed. Continue taking this medication, and follow the directions you see here.   loratadine  10 MG tablet  Commonly known as: CLARITIN  Take 10 mg by mouth daily.   metFORMIN  500 MG tablet Commonly known as: GLUCOPHAGE  Take 2 tablets (1,000 mg total) by mouth 2 (two) times daily.   nitroGLYCERIN  0.4 MG SL tablet Commonly known as: NITROSTAT  Place 1 tablet (0.4 mg total) under the tongue every 5 (five) minutes x 3 doses as needed for chest pain (if no relief after 2nd dose, proceed to ED or call 911).   NovoLOG  FlexPen 100 UNIT/ML FlexPen Generic drug: insulin  aspart INJECT 48-78 UNITS SUBCUTANEOUSLY TWICE DAILY PER  SLIDING  SCALE What changed: additional instructions   ondansetron  4 MG tablet Commonly known as: Zofran  Take 1 tablet (4 mg total) by mouth every 8 (eight) hours as needed for nausea or vomiting.   oxyCODONE  5 MG immediate release tablet Commonly known as: Roxicodone  Take 0.5-1 tablets (2.5-5 mg total) by mouth every 4 (four) hours as needed for severe pain (pain score 7-10).   Ozempic  (2 MG/DOSE) 8 MG/3ML Sopn Generic drug: Semaglutide  (2 MG/DOSE) Inject 2 mg into the skin once a week.   pantoprazole  40 MG tablet Commonly known as: PROTONIX  Take 1 tablet (40 mg total) by mouth daily as needed (for acid reflux).   phenazopyridine  100 MG tablet Commonly known as: PYRIDIUM  Take 1 tablet (100 mg total) by mouth 3 (three) times daily as needed for pain (painful urination).   prasugrel  10 MG Tabs tablet Commonly known as: Effient  Take 1 tablet (10 mg total) by mouth daily.    saccharomyces boulardii 250 MG capsule Commonly known as: FLORASTOR Take 1 capsule (250 mg total) by mouth 2 (two) times daily.   sulfamethoxazole -trimethoprim  800-160 MG tablet Commonly known as: BACTRIM  DS Take 1 tablet by mouth every 12 (twelve) hours.   tamsulosin  0.4 MG Caps capsule Commonly known as: FLOMAX  Take 1 capsule (0.4 mg total) by mouth 2 (two) times daily.          Time coordinating discharge: 45 min  Signed:  Harlene RAYMOND Bowl DO  [facility] 04/14/2024, 12:00 PM      "

## 2024-04-14 NOTE — Progress Notes (Signed)
 Mobility Specialist Progress Note:    04/14/24 1010  Mobility  Activity Ambulated with assistance  Level of Assistance Independent  Assistive Device None  Distance Ambulated (ft) 210 ft  Range of Motion/Exercises Active;All extremities  Activity Response Tolerated well  Mobility Referral Yes  Mobility visit 1 Mobility  Mobility Specialist Start Time (ACUTE ONLY) 1010  Mobility Specialist Stop Time (ACUTE ONLY) 1030  Mobility Specialist Time Calculation (min) (ACUTE ONLY) 20 min   Pt received sitting EOB, agreeable to mobility. Independently able to stand and ambulate with no AD. Tolerated well, asx throughout. Returned sitting EOB, all needs met.   Django Nguyen Mobility Specialist Please contact via Special Educational Needs Teacher or  Rehab office at [phone]

## 2024-04-16 ENCOUNTER — Other Ambulatory Visit: Payer: Self-pay | Admitting: Cardiology

## 2024-04-17 ENCOUNTER — Telehealth: Payer: Self-pay | Admitting: *Deleted

## 2024-04-17 DIAGNOSIS — I10 Essential (primary) hypertension: Secondary | ICD-10-CM

## 2024-04-17 LAB — CULTURE, BLOOD (ROUTINE X 2)
Culture: NO GROWTH
Culture: NO GROWTH
Special Requests: ADEQUATE

## 2024-04-17 NOTE — Transitions of Care (Post Inpatient/ED Visit) (Signed)
 "  04/17/2024  Name: [name] [name] MRN: [id number] DOB: [birth year]  Today's TOC FU Call Status: Today's TOC FU Call Status:: Successful TOC FU Call Completed TOC FU Call Complete Date: 04/17/24  Patient's Name and Date of Birth confirmed. Name, DOB  Transition Care Management Follow-up Telephone Call Date of Discharge: 04/14/24 [facility] Type of Discharge: Inpatient Admission Primary Inpatient Discharge Diagnosis:: Sepsis How have you been since you were released from the hospital?: Same (per patient not so hot) Any questions or concerns?: Yes (Per patient very poor stream, having to wear adult briefs, constipation) Patient Questions/Concerns:: Rn discussed over the counter laxatives he has taken, warm fluids to help them work. F/U appt with urologist Patient Questions/Concerns Addressed: Other: (Patient has appointment today with urologist)  Items Reviewed: Did you receive and understand the discharge instructions provided?: Yes Medications obtained,verified, and reconciled?: Yes (Medications Reviewed) Any new allergies since your discharge?: No Dietary orders reviewed?: No Do you have support at home?: Yes People in Home [RPT]: spouse Name of Support/Comfort Primary Source: Anne  Medications Reviewed Today: Medications Reviewed Today     Reviewed by Kennieth Cathlean DEL, RN (Case Manager) on 04/17/24 at 1101  Med List Status: <None>   Medication Order Taking? Sig Documenting Provider Last Dose Status Informant  Accu-Chek Softclix Lancets lancets [id number] Yes Test BS 4-6X as needed daioly Dx E11.8 Gladis Mustard, FNP  Active Self, Pharmacy Records  aspirin  EC 81 MG tablet [id number] Yes Take 1 tablet (81 mg total) by mouth daily. Swallow whole. Debera Jayson MATSU, MD  Active Self, Pharmacy Records  atorvastatin  (LIPITOR ) 80 MG tablet [id number] Yes Take 1 tablet (80 mg total) by mouth daily. Gladis Mustard, FNP  Active Self, Pharmacy Records   blood glucose meter kit and supplies [id number] Yes Dispense based on patient and insurance preference. Use up to four times daily as directed. (FOR ICD-10 E10.9, E11.9). Gladis Mustard, FNP  Active Self, Pharmacy Records  Blood Glucose Monitoring Suppl DEVI [id number] Yes 1 each by Does not apply route in the morning, at noon, and at bedtime. May substitute to any manufacturer covered by patient's insurance. Gladis Mustard, FNP  Active Self, Pharmacy Records  carvedilol  (COREG ) 6.25 MG tablet [id number] Yes Take 1 tablet (6.25 mg total) by mouth 2 (two) times daily. Gladis Mustard, FNP  Active Self, Pharmacy Records  CINNAMON  PO [id number] Yes Take 1,000 mg by mouth 2 (two) times daily. [provider]  Active Self, Pharmacy Records  CRANBERRY PO [id number]  Take 4,200 mg by mouth 2 (two) times daily. Paused [provider]  Active Self, Pharmacy Records  furosemide  (LASIX ) 40 MG tablet [id number]  Take 1 tablet (40 mg total) by mouth daily. Gladis Mustard, FNP  Active Self, Pharmacy Records           Med Note LORICE, CATHLEAN DEL Kitchens Apr 17, 2024 11:00 AM) PAUSED  glucose blood (ACCU-CHEK GUIDE TEST) test strip [id number] Yes Test BS 4-6x daily as needed Dx E11.8 Gladis Mustard, FNP  Active Self, Pharmacy Records  insulin  glargine (LANTUS  SOLOSTAR) 100 UNIT/ML Solostar Pen [id number] Yes 47u qam and 78u q evening  Patient taking differently: Inject into the skin 2 (two) times daily. 48u qam and 78u q evening   Gladis Mustard, FNP  Active Self, Pharmacy Records  Insulin  Pen Needle (EMBECTA PEN NEEDLE ULTRAFINE) 31G X 8 MM MISC [id number] Yes USE 1 PEN NEEDLE UP TO 8 TIMES DAILY AS NEEDED WITH INSULIN  Dx E11.8 Gladis,  Mary-Margaret, FNP  Active Self, Pharmacy Records  isosorbide  mononitrate (IMDUR ) 30 MG 24 hr tablet [id number] Yes Take 1 tablet (30 mg total) by mouth in the morning and at bedtime. Gladis Mustard, FNP  Active Self, Pharmacy  Records  lisinopril  (ZESTRIL ) 5 MG tablet [id number] Yes Take 1 tablet (5 mg total) by mouth daily. Gladis Mustard, FNP  Active Self, Pharmacy Records  loratadine  (CLARITIN ) 10 MG tablet [id number] Yes Take 10 mg by mouth daily. [provider]  Active Self, Pharmacy Records  metFORMIN  (GLUCOPHAGE ) 500 MG tablet [id number] Yes Take 2 tablets (1,000 mg total) by mouth 2 (two) times daily. Gladis Mustard, FNP  Active Self, Pharmacy Records  nitroGLYCERIN  (NITROSTAT ) 0.4 MG SL tablet [id number] Yes Place 1 tablet (0.4 mg total) under the tongue every 5 (five) minutes x 3 doses as needed for chest pain (if no relief after 2nd dose, proceed to ED or call 911). Debera Jayson MATSU, MD  Active Self, Pharmacy Records  NOVOLOG  FLEXPEN 100 UNIT/ML FlexPen [id number] Yes INJECT 48-78 UNITS SUBCUTANEOUSLY TWICE DAILY PER  SLIDING  SCALE  Patient taking differently: INJECT 48-78 UNITS SUBCUTANEOUSLY 2-3 times DAILY PER  SLIDING  SCALE   Gladis, Mary-Margaret, FNP  Active Self, Pharmacy Records  ondansetron  (ZOFRAN ) 4 MG tablet [id number] Yes Take 1 tablet (4 mg total) by mouth every 8 (eight) hours as needed for nausea or vomiting. Harris, Abigail, PA-C  Active Self, Pharmacy Records  oxyCODONE  (ROXICODONE ) 5 MG immediate release tablet [id number] Yes Take 0.5-1 tablets (2.5-5 mg total) by mouth every 4 (four) hours as needed for severe pain (pain score 7-10). Vann, Jessica U, DO  Active   pantoprazole  (PROTONIX ) 40 MG tablet [id number] Yes Take 1 tablet (40 mg total) by mouth daily as needed (for acid reflux). Gladis Mustard, FNP  Active Self, Pharmacy Records  phenazopyridine  (PYRIDIUM ) 100 MG tablet [id number]  Take 1 tablet (100 mg total) by mouth 3 (three) times daily as needed for pain (painful urination). Vann, Jessica U, DO  Active   prasugrel  (EFFIENT ) 10 MG TABS tablet [id number] Yes Take 1 tablet (10 mg total) by mouth daily. Anner Alm ORN, MD  Active Self, Pharmacy Records   saccharomyces boulardii [facility]) 250 MG capsule [id number] Yes Take 1 capsule (250 mg total) by mouth 2 (two) times daily. Vann, Jessica U, DO  Active   Semaglutide , 2 MG/DOSE, (OZEMPIC , 2 MG/DOSE,) 8 MG/3ML SOPN [id number] Yes Inject 2 mg into the skin once a week. Gladis Mustard, FNP  Active Self, Pharmacy Records  sulfamethoxazole -trimethoprim  (BACTRIM  DS) 800-160 MG tablet [id number] Yes Take 1 tablet by mouth every 12 (twelve) hours. Vann, Jessica U, DO  Active   tamsulosin  (FLOMAX ) 0.4 MG CAPS capsule [id number] Yes Take 1 capsule (0.4 mg total) by mouth 2 (two) times daily. Arloa Chroman, PA-C  Active Self, Pharmacy Records            Home Care and Equipment/Supplies: Were Home Health Services Ordered?: NA Any new equipment or medical supplies ordered?: NA  Functional Questionnaire: Do you need assistance with bathing/showering or dressing?: No Do you need assistance with meal preparation?: Yes Do you need assistance with eating?: No Do you have difficulty maintaining continence: No Do you need assistance with getting out of bed/getting out of a chair/moving?: No Do you have difficulty managing or taking your medications?: No  Follow up appointments reviewed: PCP Follow-up appointment confirmed?: Yes Date of PCP follow-up appointment?: 04/25/24 Follow-up Provider: [facility] Follow-up appointment confirmed?: Yes Date of  Specialist follow-up appointment?: 04/17/24 Follow-Up Specialty Provider:: Urologist Do you need transportation to your follow-up appointment?: No Do you understand care options if your condition(s) worsen?: Yes-patient verbalized understanding  SDOH Interventions Today    Flowsheet Row Most Recent Value  SDOH Interventions   Food Insecurity Interventions Intervention Not Indicated  Housing Interventions Intervention Not Indicated  Transportation Interventions Intervention Not Indicated, Patient Resources (Friends/Family)   Utilities Interventions Intervention Not Indicated    Goals Addressed             This Visit's Progress    VBCI Transitions of Care (TOC) Care Plan       Problems:  Recent Hospitalization for treatment of Sepsis  Knowledge Deficit Related to Sepsis/UTI  Goal:  Over the next 30 days, the patient will not experience hospital readmission  Interventions:  Transitions of Care: Doctor Visits  - discussed the importance of doctor visits Referral to Longitudinal Nurse Case Manager for Ongoing follow-up  Patient Self Care Activities:  Attend all scheduled provider appointments Call pharmacy for medication refills 3-7 days in advance of running out of medications Call provider office for new concerns or questions  Notify RN Care Manager of TOC call rescheduling needs Participate in Transition of Care Program/Attend TOC scheduled calls Perform all self care activities independently  Take medications as prescribed   Complete all antibiotics as per ordered Maintain adequate hydration  Plan:  An initial telephone outreach has been scheduled for: 04/26/2024 Follow up with provider re: Poor urine stream, constipation  Next PCP appointment scheduled for: 04/25/2024 Telephone follow up appointment with care management team member scheduled for:04/26/2024 1:00 Richerd Fish Patient will keep Urologist appointment to day at 3:15.  If symptoms worsen patient will call urologist immediately        Discussed and offered 30 day TOC program.  Patient   consented.  The patient has been provided with contact information for the care management team and has been advised to call with any health -related questions or concerns.  The patient verbalized understanding with current plan of care.  The patient is directed to their insurance card regarding availability of benefits coverage  Patient is having problems urinating and has constipation. Appointment with urologist today:   Cathlean Headland  BSN RN [facility] Care Management Coordinator [email or web address] Direct Dial: [phone]  Fax: [phone] Website: [email or web address]  "

## 2024-04-19 NOTE — Telephone Encounter (Signed)
 Per protocol labs outside range allowed to refill. Hemoglobin 11.6 and HCT 37.9 on 04/14/24. Ok to refill? Thank you

## 2024-04-19 NOTE — Telephone Encounter (Signed)
 Labs on 04/14/24 Outside of Normal

## 2024-04-20 NOTE — Telephone Encounter (Signed)
 yes

## 2024-04-25 ENCOUNTER — Ambulatory Visit: Payer: Self-pay | Admitting: Nurse Practitioner

## 2024-04-26 ENCOUNTER — Other Ambulatory Visit: Payer: Self-pay

## 2024-04-26 ENCOUNTER — Telehealth: Payer: Self-pay

## 2024-04-26 NOTE — Transitions of Care (Post Inpatient/ED Visit) (Unsigned)
 " Transition of Care week 2  Visit Note  04/26/2024  Name: [name] [name] MRN: [id number]          DOB: 09-01-1955  Situation: Patient enrolled in [facility] 30-day program. Visit completed with patient by telephone.   Background:   Initial Transition Care Management Follow-up Telephone Call Discharge Date and Diagnosis: 04/14/24, Sepsis   Past Medical History:  Diagnosis Date   Anxiety    Coronary atherosclerosis of native coronary artery    DES distal circumflex 2005; DES LAD/diagonal bifurcation 09/2010; DES ostial LAD and DES left PL 01/2020; CABG May 2022 (LIMA to LAD, SVG to diagonal, SVG to OM1 and OM 3)   Essential hypertension    GERD (gastroesophageal reflux disease)    History of kidney stones    Mixed hyperlipidemia    Myocardial infarction (HCC)    Anterolateral with VF arrest 7/12   Type 2 diabetes mellitus (HCC)     Assessment: Patient Reported Symptoms: Cognitive Cognitive Status: Able to follow simple commands, Alert and oriented to person, place, and time, Insightful and able to interpret abstract concepts, Normal speech and language skills      Neurological Neurological Review of Symptoms: No symptoms reported Neurological Self-Management Outcome: 4 (good)  HEENT HEENT Symptoms Reported: No symptoms reported HEENT Self-Management Outcome: 4 (good)    Cardiovascular Cardiovascular Symptoms Reported: No symptoms reported Does patient have uncontrolled Hypertension?: Yes Is patient checking Blood Pressure at home?: Yes Patient's Recent BP reading at home: 123/59 Cardiovascular Management Strategies: Medication therapy Cardiovascular Self-Management Outcome: 4 (good)  Respiratory Respiratory Symptoms Reported: No symptoms reported    Endocrine Endocrine Symptoms Reported: No symptoms reported Is patient diabetic?: Yes Is patient checking blood sugars at home?: Yes List most recent blood sugar readings, include date and time of day: 85-95 range, today was 87,  adjusting my insulin  throughout the day, will follow up with PCP on 04/27/24 regarding insulin  adjustments Endocrine Self-Management Outcome: 4 (good)  Gastrointestinal Gastrointestinal Symptoms Reported: Constipation Additional Gastrointestinal Details: Taking Miralax  Gastrointestinal Management Strategies: Medication therapy Gastrointestinal Self-Management Outcome: 3 (uncertain)    Genitourinary Genitourinary Symptoms Reported: Difficulty initiating stream Genitourinary Management Strategies: Catheter, indwelling Indwelling Catheter Inserted: 04/17/24 Genitourinary Self-Management Outcome: 4 (good)  Integumentary Integumentary Symptoms Reported: No symptoms reported    Musculoskeletal Musculoskelatal Symptoms Reviewed: No symptoms reported Musculoskeletal Self-Management Outcome: 4 (good)      Psychosocial Psychosocial Symptoms Reported: No symptoms reported         Today's Vitals   04/26/24 1305  BP: (!) 123/59   Pain Scale: 0-10 Pain Score: 2   Medications Reviewed Today     Reviewed by [name] [name] GRADE, RN (Registered Nurse) on 04/26/24 at 1311  Med List Status: <None>   Medication Order Taking? Sig Documenting Provider Last Dose Status Informant  Accu-Chek Softclix Lancets lancets [id number] Yes Test BS 4-6X as needed daioly Dx E11.8 [name] [name]  Active Self, Pharmacy Records  aspirin  EC 81 MG tablet [id number] Yes Take 1 tablet (81 mg total) by mouth daily. Swallow whole. [name] Jayson MATSU, MD  Active Self, Pharmacy Records  atorvastatin  (LIPITOR ) 80 MG tablet [id number] Yes Take 1 tablet (80 mg total) by mouth daily. [name] [name]  Active Self, Pharmacy Records  blood glucose meter kit and supplies [id number] Yes Dispense based on patient and insurance preference. Use up to four times daily as directed. (FOR ICD-10 E10.9, E11.9). [name] [name]  Active Self, Pharmacy Records  Blood Glucose Monitoring Suppl DEVI [id number] Yes  1 each  by Does not apply route in the morning, at noon, and at bedtime. May substitute to any manufacturer covered by patient's insurance. [name] [name]  Active Self, Pharmacy Records  carvedilol  (COREG ) 6.25 MG tablet [id number] Yes Take 1 tablet (6.25 mg total) by mouth 2 (two) times daily. [name] [name]  Active Self, Pharmacy Records  CINNAMON  PO [id number] Yes Take 1,000 mg by mouth 2 (two) times daily. [provider]  Active Self, Pharmacy Records  CRANBERRY PO [id number] Yes Take 4,200 mg by mouth 2 (two) times daily. Paused [provider]  Active Self, Pharmacy Records  furosemide  (LASIX ) 40 MG tablet [id number] Yes Take 1 tablet (40 mg total) by mouth daily. [name] [name]  Active Self, Pharmacy Records           Med Note LORICE, CATHLEAN VEAR Kitchens Apr 17, 2024 11:00 AM) PAUSED  glucose blood (ACCU-CHEK GUIDE TEST) test strip [id number] Yes Test BS 4-6x daily as needed Dx E11.8 [name] [name]  Active Self, Pharmacy Records  insulin  glargine (LANTUS  SOLOSTAR) 100 UNIT/ML Solostar Pen [id number]  47u qam and 78u q evening  Patient taking differently: Inject into the skin 2 (two) times daily. 48u qam and 78u q evening   [name] [name]  Active Self, Pharmacy Records  Insulin  Pen Needle (EMBECTA PEN NEEDLE ULTRAFINE) 31G X 8 MM MISC [id number] Yes USE 1 PEN NEEDLE UP TO 8 TIMES DAILY AS NEEDED WITH INSULIN  Dx E11.8 [name] [name]  Active Self, Pharmacy Records  isosorbide  mononitrate (IMDUR ) 30 MG 24 hr tablet [id number] Yes Take 1 tablet (30 mg total) by mouth in the morning and at bedtime. [name] [name]  Active Self, Pharmacy Records  lisinopril  (ZESTRIL ) 5 MG tablet [id number] Yes Take 1 tablet (5 mg total) by mouth daily. [name] [name]  Active Self, Pharmacy Records  loratadine  (CLARITIN ) 10 MG tablet [id number] Yes Take 10 mg by mouth daily. [provider]  Active Self, Pharmacy  Records  metFORMIN  (GLUCOPHAGE ) 500 MG tablet [id number] Yes Take 2 tablets (1,000 mg total) by mouth 2 (two) times daily. [name] [name]  Active Self, Pharmacy Records  nitroGLYCERIN  (NITROSTAT ) 0.4 MG SL tablet [id number] Yes Place 1 tablet (0.4 mg total) under the tongue every 5 (five) minutes x 3 doses as needed for chest pain (if no relief after 2nd dose, proceed to ED or call 911). [name] Jayson MATSU, MD  Active Self, Pharmacy Records  NOVOLOG  FLEXPEN 100 UNIT/ML FlexPen [id number] Yes INJECT 48-78 UNITS SUBCUTANEOUSLY TWICE DAILY PER  SLIDING  SCALE  Patient taking differently: INJECT 48-78 UNITS SUBCUTANEOUSLY 2-3 times DAILY PER  SLIDING  SCALE   [name], Mary-Margaret, [name]  Active Self, Pharmacy Records  ondansetron  (ZOFRAN ) 4 MG tablet [id number] Yes Take 1 tablet (4 mg total) by mouth every 8 (eight) hours as needed for nausea or vomiting. Harris, Abigail, PA-C  Active Self, Pharmacy Records  oxyCODONE  (ROXICODONE ) 5 MG immediate release tablet [id number] Yes Take 0.5-1 tablets (2.5-5 mg total) by mouth every 4 (four) hours as needed for severe pain (pain score 7-10). Vann, Jessica U, DO  Active   pantoprazole  (PROTONIX ) 40 MG tablet [id number] Yes Take 1 tablet (40 mg total) by mouth daily as needed (for acid reflux). [name] [name]  Active Self, Pharmacy Records  phenazopyridine  (PYRIDIUM ) 100 MG tablet [id number] Yes Take 1 tablet (100 mg total) by mouth 3 (three) times daily as needed for pain (painful urination). Vann, Jessica U, DO  Active   prasugrel  (EFFIENT ) 10 MG TABS tablet [id number] Yes Take 1 tablet (10 mg total) by mouth daily. Anner Alm ORN, MD  Active Self, Pharmacy Records  saccharomyces boulardii [facility]) 250 MG capsule [id number] Yes Take 1 capsule (250 mg total) by mouth 2 (two) times daily. Vann, Jessica U, DO  Active   Semaglutide , 2 MG/DOSE, (OZEMPIC , 2 MG/DOSE,) 8 MG/3ML SOPN [id number] Yes Inject 2 mg into the skin once a week. [name] [name]  Active Self, Pharmacy Records           Med Note C[place], [name] CINDERELLA [place] Apr 26, 2024  1:10 PM) Sundays  sulfamethoxazole -trimethoprim  (BACTRIM  DS) 800-160 MG tablet [id number] Yes Take 1 tablet by mouth every 12 (twelve) hours. Vann, Jessica U, DO  Active   tamsulosin  (FLOMAX ) 0.4 MG CAPS capsule [id number] Yes Take 1 capsule (0.4 mg total) by mouth 2 (two) times daily. Harris, Abigail, PA-C  Active Self, Pharmacy Records            Recommendation:   Continue Current Plan of Care Patient for follow up on medications - antibiotics and how long indwelling catheter for urology follow up needs.  He will re-schedule appointment for cardiology follow up.  Follow Up Plan:   Telephone follow-up in 1 week  [name] Fish, RN, BSN, CCM [facility], [facility] Health RN Care Manager Direct Dial: [phone]           "

## 2024-04-26 NOTE — Patient Instructions (Signed)
 Visit Information  Thank you for taking time to visit with me today. Please don't hesitate to contact me if I can be of assistance to you before our next scheduled telephone appointment.  Our next appointment is by telephone on 05/05/24 at 11:00 am  Following is a copy of your care plan:   Goals Addressed             This Visit's Progress    VBCI Transitions of Care (TOC) Care Plan       Problems:  Recent Hospitalization for treatment of Sepsis  Knowledge Deficit Related to Sepsis/UTI Need follow up with Urology had called to set up a new 04/26/24  Has had indwelling catheter placed and doesn't know how long he will have it. Urology office was to call him back. Doesn't know when to complete antibiotics, encouraged hospital follow up scheduled 04/27/24 with PCP.  04/26/24 Indwelling catheter; reviewed routine catheter care and s/s of infection.  Goal:  Over the next 30 days, the patient will not experience hospital readmission  Interventions:  Transitions of Care: Doctor Visits  - discussed the importance of doctor visits Referral to Longitudinal Nurse Case Manager for Ongoing follow-up  Patient Self Care Activities:  Attend all scheduled provider appointments Call pharmacy for medication refills 3-7 days in advance of running out of medications Call provider office for new concerns or questions  Notify RN Care Manager of TOC call rescheduling needs Participate in Transition of Care Program/Attend TOC scheduled calls Perform all self care activities independently  Take medications as prescribed   Complete all antibiotics as per ordered Maintain adequate hydration  Plan:  An initial telephone outreach has been scheduled for: 04/26/2024 Follow up with provider re: Poor urine stream, constipation  Next PCP appointment scheduled for: 04/25/2024 Telephone follow up appointment with care management team member scheduled for:04/26/2024 1:00 [name] [name] Patient will keep Urologist  appointment to day at 3:15.  If symptoms worsen patient will call urologist immediately 04/26/24 Patient to keep appointment with PCP re-schedule appointment with urologist and talk with PCP regarding concerns about urology needs and catheter.        Patient verbalizes understanding of instructions and care plan provided today and agrees to view in MyChart. Active MyChart status and patient understanding of how to access instructions and care plan via MyChart confirmed with patient.     The patient has been provided with contact information for the care management team and has been advised to call with any health related questions or concerns.   Please call the care guide team at [phone] if you need to cancel or reschedule your appointment.   Please call the [place]  National Suicide Prevention Lifeline: 660-095-2443 or TTY: 810-411-6939 TTY 570 070 4627) to talk to a trained counselor call the [facility]: ([phone])[phone] call 911 if you are experiencing a Mental Health or Behavioral Health Crisis or need someone to talk to.  [name] Fish, RN, BSN, CCM [facility], [facility] Health RN Care Manager Direct Dial: [phone]

## 2024-04-27 ENCOUNTER — Encounter: Payer: Self-pay | Admitting: Nurse Practitioner

## 2024-04-27 ENCOUNTER — Ambulatory Visit: Admitting: Nurse Practitioner

## 2024-04-27 VITALS — BP 158/56 | HR 75 | Temp 97.2°F | Ht 68.0 in | Wt 245.0 lb

## 2024-04-27 DIAGNOSIS — Z978 Presence of other specified devices: Secondary | ICD-10-CM

## 2024-04-27 DIAGNOSIS — Z789 Other specified health status: Secondary | ICD-10-CM

## 2024-04-27 LAB — BMP8+EGFR
BUN/Creatinine Ratio: 17 (ref 10–24)
BUN: 24 mg/dL (ref 8–27)
CO2: 24 mmol/L (ref 20–29)
Calcium: 9.4 mg/dL (ref 8.6–10.2)
Chloride: 97 mmol/L (ref 96–106)
Creatinine, Ser: 1.38 mg/dL — ABNORMAL HIGH (ref 0.76–1.27)
Glucose: 120 mg/dL — ABNORMAL HIGH (ref 70–99)
Potassium: 6 mmol/L (ref 3.5–5.2)
Sodium: 135 mmol/L (ref 134–144)
eGFR: 55 mL/min/{1.73_m2} — ABNORMAL LOW

## 2024-04-27 NOTE — Progress Notes (Signed)
 "  Subjective:    Patient ID: [name] [name], male    DOB: March 04, 1956, 69 y.o.   MRN: [id number]   Chief Complaint: hospital followup   HPI  Today's visit was for Transitional Care Management.  The patient was discharged from [facility] on 04/14/24 with a primary diagnosis of sepsis.   Contact with the patient and/or caregiver, by a clinical staff member, was made on 04/17/24 and was documented as a telephone encounter within the EMR.  Through chart review and discussion with the patient I have determined that management of their condition is of moderate complexity.   Patient went to the hospital with fever. Dx with UTI that had turned septic. He was discharge on Bactrim  that he is t  take for four weeks. He has already seen urology and they put in a foley but did not tell him what he was suppose to do with it.   Has really lost a lot of fluid since they put foley in. No more ankle edema. No more dyspnea. Weight is down 15lbs. 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   Hx of non-ST elevation myocardial infarction (NSTEMI) 08/19/2020   Accelerating angina (HCC)    Type 2 diabetes mellitus with complication, with long-term current use of insulin  (HCC)    Coronary artery disease involving native coronary artery of native heart with unstable angina pectoris (HCC) 02/07/2020   Bleeds easily 07/20/2019   BMI 36.0-36.9,adult 07/19/2019   Benign prostatic hyperplasia with urinary hesitancy 07/19/2019   Gastroesophageal reflux disease without esophagitis 07/19/2019   Old complex 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  Eyes:  Negative for pain.  Respiratory:  Negative for shortness of breath.   Cardiovascular:  Negative for chest pain, palpitations and leg swelling.  Gastrointestinal:  Negative for abdominal pain.  Endocrine: Negative for polydipsia.  Skin:  Negative for rash.  Neurological:  Negative for dizziness, weakness and headaches.  Hematological:  Does not bruise/bleed easily.  All other systems reviewed and are negative.      Objective:   Physical Exam Constitutional:      Appearance: Normal appearance. He is obese.  Cardiovascular:     Rate and Rhythm: Normal rate and regular rhythm.     Heart sounds: Normal heart sounds.  Pulmonary:     Effort: Pulmonary effort is normal.     Breath sounds: Normal breath sounds.  Skin:    General: Skin is warm.  Neurological:     General: No focal deficit present.     Mental Status: He is alert and oriented to person, place, and time.  Psychiatric:        Mood and Affect: Mood normal.        Behavior: Behavior normal.     BP (!) 158/56   Pulse 75   Temp (!) 97.2 F (36.2 C) (Temporal)   Ht 5' 8 (1.727 m)   Wt 245 lb (111.1 kg)  SpO2 96%   BMI 37.25 kg/m        Assessment & Plan:   [name] in today with chief complaint of Transitions Of Care   1. Transition of care (Primary) Hospital records reviewed  2. Indwelling Foley catheter present Message sent to urology regarding foley catheter- will let patient know when I hear back from them.    The above assessment and management plan was discussed with the patient. The patient verbalized understanding of and has agreed to the management plan. Patient is aware to call the clinic if symptoms persist or worsen. Patient is aware when to return to the clinic for a follow-up visit. Patient educated on when it is appropriate to go to the emergency department.   Mary-Margaret  Gladis, FNP   "

## 2024-04-27 NOTE — Patient Instructions (Signed)
Indwelling Urinary Catheter Bag Care, Adult  An indwelling urinary catheter is a soft tube that is placed into the bladder to help drain pee (urine) out of the body. The catheter is put in through the urethra and is held inside your bladder by a balloon filled with water. The urethra is the part of the body that drains pee from the bladder. Pee drains from the catheter into a drainage bag outside of the body. Taking good care of your catheter, catheter tubing, and the drainage bag will keep your catheter working well. It will also help prevent problems like urinary tract infections (UTIs). What are the risks? Germs (bacteria) may get into your bladder and cause a UTI. The flow of pee can become blocked. This can happen if: The catheter is not connected to the drainage bag correctly. You have sediment or a blood clot in your bladder or catheter. How to wear the catheter and drainage bag Supplies needed: Adhesive tape or a leg strap. If you use tape you will need: Alcohol wipes or soap and water. A clean towel. Overnight drainage bag. Smaller drainage bag (leg bag). Wearing your catheter and bag Use tape or a leg strap to attach the catheter and tubing to your leg. Make sure the catheter is not pulled tight. If a leg strap gets wet, replace it with a dry one. If you use tape: Use an alcohol wipe or soap and water to wash off any stickiness on your skin where you had tape before. Use a clean towel to pat-dry the area. Apply the new tape. You should have received a large overnight drainage bag and a smaller leg bag that fits under clothing. You may wear the overnight bag at any time, but you should not wear the leg bag at night. Make sure the overnight drainage bag is always lower than the level of your bladder. But do not let the bag touch the floor. Before you go to sleep, hang the bag on the side of a wastebasket that is covered by a clean plastic bag. Secure the leg bag according to the  manufacturer's instructions. This may be above or below the knee, depending on the length of the tubing. Make sure that: The leg bag is below your bladder. The tubing does not have loops or too much tension.  How to empty the drainage bag Supplies needed: Rubbing alcohol or alcohol wipes. Gauze pad or cotton ball. Toilet or a clean container. Emptying the bag Empty your drainage bag when it is ?- full, or at least 2-3 times a day. Clean the bag according to the manufacturer's instructions or as told by your health care provider. Wash your hands with soap and water for at least 20 seconds. If soap and water are not available, use hand sanitizer. Hold the drainage bag over the toilet or the clean container used with the drainage bag. Make sure the drainage bag is lower than your hips and bladder. This stops pee from going back into the tubing and into your bladder. Open the pour spout at the bottom of the bag. Empty the pee into the toilet or container. Do not let the pour spout touch any surface. This prevents germs from getting in the bag and causing infection. Use the gauze pad or cotton ball soaked with rubbing alcohol or an alcohol wipe to clean the pour spout. Close the pour spout. Wash your hands again with soap and water for at least 20 seconds. How to change the  drainage bag Supplies needed: Alcohol wipes. A new drainage bag. Adhesive tape or a leg strap. Changing the bag Change your drainage bag twice a day. Also replace your drainage bag with a new bag if it leaks, starts to smell bad, or looks dirty. Be sure to empty your drainage bag before you change bags. Wash your hands with soap and water for at least 20 seconds. If soap and water are not available, use hand sanitizer. Detach the tubing or drainage bag from your skin. Pinch the catheter with your fingers so that pee does not spill out. Disconnect the catheter from the drainage bag tubing at the connection valve. Do not let  the end of the catheter touch any surface. Use an alcohol wipe to clean the end of the catheter and the end of the drainage bag tubing being connected. Connect the catheter to the drainage bag tubing. Attach the bag and tubing to your leg with tape or a leg strap. Avoid attaching the bag too tightly. Wash your hands again with soap and water for at least 20 seconds. General instructions  Always wash your hands for at least 20 seconds before and after you handle your catheter or drainage bag. Use a mild, fragrance-free soap. Never pull on your catheter or try to remove it. Pulling can damage your internal tissues. Always make sure there are no leaks around the catheter or from the drainage bag and tubing. Always make sure there are no twists, bends, or kinks in the catheter or the tubing. Drink enough fluid to keep your pee pale yellow. Do not take baths, swim, or use a hot tub. If you are male, wipe from front to back after having a bowel movement. Contact a health care provider if: Your catheter starts to leak. Your bladder feels full. You have tiredness (lethargy), excessive sleepiness, or confusion. You have signs of a UTI, such as: A fever or chills. Pain in your abdomen, legs, lower back, or bladder. Get help right away if: Your pee is pink or red or you see blood in the catheter. Your pee is not draining into the bag. Your catheter gets pulled out. This information is not intended to replace advice given to you by your health care provider. Make sure you discuss any questions you have with your health care provider. Document Revised: 04/07/2022 Document Reviewed: 01/02/2022 Elsevier Patient Education  2024 [employer].

## 2024-04-28 ENCOUNTER — Other Ambulatory Visit

## 2024-04-28 ENCOUNTER — Ambulatory Visit: Payer: Self-pay | Admitting: Nurse Practitioner

## 2024-04-28 ENCOUNTER — Other Ambulatory Visit: Payer: Self-pay

## 2024-04-28 DIAGNOSIS — E875 Hyperkalemia: Secondary | ICD-10-CM

## 2024-04-29 LAB — BMP8+EGFR
BUN/Creatinine Ratio: 18 (ref 10–24)
BUN: 28 mg/dL — ABNORMAL HIGH (ref 8–27)
CO2: 22 mmol/L (ref 20–29)
Calcium: 9.4 mg/dL (ref 8.6–10.2)
Chloride: 92 mmol/L — ABNORMAL LOW (ref 96–106)
Creatinine, Ser: 1.52 mg/dL — ABNORMAL HIGH (ref 0.76–1.27)
Glucose: 177 mg/dL — ABNORMAL HIGH (ref 70–99)
Potassium: 5.5 mmol/L — ABNORMAL HIGH (ref 3.5–5.2)
Sodium: 131 mmol/L — ABNORMAL LOW (ref 134–144)
eGFR: 49 mL/min/{1.73_m2} — ABNORMAL LOW

## 2024-05-01 ENCOUNTER — Ambulatory Visit: Payer: Self-pay | Admitting: Nurse Practitioner

## 2024-05-04 ENCOUNTER — Other Ambulatory Visit: Payer: Self-pay | Admitting: Urology

## 2024-05-04 ENCOUNTER — Other Ambulatory Visit: Payer: Self-pay | Admitting: Nurse Practitioner

## 2024-05-04 ENCOUNTER — Other Ambulatory Visit

## 2024-05-04 DIAGNOSIS — E875 Hyperkalemia: Secondary | ICD-10-CM

## 2024-05-04 DIAGNOSIS — N401 Enlarged prostate with lower urinary tract symptoms: Secondary | ICD-10-CM

## 2024-05-04 LAB — BMP8+EGFR
BUN/Creatinine Ratio: 18 (ref 10–24)
BUN: 26 mg/dL (ref 8–27)
CO2: 21 mmol/L (ref 20–29)
Calcium: 9.7 mg/dL (ref 8.6–10.2)
Chloride: 97 mmol/L (ref 96–106)
Creatinine, Ser: 1.48 mg/dL — ABNORMAL HIGH (ref 0.76–1.27)
Glucose: 124 mg/dL — ABNORMAL HIGH (ref 70–99)
Potassium: 5.3 mmol/L — ABNORMAL HIGH (ref 3.5–5.2)
Sodium: 136 mmol/L (ref 134–144)
eGFR: 51 mL/min/{1.73_m2} — ABNORMAL LOW

## 2024-05-05 ENCOUNTER — Ambulatory Visit: Payer: Self-pay | Admitting: Nurse Practitioner

## 2024-05-05 ENCOUNTER — Telehealth: Payer: Self-pay

## 2024-05-05 ENCOUNTER — Other Ambulatory Visit: Payer: Self-pay

## 2024-05-08 ENCOUNTER — Inpatient Hospital Stay: Admission: RE | Admit: 2024-05-08

## 2024-05-12 ENCOUNTER — Telehealth

## 2024-08-02 ENCOUNTER — Encounter
